# Patient Record
Sex: Female | Born: 1935
Health system: Southern US, Community
[De-identification: ages and names within clinical notes are randomized; demographics above are authoritative.]

## PROBLEM LIST (undated history)

## (undated) DIAGNOSIS — Z950 Presence of cardiac pacemaker: Secondary | ICD-10-CM

## (undated) DIAGNOSIS — M1120 Other chondrocalcinosis, unspecified site: Secondary | ICD-10-CM

## (undated) DIAGNOSIS — R9439 Abnormal result of other cardiovascular function study: Secondary | ICD-10-CM

## (undated) DIAGNOSIS — M8580 Other specified disorders of bone density and structure, unspecified site: Secondary | ICD-10-CM

## (undated) DIAGNOSIS — I10 Essential (primary) hypertension: Secondary | ICD-10-CM

## (undated) DIAGNOSIS — E7800 Pure hypercholesterolemia, unspecified: Secondary | ICD-10-CM

## (undated) DIAGNOSIS — R200 Anesthesia of skin: Secondary | ICD-10-CM

## (undated) DIAGNOSIS — I341 Nonrheumatic mitral (valve) prolapse: Secondary | ICD-10-CM

## (undated) DIAGNOSIS — IMO0002 Reserved for concepts with insufficient information to code with codable children: Secondary | ICD-10-CM

## (undated) DIAGNOSIS — I779 Disorder of arteries and arterioles, unspecified: Secondary | ICD-10-CM

## (undated) DIAGNOSIS — I739 Peripheral vascular disease, unspecified: Secondary | ICD-10-CM

## (undated) DIAGNOSIS — Q282 Arteriovenous malformation of cerebral vessels: Secondary | ICD-10-CM

## (undated) HISTORY — DX: Disorder of arteries and arterioles, unspecified: I77.9

## (undated) HISTORY — DX: Anesthesia of skin: R20.0

## (undated) HISTORY — DX: Abnormal result of other cardiovascular function study: R94.39

## (undated) HISTORY — DX: Reserved for concepts with insufficient information to code with codable children: IMO0002

## (undated) HISTORY — DX: Other chondrocalcinosis, unspecified site: M11.20

## (undated) HISTORY — DX: Other specified disorders of bone density and structure, unspecified site: M85.80

## (undated) HISTORY — DX: Peripheral vascular disease, unspecified: I73.9

## (undated) HISTORY — PX: OTHER SURGICAL HISTORY: SHX169

## (undated) HISTORY — PX: CARPAL TUNNEL RELEASE: SHX101

## (undated) HISTORY — DX: Essential (primary) hypertension: I10

## (undated) HISTORY — DX: Pure hypercholesterolemia, unspecified: E78.00

## (undated) HISTORY — DX: Arteriovenous malformation of cerebral vessels: Q28.2

## (undated) HISTORY — DX: Nonrheumatic mitral (valve) prolapse: I34.1

## (undated) HISTORY — PX: BREAST MASS EXCISION: SHX1267

## (undated) HISTORY — PX: APPENDECTOMY: SHX54

## (undated) HISTORY — PX: TONSILLECTOMY: SUR1361

---

## 1998-04-01 ENCOUNTER — Other Ambulatory Visit: Admission: RE | Admit: 1998-04-01 | Discharge: 1998-04-01 | Payer: Self-pay

## 1999-04-10 ENCOUNTER — Other Ambulatory Visit: Admission: RE | Admit: 1999-04-10 | Discharge: 1999-04-10 | Payer: Self-pay | Admitting: Internal Medicine

## 1999-10-24 ENCOUNTER — Encounter: Admission: RE | Admit: 1999-10-24 | Discharge: 1999-10-24 | Payer: Self-pay | Admitting: Internal Medicine

## 1999-10-24 ENCOUNTER — Encounter: Payer: Self-pay | Admitting: Internal Medicine

## 2000-05-21 ENCOUNTER — Other Ambulatory Visit: Admission: RE | Admit: 2000-05-21 | Discharge: 2000-05-21 | Payer: Self-pay | Admitting: Internal Medicine

## 2000-11-11 ENCOUNTER — Encounter: Admission: RE | Admit: 2000-11-11 | Discharge: 2000-11-11 | Payer: Self-pay | Admitting: Internal Medicine

## 2000-11-11 ENCOUNTER — Encounter: Payer: Self-pay | Admitting: Internal Medicine

## 2001-07-11 ENCOUNTER — Ambulatory Visit (HOSPITAL_BASED_OUTPATIENT_CLINIC_OR_DEPARTMENT_OTHER): Admission: RE | Admit: 2001-07-11 | Discharge: 2001-07-11 | Payer: Self-pay | Admitting: Orthopedic Surgery

## 2001-11-13 ENCOUNTER — Encounter: Admission: RE | Admit: 2001-11-13 | Discharge: 2001-11-13 | Payer: Self-pay | Admitting: Internal Medicine

## 2001-11-13 ENCOUNTER — Encounter: Payer: Self-pay | Admitting: Internal Medicine

## 2002-12-22 ENCOUNTER — Encounter: Admission: RE | Admit: 2002-12-22 | Discharge: 2002-12-22 | Payer: Self-pay | Admitting: Internal Medicine

## 2002-12-22 ENCOUNTER — Encounter: Payer: Self-pay | Admitting: Internal Medicine

## 2004-03-02 ENCOUNTER — Encounter: Admission: RE | Admit: 2004-03-02 | Discharge: 2004-03-02 | Payer: Self-pay | Admitting: Internal Medicine

## 2004-07-17 ENCOUNTER — Ambulatory Visit: Payer: Self-pay | Admitting: Internal Medicine

## 2004-07-27 ENCOUNTER — Ambulatory Visit: Payer: Self-pay | Admitting: Internal Medicine

## 2004-08-18 ENCOUNTER — Encounter (HOSPITAL_COMMUNITY): Admission: RE | Admit: 2004-08-18 | Discharge: 2004-11-16 | Payer: Self-pay | Admitting: Internal Medicine

## 2004-10-03 ENCOUNTER — Ambulatory Visit (HOSPITAL_COMMUNITY): Admission: RE | Admit: 2004-10-03 | Discharge: 2004-10-03 | Payer: Self-pay | Admitting: Surgery

## 2004-12-19 ENCOUNTER — Ambulatory Visit: Admission: RE | Admit: 2004-12-19 | Discharge: 2004-12-19 | Payer: Self-pay | Admitting: Surgery

## 2005-03-06 ENCOUNTER — Encounter (INDEPENDENT_AMBULATORY_CARE_PROVIDER_SITE_OTHER): Payer: Self-pay | Admitting: *Deleted

## 2005-03-06 ENCOUNTER — Ambulatory Visit (HOSPITAL_COMMUNITY): Admission: RE | Admit: 2005-03-06 | Discharge: 2005-03-07 | Payer: Self-pay | Admitting: Surgery

## 2005-04-27 ENCOUNTER — Encounter: Admission: RE | Admit: 2005-04-27 | Discharge: 2005-04-27 | Payer: Self-pay | Admitting: Internal Medicine

## 2006-04-29 ENCOUNTER — Encounter: Admission: RE | Admit: 2006-04-29 | Discharge: 2006-04-29 | Payer: Self-pay | Admitting: Internal Medicine

## 2006-05-06 ENCOUNTER — Encounter: Admission: RE | Admit: 2006-05-06 | Discharge: 2006-05-06 | Payer: Self-pay | Admitting: Internal Medicine

## 2006-05-14 ENCOUNTER — Encounter: Admission: RE | Admit: 2006-05-14 | Discharge: 2006-05-14 | Payer: Self-pay | Admitting: Internal Medicine

## 2006-05-14 ENCOUNTER — Encounter (INDEPENDENT_AMBULATORY_CARE_PROVIDER_SITE_OTHER): Payer: Self-pay | Admitting: Specialist

## 2006-08-13 ENCOUNTER — Ambulatory Visit (HOSPITAL_BASED_OUTPATIENT_CLINIC_OR_DEPARTMENT_OTHER): Admission: RE | Admit: 2006-08-13 | Discharge: 2006-08-13 | Payer: Self-pay | Admitting: General Surgery

## 2006-08-13 ENCOUNTER — Encounter (INDEPENDENT_AMBULATORY_CARE_PROVIDER_SITE_OTHER): Payer: Self-pay | Admitting: *Deleted

## 2007-02-12 ENCOUNTER — Encounter: Admission: RE | Admit: 2007-02-12 | Discharge: 2007-02-12 | Payer: Self-pay | Admitting: Internal Medicine

## 2007-02-13 ENCOUNTER — Ambulatory Visit: Payer: Self-pay | Admitting: *Deleted

## 2007-05-12 ENCOUNTER — Encounter: Admission: RE | Admit: 2007-05-12 | Discharge: 2007-05-12 | Payer: Self-pay | Admitting: Internal Medicine

## 2007-10-25 ENCOUNTER — Encounter: Admission: RE | Admit: 2007-10-25 | Discharge: 2007-10-25 | Payer: Self-pay | Admitting: Neurosurgery

## 2008-02-04 ENCOUNTER — Ambulatory Visit: Payer: Self-pay | Admitting: Vascular Surgery

## 2008-05-12 ENCOUNTER — Encounter: Admission: RE | Admit: 2008-05-12 | Discharge: 2008-05-12 | Payer: Self-pay | Admitting: Internal Medicine

## 2009-05-13 ENCOUNTER — Encounter: Admission: RE | Admit: 2009-05-13 | Discharge: 2009-05-13 | Payer: Self-pay | Admitting: Internal Medicine

## 2009-07-19 ENCOUNTER — Encounter (INDEPENDENT_AMBULATORY_CARE_PROVIDER_SITE_OTHER): Payer: Self-pay | Admitting: *Deleted

## 2010-02-01 ENCOUNTER — Encounter (INDEPENDENT_AMBULATORY_CARE_PROVIDER_SITE_OTHER): Payer: Self-pay | Admitting: *Deleted

## 2010-03-10 ENCOUNTER — Encounter (INDEPENDENT_AMBULATORY_CARE_PROVIDER_SITE_OTHER): Payer: Self-pay | Admitting: *Deleted

## 2010-03-14 ENCOUNTER — Ambulatory Visit: Payer: Self-pay | Admitting: Internal Medicine

## 2010-03-23 ENCOUNTER — Ambulatory Visit: Payer: Self-pay | Admitting: Internal Medicine

## 2010-05-15 ENCOUNTER — Encounter: Admission: RE | Admit: 2010-05-15 | Discharge: 2010-05-15 | Payer: Self-pay | Admitting: Internal Medicine

## 2010-07-02 ENCOUNTER — Encounter: Payer: Self-pay | Admitting: Internal Medicine

## 2010-07-11 NOTE — Procedures (Signed)
Summary: Colonoscopy  Patient: [name] Note: All result statuses are Final unless otherwise noted.  Tests: (1) Colonoscopy (COL)   COL Colonoscopy           DONE     [facility]     [address]     [place], [place]  [address]           COLONOSCOPY PROCEDURE REPORT           PATIENT:  [name], [name]  MR#:  [id number]     BIRTHDATE:  [birth year], 73 yrs. old  GENDER:  female     ENDOSCOPIST:  Wilhemina Bonito. Eda Keys, MD     REF. BY:  Surveillance Program Recall,     PROCEDURE DATE:  03/23/2010     PROCEDURE:  Surveillance Colonoscopy     ASA CLASS:  Class II     INDICATIONS:  history of pre-cancerous (adenomatous) colon polyps,     surveillance and high-risk screening ; index exam 07-2004 w/ small     adenoma     MEDICATIONS:   Fentanyl 75 mcg IV, Versed 7 mg IV           DESCRIPTION OF PROCEDURE:   After the risks benefits and     alternatives of the procedure were thoroughly explained, informed     consent was obtained.  Digital rectal exam was performed and     revealed moderate external hemorrhoids.   The LB 180AL [id number]     endoscope was introduced through the anus and advanced to the     cecum, which was identified by both the appendix and ileocecal     valve, without limitations.time to cecum  = 4:53 min.  The quality     of the prep was excellent, using MoviPrep.  The instrument was     then slowly withdrawn (time = 8:59 min) as the colon was fully     examined.     <<PROCEDUREIMAGES>>           FINDINGS:  Mild diverticulosis was found in the sigmoid colon.     This was otherwise a normal examination of the colon.  No polyps or     cancers were seen.   Retroflexed views in the rectum revealed     internal hemorrhoids.    The scope was then withdrawn from the     patient and the procedure completed.           COMPLICATIONS:  None     ENDOSCOPIC IMPRESSION:     1) Mild diverticulosis in the sigmoid colon     2) Otherwise normal examination     3) No  polyps or cancers     4) Internal hemorrhoids           RECOMMENDATIONS:     1) Follow up colonoscopy in 5 years           ______________________________     Wilhemina Bonito. Eda Keys, MD           CC:  Geoffry Paradise, MD; The Patient           n.     eSIGNED:   Wilhemina Bonito. Eda Keys at 03/23/2010 09:49 AM           Bertram Denver, [id number]  Note: An exclamation mark (!) indicates a result that was not dispersed into the flowsheet. Document Creation Date: 03/23/2010 9:50 AM _______________________________________________________________________  (1) Order result status: Final Collection  or observation date-time: 03/23/2010 09:44 Requested date-time:  Receipt date-time:  Reported date-time:  Referring Physician:   Ordering Physician: Fransico Setters [phone]) Specimen Source:  Source: Launa Grill Order Number: [id number] Lab site:   Appended Document: Colonoscopy    Clinical Lists Changes  Observations: Added new observation of COLONNXTDUE: 10.2016  (03/23/2010 10:21)

## 2010-07-11 NOTE — Letter (Signed)
Summary: Colonoscopy Letter  [facility]  [address] [place], [place] [address]   Phone: [phone]  Fax: [phone]      July 19, 2009 MRN: [id number]   [name] [address] [place], [place]  [address]   Dear Ms. Kapler,   According to your medical record, it is time for you to schedule a Colonoscopy. The American Cancer Society recommends this procedure as a method to detect early colon cancer. Patients with a family history of colon cancer, or a personal history of colon polyps or inflammatory bowel disease are at increased risk.  This letter has beeen generated based on the recommendations made at the time of your procedure. If you feel that in your particular situation this may no longer apply, please contact our office.  Please call our office at [phone] to schedule this appointment or to update your records at your earliest convenience.  Thank you for cooperating with [place] to provide you with the very best care possible.   Sincerely,  Wilhemina Bonito. Marina Goodell, M.D.  [facility] [phone]

## 2010-07-11 NOTE — Letter (Signed)
Summary: Previsit letter  [facility]  [address] [place], [place] [address]   Phone: [phone]  Fax: [phone]       02/01/2010 MRN: [id number]  [name] [address] [place], [place]  [address]  Dear [name],  Welcome to the Gastroenterology Division at [facility].    You are scheduled to see a nurse for your pre-procedure visit on 03/14/2010 at 10:00am on the 3rd floor at [facility], [address]  We ask that you try to arrive at our office 15 minutes prior to your appointment time to allow for check-in.  Your nurse visit will consist of discussing your medical and surgical history, your immediate family medical history, and your medications.    Please bring a complete list of all your medications or, if you prefer, bring the medication bottles and we will list them.  We will need to be aware of both prescribed and over the counter drugs.  We will need to know exact dosage information as well.  If you are on blood thinners (Coumadin, Plavix, Aggrenox, Ticlid, etc.) please call our office today/prior to your appointment, as we need to consult with your physician about holding your medication.   Please be prepared to read and sign documents such as consent forms, a financial agreement, and acknowledgement forms.  If necessary, and with your consent, a friend or relative is welcome to sit-in on the nurse visit with you.  Please bring your insurance card so that we may make a copy of it.  If your insurance requires a referral to see a specialist, please bring your referral form from your primary care physician.  No co-pay is required for this nurse visit.     If you cannot keep your appointment, please call [phone] to cancel or reschedule prior to your appointment date.  This allows [place] the opportunity to schedule an appointment for another patient in need of care.    Thank you for choosing [facility] for your medical  needs.  We appreciate the opportunity to care for you.  Please visit [place] at our website  to learn more about our practice.                     Sincerely.                                                                                                                   The Gastroenterology Division

## 2010-07-11 NOTE — Letter (Signed)
Summary: [facility] Instructions  [facility]  [address] [place], [place] [address]   Phone: [phone]  Fax: [phone]       KECHIA YAHNKE    1935/08/29    MRN: [id number]        Procedure Day /Date: Thursday 03/23/2010     Arrival Time: 8:00 am      Procedure Time: 9:00 am     Location of Procedure:                    _x _  [facility] (4th Floor)                        PREPARATION FOR COLONOSCOPY WITH MOVIPREP   Starting 5 days prior to your procedure Saturday 10/8 do not eat nuts, seeds, popcorn, corn, beans, peas,  salads, or any raw vegetables.  Do not take any fiber supplements (e.g. Metamucil, Citrucel, and Benefiber).  THE DAY BEFORE YOUR PROCEDURE         DATE: Wednesday 10/12  1.  Drink clear liquids the entire day-NO SOLID FOOD  2.  Do not drink anything colored red or purple.  Avoid juices with pulp.  No orange juice.  3.  Drink at least 64 oz. (8 glasses) of fluid/clear liquids during the day to prevent dehydration and help the prep work efficiently.  CLEAR LIQUIDS INCLUDE: Water Jello Ice Popsicles Tea (sugar ok, no milk/cream) Powdered fruit flavored drinks Coffee (sugar ok, no milk/cream) Gatorade Juice: apple, white grape, white cranberry  Lemonade Clear bullion, consomm, broth Carbonated beverages (any kind) Strained chicken noodle soup Hard Candy                             4.  In the morning, mix first dose of MoviPrep solution:    Empty 1 Pouch A and 1 Pouch B into the disposable container    Add lukewarm drinking water to the top line of the container. Mix to dissolve    Refrigerate (mixed solution should be used within 24 hrs)  5.  Begin drinking the prep at 5:00 p.m. The MoviPrep container is divided by 4 marks.   Every 15 minutes drink the solution down to the next mark (approximately 8 oz) until the full liter is complete.   6.  Follow completed prep with 16 oz of clear liquid of your choice  (Nothing red or purple).  Continue to drink clear liquids until bedtime.  7.  Before going to bed, mix second dose of MoviPrep solution:    Empty 1 Pouch A and 1 Pouch B into the disposable container    Add lukewarm drinking water to the top line of the container. Mix to dissolve    Refrigerate  THE DAY OF YOUR PROCEDURE      DATE: Thursday 10/13  Beginning at 4:00 a.m. (5 hours before procedure):         1. Every 15 minutes, drink the solution down to the next mark (approx 8 oz) until the full liter is complete.  2. Follow completed prep with 16 oz. of clear liquid of your choice.    3. You may drink clear liquids until 7:00 am (2 HOURS BEFORE PROCEDURE).   MEDICATION INSTRUCTIONS  Unless otherwise instructed, you should take regular prescription medications with a small sip of water   as early as possible the morning of your  procedure.           OTHER INSTRUCTIONS  You will need a responsible adult at least 73 years of age to accompany you and drive you home.   This person must remain in the waiting room during your procedure.  Wear loose fitting clothing that is easily removed.  Leave jewelry and other valuables at home.  However, you may wish to bring a book to read or  an iPod/MP3 player to listen to music as you wait for your procedure to start.  Remove all body piercing jewelry and leave at home.  Total time from sign-in until discharge is approximately 2-3 hours.  You should go home directly after your procedure and rest.  You can resume normal activities the  day after your procedure.  The day of your procedure you should not:   Drive   Make legal decisions   Operate machinery   Drink alcohol   Return to work  You will receive specific instructions about eating, activities and medications before you leave.    The above instructions have been reviewed and explained to me by   Ezra Sites RN  March 14, 2010 10:45 AM     I fully understand  and can verbalize these instructions _____________________________ Date _________

## 2010-07-11 NOTE — Miscellaneous (Signed)
Summary: [facility]  Clinical Lists Changes  Medications: Added new medication of MOVIPREP 100 GM  SOLR (PEG-KCL-NACL-NASULF-NA ASC-C) As per prep instructions. - Signed Rx of MOVIPREP 100 GM  SOLR (PEG-KCL-NACL-NASULF-NA ASC-C) As per prep instructions.;  #1 x 0;  Signed;  Entered by: Ezra Sites RN;  Authorized by: Hilarie Fredrickson MD;  Method used: [facility] [address] [phone]*, [address], [place], [place], [place]  [address], Ph: [phone] or [phone], Fax: [phone] Allergies: Added new allergy or adverse reaction of PCN Added new allergy or adverse reaction of * PRAVASTATIN Added new allergy or adverse reaction of * ALEVE Observations: Added new observation of NKA: F (03/14/2010 9:50)    Prescriptions: MOVIPREP 100 GM  SOLR (PEG-KCL-NACL-NASULF-NA ASC-C) As per prep instructions.  #1 x 0   Entered by:   Ezra Sites RN   Authorized by:   Hilarie Fredrickson MD   Signed by:   Ezra Sites RN on 03/14/2010   Method used:   [facility]ron[facility]        [facility] [address] [phone]* (retail)       [address]       [place], [place]  [address]       Ph: [phone] or [phone]       Fax: [phone]   RxID:   [id number]

## 2010-08-30 ENCOUNTER — Other Ambulatory Visit: Payer: Self-pay | Admitting: *Deleted

## 2010-08-30 DIAGNOSIS — I10 Essential (primary) hypertension: Secondary | ICD-10-CM

## 2010-08-30 MED ORDER — NEBIVOLOL HCL 10 MG PO TABS: 10.0000 mg | ORAL_TABLET | Freq: Every day | ORAL | Status: DC

## 2010-10-23 ENCOUNTER — Encounter: Payer: Self-pay | Admitting: Internal Medicine

## 2010-10-24 NOTE — Procedures (Signed)
CAROTID DUPLEX EXAM   INDICATION:  Check bruits, the patient is scheduled for knee surgery.   HISTORY:  Diabetes:  No.  Cardiac:  No.  Hypertension:  No.  Smoking:  Previous.  Previous Surgery:  No.  CV History:  Amaurosis Fugax No, Paresthesias No, Hemiparesis No                                       RIGHT             LEFT  Brachial systolic pressure:         160               156  Brachial Doppler waveforms:         Within normal limits                Within normal limits  Vertebral direction of flow:        Antegrade         Antegrade  DUPLEX VELOCITIES (cm/sec)  CCA peak systolic                   100               92  ECA peak systolic                   405               340  ICA peak systolic                   168               133  ICA end diastolic                   53                50  PLAQUE MORPHOLOGY:                  Calcific          Calcific  PLAQUE AMOUNT:                      Moderate          Moderate  PLAQUE LOCATION:                    ICA/ECA           ICA/ECA   IMPRESSION:  1. 40-59% stenosis of the bilateral internal carotid arteries.  2. Bilateral external carotid artery stenoses noted.  3. Preliminary report was faxed to Dr. Lanell Matar office at 9:45 on      02/04/2008.   ___________________________________________  Larina Earthly, M.D.   CH/MEDQ  D:  02/04/2008  T:  02/04/2008  Job:  [id number]

## 2010-10-27 NOTE — Op Note (Signed)
NAMEZIERRA, LAROQUE              ACCOUNT NO.:  [id number]   MEDICAL RECORD NO.:  [id number]          PATIENT TYPE:  AMB   LOCATION:  DSC                          FACILITY:  [facility]   PHYSICIAN:  Rose Phi. Maple Hudson, M.D.   DATE OF BIRTH:  1935/11/03   DATE OF PROCEDURE:  08/13/2006  DATE OF DISCHARGE:                               OPERATIVE REPORT   PREOPERATIVE DIAGNOSIS:  Right breast mass.   POSTOPERATIVE DIAGNOSIS:  Right breast mass.   OPERATION:  Excision of right breast mass.   SURGEON:  Rose Phi. Maple Hudson, M.D.   ANESTHESIA:  MAC.   OPERATIVE PROCEDURE:  This patient has had previous core biopsy showing  fibrocystic change with some calcifications in what has now turned into  a palpable mass at the 8 o'clock position of the right breast.   The patient placed on the operating table with the arms extended on the  arm board.  The right breast prepped and draped in the usual fashion.  A  curved incision overlying the palpable nodule at about the 8 o'clock  position of the right breast was then outlined with a marking pencil and  the area thoroughly infiltrated with local anesthetic.   Incision was made and the mass and surrounding tissue were excised.  Hemostasis obtained with cautery.  The deeper breast tissue was  approximated with 3-0 Vicryl and the skin with a subcuticular 4-0  Monocryl and then an application of Dermabond.  After it had dried a  light dressing was applied and she was transferred to the recovery room  in satisfactory condition having tolerated procedure well.      Rose Phi. Maple Hudson, M.D.  Electronically Signed     PRY/MEDQ  D:  08/13/2006  T:  08/13/2006  Job:  [id number]

## 2010-10-27 NOTE — Op Note (Signed)
[name], [name] NO.:  [id number]   MEDICAL RECORD NO.:  [id number]          PATIENT TYPE:  INP   LOCATION:  5705                         FACILITY:  [facility]   PHYSICIAN:  Velora Heckler, MD      DATE OF BIRTH:  [birth year]   DATE OF PROCEDURE:  03/06/2005  DATE OF DISCHARGE:                                 OPERATIVE REPORT   PREOPERATIVE DIAGNOSIS:  Primary hyperparathyroidism.   POSTOPERATIVE DIAGNOSES:  1.  Primary hyperparathyroidism.  2.  Right thyroid nodule.   PROCEDURE:  1.  Resection, ectopic left parathyroid adenoma.  2.  Excisional biopsy, right thyroid nodule.   SURGEON:  Velora Heckler, MD.   ASSISTANT:  Claud Kelp, MD   ANESTHESIA:  General.   ESTIMATED BLOOD LOSS:  Minimal.   PREPARATION:  Betadine.   COMPLICATIONS:  None.   INDICATIONS:  The patient is a 75 year old white female from [place],  [place] who presents at the request of Dr. Geoffry Little with  probable primary hyperparathyroidism.  The patient has elevated serum  calcium and parathyroid hormone levels.  She has had problems with  osteopenia and bone and joint pain.  The patient underwent sestamibi scan  which showed increased activity in the upper mediastinum beneath the  manubrium.  MRI scan was performed which documented a 17-mm density in the  anterior mediastinum consistent with parathyroid adenoma.  Also noted was an  inferior right thyroid nodule.  The patient now comes to surgery for neck  exploration.   BODY OF REPORT.:  Procedure is done in OR #1 at the [facility].  The patient is brought to the operating room and placed in a  supine position on the operating room table.  Following the administration  of general anesthesia, the patient is positioned and then prepped and draped  in the usual strict aseptic fashion.  After ascertaining that an adequate  level of anesthesia had been obtained, a transverse anterior neck  incision  was made with a #15 blade.  Dissection was carried down through subcutaneous  tissues and platysma.  Hemostasis was obtained with the electrocautery.  Skin flaps are developed cephalad and caudad.  The strap muscles are then  incised in the midline and dissection carried down to the trachea.  Dissection is carried beneath the manubrium.  Venous tributaries are divided  between medium Ligaclips.  Adipose tissue surrounding the thymus is  dissected out.  It is opened.  Just to the left side of midline, there is a  brownish-black-colored density within the adipose tissue.  This is gently  mobilized.  Vascular tributaries are divided between small and medium  Ligaclips.  Mass is completely excised from the left paratracheal space.  It  has the appearance of a parathyroid adenoma with large dark venous lakes.  It is submitted in its entirety to Pathology.  Dr. Esther Hardy performed  frozen section analysis and confirms parathyroid tissue.  Good hemostasis is  obtained and a small piece of Surgicel is placed in the area of the  resection.   Continuing in  the right neck, strap muscles are reflected laterally.  Right  inferior pole of the thyroid is exposed.  There is approximately a 1.5-cm  nodular density in the inferior right thyroid lobe.  This is gently  mobilized.  Vascular tributaries are divided between medium Ligaclips.  The  thyroid parenchyma is transected between 2-0 silk ligatures.  The nodule is  completely excised off the trachea and submitted to Pathology.  Dr. Esther Hardy also reviewed this on frozen section and felt this was most likely  compatible with hyperplastic thyroid nodule.  Good hemostasis was obtained  and Surgicel was placed in the resection bed.  Strap muscles were  reapproximated in the midline with interrupted 3-0 Vicryl sutures.  Platysma  was closed with interrupted 3-0 Vicryl sutures.  Skin was closed with  running 4-0 Vicryl subcuticular suture.   Wound was washed and dried and  Benzoin and Steri-Strips were applied.  Sterile dressings were applied.  The  patient was awakened from anesthesia and brought to the recovery room in  stable condition.  The patient tolerated the procedure well.      Velora Heckler, MD  Electronically Signed     TMG/MEDQ  D:  03/06/2005  T:  03/07/2005  Job:  [id number]   cc:   [name], M.D.  Fax: [phone]

## 2010-10-27 NOTE — Op Note (Signed)
[place]  Patient:    [name], [name] Visit Number: [id number] MRN: [id number]          Service Type: DSU Location: [facility] Attending Physician:  Susa Day Dictated by:   Katy Fitch Naaman Plummer., M.D. Proc. Date: 07/11/01 Admit Date:  07/11/2001                             Operative Report  PREOPERATIVE DIAGNOSIS:  Entrapment neuropathy, median nerve left carpal tunnel.  POSTOPERATIVE DIAGNOSIS:  Entrapment neuropathy, media nerve left carpal tunnel.  PROCEDURE:  Release of left transverse carpal ligament.  SURGEON:  Katy Fitch. Sypher, Montez Hageman., M.D.  ASSISTANT:  Jonni Sanger, P.A.  ANESTHESIA:  General by LMA supervised by the anesthesiologist, Cliffton Asters. Ivin Booty, M.D.  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  Electrodiagnostic studies have confirmed median neuropathy at the carpal tunnel.  After informed consent and after a failure to respond to nonoperative measures, she is brought to the operating room at this time for release of the left transverse carpal ligament.  DESCRIPTION OF PROCEDURE:  [name] is brought to the operating room and placed in the supine position on the operating table.  Following induction of general anesthesia by LMA, the left arm was prepped with Betadine soap and solution and sterilely draped.  Following exsanguination of the limb with an Esmarch bandage, an arterial tourniquet was placed to 220 mmHg.  The procedure commenced with a short incision in the line of the ring finger in the palm. Subcutaneous tissues were carefully divided to reveal the palmar fascia.  This was split longitudinally to reveal the common sensory branch of the median nerve.  The ligament was then isolated and released on its ulnar border, extending into the distal forearm.  This widely opened  the carpal canal.  No masses or other predicaments were noted.  Bleeding points along the margins of the released ligament were electrocauterized with bipolar current, followed by repair of the skin with intradermal 3-0 Prolene suture.  A compressive dressing was applied with a volar plaster splint maintaining the wrist in 5 degrees of dorsiflexion. Dictated by:   Katy Fitch Naaman Plummer., M.D. Attending Physician:  Susa Day DD:  07/11/01 TD:  07/12/01 Job: [id number]

## 2010-10-30 ENCOUNTER — Encounter: Payer: Self-pay | Admitting: Internal Medicine

## 2010-11-03 ENCOUNTER — Ambulatory Visit: Payer: Self-pay | Admitting: Cardiology

## 2010-11-07 ENCOUNTER — Encounter: Payer: Self-pay | Admitting: Cardiology

## 2010-11-08 ENCOUNTER — Encounter: Payer: Self-pay | Admitting: Cardiology

## 2010-11-08 ENCOUNTER — Ambulatory Visit (INDEPENDENT_AMBULATORY_CARE_PROVIDER_SITE_OTHER): Payer: Medicare Other | Admitting: Cardiology

## 2010-11-08 DIAGNOSIS — R9439 Abnormal result of other cardiovascular function study: Secondary | ICD-10-CM | POA: Insufficient documentation

## 2010-11-08 DIAGNOSIS — E7800 Pure hypercholesterolemia, unspecified: Secondary | ICD-10-CM | POA: Insufficient documentation

## 2010-11-08 DIAGNOSIS — I779 Disorder of arteries and arterioles, unspecified: Secondary | ICD-10-CM

## 2010-11-08 DIAGNOSIS — I059 Rheumatic mitral valve disease, unspecified: Secondary | ICD-10-CM

## 2010-11-08 DIAGNOSIS — I10 Essential (primary) hypertension: Secondary | ICD-10-CM | POA: Insufficient documentation

## 2010-11-08 DIAGNOSIS — I341 Nonrheumatic mitral (valve) prolapse: Secondary | ICD-10-CM | POA: Insufficient documentation

## 2010-11-08 NOTE — Patient Instructions (Signed)
Continue your current therapy.  Stay active.   I will see you back in one year.

## 2010-11-08 NOTE — Assessment & Plan Note (Signed)
Recent carotid Dopplers showed no significant progression of disease.

## 2010-11-08 NOTE — Progress Notes (Signed)
Candie Echevaria Date of Birth: 1935/12/01   History of Present Illness: Mrs. Demary is seen for yearly followup. She states she has done very well this past year. Her blood pressures at home have been well controlled. She did have an upper respiratory infection about 6 weeks ago but this has resolved. She denies any chest pain or shortness of breath. She also reports she had followup carotid Doppler studies which demonstrated no significant change. She denies any chest pain or shortness of breath.  Current Outpatient Prescriptions on File Prior to Visit  Medication Sig Dispense Refill  . Calcium Carbonate-Vitamin D (CALTRATE 600+D PO) Take by mouth 2 (two) times daily.        . fish oil-omega-3 fatty acids 1000 MG capsule Take by mouth 2 (two) times daily. ( 1200 mg )      . glucosamine-chondroitin 500-400 MG tablet Take 1 tablet by mouth 2 (two) times daily.       . Ibuprofen (ADVIL PO) Take by mouth as needed.        . mometasone (ELOCON) 0.1 % cream Apply 1 application topically as needed.        . Multiple Vitamin (MULTIVITAMIN) tablet Take 1 tablet by mouth daily.        . nebivolol (BYSTOLIC) 10 MG tablet Take 1 tablet (10 mg total) by mouth daily.  90 tablet  3  . PHENobarbital (LUMINAL) 32.4 MG tablet Take 32.4 mg by mouth 2 (two) times daily.        . phenytoin (DILANTIN) 100 MG ER capsule Take 100 mg by mouth 2 (two) times daily.        . simvastatin (ZOCOR) 20 MG tablet Take 20 mg by mouth at bedtime.        Marland Kitchen VITAMIN D, ERGOCALCIFEROL, PO Take by mouth once a week.          Allergies  Allergen Reactions  . Naproxen Sodium     REACTION: rash  . Penicillins     REACTION: hives  . Pravastatin   . Sulfa Drugs Cross Reactors     Past Medical History  Diagnosis Date  . Numbness     LEFT FOOT AND ANKLE  . Hypertension   . Hypercholesterolemia   . Carotid arterial disease     MODERATE RIGHT  . Cerebral AV malformation   . DDD (degenerative disc disease)     SEVERE  WITH SPINAL STENOSIS  . Osteopenia   . Pseudo-gout   . MVP (mitral valve prolapse)     MILD    Past Surgical History  Procedure Date  . Carpal tunnel release   . Appendectomy   . Tonsillectomy   . Removal of parathyroid gland   . Breast mass excision   . Stress nuclear study     ABNORMAL    History  Smoking status  . Former Smoker -- 1.0 packs/day for 14 years  . Types: Cigarettes  . Quit date: 11/07/1968  Smokeless tobacco  . Never Used    History  Alcohol Use No    Family History  Problem Relation Age of Onset  . Stroke Mother   . Lung cancer Father     Review of Systems: The review of systems is positive for the fracture toe. She had an upper respiratory infection with persistent cough that is now resolved..  All other systems were reviewed and are negative.  Physical Exam: BP 148/60  Pulse 74  Ht 5\' 6"  (1.676 m)  Wt 148 lb (67.132 kg)  BMI 23.89 kg/m2 She is a pleasant elderly female in no acute distress. HEENT exam is unremarkable. She has no JVD. She has bilateral carotid bruits. Lungs are clear. Cardiac exam reveals a grade 1-2/6 systolic murmur at the left sternal border. Abdomen is soft and nontender. She has no edema. She has excellent pedal pulses. LABORATORY DATA: ECG dated Oct 30, 2010 shows normal sinus rhythm with an incomplete right bundle branch block. There are T wave inversions in leads V1 through V3. Chemistry panel and CBC were remarkable for hemoglobin of 11. Total cholesterol 229, triglycerides 56, HDL 95, and LDL 123. TSH was normal. Apolipoprotein B. Was 65.  Assessment / Plan:

## 2010-11-08 NOTE — Assessment & Plan Note (Signed)
Blood pressure is mildly elevated today but the readings at home have been acceptable. We will continue with her current therapy.

## 2010-11-08 NOTE — Assessment & Plan Note (Signed)
Stress Cardiolite study in April 2010 showed a small reversible anterior wall defect. She had normal ejection fraction of 66%. She has remained asymptomatic so we have not pursued further testing. We will continue with risk factor modification. She will remain on her beta blocker and statin therapy.

## 2011-01-19 ENCOUNTER — Telehealth: Payer: Self-pay | Admitting: Cardiology

## 2011-01-19 NOTE — Telephone Encounter (Signed)
Waiting for release of information to be faxed today per representative

## 2011-01-19 NOTE — Telephone Encounter (Signed)
Representative is asking for last 5 years of records for [name] calling from [employer], Chart pulled and placed on [employer] to be picked up on next Tuesday

## 2011-01-19 NOTE — Telephone Encounter (Signed)
Received release, placed on [employer] Desk to be picked up on Tuesday

## 2011-04-16 ENCOUNTER — Other Ambulatory Visit: Payer: Self-pay | Admitting: Internal Medicine

## 2011-04-16 DIAGNOSIS — Z1231 Encounter for screening mammogram for malignant neoplasm of breast: Secondary | ICD-10-CM

## 2011-05-17 ENCOUNTER — Ambulatory Visit
Admission: RE | Admit: 2011-05-17 | Discharge: 2011-05-17 | Disposition: A | Discharge status: Home / Self Care | Payer: Medicare Other | Source: Ambulatory Visit | Attending: Internal Medicine | Admitting: Internal Medicine

## 2011-05-17 DIAGNOSIS — Z1231 Encounter for screening mammogram for malignant neoplasm of breast: Secondary | ICD-10-CM

## 2011-08-30 DIAGNOSIS — I10 Essential (primary) hypertension: Secondary | ICD-10-CM | POA: Diagnosis not present

## 2011-08-30 DIAGNOSIS — L0201 Cutaneous abscess of face: Secondary | ICD-10-CM | POA: Diagnosis not present

## 2011-08-30 DIAGNOSIS — L03211 Cellulitis of face: Secondary | ICD-10-CM | POA: Diagnosis not present

## 2011-09-03 ENCOUNTER — Telehealth: Payer: Self-pay | Admitting: Cardiology

## 2011-09-03 DIAGNOSIS — I10 Essential (primary) hypertension: Secondary | ICD-10-CM

## 2011-09-03 NOTE — Telephone Encounter (Signed)
Pt needs  rx called [facility] bystolic

## 2011-09-04 MED ORDER — NEBIVOLOL HCL 10 MG PO TABS: 10.0000 mg | ORAL_TABLET | Freq: Every day | ORAL | Status: DC

## 2011-09-27 ENCOUNTER — Other Ambulatory Visit: Payer: Self-pay | Admitting: Dermatology

## 2011-09-27 DIAGNOSIS — D210 Benign neoplasm of connective and other soft tissue of head, face and neck: Secondary | ICD-10-CM | POA: Diagnosis not present

## 2011-09-27 DIAGNOSIS — L819 Disorder of pigmentation, unspecified: Secondary | ICD-10-CM | POA: Diagnosis not present

## 2011-09-27 DIAGNOSIS — D485 Neoplasm of uncertain behavior of skin: Secondary | ICD-10-CM | POA: Diagnosis not present

## 2011-09-27 DIAGNOSIS — Z85820 Personal history of malignant melanoma of skin: Secondary | ICD-10-CM | POA: Diagnosis not present

## 2011-09-27 DIAGNOSIS — L570 Actinic keratosis: Secondary | ICD-10-CM | POA: Diagnosis not present

## 2011-09-27 DIAGNOSIS — L821 Other seborrheic keratosis: Secondary | ICD-10-CM | POA: Diagnosis not present

## 2011-10-30 ENCOUNTER — Encounter: Payer: Self-pay | Admitting: Cardiology

## 2011-10-30 DIAGNOSIS — M899 Disorder of bone, unspecified: Secondary | ICD-10-CM | POA: Diagnosis not present

## 2011-10-30 DIAGNOSIS — I10 Essential (primary) hypertension: Secondary | ICD-10-CM | POA: Diagnosis not present

## 2011-10-30 DIAGNOSIS — E042 Nontoxic multinodular goiter: Secondary | ICD-10-CM | POA: Diagnosis not present

## 2011-10-30 DIAGNOSIS — M949 Disorder of cartilage, unspecified: Secondary | ICD-10-CM | POA: Diagnosis not present

## 2011-10-30 DIAGNOSIS — E785 Hyperlipidemia, unspecified: Secondary | ICD-10-CM | POA: Diagnosis not present

## 2011-11-06 DIAGNOSIS — E042 Nontoxic multinodular goiter: Secondary | ICD-10-CM | POA: Diagnosis not present

## 2011-11-06 DIAGNOSIS — I10 Essential (primary) hypertension: Secondary | ICD-10-CM | POA: Diagnosis not present

## 2011-11-06 DIAGNOSIS — E785 Hyperlipidemia, unspecified: Secondary | ICD-10-CM | POA: Diagnosis not present

## 2011-11-06 DIAGNOSIS — Z0000 Encounter for general adult medical examination without abnormal findings: Secondary | ICD-10-CM | POA: Diagnosis not present

## 2011-11-07 DIAGNOSIS — Z1212 Encounter for screening for malignant neoplasm of rectum: Secondary | ICD-10-CM | POA: Diagnosis not present

## 2011-11-12 ENCOUNTER — Ambulatory Visit (INDEPENDENT_AMBULATORY_CARE_PROVIDER_SITE_OTHER): Payer: Medicare Other | Admitting: Cardiology

## 2011-11-12 ENCOUNTER — Encounter: Payer: Self-pay | Admitting: Cardiology

## 2011-11-12 VITALS — BP 161/74 | HR 78 | Ht 66.0 in | Wt 147.4 lb

## 2011-11-12 DIAGNOSIS — I10 Essential (primary) hypertension: Secondary | ICD-10-CM | POA: Diagnosis not present

## 2011-11-12 DIAGNOSIS — R9439 Abnormal result of other cardiovascular function study: Secondary | ICD-10-CM

## 2011-11-12 DIAGNOSIS — E785 Hyperlipidemia, unspecified: Secondary | ICD-10-CM

## 2011-11-12 DIAGNOSIS — I779 Disorder of arteries and arterioles, unspecified: Secondary | ICD-10-CM

## 2011-11-12 NOTE — Assessment & Plan Note (Signed)
She still remains asymptomatic. While her prior stress test was mildly abnormal it was low risk. We will continue with medical management unless she develops symptoms.

## 2011-11-12 NOTE — Progress Notes (Signed)
[name] Date of Birth: 1936-02-13   History of Present Illness: [name] is seen for yearly followup. She reports that she has done well this past year. She did have a loss in the family that has been very stressful for her this year. Her biggest problem is of chronic back pain. She's had no TIA or CVA symptoms. She denies any chest pain or shortness of breath. She has a history of mildly abnormal cardiovascular stress test in April 2010. This showed a small reversible anterior wall defect. Ejection fraction was normal. She also has a history of some carotid arterial disease but this has been stable.  Current Outpatient Prescriptions on File Prior to Visit  Medication Sig Dispense Refill  . Calcium Carbonate-Vitamin D (CALTRATE 600+D PO) Take by mouth 2 (two) times daily.        . fish oil-omega-3 fatty acids 1000 MG capsule Take by mouth 2 (two) times daily. ( 1200 mg )      . glucosamine-chondroitin 500-400 MG tablet Take 1 tablet by mouth daily.       . Ibuprofen (ADVIL PO) Take by mouth as needed.        . mometasone (ELOCON) 0.1 % cream Apply 1 application topically as needed.        . Multiple Vitamin (MULTIVITAMIN) tablet Take 1 tablet by mouth daily.        . nebivolol (BYSTOLIC) 10 MG tablet Take 1 tablet (10 mg total) by mouth daily.  90 tablet  3  . PHENobarbital (LUMINAL) 32.4 MG tablet Take 32.4 mg by mouth 2 (two) times daily.        . phenytoin (DILANTIN) 100 MG ER capsule Take 100 mg by mouth 2 (two) times daily.        . simvastatin (ZOCOR) 20 MG tablet Take 20 mg by mouth at bedtime.          Allergies  Allergen Reactions  . Naproxen Sodium     REACTION: rash  . Penicillins     REACTION: hives  . Pravastatin   . Sulfa Drugs Cross Reactors     Past Medical History  Diagnosis Date  . Numbness     LEFT FOOT AND ANKLE  . Hypertension   . Hypercholesterolemia   . Carotid arterial disease     MODERATE RIGHT  . Cerebral AV malformation   . DDD  (degenerative disc disease)     SEVERE WITH SPINAL STENOSIS  . Osteopenia   . Pseudo-gout   . MVP (mitral valve prolapse)     MILD  . Abnormal cardiovascular stress test     Past Surgical History  Procedure Date  . Carpal tunnel release   . Appendectomy   . Tonsillectomy   . Removal of parathyroid gland   . Breast mass excision   . Stress nuclear study     ABNORMAL    History  Smoking status  . Former Smoker -- 1.0 packs/day for 14 years  . Types: Cigarettes  . Quit date: 11/07/1968  Smokeless tobacco  . Never Used    History  Alcohol Use No    Family History  Problem Relation Age of Onset  . Stroke Mother   . Lung cancer Father     Review of Systems: The review of systems is positive for chronic back pain. S  All other systems were reviewed and are negative.  Physical Exam: BP 161/74  Pulse 78  Ht 5\' 6"  (1.676 m)  Wt 147 lb 6.4 oz (66.86 kg)  BMI 23.79 kg/m2 She is a pleasant elderly female in no acute distress. HEENT exam is unremarkable. She has no JVD. She has bilateral carotid bruits. Lungs are clear. Cardiac exam reveals a grade 1/6 systolic murmur at the left sternal border. Abdomen is soft and nontender. She has no edema. She has excellent pedal pulses. LABORATORY DATA: Dated 10/30/2011 complete chemistry panel was normal. Hemoglobin was 11.1. Total cholesterol 231, triglycerides 78, HDL 88, LDL 127. Non-HDL cholesterols 143. Apolipoprotein B was 77. Thyroid studies were normal. ECG today is normal.  Assessment / Plan:

## 2011-11-12 NOTE — Patient Instructions (Signed)
Continue your current therapy.  Watch your blood pressure at home.  I will see you again in 1 year.

## 2011-11-12 NOTE — Assessment & Plan Note (Signed)
Blood pressure today is significant elevated but she reports good blood pressure readings elsewhere. She is going to monitor her blood pressure more closely.

## 2012-02-12 DIAGNOSIS — M25519 Pain in unspecified shoulder: Secondary | ICD-10-CM | POA: Diagnosis not present

## 2012-02-19 DIAGNOSIS — S42309A Unspecified fracture of shaft of humerus, unspecified arm, initial encounter for closed fracture: Secondary | ICD-10-CM | POA: Diagnosis not present

## 2012-02-26 DIAGNOSIS — S42309A Unspecified fracture of shaft of humerus, unspecified arm, initial encounter for closed fracture: Secondary | ICD-10-CM | POA: Diagnosis not present

## 2012-03-10 DIAGNOSIS — S42309A Unspecified fracture of shaft of humerus, unspecified arm, initial encounter for closed fracture: Secondary | ICD-10-CM | POA: Diagnosis not present

## 2012-03-12 DIAGNOSIS — S42309A Unspecified fracture of shaft of humerus, unspecified arm, initial encounter for closed fracture: Secondary | ICD-10-CM | POA: Diagnosis not present

## 2012-03-14 DIAGNOSIS — S42309A Unspecified fracture of shaft of humerus, unspecified arm, initial encounter for closed fracture: Secondary | ICD-10-CM | POA: Diagnosis not present

## 2012-03-21 DIAGNOSIS — S42309A Unspecified fracture of shaft of humerus, unspecified arm, initial encounter for closed fracture: Secondary | ICD-10-CM | POA: Diagnosis not present

## 2012-03-28 DIAGNOSIS — S42309A Unspecified fracture of shaft of humerus, unspecified arm, initial encounter for closed fracture: Secondary | ICD-10-CM | POA: Diagnosis not present

## 2012-04-01 DIAGNOSIS — Z23 Encounter for immunization: Secondary | ICD-10-CM | POA: Diagnosis not present

## 2012-04-04 DIAGNOSIS — S42309A Unspecified fracture of shaft of humerus, unspecified arm, initial encounter for closed fracture: Secondary | ICD-10-CM | POA: Diagnosis not present

## 2012-04-18 DIAGNOSIS — M25519 Pain in unspecified shoulder: Secondary | ICD-10-CM | POA: Diagnosis not present

## 2012-04-24 DIAGNOSIS — S42309A Unspecified fracture of shaft of humerus, unspecified arm, initial encounter for closed fracture: Secondary | ICD-10-CM | POA: Diagnosis not present

## 2012-05-01 DIAGNOSIS — S42309A Unspecified fracture of shaft of humerus, unspecified arm, initial encounter for closed fracture: Secondary | ICD-10-CM | POA: Diagnosis not present

## 2012-05-06 DIAGNOSIS — S42309A Unspecified fracture of shaft of humerus, unspecified arm, initial encounter for closed fracture: Secondary | ICD-10-CM | POA: Diagnosis not present

## 2012-05-06 DIAGNOSIS — S5420XA Injury of radial nerve at forearm level, unspecified arm, initial encounter: Secondary | ICD-10-CM | POA: Diagnosis not present

## 2012-05-12 DIAGNOSIS — R569 Unspecified convulsions: Secondary | ICD-10-CM | POA: Diagnosis not present

## 2012-05-12 DIAGNOSIS — M1990 Unspecified osteoarthritis, unspecified site: Secondary | ICD-10-CM | POA: Diagnosis not present

## 2012-05-12 DIAGNOSIS — I10 Essential (primary) hypertension: Secondary | ICD-10-CM | POA: Diagnosis not present

## 2012-05-12 DIAGNOSIS — E042 Nontoxic multinodular goiter: Secondary | ICD-10-CM | POA: Diagnosis not present

## 2012-05-14 DIAGNOSIS — S42309A Unspecified fracture of shaft of humerus, unspecified arm, initial encounter for closed fracture: Secondary | ICD-10-CM | POA: Diagnosis not present

## 2012-05-24 DIAGNOSIS — S42309A Unspecified fracture of shaft of humerus, unspecified arm, initial encounter for closed fracture: Secondary | ICD-10-CM | POA: Diagnosis not present

## 2012-05-29 DIAGNOSIS — S42309A Unspecified fracture of shaft of humerus, unspecified arm, initial encounter for closed fracture: Secondary | ICD-10-CM | POA: Diagnosis not present

## 2012-05-30 DIAGNOSIS — S42309A Unspecified fracture of shaft of humerus, unspecified arm, initial encounter for closed fracture: Secondary | ICD-10-CM | POA: Diagnosis not present

## 2012-06-25 DIAGNOSIS — S42309A Unspecified fracture of shaft of humerus, unspecified arm, initial encounter for closed fracture: Secondary | ICD-10-CM | POA: Diagnosis not present

## 2012-07-04 DIAGNOSIS — S42309A Unspecified fracture of shaft of humerus, unspecified arm, initial encounter for closed fracture: Secondary | ICD-10-CM | POA: Diagnosis not present

## 2012-07-16 DIAGNOSIS — S42309A Unspecified fracture of shaft of humerus, unspecified arm, initial encounter for closed fracture: Secondary | ICD-10-CM | POA: Diagnosis not present

## 2012-09-10 DIAGNOSIS — S42309A Unspecified fracture of shaft of humerus, unspecified arm, initial encounter for closed fracture: Secondary | ICD-10-CM | POA: Diagnosis not present

## 2012-09-30 ENCOUNTER — Other Ambulatory Visit: Payer: Self-pay | Admitting: *Deleted

## 2012-09-30 DIAGNOSIS — S42309A Unspecified fracture of shaft of humerus, unspecified arm, initial encounter for closed fracture: Secondary | ICD-10-CM | POA: Diagnosis not present

## 2012-09-30 DIAGNOSIS — IMO0001 Reserved for inherently not codable concepts without codable children: Secondary | ICD-10-CM | POA: Diagnosis not present

## 2012-09-30 DIAGNOSIS — I10 Essential (primary) hypertension: Secondary | ICD-10-CM

## 2012-09-30 MED ORDER — NEBIVOLOL HCL 10 MG PO TABS: 10.0000 mg | ORAL_TABLET | Freq: Every day | ORAL | Status: DC

## 2012-09-30 NOTE — Telephone Encounter (Signed)
Fax Received. Refill Completed. [name] (R.M.A)   

## 2012-10-02 ENCOUNTER — Encounter: Payer: Self-pay | Admitting: Cardiology

## 2012-10-02 DIAGNOSIS — Z85828 Personal history of other malignant neoplasm of skin: Secondary | ICD-10-CM | POA: Diagnosis not present

## 2012-10-02 DIAGNOSIS — L819 Disorder of pigmentation, unspecified: Secondary | ICD-10-CM | POA: Diagnosis not present

## 2012-10-02 DIAGNOSIS — L821 Other seborrheic keratosis: Secondary | ICD-10-CM | POA: Diagnosis not present

## 2012-10-02 DIAGNOSIS — H61009 Unspecified perichondritis of external ear, unspecified ear: Secondary | ICD-10-CM | POA: Diagnosis not present

## 2012-10-02 DIAGNOSIS — D2370 Other benign neoplasm of skin of unspecified lower limb, including hip: Secondary | ICD-10-CM | POA: Diagnosis not present

## 2012-10-02 DIAGNOSIS — D239 Other benign neoplasm of skin, unspecified: Secondary | ICD-10-CM | POA: Diagnosis not present

## 2012-10-02 DIAGNOSIS — D1801 Hemangioma of skin and subcutaneous tissue: Secondary | ICD-10-CM | POA: Diagnosis not present

## 2012-10-02 DIAGNOSIS — L608 Other nail disorders: Secondary | ICD-10-CM | POA: Diagnosis not present

## 2012-10-31 ENCOUNTER — Encounter: Payer: Self-pay | Admitting: Cardiology

## 2012-10-31 DIAGNOSIS — R82998 Other abnormal findings in urine: Secondary | ICD-10-CM | POA: Diagnosis not present

## 2012-10-31 DIAGNOSIS — E042 Nontoxic multinodular goiter: Secondary | ICD-10-CM | POA: Diagnosis not present

## 2012-10-31 DIAGNOSIS — I10 Essential (primary) hypertension: Secondary | ICD-10-CM | POA: Diagnosis not present

## 2012-10-31 DIAGNOSIS — E785 Hyperlipidemia, unspecified: Secondary | ICD-10-CM | POA: Diagnosis not present

## 2012-10-31 DIAGNOSIS — M949 Disorder of cartilage, unspecified: Secondary | ICD-10-CM | POA: Diagnosis not present

## 2012-11-10 DIAGNOSIS — Z0000 Encounter for general adult medical examination without abnormal findings: Secondary | ICD-10-CM | POA: Diagnosis not present

## 2012-11-10 DIAGNOSIS — I10 Essential (primary) hypertension: Secondary | ICD-10-CM | POA: Diagnosis not present

## 2012-11-10 DIAGNOSIS — R569 Unspecified convulsions: Secondary | ICD-10-CM | POA: Diagnosis not present

## 2012-11-10 DIAGNOSIS — Z23 Encounter for immunization: Secondary | ICD-10-CM | POA: Diagnosis not present

## 2012-11-10 DIAGNOSIS — E042 Nontoxic multinodular goiter: Secondary | ICD-10-CM | POA: Diagnosis not present

## 2012-11-10 DIAGNOSIS — Z1331 Encounter for screening for depression: Secondary | ICD-10-CM | POA: Diagnosis not present

## 2012-11-10 DIAGNOSIS — G609 Hereditary and idiopathic neuropathy, unspecified: Secondary | ICD-10-CM | POA: Diagnosis not present

## 2012-11-10 DIAGNOSIS — E785 Hyperlipidemia, unspecified: Secondary | ICD-10-CM | POA: Diagnosis not present

## 2012-11-10 DIAGNOSIS — M899 Disorder of bone, unspecified: Secondary | ICD-10-CM | POA: Diagnosis not present

## 2012-11-10 DIAGNOSIS — M949 Disorder of cartilage, unspecified: Secondary | ICD-10-CM | POA: Diagnosis not present

## 2012-11-10 DIAGNOSIS — M1990 Unspecified osteoarthritis, unspecified site: Secondary | ICD-10-CM | POA: Diagnosis not present

## 2012-11-13 DIAGNOSIS — Z1212 Encounter for screening for malignant neoplasm of rectum: Secondary | ICD-10-CM | POA: Diagnosis not present

## 2012-11-14 ENCOUNTER — Other Ambulatory Visit: Payer: Self-pay | Admitting: Internal Medicine

## 2012-11-14 DIAGNOSIS — Z1231 Encounter for screening mammogram for malignant neoplasm of breast: Secondary | ICD-10-CM

## 2012-12-01 DIAGNOSIS — IMO0001 Reserved for inherently not codable concepts without codable children: Secondary | ICD-10-CM | POA: Diagnosis not present

## 2012-12-01 DIAGNOSIS — S42309A Unspecified fracture of shaft of humerus, unspecified arm, initial encounter for closed fracture: Secondary | ICD-10-CM | POA: Diagnosis not present

## 2012-12-23 DIAGNOSIS — H52209 Unspecified astigmatism, unspecified eye: Secondary | ICD-10-CM | POA: Diagnosis not present

## 2012-12-23 DIAGNOSIS — Z961 Presence of intraocular lens: Secondary | ICD-10-CM | POA: Diagnosis not present

## 2012-12-30 ENCOUNTER — Ambulatory Visit (INDEPENDENT_AMBULATORY_CARE_PROVIDER_SITE_OTHER): Payer: Medicare Other | Admitting: Cardiology

## 2012-12-30 ENCOUNTER — Encounter: Payer: Self-pay | Admitting: Cardiology

## 2012-12-30 VITALS — BP 158/60 | HR 72 | Ht 66.0 in | Wt 146.8 lb

## 2012-12-30 DIAGNOSIS — I779 Disorder of arteries and arterioles, unspecified: Secondary | ICD-10-CM | POA: Diagnosis not present

## 2012-12-30 DIAGNOSIS — E7800 Pure hypercholesterolemia, unspecified: Secondary | ICD-10-CM | POA: Diagnosis not present

## 2012-12-30 DIAGNOSIS — I10 Essential (primary) hypertension: Secondary | ICD-10-CM | POA: Diagnosis not present

## 2012-12-30 DIAGNOSIS — R9439 Abnormal result of other cardiovascular function study: Secondary | ICD-10-CM

## 2012-12-30 NOTE — Patient Instructions (Signed)
Continue your current therapy  Keep an eye on your blood pressure.  I will see you in one year.

## 2012-12-30 NOTE — Progress Notes (Addendum)
Candie Echevaria Date of Birth: March 15, 1936   History of Present Illness: Mrs. Kosinski is seen for yearly followup. She has had a difficult year. She tripped and fell in September and fractured her right humerus. This resulted in severe nerve damage. She has undergone extensive physical therapy and is now doing much better. She's had no TIA or CVA symptoms. She denies any chest pain or shortness of breath. She has a history of mildly abnormal cardiovascular stress test in April 2010. This showed a small reversible anterior wall defect. Ejection fraction was normal. She also has a history of some carotid arterial disease but this has been stable.  Current Outpatient Prescriptions on File Prior to Visit  Medication Sig Dispense Refill  . Calcium Carbonate-Vitamin D (CALTRATE 600+D PO) Take by mouth 2 (two) times daily.        . Ibuprofen (ADVIL PO) Take by mouth as needed.        . mometasone (ELOCON) 0.1 % cream Apply 1 application topically as needed.        . Multiple Vitamin (MULTIVITAMIN) tablet Take 1 tablet by mouth daily.        . nebivolol (BYSTOLIC) 10 MG tablet Take 1 tablet (10 mg total) by mouth daily.  90 tablet  0  . PHENobarbital (LUMINAL) 32.4 MG tablet Take 32.4 mg by mouth 2 (two) times daily.        . phenytoin (DILANTIN) 100 MG ER capsule Take 100 mg by mouth 2 (two) times daily.        . simvastatin (ZOCOR) 20 MG tablet Take 20 mg by mouth at bedtime.         No current facility-administered medications on file prior to visit.    Allergies  Allergen Reactions  . Naproxen Sodium     REACTION: rash  . Penicillins     REACTION: hives  . Pravastatin   . Sulfa Drugs Cross Reactors     Past Medical History  Diagnosis Date  . Numbness     LEFT FOOT AND ANKLE  . Hypertension   . Hypercholesterolemia   . Carotid arterial disease     MODERATE RIGHT  . Cerebral AV malformation   . DDD (degenerative disc disease)     SEVERE WITH SPINAL STENOSIS  . Osteopenia   .  Pseudo-gout   . MVP (mitral valve prolapse)     MILD  . Abnormal cardiovascular stress test     Past Surgical History  Procedure Laterality Date  . Carpal tunnel release    . Appendectomy    . Tonsillectomy    . Removal of parathyroid gland    . Breast mass excision    . Stress nuclear study      ABNORMAL    History  Smoking status  . Former Smoker -- 1.00 packs/day for 14 years  . Types: Cigarettes  . Quit date: 11/07/1968  Smokeless tobacco  . Never Used    History  Alcohol Use No    Family History  Problem Relation Age of Onset  . Stroke Mother   . Lung cancer Father     Review of Systems: As the history of present illness.  All other systems were reviewed and are negative.  Physical Exam: BP 158/60  Pulse 72  Ht 5\' 6"  (1.676 m)  Wt 146 lb 12.8 oz (66.588 kg)  BMI 23.71 kg/m2  SpO2 99% She is a pleasant elderly female in no acute distress. HEENT exam is unremarkable.  She has no JVD. She has bilateral carotid bruits. Lungs are clear. Cardiac exam reveals a grade 1/6 systolic murmur at the left sternal border. Abdomen is soft and nontender. She has no edema. She has excellent pedal pulses. LABORATORY DATA: Dated 10/31/2012 shows a normal complete chemistry panel and CBC. Total cholesterol 221, triglycerides 41, HDL 83, LDL 1:30. Thyroid function studies were normal. Urinalysis was normal. ECG today demonstrates normal sinus rhythm with left axis deviation. It is otherwise normal. Assessment / Plan: 1. Hypertension-blood pressure is elevated today. She has not been monitoring her blood pressure at home. She is going to start monitoring it and call if it is still significantly elevated. According to the patient her recent blood pressure at her primary care was acceptable.  2. Right humeral fracture with nerve damage. Recovering.  3. Hyperlipidemia.  4. Mildly abnormal stress test in 2010. Unclear if this is artifact. Patient has remained asymptomatic. We'll  continue risk factor modification.

## 2013-01-12 ENCOUNTER — Ambulatory Visit: Payer: Medicare Other

## 2013-01-14 ENCOUNTER — Other Ambulatory Visit: Payer: Self-pay | Admitting: *Deleted

## 2013-01-14 DIAGNOSIS — I10 Essential (primary) hypertension: Secondary | ICD-10-CM

## 2013-01-14 MED ORDER — NEBIVOLOL HCL 10 MG PO TABS: 10.0000 mg | ORAL_TABLET | Freq: Every day | ORAL | Status: DC

## 2013-01-27 DIAGNOSIS — M899 Disorder of bone, unspecified: Secondary | ICD-10-CM | POA: Diagnosis not present

## 2013-02-20 ENCOUNTER — Other Ambulatory Visit: Payer: Self-pay

## 2013-02-20 DIAGNOSIS — I10 Essential (primary) hypertension: Secondary | ICD-10-CM

## 2013-02-20 MED ORDER — NEBIVOLOL HCL 10 MG PO TABS: 10.0000 mg | ORAL_TABLET | Freq: Every day | ORAL | Status: DC

## 2013-03-05 DIAGNOSIS — Z23 Encounter for immunization: Secondary | ICD-10-CM | POA: Diagnosis not present

## 2013-04-29 ENCOUNTER — Other Ambulatory Visit: Payer: Self-pay

## 2013-04-29 DIAGNOSIS — Z1231 Encounter for screening mammogram for malignant neoplasm of breast: Secondary | ICD-10-CM

## 2013-05-14 DIAGNOSIS — G609 Hereditary and idiopathic neuropathy, unspecified: Secondary | ICD-10-CM | POA: Diagnosis not present

## 2013-05-14 DIAGNOSIS — R569 Unspecified convulsions: Secondary | ICD-10-CM | POA: Diagnosis not present

## 2013-05-14 DIAGNOSIS — I10 Essential (primary) hypertension: Secondary | ICD-10-CM | POA: Diagnosis not present

## 2013-05-14 DIAGNOSIS — Q279 Congenital malformation of peripheral vascular system, unspecified: Secondary | ICD-10-CM | POA: Diagnosis not present

## 2013-05-14 DIAGNOSIS — IMO0002 Reserved for concepts with insufficient information to code with codable children: Secondary | ICD-10-CM | POA: Diagnosis not present

## 2013-05-14 DIAGNOSIS — E785 Hyperlipidemia, unspecified: Secondary | ICD-10-CM | POA: Diagnosis not present

## 2013-05-14 DIAGNOSIS — E042 Nontoxic multinodular goiter: Secondary | ICD-10-CM | POA: Diagnosis not present

## 2013-06-02 ENCOUNTER — Ambulatory Visit: Payer: Medicare Other

## 2013-06-08 DIAGNOSIS — J111 Influenza due to unidentified influenza virus with other respiratory manifestations: Secondary | ICD-10-CM | POA: Diagnosis not present

## 2013-06-08 DIAGNOSIS — R05 Cough: Secondary | ICD-10-CM | POA: Diagnosis not present

## 2013-06-08 DIAGNOSIS — I10 Essential (primary) hypertension: Secondary | ICD-10-CM | POA: Diagnosis not present

## 2013-06-08 DIAGNOSIS — R197 Diarrhea, unspecified: Secondary | ICD-10-CM | POA: Diagnosis not present

## 2013-06-08 DIAGNOSIS — IMO0002 Reserved for concepts with insufficient information to code with codable children: Secondary | ICD-10-CM | POA: Diagnosis not present

## 2013-06-23 ENCOUNTER — Ambulatory Visit: Payer: Medicare Other

## 2013-07-14 ENCOUNTER — Ambulatory Visit
Admission: RE | Admit: 2013-07-14 | Discharge: 2013-07-14 | Disposition: A | Discharge status: Home / Self Care | Payer: Medicare Other | Source: Ambulatory Visit

## 2013-07-14 DIAGNOSIS — Z1231 Encounter for screening mammogram for malignant neoplasm of breast: Secondary | ICD-10-CM | POA: Diagnosis not present

## 2013-09-09 DIAGNOSIS — Z01419 Encounter for gynecological examination (general) (routine) without abnormal findings: Secondary | ICD-10-CM | POA: Diagnosis not present

## 2013-09-09 DIAGNOSIS — Z124 Encounter for screening for malignant neoplasm of cervix: Secondary | ICD-10-CM | POA: Diagnosis not present

## 2013-09-23 DIAGNOSIS — D239 Other benign neoplasm of skin, unspecified: Secondary | ICD-10-CM | POA: Diagnosis not present

## 2013-09-23 DIAGNOSIS — H61009 Unspecified perichondritis of external ear, unspecified ear: Secondary | ICD-10-CM | POA: Diagnosis not present

## 2013-09-23 DIAGNOSIS — D1801 Hemangioma of skin and subcutaneous tissue: Secondary | ICD-10-CM | POA: Diagnosis not present

## 2013-09-23 DIAGNOSIS — L821 Other seborrheic keratosis: Secondary | ICD-10-CM | POA: Diagnosis not present

## 2013-09-23 DIAGNOSIS — Z85828 Personal history of other malignant neoplasm of skin: Secondary | ICD-10-CM | POA: Diagnosis not present

## 2013-11-11 DIAGNOSIS — M949 Disorder of cartilage, unspecified: Secondary | ICD-10-CM | POA: Diagnosis not present

## 2013-11-11 DIAGNOSIS — E042 Nontoxic multinodular goiter: Secondary | ICD-10-CM | POA: Diagnosis not present

## 2013-11-11 DIAGNOSIS — R82998 Other abnormal findings in urine: Secondary | ICD-10-CM | POA: Diagnosis not present

## 2013-11-11 DIAGNOSIS — M899 Disorder of bone, unspecified: Secondary | ICD-10-CM | POA: Diagnosis not present

## 2013-11-11 DIAGNOSIS — R809 Proteinuria, unspecified: Secondary | ICD-10-CM | POA: Diagnosis not present

## 2013-11-11 DIAGNOSIS — I10 Essential (primary) hypertension: Secondary | ICD-10-CM | POA: Diagnosis not present

## 2013-11-11 DIAGNOSIS — E785 Hyperlipidemia, unspecified: Secondary | ICD-10-CM | POA: Diagnosis not present

## 2013-11-20 DIAGNOSIS — R569 Unspecified convulsions: Secondary | ICD-10-CM | POA: Diagnosis not present

## 2013-11-20 DIAGNOSIS — M949 Disorder of cartilage, unspecified: Secondary | ICD-10-CM | POA: Diagnosis not present

## 2013-11-20 DIAGNOSIS — Z79899 Other long term (current) drug therapy: Secondary | ICD-10-CM | POA: Diagnosis not present

## 2013-11-20 DIAGNOSIS — E785 Hyperlipidemia, unspecified: Secondary | ICD-10-CM | POA: Diagnosis not present

## 2013-11-20 DIAGNOSIS — G609 Hereditary and idiopathic neuropathy, unspecified: Secondary | ICD-10-CM | POA: Diagnosis not present

## 2013-11-20 DIAGNOSIS — M899 Disorder of bone, unspecified: Secondary | ICD-10-CM | POA: Diagnosis not present

## 2013-11-20 DIAGNOSIS — I10 Essential (primary) hypertension: Secondary | ICD-10-CM | POA: Diagnosis not present

## 2013-11-20 DIAGNOSIS — D649 Anemia, unspecified: Secondary | ICD-10-CM | POA: Diagnosis not present

## 2013-11-20 DIAGNOSIS — E042 Nontoxic multinodular goiter: Secondary | ICD-10-CM | POA: Diagnosis not present

## 2013-11-20 DIAGNOSIS — Z0000 Encounter for general adult medical examination without abnormal findings: Secondary | ICD-10-CM | POA: Diagnosis not present

## 2013-11-23 ENCOUNTER — Ambulatory Visit: Payer: Self-pay | Admitting: Neurology

## 2013-11-24 ENCOUNTER — Ambulatory Visit (INDEPENDENT_AMBULATORY_CARE_PROVIDER_SITE_OTHER): Payer: Medicare Other | Admitting: Neurology

## 2013-11-24 ENCOUNTER — Encounter (INDEPENDENT_AMBULATORY_CARE_PROVIDER_SITE_OTHER): Payer: Self-pay

## 2013-11-24 ENCOUNTER — Encounter: Payer: Self-pay | Admitting: Neurology

## 2013-11-24 VITALS — BP 156/80 | HR 62 | Ht 65.0 in | Wt 143.0 lb

## 2013-11-24 DIAGNOSIS — R269 Unspecified abnormalities of gait and mobility: Secondary | ICD-10-CM | POA: Diagnosis not present

## 2013-11-24 DIAGNOSIS — M545 Low back pain, unspecified: Secondary | ICD-10-CM

## 2013-11-24 NOTE — Progress Notes (Signed)
PATIENT: [name] DOB: 1935-07-03  HISTORICAL  [name] is a 78 years old right-handed female, referred by her primary care physician Dr. Hulan Fray for evaluation of peripheral neuropathy.  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  Last evaluation was by [facility] Dr. Jalene Mullet, most repeat MRI brain was more than 20 years ago.  She also PMHx of HTN, HLD, she also has a history of moderate stenosis at right internal carotid artery by ultrasound Doppler study, repeat evaluation showed no significant change over the years, last evaluation was in 2013.  She complains of bilateral feet paresthesia since 2009, she noticed sudden onset of low back pain, shooting pain to her right leg, that she could not bear weight, was found to have lumbar degenerative disc disease, lumbar stenosis, was given epidural injection, last was 2010.   In 2010, she has EMG/NCS at [facility] for low back pain and bilateral feet paresthesia,  was told that she had left foot tarsal tunnel syndrome, then she noticed right plantar feet numbness, tingling then.  Her bilateral feet paresthesia has slow progressed over the years, she noticed more numbness, still stay at plantar, difficulty abducting her toes.   She also noticed mild gait difficulty, no significant low back pain, no shooting pain to her legs, no bowel and bladder incontinence.    She suffered right radial neuropathy when she suffered right humeral fracture in 2013, after tripped and fell, no surgery required, mild right dorsum hand numbness, no significant right hand weakness now.  She denies neck pain,   We have reviewed MRI lumbar in 2009, Multifactorial significant spinal stenosis noted from the L2-3 through L5 S level. It is difficult to state which level  may be  responsible for the patient's symptoms. The disc protrusion is slightly more prominent on the right at the L2-3 and L5 S1 level and on the left at the L3-4 and L4-5 level.   REVIEW OF SYSTEMS: Full 14 system review of systems performed and notable only for joints pain, allergies, runny nose, numbness, weakness, restless.  ALLERGIES: Allergies  Allergen Reactions  . Naproxen Sodium     REACTION: rash  . Penicillins     REACTION: hives  . Pravastatin   . Sulfa Drugs Cross Reactors     HOME MEDICATIONS: Current Outpatient Prescriptions on File Prior to Visit  Medication Sig Dispense Refill  . Calcium Carbonate-Vitamin D (CALTRATE 600+D PO) Take by mouth daily.       . Ibuprofen (ADVIL PO) Take by mouth as needed.        . mometasone (ELOCON) 0.1 % cream Apply 1 application topically as needed.        . Multiple Vitamin (MULTIVITAMIN) tablet Take 1 tablet by mouth daily.        . nebivolol (BYSTOLIC) 10 MG tablet Take 1 tablet (10 mg total) by mouth daily.  90 tablet  3  . PHENobarbital (LUMINAL) 32.4 MG tablet Take 32.4 mg by mouth daily.       . phenytoin (DILANTIN) 100 MG ER capsule Take 100 mg by mouth daily.       Vladimir Faster Glycol-Propyl Glycol (SYSTANE OP) Apply to eye.      . pyridOXINE (VITAMIN B-6) 50 MG tablet Take 50 mg by mouth daily.      Marland Kitchen  simvastatin (ZOCOR) 20 MG tablet Take 20 mg by mouth at bedtime.           PAST MEDICAL HISTORY: Past Medical History  Diagnosis Date  . Numbness     LEFT FOOT AND ANKLE  . Hypertension   . Hypercholesterolemia   . Carotid arterial disease     MODERATE RIGHT  . Cerebral AV malformation   . DDD (degenerative disc disease)     SEVERE WITH SPINAL STENOSIS  . Osteopenia   . Pseudo-gout   . MVP (mitral valve prolapse)     MILD  . Abnormal cardiovascular stress test     PAST SURGICAL HISTORY: Past Surgical History  Procedure Laterality Date  . Carpal tunnel release    . Appendectomy    . Tonsillectomy    .  Removal of parathyroid gland    . Breast mass excision    . Stress nuclear study      ABNORMAL    FAMILY HISTORY: Family History  Problem Relation Age of Onset  . Stroke Mother   . Lung cancer Father    SOCIAL HISTORY:  History   Social History  . Marital Status: Married    Spouse Name: N/A    Number of Children: 3  . Years of Education: N/A   Occupational History  . Not on file.   Social History Main Topics  . Smoking status: Former Smoker -- 1.00 packs/day for 14 years    Types: Cigarettes    Quit date: 11/07/1968  . Smokeless tobacco: Never Used  . Alcohol Use: No  . Drug Use: No  . Sexual Activity: Not on file   Other Topics Concern  . Retired Education officer, museum   Social History Narrative  . No narrative on file     PHYSICAL EXAM   Filed Vitals:   11/24/13 1315  BP: 156/80  Pulse: 62  Height: 5\' 5"  (1.651 m)  Weight: 143 lb (64.864 kg)    Not recorded    Body mass index is 23.8 kg/(m^2).   Generalized: In no acute distress  Neck: Supple, no carotid bruits   Cardiac: Regular rate rhythm  Pulmonary: Clear to auscultation bilaterally  Musculoskeletal: She has scoliosis, deformed left hand joints,  Neurological examination  Mentation: Alert oriented to time, place, history taking, and causual conversation  Cranial nerve II-XII: Pupils were equal round reactive to light. Extraocular movements were full.  Visual field were full on confrontational test. Bilateral fundi were sharp, static right ptosis.  Facial sensation and strength were normal. Hearing was intact to finger rubbing bilaterally. Uvula tongue midline.  Head turning and shoulder shrug and were normal and symmetric.Tongue protrusion into cheek strength was normal.  Motor: Ltd. range of motion of right shoulder, mild right shoulder abduction, external rotation, elbow flexion, moderate right shoulder extension weakness, also has mild right supination, wrist flexion, finger flexion weakness,  mild bilateral toe extension weakness, right worse than left  Sensory: Iength dependent decreased fine touch, pinprick ankle level, absent toe vibratory sensation  Coordination: Normal finger to nose, heel-to-shin bilaterally there was no truncal ataxia  Gait: Mildly unsteady, cautious gait, scoliosis, upper body lean  towards the left side, could not stand up on tiptoe, or heels,  Romberg signs: Negative  Deep tendon reflexes: Brachioradialis 2/2, biceps 2/2, triceps 2/2, patellar 2/2, Achilles absent, plantar responses were flexor bilaterally.   DIAGNOSTIC DATA (LABS, IMAGING, TESTING) - I reviewed patient records, labs, notes, testing and imaging myself where available.  ASSESSMENT  AND PLAN  [name] is a 78 y.o. female with known history of severe multilevel lumbar stenosis, now presenting with worsening bilateral feet paresthesia, six-month history of worsening gait difficulty, also suffered right humeral fracture, with evidence of  right radial neuropathy, on examination, she has scoliosis, mild distal leg weakness, length dependent sensory changes,  Consistent with her history of lumbar stenosis, likely superimposed peripheral neuropathy, MRI lumbar EMG nerve conduction study Laboratory evaluation from her primary care Return to clinic in one months    Marcial Pacas, M.D. Ph.D.  [facility] [address] [place], [place] [address] [phone]

## 2013-11-26 DIAGNOSIS — Z1212 Encounter for screening for malignant neoplasm of rectum: Secondary | ICD-10-CM | POA: Diagnosis not present

## 2013-12-25 ENCOUNTER — Ambulatory Visit: Payer: Medicare Other | Admitting: Neurology

## 2013-12-28 DIAGNOSIS — H02409 Unspecified ptosis of unspecified eyelid: Secondary | ICD-10-CM | POA: Diagnosis not present

## 2013-12-28 DIAGNOSIS — H52209 Unspecified astigmatism, unspecified eye: Secondary | ICD-10-CM | POA: Diagnosis not present

## 2013-12-28 DIAGNOSIS — Z961 Presence of intraocular lens: Secondary | ICD-10-CM | POA: Diagnosis not present

## 2013-12-29 ENCOUNTER — Ambulatory Visit
Admission: RE | Admit: 2013-12-29 | Discharge: 2013-12-29 | Disposition: A | Discharge status: Home / Self Care | Payer: Medicare Other | Source: Ambulatory Visit | Attending: Neurology | Admitting: Neurology

## 2013-12-29 DIAGNOSIS — M545 Low back pain, unspecified: Secondary | ICD-10-CM | POA: Diagnosis not present

## 2013-12-29 DIAGNOSIS — R269 Unspecified abnormalities of gait and mobility: Secondary | ICD-10-CM | POA: Diagnosis not present

## 2014-01-04 ENCOUNTER — Telehealth: Payer: Self-pay | Admitting: Neurology

## 2014-01-04 NOTE — Telephone Encounter (Signed)
I will review her MRI lumbar findings in her July 30 appt.

## 2014-01-05 ENCOUNTER — Ambulatory Visit: Payer: Medicare Other | Admitting: Cardiology

## 2014-01-07 ENCOUNTER — Ambulatory Visit (INDEPENDENT_AMBULATORY_CARE_PROVIDER_SITE_OTHER): Payer: Medicare Other | Admitting: Neurology

## 2014-01-07 ENCOUNTER — Encounter (INDEPENDENT_AMBULATORY_CARE_PROVIDER_SITE_OTHER): Payer: Self-pay

## 2014-01-07 DIAGNOSIS — Z0289 Encounter for other administrative examinations: Secondary | ICD-10-CM

## 2014-01-07 DIAGNOSIS — M545 Low back pain, unspecified: Secondary | ICD-10-CM

## 2014-01-07 DIAGNOSIS — R269 Unspecified abnormalities of gait and mobility: Secondary | ICD-10-CM

## 2014-01-07 DIAGNOSIS — M48061 Spinal stenosis, lumbar region without neurogenic claudication: Secondary | ICD-10-CM

## 2014-01-07 DIAGNOSIS — R209 Unspecified disturbances of skin sensation: Secondary | ICD-10-CM | POA: Diagnosis not present

## 2014-01-07 NOTE — Progress Notes (Signed)
PATIENT: MADDISON KILNER DOB: [birth year]  HISTORICAL  REVE CROCKET is a 78 years old right-handed female, referred by her primary care physician Dr. Hulan Fray for evaluation of peripheral neuropathy.  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  Last evaluation was by [facility] Dr. Jalene Mullet, most repeat MRI brain was more than 20 years ago.  She also PMHx of HTN, HLD, she also has a history of moderate stenosis at right internal carotid artery by ultrasound Doppler study, repeat evaluation showed no significant change over the years, last evaluation was in 2013.  She complains of bilateral feet paresthesia since 2009, she noticed sudden onset of low back pain, shooting pain to her right leg, that she could not bear weight, was found to have lumbar degenerative disc disease, lumbar stenosis, was given epidural injection, last was 2010.   In 2010, she has EMG/NCS at [facility] for low back pain and bilateral feet paresthesia,  was told that she had left foot tarsal tunnel syndrome, then she noticed right plantar feet numbness, tingling then.  Her bilateral feet paresthesia has slow progressed over the years, she noticed more numbness, still stay at plantar, difficulty abducting her toes.   She also noticed mild gait difficulty, no significant low back pain, no shooting pain to her legs, no bowel and bladder incontinence.    She suffered right radial neuropathy when she suffered right humeral fracture in 2013, after tripped and fell, no surgery required, mild right dorsum hand numbness, no significant right hand weakness now.  She denies neck pain,   We have reviewed MRI lumbar in 2009, Multifactorial significant spinal stenosis noted from the L2-3 through L5 S level. It is difficult to state which level  may be  responsible for the patient's symptoms. The disc protrusion is slightly more prominent on the right at the L2-3 and L5 S1 level and on the left at the L3-4 and L4-5 level.  UPDATE July 30th 2015:  We have reviewed MRI lumbarAbnormal MRI lumbar spine (without) demonstrating:  1. Severe spinal stenosis from L2-3 to L5-S1.  2. Moderate spinal stenosis at L1-2.  3. Multi-level foraminal stenosis from L1-2 to L5-S1.  4. Right renal cyst 1.9cm.  5. Compared to prior MRI on 10/25/07, there has been mild progression of degenerative spine disease. Also, right renal cyst has enlarged from 1.0 to 1.9 cm in diameter.  Electrodiagnostic study January 07 2014: has demonstrated list dependent mild axonal peripheral neuropathy, there was also evidence of chronic neuropathic changes humanity right L5-S1 myotomes, consistent with his right chronic lumbar radiculopathy   She has mild unsteady gait, the most bothersome symptoms is bilateral feet numbness, she denies bowel bladder incontinence,  After discussion, I suggested continued conservative treatment, defer neurosurgical evaluation for her multilevel lumbar spinal stenosis  Laboratory evaluation revealed a normal CMP, anemia hemoglobin 10. 2, this is under supervision of her primary care physician Dr.Aroson,  LDL 113 normal TSH, vitamin D 40.7   REVIEW OF SYSTEMS: Full 14 system review of systems performed and notable only for joints pain, allergies, runny nose, numbness, weakness, restless.  ALLERGIES: Allergies  Allergen Reactions  . Naproxen Sodium     REACTION: rash  . Penicillins     REACTION: hives  . Pravastatin   . Sulfa Drugs Cross Reactors  HOME MEDICATIONS: Current Outpatient Prescriptions on File Prior to Visit  Medication Sig Dispense Refill  . Calcium Carbonate-Vitamin D (CALTRATE 600+D PO) Take by mouth daily.       . Ibuprofen (ADVIL PO) Take by mouth as needed.        . mometasone (ELOCON) 0.1 % cream Apply 1  application topically as needed.        . Multiple Vitamin (MULTIVITAMIN) tablet Take 1 tablet by mouth daily.        . nebivolol (BYSTOLIC) 10 MG tablet Take 1 tablet (10 mg total) by mouth daily.  90 tablet  3  . PHENobarbital (LUMINAL) 32.4 MG tablet Take 32.4 mg by mouth daily.       . phenytoin (DILANTIN) 100 MG ER capsule Take 100 mg by mouth daily.       Vladimir Faster Glycol-Propyl Glycol (SYSTANE OP) Apply to eye.      . pyridOXINE (VITAMIN B-6) 50 MG tablet Take 50 mg by mouth daily.      . simvastatin (ZOCOR) 20 MG tablet Take 20 mg by mouth at bedtime.           PAST MEDICAL HISTORY: Past Medical History  Diagnosis Date  . Numbness     LEFT FOOT AND ANKLE  . Hypertension   . Hypercholesterolemia   . Carotid arterial disease     MODERATE RIGHT  . Cerebral AV malformation   . DDD (degenerative disc disease)     SEVERE WITH SPINAL STENOSIS  . Osteopenia   . Pseudo-gout   . MVP (mitral valve prolapse)     MILD  . Abnormal cardiovascular stress test     PAST SURGICAL HISTORY: Past Surgical History  Procedure Laterality Date  . Carpal tunnel release    . Appendectomy    . Tonsillectomy    . Removal of parathyroid gland    . Breast mass excision    . Stress nuclear study      ABNORMAL    FAMILY HISTORY: Family History  Problem Relation Age of Onset  . Stroke Mother   . Lung cancer Father    SOCIAL HISTORY:  History   Social History  . Marital Status: Married    Spouse Name: N/A    Number of Children: 3  . Years of Education: N/A   Occupational History  . Not on file.   Social History Main Topics  . Smoking status: Former Smoker -- 1.00 packs/day for 14 years    Types: Cigarettes    Quit date: 11/07/1968  . Smokeless tobacco: Never Used  . Alcohol Use: No  . Drug Use: No  . Sexual Activity: Not on file   Other Topics Concern  . Retired Education officer, museum   Social History Narrative  . No narrative on file     PHYSICAL EXAM   There were no  vitals filed for this visit.  Not recorded    There is no weight on file to calculate BMI.   Generalized: In no acute distress  Neck: Supple, no carotid bruits   Cardiac: Regular rate rhythm  Pulmonary: Clear to auscultation bilaterally  Musculoskeletal: She has scoliosis, deformed left hand joints,  Neurological examination  Mentation: Alert oriented to time, place, history taking, and causual conversation  Cranial nerve II-XII: Pupils were equal round reactive to light. Extraocular movements were full.  Visual field were full on confrontational test. Bilateral fundi were sharp, static right ptosis.  Facial sensation and strength were normal. Hearing  was intact to finger rubbing bilaterally. Uvula tongue midline.  Head turning and shoulder shrug and were normal and symmetric.Tongue protrusion into cheek strength was normal.  Motor: Ltd. range of motion of right shoulder, mild right shoulder abduction, external rotation, elbow flexion, moderate right shoulder extension weakness, also has mild right supination, wrist flexion, finger flexion weakness, mild bilateral toe extension weakness, right worse than left  Sensory: Iength dependent decreased fine touch, pinprick ankle level, absent toe vibratory sensation  Coordination: Normal finger to nose, heel-to-shin bilaterally there was no truncal ataxia  Gait: Mildly unsteady, cautious gait, scoliosis, upper body lean  towards the left side, could not stand up on tiptoe, or heels,  Romberg signs: Negative  Deep tendon reflexes: Brachioradialis 2/2, biceps 2/2, triceps 2/2, patellar 2/2, Achilles absent, plantar responses were flexor bilaterally.   DIAGNOSTIC DATA (LABS, IMAGING, TESTING) - I reviewed patient records, labs, notes, testing and imaging myself where available.  ASSESSMENT AND PLAN  DARRELLE BARRELL is a 78 y.o. female with known history of severe multilevel lumbar stenosis, now presenting with worsening bilateral feet  paresthesia, six-month history of worsening gait difficulty, also suffered right humeral fracture, with evidence of  right radial neuropathy, on examination, she has scoliosis, mild distal leg weakness, length dependent sensory changes,  Consistent with  lumbar stenosis with superimposed peripheral neuropathy, with EMG nerve conduction study, and MRI findings detailed above  We decided to continue observe her symptoms, defer neurosurgical referral at this point,  Return to clinic in 3 months.    Marcial Pacas, M.D. Ph.D.  [facility] [address], [place], [place] [address] [phone]

## 2014-01-07 NOTE — Procedures (Signed)
   NCS (NERVE CONDUCTION STUDY) WITH EMG (ELECTROMYOGRAPHY) REPORT   STUDY DATE: January 07 2014 PATIENT NAME: [name] DOB: [birth year] MRN: [id number]    TECHNOLOGIST: Vear Clock ELECTROMYOGRAPHER: Marcial Pacas M.D.  CLINICAL INFORMATION: 78 year old female, with chronic low back pain, progressive worsening bilateral feet paresthesia, mild unsteady gait  On examination, she has mild bilateral toe extension weakness, there was no significant ankle plantarflexion, ankle dorsiflexion weakness. Hyporeflexia, absent ankle reflexes  FINDINGS: NERVE CONDUCTION STUDY: Bilateral peroneal sensory responses were absent. Bilateral peroneal, tibial responses showed moderately decreased C. map amplitude, preserved distal latency, conduction velocity. Bilateral tibial H. reflexes with absent.  Right median, ulnar, median sensory responses were normal  Right median, and ulnar motor responses were normal  NEEDLE ELECTROMYOGRAPHY: Selected needle examination was performed at right lower extremity muscles, right lumbosacral paraspinal muscles  Right tibialis posterior, tibialis anterior: Normally insertion activity, no spontaneous activity, enlarged mildly complex motor unit potential, with mildly decreased recruitment patterns.  Right vastus lateralis, biceps femoris short head, biceps femoris long head: Normally insertion activity, no spontaneous activity, enlarged complex motor unit potential, with mildly decreased recruitment patterns.  Right gluteus medius, Normally insertion activity, no spontaneous activity, enlarged mildly complex motor unit potential, with mildly decreased recruitment patterns.  There was no spontaneous activity at right lumbosacral paraspinal muscles, right L4-5 S1    IMPRESSION:   This is an abnormal study. There is electrodiagnostic evidence of mild length dependent axonal peripheral neuropathy, there was also evidence of chronic right lumbar radiculopathy,  involving right L4, L5, S1 consistent with chronic right lumbosacral radiculopathy.   INTERPRETING PHYSICIAN:   Marcial Pacas M.D. Ph.D. [facility] [address], [place], [place] [address] [phone]

## 2014-01-11 DIAGNOSIS — R82998 Other abnormal findings in urine: Secondary | ICD-10-CM | POA: Diagnosis not present

## 2014-01-11 DIAGNOSIS — R300 Dysuria: Secondary | ICD-10-CM | POA: Diagnosis not present

## 2014-02-03 ENCOUNTER — Ambulatory Visit: Payer: Medicare Other | Admitting: Neurology

## 2014-02-25 DIAGNOSIS — Z23 Encounter for immunization: Secondary | ICD-10-CM | POA: Diagnosis not present

## 2014-03-09 ENCOUNTER — Ambulatory Visit (INDEPENDENT_AMBULATORY_CARE_PROVIDER_SITE_OTHER): Payer: Medicare Other | Admitting: Cardiology

## 2014-03-09 ENCOUNTER — Encounter: Payer: Self-pay | Admitting: Cardiology

## 2014-03-09 VITALS — BP 156/62 | HR 80 | Ht 66.0 in | Wt 143.1 lb

## 2014-03-09 DIAGNOSIS — I10 Essential (primary) hypertension: Secondary | ICD-10-CM

## 2014-03-09 DIAGNOSIS — R9439 Abnormal result of other cardiovascular function study: Secondary | ICD-10-CM

## 2014-03-09 DIAGNOSIS — I779 Disorder of arteries and arterioles, unspecified: Secondary | ICD-10-CM | POA: Diagnosis not present

## 2014-03-09 DIAGNOSIS — I739 Peripheral vascular disease, unspecified: Secondary | ICD-10-CM

## 2014-03-09 MED ORDER — CARVEDILOL 12.5 MG PO TABS: 12.5000 mg | ORAL_TABLET | Freq: Two times a day (BID) | ORAL | Status: DC

## 2014-03-09 NOTE — Patient Instructions (Signed)
We will switch Bystolic to Carvedilol 00.8 mg twice a day

## 2014-03-10 NOTE — Progress Notes (Signed)
[name] Date of Birth: 1936/05/26   History of Present Illness: [name] is seen for yearly followup. She has a history of mildly abnormal myoview study in 2010. She has been treated conservatively and has done well.  She denies any chest pain or shortness of breath.  She also has a history of some carotid arterial disease but this has been stable.She states her Bystolic cost has increased to over $100/month.   Current Outpatient Prescriptions on File Prior to Visit  Medication Sig Dispense Refill  . Calcium Carbonate-Vitamin D (CALTRATE 600+D PO) Take by mouth daily.       . cholecalciferol (VITAMIN D) 1000 UNITS tablet Take 1,000 Units by mouth once a week.      . Ibuprofen (ADVIL PO) Take by mouth as needed.        . mometasone (ELOCON) 0.1 % cream Apply 1 application topically as needed.        . Multiple Vitamin (MULTIVITAMIN) tablet Take 1 tablet by mouth daily.        Marland Kitchen PHENobarbital (LUMINAL) 32.4 MG tablet Take 32.4 mg by mouth daily.       . phenytoin (DILANTIN) 100 MG ER capsule Take 100 mg by mouth daily.       Marland Kitchen pyridOXINE (VITAMIN B-6) 50 MG tablet Take 50 mg by mouth daily.      . simvastatin (ZOCOR) 20 MG tablet Take 20 mg by mouth at bedtime.         No current facility-administered medications on file prior to visit.    Allergies  Allergen Reactions  . Naproxen Sodium     REACTION: rash  . Penicillins     REACTION: hives  . Pravastatin   . Sulfa Drugs Cross Reactors     Past Medical History  Diagnosis Date  . Numbness     LEFT FOOT AND ANKLE  . Hypertension   . Hypercholesterolemia   . Carotid arterial disease     MODERATE RIGHT  . Cerebral AV malformation   . DDD (degenerative disc disease)     SEVERE WITH SPINAL STENOSIS  . Osteopenia   . Pseudo-gout   . MVP (mitral valve prolapse)     MILD  . Abnormal cardiovascular stress test     Past Surgical History  Procedure Laterality Date  . Carpal tunnel release    . Appendectomy    .  Tonsillectomy    . Removal of parathyroid gland    . Breast mass excision    . Stress nuclear study      ABNORMAL    History  Smoking status  . Former Smoker -- 1.00 packs/day for 14 years  . Types: Cigarettes  . Quit date: 11/07/1968  Smokeless tobacco  . Never Used    History  Alcohol Use No    Family History  Problem Relation Age of Onset  . Stroke Mother   . Lung cancer Father     Review of Systems: As the history of present illness.  All other systems were reviewed and are negative.  Physical Exam: BP 156/62  Pulse 80  Ht 5\' 6"  (1.676 m)  Wt 143 lb 1.6 oz (64.91 kg)  BMI 23.11 kg/m2 She is a pleasant elderly female in no acute distress. HEENT exam is unremarkable. She has no JVD. She has bilateral carotid bruits. Lungs are clear. Cardiac exam reveals a grade 1/6 systolic murmur at the left sternal border. Abdomen is soft and nontender. She has no  edema. She has excellent pedal pulses. LABORATORY DATA: Dated 11/11/13 shows a normal complete chemistry panel. Hgb 10.2.  Total cholesterol 203, triglycerides 50, HDL 80, LDL 113. Thyroid function studies were normal.  ECG today demonstrates normal sinus rhythm with LAE.  It is otherwise normal.  Assessment / Plan: 1. Hypertension-blood pressure is elevated today. She can no longer afford Bystolic. We will switch her to Carvedilol and she is going to monitor her BP more closely at home.   2.  Hyperlipidemia.  3. Mildly abnormal stress test in 2010. Unclear if this is artifact. Patient has remained asymptomatic. We'll continue risk factor modification.

## 2014-06-07 ENCOUNTER — Ambulatory Visit: Payer: Medicare Other | Admitting: Neurology

## 2014-06-09 ENCOUNTER — Ambulatory Visit: Payer: Medicare Other | Admitting: Neurology

## 2014-06-21 DIAGNOSIS — J209 Acute bronchitis, unspecified: Secondary | ICD-10-CM | POA: Diagnosis not present

## 2014-06-21 DIAGNOSIS — Z6822 Body mass index (BMI) 22.0-22.9, adult: Secondary | ICD-10-CM | POA: Diagnosis not present

## 2014-06-21 DIAGNOSIS — R05 Cough: Secondary | ICD-10-CM | POA: Diagnosis not present

## 2014-06-24 ENCOUNTER — Ambulatory Visit: Payer: Medicare Other | Admitting: Neurology

## 2014-07-14 ENCOUNTER — Other Ambulatory Visit: Payer: Self-pay

## 2014-07-14 DIAGNOSIS — Z1231 Encounter for screening mammogram for malignant neoplasm of breast: Secondary | ICD-10-CM

## 2014-07-28 ENCOUNTER — Ambulatory Visit (INDEPENDENT_AMBULATORY_CARE_PROVIDER_SITE_OTHER): Payer: Medicare Other | Admitting: Neurology

## 2014-07-28 ENCOUNTER — Encounter: Payer: Self-pay | Admitting: Neurology

## 2014-07-28 VITALS — BP 183/76 | HR 92 | Ht 66.0 in | Wt 143.0 lb

## 2014-07-28 DIAGNOSIS — G40209 Localization-related (focal) (partial) symptomatic epilepsy and epileptic syndromes with complex partial seizures, not intractable, without status epilepticus: Secondary | ICD-10-CM | POA: Diagnosis not present

## 2014-07-28 DIAGNOSIS — M4806 Spinal stenosis, lumbar region: Secondary | ICD-10-CM | POA: Diagnosis not present

## 2014-07-28 DIAGNOSIS — M48061 Spinal stenosis, lumbar region without neurogenic claudication: Secondary | ICD-10-CM

## 2014-07-28 DIAGNOSIS — R269 Unspecified abnormalities of gait and mobility: Secondary | ICD-10-CM | POA: Diagnosis not present

## 2014-07-28 NOTE — Progress Notes (Signed)
PATIENT: [name] DOB: [birth year]  HISTORICAL  [name] is a 79 years old right-handed female, referred by her primary care physician Dr. Hulan Fray for evaluation of peripheral neuropathy.  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  Last evaluation was by [facility] Dr. Jalene Mullet, most repeat MRI brain was more than 20 years ago.  She also PMHx of HTN, HLD, she also has a history of moderate stenosis at right internal carotid artery by ultrasound Doppler study, repeat evaluation showed no significant change over the years, last evaluation was in 2013.  She complains of bilateral feet paresthesia since 2009, she noticed sudden onset of low back pain, shooting pain to her right leg, that she could not bear weight, was found to have lumbar degenerative disc disease, lumbar stenosis, was given epidural injection, last was 2010.   In 2010, she has EMG/NCS at [facility] for low back pain and bilateral feet paresthesia,  was told that she had left foot tarsal tunnel syndrome, then she noticed right plantar feet numbness, tingling then.  Her bilateral feet paresthesia has slow progressed over the years, she noticed more numbness, still stay at plantar, difficulty abducting her toes.   She also noticed mild gait difficulty, no significant low back pain, no shooting pain to her legs, no bowel and bladder incontinence.    She suffered right radial neuropathy when she suffered right humeral fracture in 2013, after tripped and fell, no surgery required, mild right dorsum hand numbness, no significant right hand weakness now.  She denies neck pain,   We have reviewed MRI lumbar in 2009, Multifactorial significant spinal stenosis noted from the L2-3 through L5 S level. It is difficult to state which level  may be  responsible for the patient's symptoms. The disc protrusion is slightly more prominent on the right at the L2-3 and L5 S1 level and on the left at the L3-4 and L4-5 level.  UPDATE July 30th 2015:  We have reviewed MRI lumbarAbnormal MRI lumbar spine (without) demonstrating:  1. Severe spinal stenosis from L2-3 to L5-S1.  2. Moderate spinal stenosis at L1-2.  3. Multi-level foraminal stenosis from L1-2 to L5-S1.  4. Right renal cyst 1.9cm.  5. Compared to prior MRI on 10/25/07, there has been mild progression of degenerative spine disease. Also, right renal cyst has enlarged from 1.0 to 1.9 cm in diameter.  Electrodiagnostic study January 07 2014: has demonstrated list dependent mild axonal peripheral neuropathy, there was also evidence of chronic neuropathic changes humanity right L5-S1 myotomes, consistent with his right chronic lumbar radiculopathy   She has mild unsteady gait, the most bothersome symptoms is bilateral feet numbness, she denies bowel bladder incontinence,  After discussion, I suggested continued conservative treatment, defer neurosurgical evaluation for her multilevel lumbar spinal stenosis  Laboratory evaluation revealed a normal CMP, anemia hemoglobin 10. 2, this is under supervision of her primary care physician Dr.Aroson,  LDL 113 normal TSH, vitamin D 40.7  UPDATE Feb 17th 2016: She is with her husband at today's clinical visit, she had a history of complex partial seizure, due to left frontal area vascular malformation, this was confirmed by angiogram at [name] when she was at age 32, her seizures started at right hand, traveling upwards, sometimes with loss of consciousness, last a seizure  was more than 46 years ago,  She has been taking  Phenobarbital 32.4mg  2 tabs qhs and Dilantin 100mg  2 tabs qhs for more than 40 years, is afraid of change,  She has gradually worsening gait difficulty, fell, with right shoulder fracture in 2013, she denies significant  low back pain, right shoulder pain, I have reviewed MRI lumbar with patient and her husband,  REVIEW OF SYSTEMS: Full 14 system review of systems performed and notable only for joints pain, allergies, runny nose, numbness, weakness, restless.  ALLERGIES: Allergies  Allergen Reactions  . Naproxen Sodium     REACTION: rash  . Penicillins     REACTION: hives  . Pravastatin   . Sulfa Drugs Cross Reactors     HOME MEDICATIONS: Current Outpatient Prescriptions on File Prior to Visit  Medication Sig Dispense Refill  . Calcium Carbonate-Vitamin D (CALTRATE 600+D PO) Take by mouth daily.       . Ibuprofen (ADVIL PO) Take by mouth as needed.        . mometasone (ELOCON) 0.1 % cream Apply 1 application topically as needed.        . Multiple Vitamin (MULTIVITAMIN) tablet Take 1 tablet by mouth daily.        . nebivolol (BYSTOLIC) 10 MG tablet Take 1 tablet (10 mg total) by mouth daily.  90 tablet  3  . PHENobarbital (LUMINAL) 32.4 MG tablet Take 32.4 mg by mouth daily.       . phenytoin (DILANTIN) 100 MG ER capsule Take 100 mg by mouth daily.       Vladimir Faster Glycol-Propyl Glycol (SYSTANE OP) Apply to eye.      . pyridOXINE (VITAMIN B-6) 50 MG tablet Take 50 mg by mouth daily.      . simvastatin (ZOCOR) 20 MG tablet Take 20 mg by mouth at bedtime.           PAST MEDICAL HISTORY: Past Medical History  Diagnosis Date  . Numbness     LEFT FOOT AND ANKLE  . Hypertension   . Hypercholesterolemia   . Carotid arterial disease     MODERATE RIGHT  . Cerebral AV malformation   . DDD (degenerative disc disease)     SEVERE WITH SPINAL STENOSIS  . Osteopenia   . Pseudo-gout   . MVP (mitral valve prolapse)     MILD  . Abnormal cardiovascular stress test     PAST SURGICAL HISTORY: Past Surgical History  Procedure Laterality Date  . Carpal tunnel release    . Appendectomy    . Tonsillectomy    . Removal of parathyroid gland    . Breast mass excision    . Stress nuclear study       ABNORMAL    FAMILY HISTORY: Family History  Problem Relation Age of Onset  . Stroke Mother   . Lung cancer Father    SOCIAL HISTORY:  History   Social History  . Marital Status: Married    Spouse Name: N/A    Number of Children: 3  . Years of Education: N/A   Occupational History  . Not on file.   Social History Main Topics  . Smoking status: Former Smoker -- 1.00 packs/day for 14 years    Types: Cigarettes    Quit date: 11/07/1968  . Smokeless tobacco: Never Used  . Alcohol Use: No  . Drug Use: No  . Sexual Activity: Not on file   Other Topics Concern  . Retired Education officer, museum  Social History Narrative  . No narrative on file     PHYSICAL EXAM   Filed Vitals:   07/28/14 1131  BP: 183/76  Pulse: 92  Height: 5\' 6"  (1.676 m)  Weight: 143 lb (64.864 kg)    Not recorded      Body mass index is 23.09 kg/(m^2).   Generalized: In no acute distress  Neck: Supple, no carotid bruits   Cardiac: Regular rate rhythm  Pulmonary: Clear to auscultation bilaterally  Musculoskeletal: She has scoliosis, deformed left hand joints, limited range of motion of right shoulder  Neurological examination  Mentation: Alert oriented to time, place, history taking, and causual conversation  Cranial nerve II-XII: Pupils were equal round reactive to light. Extraocular movements were full.  Visual field were full on confrontational test. Bilateral fundi were sharp, static right ptosis.  Facial sensation and strength were normal. Hearing was intact to finger rubbing bilaterally. Uvula tongue midline.  Head turning and shoulder shrug and were normal and symmetric.Tongue protrusion into cheek strength was normal.  Motor: Ltd. range of motion of right shoulder, mild right shoulder abduction, external rotation, elbow flexion, moderate right shoulder extension weakness, also has mild right supination, wrist flexion, finger flexion weakness, mild to moderate bilateral ankle  dorsiflexion weakness.  Sensory: Iength dependent decreased fine touch, pinprick ankle level, absent toe vibratory sensation  Coordination: Normal finger to nose, heel-to-shin bilaterally there was no truncal ataxia  Gait: Mildly unsteady, cautious gait, scoliosis, upper body lean  towards the left side, could not stand up on tiptoe, or heels,  Romberg signs: Negative  Deep tendon reflexes: Brachioradialis 2/2, biceps 2/2, triceps 2/2, patellar 2/2, Achilles absent, plantar responses were flexor bilaterally.   DIAGNOSTIC DATA (LABS, IMAGING, TESTING) - I reviewed patient records, labs, notes, testing and imaging myself where available.  ASSESSMENT AND PLAN  LEVETTE PAULICK is a 79 y.o. female with known history of severe multilevel lumbar stenosis, now presenting with worsening bilateral feet paresthesia, six-month history of worsening gait difficulty, also suffered right humeral fracture, with evidence of  right radial neuropathy, on examination, she has scoliosis, mild distal leg weakness, length dependent sensory changes,  Consistent with  lumbar stenosis with superimposed peripheral neuropathy, with EMG nerve conduction study, and MRI findings detailed above Complex partial seizure due to left frontal area vascular malformation,  she has been on long-term,  phenobarbital, Dilantin treatment, I have suggested your age and treatment for her well controlled partial seizure, she is hesitate to change her antiepileptical medications, prefer to have it done at [facility], return to clinic for new issues   Marcial Pacas, M.D. Ph.D.  [facility] [address], [place],  [address] [phone]

## 2014-07-29 ENCOUNTER — Ambulatory Visit
Admission: RE | Admit: 2014-07-29 | Discharge: 2014-07-29 | Disposition: A | Discharge status: Home / Self Care | Payer: Medicare Other | Source: Ambulatory Visit

## 2014-07-29 DIAGNOSIS — Z1231 Encounter for screening mammogram for malignant neoplasm of breast: Secondary | ICD-10-CM | POA: Diagnosis not present

## 2014-08-09 DIAGNOSIS — Z1389 Encounter for screening for other disorder: Secondary | ICD-10-CM | POA: Diagnosis not present

## 2014-08-09 DIAGNOSIS — Z6823 Body mass index (BMI) 23.0-23.9, adult: Secondary | ICD-10-CM | POA: Diagnosis not present

## 2014-08-09 DIAGNOSIS — M1990 Unspecified osteoarthritis, unspecified site: Secondary | ICD-10-CM | POA: Diagnosis not present

## 2014-08-09 DIAGNOSIS — M4800 Spinal stenosis, site unspecified: Secondary | ICD-10-CM | POA: Diagnosis not present

## 2014-08-09 DIAGNOSIS — I10 Essential (primary) hypertension: Secondary | ICD-10-CM | POA: Diagnosis not present

## 2014-08-09 DIAGNOSIS — M8580 Other specified disorders of bone density and structure, unspecified site: Secondary | ICD-10-CM | POA: Diagnosis not present

## 2014-08-09 DIAGNOSIS — Q2730 Arteriovenous malformation, site unspecified: Secondary | ICD-10-CM | POA: Diagnosis not present

## 2014-08-09 DIAGNOSIS — G629 Polyneuropathy, unspecified: Secondary | ICD-10-CM | POA: Diagnosis not present

## 2014-08-09 DIAGNOSIS — D649 Anemia, unspecified: Secondary | ICD-10-CM | POA: Diagnosis not present

## 2014-08-09 DIAGNOSIS — E785 Hyperlipidemia, unspecified: Secondary | ICD-10-CM | POA: Diagnosis not present

## 2014-08-09 DIAGNOSIS — R569 Unspecified convulsions: Secondary | ICD-10-CM | POA: Diagnosis not present

## 2014-08-09 DIAGNOSIS — E042 Nontoxic multinodular goiter: Secondary | ICD-10-CM | POA: Diagnosis not present

## 2014-09-14 DIAGNOSIS — H9209 Otalgia, unspecified ear: Secondary | ICD-10-CM | POA: Diagnosis not present

## 2014-09-14 DIAGNOSIS — Z1389 Encounter for screening for other disorder: Secondary | ICD-10-CM | POA: Diagnosis not present

## 2014-09-14 DIAGNOSIS — Z6822 Body mass index (BMI) 22.0-22.9, adult: Secondary | ICD-10-CM | POA: Diagnosis not present

## 2014-09-14 DIAGNOSIS — Z01419 Encounter for gynecological examination (general) (routine) without abnormal findings: Secondary | ICD-10-CM | POA: Diagnosis not present

## 2014-09-14 DIAGNOSIS — H6120 Impacted cerumen, unspecified ear: Secondary | ICD-10-CM | POA: Diagnosis not present

## 2014-10-19 DIAGNOSIS — D2261 Melanocytic nevi of right upper limb, including shoulder: Secondary | ICD-10-CM | POA: Diagnosis not present

## 2014-10-19 DIAGNOSIS — I788 Other diseases of capillaries: Secondary | ICD-10-CM | POA: Diagnosis not present

## 2014-10-19 DIAGNOSIS — Z85828 Personal history of other malignant neoplasm of skin: Secondary | ICD-10-CM | POA: Diagnosis not present

## 2014-10-19 DIAGNOSIS — D225 Melanocytic nevi of trunk: Secondary | ICD-10-CM | POA: Diagnosis not present

## 2014-10-19 DIAGNOSIS — L821 Other seborrheic keratosis: Secondary | ICD-10-CM | POA: Diagnosis not present

## 2014-10-19 DIAGNOSIS — D1801 Hemangioma of skin and subcutaneous tissue: Secondary | ICD-10-CM | POA: Diagnosis not present

## 2014-10-19 DIAGNOSIS — D220 Melanocytic nevi of lip: Secondary | ICD-10-CM | POA: Diagnosis not present

## 2014-10-19 DIAGNOSIS — D2271 Melanocytic nevi of right lower limb, including hip: Secondary | ICD-10-CM | POA: Diagnosis not present

## 2014-10-25 DIAGNOSIS — N390 Urinary tract infection, site not specified: Secondary | ICD-10-CM | POA: Diagnosis not present

## 2014-10-25 DIAGNOSIS — R8290 Unspecified abnormal findings in urine: Secondary | ICD-10-CM | POA: Diagnosis not present

## 2014-11-19 DIAGNOSIS — R569 Unspecified convulsions: Secondary | ICD-10-CM | POA: Diagnosis not present

## 2014-11-19 DIAGNOSIS — I10 Essential (primary) hypertension: Secondary | ICD-10-CM | POA: Diagnosis not present

## 2014-11-19 DIAGNOSIS — E785 Hyperlipidemia, unspecified: Secondary | ICD-10-CM | POA: Diagnosis not present

## 2014-11-19 DIAGNOSIS — E042 Nontoxic multinodular goiter: Secondary | ICD-10-CM | POA: Diagnosis not present

## 2014-11-19 DIAGNOSIS — M859 Disorder of bone density and structure, unspecified: Secondary | ICD-10-CM | POA: Diagnosis not present

## 2014-11-24 DIAGNOSIS — Z1212 Encounter for screening for malignant neoplasm of rectum: Secondary | ICD-10-CM | POA: Diagnosis not present

## 2014-11-26 DIAGNOSIS — M1990 Unspecified osteoarthritis, unspecified site: Secondary | ICD-10-CM | POA: Diagnosis not present

## 2014-11-26 DIAGNOSIS — Z6822 Body mass index (BMI) 22.0-22.9, adult: Secondary | ICD-10-CM | POA: Diagnosis not present

## 2014-11-26 DIAGNOSIS — D649 Anemia, unspecified: Secondary | ICD-10-CM | POA: Diagnosis not present

## 2014-11-26 DIAGNOSIS — H9190 Unspecified hearing loss, unspecified ear: Secondary | ICD-10-CM | POA: Diagnosis not present

## 2014-11-26 DIAGNOSIS — I10 Essential (primary) hypertension: Secondary | ICD-10-CM | POA: Diagnosis not present

## 2014-11-26 DIAGNOSIS — R569 Unspecified convulsions: Secondary | ICD-10-CM | POA: Diagnosis not present

## 2014-11-26 DIAGNOSIS — G629 Polyneuropathy, unspecified: Secondary | ICD-10-CM | POA: Diagnosis not present

## 2014-11-26 DIAGNOSIS — E785 Hyperlipidemia, unspecified: Secondary | ICD-10-CM | POA: Diagnosis not present

## 2014-11-26 DIAGNOSIS — Z0000 Encounter for general adult medical examination without abnormal findings: Secondary | ICD-10-CM | POA: Diagnosis not present

## 2014-11-26 DIAGNOSIS — M859 Disorder of bone density and structure, unspecified: Secondary | ICD-10-CM | POA: Diagnosis not present

## 2014-11-26 DIAGNOSIS — Z23 Encounter for immunization: Secondary | ICD-10-CM | POA: Diagnosis not present

## 2015-01-04 DIAGNOSIS — H6123 Impacted cerumen, bilateral: Secondary | ICD-10-CM | POA: Diagnosis not present

## 2015-01-04 DIAGNOSIS — H903 Sensorineural hearing loss, bilateral: Secondary | ICD-10-CM | POA: Diagnosis not present

## 2015-01-04 DIAGNOSIS — H61303 Acquired stenosis of external ear canal, unspecified, bilateral: Secondary | ICD-10-CM | POA: Diagnosis not present

## 2015-03-05 DIAGNOSIS — Z23 Encounter for immunization: Secondary | ICD-10-CM | POA: Diagnosis not present

## 2015-03-16 ENCOUNTER — Other Ambulatory Visit: Payer: Self-pay | Admitting: Cardiology

## 2015-03-16 NOTE — Telephone Encounter (Signed)
REFILL 

## 2015-04-05 DIAGNOSIS — H52203 Unspecified astigmatism, bilateral: Secondary | ICD-10-CM | POA: Diagnosis not present

## 2015-04-05 DIAGNOSIS — Z961 Presence of intraocular lens: Secondary | ICD-10-CM | POA: Diagnosis not present

## 2015-04-05 DIAGNOSIS — H26492 Other secondary cataract, left eye: Secondary | ICD-10-CM | POA: Diagnosis not present

## 2015-04-05 DIAGNOSIS — H02403 Unspecified ptosis of bilateral eyelids: Secondary | ICD-10-CM | POA: Diagnosis not present

## 2015-04-22 ENCOUNTER — Ambulatory Visit: Payer: Self-pay | Admitting: Cardiology

## 2015-04-28 ENCOUNTER — Ambulatory Visit (INDEPENDENT_AMBULATORY_CARE_PROVIDER_SITE_OTHER): Payer: Medicare Other | Admitting: Cardiology

## 2015-04-28 ENCOUNTER — Encounter: Payer: Self-pay | Admitting: Cardiology

## 2015-04-28 VITALS — BP 174/60 | HR 70 | Ht 66.0 in | Wt 140.9 lb

## 2015-04-28 DIAGNOSIS — I779 Disorder of arteries and arterioles, unspecified: Secondary | ICD-10-CM | POA: Diagnosis not present

## 2015-04-28 DIAGNOSIS — I10 Essential (primary) hypertension: Secondary | ICD-10-CM | POA: Diagnosis not present

## 2015-04-28 DIAGNOSIS — I739 Peripheral vascular disease, unspecified: Secondary | ICD-10-CM

## 2015-04-28 DIAGNOSIS — I341 Nonrheumatic mitral (valve) prolapse: Secondary | ICD-10-CM | POA: Diagnosis not present

## 2015-04-28 MED ORDER — CARVEDILOL 25 MG PO TABS: 25.0000 mg | ORAL_TABLET | Freq: Two times a day (BID) | ORAL | Status: DC

## 2015-04-28 NOTE — Patient Instructions (Signed)
Increase Coreg to 25 mg twice a day  I will see you in one year.

## 2015-04-28 NOTE — Progress Notes (Signed)
[name] Date of Birth: 1935-07-13   History of Present Illness: [name] is seen for yearly followup. She has a history of mildly abnormal myoview study in 2010. She has HTN. She has been treated conservatively and has done well.  She denies any chest pain or shortness of breath.  She was switched from Bystolic to Coreg due to cost. She reports some recent stressors with husband diagnosed with dementia. She is considering a move to [place] to be closer to her daughter.  Current Outpatient Prescriptions on File Prior to Visit  Medication Sig Dispense Refill  . Calcium Carbonate-Vitamin D (CALTRATE 600+D PO) Take by mouth daily.     . Ibuprofen (ADVIL PO) Take by mouth as needed.      . mometasone (ELOCON) 0.1 % cream Apply 1 application topically as needed.      . Multiple Vitamin (MULTIVITAMIN) tablet Take 1 tablet by mouth daily.      Marland Kitchen PHENobarbital (LUMINAL) 32.4 MG tablet Take 64.8 mg by mouth daily.     . phenytoin (DILANTIN) 100 MG ER capsule Take 200 mg by mouth daily.     Marland Kitchen pyridOXINE (VITAMIN B-6) 50 MG tablet Take 50 mg by mouth daily.    . simvastatin (ZOCOR) 20 MG tablet Take 20 mg by mouth at bedtime.       No current facility-administered medications on file prior to visit.    Allergies  Allergen Reactions  . Naproxen Sodium     REACTION: rash  . Penicillins     REACTION: hives  . Pravastatin   . Sulfa Drugs Cross Reactors     Past Medical History  Diagnosis Date  . Numbness     LEFT FOOT AND ANKLE  . Hypertension   . Hypercholesterolemia   . Carotid arterial disease (HCC)     MODERATE RIGHT  . Cerebral AV malformation   . DDD (degenerative disc disease)     SEVERE WITH SPINAL STENOSIS  . Osteopenia   . Pseudo-gout   . MVP (mitral valve prolapse)     MILD  . Abnormal cardiovascular stress test     Past Surgical History  Procedure Laterality Date  . Carpal tunnel release    . Appendectomy    . Tonsillectomy    . Removal of  parathyroid gland    . Breast mass excision    . Stress nuclear study      ABNORMAL    History  Smoking status  . Former Smoker -- 1.00 packs/day for 14 years  . Types: Cigarettes  . Quit date: 11/07/1968  Smokeless tobacco  . Never Used    History  Alcohol Use No    Family History  Problem Relation Age of Onset  . Stroke Mother   . Lung cancer Father     Review of Systems: As the history of present illness.  All other systems were reviewed and are negative.  Physical Exam: BP 174/60 mmHg  Pulse 70  Ht 5\' 6"  (1.676 m)  Wt 63.912 kg (140 lb 14.4 oz)  BMI 22.75 kg/m2 She is a pleasant elderly female in no acute distress. HEENT exam is unremarkable. She has no JVD. She has bilateral carotid bruits. Lungs are clear. Cardiac exam reveals a grade 1/6 systolic murmur at the left sternal border. Abdomen is soft and nontender. She has no edema. She has excellent pedal pulses.  LABORATORY DATA: Ecg today shows NSR with normal Ecg. I have personally reviewed and interpreted this  study.   Assessment / Plan: 1. Hypertension-blood pressure is elevated today. We will go ahead and increase Coreg to 25 mg bid. Monitor at home.  2.  Hyperlipidemia.  3. Mildly abnormal stress test in 2010. Unclear if this is artifact. Patient has remained asymptomatic. We'll continue risk factor modification.

## 2015-05-24 ENCOUNTER — Encounter: Payer: Self-pay | Admitting: Internal Medicine

## 2015-06-24 DIAGNOSIS — M859 Disorder of bone density and structure, unspecified: Secondary | ICD-10-CM | POA: Diagnosis not present

## 2015-06-24 DIAGNOSIS — R569 Unspecified convulsions: Secondary | ICD-10-CM | POA: Diagnosis not present

## 2015-06-24 DIAGNOSIS — D649 Anemia, unspecified: Secondary | ICD-10-CM | POA: Diagnosis not present

## 2015-06-24 DIAGNOSIS — Z1389 Encounter for screening for other disorder: Secondary | ICD-10-CM | POA: Diagnosis not present

## 2015-06-24 DIAGNOSIS — G629 Polyneuropathy, unspecified: Secondary | ICD-10-CM | POA: Diagnosis not present

## 2015-06-24 DIAGNOSIS — I10 Essential (primary) hypertension: Secondary | ICD-10-CM | POA: Diagnosis not present

## 2015-06-24 DIAGNOSIS — Z6822 Body mass index (BMI) 22.0-22.9, adult: Secondary | ICD-10-CM | POA: Diagnosis not present

## 2015-06-24 DIAGNOSIS — H9190 Unspecified hearing loss, unspecified ear: Secondary | ICD-10-CM | POA: Diagnosis not present

## 2015-06-24 DIAGNOSIS — E042 Nontoxic multinodular goiter: Secondary | ICD-10-CM | POA: Diagnosis not present

## 2015-06-24 DIAGNOSIS — Q2730 Arteriovenous malformation, site unspecified: Secondary | ICD-10-CM | POA: Diagnosis not present

## 2015-06-24 DIAGNOSIS — M4800 Spinal stenosis, site unspecified: Secondary | ICD-10-CM | POA: Diagnosis not present

## 2015-06-24 DIAGNOSIS — E784 Other hyperlipidemia: Secondary | ICD-10-CM | POA: Diagnosis not present

## 2015-06-29 ENCOUNTER — Other Ambulatory Visit: Payer: Self-pay

## 2015-06-29 DIAGNOSIS — Z1231 Encounter for screening mammogram for malignant neoplasm of breast: Secondary | ICD-10-CM

## 2015-07-19 ENCOUNTER — Ambulatory Visit: Payer: Medicare Other | Admitting: Neurology

## 2015-08-03 ENCOUNTER — Ambulatory Visit
Admission: RE | Admit: 2015-08-03 | Discharge: 2015-08-03 | Disposition: A | Discharge status: Home / Self Care | Payer: Medicare Other | Source: Ambulatory Visit

## 2015-08-03 DIAGNOSIS — Z1231 Encounter for screening mammogram for malignant neoplasm of breast: Secondary | ICD-10-CM | POA: Diagnosis not present

## 2015-08-18 ENCOUNTER — Ambulatory Visit: Payer: Medicare Other | Admitting: Neurology

## 2015-09-27 DIAGNOSIS — Z124 Encounter for screening for malignant neoplasm of cervix: Secondary | ICD-10-CM | POA: Diagnosis not present

## 2015-09-27 DIAGNOSIS — Z6823 Body mass index (BMI) 23.0-23.9, adult: Secondary | ICD-10-CM | POA: Diagnosis not present

## 2015-09-27 DIAGNOSIS — Z01419 Encounter for gynecological examination (general) (routine) without abnormal findings: Secondary | ICD-10-CM | POA: Diagnosis not present

## 2015-12-06 DIAGNOSIS — D2239 Melanocytic nevi of other parts of face: Secondary | ICD-10-CM | POA: Diagnosis not present

## 2015-12-06 DIAGNOSIS — L821 Other seborrheic keratosis: Secondary | ICD-10-CM | POA: Diagnosis not present

## 2015-12-06 DIAGNOSIS — D225 Melanocytic nevi of trunk: Secondary | ICD-10-CM | POA: Diagnosis not present

## 2015-12-06 DIAGNOSIS — D2271 Melanocytic nevi of right lower limb, including hip: Secondary | ICD-10-CM | POA: Diagnosis not present

## 2015-12-06 DIAGNOSIS — D2272 Melanocytic nevi of left lower limb, including hip: Secondary | ICD-10-CM | POA: Diagnosis not present

## 2015-12-06 DIAGNOSIS — D485 Neoplasm of uncertain behavior of skin: Secondary | ICD-10-CM | POA: Diagnosis not present

## 2015-12-06 DIAGNOSIS — L84 Corns and callosities: Secondary | ICD-10-CM | POA: Diagnosis not present

## 2015-12-06 DIAGNOSIS — Z85828 Personal history of other malignant neoplasm of skin: Secondary | ICD-10-CM | POA: Diagnosis not present

## 2015-12-23 DIAGNOSIS — E784 Other hyperlipidemia: Secondary | ICD-10-CM | POA: Diagnosis not present

## 2015-12-23 DIAGNOSIS — N390 Urinary tract infection, site not specified: Secondary | ICD-10-CM | POA: Diagnosis not present

## 2015-12-23 DIAGNOSIS — E042 Nontoxic multinodular goiter: Secondary | ICD-10-CM | POA: Diagnosis not present

## 2015-12-23 DIAGNOSIS — R8290 Unspecified abnormal findings in urine: Secondary | ICD-10-CM | POA: Diagnosis not present

## 2015-12-23 DIAGNOSIS — I10 Essential (primary) hypertension: Secondary | ICD-10-CM | POA: Diagnosis not present

## 2015-12-23 DIAGNOSIS — R569 Unspecified convulsions: Secondary | ICD-10-CM | POA: Diagnosis not present

## 2016-01-04 DIAGNOSIS — H6123 Impacted cerumen, bilateral: Secondary | ICD-10-CM | POA: Diagnosis not present

## 2016-01-04 DIAGNOSIS — H61303 Acquired stenosis of external ear canal, unspecified, bilateral: Secondary | ICD-10-CM | POA: Diagnosis not present

## 2016-01-04 DIAGNOSIS — H903 Sensorineural hearing loss, bilateral: Secondary | ICD-10-CM | POA: Diagnosis not present

## 2016-01-06 DIAGNOSIS — E042 Nontoxic multinodular goiter: Secondary | ICD-10-CM | POA: Diagnosis not present

## 2016-01-06 DIAGNOSIS — I10 Essential (primary) hypertension: Secondary | ICD-10-CM | POA: Diagnosis not present

## 2016-01-06 DIAGNOSIS — M1990 Unspecified osteoarthritis, unspecified site: Secondary | ICD-10-CM | POA: Diagnosis not present

## 2016-01-06 DIAGNOSIS — M859 Disorder of bone density and structure, unspecified: Secondary | ICD-10-CM | POA: Diagnosis not present

## 2016-01-06 DIAGNOSIS — E784 Other hyperlipidemia: Secondary | ICD-10-CM | POA: Diagnosis not present

## 2016-01-06 DIAGNOSIS — Q2730 Arteriovenous malformation, site unspecified: Secondary | ICD-10-CM | POA: Diagnosis not present

## 2016-01-06 DIAGNOSIS — M4800 Spinal stenosis, site unspecified: Secondary | ICD-10-CM | POA: Diagnosis not present

## 2016-01-06 DIAGNOSIS — D6489 Other specified anemias: Secondary | ICD-10-CM | POA: Diagnosis not present

## 2016-01-06 DIAGNOSIS — Z0000 Encounter for general adult medical examination without abnormal findings: Secondary | ICD-10-CM | POA: Diagnosis not present

## 2016-01-06 DIAGNOSIS — Z1212 Encounter for screening for malignant neoplasm of rectum: Secondary | ICD-10-CM | POA: Diagnosis not present

## 2016-01-06 DIAGNOSIS — Z6823 Body mass index (BMI) 23.0-23.9, adult: Secondary | ICD-10-CM | POA: Diagnosis not present

## 2016-01-06 DIAGNOSIS — G629 Polyneuropathy, unspecified: Secondary | ICD-10-CM | POA: Diagnosis not present

## 2016-02-25 DIAGNOSIS — Z23 Encounter for immunization: Secondary | ICD-10-CM | POA: Diagnosis not present

## 2016-05-14 DIAGNOSIS — H40011 Open angle with borderline findings, low risk, right eye: Secondary | ICD-10-CM | POA: Diagnosis not present

## 2016-05-14 DIAGNOSIS — H40012 Open angle with borderline findings, low risk, left eye: Secondary | ICD-10-CM | POA: Diagnosis not present

## 2016-05-14 DIAGNOSIS — H524 Presbyopia: Secondary | ICD-10-CM | POA: Diagnosis not present

## 2016-05-14 DIAGNOSIS — H26492 Other secondary cataract, left eye: Secondary | ICD-10-CM | POA: Diagnosis not present

## 2016-05-16 NOTE — Progress Notes (Deleted)
[name] Date of Birth: 08/07/1935   History of Present Illness: [name] is seen for yearly followup. She has a history of mildly abnormal myoview study in 2010. She has HTN. She has been treated conservatively and has done well.  She denies any chest pain or shortness of breath.  She was switched from Bystolic to Coreg due to cost. She reports some recent stressors with husband diagnosed with dementia. She is considering a move to [place] to be closer to her daughter.  Current Outpatient Prescriptions on File Prior to Visit  Medication Sig Dispense Refill  . Calcium Carbonate-Vitamin D (CALTRATE 600+D PO) Take by mouth daily.     . carvedilol (COREG) 25 MG tablet Take 1 tablet (25 mg total) by mouth 2 (two) times daily. 180 tablet 3  . Ibuprofen (ADVIL PO) Take by mouth as needed.      . mometasone (ELOCON) 0.1 % cream Apply 1 application topically as needed.      . Multiple Vitamin (MULTIVITAMIN) tablet Take 1 tablet by mouth daily.      Marland Kitchen PHENobarbital (LUMINAL) 32.4 MG tablet Take 64.8 mg by mouth daily.     . phenytoin (DILANTIN) 100 MG ER capsule Take 200 mg by mouth daily.     Marland Kitchen pyridOXINE (VITAMIN B-6) 50 MG tablet Take 50 mg by mouth daily.    . simvastatin (ZOCOR) 20 MG tablet Take 20 mg by mouth at bedtime.       No current facility-administered medications on file prior to visit.     Allergies  Allergen Reactions  . Naproxen Sodium     REACTION: rash  . Penicillins     REACTION: hives  . Pravastatin   . Sulfa Drugs Cross Reactors     Past Medical History:  Diagnosis Date  . Abnormal cardiovascular stress test   . Carotid arterial disease (HCC)    MODERATE RIGHT  . Cerebral AV malformation   . DDD (degenerative disc disease)    SEVERE WITH SPINAL STENOSIS  . Hypercholesterolemia   . Hypertension   . MVP (mitral valve prolapse)    MILD  . Numbness    LEFT FOOT AND ANKLE  . Osteopenia   . Pseudo-gout     Past Surgical History:  Procedure  Laterality Date  . APPENDECTOMY    . BREAST MASS EXCISION    . CARPAL TUNNEL RELEASE    . REMOVAL OF PARATHYROID GLAND    . STRESS NUCLEAR STUDY     ABNORMAL  . TONSILLECTOMY      History  Smoking Status  . Former Smoker  . Packs/day: 1.00  . Years: 14.00  . Types: Cigarettes  . Quit date: 11/07/1968  Smokeless Tobacco  . Never Used    History  Alcohol Use No    Family History  Problem Relation Age of Onset  . Stroke Mother   . Lung cancer Father     Review of Systems: As the history of present illness.  All other systems were reviewed and are negative.  Physical Exam: There were no vitals taken for this visit. She is a pleasant elderly female in no acute distress. HEENT exam is unremarkable. She has no JVD. She has bilateral carotid bruits. Lungs are clear. Cardiac exam reveals a grade 1/6 systolic murmur at the left sternal border. Abdomen is soft and nontender. She has no edema. She has excellent pedal pulses.  LABORATORY DATA: Ecg today shows NSR with normal Ecg. I have personally  reviewed and interpreted this study.  No results found for: WBC, HGB, HCT, PLT, GLUCOSE, CHOL, TRIG, HDL, LDLDIRECT, LDLCALC, ALT, AST, NA, K, CL, CREATININE, BUN, CO2, TSH, PSA, INR, GLUF, HGBA1C, MICROALBUR  Labs from primary care 12/23/15: cholesterol 204, triglycerides 43, HDL 85, LDL 110. Hgb 10.7. CMET and TSH normal.   Assessment / Plan: 1. Hypertension-blood pressure is elevated today. We will go ahead and increase Coreg to 25 mg bid. Monitor at home.  2.  Hyperlipidemia.  3. Mildly abnormal stress test in 2010. Unclear if this is artifact. Patient has remained asymptomatic. We'll continue risk factor modification.

## 2016-05-18 ENCOUNTER — Ambulatory Visit: Payer: Medicare Other | Admitting: Cardiology

## 2016-06-12 ENCOUNTER — Ambulatory Visit: Payer: Medicare Other | Admitting: Cardiology

## 2016-06-19 ENCOUNTER — Other Ambulatory Visit: Payer: Self-pay | Admitting: Cardiology

## 2016-06-19 DIAGNOSIS — I10 Essential (primary) hypertension: Secondary | ICD-10-CM

## 2016-07-12 NOTE — Progress Notes (Signed)
[name] Date of Birth: Nov 10, 1935   History of Present Illness: [name] is seen for yearly followup. She has a history of mildly abnormal myoview study in 2010. She has HTN. She has been treated conservatively and has done well.  She denies any chest pain or shortness of breath.  She was switched from Bystolic to Coreg due to cost. She notes her husband's dementia is trying. Some days he is very lucid and at other times quite "tangled". She does have chronic carotid bruits. Cannot remember when last dopplers were done.  Current Outpatient Prescriptions on File Prior to Visit  Medication Sig Dispense Refill  . Calcium Carbonate-Vitamin D (CALTRATE 600+D PO) Take by mouth daily.     . Ibuprofen (ADVIL PO) Take by mouth as needed.      . mometasone (ELOCON) 0.1 % cream Apply 1 application topically as needed.      . Multiple Vitamin (MULTIVITAMIN) tablet Take 1 tablet by mouth daily.      Marland Kitchen PHENobarbital (LUMINAL) 32.4 MG tablet Take 64.8 mg by mouth daily.     . phenytoin (DILANTIN) 100 MG ER capsule Take 200 mg by mouth daily.     Marland Kitchen pyridOXINE (VITAMIN B-6) 50 MG tablet Take 50 mg by mouth daily.    . simvastatin (ZOCOR) 20 MG tablet Take 20 mg by mouth at bedtime.       No current facility-administered medications on file prior to visit.     Allergies  Allergen Reactions  . Naproxen Sodium     REACTION: rash  . Penicillins     REACTION: hives  . Pravastatin   . Sulfa Drugs Cross Reactors     Past Medical History:  Diagnosis Date  . Abnormal cardiovascular stress test   . Carotid arterial disease (HCC)    MODERATE RIGHT  . Cerebral AV malformation   . DDD (degenerative disc disease)    SEVERE WITH SPINAL STENOSIS  . Hypercholesterolemia   . Hypertension   . MVP (mitral valve prolapse)    MILD  . Numbness    LEFT FOOT AND ANKLE  . Osteopenia   . Pseudo-gout     Past Surgical History:  Procedure Laterality Date  . APPENDECTOMY    . BREAST MASS  EXCISION    . CARPAL TUNNEL RELEASE    . REMOVAL OF PARATHYROID GLAND    . STRESS NUCLEAR STUDY     ABNORMAL  . TONSILLECTOMY      History  Smoking Status  . Former Smoker  . Packs/day: 1.00  . Years: 14.00  . Types: Cigarettes  . Quit date: 11/07/1968  Smokeless Tobacco  . Never Used    History  Alcohol Use No    Family History  Problem Relation Age of Onset  . Stroke Mother   . Lung cancer Father     Review of Systems: As the history of present illness.  All other systems were reviewed and are negative.  Physical Exam: BP (!) 142/82 (BP Location: Left Arm, Patient Position: Sitting, Cuff Size: Normal)   Pulse 82   Ht 5\' 6"  (1.676 m)   Wt 156 lb (70.8 kg)   BMI 25.18 kg/m  She is a pleasant elderly female in no acute distress. HEENT exam is unremarkable. She has no JVD. She has bilateral carotid bruits. Lungs are clear. Cardiac exam reveals a grade 1/6 systolic murmur at the left sternal border. Abdomen is soft and nontender. She has no edema. She has  excellent pedal pulses.  LABORATORY DATA: Labs dated 12/23/15: Cholesterol 204, triglycerides 43, HDL 85, LDL 110. CMET and TSH normal. Hgb 10.7.   Ecg today shows NSR with normal Ecg. LAD. I have personally reviewed and interpreted this study.   Assessment / Plan: 1. Hypertension-blood pressure is improved. Continue Coreg  25 mg bid. Monitor at home.  2.  Hyperlipidemia.  3. Mildly abnormal stress test in 2010. Unclear if this is artifact. Patient has remained asymptomatic. We'll continue risk factor modification.  4. Carotid bruits. Patient is going to check with Dr. Reynaldo Minium to see when last study done. If she is due we can schedule here.  Follow up in one year.

## 2016-07-13 ENCOUNTER — Ambulatory Visit (INDEPENDENT_AMBULATORY_CARE_PROVIDER_SITE_OTHER): Payer: Medicare Other | Admitting: Cardiology

## 2016-07-13 ENCOUNTER — Encounter: Payer: Self-pay | Admitting: Cardiology

## 2016-07-13 ENCOUNTER — Other Ambulatory Visit: Payer: Self-pay

## 2016-07-13 VITALS — BP 142/82 | HR 82 | Ht 66.0 in | Wt 156.0 lb

## 2016-07-13 DIAGNOSIS — I739 Peripheral vascular disease, unspecified: Secondary | ICD-10-CM

## 2016-07-13 DIAGNOSIS — I10 Essential (primary) hypertension: Secondary | ICD-10-CM | POA: Diagnosis not present

## 2016-07-13 DIAGNOSIS — I779 Disorder of arteries and arterioles, unspecified: Secondary | ICD-10-CM | POA: Diagnosis not present

## 2016-07-13 DIAGNOSIS — E7800 Pure hypercholesterolemia, unspecified: Secondary | ICD-10-CM | POA: Diagnosis not present

## 2016-07-13 MED ORDER — CARVEDILOL 25 MG PO TABS: 25.0000 mg | ORAL_TABLET | Freq: Two times a day (BID) | ORAL | 3 refills | Status: DC

## 2016-07-13 NOTE — Patient Instructions (Addendum)
Continue your current therapy  I will see you in 12 months.   

## 2016-07-16 DIAGNOSIS — E042 Nontoxic multinodular goiter: Secondary | ICD-10-CM | POA: Diagnosis not present

## 2016-07-16 DIAGNOSIS — E784 Other hyperlipidemia: Secondary | ICD-10-CM | POA: Diagnosis not present

## 2016-07-16 DIAGNOSIS — I10 Essential (primary) hypertension: Secondary | ICD-10-CM | POA: Diagnosis not present

## 2016-07-16 DIAGNOSIS — M1990 Unspecified osteoarthritis, unspecified site: Secondary | ICD-10-CM | POA: Diagnosis not present

## 2016-07-16 DIAGNOSIS — Z1389 Encounter for screening for other disorder: Secondary | ICD-10-CM | POA: Diagnosis not present

## 2016-07-16 DIAGNOSIS — M4800 Spinal stenosis, site unspecified: Secondary | ICD-10-CM | POA: Diagnosis not present

## 2016-07-16 DIAGNOSIS — Z6824 Body mass index (BMI) 24.0-24.9, adult: Secondary | ICD-10-CM | POA: Diagnosis not present

## 2016-07-16 DIAGNOSIS — M859 Disorder of bone density and structure, unspecified: Secondary | ICD-10-CM | POA: Diagnosis not present

## 2016-07-16 DIAGNOSIS — D6489 Other specified anemias: Secondary | ICD-10-CM | POA: Diagnosis not present

## 2016-07-16 DIAGNOSIS — G629 Polyneuropathy, unspecified: Secondary | ICD-10-CM | POA: Diagnosis not present

## 2016-07-16 DIAGNOSIS — Q2730 Arteriovenous malformation, site unspecified: Secondary | ICD-10-CM | POA: Diagnosis not present

## 2016-07-16 DIAGNOSIS — R569 Unspecified convulsions: Secondary | ICD-10-CM | POA: Diagnosis not present

## 2016-07-17 ENCOUNTER — Other Ambulatory Visit: Payer: Self-pay

## 2016-07-17 DIAGNOSIS — I739 Peripheral vascular disease, unspecified: Principal | ICD-10-CM

## 2016-07-17 DIAGNOSIS — I779 Disorder of arteries and arterioles, unspecified: Secondary | ICD-10-CM

## 2016-08-03 ENCOUNTER — Other Ambulatory Visit: Payer: Self-pay | Admitting: Internal Medicine

## 2016-08-03 DIAGNOSIS — Z1231 Encounter for screening mammogram for malignant neoplasm of breast: Secondary | ICD-10-CM

## 2016-08-08 ENCOUNTER — Ambulatory Visit (HOSPITAL_COMMUNITY)
Admission: RE | Admit: 2016-08-08 | Discharge: 2016-08-08 | Disposition: A | Discharge status: Home / Self Care | Payer: Medicare Other | Source: Ambulatory Visit | Attending: Cardiovascular Disease | Admitting: Cardiovascular Disease

## 2016-08-08 DIAGNOSIS — I739 Peripheral vascular disease, unspecified: Secondary | ICD-10-CM

## 2016-08-08 DIAGNOSIS — I779 Disorder of arteries and arterioles, unspecified: Secondary | ICD-10-CM | POA: Diagnosis not present

## 2016-08-08 DIAGNOSIS — I6523 Occlusion and stenosis of bilateral carotid arteries: Secondary | ICD-10-CM | POA: Insufficient documentation

## 2016-08-24 ENCOUNTER — Ambulatory Visit
Admission: RE | Admit: 2016-08-24 | Discharge: 2016-08-24 | Disposition: A | Discharge status: Home / Self Care | Payer: Medicare Other | Source: Ambulatory Visit | Attending: Internal Medicine | Admitting: Internal Medicine

## 2016-08-24 DIAGNOSIS — Z1231 Encounter for screening mammogram for malignant neoplasm of breast: Secondary | ICD-10-CM

## 2016-11-02 DIAGNOSIS — I10 Essential (primary) hypertension: Secondary | ICD-10-CM | POA: Diagnosis not present

## 2016-11-02 DIAGNOSIS — K5900 Constipation, unspecified: Secondary | ICD-10-CM | POA: Diagnosis not present

## 2016-11-02 DIAGNOSIS — E871 Hypo-osmolality and hyponatremia: Secondary | ICD-10-CM | POA: Diagnosis not present

## 2016-11-02 DIAGNOSIS — M859 Disorder of bone density and structure, unspecified: Secondary | ICD-10-CM | POA: Diagnosis not present

## 2016-11-02 DIAGNOSIS — R5383 Other fatigue: Secondary | ICD-10-CM | POA: Diagnosis not present

## 2016-11-02 DIAGNOSIS — Z6824 Body mass index (BMI) 24.0-24.9, adult: Secondary | ICD-10-CM | POA: Diagnosis not present

## 2016-11-06 DIAGNOSIS — R5383 Other fatigue: Secondary | ICD-10-CM | POA: Diagnosis not present

## 2016-11-06 DIAGNOSIS — E871 Hypo-osmolality and hyponatremia: Secondary | ICD-10-CM | POA: Diagnosis not present

## 2016-11-06 DIAGNOSIS — R569 Unspecified convulsions: Secondary | ICD-10-CM | POA: Diagnosis not present

## 2016-11-06 DIAGNOSIS — I10 Essential (primary) hypertension: Secondary | ICD-10-CM | POA: Diagnosis not present

## 2016-11-06 DIAGNOSIS — Z6824 Body mass index (BMI) 24.0-24.9, adult: Secondary | ICD-10-CM | POA: Diagnosis not present

## 2016-11-12 DIAGNOSIS — E871 Hypo-osmolality and hyponatremia: Secondary | ICD-10-CM | POA: Diagnosis not present

## 2016-11-12 DIAGNOSIS — N390 Urinary tract infection, site not specified: Secondary | ICD-10-CM | POA: Diagnosis not present

## 2016-11-12 DIAGNOSIS — R8290 Unspecified abnormal findings in urine: Secondary | ICD-10-CM | POA: Diagnosis not present

## 2017-01-14 DIAGNOSIS — I10 Essential (primary) hypertension: Secondary | ICD-10-CM | POA: Diagnosis not present

## 2017-01-14 DIAGNOSIS — E042 Nontoxic multinodular goiter: Secondary | ICD-10-CM | POA: Diagnosis not present

## 2017-01-14 DIAGNOSIS — M859 Disorder of bone density and structure, unspecified: Secondary | ICD-10-CM | POA: Diagnosis not present

## 2017-01-14 DIAGNOSIS — R569 Unspecified convulsions: Secondary | ICD-10-CM | POA: Diagnosis not present

## 2017-01-17 IMAGING — MG MM SCREENING BREAST TOMO BILATERAL
9 series · 9 of 25 positions shown · non-contrast
Comparison: Previous exam(s).

CLINICAL DATA: Screening.

EXAM:
DIGITAL SCREENING BILATERAL MAMMOGRAM WITH 3D TOMO WITH CAD

[R MLO (1 of 2)]
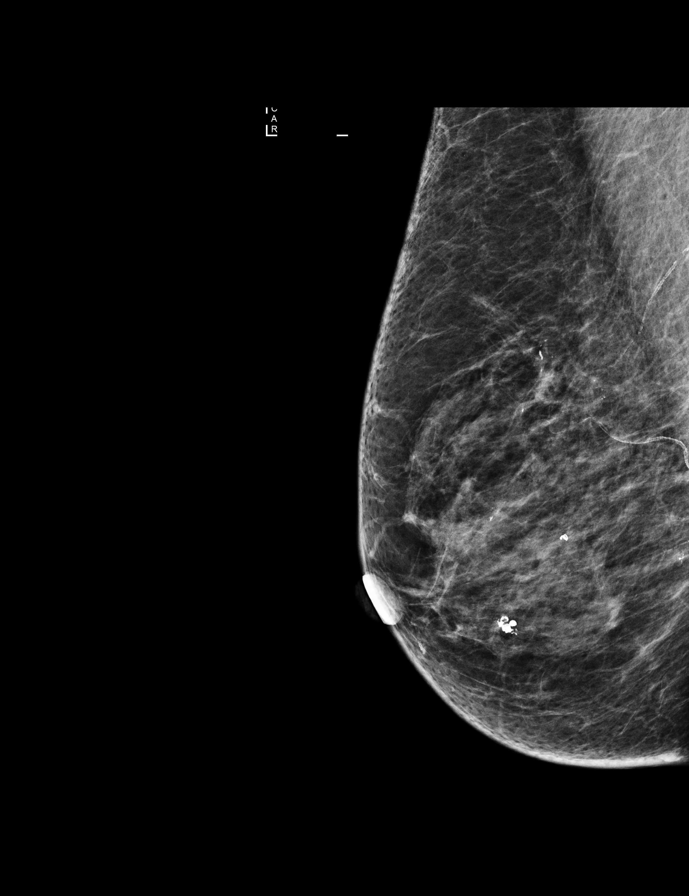

[L MLO]
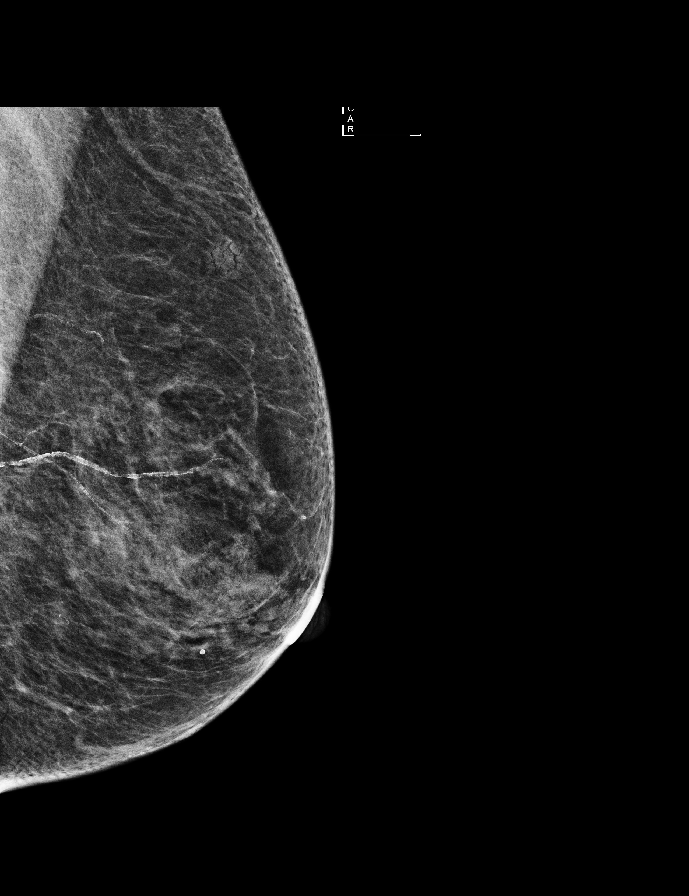

[R CC]
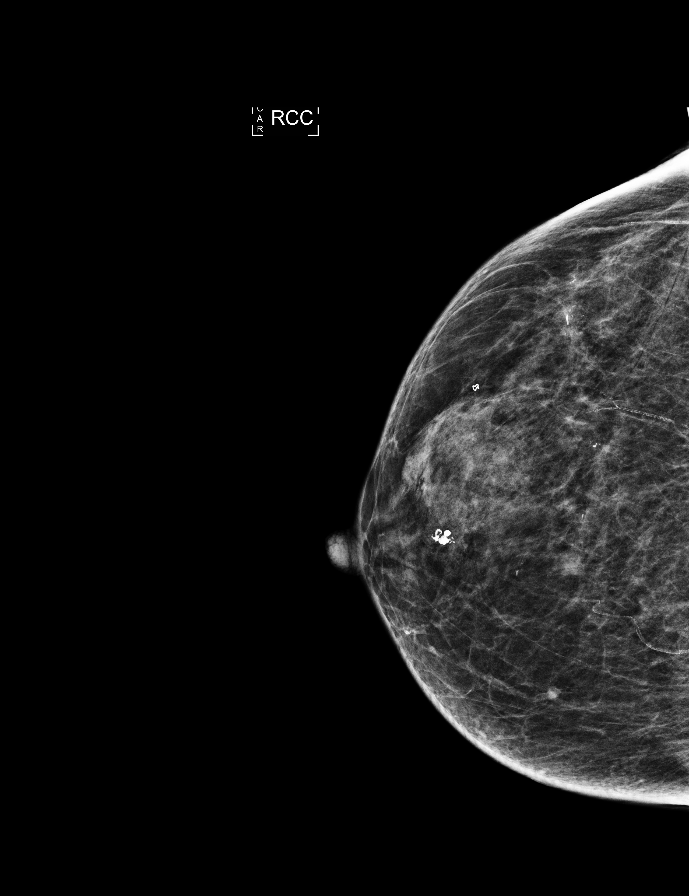

[L CC]
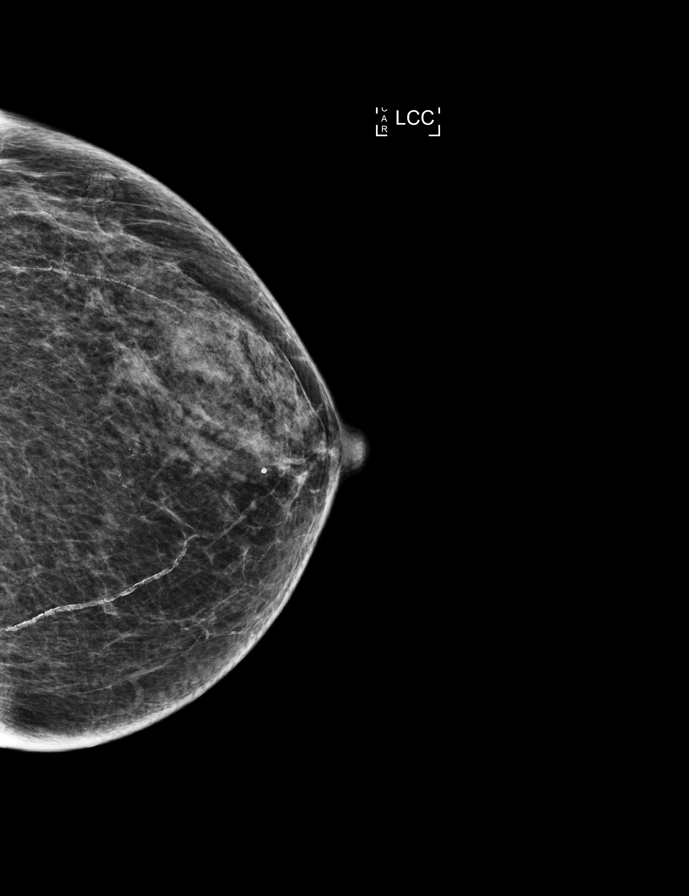

[R MLO (2 of 2)]
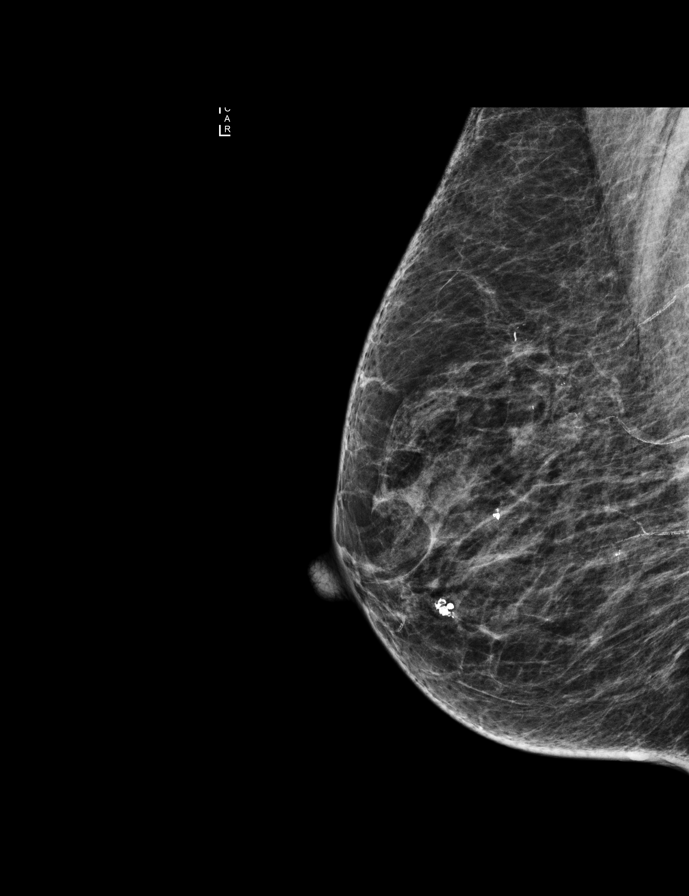

[L CC tomo · tomo slice 23/46.0]
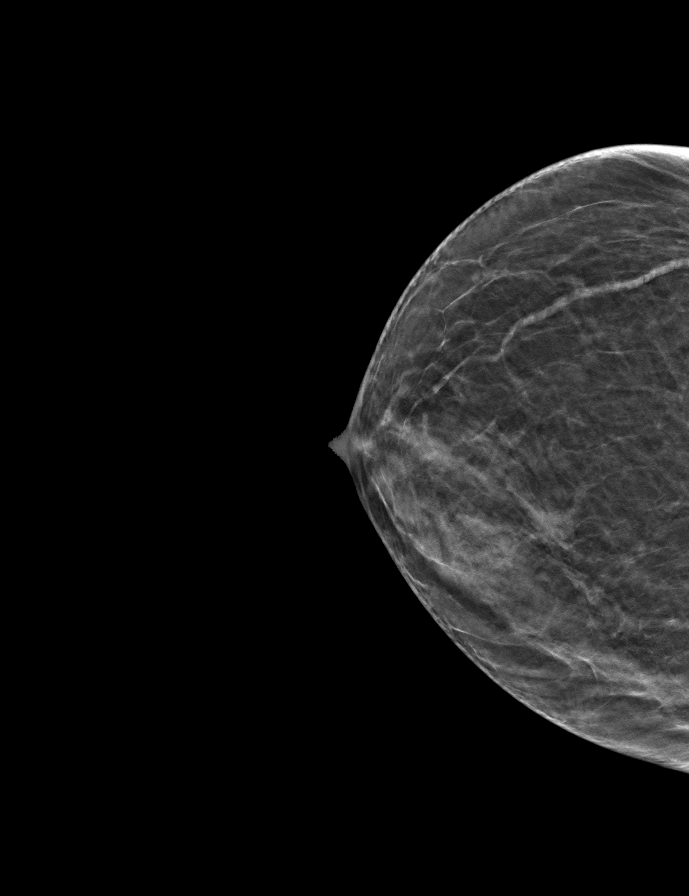

[R MLO tomo · tomo slice 21/42.0]
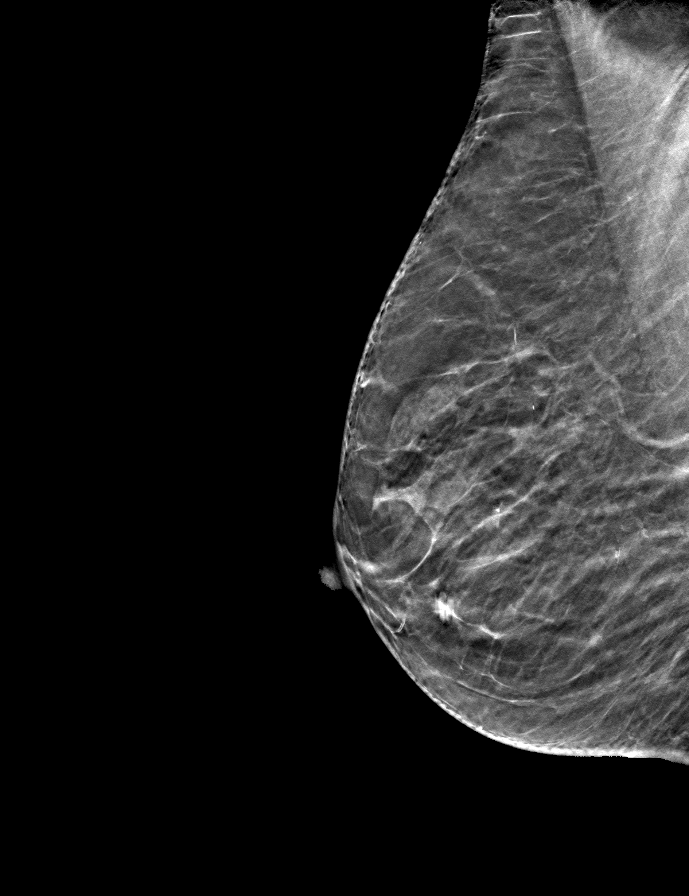

[R CC tomo · tomo slice 23/44.0]
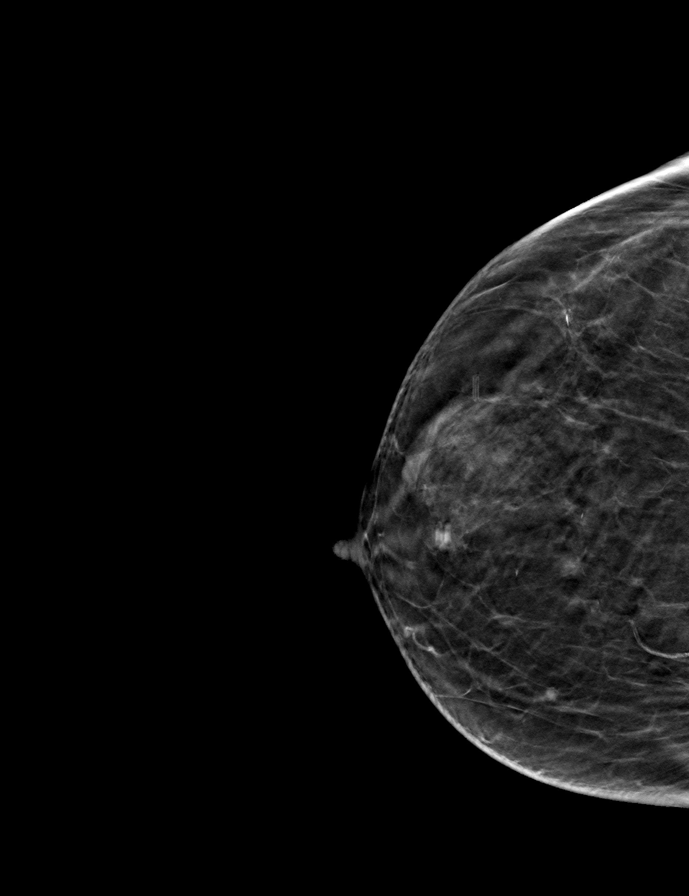

[L MLO tomo · tomo slice 23/44.0]
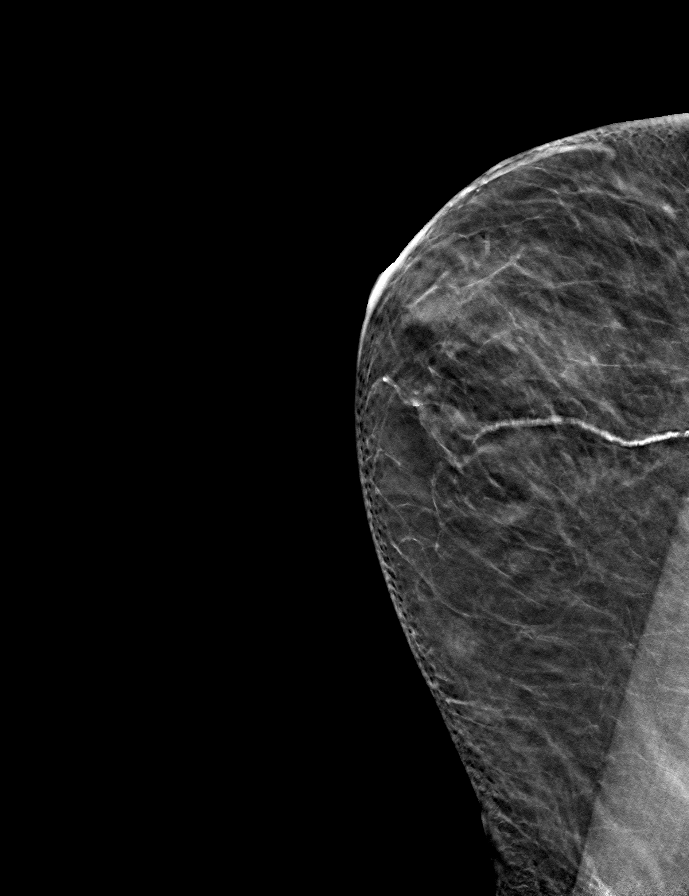

[9 of 25 positions shown; findings below may reference images not displayed]

ACR Breast Density Category c: The breast tissue is heterogeneously
dense, which may obscure small masses.
FINDINGS: There are no findings suspicious for malignancy. Images were
processed with CAD.
IMPRESSION: No mammographic evidence of malignancy. A result letter of this
screening mammogram will be mailed directly to the patient.

RECOMMENDATION:
Screening mammogram in one year. (Code:OA-G-1SS)

BI-RADS CATEGORY  1: Negative.

## 2017-01-21 DIAGNOSIS — M859 Disorder of bone density and structure, unspecified: Secondary | ICD-10-CM | POA: Diagnosis not present

## 2017-01-21 DIAGNOSIS — I10 Essential (primary) hypertension: Secondary | ICD-10-CM | POA: Diagnosis not present

## 2017-01-21 DIAGNOSIS — Z6824 Body mass index (BMI) 24.0-24.9, adult: Secondary | ICD-10-CM | POA: Diagnosis not present

## 2017-01-21 DIAGNOSIS — E784 Other hyperlipidemia: Secondary | ICD-10-CM | POA: Diagnosis not present

## 2017-01-21 DIAGNOSIS — K5900 Constipation, unspecified: Secondary | ICD-10-CM | POA: Diagnosis not present

## 2017-01-21 DIAGNOSIS — Z1389 Encounter for screening for other disorder: Secondary | ICD-10-CM | POA: Diagnosis not present

## 2017-01-21 DIAGNOSIS — Z0000 Encounter for general adult medical examination without abnormal findings: Secondary | ICD-10-CM | POA: Diagnosis not present

## 2017-01-21 DIAGNOSIS — M1990 Unspecified osteoarthritis, unspecified site: Secondary | ICD-10-CM | POA: Diagnosis not present

## 2017-01-21 DIAGNOSIS — D6489 Other specified anemias: Secondary | ICD-10-CM | POA: Diagnosis not present

## 2017-01-21 DIAGNOSIS — E042 Nontoxic multinodular goiter: Secondary | ICD-10-CM | POA: Diagnosis not present

## 2017-01-21 DIAGNOSIS — R569 Unspecified convulsions: Secondary | ICD-10-CM | POA: Diagnosis not present

## 2017-01-21 DIAGNOSIS — G629 Polyneuropathy, unspecified: Secondary | ICD-10-CM | POA: Diagnosis not present

## 2017-01-22 DIAGNOSIS — Z01419 Encounter for gynecological examination (general) (routine) without abnormal findings: Secondary | ICD-10-CM | POA: Diagnosis not present

## 2017-01-25 DIAGNOSIS — Z1212 Encounter for screening for malignant neoplasm of rectum: Secondary | ICD-10-CM | POA: Diagnosis not present

## 2017-03-08 DIAGNOSIS — D2271 Melanocytic nevi of right lower limb, including hip: Secondary | ICD-10-CM | POA: Diagnosis not present

## 2017-03-08 DIAGNOSIS — Z85828 Personal history of other malignant neoplasm of skin: Secondary | ICD-10-CM | POA: Diagnosis not present

## 2017-03-08 DIAGNOSIS — D225 Melanocytic nevi of trunk: Secondary | ICD-10-CM | POA: Diagnosis not present

## 2017-03-08 DIAGNOSIS — L821 Other seborrheic keratosis: Secondary | ICD-10-CM | POA: Diagnosis not present

## 2017-03-18 DIAGNOSIS — H61303 Acquired stenosis of external ear canal, unspecified, bilateral: Secondary | ICD-10-CM | POA: Diagnosis not present

## 2017-03-18 DIAGNOSIS — H6123 Impacted cerumen, bilateral: Secondary | ICD-10-CM | POA: Diagnosis not present

## 2017-03-18 DIAGNOSIS — H903 Sensorineural hearing loss, bilateral: Secondary | ICD-10-CM | POA: Diagnosis not present

## 2017-04-05 ENCOUNTER — Other Ambulatory Visit: Payer: Self-pay | Admitting: *Deleted

## 2017-04-05 DIAGNOSIS — I6523 Occlusion and stenosis of bilateral carotid arteries: Secondary | ICD-10-CM

## 2017-04-06 DIAGNOSIS — Z23 Encounter for immunization: Secondary | ICD-10-CM | POA: Diagnosis not present

## 2017-04-22 DIAGNOSIS — R58 Hemorrhage, not elsewhere classified: Secondary | ICD-10-CM | POA: Diagnosis not present

## 2017-04-22 DIAGNOSIS — W19XXXA Unspecified fall, initial encounter: Secondary | ICD-10-CM | POA: Diagnosis not present

## 2017-04-22 DIAGNOSIS — I10 Essential (primary) hypertension: Secondary | ICD-10-CM | POA: Diagnosis not present

## 2017-04-22 DIAGNOSIS — Z6824 Body mass index (BMI) 24.0-24.9, adult: Secondary | ICD-10-CM | POA: Diagnosis not present

## 2017-07-01 DIAGNOSIS — H52203 Unspecified astigmatism, bilateral: Secondary | ICD-10-CM | POA: Diagnosis not present

## 2017-07-01 DIAGNOSIS — H26493 Other secondary cataract, bilateral: Secondary | ICD-10-CM | POA: Diagnosis not present

## 2017-07-08 NOTE — Progress Notes (Deleted)
[name] Date of Birth: 16-May-1936   History of Present Illness: [name] is seen for yearly followup. She has a history of mildly abnormal myoview study in 2010. She has HTN. She has been treated conservatively and has done well.  She denies any chest pain or shortness of breath.  She was switched from Bystolic to Coreg due to cost. She notes her husband's dementia is trying. Some days he is very lucid and at other times quite "tangled". She does have chronic carotid bruits. Cannot remember when last dopplers were done.  Current Outpatient Medications on File Prior to Visit  Medication Sig Dispense Refill  . Calcium Carbonate-Vitamin D (CALTRATE 600+D PO) Take by mouth daily.     . carvedilol (COREG) 25 MG tablet Take 1 tablet (25 mg total) by mouth 2 (two) times daily. 180 tablet 3  . Ibuprofen (ADVIL PO) Take by mouth as needed.      . mometasone (ELOCON) 0.1 % cream Apply 1 application topically as needed.      . Multiple Vitamin (MULTIVITAMIN) tablet Take 1 tablet by mouth daily.      Marland Kitchen PHENobarbital (LUMINAL) 32.4 MG tablet Take 64.8 mg by mouth daily.     . phenytoin (DILANTIN) 100 MG ER capsule Take 200 mg by mouth daily.     Marland Kitchen pyridOXINE (VITAMIN B-6) 50 MG tablet Take 50 mg by mouth daily.    . simvastatin (ZOCOR) 20 MG tablet Take 20 mg by mouth at bedtime.      . vitamin B-12 (CYANOCOBALAMIN) 500 MCG tablet Take 500 mcg by mouth daily.     No current facility-administered medications on file prior to visit.     Allergies  Allergen Reactions  . Naproxen Sodium     REACTION: rash  . Penicillins     REACTION: hives  . Pravastatin   . Sulfa Drugs Cross Reactors     Past Medical History:  Diagnosis Date  . Abnormal cardiovascular stress test   . Carotid arterial disease (HCC)    MODERATE RIGHT  . Cerebral AV malformation   . DDD (degenerative disc disease)    SEVERE WITH SPINAL STENOSIS  . Hypercholesterolemia   . Hypertension   . MVP (mitral valve  prolapse)    MILD  . Numbness    LEFT FOOT AND ANKLE  . Osteopenia   . Pseudo-gout     Past Surgical History:  Procedure Laterality Date  . APPENDECTOMY    . BREAST MASS EXCISION    . CARPAL TUNNEL RELEASE    . REMOVAL OF PARATHYROID GLAND    . STRESS NUCLEAR STUDY     ABNORMAL  . TONSILLECTOMY      Social History   Tobacco Use  Smoking Status Former Smoker  . Packs/day: 1.00  . Years: 14.00  . Pack years: 14.00  . Types: Cigarettes  . Last attempt to quit: 11/07/1968  . Years since quitting: 48.6  Smokeless Tobacco Never Used    Social History   Substance and Sexual Activity  Alcohol Use No    Family History  Problem Relation Age of Onset  . Stroke Mother   . Lung cancer Father   . Breast cancer Neg Hx     Review of Systems: As the history of present illness.  All other systems were reviewed and are negative.  Physical Exam: There were no vitals taken for this visit. She is a pleasant elderly female in no acute distress. HEENT exam is  unremarkable. She has no JVD. She has bilateral carotid bruits. Lungs are clear. Cardiac exam reveals a grade 1/6 systolic murmur at the left sternal border. Abdomen is soft and nontender. She has no edema. She has excellent pedal pulses.  LABORATORY DATA: Labs dated 12/23/15: Cholesterol 204, triglycerides 43, HDL 85, LDL 110. CMET and TSH normal. Hgb 10.7. Dated 01/24/17: cholesterol 198, triglycerides 42, HDL 86, LDL 104. Hgb 10.1. Chemistries and TSH normal.   Carotid dopplers 08/08/16: stable 40-59% bilateral carotid disease.  Ecg today shows NSR with normal Ecg. LAD. I have personally reviewed and interpreted this study.   Assessment / Plan: 1. Hypertension-blood pressure is improved. Continue Coreg  25 mg bid. Monitor at home.  2.  Hyperlipidemia.  3. Mildly abnormal stress test in 2010. Unclear if this is artifact. Patient has remained asymptomatic. We'll continue risk factor modification.  4. Carotid bruits.  Patient is going to check with Dr. Reynaldo Minium to see when last study done. If she is due we can schedule here.  Follow up in one year.

## 2017-07-16 ENCOUNTER — Other Ambulatory Visit: Payer: Self-pay | Admitting: Cardiology

## 2017-07-16 ENCOUNTER — Ambulatory Visit: Payer: Medicare Other | Admitting: Cardiology

## 2017-07-16 DIAGNOSIS — I10 Essential (primary) hypertension: Secondary | ICD-10-CM

## 2017-07-24 DIAGNOSIS — Z6824 Body mass index (BMI) 24.0-24.9, adult: Secondary | ICD-10-CM | POA: Diagnosis not present

## 2017-07-24 DIAGNOSIS — R569 Unspecified convulsions: Secondary | ICD-10-CM | POA: Diagnosis not present

## 2017-07-24 DIAGNOSIS — Z8639 Personal history of other endocrine, nutritional and metabolic disease: Secondary | ICD-10-CM | POA: Diagnosis not present

## 2017-07-24 DIAGNOSIS — M859 Disorder of bone density and structure, unspecified: Secondary | ICD-10-CM | POA: Diagnosis not present

## 2017-07-24 DIAGNOSIS — Z1389 Encounter for screening for other disorder: Secondary | ICD-10-CM | POA: Diagnosis not present

## 2017-07-24 DIAGNOSIS — E042 Nontoxic multinodular goiter: Secondary | ICD-10-CM | POA: Diagnosis not present

## 2017-07-24 DIAGNOSIS — G629 Polyneuropathy, unspecified: Secondary | ICD-10-CM | POA: Diagnosis not present

## 2017-07-24 DIAGNOSIS — I10 Essential (primary) hypertension: Secondary | ICD-10-CM | POA: Diagnosis not present

## 2017-07-24 DIAGNOSIS — M1990 Unspecified osteoarthritis, unspecified site: Secondary | ICD-10-CM | POA: Diagnosis not present

## 2017-07-24 DIAGNOSIS — E7849 Other hyperlipidemia: Secondary | ICD-10-CM | POA: Diagnosis not present

## 2017-07-24 DIAGNOSIS — Q2730 Arteriovenous malformation, site unspecified: Secondary | ICD-10-CM | POA: Diagnosis not present

## 2017-07-24 DIAGNOSIS — K5900 Constipation, unspecified: Secondary | ICD-10-CM | POA: Diagnosis not present

## 2017-08-08 DIAGNOSIS — H26492 Other secondary cataract, left eye: Secondary | ICD-10-CM | POA: Diagnosis not present

## 2017-08-22 DIAGNOSIS — H26491 Other secondary cataract, right eye: Secondary | ICD-10-CM | POA: Diagnosis not present

## 2017-09-26 NOTE — Progress Notes (Signed)
[name] Date of Birth: [birth year]   History of Present Illness: [name] is seen for yearly followup. She has a history of mildly abnormal myoview study in 2010. She has HTN. She has been treated conservatively and has done well.    She is now living at [facility]. She denies any chest pain or shortness of breath.  She was switched from Bystolic to Coreg due to cost. She is wrapped up taking care of her husband who has dementia. Notes weakness in her legs. Really hasn't found the time to exercise.   Current Outpatient Medications on File Prior to Visit  Medication Sig Dispense Refill  . Calcium Carbonate-Vitamin D (CALTRATE 600+D PO) Take by mouth daily.     . carvedilol (COREG) 25 MG tablet TAKE 1 TABLET BY MOUTH TWICE DAILY. 180 tablet 0  . Ibuprofen (ADVIL PO) Take by mouth as needed.      . Multiple Vitamin (MULTIVITAMIN) tablet Take 1 tablet by mouth daily.      Marland Kitchen PHENobarbital (LUMINAL) 32.4 MG tablet Take 64.8 mg by mouth daily.     . phenytoin (DILANTIN) 100 MG ER capsule Take 200 mg by mouth daily.     . simvastatin (ZOCOR) 20 MG tablet Take 20 mg by mouth at bedtime.       No current facility-administered medications on file prior to visit.     Allergies  Allergen Reactions  . Naproxen Sodium     REACTION: rash  . Penicillins     REACTION: hives  . Pravastatin   . Sulfa Drugs Cross Reactors     Past Medical History:  Diagnosis Date  . Abnormal cardiovascular stress test   . Carotid arterial disease (HCC)    MODERATE RIGHT  . Cerebral AV malformation   . DDD (degenerative disc disease)    SEVERE WITH SPINAL STENOSIS  . Hypercholesterolemia   . Hypertension   . MVP (mitral valve prolapse)    MILD  . Numbness    LEFT FOOT AND ANKLE  . Osteopenia   . Pseudo-gout     Past Surgical History:  Procedure Laterality Date  . APPENDECTOMY    . BREAST MASS EXCISION    . CARPAL TUNNEL RELEASE    . REMOVAL OF PARATHYROID GLAND    . STRESS NUCLEAR  STUDY     ABNORMAL  . TONSILLECTOMY      Social History   Tobacco Use  Smoking Status Former Smoker  . Packs/day: 1.00  . Years: 14.00  . Pack years: 14.00  . Types: Cigarettes  . Last attempt to quit: 11/07/1968  . Years since quitting: 48.9  Smokeless Tobacco Never Used    Social History   Substance and Sexual Activity  Alcohol Use No    Family History  Problem Relation Age of Onset  . Stroke Mother   . Lung cancer Father   . Breast cancer Neg Hx     Review of Systems: As the history of present illness.  All other systems were reviewed and are negative.  Physical Exam: BP (!) 138/58   Pulse 72   Ht 5\' 6"  (1.676 m)   Wt 150 lb 12.8 oz (68.4 kg)   BMI 24.34 kg/m  GENERAL:  Well appearing HEENT:  PERRL, EOMI, sclera are clear. Oropharynx is clear. NECK:  No jugular venous distention, carotid upstroke brisk and symmetric, no bruits, no thyromegaly or adenopathy LUNGS:  Clear to auscultation bilaterally CHEST:  Unremarkable HEART:  RRR,  PMI  not displaced or sustained,S1 and S2 within normal limits, no S3, no S4: no clicks, no rubs, no murmurs ABD:  Soft, nontender. BS +, no masses or bruits. No hepatomegaly, no splenomegaly EXT:  2 + pulses throughout, no edema, no cyanosis no clubbing SKIN:  Warm and dry.  No rashes NEURO:  Alert and oriented x 3. Cranial nerves II through XII intact. PSYCH:  Cognitively intact    LABORATORY DATA: Labs dated 12/23/15: Cholesterol 204, triglycerides 43, HDL 85, LDL 110. CMET and TSH normal. Hgb 10.7. Dated 01/14/17: cholesterol 198, triglycerides 42, HDL 86, LDL 104. Hgb 10.1. Chemistries and TSH normal.  Ecg today shows NSR with rate 72. Incomplete RBBB.  I have personally reviewed and interpreted this study.  Carotid dopplers Feb. 2018: 40-59% bilateral stenosis.    Assessment / Plan: 1. Hypertension-blood pressure is controlled. Continue Coreg  25 mg bid. Monitor at home.  2.  Hyperlipidemia. Under fair control.  3.  Mildly abnormal stress test in 2010. Unclear if this is artifact. Patient has remained asymptomatic. We'll continue risk factor modification.  4. Carotid bruits. Moderate carotid disease- will repeat.  Follow up in one year.

## 2017-09-27 ENCOUNTER — Ambulatory Visit (INDEPENDENT_AMBULATORY_CARE_PROVIDER_SITE_OTHER): Payer: Medicare Other | Admitting: Cardiology

## 2017-09-27 ENCOUNTER — Encounter: Payer: Self-pay | Admitting: Cardiology

## 2017-09-27 VITALS — BP 138/58 | HR 72 | Ht 66.0 in | Wt 150.8 lb

## 2017-09-27 DIAGNOSIS — I10 Essential (primary) hypertension: Secondary | ICD-10-CM | POA: Diagnosis not present

## 2017-09-27 DIAGNOSIS — E7800 Pure hypercholesterolemia, unspecified: Secondary | ICD-10-CM | POA: Diagnosis not present

## 2017-09-27 DIAGNOSIS — I6523 Occlusion and stenosis of bilateral carotid arteries: Secondary | ICD-10-CM | POA: Diagnosis not present

## 2017-09-27 NOTE — Patient Instructions (Addendum)
Continue your current therapy  I encourage you to exercise more  Schedule Carotid dopplers  I will see you in one year

## 2017-09-27 NOTE — Addendum Note (Signed)
Addended by: Kathyrn Lass on: 09/27/2017 11:48 AM   Modules accepted: Orders

## 2017-10-08 ENCOUNTER — Encounter (HOSPITAL_COMMUNITY): Payer: Medicare Other

## 2017-10-14 ENCOUNTER — Encounter (HOSPITAL_COMMUNITY): Payer: Medicare Other

## 2017-10-16 ENCOUNTER — Encounter (HOSPITAL_COMMUNITY): Payer: Self-pay | Admitting: Cardiology

## 2017-10-19 ENCOUNTER — Other Ambulatory Visit: Payer: Self-pay | Admitting: Cardiology

## 2017-10-19 DIAGNOSIS — I10 Essential (primary) hypertension: Secondary | ICD-10-CM

## 2017-10-21 NOTE — Telephone Encounter (Signed)
Rx has been sent to the pharmacy electronically. ° °

## 2018-01-10 DIAGNOSIS — M25571 Pain in right ankle and joints of right foot: Secondary | ICD-10-CM | POA: Diagnosis not present

## 2018-01-10 DIAGNOSIS — Z9181 History of falling: Secondary | ICD-10-CM | POA: Diagnosis not present

## 2018-01-15 DIAGNOSIS — M859 Disorder of bone density and structure, unspecified: Secondary | ICD-10-CM | POA: Diagnosis not present

## 2018-01-15 DIAGNOSIS — R569 Unspecified convulsions: Secondary | ICD-10-CM | POA: Diagnosis not present

## 2018-01-15 DIAGNOSIS — E7849 Other hyperlipidemia: Secondary | ICD-10-CM | POA: Diagnosis not present

## 2018-01-15 DIAGNOSIS — E042 Nontoxic multinodular goiter: Secondary | ICD-10-CM | POA: Diagnosis not present

## 2018-01-15 DIAGNOSIS — I10 Essential (primary) hypertension: Secondary | ICD-10-CM | POA: Diagnosis not present

## 2018-01-15 DIAGNOSIS — R82998 Other abnormal findings in urine: Secondary | ICD-10-CM | POA: Diagnosis not present

## 2018-01-22 DIAGNOSIS — Z0000 Encounter for general adult medical examination without abnormal findings: Secondary | ICD-10-CM | POA: Diagnosis not present

## 2018-01-22 DIAGNOSIS — Q2730 Arteriovenous malformation, site unspecified: Secondary | ICD-10-CM | POA: Diagnosis not present

## 2018-01-22 DIAGNOSIS — H6123 Impacted cerumen, bilateral: Secondary | ICD-10-CM | POA: Diagnosis not present

## 2018-01-22 DIAGNOSIS — E7849 Other hyperlipidemia: Secondary | ICD-10-CM | POA: Diagnosis not present

## 2018-01-22 DIAGNOSIS — I10 Essential (primary) hypertension: Secondary | ICD-10-CM | POA: Diagnosis not present

## 2018-01-22 DIAGNOSIS — M859 Disorder of bone density and structure, unspecified: Secondary | ICD-10-CM | POA: Diagnosis not present

## 2018-01-22 DIAGNOSIS — M1990 Unspecified osteoarthritis, unspecified site: Secondary | ICD-10-CM | POA: Diagnosis not present

## 2018-01-22 DIAGNOSIS — E042 Nontoxic multinodular goiter: Secondary | ICD-10-CM | POA: Diagnosis not present

## 2018-01-22 DIAGNOSIS — R569 Unspecified convulsions: Secondary | ICD-10-CM | POA: Diagnosis not present

## 2018-01-22 DIAGNOSIS — Z6823 Body mass index (BMI) 23.0-23.9, adult: Secondary | ICD-10-CM | POA: Diagnosis not present

## 2018-01-22 DIAGNOSIS — G629 Polyneuropathy, unspecified: Secondary | ICD-10-CM | POA: Diagnosis not present

## 2018-01-24 ENCOUNTER — Other Ambulatory Visit: Payer: Self-pay | Admitting: Cardiology

## 2018-01-24 DIAGNOSIS — I6523 Occlusion and stenosis of bilateral carotid arteries: Secondary | ICD-10-CM

## 2018-01-24 DIAGNOSIS — Z1212 Encounter for screening for malignant neoplasm of rectum: Secondary | ICD-10-CM | POA: Diagnosis not present

## 2018-01-30 DIAGNOSIS — Z124 Encounter for screening for malignant neoplasm of cervix: Secondary | ICD-10-CM | POA: Diagnosis not present

## 2018-01-30 DIAGNOSIS — Z01419 Encounter for gynecological examination (general) (routine) without abnormal findings: Secondary | ICD-10-CM | POA: Diagnosis not present

## 2018-01-30 DIAGNOSIS — Z1231 Encounter for screening mammogram for malignant neoplasm of breast: Secondary | ICD-10-CM | POA: Diagnosis not present

## 2018-01-30 DIAGNOSIS — Z6823 Body mass index (BMI) 23.0-23.9, adult: Secondary | ICD-10-CM | POA: Diagnosis not present

## 2018-02-04 ENCOUNTER — Ambulatory Visit (HOSPITAL_COMMUNITY)
Admission: RE | Admit: 2018-02-04 | Discharge: 2018-02-04 | Disposition: A | Discharge status: Home / Self Care | Payer: Medicare Other | Source: Ambulatory Visit | Attending: Cardiology | Admitting: Cardiology

## 2018-02-04 DIAGNOSIS — I6523 Occlusion and stenosis of bilateral carotid arteries: Secondary | ICD-10-CM | POA: Insufficient documentation

## 2018-03-08 DIAGNOSIS — Z23 Encounter for immunization: Secondary | ICD-10-CM | POA: Diagnosis not present

## 2018-03-11 DIAGNOSIS — H01005 Unspecified blepharitis left lower eyelid: Secondary | ICD-10-CM | POA: Diagnosis not present

## 2018-03-11 DIAGNOSIS — H02102 Unspecified ectropion of right lower eyelid: Secondary | ICD-10-CM | POA: Diagnosis not present

## 2018-03-11 DIAGNOSIS — H01002 Unspecified blepharitis right lower eyelid: Secondary | ICD-10-CM | POA: Diagnosis not present

## 2018-03-11 DIAGNOSIS — H04222 Epiphora due to insufficient drainage, left lacrimal gland: Secondary | ICD-10-CM | POA: Diagnosis not present

## 2018-03-18 DIAGNOSIS — R2689 Other abnormalities of gait and mobility: Secondary | ICD-10-CM | POA: Diagnosis not present

## 2018-03-18 DIAGNOSIS — R278 Other lack of coordination: Secondary | ICD-10-CM | POA: Diagnosis not present

## 2018-03-18 DIAGNOSIS — M48061 Spinal stenosis, lumbar region without neurogenic claudication: Secondary | ICD-10-CM | POA: Diagnosis not present

## 2018-03-18 DIAGNOSIS — Z9181 History of falling: Secondary | ICD-10-CM | POA: Diagnosis not present

## 2018-03-18 DIAGNOSIS — G63 Polyneuropathy in diseases classified elsewhere: Secondary | ICD-10-CM | POA: Diagnosis not present

## 2018-03-19 DIAGNOSIS — G63 Polyneuropathy in diseases classified elsewhere: Secondary | ICD-10-CM | POA: Diagnosis not present

## 2018-03-19 DIAGNOSIS — M48061 Spinal stenosis, lumbar region without neurogenic claudication: Secondary | ICD-10-CM | POA: Diagnosis not present

## 2018-03-19 DIAGNOSIS — Z9181 History of falling: Secondary | ICD-10-CM | POA: Diagnosis not present

## 2018-03-19 DIAGNOSIS — R2689 Other abnormalities of gait and mobility: Secondary | ICD-10-CM | POA: Diagnosis not present

## 2018-03-19 DIAGNOSIS — R278 Other lack of coordination: Secondary | ICD-10-CM | POA: Diagnosis not present

## 2018-03-24 DIAGNOSIS — M48061 Spinal stenosis, lumbar region without neurogenic claudication: Secondary | ICD-10-CM | POA: Diagnosis not present

## 2018-03-24 DIAGNOSIS — G63 Polyneuropathy in diseases classified elsewhere: Secondary | ICD-10-CM | POA: Diagnosis not present

## 2018-03-24 DIAGNOSIS — Z9181 History of falling: Secondary | ICD-10-CM | POA: Diagnosis not present

## 2018-03-24 DIAGNOSIS — R2689 Other abnormalities of gait and mobility: Secondary | ICD-10-CM | POA: Diagnosis not present

## 2018-03-24 DIAGNOSIS — R278 Other lack of coordination: Secondary | ICD-10-CM | POA: Diagnosis not present

## 2018-03-28 DIAGNOSIS — R2689 Other abnormalities of gait and mobility: Secondary | ICD-10-CM | POA: Diagnosis not present

## 2018-03-28 DIAGNOSIS — M48061 Spinal stenosis, lumbar region without neurogenic claudication: Secondary | ICD-10-CM | POA: Diagnosis not present

## 2018-03-28 DIAGNOSIS — G63 Polyneuropathy in diseases classified elsewhere: Secondary | ICD-10-CM | POA: Diagnosis not present

## 2018-03-28 DIAGNOSIS — Z9181 History of falling: Secondary | ICD-10-CM | POA: Diagnosis not present

## 2018-03-28 DIAGNOSIS — R278 Other lack of coordination: Secondary | ICD-10-CM | POA: Diagnosis not present

## 2018-04-01 DIAGNOSIS — R278 Other lack of coordination: Secondary | ICD-10-CM | POA: Diagnosis not present

## 2018-04-01 DIAGNOSIS — G63 Polyneuropathy in diseases classified elsewhere: Secondary | ICD-10-CM | POA: Diagnosis not present

## 2018-04-01 DIAGNOSIS — M48061 Spinal stenosis, lumbar region without neurogenic claudication: Secondary | ICD-10-CM | POA: Diagnosis not present

## 2018-04-01 DIAGNOSIS — R2689 Other abnormalities of gait and mobility: Secondary | ICD-10-CM | POA: Diagnosis not present

## 2018-04-01 DIAGNOSIS — Z9181 History of falling: Secondary | ICD-10-CM | POA: Diagnosis not present

## 2018-04-04 DIAGNOSIS — R278 Other lack of coordination: Secondary | ICD-10-CM | POA: Diagnosis not present

## 2018-04-04 DIAGNOSIS — R2689 Other abnormalities of gait and mobility: Secondary | ICD-10-CM | POA: Diagnosis not present

## 2018-04-04 DIAGNOSIS — Z9181 History of falling: Secondary | ICD-10-CM | POA: Diagnosis not present

## 2018-04-04 DIAGNOSIS — M48061 Spinal stenosis, lumbar region without neurogenic claudication: Secondary | ICD-10-CM | POA: Diagnosis not present

## 2018-04-04 DIAGNOSIS — G63 Polyneuropathy in diseases classified elsewhere: Secondary | ICD-10-CM | POA: Diagnosis not present

## 2018-04-08 DIAGNOSIS — D225 Melanocytic nevi of trunk: Secondary | ICD-10-CM | POA: Diagnosis not present

## 2018-04-08 DIAGNOSIS — L659 Nonscarring hair loss, unspecified: Secondary | ICD-10-CM | POA: Diagnosis not present

## 2018-04-08 DIAGNOSIS — L821 Other seborrheic keratosis: Secondary | ICD-10-CM | POA: Diagnosis not present

## 2018-04-08 DIAGNOSIS — D1801 Hemangioma of skin and subcutaneous tissue: Secondary | ICD-10-CM | POA: Diagnosis not present

## 2018-04-08 DIAGNOSIS — H61002 Unspecified perichondritis of left external ear: Secondary | ICD-10-CM | POA: Diagnosis not present

## 2018-04-08 DIAGNOSIS — D2271 Melanocytic nevi of right lower limb, including hip: Secondary | ICD-10-CM | POA: Diagnosis not present

## 2018-04-08 DIAGNOSIS — Z85828 Personal history of other malignant neoplasm of skin: Secondary | ICD-10-CM | POA: Diagnosis not present

## 2018-04-09 DIAGNOSIS — R2689 Other abnormalities of gait and mobility: Secondary | ICD-10-CM | POA: Diagnosis not present

## 2018-04-09 DIAGNOSIS — M48061 Spinal stenosis, lumbar region without neurogenic claudication: Secondary | ICD-10-CM | POA: Diagnosis not present

## 2018-04-09 DIAGNOSIS — G63 Polyneuropathy in diseases classified elsewhere: Secondary | ICD-10-CM | POA: Diagnosis not present

## 2018-04-09 DIAGNOSIS — Z9181 History of falling: Secondary | ICD-10-CM | POA: Diagnosis not present

## 2018-04-09 DIAGNOSIS — R278 Other lack of coordination: Secondary | ICD-10-CM | POA: Diagnosis not present

## 2018-04-11 DIAGNOSIS — Z9181 History of falling: Secondary | ICD-10-CM | POA: Diagnosis not present

## 2018-04-11 DIAGNOSIS — R2689 Other abnormalities of gait and mobility: Secondary | ICD-10-CM | POA: Diagnosis not present

## 2018-04-11 DIAGNOSIS — R278 Other lack of coordination: Secondary | ICD-10-CM | POA: Diagnosis not present

## 2018-04-11 DIAGNOSIS — M48061 Spinal stenosis, lumbar region without neurogenic claudication: Secondary | ICD-10-CM | POA: Diagnosis not present

## 2018-04-11 DIAGNOSIS — G63 Polyneuropathy in diseases classified elsewhere: Secondary | ICD-10-CM | POA: Diagnosis not present

## 2018-04-14 DIAGNOSIS — G63 Polyneuropathy in diseases classified elsewhere: Secondary | ICD-10-CM | POA: Diagnosis not present

## 2018-04-14 DIAGNOSIS — R2689 Other abnormalities of gait and mobility: Secondary | ICD-10-CM | POA: Diagnosis not present

## 2018-04-14 DIAGNOSIS — R278 Other lack of coordination: Secondary | ICD-10-CM | POA: Diagnosis not present

## 2018-04-14 DIAGNOSIS — M48061 Spinal stenosis, lumbar region without neurogenic claudication: Secondary | ICD-10-CM | POA: Diagnosis not present

## 2018-04-14 DIAGNOSIS — Z9181 History of falling: Secondary | ICD-10-CM | POA: Diagnosis not present

## 2018-04-15 DIAGNOSIS — H6123 Impacted cerumen, bilateral: Secondary | ICD-10-CM | POA: Diagnosis not present

## 2018-04-15 DIAGNOSIS — H61303 Acquired stenosis of external ear canal, unspecified, bilateral: Secondary | ICD-10-CM | POA: Diagnosis not present

## 2018-04-15 DIAGNOSIS — H608X3 Other otitis externa, bilateral: Secondary | ICD-10-CM | POA: Diagnosis not present

## 2018-04-15 DIAGNOSIS — H903 Sensorineural hearing loss, bilateral: Secondary | ICD-10-CM | POA: Diagnosis not present

## 2018-04-18 DIAGNOSIS — R2689 Other abnormalities of gait and mobility: Secondary | ICD-10-CM | POA: Diagnosis not present

## 2018-04-18 DIAGNOSIS — R278 Other lack of coordination: Secondary | ICD-10-CM | POA: Diagnosis not present

## 2018-04-18 DIAGNOSIS — M48061 Spinal stenosis, lumbar region without neurogenic claudication: Secondary | ICD-10-CM | POA: Diagnosis not present

## 2018-04-18 DIAGNOSIS — G63 Polyneuropathy in diseases classified elsewhere: Secondary | ICD-10-CM | POA: Diagnosis not present

## 2018-04-18 DIAGNOSIS — Z9181 History of falling: Secondary | ICD-10-CM | POA: Diagnosis not present

## 2018-04-23 DIAGNOSIS — R2689 Other abnormalities of gait and mobility: Secondary | ICD-10-CM | POA: Diagnosis not present

## 2018-04-23 DIAGNOSIS — G63 Polyneuropathy in diseases classified elsewhere: Secondary | ICD-10-CM | POA: Diagnosis not present

## 2018-04-23 DIAGNOSIS — Z9181 History of falling: Secondary | ICD-10-CM | POA: Diagnosis not present

## 2018-04-23 DIAGNOSIS — R278 Other lack of coordination: Secondary | ICD-10-CM | POA: Diagnosis not present

## 2018-04-23 DIAGNOSIS — M48061 Spinal stenosis, lumbar region without neurogenic claudication: Secondary | ICD-10-CM | POA: Diagnosis not present

## 2018-04-24 DIAGNOSIS — R2689 Other abnormalities of gait and mobility: Secondary | ICD-10-CM | POA: Diagnosis not present

## 2018-04-24 DIAGNOSIS — M48061 Spinal stenosis, lumbar region without neurogenic claudication: Secondary | ICD-10-CM | POA: Diagnosis not present

## 2018-04-24 DIAGNOSIS — G63 Polyneuropathy in diseases classified elsewhere: Secondary | ICD-10-CM | POA: Diagnosis not present

## 2018-04-24 DIAGNOSIS — Z9181 History of falling: Secondary | ICD-10-CM | POA: Diagnosis not present

## 2018-04-24 DIAGNOSIS — R278 Other lack of coordination: Secondary | ICD-10-CM | POA: Diagnosis not present

## 2018-08-04 DIAGNOSIS — M4800 Spinal stenosis, site unspecified: Secondary | ICD-10-CM | POA: Diagnosis not present

## 2018-08-04 DIAGNOSIS — M859 Disorder of bone density and structure, unspecified: Secondary | ICD-10-CM | POA: Diagnosis not present

## 2018-08-04 DIAGNOSIS — Z9181 History of falling: Secondary | ICD-10-CM | POA: Diagnosis not present

## 2018-08-04 DIAGNOSIS — M25571 Pain in right ankle and joints of right foot: Secondary | ICD-10-CM | POA: Diagnosis not present

## 2018-08-04 DIAGNOSIS — D6489 Other specified anemias: Secondary | ICD-10-CM | POA: Diagnosis not present

## 2018-08-04 DIAGNOSIS — K649 Unspecified hemorrhoids: Secondary | ICD-10-CM | POA: Diagnosis not present

## 2018-08-04 DIAGNOSIS — E7849 Other hyperlipidemia: Secondary | ICD-10-CM | POA: Diagnosis not present

## 2018-08-04 DIAGNOSIS — M1990 Unspecified osteoarthritis, unspecified site: Secondary | ICD-10-CM | POA: Diagnosis not present

## 2018-08-04 DIAGNOSIS — K5900 Constipation, unspecified: Secondary | ICD-10-CM | POA: Diagnosis not present

## 2018-08-04 DIAGNOSIS — E042 Nontoxic multinodular goiter: Secondary | ICD-10-CM | POA: Diagnosis not present

## 2018-08-04 DIAGNOSIS — R569 Unspecified convulsions: Secondary | ICD-10-CM | POA: Diagnosis not present

## 2018-08-04 DIAGNOSIS — I10 Essential (primary) hypertension: Secondary | ICD-10-CM | POA: Diagnosis not present

## 2018-08-08 ENCOUNTER — Other Ambulatory Visit: Payer: Self-pay | Admitting: Cardiology

## 2018-08-08 DIAGNOSIS — I10 Essential (primary) hypertension: Secondary | ICD-10-CM

## 2018-08-08 NOTE — Telephone Encounter (Signed)
Rx(s) sent to pharmacy electronically.  

## 2018-10-01 ENCOUNTER — Telehealth: Payer: Self-pay

## 2018-10-01 NOTE — Telephone Encounter (Signed)
Spoke with patient to reschedule office visit to virtual visit. Patient states she is doing well and will wait for clinic to open back up after this virus goes away.

## 2018-10-13 ENCOUNTER — Other Ambulatory Visit: Payer: Self-pay | Admitting: Cardiology

## 2018-10-13 DIAGNOSIS — I6523 Occlusion and stenosis of bilateral carotid arteries: Secondary | ICD-10-CM

## 2018-10-14 ENCOUNTER — Ambulatory Visit: Payer: Medicare Other | Admitting: Cardiology

## 2018-11-12 ENCOUNTER — Other Ambulatory Visit: Payer: Self-pay | Admitting: Cardiology

## 2018-11-12 DIAGNOSIS — I10 Essential (primary) hypertension: Secondary | ICD-10-CM

## 2018-11-25 DIAGNOSIS — H04552 Acquired stenosis of left nasolacrimal duct: Secondary | ICD-10-CM | POA: Diagnosis not present

## 2018-11-25 DIAGNOSIS — H52203 Unspecified astigmatism, bilateral: Secondary | ICD-10-CM | POA: Diagnosis not present

## 2018-11-25 DIAGNOSIS — Z961 Presence of intraocular lens: Secondary | ICD-10-CM | POA: Diagnosis not present

## 2018-12-11 ENCOUNTER — Other Ambulatory Visit: Payer: Self-pay | Admitting: Cardiology

## 2018-12-11 DIAGNOSIS — I10 Essential (primary) hypertension: Secondary | ICD-10-CM

## 2018-12-26 NOTE — Progress Notes (Signed)
[name] Date of Birth: March 24, 1936   History of Present Illness: [name] is seen for yearly followup. She has a history of mildly abnormal myoview study in 2010. She has HTN. She has been treated conservatively and has done well.    She is now living at [facility]. Her husband passed away last year. She denies any chest pain or shortness of breath.  She does have neuropathy in her legs. Walks with a cane or rollator. No TIA or CVA symptoms  Current Outpatient Medications on File Prior to Visit  Medication Sig Dispense Refill  . Calcium Carbonate-Vitamin D (CALTRATE 600+D PO) Take by mouth daily.     . carvedilol (COREG) 25 MG tablet TAKE 1 TABLET BY MOUTH TWICE DAILY. 90 tablet 1  . Cyanocobalamin (VITAMIN B 12) 500 MCG TABS Take by mouth.    . Ibuprofen (ADVIL PO) Take by mouth as needed.      . Multiple Vitamin (MULTIVITAMIN) tablet Take 1 tablet by mouth daily.      Marland Kitchen PHENobarbital (LUMINAL) 32.4 MG tablet Take 64.8 mg by mouth daily.     . phenytoin (DILANTIN) 100 MG ER capsule Take 200 mg by mouth daily.     . simvastatin (ZOCOR) 20 MG tablet Take 20 mg by mouth at bedtime.       No current facility-administered medications on file prior to visit.     Allergies  Allergen Reactions  . Naproxen Sodium     REACTION: rash  . Penicillins     REACTION: hives  . Pravastatin   . Sulfa Drugs Cross Reactors     Past Medical History:  Diagnosis Date  . Abnormal cardiovascular stress test   . Carotid arterial disease (HCC)    MODERATE RIGHT  . Cerebral AV malformation   . DDD (degenerative disc disease)    SEVERE WITH SPINAL STENOSIS  . Hypercholesterolemia   . Hypertension   . MVP (mitral valve prolapse)    MILD  . Numbness    LEFT FOOT AND ANKLE  . Osteopenia   . Pseudo-gout     Past Surgical History:  Procedure Laterality Date  . APPENDECTOMY    . BREAST MASS EXCISION    . CARPAL TUNNEL RELEASE    . REMOVAL OF PARATHYROID GLAND    . STRESS NUCLEAR  STUDY     ABNORMAL  . TONSILLECTOMY      Social History   Tobacco Use  Smoking Status Former Smoker  . Packs/day: 1.00  . Years: 14.00  . Pack years: 14.00  . Types: Cigarettes  . Quit date: 11/07/1968  . Years since quitting: 50.1  Smokeless Tobacco Never Used    Social History   Substance and Sexual Activity  Alcohol Use No    Family History  Problem Relation Age of Onset  . Stroke Mother   . Lung cancer Father   . Breast cancer Neg Hx     Review of Systems: As the history of present illness.  All other systems were reviewed and are negative.  Physical Exam: BP (!) 125/56   Pulse 72   Temp (!) 96.8 F (36 C)   Ht 5\' 6"  (1.676 m)   Wt 159 lb 6.4 oz (72.3 kg)   SpO2 95%   BMI 25.73 kg/m  GENERAL:  Well appearing elderly WF in NAD HEENT:  PERRL, EOMI, sclera are clear. Oropharynx is clear. NECK:  No jugular venous distention, carotid upstroke brisk and symmetric, no bruits, no  thyromegaly or adenopathy LUNGS:  Clear to auscultation bilaterally CHEST:  Unremarkable HEART:  RRR,  PMI not displaced or sustained,S1 and S2 within normal limits, no S3, no S4: no clicks, no rubs, no murmurs ABD:  Soft, nontender. BS +, no masses or bruits. No hepatomegaly, no splenomegaly EXT:  2 + pulses throughout, no edema, no cyanosis no clubbing SKIN:  Warm and dry.  No rashes NEURO:  Alert and oriented x 3. Cranial nerves II through XII intact. PSYCH:  Cognitively intact    LABORATORY DATA: Labs dated 12/23/15: Cholesterol 204, triglycerides 43, HDL 85, LDL 110. CMET and TSH normal. Hgb 10.7. Dated 01/14/17: cholesterol 198, triglycerides 42, HDL 86, LDL 104. Hgb 10.1. Chemistries and TSH normal. Dated 01/15/18: cholesterol 223, triglycerides 52, HDL 97, LDL 116. Hgb 10.1. CMET and TSH normal   Ecg today shows NSR with rate 71. LVH. Nonspecific TWA.  I have personally reviewed and interpreted this study.  Carotid dopplers Feb. 2019: 40-59% bilateral stenosis. Incidental  findings of thyroid nodules.    Assessment / Plan: 1. Hypertension-blood pressure is controlled. Continue current therapy  2.  Hyperlipidemia.   3. Mildly abnormal stress test in 2010. Unclear if this is artifact. Patient has remained asymptomatic. We'll continue risk factor modification.  4. Carotid bruits. Moderate carotid disease. She wants to wait to repeat scan due to the pandemic which I think is fine. She is asymptomatic.   5. Thyroid nodules. Felt to be benign  Follow up in one year.

## 2018-12-31 ENCOUNTER — Telehealth: Payer: Self-pay | Admitting: Cardiology

## 2018-12-31 NOTE — Telephone Encounter (Signed)
I called pt to remind her of appt, no answer and no voicemail.

## 2019-01-01 ENCOUNTER — Ambulatory Visit (INDEPENDENT_AMBULATORY_CARE_PROVIDER_SITE_OTHER): Payer: Medicare Other | Admitting: Cardiology

## 2019-01-01 ENCOUNTER — Encounter: Payer: Self-pay | Admitting: Cardiology

## 2019-01-01 ENCOUNTER — Other Ambulatory Visit: Payer: Self-pay

## 2019-01-01 VITALS — BP 125/56 | HR 72 | Temp 96.8°F | Ht 66.0 in | Wt 159.4 lb

## 2019-01-01 DIAGNOSIS — E7800 Pure hypercholesterolemia, unspecified: Secondary | ICD-10-CM | POA: Diagnosis not present

## 2019-01-01 DIAGNOSIS — I6523 Occlusion and stenosis of bilateral carotid arteries: Secondary | ICD-10-CM | POA: Diagnosis not present

## 2019-01-01 DIAGNOSIS — I10 Essential (primary) hypertension: Secondary | ICD-10-CM | POA: Diagnosis not present

## 2019-01-01 NOTE — Patient Instructions (Signed)
Medication Instructions:  Continue same medications If you need a refill on your cardiac medications before your next appointment, please call your pharmacy.   Lab work: None ordered   Testing/Procedures: Schedule Carotid dopplers in 1 year  Follow-Up: At [facility], you and your health needs are our priority.  As part of our continuing mission to provide you with exceptional heart care, we have created designated Provider Care Teams.  These Care Teams include your primary Cardiologist (physician) and Advanced Practice Providers (APPs -  Physician Assistants and Nurse Practitioners) who all work together to provide you with the care you need, when you need it. . Scheduled follow up appointment in 1 year     Call 3 months before to schedule

## 2019-01-01 NOTE — Addendum Note (Signed)
Addended by: Kathyrn Lass on: 01/01/2019 10:19 AM   Modules accepted: Orders

## 2019-02-13 DIAGNOSIS — M859 Disorder of bone density and structure, unspecified: Secondary | ICD-10-CM | POA: Diagnosis not present

## 2019-02-13 DIAGNOSIS — E042 Nontoxic multinodular goiter: Secondary | ICD-10-CM | POA: Diagnosis not present

## 2019-02-13 DIAGNOSIS — R569 Unspecified convulsions: Secondary | ICD-10-CM | POA: Diagnosis not present

## 2019-02-13 DIAGNOSIS — Z23 Encounter for immunization: Secondary | ICD-10-CM | POA: Diagnosis not present

## 2019-02-13 DIAGNOSIS — E7849 Other hyperlipidemia: Secondary | ICD-10-CM | POA: Diagnosis not present

## 2019-02-20 DIAGNOSIS — K5900 Constipation, unspecified: Secondary | ICD-10-CM | POA: Diagnosis not present

## 2019-02-20 DIAGNOSIS — R82998 Other abnormal findings in urine: Secondary | ICD-10-CM | POA: Diagnosis not present

## 2019-02-20 DIAGNOSIS — M1990 Unspecified osteoarthritis, unspecified site: Secondary | ICD-10-CM | POA: Diagnosis not present

## 2019-02-20 DIAGNOSIS — Q2730 Arteriovenous malformation, site unspecified: Secondary | ICD-10-CM | POA: Diagnosis not present

## 2019-02-20 DIAGNOSIS — Z0000 Encounter for general adult medical examination without abnormal findings: Secondary | ICD-10-CM | POA: Diagnosis not present

## 2019-02-20 DIAGNOSIS — I10 Essential (primary) hypertension: Secondary | ICD-10-CM | POA: Diagnosis not present

## 2019-02-20 DIAGNOSIS — E785 Hyperlipidemia, unspecified: Secondary | ICD-10-CM | POA: Diagnosis not present

## 2019-02-20 DIAGNOSIS — M25571 Pain in right ankle and joints of right foot: Secondary | ICD-10-CM | POA: Diagnosis not present

## 2019-02-20 DIAGNOSIS — G629 Polyneuropathy, unspecified: Secondary | ICD-10-CM | POA: Diagnosis not present

## 2019-02-20 DIAGNOSIS — M4800 Spinal stenosis, site unspecified: Secondary | ICD-10-CM | POA: Diagnosis not present

## 2019-02-20 DIAGNOSIS — E042 Nontoxic multinodular goiter: Secondary | ICD-10-CM | POA: Diagnosis not present

## 2019-02-20 DIAGNOSIS — M8580 Other specified disorders of bone density and structure, unspecified site: Secondary | ICD-10-CM | POA: Diagnosis not present

## 2019-02-20 DIAGNOSIS — D649 Anemia, unspecified: Secondary | ICD-10-CM | POA: Diagnosis not present

## 2019-02-26 DIAGNOSIS — Z1212 Encounter for screening for malignant neoplasm of rectum: Secondary | ICD-10-CM | POA: Diagnosis not present

## 2019-03-17 ENCOUNTER — Other Ambulatory Visit: Payer: Self-pay | Admitting: Cardiology

## 2019-03-17 DIAGNOSIS — I10 Essential (primary) hypertension: Secondary | ICD-10-CM

## 2019-04-24 DIAGNOSIS — D225 Melanocytic nevi of trunk: Secondary | ICD-10-CM | POA: Diagnosis not present

## 2019-04-24 DIAGNOSIS — L570 Actinic keratosis: Secondary | ICD-10-CM | POA: Diagnosis not present

## 2019-04-24 DIAGNOSIS — D1801 Hemangioma of skin and subcutaneous tissue: Secondary | ICD-10-CM | POA: Diagnosis not present

## 2019-04-24 DIAGNOSIS — D2271 Melanocytic nevi of right lower limb, including hip: Secondary | ICD-10-CM | POA: Diagnosis not present

## 2019-04-24 DIAGNOSIS — L821 Other seborrheic keratosis: Secondary | ICD-10-CM | POA: Diagnosis not present

## 2019-04-24 DIAGNOSIS — Z85828 Personal history of other malignant neoplasm of skin: Secondary | ICD-10-CM | POA: Diagnosis not present

## 2019-06-16 ENCOUNTER — Other Ambulatory Visit: Payer: Self-pay | Admitting: Cardiology

## 2019-06-16 DIAGNOSIS — I10 Essential (primary) hypertension: Secondary | ICD-10-CM

## 2019-06-25 DIAGNOSIS — Z23 Encounter for immunization: Secondary | ICD-10-CM | POA: Diagnosis not present

## 2019-07-22 DIAGNOSIS — Z23 Encounter for immunization: Secondary | ICD-10-CM | POA: Diagnosis not present

## 2019-09-09 DIAGNOSIS — M1990 Unspecified osteoarthritis, unspecified site: Secondary | ICD-10-CM | POA: Diagnosis not present

## 2019-09-09 DIAGNOSIS — I739 Peripheral vascular disease, unspecified: Secondary | ICD-10-CM | POA: Diagnosis not present

## 2019-09-09 DIAGNOSIS — I10 Essential (primary) hypertension: Secondary | ICD-10-CM | POA: Diagnosis not present

## 2019-09-09 DIAGNOSIS — Z0000 Encounter for general adult medical examination without abnormal findings: Secondary | ICD-10-CM | POA: Diagnosis not present

## 2019-09-09 DIAGNOSIS — R569 Unspecified convulsions: Secondary | ICD-10-CM | POA: Diagnosis not present

## 2019-09-09 DIAGNOSIS — M4800 Spinal stenosis, site unspecified: Secondary | ICD-10-CM | POA: Diagnosis not present

## 2019-09-09 DIAGNOSIS — G629 Polyneuropathy, unspecified: Secondary | ICD-10-CM | POA: Diagnosis not present

## 2019-09-09 DIAGNOSIS — Z79899 Other long term (current) drug therapy: Secondary | ICD-10-CM | POA: Diagnosis not present

## 2019-09-22 ENCOUNTER — Other Ambulatory Visit: Payer: Self-pay | Admitting: Cardiology

## 2019-09-22 DIAGNOSIS — I10 Essential (primary) hypertension: Secondary | ICD-10-CM

## 2019-09-30 ENCOUNTER — Ambulatory Visit (HOSPITAL_COMMUNITY)
Admission: RE | Admit: 2019-09-30 | Discharge: 2019-09-30 | Disposition: A | Discharge status: Home / Self Care | Payer: Medicare Other | Source: Ambulatory Visit | Attending: Vascular Surgery | Admitting: Vascular Surgery

## 2019-09-30 ENCOUNTER — Other Ambulatory Visit (HOSPITAL_COMMUNITY): Payer: Self-pay | Admitting: Internal Medicine

## 2019-09-30 ENCOUNTER — Encounter (HOSPITAL_COMMUNITY): Payer: Medicare Other

## 2019-09-30 ENCOUNTER — Other Ambulatory Visit: Payer: Self-pay

## 2019-09-30 DIAGNOSIS — I739 Peripheral vascular disease, unspecified: Secondary | ICD-10-CM

## 2019-11-17 DIAGNOSIS — L570 Actinic keratosis: Secondary | ICD-10-CM | POA: Diagnosis not present

## 2019-11-17 DIAGNOSIS — I10 Essential (primary) hypertension: Secondary | ICD-10-CM | POA: Diagnosis not present

## 2019-11-17 DIAGNOSIS — H938X2 Other specified disorders of left ear: Secondary | ICD-10-CM | POA: Diagnosis not present

## 2019-12-24 ENCOUNTER — Other Ambulatory Visit: Payer: Self-pay | Admitting: Cardiology

## 2019-12-24 DIAGNOSIS — I10 Essential (primary) hypertension: Secondary | ICD-10-CM

## 2020-01-02 NOTE — Progress Notes (Signed)
[name] Date of Birth: [birth year]   History of Present Illness: [name] is seen for yearly followup. She has a history of mildly abnormal myoview study in 2010. She has HTN. She has been treated conservatively and has done well.    She is now living at [facility]. She is widowed. She denies any chest pain or shortness of breath.  She does have neuropathy in her legs. Walks with a cane or rollator. No TIA or CVA symptoms. Had LE arterial dopplers in April which were normal. Notes she is not on ASA due to history of cerebral AV malformation she has had since age 84.   Current Outpatient Medications on File Prior to Visit  Medication Sig Dispense Refill  . Calcium Carbonate-Vitamin D (CALTRATE 600+D PO) Take by mouth daily.     . carvedilol (COREG) 25 MG tablet TAKE 1 TABLET BY MOUTH TWICE DAILY. 180 tablet 1  . Cyanocobalamin (VITAMIN B 12) 500 MCG TABS Take by mouth.    . Ibuprofen (ADVIL PO) Take by mouth as needed.      . Multiple Vitamin (MULTIVITAMIN) tablet Take 1 tablet by mouth daily.      Marland Kitchen PHENobarbital (LUMINAL) 32.4 MG tablet Take 64.8 mg by mouth daily.     . phenytoin (DILANTIN) 100 MG ER capsule Take 200 mg by mouth daily.     . simvastatin (ZOCOR) 20 MG tablet Take 20 mg by mouth at bedtime.       No current facility-administered medications on file prior to visit.    Allergies  Allergen Reactions  . Naproxen Sodium     REACTION: rash  . Penicillins     REACTION: hives  . Pravastatin   . Sulfa Drugs Cross Reactors     Past Medical History:  Diagnosis Date  . Abnormal cardiovascular stress test   . Carotid arterial disease (HCC)    MODERATE RIGHT  . Cerebral AV malformation   . DDD (degenerative disc disease)    SEVERE WITH SPINAL STENOSIS  . Hypercholesterolemia   . Hypertension   . MVP (mitral valve prolapse)    MILD  . Numbness    LEFT FOOT AND ANKLE  . Osteopenia   . Pseudo-gout     Past Surgical History:  Procedure Laterality Date   . APPENDECTOMY    . BREAST MASS EXCISION    . CARPAL TUNNEL RELEASE    . REMOVAL OF PARATHYROID GLAND    . STRESS NUCLEAR STUDY     ABNORMAL  . TONSILLECTOMY      Social History   Tobacco Use  Smoking Status Former Smoker  . Packs/day: 1.00  . Years: 14.00  . Pack years: 14.00  . Types: Cigarettes  . Quit date: 11/07/1968  . Years since quitting: 51.1  Smokeless Tobacco Never Used    Social History   Substance and Sexual Activity  Alcohol Use No    Family History  Problem Relation Age of Onset  . Stroke Mother   . Lung cancer Father   . Breast cancer Neg Hx     Review of Systems: As the history of present illness.  All other systems were reviewed and are negative.  Physical Exam: BP (!) 172/62   Pulse 71   Ht 5\' 6"  (1.676 m)   Wt 154 lb (69.9 kg)   SpO2 97%   BMI 24.86 kg/m  GENERAL:  Well appearing elderly WF in NAD HEENT:  PERRL, EOMI, sclera are clear. Oropharynx is  clear. NECK:  No jugular venous distention, carotids with bilateral bruits, no thyromegaly or adenopathy LUNGS:  Clear to auscultation bilaterally CHEST:  Unremarkable HEART:  RRR,  PMI not displaced or sustained,S1 and S2 within normal limits, no S3, no S4: no clicks, no rubs, no murmurs ABD:  Soft, nontender. BS +, no masses or bruits. No hepatomegaly, no splenomegaly EXT:  2 + pulses throughout, no edema, no cyanosis no clubbing SKIN:  Warm and dry.  No rashes NEURO:  Alert and oriented x 3. Cranial nerves II through XII intact. PSYCH:  Cognitively intact    LABORATORY DATA: Labs dated 12/23/15: Cholesterol 204, triglycerides 43, HDL 85, LDL 110. CMET and TSH normal. Hgb 10.7. Dated 01/14/17: cholesterol 198, triglycerides 42, HDL 86, LDL 104. Hgb 10.1. Chemistries and TSH normal. Dated 01/15/18: cholesterol 223, triglycerides 52, HDL 97, LDL 116. Hgb 10.1. CMET and TSH normal Dated 02/13/19: cholesterol 214, triglycerides 55, HDL 87, LDL 116.  Dated 09/09/19: Hgb 10.1. CMET and TSH  normal.  Ecg today shows NSR with rate 71. Incomplete RBBB. T wave inversion V1-3.  I have personally reviewed and interpreted this study.  Carotid dopplers today : 40-59% RICA stenosis. 47-42% LICA stenosis. Bilateral > 50% ECA stenosis.    Assessment / Plan: 1. Hypertension-blood pressure is high today but patient reports consistently good BP readings at home. Continue current Coreg  2.  Hyperlipidemia. On simvastatin. Due for follow up lab work at Dr [employer]. With carotid arterial disease goal LDL , 70.   3. Mildly abnormal stress test in 2010. Unclear if this is artifact. Patient has remained asymptomatic. We'll continue risk factor modification.  4. Carotid bruits. Moderate carotid disease.  She is asymptomatic. Avoid antiplatelet therapy due to cerebral AVM. Repeat dopplers in one year.   5. Thyroid nodules. Felt to be benign  Follow up in one year.

## 2020-01-05 ENCOUNTER — Encounter: Payer: Self-pay | Admitting: Cardiology

## 2020-01-05 ENCOUNTER — Ambulatory Visit (HOSPITAL_COMMUNITY)
Admission: RE | Admit: 2020-01-05 | Discharge: 2020-01-05 | Disposition: A | Discharge status: Home / Self Care | Payer: Medicare Other | Source: Ambulatory Visit | Attending: Cardiology | Admitting: Cardiology

## 2020-01-05 ENCOUNTER — Other Ambulatory Visit: Payer: Self-pay

## 2020-01-05 ENCOUNTER — Ambulatory Visit (INDEPENDENT_AMBULATORY_CARE_PROVIDER_SITE_OTHER): Payer: Medicare Other | Admitting: Cardiology

## 2020-01-05 ENCOUNTER — Other Ambulatory Visit (HOSPITAL_COMMUNITY): Payer: Self-pay | Admitting: Cardiology

## 2020-01-05 VITALS — BP 172/62 | HR 71 | Ht 66.0 in | Wt 154.0 lb

## 2020-01-05 DIAGNOSIS — I6523 Occlusion and stenosis of bilateral carotid arteries: Secondary | ICD-10-CM | POA: Diagnosis not present

## 2020-01-05 DIAGNOSIS — I10 Essential (primary) hypertension: Secondary | ICD-10-CM

## 2020-01-05 DIAGNOSIS — E7800 Pure hypercholesterolemia, unspecified: Secondary | ICD-10-CM

## 2020-01-05 NOTE — Patient Instructions (Signed)
Medication Instructions:  Continue same medications *If you need a refill on your cardiac medications before your next appointment, please call your pharmacy*   Lab Work: None ordered   Testing/Procedures: Schedule Carotid dopplers in 1 year  12/2020   Follow-Up: At [facility], you and your health needs are our priority.  As part of our continuing mission to provide you with exceptional heart care, we have created designated Provider Care Teams.  These Care Teams include your primary Cardiologist (physician) and Advanced Practice Providers (APPs -  Physician Assistants and Nurse Practitioners) who all work together to provide you with the care you need, when you need it.  We recommend signing up for the patient portal called "MyChart".  Sign up information is provided on this After Visit Summary.  MyChart is used to connect with patients for Virtual Visits (Telemedicine).  Patients are able to view lab/test results, encounter notes, upcoming appointments, etc.  Non-urgent messages can be sent to your provider as well.   To learn more about what you can do with MyChart, go to [email or web address].    Your next appointment:  1 year    Call in April to scheduled July appointment    The format for your next appointment: Office    Provider:  Dr.[name]

## 2020-02-11 DIAGNOSIS — H10413 Chronic giant papillary conjunctivitis, bilateral: Secondary | ICD-10-CM | POA: Diagnosis not present

## 2020-02-17 DIAGNOSIS — R569 Unspecified convulsions: Secondary | ICD-10-CM | POA: Diagnosis not present

## 2020-02-17 DIAGNOSIS — E042 Nontoxic multinodular goiter: Secondary | ICD-10-CM | POA: Diagnosis not present

## 2020-02-17 DIAGNOSIS — M859 Disorder of bone density and structure, unspecified: Secondary | ICD-10-CM | POA: Diagnosis not present

## 2020-02-17 DIAGNOSIS — E785 Hyperlipidemia, unspecified: Secondary | ICD-10-CM | POA: Diagnosis not present

## 2020-02-17 DIAGNOSIS — I10 Essential (primary) hypertension: Secondary | ICD-10-CM | POA: Diagnosis not present

## 2020-02-18 DIAGNOSIS — H10413 Chronic giant papillary conjunctivitis, bilateral: Secondary | ICD-10-CM | POA: Diagnosis not present

## 2020-02-24 DIAGNOSIS — Z23 Encounter for immunization: Secondary | ICD-10-CM | POA: Diagnosis not present

## 2020-02-24 DIAGNOSIS — R82998 Other abnormal findings in urine: Secondary | ICD-10-CM | POA: Diagnosis not present

## 2020-02-24 DIAGNOSIS — M4800 Spinal stenosis, site unspecified: Secondary | ICD-10-CM | POA: Diagnosis not present

## 2020-02-24 DIAGNOSIS — I10 Essential (primary) hypertension: Secondary | ICD-10-CM | POA: Diagnosis not present

## 2020-02-24 DIAGNOSIS — R2681 Unsteadiness on feet: Secondary | ICD-10-CM | POA: Diagnosis not present

## 2020-02-24 DIAGNOSIS — E785 Hyperlipidemia, unspecified: Secondary | ICD-10-CM | POA: Diagnosis not present

## 2020-02-24 DIAGNOSIS — D649 Anemia, unspecified: Secondary | ICD-10-CM | POA: Diagnosis not present

## 2020-02-24 DIAGNOSIS — E042 Nontoxic multinodular goiter: Secondary | ICD-10-CM | POA: Diagnosis not present

## 2020-02-24 DIAGNOSIS — Z0000 Encounter for general adult medical examination without abnormal findings: Secondary | ICD-10-CM | POA: Diagnosis not present

## 2020-02-24 DIAGNOSIS — I739 Peripheral vascular disease, unspecified: Secondary | ICD-10-CM | POA: Diagnosis not present

## 2020-03-04 DIAGNOSIS — G63 Polyneuropathy in diseases classified elsewhere: Secondary | ICD-10-CM | POA: Diagnosis not present

## 2020-03-04 DIAGNOSIS — M48061 Spinal stenosis, lumbar region without neurogenic claudication: Secondary | ICD-10-CM | POA: Diagnosis not present

## 2020-03-04 DIAGNOSIS — M62562 Muscle wasting and atrophy, not elsewhere classified, left lower leg: Secondary | ICD-10-CM | POA: Diagnosis not present

## 2020-03-04 DIAGNOSIS — R2681 Unsteadiness on feet: Secondary | ICD-10-CM | POA: Diagnosis not present

## 2020-03-04 DIAGNOSIS — R2689 Other abnormalities of gait and mobility: Secondary | ICD-10-CM | POA: Diagnosis not present

## 2020-03-04 DIAGNOSIS — M62561 Muscle wasting and atrophy, not elsewhere classified, right lower leg: Secondary | ICD-10-CM | POA: Diagnosis not present

## 2020-03-04 DIAGNOSIS — Z9181 History of falling: Secondary | ICD-10-CM | POA: Diagnosis not present

## 2020-03-08 DIAGNOSIS — Z9181 History of falling: Secondary | ICD-10-CM | POA: Diagnosis not present

## 2020-03-08 DIAGNOSIS — R2681 Unsteadiness on feet: Secondary | ICD-10-CM | POA: Diagnosis not present

## 2020-03-08 DIAGNOSIS — M62562 Muscle wasting and atrophy, not elsewhere classified, left lower leg: Secondary | ICD-10-CM | POA: Diagnosis not present

## 2020-03-08 DIAGNOSIS — M48061 Spinal stenosis, lumbar region without neurogenic claudication: Secondary | ICD-10-CM | POA: Diagnosis not present

## 2020-03-08 DIAGNOSIS — R2689 Other abnormalities of gait and mobility: Secondary | ICD-10-CM | POA: Diagnosis not present

## 2020-03-08 DIAGNOSIS — M62561 Muscle wasting and atrophy, not elsewhere classified, right lower leg: Secondary | ICD-10-CM | POA: Diagnosis not present

## 2020-03-11 DIAGNOSIS — R2681 Unsteadiness on feet: Secondary | ICD-10-CM | POA: Diagnosis not present

## 2020-03-11 DIAGNOSIS — G63 Polyneuropathy in diseases classified elsewhere: Secondary | ICD-10-CM | POA: Diagnosis not present

## 2020-03-11 DIAGNOSIS — R2689 Other abnormalities of gait and mobility: Secondary | ICD-10-CM | POA: Diagnosis not present

## 2020-03-11 DIAGNOSIS — M48061 Spinal stenosis, lumbar region without neurogenic claudication: Secondary | ICD-10-CM | POA: Diagnosis not present

## 2020-03-11 DIAGNOSIS — Z9181 History of falling: Secondary | ICD-10-CM | POA: Diagnosis not present

## 2020-03-11 DIAGNOSIS — M62561 Muscle wasting and atrophy, not elsewhere classified, right lower leg: Secondary | ICD-10-CM | POA: Diagnosis not present

## 2020-03-11 DIAGNOSIS — M62562 Muscle wasting and atrophy, not elsewhere classified, left lower leg: Secondary | ICD-10-CM | POA: Diagnosis not present

## 2020-03-14 DIAGNOSIS — R2681 Unsteadiness on feet: Secondary | ICD-10-CM | POA: Diagnosis not present

## 2020-03-14 DIAGNOSIS — R2689 Other abnormalities of gait and mobility: Secondary | ICD-10-CM | POA: Diagnosis not present

## 2020-03-14 DIAGNOSIS — M48061 Spinal stenosis, lumbar region without neurogenic claudication: Secondary | ICD-10-CM | POA: Diagnosis not present

## 2020-03-14 DIAGNOSIS — Z9181 History of falling: Secondary | ICD-10-CM | POA: Diagnosis not present

## 2020-03-14 DIAGNOSIS — M62562 Muscle wasting and atrophy, not elsewhere classified, left lower leg: Secondary | ICD-10-CM | POA: Diagnosis not present

## 2020-03-14 DIAGNOSIS — M62561 Muscle wasting and atrophy, not elsewhere classified, right lower leg: Secondary | ICD-10-CM | POA: Diagnosis not present

## 2020-03-25 DIAGNOSIS — M62561 Muscle wasting and atrophy, not elsewhere classified, right lower leg: Secondary | ICD-10-CM | POA: Diagnosis not present

## 2020-03-25 DIAGNOSIS — R2689 Other abnormalities of gait and mobility: Secondary | ICD-10-CM | POA: Diagnosis not present

## 2020-03-25 DIAGNOSIS — Z9181 History of falling: Secondary | ICD-10-CM | POA: Diagnosis not present

## 2020-03-25 DIAGNOSIS — M62562 Muscle wasting and atrophy, not elsewhere classified, left lower leg: Secondary | ICD-10-CM | POA: Diagnosis not present

## 2020-03-25 DIAGNOSIS — R2681 Unsteadiness on feet: Secondary | ICD-10-CM | POA: Diagnosis not present

## 2020-03-25 DIAGNOSIS — M48061 Spinal stenosis, lumbar region without neurogenic claudication: Secondary | ICD-10-CM | POA: Diagnosis not present

## 2020-03-28 DIAGNOSIS — R2689 Other abnormalities of gait and mobility: Secondary | ICD-10-CM | POA: Diagnosis not present

## 2020-03-28 DIAGNOSIS — M48061 Spinal stenosis, lumbar region without neurogenic claudication: Secondary | ICD-10-CM | POA: Diagnosis not present

## 2020-03-28 DIAGNOSIS — M62562 Muscle wasting and atrophy, not elsewhere classified, left lower leg: Secondary | ICD-10-CM | POA: Diagnosis not present

## 2020-03-28 DIAGNOSIS — R2681 Unsteadiness on feet: Secondary | ICD-10-CM | POA: Diagnosis not present

## 2020-03-28 DIAGNOSIS — Z9181 History of falling: Secondary | ICD-10-CM | POA: Diagnosis not present

## 2020-03-28 DIAGNOSIS — M62561 Muscle wasting and atrophy, not elsewhere classified, right lower leg: Secondary | ICD-10-CM | POA: Diagnosis not present

## 2020-04-01 DIAGNOSIS — Z9181 History of falling: Secondary | ICD-10-CM | POA: Diagnosis not present

## 2020-04-01 DIAGNOSIS — M62561 Muscle wasting and atrophy, not elsewhere classified, right lower leg: Secondary | ICD-10-CM | POA: Diagnosis not present

## 2020-04-01 DIAGNOSIS — R2681 Unsteadiness on feet: Secondary | ICD-10-CM | POA: Diagnosis not present

## 2020-04-01 DIAGNOSIS — R2689 Other abnormalities of gait and mobility: Secondary | ICD-10-CM | POA: Diagnosis not present

## 2020-04-01 DIAGNOSIS — M48061 Spinal stenosis, lumbar region without neurogenic claudication: Secondary | ICD-10-CM | POA: Diagnosis not present

## 2020-04-01 DIAGNOSIS — M62562 Muscle wasting and atrophy, not elsewhere classified, left lower leg: Secondary | ICD-10-CM | POA: Diagnosis not present

## 2020-04-15 DIAGNOSIS — Z9181 History of falling: Secondary | ICD-10-CM | POA: Diagnosis not present

## 2020-04-15 DIAGNOSIS — R2689 Other abnormalities of gait and mobility: Secondary | ICD-10-CM | POA: Diagnosis not present

## 2020-04-15 DIAGNOSIS — M62561 Muscle wasting and atrophy, not elsewhere classified, right lower leg: Secondary | ICD-10-CM | POA: Diagnosis not present

## 2020-04-15 DIAGNOSIS — M62562 Muscle wasting and atrophy, not elsewhere classified, left lower leg: Secondary | ICD-10-CM | POA: Diagnosis not present

## 2020-04-15 DIAGNOSIS — M48061 Spinal stenosis, lumbar region without neurogenic claudication: Secondary | ICD-10-CM | POA: Diagnosis not present

## 2020-04-15 DIAGNOSIS — R2681 Unsteadiness on feet: Secondary | ICD-10-CM | POA: Diagnosis not present

## 2020-04-15 DIAGNOSIS — G63 Polyneuropathy in diseases classified elsewhere: Secondary | ICD-10-CM | POA: Diagnosis not present

## 2020-04-22 DIAGNOSIS — Z85828 Personal history of other malignant neoplasm of skin: Secondary | ICD-10-CM | POA: Diagnosis not present

## 2020-04-22 DIAGNOSIS — D225 Melanocytic nevi of trunk: Secondary | ICD-10-CM | POA: Diagnosis not present

## 2020-04-22 DIAGNOSIS — L814 Other melanin hyperpigmentation: Secondary | ICD-10-CM | POA: Diagnosis not present

## 2020-04-22 DIAGNOSIS — L821 Other seborrheic keratosis: Secondary | ICD-10-CM | POA: Diagnosis not present

## 2020-04-22 DIAGNOSIS — D2271 Melanocytic nevi of right lower limb, including hip: Secondary | ICD-10-CM | POA: Diagnosis not present

## 2020-04-29 DIAGNOSIS — Z1212 Encounter for screening for malignant neoplasm of rectum: Secondary | ICD-10-CM | POA: Diagnosis not present

## 2020-06-24 ENCOUNTER — Other Ambulatory Visit: Payer: Self-pay | Admitting: Cardiology

## 2020-06-24 DIAGNOSIS — I10 Essential (primary) hypertension: Secondary | ICD-10-CM

## 2020-07-06 DIAGNOSIS — J069 Acute upper respiratory infection, unspecified: Secondary | ICD-10-CM | POA: Diagnosis not present

## 2020-07-06 DIAGNOSIS — Z1152 Encounter for screening for COVID-19: Secondary | ICD-10-CM | POA: Diagnosis not present

## 2020-07-06 DIAGNOSIS — R197 Diarrhea, unspecified: Secondary | ICD-10-CM | POA: Diagnosis not present

## 2020-07-19 ENCOUNTER — Encounter: Payer: Self-pay | Admitting: Internal Medicine

## 2020-07-19 ENCOUNTER — Non-Acute Institutional Stay (SKILLED_NURSING_FACILITY): Payer: Medicare Other | Admitting: Internal Medicine

## 2020-07-19 DIAGNOSIS — M48061 Spinal stenosis, lumbar region without neurogenic claudication: Secondary | ICD-10-CM | POA: Diagnosis not present

## 2020-07-19 DIAGNOSIS — R197 Diarrhea, unspecified: Secondary | ICD-10-CM | POA: Diagnosis not present

## 2020-07-19 DIAGNOSIS — G9341 Metabolic encephalopathy: Secondary | ICD-10-CM | POA: Diagnosis not present

## 2020-07-19 DIAGNOSIS — E782 Mixed hyperlipidemia: Secondary | ICD-10-CM

## 2020-07-19 DIAGNOSIS — G40209 Localization-related (focal) (partial) symptomatic epilepsy and epileptic syndromes with complex partial seizures, not intractable, without status epilepticus: Secondary | ICD-10-CM | POA: Diagnosis not present

## 2020-07-19 DIAGNOSIS — E871 Hypo-osmolality and hyponatremia: Secondary | ICD-10-CM | POA: Diagnosis not present

## 2020-07-19 DIAGNOSIS — I10 Essential (primary) hypertension: Secondary | ICD-10-CM

## 2020-07-19 DIAGNOSIS — R269 Unspecified abnormalities of gait and mobility: Secondary | ICD-10-CM | POA: Diagnosis not present

## 2020-07-19 DIAGNOSIS — I739 Peripheral vascular disease, unspecified: Secondary | ICD-10-CM

## 2020-07-19 LAB — HEPATIC FUNCTION PANEL
ALT: 14 (ref 7–35)
AST: 30 (ref 13–35)

## 2020-07-19 LAB — CBC AND DIFFERENTIAL
HCT: 27 — AB (ref 36–46)
Hemoglobin: 9.6 — AB (ref 12.0–16.0)
Platelets: 181 (ref 150–399)
WBC: 6.4

## 2020-07-19 LAB — CBC: RBC: 2.85 — AB (ref 3.87–5.11)

## 2020-07-19 LAB — BASIC METABOLIC PANEL
BUN: 15 (ref 4–21)
CO2: 27 — AB (ref 13–22)
Chloride: 83 — AB (ref 99–108)
Glucose: 1
Potassium: 3.6 (ref 3.4–5.3)
Sodium: 121 — AB (ref 137–147)

## 2020-07-19 LAB — COMPREHENSIVE METABOLIC PANEL
Albumin: 3.6 (ref 3.5–5.0)
Calcium: 8.1 — AB (ref 8.7–10.7)

## 2020-07-19 NOTE — Progress Notes (Signed)
Provider:  Rexene Edison. Mariea Clonts, D.O., C.M.D. Location:  [facility] Room Number: 159 Place of Service:  SNF (31)  PCP: Burnard Bunting, MD Patient Care Team: Burnard Bunting, MD as PCP - General (Internal Medicine)  Extended Emergency Contact Information Primary Emergency Contact: Canby Mobile Phone: [phone] Relation: Daughter Secondary Emergency Contact: GARRETT, BOWRING Mobile Phone: [phone] Relation: Son  Code Status: DNR Goals of Care: Advanced Directive information Advanced Directives 07/27/2020  Does Patient Have a Medical Advance Directive? Yes  Type of Paramedic of [place];Living will  Does patient want to make changes to medical advance directive? -  Copy of [place] in Chart? -   Chief Complaint  Patient presents with  . New Admit To SNF    New admission to rehab for mental status change     HPI: Patient is a 85 y.o. female with PMH significant for bilateral carotid stenosis, htn, hyperlipidemia, cerebral AV malformation, osteopenia, pseudogout, lumbar DDD with spinal stenosis, mild MVP, left foot and ankle numbness and partial symptomatic epilepsy with complex partial seizures seen today for admission to [facility] rehab due to mental status change/cognitive decline.    Pt had called security herself late 07/18/20 at night c/o confusion.  She was saying things off and on that did not make sense.  She would get frustrated and had challenges finding the right words.    When seen today, it turns out she has been having over 2 wks of diarrhea and an upper respiratory infection.  She's tried following the BRAT diet per Dr. Jacquiline Doe advice after a liquid diet for a period of time, but each time she adds new things back in, the diarrhea returns.  It's been watery, not particularly foul odor, no blood or melena.  She's been afebrile, but feels very weak when normally she ambulates with  a walker.  She has spinal stenosis and had some therapy here as an outpatient late last year due to worsening pain and decreased mobility.    Her daughter was visiting and we discussed next steps and trying to keep her here for management unless she became unstable.  Labs have not been done yet.    Past Medical History:  Diagnosis Date  . Abnormal cardiovascular stress test   . Carotid arterial disease (HCC)    MODERATE RIGHT  . Cerebral AV malformation   . DDD (degenerative disc disease)    SEVERE WITH SPINAL STENOSIS  . Hypercholesterolemia   . Hypertension   . MVP (mitral valve prolapse)    MILD  . Numbness    LEFT FOOT AND ANKLE  . Osteopenia   . Pseudo-gout    Past Surgical History:  Procedure Laterality Date  . APPENDECTOMY    . BREAST MASS EXCISION    . CARPAL TUNNEL RELEASE    . REMOVAL OF PARATHYROID GLAND    . STRESS NUCLEAR STUDY     ABNORMAL  . TONSILLECTOMY      Social History   Socioeconomic History  . Marital status: Married    Spouse name: Not on file  . Number of children: 3  . Years of education: Not on file  . Highest education level: Not on file  Occupational History  . Not on file  Tobacco Use  . Smoking status: Former Smoker    Packs/day: 1.00    Years: 14.00    Pack years: 14.00    Types: Cigarettes    Quit date: 11/07/1968  Years since quitting: 51.7  . Smokeless tobacco: Never Used  Substance and Sexual Activity  . Alcohol use: No  . Drug use: No  . Sexual activity: Not on file  Other Topics Concern  . Not on file  Social History Narrative   [place] garden home at [place] Strain: Not on file  Food Insecurity: Not on file  Transportation Needs: Not on file  Physical Activity: Not on file  Stress: Not on file  Social Connections: Not on file    reports that she quit smoking about 51 years ago. Her smoking use included cigarettes. She has a 14.00 pack-year smoking history.  She has never used smokeless tobacco. She reports that she does not drink alcohol and does not use drugs.  Functional Status Survey:    Family History  Problem Relation Age of Onset  . Stroke Mother   . Lung cancer Father   . Breast cancer Neg Hx     Health Maintenance  Topic Date Due  . TETANUS/TDAP  Never done  . DEXA SCAN  Never done  . PNA vac Low Risk Adult (1 of 2 - PCV13) Never done  . INFLUENZA VACCINE  Completed  . COVID-19 Vaccine  Completed    Allergies  Allergen Reactions  . Azithromycin Other (See Comments)    unknown  . Naproxen Sodium     REACTION: rash  . Penicillins     REACTION: hives  . Pravastatin   . Sulfa Drugs Cross Reactors     Outpatient Encounter Medications as of 07/19/2020  Medication Sig  . carvedilol (COREG) 25 MG tablet TAKE 1 TABLET BY MOUTH TWICE DAILY. (Patient taking differently: Take 25 mg by mouth in the morning and at bedtime.)  . PHENobarbital (LUMINAL) 32.4 MG tablet Take 64.8 mg by mouth daily.  . phenytoin (DILANTIN) 100 MG ER capsule Take 200 mg by mouth daily.  . simvastatin (ZOCOR) 20 MG tablet Take 20 mg by mouth at bedtime.  . [DISCONTINUED] Calcium Carbonate-Vitamin D (CALTRATE 600+D PO) Take by mouth daily.   . [DISCONTINUED] Cyanocobalamin (VITAMIN B 12) 500 MCG TABS Take by mouth.  . [DISCONTINUED] Ibuprofen (ADVIL PO) Take by mouth as needed.    . [DISCONTINUED] Multiple Vitamin (MULTIVITAMIN) tablet Take 1 tablet by mouth daily.     No facility-administered encounter medications on file as of 07/19/2020.    Review of Systems  Constitutional: Positive for malaise/fatigue and weight loss. Negative for chills and fever.  HENT: Positive for hearing loss. Negative for congestion and sore throat.        Recovered from a prior URI  Eyes:       Glasses  Respiratory: Negative for cough, sputum production and shortness of breath.   Cardiovascular: Positive for leg swelling. Negative for chest pain and palpitations.        Chronic lower extremity edema a bit worse with liquid diet and decreased nutritional status  Gastrointestinal: Positive for diarrhea. Negative for abdominal pain, blood in stool, constipation, heartburn, melena, nausea and vomiting.  Genitourinary: Negative for dysuria.  Musculoskeletal: Positive for back pain. Negative for falls.  Skin: Negative for itching and rash.       Sore bottom from diarrhea and decreased mobility  Neurological: Positive for weakness. Negative for dizziness, loss of consciousness and headaches.       Has seizure disorder  Psychiatric/Behavioral: Negative for depression. The patient is not nervous/anxious and does not have  insomnia.        Normally quite lucid, but struggling more with word finding and giving wrong words at times, slower, some mild difficulty with history (recent amid being ill)    Vitals:   07/19/20 1625  BP: (!) 134/53  Pulse: 76  Temp: 99.9 F (37.7 C)  SpO2: 96%  Weight: 143 lb 9.6 oz (65.1 kg)  Height: 5\' 6"  (1.676 m)   Body mass index is 23.18 kg/m. Physical Exam Vitals and nursing note reviewed.  Constitutional:      General: She is not in acute distress.    Appearance: Normal appearance. She is not toxic-appearing.  HENT:     Head: Normocephalic and atraumatic.     Right Ear: External ear normal.     Left Ear: External ear normal.     Ears:     Comments: hoh    Nose: Nose normal.     Mouth/Throat:     Pharynx: Oropharynx is clear. No oropharyngeal exudate.  Eyes:     Conjunctiva/sclera: Conjunctivae normal.     Pupils: Pupils are equal, round, and reactive to light.     Comments: glasses  Cardiovascular:     Rate and Rhythm: Normal rate and regular rhythm.     Comments: Midsystolic click Pulmonary:     Effort: Pulmonary effort is normal.     Breath sounds: Normal breath sounds. No wheezing, rhonchi or rales.  Abdominal:     General: Bowel sounds are normal. There is no distension.     Palpations: Abdomen is soft. There  is no mass.     Tenderness: There is no abdominal tenderness. There is no guarding or rebound.  Skin:    General: Skin is warm and dry.     Coloration: Skin is pale.     Comments: Excoriation and erythema of buttocks  Neurological:     General: No focal deficit present.     Mental Status: She is alert.     Cranial Nerves: No cranial nerve deficit.     Motor: Weakness present.     Coordination: Coordination normal.     Gait: Gait abnormal.     Deep Tendon Reflexes: Reflexes normal.     Comments: Word-finding challenges intermittently, but then able to speak several sentences fluently, her daughter notes she is much slower at providing history than normal  Psychiatric:        Mood and Affect: Mood normal.        Behavior: Behavior normal.     Comments: See neuro above    Labs reviewed: Basic Metabolic Panel: Recent Labs    07/22/20 0804 07/22/20 1208 07/23/20 0442 07/24/20 0414 07/25/20 0203  NA 121*   < > 127* 127* 128*  K 3.7   < > 3.1* 3.8 3.5  CL 90*   < > 95* 98 99  CO2 23   < > 23 21* 20*  GLUCOSE 124*   < > 116* 115* 114*  BUN 7*   < > 8 14 23   CREATININE 0.71   < > 0.85 0.95 1.09*  CALCIUM 7.6*   < > 7.6* 7.7* 7.8*  MG 1.6*  --  1.4*  --  1.7  PHOS 2.3*  --  2.6  --  3.4   < > = values in this interval not displayed.   Liver Function Tests: Recent Labs    07/19/20 0000 07/21/20 1745  AST 30 31  ALT 14 16  ALKPHOS  --  7  BILITOT  --  1.0  PROT  --  6.6  ALBUMIN 3.6 3.5   No results for input(s): LIPASE, AMYLASE in the last 8760 hours. No results for input(s): AMMONIA in the last 8760 hours. CBC: Recent Labs    07/23/20 0442 07/24/20 0414 07/25/20 0203  WBC 10.3 8.4 6.8  NEUTROABS 8.2* 6.5 5.0  HGB 9.2* 8.3* 7.7*  HCT 26.0* 24.2* 23.3*  MCV 96.7 98.8 103.1*  PLT 165 154 169   Cardiac Enzymes: No results for input(s): CKTOTAL, CKMB, CKMBINDEX, TROPONINI in the last 8760 hours. BNP: Invalid input(s): POCBNP No results found for: HGBA1C Lab  Results  Component Value Date   TSH 1.685 07/21/2020   Lab Results  Component Value Date   VITAMINB12 1,311 (H) 07/24/2020   Lab Results  Component Value Date   FOLATE 13.5 07/24/2020   Lab Results  Component Value Date   IRON 9 (L) 07/24/2020   TIBC 122 (L) 07/24/2020   FERRITIN 236 07/24/2020    Imaging and Procedures obtained prior to SNF admission: VAS US CAROTID  Result Date: 01/07/2020 Carotid Arterial Duplex Study Indications:       Carotid artery disease and Patient denies any cerebrovascular                    symptoms. Risk Factors:      Hypertension, hyperlipidemia, past history of smoking. Comparison Study:  In 01/2018, a carotid duplex showed a RICA velocity of 180/49                    cm/s and a LICA velocity of 620/35 cm/s. Performing Technologist: Wilkie Aye RVT  Examination Guidelines: A complete evaluation includes B-mode imaging, spectral Doppler, color Doppler, and power Doppler as needed of all accessible portions of each vessel. Bilateral testing is considered an integral part of a complete examination. Limited examinations for reoccurring indications may be performed as noted.  Right Carotid Findings: +----------+--------+--------+--------+-------------------------+--------------+           PSV cm/sEDV cm/sStenosisPlaque Description       Comments       +----------+--------+--------+--------+-------------------------+--------------+ CCA Prox  89      25                                                      +----------+--------+--------+--------+-------------------------+--------------+ CCA Mid   84      25                                                      +----------+--------+--------+--------+-------------------------+--------------+ CCA Distal72      22                                                      +----------+--------+--------+--------+-------------------------+--------------+ ICA Prox  167     59      40-59%  heterogenous and          Shadowing  irregular                               +----------+--------+--------+--------+-------------------------+--------------+ ICA Mid   197     60                                       Turbulent flow +----------+--------+--------+--------+-------------------------+--------------+ ICA Distal151     49                                                      +----------+--------+--------+--------+-------------------------+--------------+ ECA       235     19      >50%    heterogenous and                                                          irregular                               +----------+--------+--------+--------+-------------------------+--------------+ +----------+--------+-------+----------------+-------------------+           PSV cm/sEDV cmsDescribe        Arm Pressure (mmHG) +----------+--------+-------+----------------+-------------------+ [id number]            Multiphasic, [id number]                 +----------+--------+-------+----------------+-------------------+ +---------+--------+---+--------+--+---------+ VertebralPSV cm/s115EDV cm/s32Antegrade +---------+--------+---+--------+--+---------+  Left Carotid Findings: +----------+--------+--------+--------+--------------------------+---------+           PSV cm/sEDV cm/sStenosisPlaque Description        Comments  +----------+--------+--------+--------+--------------------------+---------+ CCA Prox  97      30                                                  +----------+--------+--------+--------+--------------------------+---------+ CCA Mid   93      30      <50%    heterogenous                        +----------+--------+--------+--------+--------------------------+---------+ CCA Distal111     30                                                  +----------+--------+--------+--------+--------------------------+---------+  ICA Prox  154     60              heterogenous and irregularShadowing +----------+--------+--------+--------+--------------------------+---------+ ICA Mid   207     67      60-79%                                      +----------+--------+--------+--------+--------------------------+---------+ ICA Distal127     59                                                  +----------+--------+--------+--------+--------------------------+---------+  ECA       298     24      >50%    heterogenous and irregular          +----------+--------+--------+--------+--------------------------+---------+ +----------+--------+--------+----------------+-------------------+           PSV cm/sEDV cm/sDescribe        Arm Pressure (mmHG) +----------+--------+--------+----------------+-------------------+ [id number]             Multiphasic, [id number]                 +----------+--------+--------+----------------+-------------------+ +---------+--------+--+--------+--+-----------------------+ VertebralPSV cm/s85EDV cm/s25Antegrade and turbulent +---------+--------+--+--------+--+-----------------------+   Summary: Right Carotid: Velocities in the right ICA are consistent with a 40-59%                stenosis. The ECA appears >50% stenosed. Left Carotid: Velocities in the left ICA are consistent with a 60-79% stenosis.               Non-hemodynamically significant plaque <50% noted in the CCA. The               ECA appears >50% stenosed. Vertebrals:  Bilateral vertebral arteries demonstrate antegrade flow. Turbulent              flow in the left vertebral. Subclavians: Normal flow hemodynamics were seen in bilateral subclavian              arteries. *See table(s) above for measurements and observations. Suggest follow up study in 12 months. Electronically signed by Kathlyn Sacramento MD on 01/07/2020 at 1:14:44 PM.    Final     Assessment/Plan 1. Acute diarrhea -really more like subacute as going on  now for over 2 wks -diarrhea workup:  Stool culture, c diff PCR, fecal wbc, O+P, but may be more of a microscopic colitis considering it keeps coming back with added food items -treat dehydration as below -repeat basic labs after fluids  2. Acute metabolic encephalopathy -due to dehydration from diarrhea that has led to hyponatremia, but I'm concerned about her seizure disorder at this point  3. Partial symptomatic epilepsy with complex partial seizures, not intractable, without status epilepticus ([place]) -? Levels of dilantin and phenobarb are off due to her diarrhea/not keeping anything in -check levels with labs  4. Hyponatremia -symptomatic -appears she has a 4 liter fluid deficit despite her attempts at hydration with water and pedialyte at home -start NS at 80cc/hr x 2 liters and monitor for increased edema, dyspnea -back to full liquid diet and then plan to advance as she will tolerate  5. Mixed hyperlipidemia -continues on zocor qhs  6. Spinal stenosis of lumbar region, unspecified whether neurogenic claudication present -resume therapy here as above  7. Abnormality of gait -here for therapy but also workup of diarrhea and hyponatremia  8. Essential hypertension -monitor carefully in view of dehydration  Family/ staff Communication: d/w daughter who was present  Labs/tests ordered:  Cbc, bmp, liver panel, amylase, lipase, phenobarb, dilantin levels, stool studies above  Jann Ra L. Saurabh Hettich, D.O. [facility] [address], [place] [address] Cell Phone (Mon-Fri 8am-5pm):  [phone] On Call:  [phone] & follow prompts after 5pm & weekends Office Phone:  [phone] Office Fax:  [phone]

## 2020-07-20 ENCOUNTER — Encounter: Payer: Self-pay | Admitting: Internal Medicine

## 2020-07-20 DIAGNOSIS — R197 Diarrhea, unspecified: Secondary | ICD-10-CM | POA: Diagnosis not present

## 2020-07-20 DIAGNOSIS — D509 Iron deficiency anemia, unspecified: Secondary | ICD-10-CM | POA: Diagnosis not present

## 2020-07-20 DIAGNOSIS — G4751 Confusional arousals: Secondary | ICD-10-CM | POA: Diagnosis not present

## 2020-07-21 ENCOUNTER — Encounter: Payer: Self-pay | Admitting: Internal Medicine

## 2020-07-21 ENCOUNTER — Inpatient Hospital Stay (HOSPITAL_COMMUNITY)
Admission: EM | Admit: 2020-07-21 | Discharge: 2020-07-25 | DRG: 640 | Disposition: A | Discharge status: Skilled Nursing Facility | Payer: Medicare Other | Source: Skilled Nursing Facility | Attending: Internal Medicine | Admitting: Internal Medicine

## 2020-07-21 ENCOUNTER — Encounter (HOSPITAL_COMMUNITY): Payer: Self-pay

## 2020-07-21 ENCOUNTER — Other Ambulatory Visit: Payer: Self-pay

## 2020-07-21 DIAGNOSIS — R110 Nausea: Secondary | ICD-10-CM | POA: Diagnosis not present

## 2020-07-21 DIAGNOSIS — Z7401 Bed confinement status: Secondary | ICD-10-CM | POA: Diagnosis not present

## 2020-07-21 DIAGNOSIS — R4182 Altered mental status, unspecified: Secondary | ICD-10-CM | POA: Diagnosis not present

## 2020-07-21 DIAGNOSIS — I959 Hypotension, unspecified: Secondary | ICD-10-CM | POA: Diagnosis not present

## 2020-07-21 DIAGNOSIS — E861 Hypovolemia: Secondary | ICD-10-CM | POA: Diagnosis present

## 2020-07-21 DIAGNOSIS — E782 Mixed hyperlipidemia: Secondary | ICD-10-CM | POA: Diagnosis present

## 2020-07-21 DIAGNOSIS — Z66 Do not resuscitate: Secondary | ICD-10-CM | POA: Diagnosis present

## 2020-07-21 DIAGNOSIS — R197 Diarrhea, unspecified: Secondary | ICD-10-CM | POA: Diagnosis not present

## 2020-07-21 DIAGNOSIS — I10 Essential (primary) hypertension: Secondary | ICD-10-CM | POA: Diagnosis present

## 2020-07-21 DIAGNOSIS — Z87891 Personal history of nicotine dependence: Secondary | ICD-10-CM | POA: Diagnosis not present

## 2020-07-21 DIAGNOSIS — G40209 Localization-related (focal) (partial) symptomatic epilepsy and epileptic syndromes with complex partial seizures, not intractable, without status epilepticus: Secondary | ICD-10-CM | POA: Diagnosis present

## 2020-07-21 DIAGNOSIS — L89152 Pressure ulcer of sacral region, stage 2: Secondary | ICD-10-CM | POA: Diagnosis present

## 2020-07-21 DIAGNOSIS — E876 Hypokalemia: Secondary | ICD-10-CM | POA: Diagnosis present

## 2020-07-21 DIAGNOSIS — A084 Viral intestinal infection, unspecified: Secondary | ICD-10-CM | POA: Diagnosis present

## 2020-07-21 DIAGNOSIS — Z823 Family history of stroke: Secondary | ICD-10-CM | POA: Diagnosis not present

## 2020-07-21 DIAGNOSIS — Z20822 Contact with and (suspected) exposure to covid-19: Secondary | ICD-10-CM | POA: Diagnosis present

## 2020-07-21 DIAGNOSIS — E871 Hypo-osmolality and hyponatremia: Principal | ICD-10-CM | POA: Diagnosis present

## 2020-07-21 DIAGNOSIS — G9341 Metabolic encephalopathy: Secondary | ICD-10-CM | POA: Diagnosis present

## 2020-07-21 DIAGNOSIS — Z8669 Personal history of other diseases of the nervous system and sense organs: Secondary | ICD-10-CM | POA: Diagnosis not present

## 2020-07-21 DIAGNOSIS — L8990 Pressure ulcer of unspecified site, unspecified stage: Secondary | ICD-10-CM | POA: Insufficient documentation

## 2020-07-21 DIAGNOSIS — Z801 Family history of malignant neoplasm of trachea, bronchus and lung: Secondary | ICD-10-CM | POA: Diagnosis not present

## 2020-07-21 DIAGNOSIS — M2550 Pain in unspecified joint: Secondary | ICD-10-CM | POA: Diagnosis not present

## 2020-07-21 DIAGNOSIS — R531 Weakness: Secondary | ICD-10-CM | POA: Diagnosis not present

## 2020-07-21 DIAGNOSIS — D509 Iron deficiency anemia, unspecified: Secondary | ICD-10-CM | POA: Diagnosis not present

## 2020-07-21 LAB — COMPREHENSIVE METABOLIC PANEL
ALT: 16 U/L (ref 0–44)
AST: 31 U/L (ref 15–41)
Albumin: 3.5 g/dL (ref 3.5–5.0)
Alkaline Phosphatase: 56 U/L (ref 38–126)
Anion gap: 11 (ref 5–15)
BUN: 9 mg/dL (ref 8–23)
CO2: 24 mmol/L (ref 22–32)
Calcium: 7.6 mg/dL — ABNORMAL LOW (ref 8.9–10.3)
Chloride: 83 mmol/L — ABNORMAL LOW (ref 98–111)
Creatinine, Ser: 0.83 mg/dL (ref 0.44–1.00)
GFR, Estimated: >60 mL/min (ref >60)
Glucose, Bld: 150 mg/dL — ABNORMAL HIGH (ref 70–99)
Potassium: 3 mmol/L — ABNORMAL LOW (ref 3.5–5.1)
Sodium: 118 mmol/L — CL (ref 135–145)
Total Bilirubin: 1 mg/dL (ref 0.3–1.2)
Total Protein: 6.6 g/dL (ref 6.5–8.1)

## 2020-07-21 LAB — TSH: TSH: 1.685 u[IU]/mL (ref 0.350–4.500)

## 2020-07-21 LAB — BASIC METABOLIC PANEL
Anion gap: 11 (ref 5–15)
BUN: 8 mg/dL (ref 8–23)
CO2: 23 mmol/L (ref 22–32)
Calcium: 7.3 mg/dL — ABNORMAL LOW (ref 8.9–10.3)
Chloride: 85 mmol/L — ABNORMAL LOW (ref 98–111)
Creatinine, Ser: 0.72 mg/dL (ref 0.44–1.00)
GFR, Estimated: >60 mL/min (ref >60)
Glucose, Bld: 117 mg/dL — ABNORMAL HIGH (ref 70–99)
Potassium: 3.1 mmol/L — ABNORMAL LOW (ref 3.5–5.1)
Sodium: 119 mmol/L — CL (ref 135–145)

## 2020-07-21 LAB — CBC WITH DIFFERENTIAL/PLATELET
Abs Immature Granulocytes: 0.02 10*3/uL (ref 0.00–0.07)
Basophils Absolute: 0 10*3/uL (ref 0.0–0.1)
Basophils Relative: 0 %
Eosinophils Absolute: 0 10*3/uL (ref 0.0–0.5)
Eosinophils Relative: 0 %
HCT: 30 % — ABNORMAL LOW (ref 36.0–46.0)
Hemoglobin: 10.9 g/dL — ABNORMAL LOW (ref 12.0–15.0)
Immature Granulocytes: 0 %
Lymphocytes Relative: 4 %
Lymphs Abs: 0.4 10*3/uL — ABNORMAL LOW (ref 0.7–4.0)
MCH: 34.1 pg — ABNORMAL HIGH (ref 26.0–34.0)
MCHC: 36.3 g/dL — ABNORMAL HIGH (ref 30.0–36.0)
MCV: 93.8 fL (ref 80.0–100.0)
Monocytes Absolute: 1.4 10*3/uL — ABNORMAL HIGH (ref 0.1–1.0)
Monocytes Relative: 16 %
Neutro Abs: 7.1 10*3/uL (ref 1.7–7.7)
Neutrophils Relative %: 80 %
Platelets: 197 10*3/uL (ref 150–400)
RBC: 3.2 MIL/uL — ABNORMAL LOW (ref 3.87–5.11)
RDW: 11.6 % (ref 11.5–15.5)
WBC: 8.9 10*3/uL (ref 4.0–10.5)
nRBC: 0 % (ref 0.0–0.2)

## 2020-07-21 LAB — MAGNESIUM: Magnesium: 0.8 mg/dL — CL (ref 1.7–2.4)

## 2020-07-21 LAB — PHENYTOIN LEVEL, TOTAL: Phenytoin Lvl: 5.2 ug/mL — ABNORMAL LOW (ref 10.0–20.0)

## 2020-07-21 MED ORDER — ACETAMINOPHEN 325 MG PO TABS
650.0000 mg | ORAL_TABLET | Freq: Four times a day (QID) | ORAL | Status: DC | PRN
  Administered 2020-07-24: 650 mg via ORAL
  Filled 2020-07-21: qty 2

## 2020-07-21 MED ORDER — SODIUM CHLORIDE 0.9 % IV SOLN: INTRAVENOUS | Status: DC

## 2020-07-21 MED ORDER — ONDANSETRON HCL 4 MG PO TABS: 4.0000 mg | ORAL_TABLET | Freq: Four times a day (QID) | ORAL | Status: DC | PRN

## 2020-07-21 MED ORDER — PHENYTOIN SODIUM EXTENDED 100 MG PO CAPS
200.0000 mg | ORAL_CAPSULE | Freq: Every day | ORAL | Status: DC
  Administered 2020-07-22 – 2020-07-25 (×4): 200 mg via ORAL
  Filled 2020-07-21 (×4): qty 2

## 2020-07-21 MED ORDER — SIMVASTATIN 20 MG PO TABS
20.0000 mg | ORAL_TABLET | Freq: Every day | ORAL | Status: DC
  Administered 2020-07-21 – 2020-07-24 (×4): 20 mg via ORAL
  Filled 2020-07-21 (×4): qty 1

## 2020-07-21 MED ORDER — POTASSIUM CHLORIDE CRYS ER 20 MEQ PO TBCR
40.0000 meq | EXTENDED_RELEASE_TABLET | Freq: Once | ORAL | Status: AC
  Administered 2020-07-21: 40 meq via ORAL
  Filled 2020-07-21: qty 2

## 2020-07-21 MED ORDER — BOOST / RESOURCE BREEZE PO LIQD CUSTOM
1.0000 | Freq: Three times a day (TID) | ORAL | Status: DC
  Administered 2020-07-22 – 2020-07-25 (×10): 1 via ORAL

## 2020-07-21 MED ORDER — MAGNESIUM SULFATE 2 GM/50ML IV SOLN
2.0000 g | Freq: Once | INTRAVENOUS | Status: AC
  Administered 2020-07-21: 2 g via INTRAVENOUS
  Filled 2020-07-21: qty 50

## 2020-07-21 MED ORDER — CHLORHEXIDINE GLUCONATE CLOTH 2 % EX PADS
6.0000 | MEDICATED_PAD | Freq: Every day | CUTANEOUS | Status: DC
  Administered 2020-07-22 – 2020-07-23 (×2): 6 via TOPICAL

## 2020-07-21 MED ORDER — ACETAMINOPHEN 650 MG RE SUPP: 650.0000 mg | Freq: Four times a day (QID) | RECTAL | Status: DC | PRN

## 2020-07-21 MED ORDER — SODIUM CHLORIDE 0.9 % IV BOLUS
500.0000 mL | Freq: Once | INTRAVENOUS | Status: AC
  Administered 2020-07-21: 500 mL via INTRAVENOUS

## 2020-07-21 MED ORDER — PHENOBARBITAL 32.4 MG PO TABS
64.8000 mg | ORAL_TABLET | Freq: Every day | ORAL | Status: DC
Start: 2020-07-22 — End: 2020-07-25
  Administered 2020-07-22 – 2020-07-25 (×4): 64.8 mg via ORAL
  Filled 2020-07-21 (×4): qty 2

## 2020-07-21 MED ORDER — ENOXAPARIN SODIUM 40 MG/0.4ML ~~LOC~~ SOLN
40.0000 mg | SUBCUTANEOUS | Status: DC
  Administered 2020-07-21 – 2020-07-24 (×4): 40 mg via SUBCUTANEOUS
  Filled 2020-07-21 (×4): qty 0.4

## 2020-07-21 MED ORDER — CARVEDILOL 25 MG PO TABS
25.0000 mg | ORAL_TABLET | Freq: Two times a day (BID) | ORAL | Status: DC
  Administered 2020-07-22 – 2020-07-25 (×7): 25 mg via ORAL
  Filled 2020-07-21 (×2): qty 1
  Filled 2020-07-21 (×2): qty 2
  Filled 2020-07-21 (×4): qty 1

## 2020-07-21 MED ORDER — ONDANSETRON HCL 4 MG/2ML IJ SOLN: 4.0000 mg | Freq: Four times a day (QID) | INTRAMUSCULAR | Status: DC | PRN

## 2020-07-21 NOTE — ED Provider Notes (Signed)
[facility] DEPT Provider Note   CSN: [id number] Arrival date & time: 07/21/20  1640     History Chief Complaint  Patient presents with  . Abnormal Lab  . Weakness    [name] is a 85 y.o. female.  The history is provided by the patient.  Abnormal Lab Weakness  [name] is a 85 y.o. female who presents to the Emergency Department complaining of abnormal lab and generalized weakness. She presents the emergency department from [facility] for evaluation of generalized weakness. She states that 10 days ago she developed profuse watery diarrhea. She has ongoing diarrhea but only two episodes today. She had outpatient labs performed today, which demonstrated a sodium of 119. She was referred to the emergency department for further treatment. She has been treated with IV fluids at [facility].     labs obtained July 21, 2020. Calcium 7.3, creatinine .6, BUN 7.5, sodium 119, potassium 3.3, chloride 83, bicarb 23  According to mar she received normal saline at 80 mL per hour times 2 L starting February 9  She has been fully vaccinated and boosted for COVID-19  She denies any fevers, nausea, vomiting, abdominal pain. She does state that she feels generally week. She also reports having confusion increased compared to baseline over the last several days.  Past Medical History:  Diagnosis Date  . Abnormal cardiovascular stress test   . Carotid arterial disease (HCC)    MODERATE RIGHT  . Cerebral AV malformation   . DDD (degenerative disc disease)    SEVERE WITH SPINAL STENOSIS  . Hypercholesterolemia   . Hypertension   . MVP (mitral valve prolapse)    MILD  . Numbness    LEFT FOOT AND ANKLE  . Osteopenia   . Pseudo-gout     Patient Active Problem List   Diagnosis Date Noted  . Partial symptomatic epilepsy with complex partial seizures, not intractable, without status epilepticus ([place]) 07/28/2014  . Spinal  stenosis of lumbar region 07/28/2014  . Abnormality of gait 11/24/2013  . Low back pain 11/24/2013  . Hypertension   . Hypercholesterolemia   . Carotid arterial disease ([place])   . MVP (mitral valve prolapse)   . Abnormal cardiovascular stress test     Past Surgical History:  Procedure Laterality Date  . APPENDECTOMY    . BREAST MASS EXCISION    . CARPAL TUNNEL RELEASE    . REMOVAL OF PARATHYROID GLAND    . STRESS NUCLEAR STUDY     ABNORMAL  . TONSILLECTOMY       OB History   No obstetric history on file.     Family History  Problem Relation Age of Onset  . Stroke Mother   . Lung cancer Father   . Breast cancer Neg Hx     Social History   Tobacco Use  . Smoking status: Former Smoker    Packs/day: 1.00    Years: 14.00    Pack years: 14.00    Types: Cigarettes    Quit date: 11/07/1968    Years since quitting: 51.7  . Smokeless tobacco: Never Used  Substance Use Topics  . Alcohol use: No  . Drug use: No    Home Medications Prior to Admission medications   Medication Sig Start Date End Date Taking? Authorizing Provider  carvedilol (COREG) 25 MG tablet TAKE 1 TABLET BY MOUTH TWICE DAILY. Patient taking differently: Take 25 mg by mouth in the morning and at bedtime. 06/24/20  Yes Martinique, [name], MD  PHENobarbital (LUMINAL) 32.4 MG tablet Take 64.8 mg by mouth daily.   Yes [provider]  phenytoin (DILANTIN) 100 MG ER capsule Take 200 mg by mouth daily.   Yes [provider]  simvastatin (ZOCOR) 20 MG tablet Take 20 mg by mouth at bedtime.   Yes [provider]  sodium chloride 0.9 % infusion Inject 2,000 mLs into the vein continuous. 07/20/20  Yes [provider]    Allergies    Azithromycin, Naproxen sodium, Penicillins, Pravastatin, and Sulfa drugs cross reactors  Review of Systems   Review of Systems  Neurological: Positive for weakness.  All other systems reviewed and are negative.   Physical Exam Updated Vital  Signs BP (!) 128/47   Pulse 79   Temp 98.4 F (36.9 C) (Oral)   Resp 19   SpO2 98%   Physical Exam Vitals and nursing note reviewed.  Constitutional:      Appearance: She is well-developed and well-nourished.  HENT:     Head: Normocephalic and atraumatic.  Cardiovascular:     Rate and Rhythm: Normal rate and regular rhythm.     Heart sounds: No murmur heard.   Pulmonary:     Effort: Pulmonary effort is normal. No respiratory distress.     Breath sounds: Normal breath sounds.  Abdominal:     Palpations: Abdomen is soft.     Tenderness: There is no abdominal tenderness. There is no guarding or rebound.  Musculoskeletal:        General: No tenderness or edema.     Comments: Non pitting edema to bilateral lower extremities  Skin:    General: Skin is warm and dry.  Neurological:     Mental Status: She is alert and oriented to person, place, and time.  Psychiatric:        Mood and Affect: Mood and affect normal.        Behavior: Behavior normal.     Comments: Five out of five strength in all four extremities     ED Results / Procedures / Treatments   Labs (all labs ordered are listed, but only abnormal results are displayed) Labs Reviewed  COMPREHENSIVE METABOLIC PANEL - Abnormal; Notable for the following components:      Result Value   Sodium 118 (*)    Potassium 3.0 (*)    Chloride 83 (*)    Glucose, Bld 150 (*)    Calcium 7.6 (*)    All other components within normal limits  CBC WITH DIFFERENTIAL/PLATELET - Abnormal; Notable for the following components:   RBC 3.20 (*)    Hemoglobin 10.9 (*)    HCT 30.0 (*)    MCH 34.1 (*)    MCHC 36.3 (*)    Lymphs Abs 0.4 (*)    Monocytes Absolute 1.4 (*)    All other components within normal limits  PHENYTOIN LEVEL, TOTAL - Abnormal; Notable for the following components:   Phenytoin Lvl 5.2 (*)    All other components within normal limits  C DIFFICILE QUICK SCREEN W PCR REFLEX  SARS CORONAVIRUS 2 (TAT 6-24 HRS)   MAGNESIUM    EKG None  Radiology No results found.  Procedures Procedures   Medications Ordered in ED Medications  sodium chloride 0.9 % bolus 500 mL (500 mLs Intravenous New Bag/Given (Non-Interop) 07/21/20 1728)  potassium chloride SA (KLOR-CON) CR tablet 40 mEq (40 mEq Oral Given 07/21/20 1901)    ED Course  I have reviewed  the triage vital signs and the nursing notes.  Pertinent labs & imaging results that were available during my care of the patient were reviewed by me and considered in my medical decision making (see chart for details).    MDM Rules/Calculators/A&P                         patient presents the emergency department for evaluation of hyponatremia and generalized weakness in the setting of recent diarrhea illness. She has been receiving IV fluid hydration at her facility with worsening labs. On examination she is non-toxic appearing, has generalized weakness. Labs today significant for sodium of 118, decreased from 119 earlier today and 121 two days ago. She was treated with additional IV fluid hydration. Plan to admit for ongoing workup and treatment for hyponatremia.  Final Clinical Impression(s) / ED Diagnoses Final diagnoses:  Hyponatremia  Hypokalemia  Diarrhea, unspecified type    Rx / DC Orders ED Discharge Orders    None       Quintella Reichert, MD 07/21/20 2000

## 2020-07-21 NOTE — ED Notes (Signed)
Ralene Bathe, MD called and notified of critical result, sodium of 118

## 2020-07-21 NOTE — H&P (Signed)
History and Physical    [name] WUJ:[id number] DOB: 1936/03/24 DOA: 07/21/2020  PCP: Burnard Bunting, MD  Patient coming from: [facility]   Chief Complaint:  Chief Complaint  Patient presents with  . Abnormal Lab  . Weakness     HPI:    85 year old female with past medical history of hypertension, hyperlipidemia, pseudogout, lumbar spinal stenosis, complex partial epilepsy who presents to [facility] emergency department due to progressively worsening confusion and hyponatremia.  Patient is a poor historian and a good portion of the history is been obtained from the daughter who is at the bedside.  Approximately 2 weeks ago, the patient began to exhibit diarrhea.  Diarrhea was watery and nonbloody.  Patient is unable to quantify how many times diarrhea was occurring occasionally but explains that it was very frequent initially and has substantially improved in the past several days.  Patient denies any associated abdominal pain or fever.  Patient denies recent antibiotic use, recent travel, sick contacts or recent ingestion of undercooked food.  As the patient continued to experience diarrhea he began to exhibit bouts of lethargy and confusion.  Patient was evaluated by her primary care provider at her assisted living facility who identified that she was quite hyponatremic with a sodium level of 121 on 2/8.  At that time patient had an IV placed and was hydrated for approximately 24 hours with 80 cc of normal saline per hour.  Despite this intervention the patient continued to exhibit bouts of confusion and worsening hyponatremia.  Patient was then sent to [facility] emergency department for evaluation.  Upon evaluation in the emergency department patient was confirmed to have a sodium of 118.  Patient has been noted to be visibly confused in the emergency department.  Patient administered intravenous magnesium for hypomagnesemia and given  a 500 cc normal saline bolus by the emergency department.  The hospitalist group was then called to assess patient for admission the hospital.  Review of Systems:   Review of Systems  Unable to perform ROS: Mental status change    Past Medical History:  Diagnosis Date  . Abnormal cardiovascular stress test   . Carotid arterial disease (HCC)    MODERATE RIGHT  . Cerebral AV malformation   . DDD (degenerative disc disease)    SEVERE WITH SPINAL STENOSIS  . Hypercholesterolemia   . Hypertension   . MVP (mitral valve prolapse)    MILD  . Numbness    LEFT FOOT AND ANKLE  . Osteopenia   . Pseudo-gout     Past Surgical History:  Procedure Laterality Date  . APPENDECTOMY    . BREAST MASS EXCISION    . CARPAL TUNNEL RELEASE    . REMOVAL OF PARATHYROID GLAND    . STRESS NUCLEAR STUDY     ABNORMAL  . TONSILLECTOMY       reports that she quit smoking about 51 years ago. Her smoking use included cigarettes. She has a 14.00 pack-year smoking history. She has never used smokeless tobacco. She reports that she does not drink alcohol and does not use drugs.  Allergies  Allergen Reactions  . Azithromycin Other (See Comments)    unknown  . Naproxen Sodium     REACTION: rash  . Penicillins     REACTION: hives  . Pravastatin   . Sulfa Drugs Cross Reactors     Family History  Problem Relation Age of Onset  . Stroke Mother   . Lung cancer  Father   . Breast cancer Neg Hx      Prior to Admission medications   Medication Sig Start Date End Date Taking? Authorizing Provider  carvedilol (COREG) 25 MG tablet TAKE 1 TABLET BY MOUTH TWICE DAILY. Patient taking differently: Take 25 mg by mouth in the morning and at bedtime. 06/24/20  Yes [place], [name], MD  PHENobarbital (LUMINAL) 32.4 MG tablet Take 64.8 mg by mouth daily.   Yes [provider]  phenytoin (DILANTIN) 100 MG ER capsule Take 200 mg by mouth daily.   Yes [provider]  simvastatin (ZOCOR) 20 MG  tablet Take 20 mg by mouth at bedtime.   Yes [provider]  sodium chloride 0.9 % infusion Inject 2,000 mLs into the vein continuous. 07/20/20  Yes [provider]    Physical Exam: Vitals:   07/21/20 2100 07/21/20 2200 07/21/20 2230 07/21/20 2238  BP: (!) 136/48 (!) 132/37 (!) 135/40 107/79  Pulse: 74 71 72 70  Resp: 19 16 (!) 25 20  Temp:    98 F (36.7 C)  TempSrc:    Oral  SpO2: 98% 97% 98% 98%    Constitutional: Lethargic arousable, oriented x2., no associated distress.   Skin: no rashes, no lesions, notable poor skin turgor.   Eyes: Pupils are equally reactive to light.  No evidence of scleral icterus or conjunctival pallor.  ENMT: Dry mucous membranes noted.  Posterior pharynx clear of any exudate or lesions.   Neck: normal, supple, no masses, no thyromegaly.  No evidence of jugular venous distension.   Respiratory: clear to auscultation bilaterally, no wheezing, no crackles. Normal respiratory effort. No accessory muscle use.  Cardiovascular: Regular rate and rhythm, no murmurs / rubs / gallops. No extremity edema. 2+ pedal pulses. No carotid bruits.  Chest:   Nontender without crepitus or deformity.   Back:   Nontender without crepitus or deformity. Abdomen: Diffuse tenderness noted.  Abdomen is soft however.  .  No evidence of intra-abdominal masses.  Positive bowel sounds noted in all quadrants.   Musculoskeletal: No joint deformity upper and lower extremities. Good ROM, no contractures. Normal muscle tone.  Neurologic: Patient is lethargic but arousable.  Patient is oriented x2.  Patient is intermittently confused throughout the interview.   sensation is grossly intact.  Patient moving all 4 extremities spontaneously.  Patient is following all commands.  Patient is responsive to verbal stimuli.   Psychiatric: Patient exhibits normal mood with appropriate affect.  Patient does not seem to possess insight as to her current situation.  Labs on Admission: I  have personally reviewed following labs and imaging studies -   CBC: Recent Labs  Lab 07/19/20 0000 07/21/20 1745  WBC 6.4 8.9  NEUTROABS  --  7.1  HGB 9.6* 10.9*  HCT 27* 30.0*  MCV  --  93.8  PLT 181 454   Basic Metabolic Panel: Recent Labs  Lab 07/19/20 0000 07/21/20 1700 07/21/20 1745  NA 121*  --  118*  K 3.6  --  3.0*  CL 83*  --  83*  CO2 27*  --  24  GLUCOSE  --   --  150*  BUN 15  --  9  CREATININE  --   --  0.83  CALCIUM 8.1*  --  7.6*  MG  --  0.8*  --    GFR: Estimated Creatinine Clearance: 48.9 mL/min (by C-G formula based on SCr of 0.83 mg/dL). Liver Function Tests: Recent Labs  Lab 07/19/20  0000 07/21/20 1745  AST 30 31  ALT 14 16  ALKPHOS  --  56  BILITOT  --  1.0  PROT  --  6.6  ALBUMIN 3.6 3.5   No results for input(s): LIPASE, AMYLASE in the last 168 hours. No results for input(s): AMMONIA in the last 168 hours. Coagulation Profile: No results for input(s): INR, PROTIME in the last 168 hours. Cardiac Enzymes: No results for input(s): CKTOTAL, CKMB, CKMBINDEX, TROPONINI in the last 168 hours. BNP (last 3 results) No results for input(s): PROBNP in the last 8760 hours. HbA1C: No results for input(s): HGBA1C in the last 72 hours. CBG: No results for input(s): GLUCAP in the last 168 hours. Lipid Profile: No results for input(s): CHOL, HDL, LDLCALC, TRIG, CHOLHDL, LDLDIRECT in the last 72 hours. Thyroid Function Tests: No results for input(s): TSH, T4TOTAL, FREET4, T3FREE, THYROIDAB in the last 72 hours. Anemia Panel: No results for input(s): VITAMINB12, FOLATE, FERRITIN, TIBC, IRON, RETICCTPCT in the last 72 hours. Urine analysis: No results found for: COLORURINE, APPEARANCEUR, LABSPEC, PHURINE, GLUCOSEU, HGBUR, BILIRUBINUR, KETONESUR, PROTEINUR, UROBILINOGEN, NITRITE, LEUKOCYTESUR  Radiological Exams on Admission - Personally Reviewed: No results found.  EKG: Personally reviewed.  Rhythm is normal sinus rhythm with heart rate of 83  bpm.  No dynamic ST segment changes appreciated.  Assessment/Plan Principal Problem:   Hyponatremia  Patient exhibiting substantial hyponatremia on arrival of 118 point approximate 10-day history of watery diarrhea  Presentation is particularly concerning considering neurologic symptoms including confusion.  Patient also has known history of epilepsy bringing up concern for possible decrease in seizure threshold.  Small history suggests that this may be hypovolemic hyponatremia, obtaining serum osmolality, urine osmolality and urine sodium to help determine etiology  ER provider has already provided 500 cc bolus of normal saline.  Will obtain stat repeat chemistry.  If sodium is still well below 120 and patient is still exhibiting neurologic symptoms, will place in stepdown unit and initiate slow rate hypertonic saline.  If sodium levels have improved to 120 or higher will then placed in progressive unit on normal saline.  Monitoring sodium levels with with serial chemistries  Active Problems:   Acute metabolic encephalopathy   Patient exhibiting lethargy and bouts of confusion which has slowly worsened in the past several days  Felt to be secondary to volume depletion and hyponatremia  Working to correct hyponatremia, will monitor for symptomatic improvement  If encephalopathy fails to improve will expand work-up.    Acute diarrhea   Approximate 10-day history of diarrhea  No recent history of antibiotic use, recent travel, sick contacts with similar symptoms or recent ingestion of undercooked food.  Possibly a slowly resolving gastroenteritis  Severity diarrhea seems to be improving spontaneously without intervention.  Symptoms are likely self-limiting and will continue to monitor  We will correct any associated electrolyte abnormalities  Clear liquid diet for now until patient clinically improves    Mixed hyperlipidemia   Continue home regimen of statin therapy     History of complex partial epilepsy   Longstanding known history of complex partial epilepsy since patient's teenage years  Patient states that she has not had a seizure in many years  Substantial hyponatremia as above concern for decreasing seizure threshold  Continuing antiepileptic regimen    Hypomagnesemia   Replacing with intravenous magnesium sulfate  Monitoring magnesium levels with serial chemistries.    Hypokalemia due to excessive gastrointestinal loss of potassium   Replacing with oral potassium chloride  Correcting concurrent hypomagnesemia  as well  Monitoring potassium levels with serial chemistries.    Essential hypertension  Continue home regimen of Coreg    Code Status:  Full code Family Communication: Daughter is at bedside who has been updated on plan of care.  Status is: Inpatient  Remains inpatient appropriate because:Ongoing diagnostic testing needed not appropriate for outpatient work up, IV treatments appropriate due to intensity of illness or inability to take PO and Inpatient level of care appropriate due to severity of illness   Dispo: The patient is from: ALF              Anticipated d/c is to: ALF              Anticipated d/c date is: 3 days              Patient currently is not medically stable to d/c.   Difficult to place patient No        Vernelle Emerald MD [facility] Pager [phone]  If 7PM-7AM, please contact night-coverage [email or web address] Use universal [place] password for that web site. If you do not have the password, please call the hospital operator.  07/21/2020, 10:44 PM

## 2020-07-21 NOTE — ED Notes (Signed)
Report called to [facility]

## 2020-07-21 NOTE — ED Triage Notes (Signed)
Pt BIB EMS from [place]. Pt has had diarrhea and weakness for a week and blood work was done today. Sodium was 119. A&O x4.

## 2020-07-21 NOTE — ED Notes (Signed)
Date and time results received: 07/21/20 2006  Test: Magnesium Critical Value: 0.8  Name of Provider Notified: Ralene Bathe, MD  Orders Received? Or Actions Taken?: awaiting orders

## 2020-07-22 DIAGNOSIS — L8990 Pressure ulcer of unspecified site, unspecified stage: Secondary | ICD-10-CM | POA: Insufficient documentation

## 2020-07-22 DIAGNOSIS — E871 Hypo-osmolality and hyponatremia: Principal | ICD-10-CM

## 2020-07-22 LAB — CBC WITH DIFFERENTIAL/PLATELET
Abs Immature Granulocytes: 0.05 10*3/uL (ref 0.00–0.07)
Basophils Absolute: 0 10*3/uL (ref 0.0–0.1)
Basophils Relative: 0 %
Eosinophils Absolute: 0 10*3/uL (ref 0.0–0.5)
Eosinophils Relative: 0 %
HCT: 27 % — ABNORMAL LOW (ref 36.0–46.0)
Hemoglobin: 9.5 g/dL — ABNORMAL LOW (ref 12.0–15.0)
Immature Granulocytes: 1 %
Lymphocytes Relative: 11 %
Lymphs Abs: 0.8 10*3/uL (ref 0.7–4.0)
MCH: 34.2 pg — ABNORMAL HIGH (ref 26.0–34.0)
MCHC: 35.2 g/dL (ref 30.0–36.0)
MCV: 97.1 fL (ref 80.0–100.0)
Monocytes Absolute: 1.4 10*3/uL — ABNORMAL HIGH (ref 0.1–1.0)
Monocytes Relative: 18 %
Neutro Abs: 5.3 10*3/uL (ref 1.7–7.7)
Neutrophils Relative %: 70 %
Platelets: 160 10*3/uL (ref 150–400)
RBC: 2.78 MIL/uL — ABNORMAL LOW (ref 3.87–5.11)
RDW: 11.7 % (ref 11.5–15.5)
WBC: 7.5 10*3/uL (ref 4.0–10.5)
nRBC: 0 % (ref 0.0–0.2)

## 2020-07-22 LAB — MAGNESIUM: Magnesium: 1.6 mg/dL — ABNORMAL LOW (ref 1.7–2.4)

## 2020-07-22 LAB — PHOSPHORUS: Phosphorus: 2.3 mg/dL — ABNORMAL LOW (ref 2.5–4.6)

## 2020-07-22 LAB — BASIC METABOLIC PANEL
Anion gap: 10 (ref 5–15)
Anion gap: 8 (ref 5–15)
Anion gap: 10 (ref 5–15)
BUN: 7 mg/dL — ABNORMAL LOW (ref 8–23)
BUN: 7 mg/dL — ABNORMAL LOW (ref 8–23)
BUN: 7 mg/dL — ABNORMAL LOW (ref 8–23)
CO2: 24 mmol/L (ref 22–32)
CO2: 23 mmol/L (ref 22–32)
CO2: 22 mmol/L (ref 22–32)
Calcium: 7.4 mg/dL — ABNORMAL LOW (ref 8.9–10.3)
Calcium: 7.6 mg/dL — ABNORMAL LOW (ref 8.9–10.3)
Calcium: 7.5 mg/dL — ABNORMAL LOW (ref 8.9–10.3)
Chloride: 87 mmol/L — ABNORMAL LOW (ref 98–111)
Chloride: 90 mmol/L — ABNORMAL LOW (ref 98–111)
Chloride: 92 mmol/L — ABNORMAL LOW (ref 98–111)
Creatinine, Ser: 0.69 mg/dL (ref 0.44–1.00)
Creatinine, Ser: 0.71 mg/dL (ref 0.44–1.00)
Creatinine, Ser: 0.81 mg/dL (ref 0.44–1.00)
GFR, Estimated: >60 mL/min (ref >60)
GFR, Estimated: >60 mL/min (ref >60)
GFR, Estimated: >60 mL/min (ref >60)
Glucose, Bld: 108 mg/dL — ABNORMAL HIGH (ref 70–99)
Glucose, Bld: 124 mg/dL — ABNORMAL HIGH (ref 70–99)
Glucose, Bld: 173 mg/dL — ABNORMAL HIGH (ref 70–99)
Potassium: 3.6 mmol/L (ref 3.5–5.1)
Potassium: 3.7 mmol/L (ref 3.5–5.1)
Potassium: 3.5 mmol/L (ref 3.5–5.1)
Sodium: 121 mmol/L — ABNORMAL LOW (ref 135–145)
Sodium: 121 mmol/L — ABNORMAL LOW (ref 135–145)
Sodium: 124 mmol/L — ABNORMAL LOW (ref 135–145)

## 2020-07-22 LAB — C DIFFICILE QUICK SCREEN W PCR REFLEX
C Diff antigen: NEGATIVE
C Diff interpretation: NOT DETECTED
C Diff toxin: NEGATIVE

## 2020-07-22 LAB — OSMOLALITY: Osmolality: 252 mOsm/kg — ABNORMAL LOW (ref 275–295)

## 2020-07-22 LAB — SARS CORONAVIRUS 2 (TAT 6-24 HRS): SARS Coronavirus 2: NEGATIVE

## 2020-07-22 LAB — SODIUM, URINE, RANDOM: Sodium, Ur: 35 mmol/L

## 2020-07-22 LAB — OSMOLALITY, URINE: Osmolality, Ur: 213 mOsm/kg — ABNORMAL LOW (ref 300–900)

## 2020-07-22 MED ORDER — LOPERAMIDE HCL 2 MG PO CAPS: 2.0000 mg | ORAL_CAPSULE | Freq: Four times a day (QID) | ORAL | Status: DC | PRN

## 2020-07-22 MED ORDER — K PHOS MONO-SOD PHOS DI & MONO 155-852-130 MG PO TABS
500.0000 mg | ORAL_TABLET | Freq: Once | ORAL | Status: AC
  Administered 2020-07-22: 500 mg via ORAL
  Filled 2020-07-22: qty 2

## 2020-07-22 NOTE — Progress Notes (Signed)
Initial Nutrition Assessment  DOCUMENTATION CODES:   Not applicable  INTERVENTION:   Boost Breeze po TID, each supplement provides 250 kcal and 9 grams of protein  Encourage PO intake   NUTRITION DIAGNOSIS:   Inadequate oral intake related to decreased appetite as evidenced by meal completion < 50%.  GOAL:   Patient will meet greater than or equal to 90% of their needs  MONITOR:   PO intake,Supplement acceptance  REASON FOR ASSESSMENT:   Malnutrition Screening Tool    ASSESSMENT:   Pt from ALF with PMH of HTN, HLD and complex epilepsy admitted with hyponatremia, diarrhea, and worsening confusion.   Pt admitted overnight. Diet advanced to Regular this am.  Pt with prolonged diarrhea leading to acute hypovolemic hyponatremia and electrolyte imbalances.  Diet advanced however pt requesting clears.   Medications reviewed  Labs reviewed: Na 124, PO4: 2.3, Magnesium 1.6    Diet Order:   Diet Order            Diet regular Room service appropriate? Yes; Fluid consistency: Thin  Diet effective now                 EDUCATION NEEDS:   No education needs have been identified at this time  Skin:  Skin Assessment: Skin Integrity Issues: Skin Integrity Issues:: Stage II Stage II: coccyx  Last BM:  2/11 type 7  Height:   Ht Readings from Last 1 Encounters:  07/22/20 5\' 6"  (1.676 m)    Weight:   Wt Readings from Last 1 Encounters:  07/22/20 67.9 kg    Ideal Body Weight:  59 kg  BMI:  Body mass index is 24.16 kg/m.  Estimated Nutritional Needs:   Kcal:  1600-1800  Protein:  80-95 grams  Fluid:  >1.6 L/day  Lockie Pares., RD, LDN, CNSC See AMiON for contact information

## 2020-07-22 NOTE — Progress Notes (Signed)
PROGRESS NOTE  SAUMYA HUKILL  DOB: 1936/01/23  PCP: Burnard Bunting, MD GXQ:[id number]  DOA: 07/21/2020  LOS: 1 day   Chief Complaint  Patient presents with  . Abnormal Lab  . Weakness   Brief narrative: [name] is a 85 y.o. female with PMH significant for hypertension, hyperlipidemia, pseudogout, lumbar spinal stenosis, complex partial epilepsy who lives in an assisted living facility. Patient was brought to the ED on 2/10 with complaint of worsening confusion and hyponatremia. Weeks prior, patient had watery, nonbloody diarrhea without fever, no recent antibiotic use or travel.  Diarrhea gradually improved.  But lethargy and confusion lasted.   2/8, blood work done at ALF showed sodium level low at 121.  Patient was started on IV hydration at the facility despite which sodium level continues to trend down and patient continued to remain lethargic and hence patient was sent to ED.  In the ED, patient was confused, afebrile, heart rate in 80s, blood pressure 159/44, breathing on roomair Labs confirmed low sodium level at 118, low potassium at 3, low magnesium level at 0.8, serum osmolality low at 252, hemoglobin slightly low at 10.9 Patient was given IV fluid and IV replacements. Admitted to hospitalist service. Serial BMPs monitored  Subjective: Patient was seen and examined morning.  Pleasant elderly Caucasian female.  Cheerful. Sitting up in bed.  Not in distress. Chart reviewed Remains hemodynamically stable on room air Last set of blood work from this morning shows sodium level slightly improved to 121, potassium improved to 3.7  Assessment/Plan: Acute hypovolemic hyponatremia -Likely secondary to diarrhea loss, poor oral intake -Serum osmolality is low.  But urine osmolality also inappropriately low raising suspicion of DI. -Sodium level is gradually improving on IV fluid.  Continue normal saline at 100 mill per hour -Continue to monitor. Recent Labs  Lab  07/19/20 0000 07/21/20 1745 07/21/20 2102 07/22/20 0049 07/22/20 0804 07/22/20 1208  NA 121* 118* 119* 121* 121* 124*   Hypokalemia Hypomagnesemia Hypophosphatemia -Secondary to diarrhea loss and poor oral intake.  Replacements given.  Continue to monitor.  Obtain phosphorus level as well. Recent Labs  Lab 07/21/20 1700 07/21/20 1745 07/21/20 2102 07/22/20 0049 07/22/20 0804 07/22/20 1208  K  --  3.0* 3.1* 3.6 3.7 3.5  MG 0.8*  --   --   --  1.6*  --   PHOS  --   --   --   --  2.3*  --    Acute metabolic encephalopathy Progressive lethargy -Likely due to low electrolyte levels, low oral intake -Also has underlying risk factors for dementia  Prolonged diarrhea -10-day history of diarrhea without a clear etiology -Probably viral in nature -Continue to monitor.  Imodium as needed -Currently on clear liquid diet.  If no nausea vomiting, no restriction to regular diet.  Patient tells me that she wants to stay on clear liquid diet today.  Essential hypertension -Continue Coreg 25 mg twice daily -Continue to monitor blood pressure  Mixed hyperlipidemia -Continue simvastatin.  h/o complex partial epilepsy -Continue antiseizure medications: Phenobarbital and phenytoin daily  Mobility: Encourage ambulation.  PT eval ordered. Code Status:   Code Status: DNR  Nutritional status: Body mass index is 24.16 kg/m.     Diet Order            Diet regular Room service appropriate? Yes; Fluid consistency: Thin  Diet effective now                 DVT prophylaxis: enoxaparin (  LOVENOX) injection 40 mg Start: 07/21/20 2245   Antimicrobials:  None Fluid: Normal saline at 100 mL/h Consultants: None Family Communication:  None at bedside  Status is: Inpatient  Remains inpatient appropriate because: Continues to need IV fluid.  Dispo: The patient is from: Home              Anticipated d/c is to: Home versus SNF, pending PT eval              Anticipated d/c date is: 2  days              Patient currently is not medically stable to d/c.   Difficult to place patient No       Infusions:  . sodium chloride 100 mL/hr at 07/22/20 0944    Scheduled Meds: . carvedilol  25 mg Oral BID  . Chlorhexidine Gluconate Cloth  6 each Topical Daily  . enoxaparin (LOVENOX) injection  40 mg Subcutaneous Q24H  . feeding supplement  1 Container Oral TID BM  . PHENobarbital  64.8 mg Oral Daily  . phenytoin  200 mg Oral Daily  . simvastatin  20 mg Oral QHS    Antimicrobials: Anti-infectives (From admission, onward)   None      PRN meds: acetaminophen **OR** acetaminophen, ondansetron **OR** ondansetron (ZOFRAN) IV   Objective: Vitals:   07/22/20 1200 07/22/20 1237  BP: (!) 110/56   Pulse: 75   Resp: (!) 22   Temp:  99.4 F (37.4 C)  SpO2: 97%     Intake/Output Summary (Last 24 hours) at 07/22/2020 1429 Last data filed at 07/22/2020 0600 Gross per 24 hour  Intake 973.55 ml  Output 100 ml  Net 873.55 ml   Filed Weights   07/22/20 0000  Weight: 67.9 kg   Weight change:  Body mass index is 24.16 kg/m.   Physical Exam: General exam: Pleasant, not in physical distress Skin: No rashes, lesions or ulcers. HEENT: Atraumatic, normocephalic, no obvious bleeding Lungs: Clear to auscultation bilaterally CVS: Regular rate and rhythm, no murmur GI/Abd soft, nontender, nondistended, abdomen CNS: Alert, awake, oriented to place and person Psychiatry: Mood appropriate Extremities: No pedal edema, no calf tenderness  Data Review: I have personally reviewed the laboratory data and studies available.  Recent Labs  Lab 07/19/20 0000 07/21/20 1745 07/22/20 0049  WBC 6.4 8.9 7.5  NEUTROABS  --  7.1 5.3  HGB 9.6* 10.9* 9.5*  HCT 27* 30.0* 27.0*  MCV  --  93.8 97.1  PLT 181 197 160   Recent Labs  Lab 07/21/20 1700 07/21/20 1745 07/21/20 2102 07/22/20 0049 07/22/20 0804 07/22/20 1208  NA  --  118* 119* 121* 121* 124*  K  --  3.0* 3.1* 3.6 3.7  3.5  CL  --  83* 85* 87* 90* 92*  CO2  --  24 23 24 23 22   GLUCOSE  --  150* 117* 108* 124* 173*  BUN  --  9 8 7* 7* 7*  CREATININE  --  0.83 0.72 0.69 0.71 0.81  CALCIUM  --  7.6* 7.3* 7.4* 7.6* 7.5*  MG 0.8*  --   --   --  1.6*  --   PHOS  --   --   --   --  2.3*  --     F/u labs ordered  Signed, Terrilee Croak, MD [facility] 07/22/2020

## 2020-07-23 LAB — CBC WITH DIFFERENTIAL/PLATELET
Abs Immature Granulocytes: 0.06 10*3/uL (ref 0.00–0.07)
Basophils Absolute: 0 10*3/uL (ref 0.0–0.1)
Basophils Relative: 0 %
Eosinophils Absolute: 0 10*3/uL (ref 0.0–0.5)
Eosinophils Relative: 0 %
HCT: 26 % — ABNORMAL LOW (ref 36.0–46.0)
Hemoglobin: 9.2 g/dL — ABNORMAL LOW (ref 12.0–15.0)
Immature Granulocytes: 1 %
Lymphocytes Relative: 5 %
Lymphs Abs: 0.5 10*3/uL — ABNORMAL LOW (ref 0.7–4.0)
MCH: 34.2 pg — ABNORMAL HIGH (ref 26.0–34.0)
MCHC: 35.4 g/dL (ref 30.0–36.0)
MCV: 96.7 fL (ref 80.0–100.0)
Monocytes Absolute: 1.5 10*3/uL — ABNORMAL HIGH (ref 0.1–1.0)
Monocytes Relative: 15 %
Neutro Abs: 8.2 10*3/uL — ABNORMAL HIGH (ref 1.7–7.7)
Neutrophils Relative %: 79 %
Platelets: 165 10*3/uL (ref 150–400)
RBC: 2.69 MIL/uL — ABNORMAL LOW (ref 3.87–5.11)
RDW: 12.3 % (ref 11.5–15.5)
WBC: 10.3 10*3/uL (ref 4.0–10.5)
nRBC: 0 % (ref 0.0–0.2)

## 2020-07-23 LAB — BASIC METABOLIC PANEL
Anion gap: 9 (ref 5–15)
BUN: 8 mg/dL (ref 8–23)
CO2: 23 mmol/L (ref 22–32)
Calcium: 7.6 mg/dL — ABNORMAL LOW (ref 8.9–10.3)
Chloride: 95 mmol/L — ABNORMAL LOW (ref 98–111)
Creatinine, Ser: 0.85 mg/dL (ref 0.44–1.00)
GFR, Estimated: >60 mL/min (ref >60)
Glucose, Bld: 116 mg/dL — ABNORMAL HIGH (ref 70–99)
Potassium: 3.1 mmol/L — ABNORMAL LOW (ref 3.5–5.1)
Sodium: 127 mmol/L — ABNORMAL LOW (ref 135–145)

## 2020-07-23 LAB — MAGNESIUM: Magnesium: 1.4 mg/dL — ABNORMAL LOW (ref 1.7–2.4)

## 2020-07-23 LAB — PHOSPHORUS: Phosphorus: 2.6 mg/dL (ref 2.5–4.6)

## 2020-07-23 MED ORDER — MAGNESIUM SULFATE 4 GM/100ML IV SOLN
4.0000 g | Freq: Once | INTRAVENOUS | Status: AC
  Administered 2020-07-23: 4 g via INTRAVENOUS
  Filled 2020-07-23: qty 100

## 2020-07-23 MED ORDER — POTASSIUM CHLORIDE CRYS ER 20 MEQ PO TBCR
40.0000 meq | EXTENDED_RELEASE_TABLET | ORAL | Status: AC
  Administered 2020-07-23 (×2): 40 meq via ORAL
  Filled 2020-07-23 (×2): qty 2

## 2020-07-23 MED ORDER — SODIUM CHLORIDE 1 G PO TABS
1.0000 g | ORAL_TABLET | Freq: Three times a day (TID) | ORAL | Status: DC
  Administered 2020-07-23 – 2020-07-25 (×6): 1 g via ORAL
  Filled 2020-07-23 (×6): qty 1

## 2020-07-23 NOTE — Progress Notes (Signed)
PROGRESS NOTE  [name]  DOB: 07/19/1935  PCP: Burnard Bunting, MD AYT:[id number]  DOA: 07/21/2020  LOS: 2 days   Chief Complaint  Patient presents with  . Abnormal Lab  . Weakness   Brief narrative: [name] is a 85 y.o. female with PMH significant for hypertension, hyperlipidemia, pseudogout, lumbar spinal stenosis, complex partial epilepsy who lives in an assisted living facility. Patient was brought to the ED on 2/10 with complaint of worsening confusion and hyponatremia. 2 weeks prior, patient had watery, nonbloody diarrhea without fever, no recent antibiotic use or travel.  Diarrhea gradually improved.  But lethargy and confusion lasted.   2/8, blood work done at ALF showed sodium level low at 121.  Patient was started on IV hydration at the facility despite which sodium level continues to trend down and patient continued to remain lethargic and hence patient was sent to ED.  In the ED, patient was confused, afebrile, heart rate in 80s, blood pressure 159/44, breathing on roomair Labs confirmed low sodium level at 118, low potassium at 3, low magnesium level at 0.8, serum osmolality low at 252, hemoglobin slightly low at 10.9 Patient was given IV fluid and IV replacements. Admitted to hospitalist service. Serial BMPs monitored See below for details.  Subjective: Patient was seen and examined morning.  Pleasant elderly Caucasian female.  Cheerful. Sitting up in bed.  Not in distress. Son Mr. Milta Deiters at bedside. Patient feels better.  She is worried about impaired mobility.  Because of that, she is concerned about unable to control her bowel and bladder and hence not eating enough. Labs this morning show improving sodium level but low potassium magnesium level.  Assessment/Plan: Acute hypovolemic hyponatremia -Likely secondary to diarrhea loss, poor oral intake -Serum osmolality is low.  But urine osmolality also inappropriately low raising suspicion of  DI. -Sodium level is gradually improving on normal saline at 100 mill per hour.  She is still having some watery stools so I would continue IV fluid but at a reduced rate of 75 mill per hour to minimize the risk of fluid overload.  I will also add sodium chloride tablet for next few days. -Continue to monitor. Recent Labs  Lab 07/19/20 0000 07/21/20 1745 07/21/20 2102 07/22/20 0049 07/22/20 0804 07/22/20 1208 07/23/20 0442  NA 121* 118* 119* 121* 121* 124* 127*   Hypokalemia Hypomagnesemia Hypophosphatemia -Secondary to diarrhea loss and poor oral intake.  -Labs this morning with potassium low at 3.1 and magnesium low at 1.4.  Replacements ordered.  Phosphorus in normal range at 2.6  Recent Labs  Lab 07/21/20 1700 07/21/20 1745 07/21/20 2102 07/22/20 0049 07/22/20 0804 07/22/20 1208 07/23/20 0442  K  --    < > 3.1* 3.6 3.7 3.5 3.1*  MG 0.8*  --   --   --  1.6*  --  1.4*  PHOS  --   --   --   --  2.3*  --  2.6   < > = values in this interval not displayed.   Acute metabolic encephalopathy Progressive lethargy -Likely due to low electrolyte levels, low oral intake -Also has underlying risk factors for dementia -Mental status intact at this time.  Prolonged diarrhea -10-day history of diarrhea without a clear etiology -Probably viral in nature.  Frequency reduced but consistently still watery. -Continue to monitor.  Imodium as needed -Regular diet encouraged.  Chronic anemia -EGD/colonoscopy several years ago.  Reportedly normal. -Hemoglobin chronically between 9-10.  Obtain anemia panel Recent  Labs    07/19/20 0000 07/21/20 1745 07/21/20 1745 07/22/20 0049 07/23/20 0442  HGB 9.6* 10.9*  --  9.5* 9.2*  MCV  --  93.8   < > 97.1 96.7   < > = values in this interval not displayed.   Essential hypertension -Continue Coreg 25 mg twice daily -Continue to monitor blood pressure  Mixed hyperlipidemia -Continue simvastatin.  h/o complex partial  epilepsy -Continue antiseizure medications: Phenobarbital and phenytoin daily  Mobility: Encourage ambulation.  PT eval pending Code Status:   Code Status: DNR  Nutritional status: Body mass index is 24.16 kg/m. Nutrition Problem: Inadequate oral intake Etiology: decreased appetite Signs/Symptoms: meal completion < 50% Diet Order            Diet regular Room service appropriate? Yes; Fluid consistency: Thin  Diet effective now                 DVT prophylaxis: enoxaparin (LOVENOX) injection 40 mg Start: 07/21/20 2245   Antimicrobials:  None Fluid: Normal saline at 75 mill per hour Consultants: None Family Communication:  Son at bedside  Status is: Inpatient  Remains inpatient appropriate because: Continues to need IV fluid.  Dispo: The patient is from: Home              Anticipated d/c is to: Likely back to ALF at [facility] in 1 to 2 days              Anticipated d/c date is: 2 days              Patient currently is not medically stable to d/c.   Difficult to place patient No       Infusions:  . sodium chloride 100 mL/hr at 07/23/20 0532  . magnesium sulfate bolus IVPB      Scheduled Meds: . carvedilol  25 mg Oral BID  . Chlorhexidine Gluconate Cloth  6 each Topical Daily  . enoxaparin (LOVENOX) injection  40 mg Subcutaneous Q24H  . feeding supplement  1 Container Oral TID BM  . PHENobarbital  64.8 mg Oral Daily  . phenytoin  200 mg Oral Daily  . potassium chloride  40 mEq Oral Q2H  . simvastatin  20 mg Oral QHS  . sodium chloride  1 g Oral TID WC    Antimicrobials: Anti-infectives (From admission, onward)   None      PRN meds: acetaminophen **OR** acetaminophen, loperamide, ondansetron **OR** ondansetron (ZOFRAN) IV   Objective: Vitals:   07/23/20 0035 07/23/20 0508  BP: (!) 127/58 120/64  Pulse: 87 90  Resp: 16 16  Temp: 98.4 F (36.9 C) 98.8 F (37.1 C)  SpO2: 98% 96%    Intake/Output Summary (Last 24 hours) at 07/23/2020 1036 Last  data filed at 07/22/2020 1420 Gross per 24 hour  Intake -  Output 150 ml  Net -150 ml   Filed Weights   07/22/20 0000  Weight: 67.9 kg   Weight change:  Body mass index is 24.16 kg/m.   Physical Exam: General exam: Pleasant, elderly.  Not in physical distress Skin: No rashes, lesions or ulcers. HEENT: Atraumatic, normocephalic, no obvious bleeding Lungs: Clear to auscultation bilaterally CVS: Regular rate and rhythm, no murmur GI/Abd soft, nontender, nondistended, abdomen CNS: Alert, awake, oriented to place and person Psychiatry: Mood appropriate Extremities: No pedal edema, no calf tenderness  Data Review: I have personally reviewed the laboratory data and studies available.  Recent Labs  Lab 07/19/20 0000 07/21/20 1745 07/22/20 0049 07/23/20 8413  WBC 6.4 8.9 7.5 10.3  NEUTROABS  --  7.1 5.3 8.2*  HGB 9.6* 10.9* 9.5* 9.2*  HCT 27* 30.0* 27.0* 26.0*  MCV  --  93.8 97.1 96.7  PLT 181 197 160 165   Recent Labs  Lab 07/21/20 1700 07/21/20 1745 07/21/20 2102 07/22/20 0049 07/22/20 0804 07/22/20 1208 07/23/20 0442  NA  --    < > 119* 121* 121* 124* 127*  K  --    < > 3.1* 3.6 3.7 3.5 3.1*  CL  --    < > 85* 87* 90* 92* 95*  CO2  --    < > 23 24 23 22 23   GLUCOSE  --    < > 117* 108* 124* 173* 116*  BUN  --    < > 8 7* 7* 7* 8  CREATININE  --    < > 0.72 0.69 0.71 0.81 0.85  CALCIUM  --    < > 7.3* 7.4* 7.6* 7.5* 7.6*  MG 0.8*  --   --   --  1.6*  --  1.4*  PHOS  --   --   --   --  2.3*  --  2.6   < > = values in this interval not displayed.    F/u labs ordered  Signed, Terrilee Croak, MD [facility] 07/23/2020

## 2020-07-23 NOTE — Evaluation (Signed)
Physical Therapy Evaluation Patient Details Name: [name] MRN: [id number] DOB: [birth year] Today's Date: 07/23/2020   History of Present Illness  Pt is a 85 y.o. female with PMH significant for hypertension, hyperlipidemia, pseudogout, lumbar spinal stenosis, complex partial epilepsy who lives in an assisted living facility.  Patient was brought to the ED on 2/10 with complaint of worsening confusion and hyponatremia. Pt admitted with hyponatremia, hypokalemia, acute metabolic encephalopathy.  Clinical Impression  Pt admitted with above diagnosis. Pt is normally independent with Rollator and able to drive and ambulate in community.  She does reside at [facility].  Today, pt requiring mod A for transfers and ambulated 5' with RW and mod A.  Pt with tendency to posterior lean requiring assist for balance.  She is a fall risk.  Pt additionally with mild cognitive deficits at this time compared to baseline.  She has good rehab potential. Pt currently with functional limitations due to the deficits listed below (see PT Problem List). Pt will benefit from skilled PT to increase their independence and safety with mobility to allow discharge to the venue listed below.       Follow Up Recommendations SNF    Equipment Recommendations  3in1 (PT)    Recommendations for Other Services       Precautions / Restrictions Precautions Precautions: Fall      Mobility  Bed Mobility Overal bed mobility: Needs Assistance Bed Mobility: Supine to Sit     Supine to sit: Mod assist;HOB elevated     General bed mobility comments: Increased time, mod A to lift trunk and scoot to EOB with use of bed pad    Transfers Overall transfer level: Needs assistance Equipment used: Rolling walker (2 wheeled) Transfers: Sit to/from Stand Sit to Stand: Mod assist         General transfer comment: Mod A to rise and steady initially with sit to stand.  Performed x 3.  Max cues for hand placement to  rise and for safe sitting technique (backing all the way to seat)  Ambulation/Gait Ambulation/Gait assistance: Mod assist Gait Distance (Feet): 3 Feet (3' then 5') Assistive device: Rolling walker (2 wheeled) Gait Pattern/deviations: Step-to pattern;Decreased stride length;Shuffle Gait velocity: decreased   General Gait Details: Steps to [facility] then from [facility] to recliner with assist for RW and min-mod A for balance; cues for posture, RW, adn safety  Stairs            Wheelchair Mobility    Modified Rankin (Stroke Patients Only)       Balance Overall balance assessment: Needs assistance Sitting-balance support: No upper extremity supported Sitting balance-Leahy Scale: Fair Sitting balance - Comments: Did not challenge but could maintain static sitting   Standing balance support: Bilateral upper extremity supported;During functional activity Standing balance-Leahy Scale: Poor Standing balance comment: Requiring min - mod A for balance with tendency to posterior lean                             Pertinent Vitals/Pain Pain Assessment: Faces Faces Pain Scale: Hurts a little bit Pain Location: L ankle with movement Pain Descriptors / Indicators: Tightness Pain Intervention(s): Limited activity within patient's tolerance;Monitored during session    Home Living Family/patient expects to be discharged to:: Skilled nursing facility               Home Equipment: Gilford Rile - 4 wheels;Walker - 2 wheels;Grab bars - tub/shower Additional Comments: Pt  lives at [facility]    Prior Function           Comments: Pt was getting PT for neuropathy/balance at facility.  Prior to this admission pt was completely independent with rollator.  She could drive and ambulate community distances.     Hand Dominance        Extremity/Trunk Assessment   Upper Extremity Assessment Upper Extremity Assessment: LUE deficits/detail;RUE deficits/detail RUE Deficits / Details:  ROM - shoulder very limited/baseline only ~20 degrees elevation; otherwise WFL with generalized weakness LUE Deficits / Details: ROM shoulder limited to ~90degrees; otherwise WFL with generalized weakness    Lower Extremity Assessment Lower Extremity Assessment: LLE deficits/detail;RLE deficits/detail RLE Deficits / Details: +edema, ROM WFL, MMT 4/5, reports some mild pain in ankle with movement (chronic neuropathy) RLE Sensation: history of peripheral neuropathy LLE Deficits / Details: +edema (L>R), ROM WFL, MMT 4/5, reports some mild pain in ankle with movement (chronic neuropathy) LLE Sensation: history of peripheral neuropathy    Cervical / Trunk Assessment Cervical / Trunk Assessment: Kyphotic  Communication   Communication: No difficulties  Cognition Arousal/Alertness: Awake/alert Behavior During Therapy: WFL for tasks assessed/performed Overall Cognitive Status: Impaired/Different from baseline Area of Impairment: Orientation;Memory;Problem solving                 Orientation Level: Time (pt states she has been in hospital for a week (only 2 days))   Memory: Decreased short-term memory (repeated self at times)       Problem Solving: Slow processing;Requires verbal cues;Requires tactile cues General Comments: Some minimal cues from son in regards to PLOF      General Comments General comments (skin integrity, edema, etc.): REquired max assist with toileting ADLs    Exercises General Exercises - Lower Extremity Ankle Circles/Pumps: AROM;15 reps;Both   Assessment/Plan    PT Assessment Patient needs continued PT services  PT Problem List Decreased strength;Decreased mobility;Decreased safety awareness;Decreased range of motion;Decreased activity tolerance;Decreased cognition;Decreased balance;Decreased knowledge of use of DME;Pain;Impaired sensation       PT Treatment Interventions DME instruction;Therapeutic activities;Gait training;Therapeutic  exercise;Patient/family education;Modalities;Stair training;Balance training;Functional mobility training;Neuromuscular re-education    PT Goals (Current goals can be found in the Care Plan section)  Acute Rehab PT Goals Patient Stated Goal: Short term rehab at [facility] then back to ALF PT Goal Formulation: With patient/family Time For Goal Achievement: 08/06/20 Potential to Achieve Goals: Good    Frequency Min 2X/week   Barriers to discharge        Co-evaluation               AM-PAC PT "6 Clicks" Mobility  Outcome Measure Help needed turning from your back to your side while in a flat bed without using bedrails?: A Little Help needed moving from lying on your back to sitting on the side of a flat bed without using bedrails?: A Lot Help needed moving to and from a bed to a chair (including a wheelchair)?: A Lot Help needed standing up from a chair using your arms (e.g., wheelchair or bedside chair)?: A Lot Help needed to walk in hospital room?: A Lot Help needed climbing 3-5 steps with a railing? : A Lot 6 Click Score: 13    End of Session Equipment Utilized During Treatment: Gait belt Activity Tolerance: Patient tolerated treatment well Patient left: in chair;with family/visitor present;with call bell/phone within reach (son reports he is not leaving) Nurse Communication: Mobility status (had BM and urinated in bsc) PT Visit Diagnosis:  Unsteadiness on feet (R26.81);Muscle weakness (generalized) (M62.81)    Time: 0092-3300 PT Time Calculation (min) (ACUTE ONLY): 40 min   Charges:   PT Evaluation $PT Eval Low Complexity: 1 Low PT Treatments $Therapeutic Activity: 23-37 mins        Abran Richard, PT Acute Rehab Services Pager [phone] [facility] [phone]    Karlton Lemon 07/23/2020, 11:00 AM

## 2020-07-24 LAB — CBC WITH DIFFERENTIAL/PLATELET
Abs Immature Granulocytes: 0.03 10*3/uL (ref 0.00–0.07)
Basophils Absolute: 0 10*3/uL (ref 0.0–0.1)
Basophils Relative: 0 %
Eosinophils Absolute: 0 10*3/uL (ref 0.0–0.5)
Eosinophils Relative: 0 %
HCT: 24.2 % — ABNORMAL LOW (ref 36.0–46.0)
Hemoglobin: 8.3 g/dL — ABNORMAL LOW (ref 12.0–15.0)
Immature Granulocytes: 0 %
Lymphocytes Relative: 7 %
Lymphs Abs: 0.6 10*3/uL — ABNORMAL LOW (ref 0.7–4.0)
MCH: 33.9 pg (ref 26.0–34.0)
MCHC: 34.3 g/dL (ref 30.0–36.0)
MCV: 98.8 fL (ref 80.0–100.0)
Monocytes Absolute: 1.2 10*3/uL — ABNORMAL HIGH (ref 0.1–1.0)
Monocytes Relative: 14 %
Neutro Abs: 6.5 10*3/uL (ref 1.7–7.7)
Neutrophils Relative %: 79 %
Platelets: 154 10*3/uL (ref 150–400)
RBC: 2.45 MIL/uL — ABNORMAL LOW (ref 3.87–5.11)
RDW: 12.7 % (ref 11.5–15.5)
WBC: 8.4 10*3/uL (ref 4.0–10.5)
nRBC: 0 % (ref 0.0–0.2)

## 2020-07-24 LAB — FOLATE: Folate: 13.5 ng/mL (ref >5.9)

## 2020-07-24 LAB — RETICULOCYTES
Immature Retic Fract: 5 % (ref 2.3–15.9)
RBC.: 2.46 MIL/uL — ABNORMAL LOW (ref 3.87–5.11)
Retic Count, Absolute: 27.3 10*3/uL (ref 19.0–186.0)
Retic Ct Pct: 1.1 % (ref 0.4–3.1)

## 2020-07-24 LAB — IRON AND TIBC
Iron: 9 ug/dL — ABNORMAL LOW (ref 28–170)
Saturation Ratios: 7 % — ABNORMAL LOW (ref 10.4–31.8)
TIBC: 122 ug/dL — ABNORMAL LOW (ref 250–450)
UIBC: 113 ug/dL

## 2020-07-24 LAB — FERRITIN: Ferritin: 236 ng/mL (ref 11–307)

## 2020-07-24 LAB — BASIC METABOLIC PANEL
Anion gap: 8 (ref 5–15)
BUN: 14 mg/dL (ref 8–23)
CO2: 21 mmol/L — ABNORMAL LOW (ref 22–32)
Calcium: 7.7 mg/dL — ABNORMAL LOW (ref 8.9–10.3)
Chloride: 98 mmol/L (ref 98–111)
Creatinine, Ser: 0.95 mg/dL (ref 0.44–1.00)
GFR, Estimated: 60 mL/min — ABNORMAL LOW (ref >60)
Glucose, Bld: 115 mg/dL — ABNORMAL HIGH (ref 70–99)
Potassium: 3.8 mmol/L (ref 3.5–5.1)
Sodium: 127 mmol/L — ABNORMAL LOW (ref 135–145)

## 2020-07-24 LAB — VITAMIN B12: Vitamin B-12: 1311 pg/mL — ABNORMAL HIGH (ref 180–914)

## 2020-07-24 MED ORDER — FERROUS SULFATE 325 (65 FE) MG PO TABS
325.0000 mg | ORAL_TABLET | Freq: Two times a day (BID) | ORAL | Status: DC
  Administered 2020-07-24 – 2020-07-25 (×3): 325 mg via ORAL
  Filled 2020-07-24 (×3): qty 1

## 2020-07-24 NOTE — Evaluation (Signed)
Occupational Therapy Evaluation Patient Details Name: [name] MRN: [id number] DOB: July 25, 1935 Today's Date: 07/24/2020    History of Present Illness Pt is a 85 y.o. female with PMH significant for hypertension, hyperlipidemia, pseudogout, lumbar spinal stenosis, complex partial epilepsy who lives in an assisted living facility.  Patient was brought to the ED on 2/10 with complaint of worsening confusion and hyponatremia. Pt admitted with hyponatremia, hypokalemia, acute metabolic encephalopathy.   Clinical Impression   [name] is a pleasant 85 year old woman typically independent with ADLs, drives and ambulates in the community with a RW. On evaluation patient presents with generalized weakness, decreased activity tolerance and impaired balance. Patient reports frequent diarrhea and overall malaise. Patient required max assist for transfer to side of bed, legs appear edematous, mod assist for standing and min assist with RW to take steps to [facility] and then to recliner. Patient requiring significant assistance for toileting and LB ADLs and set up and seated position for UB ADLs. Patient will benefit from skilled OT services while in hospital to improve deficits and learn compensatory strategies as needed in order to return PLOF.  Patient instructed and educated to get out of bed daily, use BSC when able and perform upper and lower extremity movement to maintain strength and ROM and reduce stiffness. Recommend short term rehab at discharge.    Follow Up Recommendations  SNF    Equipment Recommendations  None recommended by OT    Recommendations for Other Services       Precautions / Restrictions Precautions Precautions: Fall Restrictions Weight Bearing Restrictions: No      Mobility Bed Mobility Overal bed mobility: Needs Assistance Bed Mobility: Supine to Sit     Supine to sit: Max assist;HOB elevated     General bed mobility comments: Max assist for assistance  with LEs, trunk and bed pad to pivot. Difficulty assisting with scooting forward due to softness of bed.    Transfers Overall transfer level: Needs assistance Equipment used: Rolling walker (2 wheeled) Transfers: Sit to/from [facility] Sit to Stand: From elevated surface;Mod assist Stand pivot transfers: Min assist       General transfer comment: Mod assist to rise. Verbal cues for hand placement and use of arms on walker. Min assist to side step to [facility]. After toileting - min assist to take steps to recliner.    Balance Overall balance assessment: Needs assistance Sitting-balance support: No upper extremity supported;Feet supported Sitting balance-Leahy Scale: Fair Sitting balance - Comments: Did not challenge but could maintain static sitting   Standing balance support: Bilateral upper extremity supported;During functional activity Standing balance-Leahy Scale: Poor Standing balance comment: reliance on walker and min assist from therapist                           ADL either performed or assessed with clinical judgement   ADL Overall ADL's : Needs assistance/impaired Eating/Feeding: Set up;Sitting   Grooming: Set up;Sitting   Upper Body Bathing: Set up;Sitting   Lower Body Bathing: Maximal assistance;Set up;Sitting/lateral leans;Sit to/from stand   Upper Body Dressing : Set up;Sitting   Lower Body Dressing: Total assistance;Sit to/from stand   Toilet Transfer: Moderate assistance;BSC;Stand-pivot;RW Toilet Transfer Details (indicate cue type and reason): Mod assist to rise from [place] and Hygiene: Sit to/from stand;Maximal assistance Toileting - Clothing Manipulation Details (indicate cue type and reason): Can wipe in seated position but needs assistance for clothing and buttocks.  Functional mobility during ADLs: Minimal assistance;Moderate assistance General ADL Comments: Mod assist to rise, min assist to  take steps     Vision Patient Visual Report: No change from baseline       Perception     Praxis      Pertinent Vitals/Pain Pain Assessment: No/denies pain     Hand Dominance Right   Extremity/Trunk Assessment Upper Extremity Assessment Upper Extremity Assessment: Generalized weakness RUE Deficits / Details: ROM - shoulder very limited/baseline only ~20 degrees elevation; otherwise WFL with generalized weakness LUE Deficits / Details: ROM shoulder limited to ~90degrees; otherwise WFL with generalized weakness   Lower Extremity Assessment Lower Extremity Assessment: Defer to PT evaluation   Cervical / Trunk Assessment Cervical / Trunk Assessment: Kyphotic   Communication Communication Communication: No difficulties   Cognition Arousal/Alertness: Awake/alert Behavior During Therapy: WFL for tasks assessed/performed Overall Cognitive Status: Within Functional Limits for tasks assessed                                     General Comments       Exercises     Shoulder Instructions      Home Living Family/patient expects to be discharged to:: Skilled nursing facility                             Home Equipment: Walker - 4 wheels;Walker - 2 wheels;Grab bars - tub/shower   Additional Comments: Pt lives at [facility]      Prior Functioning/Environment          Comments: Pt was getting PT for neuropathy/balance at facility.  Prior to this admission pt was completely independent with rollator.  She could drive and ambulate community distances.        OT Problem List: Decreased strength;Decreased activity tolerance;Impaired balance (sitting and/or standing);Decreased knowledge of use of DME or AE;Obesity;Increased edema      OT Treatment/Interventions: Self-care/ADL training;Therapeutic exercise;DME and/or AE instruction;Therapeutic activities;Balance training;Patient/family education    OT Goals(Current goals can be found in the care  plan section) Acute Rehab OT Goals Patient Stated Goal: walk to the bathroom OT Goal Formulation: With patient Time For Goal Achievement: 08/07/20 Potential to Achieve Goals: Good  OT Frequency: Min 2X/week   Barriers to D/C:            Co-evaluation              AM-PAC OT "6 Clicks" Daily Activity     Outcome Measure Help from another person eating meals?: A Little Help from another person taking care of personal grooming?: A Little Help from another person toileting, which includes using toliet, bedpan, or urinal?: A Lot Help from another person bathing (including washing, rinsing, drying)?: A Lot Help from another person to put on and taking off regular upper body clothing?: A Little Help from another person to put on and taking off regular lower body clothing?: Total 6 Click Score: 14   End of Session Equipment Utilized During Treatment: Rolling walker Nurse Communication: Mobility status  Activity Tolerance: Patient limited by fatigue Patient left: in chair;with call bell/phone within reach;with family/visitor present  OT Visit Diagnosis: Unsteadiness on feet (R26.81);Muscle weakness (generalized) (M62.81)                Time: 1443-1540 OT Time Calculation (min): 30 min Charges:  OT General Charges $OT Visit: 1  Visit OT Evaluation $OT Eval Moderate Complexity: 1 Mod OT Treatments $Self Care/Home Management : 8-22 mins  Masiya Claassen, OTR/L [place]  Office [phone] Pager: [phone]   Lenward Chancellor 07/24/2020, 1:14 PM

## 2020-07-24 NOTE — Progress Notes (Signed)
PROGRESS NOTE  [name]  DOB: 05/27/1936  PCP: Burnard Bunting, MD MPN:[id number]  DOA: 07/21/2020  LOS: 3 days   Chief Complaint  Patient presents with  . Abnormal Lab  . Weakness   Brief narrative: [name] is a 85 y.o. female with PMH significant for hypertension, hyperlipidemia, pseudogout, lumbar spinal stenosis, complex partial epilepsy who lives in an assisted living facility. Patient was brought to the ED on 2/10 with complaint of worsening confusion and hyponatremia. 2 weeks prior, patient had watery, nonbloody diarrhea without fever, no recent antibiotic use or travel.  Diarrhea gradually improved.  But lethargy and confusion lasted.   2/8, blood work done at ALF showed sodium level low at 121.  Patient was started on IV hydration at the facility despite which sodium level continues to trend down and patient continued to remain lethargic and hence patient was sent to ED.  In the ED, patient was confused, afebrile, heart rate in 80s, blood pressure 159/44, breathing on roomair Labs confirmed low sodium level at 118, low potassium at 3, low magnesium level at 0.8, serum osmolality low at 252, hemoglobin slightly low at 10.9 Patient was given IV fluid and IV replacements. Admitted to hospitalist service. Serial BMPs monitored See below for details.  Subjective: Patient was seen and examined morning.  Pleasant elderly Caucasian female.  Cheerful. Propped up in bed.  Not in physical distress. Son Mr. [name] at bedside. Patient feels better and feels ready to go. Ate regular diet yesterday.  No longer having diarrhea. Labs this morning with sodium same as yesterday at 127. Iron level low at 9.  Denies any blood loss in the urine or stool.  Last colonoscopy several years ago was normal.  Assessment/Plan: Acute hypovolemic hyponatremia -Likely secondary to diarrhea loss, poor oral intake -Serum osmolality is low.  But urine osmolality also inappropriately low  raising suspicion of DI. -With IV hydration, sodium level gradually improved.  Also on salt tablet 3 times daily since yesterday.   -Sodium level today is static at 127.  We will stop IV fluid and continue salt tablets.  -Repeat sodium level tomorrow. Recent Labs  Lab 07/19/20 0000 07/21/20 1745 07/21/20 2102 07/22/20 0049 07/22/20 0804 07/22/20 1208 07/23/20 0442 07/24/20 0414  NA 121* 118* 119* 121* 121* 124* 127* 127*   Hypokalemia Hypomagnesemia Hypophosphatemia -Secondary to diarrhea loss and poor oral intake.  -Aggressively replaced.  Potassium level is improved.  Will repeat magnesium when phosphorus level tomorrow Recent Labs  Lab 07/21/20 1700 07/21/20 1745 07/22/20 0049 07/22/20 0804 07/22/20 1208 07/23/20 0442 07/24/20 0414  K  --    < > 3.6 3.7 3.5 3.1* 3.8  MG 0.8*  --   --  1.6*  --  1.4*  --   PHOS  --   --   --  2.3*  --  2.6  --    < > = values in this interval not displayed.   Acute metabolic encephalopathy Progressive lethargy -Likely due to low electrolyte levels, low oral intake -Also has underlying risk factors for dementia -Mental status intact at this time.  Prolonged diarrhea -10-day history of diarrhea without a clear etiology -Probably viral in nature.  Gradually improving. -Continue to monitor.  Imodium as needed -Patient is able to tolerate regular diet.  Chronic iron deficiency anemia -EGD/colonoscopy several years ago.  Reportedly normal. -Hemoglobin chronically between 9-10.  Down to 8.3 today.  No active bleeding.  Probably related to dilution. -Iron profile with serum  iron level low at 9 and saturation level low at 7%. -I started her on oral iron twice daily. Recent Labs    07/19/20 0000 07/21/20 1745 07/21/20 1745 07/22/20 0049 07/23/20 0442 07/24/20 0414  HGB 9.6* 10.9*  --  9.5* 9.2* 8.3*  MCV  --  93.8   < > 97.1 96.7 98.8  VITAMINB12  --   --   --   --   --  1,311*  FOLATE  --   --   --   --   --  13.5  FERRITIN   --   --   --   --   --  236  TIBC  --   --   --   --   --  122*  IRON  --   --   --   --   --  9*  RETICCTPCT  --   --   --   --   --  1.1   < > = values in this interval not displayed.   Essential hypertension -Currently controlled on Coreg 25 mg twice daily -Continue to monitor blood pressure  Mixed hyperlipidemia -Continue simvastatin.  h/o complex partial epilepsy -Continue antiseizure medications: Phenobarbital and phenytoin daily  Mobility: Encourage ambulation.  PT eval appreciated.  SNF recommended Code Status:   Code Status: DNR  Nutritional status: Body mass index is 24.16 kg/m. Nutrition Problem: Inadequate oral intake Etiology: decreased appetite Signs/Symptoms: meal completion < 50% Diet Order            Diet regular Room service appropriate? Yes; Fluid consistency: Thin  Diet effective now                 DVT prophylaxis: enoxaparin (LOVENOX) injection 40 mg Start: 07/21/20 2245   Antimicrobials:  None Fluid: Stop IV fluids today Consultants: None Family Communication:  Son at bedside  Status is: Inpatient  Remains inpatient appropriate because: Sodium level remains low.  Repeat tomorrow  Dispo: The patient is from: Home              Anticipated d/c is to: Likely back to [facility] at SNF level              Anticipated d/c date is: Hopefully tomorrow              Patient currently is not medically stable to d/c.   Difficult to place patient No       Infusions:    Scheduled Meds: . carvedilol  25 mg Oral BID  . Chlorhexidine Gluconate Cloth  6 each Topical Daily  . enoxaparin (LOVENOX) injection  40 mg Subcutaneous Q24H  . feeding supplement  1 Container Oral TID BM  . ferrous sulfate  325 mg Oral BID WC  . PHENobarbital  64.8 mg Oral Daily  . phenytoin  200 mg Oral Daily  . simvastatin  20 mg Oral QHS  . sodium chloride  1 g Oral TID WC    Antimicrobials: Anti-infectives (From admission, onward)   None      PRN  meds: acetaminophen **OR** acetaminophen, loperamide, ondansetron **OR** ondansetron (ZOFRAN) IV   Objective: Vitals:   07/23/20 2133 07/24/20 0458  BP: (!) 114/46 (!) 129/47  Pulse: 77 77  Resp:  20  Temp: 99.6 F (37.6 C) 98 F (36.7 C)  SpO2: 97% 96%    Intake/Output Summary (Last 24 hours) at 07/24/2020 1101 Last data filed at 07/24/2020 0200 Gross per 24 hour  Intake 4554.55  ml  Output --  Net 4554.55 ml   Filed Weights   07/22/20 0000  Weight: 67.9 kg   Weight change:  Body mass index is 24.16 kg/m.   Physical Exam: General exam: Pleasant, elderly.  Not in physical pain. Skin: No rashes, lesions or ulcers. HEENT: Atraumatic, normocephalic, no obvious bleeding Lungs: Clear to auscultation bilaterally CVS: Regular rate and rhythm, no murmur GI/Abd soft, nontender, nondistended, abdomen CNS: Alert, awake, oriented x3 Psychiatry: Mood appropriate Extremities: No pedal edema, no calf tenderness  Data Review: I have personally reviewed the laboratory data and studies available.  Recent Labs  Lab 07/19/20 0000 07/21/20 1745 07/22/20 0049 07/23/20 0442 07/24/20 0414  WBC 6.4 8.9 7.5 10.3 8.4  NEUTROABS  --  7.1 5.3 8.2* 6.5  HGB 9.6* 10.9* 9.5* 9.2* 8.3*  HCT 27* 30.0* 27.0* 26.0* 24.2*  MCV  --  93.8 97.1 96.7 98.8  PLT 181 197 160 165 154   Recent Labs  Lab 07/21/20 1700 07/21/20 1745 07/22/20 0049 07/22/20 0804 07/22/20 1208 07/23/20 0442 07/24/20 0414  NA  --    < > 121* 121* 124* 127* 127*  K  --    < > 3.6 3.7 3.5 3.1* 3.8  CL  --    < > 87* 90* 92* 95* 98  CO2  --    < > 24 23 22 23  21*  GLUCOSE  --    < > 108* 124* 173* 116* 115*  BUN  --    < > 7* 7* 7* 8 14  CREATININE  --    < > 0.69 0.71 0.81 0.85 0.95  CALCIUM  --    < > 7.4* 7.6* 7.5* 7.6* 7.7*  MG 0.8*  --   --  1.6*  --  1.4*  --   PHOS  --   --   --  2.3*  --  2.6  --    < > = values in this interval not displayed.    F/u labs ordered Unresulted Labs (From admission, onward)           Start     Ordered   07/25/20 0500  Magnesium  Tomorrow morning,   STAT       Question:  Specimen collection method  Answer:  Lab=Lab collect   07/24/20 1101   07/25/20 0500  Phosphorus  Tomorrow morning,   R       Question:  Specimen collection method  Answer:  Lab=Lab collect   07/24/20 1101   07/24/20 0500  CBC with Differential/Platelet  Daily,   R     Question:  Specimen collection method  Answer:  Lab=Lab collect   07/23/20 1037   07/23/20 5465  Basic metabolic panel  Daily,   R     Question:  Specimen collection method  Answer:  Lab=Lab collect   07/22/20 0856         Signed, Terrilee Croak, MD [facility] 07/24/2020

## 2020-07-25 LAB — CBC WITH DIFFERENTIAL/PLATELET
Abs Immature Granulocytes: 0.03 10*3/uL (ref 0.00–0.07)
Basophils Absolute: 0 10*3/uL (ref 0.0–0.1)
Basophils Relative: 0 %
Eosinophils Absolute: 0 10*3/uL (ref 0.0–0.5)
Eosinophils Relative: 0 %
HCT: 23.3 % — ABNORMAL LOW (ref 36.0–46.0)
Hemoglobin: 7.7 g/dL — ABNORMAL LOW (ref 12.0–15.0)
Immature Granulocytes: 0 %
Lymphocytes Relative: 11 %
Lymphs Abs: 0.7 10*3/uL (ref 0.7–4.0)
MCH: 34.1 pg — ABNORMAL HIGH (ref 26.0–34.0)
MCHC: 33 g/dL (ref 30.0–36.0)
MCV: 103.1 fL — ABNORMAL HIGH (ref 80.0–100.0)
Monocytes Absolute: 1 10*3/uL (ref 0.1–1.0)
Monocytes Relative: 15 %
Neutro Abs: 5 10*3/uL (ref 1.7–7.7)
Neutrophils Relative %: 74 %
Platelets: 169 10*3/uL (ref 150–400)
RBC: 2.26 MIL/uL — ABNORMAL LOW (ref 3.87–5.11)
RDW: 12.8 % (ref 11.5–15.5)
WBC: 6.8 10*3/uL (ref 4.0–10.5)
nRBC: 0 % (ref 0.0–0.2)

## 2020-07-25 LAB — BASIC METABOLIC PANEL
Anion gap: 9 (ref 5–15)
BUN: 23 mg/dL (ref 8–23)
CO2: 20 mmol/L — ABNORMAL LOW (ref 22–32)
Calcium: 7.8 mg/dL — ABNORMAL LOW (ref 8.9–10.3)
Chloride: 99 mmol/L (ref 98–111)
Creatinine, Ser: 1.09 mg/dL — ABNORMAL HIGH (ref 0.44–1.00)
GFR, Estimated: 51 mL/min — ABNORMAL LOW (ref >60)
Glucose, Bld: 114 mg/dL — ABNORMAL HIGH (ref 70–99)
Potassium: 3.5 mmol/L (ref 3.5–5.1)
Sodium: 128 mmol/L — ABNORMAL LOW (ref 135–145)

## 2020-07-25 LAB — SARS CORONAVIRUS 2 (TAT 6-24 HRS): SARS Coronavirus 2: NEGATIVE

## 2020-07-25 LAB — MAGNESIUM: Magnesium: 1.7 mg/dL (ref 1.7–2.4)

## 2020-07-25 LAB — PHOSPHORUS: Phosphorus: 3.4 mg/dL (ref 2.5–4.6)

## 2020-07-25 MED ORDER — LOPERAMIDE HCL 2 MG PO CAPS: 2.0000 mg | ORAL_CAPSULE | Freq: Four times a day (QID) | ORAL | 0 refills | Status: DC | PRN

## 2020-07-25 MED ORDER — SODIUM CHLORIDE 1 G PO TABS: 1.0000 g | ORAL_TABLET | Freq: Two times a day (BID) | ORAL | Status: AC

## 2020-07-25 MED ORDER — FERROUS SULFATE 325 (65 FE) MG PO TABS: 325.0000 mg | ORAL_TABLET | Freq: Two times a day (BID) | ORAL | 3 refills | Status: DC

## 2020-07-25 NOTE — Plan of Care (Signed)
  Problem: Clinical Measurements: Goal: Respiratory complications will improve Outcome: Adequate for Discharge   Problem: Clinical Measurements: Goal: Cardiovascular complication will be avoided Outcome: Adequate for Discharge

## 2020-07-25 NOTE — Progress Notes (Signed)
Called report to the RN supervisor at [facility].

## 2020-07-25 NOTE — TOC Transition Note (Signed)
Transition of Care [facility]) - CM/SW Discharge Note   Patient Details  Name: [name] MRN: [id number] Date of Birth: 1935/12/24  Transition of Care [facility]) CM/SW Contact:  Ross Ludwig, LCSW Phone Number: 07/25/2020, 11:48 AM   Clinical Narrative:     Patient to be d/c'ed today to [facility] room [place].  Patient and family agreeable to plans will transport via ems RN to call report to [facility] [phone].  CSW spoke to patient's daughter [name] via phone and she is aware that patient is discharging today.     Final next level of care: Skilled Nursing Facility Barriers to Discharge: Barriers Resolved   Patient Goals and CMS Choice Patient states their goals for this hospitalization and ongoing recovery are:: To go to [facility] for short term rehab. CMS Medicare.gov Compare Post Acute Care list provided to:: Patient Choice offered to / list presented to : Patient  Discharge Placement PASRR number recieved: 07/25/20            Patient chooses bed at: [facility] Patient to be transferred to facility by: [facility] Name of family member notified: [name],[name] Daughter   [phone]  [name], [name]   [phone] Patient and family notified of of transfer: 07/25/20  Discharge Plan and Services                                     Social Determinants of Health (SDOH) Interventions     Readmission Risk Interventions No flowsheet data found.

## 2020-07-25 NOTE — Discharge Summary (Signed)
Physician Discharge Summary  [name] PPJ:[id number] DOB: [birth year] DOA: 07/21/2020  PCP: Burnard Bunting, MD  Admit date: 07/21/2020 Discharge date: 07/25/2020  [name]charge disposition: [facility]   Code Status: DNR  Diet Recommendation: Regular diet  Discharge Diagnosis:   Principal Problem:   Hyponatremia Active Problems:   Essential hypertension   Mixed hyperlipidemia   Acute metabolic encephalopathy   History of complex partial epilepsy   Hypomagnesemia   Hypokalemia due to excessive gastrointestinal loss of potassium   Acute diarrhea   Pressure injury of skin   Chief Complaint  Patient presents with  . Abnormal Lab  . Weakness   Brief narrative: [name] is a 85 y.o. female with PMH significant for hypertension, hyperlipidemia, pseudogout, lumbar spinal stenosis, complex partial epilepsy who lives in an assisted living facility. Patient was brought to the ED on 2/10 with complaint of worsening confusion and hyponatremia. 2 weeks prior, patient had watery, nonbloody diarrhea without fever, no recent antibiotic use or travel.  Diarrhea gradually improved.  But lethargy and confusion lasted.   2/8, blood work done at ALF showed sodium level low at 121.  Patient was started on IV hydration at the facility despite which sodium level continues to trend down and patient continued to remain lethargic and hence patient was sent to ED.  In the ED, patient was confused, afebrile, heart rate in 80s, blood pressure 159/44, breathing on roomair Labs confirmed low sodium level at 118, low potassium at 3, low magnesium level at 0.8, serum osmolality low at 252, hemoglobin slightly low at 10.9 Patient was given IV fluid and IV replacements. Admitted to hospitalist service. Serial BMPs monitored See below for details.  Subjective: Patient was seen and examined morning.  Pleasant elderly Caucasian female.  Cheerful. Sitting up  in chair.  Not in physical distress. Son Mr. Milta Deiters at bedside. Patient feels better and feels ready to go. Ate regular diet yesterday.  No longer having diarrhea. Labs this morning with sodium same as yesterday at 127. Iron level low at 9.  Denies any blood loss in the urine or stool.  Last colonoscopy several years ago was normal.  Hospital course: Acute hypovolemic hyponatremia -Serum osmolality is low. Urine osmolality also inappropriately low which raised suspicion of DI.  However patient's daily urine output has not been excessive despite IV hydration.  So I think her hyponatremia was hypovolemic in nature due to poor oral intake and diarrhea. -Currently on IV fluid and salt tablet 3 times a day. -Sodium level gradually improved to 128.  Trend as below. -Diarrhea has improved.  P.o. intake improved.  Discharged on salt tablet twice daily for 1 week.  To repeat BMP in a week.  Also given referral to nephrologist if no correction sodium level in a week.   Recent Labs  Lab 07/19/20 0000 07/21/20 1745 07/21/20 2102 07/22/20 0049 07/22/20 0804 07/22/20 1208 07/23/20 0442 07/24/20 0414 07/25/20 0203  NA 121* 118* 119* 121* 121* 124* 127* 127* 128*   Hypokalemia Hypomagnesemia Hypophosphatemia -Secondary to diarrhea loss and poor oral intake.  -Aggressively replaced.  Levels normal in blood work today. Recent Labs  Lab 07/21/20 1700 07/21/20 1745 07/22/20 0804 07/22/20 1208 07/23/20 0442 07/24/20 0414 07/25/20 0203  K  --    < > 3.7 3.5 3.1* 3.8 3.5  MG 0.8*  --  1.6*  --  1.4*  --  1.7  PHOS  --   --  2.3*  --  2.6  --  3.4   < > = values in this interval not displayed.   Acute metabolic encephalopathy Progressive lethargy -Likely due to low electrolyte levels, low oral intake -Also has underlying risk factors for dementia -Mental status intact at this time.  Prolonged diarrhea -10-day history of diarrhea without a clear etiology -Probably viral in nature.   Improved. -Continue Imodium as needed -Patient is able to tolerate regular diet.  Chronic iron deficiency anemia -EGD/colonoscopy several years ago.  Reportedly normal. -Hemoglobin chronically between 9-10.  Down to 7.7 today.  No active bleeding.  Probably dilutional. -Iron profile with serum iron level low at 9 and saturation level low at 7%. -I started her on oral iron twice daily. Recent Labs    07/21/20 1745 07/22/20 0049 07/23/20 0442 07/24/20 0414 07/25/20 0203  HGB 10.9* 9.5* 9.2* 8.3* 7.7*  MCV 93.8 97.1 96.7 98.8 103.1*  VITAMINB12  --   --   --  1,311*  --   FOLATE  --   --   --  13.5  --   FERRITIN  --   --   --  236  --   TIBC  --   --   --  122*  --   IRON  --   --   --  9*  --   RETICCTPCT  --   --   --  1.1  --    Essential hypertension -Currently controlled on Coreg 25 mg twice daily -Continue to monitor blood pressure  Mixed hyperlipidemia -Continue simvastatin.  h/o complex partial epilepsy -Continue antiseizure medications: Phenobarbital and phenytoin daily   Wound care: Wound / Incision (Open or Dehisced) 07/22/20 (MASD) Moisture Associated Skin Damage Buttocks Mid Reddness/chaffing in between buttocks (Active)  Date First Assessed/Time First Assessed: 07/22/20 0026   Wound Type: (MASD) Moisture Associated Skin Damage  Location: Buttocks  Location Orientation: Mid  Wound Description (Comments): Reddness/chaffing in between buttocks  Present on Admission: Yes    Assessments 07/22/2020 12:00 AM 07/24/2020  8:00 AM  Dressing Type None None  Site / Wound Assessment Pink --  Peri-wound Assessment Intact;Erythema (blanchable) Excoriated;Erythema (blanchable)  Treatment -- Cleansed;Other (Comment)     No Linked orders to display     Pressure Injury 07/22/20 Coccyx Posterior;Mid Stage 2 -  Partial thickness loss of dermis presenting as a shallow open injury with a red, pink wound bed without slough. (Active)  Date First Assessed/Time First Assessed:  07/22/20 0400   Location: Coccyx  Location Orientation: Posterior;Mid  Staging: Stage 2 -  Partial thickness loss of dermis presenting as a shallow open injury with a red, pink wound bed without slough.  Presen...    Assessments 07/22/2020 12:00 AM 07/24/2020  7:30 PM  Dressing Type Foam - Lift dressing to assess site every shift --  Dressing Clean;Dry;Intact Clean;Dry;Intact  Dressing Change Frequency PRN --  Site / Wound Assessment Yellow --  Peri-wound Assessment Erythema (blanchable);Pink --  Treatment Cleansed;Off loading --     No Linked orders to display    Discharge Exam:   Vitals:   07/24/20 1248 07/24/20 2132 07/24/20 2300 07/25/20 0539  BP: (!) 125/38 (!) 126/47  (!) 121/46  Pulse: 67 75  76  Resp: 18 20  20   Temp: 98.1 F (36.7 C) (!) 100.9 F (38.3 C) 99.8 F (37.7 C) 98.9 F (37.2 C)  TempSrc: Oral Oral  Oral  SpO2: 98% 97%  98%  Weight:      Height:  Body mass index is 24.16 kg/m.  General exam: Pleasant, elderly Caucasian female.  Not in distress. Skin: No rashes, lesions or ulcers. HEENT: Atraumatic, normocephalic, no obvious bleeding Lungs: Clear to auscultation bilaterally CVS: Regular rate and rhythm, no murmur GI/Abd soft, nontender, nondistended, bowel sound present CNS: Alert, awake, oriented x3 Psychiatry: Mood appropriate Extremities: No pedal edema, no calf tenderness  Follow ups:   Discharge Instructions    Diet general   Complete by: As directed    Discharge wound care:   Complete by: As directed    Dry dressing   Increase activity slowly   Complete by: As directed       Follow-up Information    Burnard Bunting, MD Follow up.   Specialty: Internal Medicine Contact information: [place] [address] [phone]        Rosita Fire, MD Follow up.   Specialties: Nephrology, Internal Medicine Contact information: [address] [place] [address] [phone]                Recommendations for Outpatient Follow-Up:   1. Follow-up with PCP as an outpatient 2. Referral given for nephrology follow-up if sodium level does not improve  Discharge Instructions:  Follow with Primary MD Burnard Bunting, MD in 7 days   Get CBC/BMP checked in next visit within 1 week by PCP or SNF MD ( we routinely change or add medications that can affect your baseline labs and fluid status, therefore we recommend that you get the mentioned basic workup next visit with your PCP, your PCP may decide not to get them or add new tests based on their clinical decision)  On your next visit with your PCP, please Get Medicines reviewed and adjusted.  Please request your PCP  to go over all Hospital Tests and Procedure/Radiological results at the follow up, please get all Hospital records sent to your Prim MD by signing hospital release before you go home.  Activity: As tolerated with Full fall precautions use walker/cane & assistance as needed  For Heart failure patients - Check your Weight same time everyday, if you gain over 2 pounds, or you develop in leg swelling, experience more shortness of breath or chest pain, call your Primary MD immediately. Follow Cardiac Low Salt Diet and 1.5 lit/day fluid restriction.  If you have smoked or chewed Tobacco in the last 2 yrs please stop smoking, stop any regular Alcohol  and or any Recreational drug use.  If you experience worsening of your admission symptoms, develop shortness of breath, life threatening emergency, suicidal or homicidal thoughts you must seek medical attention immediately by calling 911 or calling your MD immediately  if symptoms less severe.  You Must read complete instructions/literature along with all the possible adverse reactions/side effects for all the Medicines you take and that have been prescribed to you. Take any new Medicines after you have completely understood and accpet all the possible adverse reactions/side  effects.   Do not drive, operate heavy machinery, perform activities at heights, swimming or participation in water activities or provide baby sitting services if your were admitted for syncope or siezures until you have seen by Primary MD or a Neurologist and advised to do so again.  Do not drive when taking Pain medications.  Do not take more than prescribed Pain, Sleep and Anxiety Medications  Wear Seat belts while driving.   Please note You were cared for by a hospitalist during your hospital stay. If you have any  questions about your discharge medications or the care you received while you were in the hospital after you are discharged, you can call the unit and asked to speak with the hospitalist on call if the hospitalist that took care of you is not available. Once you are discharged, your primary care physician will handle any further medical issues. Please note that NO REFILLS for any discharge medications will be authorized once you are discharged, as it is imperative that you return to your primary care physician (or establish a relationship with a primary care physician if you do not have one) for your aftercare needs so that they can reassess your need for medications and monitor your lab values.    Allergies as of 07/25/2020      Reactions   Azithromycin Other (See Comments)   unknown   Naproxen Sodium    REACTION: rash   Penicillins    REACTION: hives   Pravastatin    Sulfa Drugs Cross Reactors       Medication List    STOP taking these medications   sodium chloride 0.9 % infusion Replaced by: sodium chloride 1 g tablet     TAKE these medications   carvedilol 25 MG tablet Commonly known as: COREG TAKE 1 TABLET BY MOUTH TWICE DAILY. What changed: when to take this   ferrous sulfate 325 (65 FE) MG tablet Take 1 tablet (325 mg total) by mouth 2 (two) times daily with a meal.   loperamide 2 MG capsule Commonly known as: IMODIUM Take 1 capsule (2 mg total) by  mouth every 6 (six) hours as needed for diarrhea or loose stools.   PHENobarbital 32.4 MG tablet Commonly known as: LUMINAL Take 64.8 mg by mouth daily.   phenytoin 100 MG ER capsule Commonly known as: DILANTIN Take 200 mg by mouth daily.   simvastatin 20 MG tablet Commonly known as: ZOCOR Take 20 mg by mouth at bedtime.   sodium chloride 1 g tablet Take 1 tablet (1 g total) by mouth 2 (two) times daily with a meal for 7 days. Replaces: sodium chloride 0.9 % infusion            Discharge Care Instructions  (From admission, onward)         Start     Ordered   07/25/20 0000  Discharge wound care:       Comments: Dry dressing   07/25/20 1035          Time coordinating discharge: 35 minutes  The results of significant diagnostics from this hospitalization (including imaging, microbiology, ancillary and laboratory) are listed below for reference.    Procedures and Diagnostic Studies:   No results found.   Labs:   Basic Metabolic Panel: Recent Labs  Lab 07/21/20 1700 07/21/20 1745 07/22/20 0804 07/22/20 1208 07/23/20 0442 07/24/20 0414 07/25/20 0203  NA  --    < > 121* 124* 127* 127* 128*  K  --    < > 3.7 3.5 3.1* 3.8 3.5  CL  --    < > 90* 92* 95* 98 99  CO2  --    < > 23 22 23  21* 20*  GLUCOSE  --    < > 124* 173* 116* 115* 114*  BUN  --    < > 7* 7* 8 14 23   CREATININE  --    < > 0.71 0.81 0.85 0.95 1.09*  CALCIUM  --    < > 7.6* 7.5* 7.6* 7.7* 7.8*  MG 0.8*  --  1.6*  --  1.4*  --  1.7  PHOS  --   --  2.3*  --  2.6  --  3.4   < > = values in this interval not displayed.   GFR Estimated Creatinine Clearance: 37.3 mL/min (A) (by C-G formula based on SCr of 1.09 mg/dL (H)). Liver Function Tests: Recent Labs  Lab 07/19/20 0000 07/21/20 1745  AST 30 31  ALT 14 16  ALKPHOS  --  56  BILITOT  --  1.0  PROT  --  6.6  ALBUMIN 3.6 3.5   No results for input(s): LIPASE, AMYLASE in the last 168 hours. No results for input(s): AMMONIA in the last  168 hours. Coagulation profile No results for input(s): INR, PROTIME in the last 168 hours.  CBC: Recent Labs  Lab 07/21/20 1745 07/22/20 0049 07/23/20 0442 07/24/20 0414 07/25/20 0203  WBC 8.9 7.5 10.3 8.4 6.8  NEUTROABS 7.1 5.3 8.2* 6.5 5.0  HGB 10.9* 9.5* 9.2* 8.3* 7.7*  HCT 30.0* 27.0* 26.0* 24.2* 23.3*  MCV 93.8 97.1 96.7 98.8 103.1*  PLT 197 160 165 154 169   Cardiac Enzymes: No results for input(s): CKTOTAL, CKMB, CKMBINDEX, TROPONINI in the last 168 hours. BNP: Invalid input(s): POCBNP CBG: No results for input(s): GLUCAP in the last 168 hours. D-Dimer No results for input(s): DDIMER in the last 72 hours. Hgb A1c No results for input(s): HGBA1C in the last 72 hours. Lipid Profile No results for input(s): CHOL, HDL, LDLCALC, TRIG, CHOLHDL, LDLDIRECT in the last 72 hours. Thyroid function studies No results for input(s): TSH, T4TOTAL, T3FREE, THYROIDAB in the last 72 hours.  Invalid input(s): FREET3 Anemia work up Recent Labs    07/24/20 0414  VITAMINB12 1,311*  FOLATE 13.5  FERRITIN 236  TIBC 122*  IRON 9*  RETICCTPCT 1.1   Microbiology Recent Results (from the past 240 hour(s))  SARS CORONAVIRUS 2 (TAT 6-24 HRS) Nasopharyngeal Nasopharyngeal Swab     Status: None   Collection Time: 07/21/20  5:30 PM   Specimen: Nasopharyngeal Swab  Result Value Ref Range Status   SARS Coronavirus 2 NEGATIVE NEGATIVE Final    Comment: (NOTE) SARS-CoV-2 target nucleic acids are NOT DETECTED.  The SARS-CoV-2 RNA is generally detectable in upper and lower respiratory specimens during the acute phase of infection. Negative results do not preclude SARS-CoV-2 infection, do not rule out co-infections with other pathogens, and should not be used as the sole basis for treatment or other patient management decisions. Negative results must be combined with clinical observations, patient history, and epidemiological information. The expected result is Negative.  Fact Sheet  for Patients: SugarRoll.be  Fact Sheet for Healthcare Providers: https://www.woods-mathews.com/  This test is not yet approved or cleared by the [place] FDA and  has been authorized for detection and/or diagnosis of SARS-CoV-2 by FDA under an Emergency Use Authorization (EUA). This EUA will remain  in effect (meaning this test can be used) for the duration of the COVID-19 declaration under Se ction 564(b)(1) of the Act, 21 U.S.C. section 360bbb-3(b)(1), unless the authorization is terminated or revoked sooner.  Performed at [facility], [place] [address], [place], [place] [address]   C Difficile Quick Screen w PCR reflex     Status: None   Collection Time: 07/22/20  4:19 AM   Specimen: STOOL  Result Value Ref Range Status   C Diff antigen NEGATIVE NEGATIVE Final   C Diff toxin NEGATIVE NEGATIVE Final  C Diff interpretation No C. difficile detected.  Final    Comment: Performed at [facility], [place] [address] [place], [place] [address]     Signed: Marlowe Aschoff Gracin [place]  [facility] 07/25/2020, 10:36 AM

## 2020-07-25 NOTE — NC FL2 (Signed)
[place] LEVEL OF CARE SCREENING TOOL     IDENTIFICATION  Patient Name: [name] Birthdate: [birth year] Sex: female Admission Date (Current Location): 07/21/2020  [facility] and [place] Number:  Herbalist and Address:  [facility],  [address] [place], [place]      Provider Number: [id number]  Attending Physician Name and Address:  Terrilee Croak, MD  Relative Name and Phone Number:  [name],[name] Daughter   [phone] or SHEBRA, MULDROW   [phone]    Current Level of Care: Hospital Recommended Level of Care: [place] Prior Approval Number:    Date Approved/Denied:   PASRR Number: [id number]  Discharge Plan: SNF    Current Diagnoses: Patient Active Problem List   Diagnosis Date Noted  . Pressure injury of skin 07/22/2020  . Hyponatremia 07/21/2020  . Mixed hyperlipidemia 07/21/2020  . Acute metabolic encephalopathy 02/72/5366  . History of complex partial epilepsy 07/21/2020  . Hypomagnesemia 07/21/2020  . Hypokalemia due to excessive gastrointestinal loss of potassium 07/21/2020  . Acute diarrhea 07/21/2020  . Partial symptomatic epilepsy with complex partial seizures, not intractable, without status epilepticus ([place]) 07/28/2014  . Spinal stenosis of lumbar region 07/28/2014  . Abnormality of gait 11/24/2013  . Low back pain 11/24/2013  . Essential hypertension   . Hypercholesterolemia   . Carotid arterial disease ([place])   . MVP (mitral valve prolapse)   . Abnormal cardiovascular stress test     Orientation RESPIRATION BLADDER Height & Weight     Self,Time,Situation,Place  Normal Incontinent Weight: 149 lb 11.1 oz (67.9 kg) Height:  5\' 6"  (167.6 cm)  BEHAVIORAL SYMPTOMS/MOOD NEUROLOGICAL BOWEL NUTRITION STATUS      Continent Diet (Cardiac diet)  AMBULATORY STATUS COMMUNICATION OF NEEDS Skin   Limited Assist Verbally PU Stage and Appropriate Care   PU Stage 2 Dressing:  (PRN dressing  change)                   Personal Care Assistance Level of Assistance  Bathing,Feeding,Dressing Bathing Assistance: Limited assistance Feeding assistance: Independent Dressing Assistance: Limited assistance     Functional Limitations Info  Sight,Hearing,Speech Sight Info: Adequate Hearing Info: Adequate Speech Info: Adequate    SPECIAL CARE FACTORS FREQUENCY  OT (By licensed OT),PT (By licensed PT)     PT Frequency: Minimum 5x a week OT Frequency: Minimum 5x a week            Contractures Contractures Info: Not present    Additional Factors Info  Code Status,Allergies Code Status Info: DNR Allergies Info: Azithromycin   Naproxen Sodium   Penicillins   Pravastatin   Sulfa Drugs Cross Reactors           Current Medications (07/25/2020):  This is the current hospital active medication list Current Facility-Administered Medications  Medication Dose Route Frequency Provider Last Rate Last Admin  . acetaminophen (TYLENOL) tablet 650 mg  650 mg Oral Q6H PRN Vernelle Emerald, MD   650 mg at 07/24/20 2140   Or  . acetaminophen (TYLENOL) suppository 650 mg  650 mg Rectal Q6H PRN Vernelle Emerald, MD      . carvedilol (COREG) tablet 25 mg  25 mg Oral BID Vernelle Emerald, MD   25 mg at 07/25/20 0834  . Chlorhexidine Gluconate Cloth 2 % PADS 6 each  6 each Topical Daily Shalhoub, Sherryll Burger, MD   6 each at 07/23/20 [phone]  . enoxaparin (LOVENOX) injection 40 mg  40 mg Subcutaneous Q24H Vernelle Emerald, MD   40 mg at 07/24/20 2131  . feeding supplement (BOOST / RESOURCE BREEZE) liquid 1 Container  1 Container Oral TID BM Shalhoub, Sherryll Burger, MD   1 Container at 07/25/20 [phone]  . ferrous sulfate tablet 325 mg  325 mg Oral BID WC Dahal, Marlowe Aschoff, MD   325 mg at 07/25/20 0834  . loperamide (IMODIUM) capsule 2 mg  2 mg Oral Q6H PRN Dahal, Binaya, MD      . ondansetron (ZOFRAN) tablet 4 mg  4 mg Oral Q6H PRN Shalhoub, Sherryll Burger, MD       Or  . ondansetron (ZOFRAN) injection 4 mg   4 mg Intravenous Q6H PRN Shalhoub, Sherryll Burger, MD      . PHENobarbital (LUMINAL) tablet 64.8 mg  64.8 mg Oral Daily Shalhoub, Sherryll Burger, MD   64.8 mg at 07/25/20 3646  . phenytoin (DILANTIN) ER capsule 200 mg  200 mg Oral Daily Shalhoub, Sherryll Burger, MD   200 mg at 07/25/20 0834  . simvastatin (ZOCOR) tablet 20 mg  20 mg Oral QHS Shalhoub, Sherryll Burger, MD   20 mg at 07/24/20 2131  . sodium chloride tablet 1 g  1 g Oral TID WC Dahal, Marlowe Aschoff, MD   1 g at 07/25/20 8032     Discharge Medications: Please see discharge summary for a list of discharge medications.  Relevant Imaging Results:  Relevant Lab Results:   Additional Information SSN [id number]  Ross Ludwig, LCSW

## 2020-07-25 NOTE — Progress Notes (Signed)
Pt was pick up by [facility]. Attempted to call [facility] few times for report at [phone] and no one is answering the phone.

## 2020-07-26 DIAGNOSIS — R278 Other lack of coordination: Secondary | ICD-10-CM | POA: Diagnosis not present

## 2020-07-26 DIAGNOSIS — E876 Hypokalemia: Secondary | ICD-10-CM | POA: Diagnosis not present

## 2020-07-26 DIAGNOSIS — M62561 Muscle wasting and atrophy, not elsewhere classified, right lower leg: Secondary | ICD-10-CM | POA: Diagnosis not present

## 2020-07-26 DIAGNOSIS — E871 Hypo-osmolality and hyponatremia: Secondary | ICD-10-CM | POA: Diagnosis not present

## 2020-07-26 DIAGNOSIS — I10 Essential (primary) hypertension: Secondary | ICD-10-CM | POA: Diagnosis not present

## 2020-07-26 DIAGNOSIS — M62562 Muscle wasting and atrophy, not elsewhere classified, left lower leg: Secondary | ICD-10-CM | POA: Diagnosis not present

## 2020-07-26 DIAGNOSIS — G9341 Metabolic encephalopathy: Secondary | ICD-10-CM | POA: Diagnosis not present

## 2020-07-26 DIAGNOSIS — R2689 Other abnormalities of gait and mobility: Secondary | ICD-10-CM | POA: Diagnosis not present

## 2020-07-27 ENCOUNTER — Encounter: Payer: Self-pay | Admitting: Internal Medicine

## 2020-07-27 ENCOUNTER — Non-Acute Institutional Stay (SKILLED_NURSING_FACILITY): Payer: Medicare Other | Admitting: Internal Medicine

## 2020-07-27 DIAGNOSIS — E876 Hypokalemia: Secondary | ICD-10-CM

## 2020-07-27 DIAGNOSIS — Z9189 Other specified personal risk factors, not elsewhere classified: Secondary | ICD-10-CM | POA: Diagnosis not present

## 2020-07-27 DIAGNOSIS — E871 Hypo-osmolality and hyponatremia: Secondary | ICD-10-CM

## 2020-07-27 DIAGNOSIS — L89309 Pressure ulcer of unspecified buttock, unspecified stage: Secondary | ICD-10-CM | POA: Diagnosis not present

## 2020-07-27 DIAGNOSIS — G9341 Metabolic encephalopathy: Secondary | ICD-10-CM | POA: Diagnosis not present

## 2020-07-27 DIAGNOSIS — Z20828 Contact with and (suspected) exposure to other viral communicable diseases: Secondary | ICD-10-CM | POA: Diagnosis not present

## 2020-07-27 DIAGNOSIS — R0981 Nasal congestion: Secondary | ICD-10-CM

## 2020-07-27 DIAGNOSIS — D509 Iron deficiency anemia, unspecified: Secondary | ICD-10-CM | POA: Diagnosis not present

## 2020-07-27 DIAGNOSIS — I10 Essential (primary) hypertension: Secondary | ICD-10-CM

## 2020-07-27 DIAGNOSIS — G40209 Localization-related (focal) (partial) symptomatic epilepsy and epileptic syndromes with complex partial seizures, not intractable, without status epilepticus: Secondary | ICD-10-CM

## 2020-07-27 DIAGNOSIS — R197 Diarrhea, unspecified: Secondary | ICD-10-CM

## 2020-07-27 LAB — CBC AND DIFFERENTIAL
HCT: 26 — AB (ref 36–46)
Hemoglobin: 8.9 — AB (ref 12.0–16.0)
Platelets: 242 (ref 150–399)
WBC: 4.3

## 2020-07-27 LAB — CBC: RBC: 2.68 — AB (ref 3.87–5.11)

## 2020-07-27 NOTE — Progress Notes (Signed)
Provider:  Rexene Edison. Mariea Clonts, D.O., C.M.D. Location:  [facility] Room Number: 159 Place of Service:  SNF (31)  PCP: Burnard Bunting, MD Patient Care Team: Burnard Bunting, MD as PCP - General (Internal Medicine)  Extended Emergency Contact Information Primary Emergency Contact: [place] Mobile Phone: [phone] Relation: Daughter Secondary Emergency Contact: DEMONI, PARMAR Mobile Phone: [phone] Relation: Son  Code Status: DNR Goals of Care: Advanced Directive information Advanced Directives 07/21/2020  Does Patient Have a Medical Advance Directive? Yes  Type of Paramedic of [place];Living will  Does patient want to make changes to medical advance directive? No - Patient declined  Copy of [place] in Chart? No - copy requested   Chief Complaint  Patient presents with  . Readmit To SNF    Readmit To [facility]     HPI: Patient is a 85 y.o. female seen today for readmission to [facility] 2/14 after being sent out to the hospital on 07/22/19 due to confusion, persistent hyponatremia that actually worsened with NS fluids, and her underlying seizure history.  She'd been having diarrhea off and on for the prior 2 weeks.  She has a PMH significant for htn, hyperlipidemia, pseudogout, lumbar spinal stenosis and complex partial epilepsy.  Her daughter accompanied her to the ED.  After her diarrhea, her sodium had dropped to 121 2/8 when admitted to rehab and she was lethargic, confused.  Stool studies had been ordered, as well as levels of her antiepileptics.  She was started on NS at 80cc/hr which she'd only had for about 24 hrs and sodium actually decreased.  Na was then 118 on 2/10.  She was hypomagnesemic and this was repleted.  Hospitalists admitted her.  Hyponatremia was still felt to be hypovolemic.  She was continued on IVFs and also given salt tablets tid.  Her diarrhea improved.   Sodium was reduced to bid for a week.  Repeat bmp recommended in a week.  If not better, nephrology referral recommended.  She also had her phos and potassium repleted.  She was found to have iron deficiency anemia and started on iron bid.    She had some contact dermatitis and erythema of her buttocks from urine and stool noted at discharge.    When seen today, she was still feeling really weak.  Unfortunately, she now has had a nasal congestion and cough upon returning from the hospital so she's in covid isolation.  The rapid test was not done earlier when isolation initiated until I saw her.  She's been having normal BMs.  Intake is still poor vs baseline.  She seems a good bit clearer and words are coming to her better.  Her bottom is still sore.    Past Medical History:  Diagnosis Date  . Abnormal cardiovascular stress test   . Carotid arterial disease (HCC)    MODERATE RIGHT  . Cerebral AV malformation   . DDD (degenerative disc disease)    SEVERE WITH SPINAL STENOSIS  . Hypercholesterolemia   . Hypertension   . MVP (mitral valve prolapse)    MILD  . Numbness    LEFT FOOT AND ANKLE  . Osteopenia   . Pseudo-gout    Past Surgical History:  Procedure Laterality Date  . APPENDECTOMY    . BREAST MASS EXCISION    . CARPAL TUNNEL RELEASE    . REMOVAL OF PARATHYROID GLAND    . STRESS NUCLEAR STUDY     ABNORMAL  .  TONSILLECTOMY      Social History   Socioeconomic History  . Marital status: Married    Spouse name: Not on file  . Number of children: 3  . Years of education: Not on file  . Highest education level: Not on file  Occupational History  . Not on file  Tobacco Use  . Smoking status: Former Smoker    Packs/day: 1.00    Years: 14.00    Pack years: 14.00    Types: Cigarettes    Quit date: 11/07/1968    Years since quitting: 51.7  . Smokeless tobacco: Never Used  Substance and Sexual Activity  . Alcohol use: No  . Drug use: No  . Sexual activity: Not on file   Other Topics Concern  . Not on file  Social History Narrative   [place] garden home at [place] Strain: Not on file  Food Insecurity: Not on file  Transportation Needs: Not on file  Physical Activity: Not on file  Stress: Not on file  Social Connections: Not on file    reports that she quit smoking about 51 years ago. Her smoking use included cigarettes. She has a 14.00 pack-year smoking history. She has never used smokeless tobacco. She reports that she does not drink alcohol and does not use drugs.  Functional Status Survey:    Family History  Problem Relation Age of Onset  . Stroke Mother   . Lung cancer Father   . Breast cancer Neg Hx     Health Maintenance  Topic Date Due  . TETANUS/TDAP  Never done  . DEXA SCAN  Never done  . PNA vac Low Risk Adult (1 of 2 - PCV13) Never done  . INFLUENZA VACCINE  Completed  . COVID-19 Vaccine  Completed    Allergies  Allergen Reactions  . Azithromycin Other (See Comments)    unknown  . Naproxen Sodium     REACTION: rash  . Penicillins     REACTION: hives  . Pravastatin   . Sulfa Drugs Cross Reactors     Outpatient Encounter Medications as of 07/27/2020  Medication Sig  . carvedilol (COREG) 25 MG tablet TAKE 1 TABLET BY MOUTH TWICE DAILY. (Patient taking differently: Take 25 mg by mouth in the morning and at bedtime.)  . ferrous sulfate 325 (65 FE) MG tablet Take 1 tablet (325 mg total) by mouth 2 (two) times daily with a meal.  . loperamide (IMODIUM) 2 MG capsule Take 1 capsule (2 mg total) by mouth every 6 (six) hours as needed for diarrhea or loose stools.  Marland Kitchen PHENobarbital (LUMINAL) 32.4 MG tablet Take 64.8 mg by mouth daily.  . phenytoin (DILANTIN) 100 MG ER capsule Take 200 mg by mouth daily.  . simvastatin (ZOCOR) 20 MG tablet Take 20 mg by mouth at bedtime.  . sodium chloride 1 g tablet Take 1 tablet (1 g total) by mouth 2 (two) times daily with a meal for 7 days.    No facility-administered encounter medications on file as of 07/27/2020.    Review of Systems  Constitutional: Positive for malaise/fatigue and weight loss. Negative for chills and fever.  HENT: Positive for congestion and hearing loss. Negative for sore throat.   Eyes: Negative for blurred vision.       Glasses  Respiratory: Positive for cough. Negative for shortness of breath.   Cardiovascular: Positive for leg swelling. Negative for chest pain and palpitations.  Gastrointestinal:  Negative for abdominal pain, blood in stool, constipation, diarrhea, melena, nausea and vomiting.  Genitourinary: Negative for dysuria.  Musculoskeletal: Negative for falls and joint pain.  Skin: Negative for itching and rash.  Neurological: Positive for weakness. Negative for dizziness and loss of consciousness.  Endo/Heme/Allergies: Bruises/bleeds easily.  Psychiatric/Behavioral: Negative for depression. The patient is not nervous/anxious and does not have insomnia.        Mild word-finding challenges--speaking a bit more fluently today    There were no vitals filed for this visit. There is no height or weight on file to calculate BMI. Physical Exam Vitals and nursing note reviewed.  Constitutional:      General: She is not in acute distress.    Appearance: Normal appearance. She is not toxic-appearing.  HENT:     Head: Normocephalic and atraumatic.     Right Ear: External ear normal.     Left Ear: External ear normal.     Nose: Congestion present.     Mouth/Throat:     Pharynx: No oropharyngeal exudate.  Eyes:     Extraocular Movements: Extraocular movements intact.     Conjunctiva/sclera: Conjunctivae normal.     Pupils: Pupils are equal, round, and reactive to light.     Comments: glasses  Cardiovascular:     Rate and Rhythm: Normal rate and regular rhythm.  Pulmonary:     Effort: Pulmonary effort is normal.     Breath sounds: Normal breath sounds. No wheezing, rhonchi or rales.   Abdominal:     General: Bowel sounds are normal. There is no distension.     Palpations: Abdomen is soft.     Tenderness: There is no abdominal tenderness. There is no guarding or rebound.  Musculoskeletal:        General: Normal range of motion.     Cervical back: Neck supple.     Right lower leg: Edema present.     Left lower leg: Edema present.     Comments: Nonpitting edema bilaterally  Lymphadenopathy:     Cervical: No cervical adenopathy.  Skin:    Comments: Buttock wound could not be visualized during this visit  Neurological:     General: No focal deficit present.     Mental Status: She is alert and oriented to person, place, and time.     Motor: Weakness present.     Comments: Mild word-finding difficulty, more fluent than pre-hospitalization  Psychiatric:        Mood and Affect: Mood normal.     Labs reviewed: Basic Metabolic Panel: Recent Labs    07/22/20 0804 07/22/20 1208 07/23/20 0442 07/24/20 0414 07/25/20 0203  NA 121*   < > 127* 127* 128*  K 3.7   < > 3.1* 3.8 3.5  CL 90*   < > 95* 98 99  CO2 23   < > 23 21* 20*  GLUCOSE 124*   < > 116* 115* 114*  BUN 7*   < > 8 14 23   CREATININE 0.71   < > 0.85 0.95 1.09*  CALCIUM 7.6*   < > 7.6* 7.7* 7.8*  MG 1.6*  --  1.4*  --  1.7  PHOS 2.3*  --  2.6  --  3.4   < > = values in this interval not displayed.   Liver Function Tests: Recent Labs    07/19/20 0000 07/21/20 1745  AST 30 31  ALT 14 16  ALKPHOS  --  56  BILITOT  --  1.0  PROT  --  6.6  ALBUMIN 3.6 3.5   No results for input(s): LIPASE, AMYLASE in the last 8760 hours. No results for input(s): AMMONIA in the last 8760 hours. CBC: Recent Labs    07/23/20 0442 07/24/20 0414 07/25/20 0203  WBC 10.3 8.4 6.8  NEUTROABS 8.2* 6.5 5.0  HGB 9.2* 8.3* 7.7*  HCT 26.0* 24.2* 23.3*  MCV 96.7 98.8 103.1*  PLT 165 154 169   Cardiac Enzymes: No results for input(s): CKTOTAL, CKMB, CKMBINDEX, TROPONINI in the last 8760 hours. BNP: Invalid input(s):  POCBNP No results found for: HGBA1C Lab Results  Component Value Date   TSH 1.685 07/21/2020   Lab Results  Component Value Date   VITAMINB12 1,311 (H) 07/24/2020   Lab Results  Component Value Date   FOLATE 13.5 07/24/2020   Lab Results  Component Value Date   IRON 9 (L) 07/24/2020   TIBC 122 (L) 07/24/2020   FERRITIN 236 07/24/2020      Assessment/Plan 1. Acute diarrhea -appears resolved, but we will see as she eats more (returned last time) -has prn imodium -if remains an issue, will need GI eval for colitis  2. Hyponatremia -improved, still on sodium tablets a few more days, then need to recheck when off of these to see if levels remain therapeutic -otherwise needs nephrology f/u for possible diabetes insipidus  3. Partial symptomatic epilepsy with complex partial seizures, not intractable, without status epilepticus (HCC) -cont phenobarbital and dilantin   4. Acute metabolic encephalopathy -improved with correction of hyponatremia and dehydration from diarrhea  5. Hypomagnesemia -was repleted at the hospital  6. Hypokalemia due to excessive gastrointestinal loss of potassium -was repleted at the hospital  7. Nasal congestion -in isolation pending covid rapid and PCR -cont conservative txs  8.  Buttock pressure injury and excoriation/contact derm -being addressed by wound care nurse  9.  Essential htn:  Cont regimen and monitor--may need adjustment but current readings could be due to recent IVFs and sodium tabs  10.  Generalized weakness post hospitalization:  Needs PT, OT eval and tx  Family/ staff Communication: d/w rehab nurse  Labs/tests ordered:  F/u cbc, bmp  Shaketta Rill L. Nyzaiah Kai, D.O. [facility] [address], [place] [address] Cell Phone (Mon-Fri 8am-5pm):  [phone] On Call:  [phone] & follow prompts after 5pm & weekends Office Phone:  [phone] Office Fax:  [phone]

## 2020-07-28 DIAGNOSIS — F445 Conversion disorder with seizures or convulsions: Secondary | ICD-10-CM | POA: Diagnosis not present

## 2020-07-30 DIAGNOSIS — M62561 Muscle wasting and atrophy, not elsewhere classified, right lower leg: Secondary | ICD-10-CM | POA: Diagnosis not present

## 2020-07-30 DIAGNOSIS — E876 Hypokalemia: Secondary | ICD-10-CM | POA: Diagnosis not present

## 2020-07-30 DIAGNOSIS — I10 Essential (primary) hypertension: Secondary | ICD-10-CM | POA: Diagnosis not present

## 2020-07-30 DIAGNOSIS — M62562 Muscle wasting and atrophy, not elsewhere classified, left lower leg: Secondary | ICD-10-CM | POA: Diagnosis not present

## 2020-07-30 DIAGNOSIS — G9341 Metabolic encephalopathy: Secondary | ICD-10-CM | POA: Diagnosis not present

## 2020-07-30 DIAGNOSIS — R278 Other lack of coordination: Secondary | ICD-10-CM | POA: Diagnosis not present

## 2020-07-30 DIAGNOSIS — E871 Hypo-osmolality and hyponatremia: Secondary | ICD-10-CM | POA: Diagnosis not present

## 2020-07-30 DIAGNOSIS — R2689 Other abnormalities of gait and mobility: Secondary | ICD-10-CM | POA: Diagnosis not present

## 2020-07-31 DIAGNOSIS — I10 Essential (primary) hypertension: Secondary | ICD-10-CM | POA: Diagnosis not present

## 2020-07-31 DIAGNOSIS — G9341 Metabolic encephalopathy: Secondary | ICD-10-CM | POA: Diagnosis not present

## 2020-07-31 DIAGNOSIS — R278 Other lack of coordination: Secondary | ICD-10-CM | POA: Diagnosis not present

## 2020-07-31 DIAGNOSIS — M62562 Muscle wasting and atrophy, not elsewhere classified, left lower leg: Secondary | ICD-10-CM | POA: Diagnosis not present

## 2020-07-31 DIAGNOSIS — E876 Hypokalemia: Secondary | ICD-10-CM | POA: Diagnosis not present

## 2020-07-31 DIAGNOSIS — E871 Hypo-osmolality and hyponatremia: Secondary | ICD-10-CM | POA: Diagnosis not present

## 2020-07-31 DIAGNOSIS — R2689 Other abnormalities of gait and mobility: Secondary | ICD-10-CM | POA: Diagnosis not present

## 2020-07-31 DIAGNOSIS — M62561 Muscle wasting and atrophy, not elsewhere classified, right lower leg: Secondary | ICD-10-CM | POA: Diagnosis not present

## 2020-08-01 DIAGNOSIS — R2689 Other abnormalities of gait and mobility: Secondary | ICD-10-CM | POA: Diagnosis not present

## 2020-08-01 DIAGNOSIS — R278 Other lack of coordination: Secondary | ICD-10-CM | POA: Diagnosis not present

## 2020-08-01 DIAGNOSIS — M62561 Muscle wasting and atrophy, not elsewhere classified, right lower leg: Secondary | ICD-10-CM | POA: Diagnosis not present

## 2020-08-01 DIAGNOSIS — G9341 Metabolic encephalopathy: Secondary | ICD-10-CM | POA: Diagnosis not present

## 2020-08-01 DIAGNOSIS — M62562 Muscle wasting and atrophy, not elsewhere classified, left lower leg: Secondary | ICD-10-CM | POA: Diagnosis not present

## 2020-08-01 DIAGNOSIS — I10 Essential (primary) hypertension: Secondary | ICD-10-CM | POA: Diagnosis not present

## 2020-08-01 DIAGNOSIS — E871 Hypo-osmolality and hyponatremia: Secondary | ICD-10-CM | POA: Diagnosis not present

## 2020-08-01 DIAGNOSIS — E876 Hypokalemia: Secondary | ICD-10-CM | POA: Diagnosis not present

## 2020-08-02 DIAGNOSIS — M62562 Muscle wasting and atrophy, not elsewhere classified, left lower leg: Secondary | ICD-10-CM | POA: Diagnosis not present

## 2020-08-02 DIAGNOSIS — M62561 Muscle wasting and atrophy, not elsewhere classified, right lower leg: Secondary | ICD-10-CM | POA: Diagnosis not present

## 2020-08-02 DIAGNOSIS — M6389 Disorders of muscle in diseases classified elsewhere, multiple sites: Secondary | ICD-10-CM | POA: Diagnosis not present

## 2020-08-02 DIAGNOSIS — E871 Hypo-osmolality and hyponatremia: Secondary | ICD-10-CM | POA: Diagnosis not present

## 2020-08-02 DIAGNOSIS — R278 Other lack of coordination: Secondary | ICD-10-CM | POA: Diagnosis not present

## 2020-08-02 DIAGNOSIS — E876 Hypokalemia: Secondary | ICD-10-CM | POA: Diagnosis not present

## 2020-08-02 DIAGNOSIS — I10 Essential (primary) hypertension: Secondary | ICD-10-CM | POA: Diagnosis not present

## 2020-08-02 DIAGNOSIS — R2689 Other abnormalities of gait and mobility: Secondary | ICD-10-CM | POA: Diagnosis not present

## 2020-08-02 DIAGNOSIS — G9341 Metabolic encephalopathy: Secondary | ICD-10-CM | POA: Diagnosis not present

## 2020-08-02 MED ORDER — CARVEDILOL 25 MG PO TABS: 25.0000 mg | ORAL_TABLET | Freq: Two times a day (BID) | ORAL | 0 refills | Status: DC

## 2020-08-03 DIAGNOSIS — R278 Other lack of coordination: Secondary | ICD-10-CM | POA: Diagnosis not present

## 2020-08-03 DIAGNOSIS — I10 Essential (primary) hypertension: Secondary | ICD-10-CM | POA: Diagnosis not present

## 2020-08-03 DIAGNOSIS — R2689 Other abnormalities of gait and mobility: Secondary | ICD-10-CM | POA: Diagnosis not present

## 2020-08-03 DIAGNOSIS — G9341 Metabolic encephalopathy: Secondary | ICD-10-CM | POA: Diagnosis not present

## 2020-08-03 DIAGNOSIS — M62561 Muscle wasting and atrophy, not elsewhere classified, right lower leg: Secondary | ICD-10-CM | POA: Diagnosis not present

## 2020-08-03 DIAGNOSIS — M6389 Disorders of muscle in diseases classified elsewhere, multiple sites: Secondary | ICD-10-CM | POA: Diagnosis not present

## 2020-08-03 DIAGNOSIS — M62562 Muscle wasting and atrophy, not elsewhere classified, left lower leg: Secondary | ICD-10-CM | POA: Diagnosis not present

## 2020-08-03 DIAGNOSIS — D508 Other iron deficiency anemias: Secondary | ICD-10-CM | POA: Diagnosis not present

## 2020-08-03 DIAGNOSIS — E876 Hypokalemia: Secondary | ICD-10-CM | POA: Diagnosis not present

## 2020-08-03 DIAGNOSIS — E871 Hypo-osmolality and hyponatremia: Secondary | ICD-10-CM | POA: Diagnosis not present

## 2020-08-03 LAB — BASIC METABOLIC PANEL
BUN: 13 (ref 4–21)
CO2: 27 — AB (ref 13–22)
Chloride: 100 (ref 99–108)
Creatinine: 0.7 (ref 0.5–1.1)
Glucose: 92
Potassium: 4.1 (ref 3.4–5.3)
Sodium: 139 (ref 137–147)

## 2020-08-03 LAB — COMPREHENSIVE METABOLIC PANEL: Calcium: 7.8 — AB (ref 8.7–10.7)

## 2020-08-04 DIAGNOSIS — E876 Hypokalemia: Secondary | ICD-10-CM | POA: Diagnosis not present

## 2020-08-04 DIAGNOSIS — E871 Hypo-osmolality and hyponatremia: Secondary | ICD-10-CM | POA: Diagnosis not present

## 2020-08-04 DIAGNOSIS — M62561 Muscle wasting and atrophy, not elsewhere classified, right lower leg: Secondary | ICD-10-CM | POA: Diagnosis not present

## 2020-08-04 DIAGNOSIS — G9341 Metabolic encephalopathy: Secondary | ICD-10-CM | POA: Diagnosis not present

## 2020-08-04 DIAGNOSIS — R2689 Other abnormalities of gait and mobility: Secondary | ICD-10-CM | POA: Diagnosis not present

## 2020-08-04 DIAGNOSIS — M62562 Muscle wasting and atrophy, not elsewhere classified, left lower leg: Secondary | ICD-10-CM | POA: Diagnosis not present

## 2020-08-04 DIAGNOSIS — M6389 Disorders of muscle in diseases classified elsewhere, multiple sites: Secondary | ICD-10-CM | POA: Diagnosis not present

## 2020-08-04 DIAGNOSIS — I10 Essential (primary) hypertension: Secondary | ICD-10-CM | POA: Diagnosis not present

## 2020-08-04 DIAGNOSIS — R278 Other lack of coordination: Secondary | ICD-10-CM | POA: Diagnosis not present

## 2020-08-05 DIAGNOSIS — E871 Hypo-osmolality and hyponatremia: Secondary | ICD-10-CM | POA: Diagnosis not present

## 2020-08-05 DIAGNOSIS — R278 Other lack of coordination: Secondary | ICD-10-CM | POA: Diagnosis not present

## 2020-08-05 DIAGNOSIS — G9341 Metabolic encephalopathy: Secondary | ICD-10-CM | POA: Diagnosis not present

## 2020-08-05 DIAGNOSIS — M6389 Disorders of muscle in diseases classified elsewhere, multiple sites: Secondary | ICD-10-CM | POA: Diagnosis not present

## 2020-08-06 DIAGNOSIS — R2689 Other abnormalities of gait and mobility: Secondary | ICD-10-CM | POA: Diagnosis not present

## 2020-08-06 DIAGNOSIS — E871 Hypo-osmolality and hyponatremia: Secondary | ICD-10-CM | POA: Diagnosis not present

## 2020-08-06 DIAGNOSIS — G9341 Metabolic encephalopathy: Secondary | ICD-10-CM | POA: Diagnosis not present

## 2020-08-06 DIAGNOSIS — I10 Essential (primary) hypertension: Secondary | ICD-10-CM | POA: Diagnosis not present

## 2020-08-06 DIAGNOSIS — M62562 Muscle wasting and atrophy, not elsewhere classified, left lower leg: Secondary | ICD-10-CM | POA: Diagnosis not present

## 2020-08-06 DIAGNOSIS — E876 Hypokalemia: Secondary | ICD-10-CM | POA: Diagnosis not present

## 2020-08-06 DIAGNOSIS — R278 Other lack of coordination: Secondary | ICD-10-CM | POA: Diagnosis not present

## 2020-08-06 DIAGNOSIS — M62561 Muscle wasting and atrophy, not elsewhere classified, right lower leg: Secondary | ICD-10-CM | POA: Diagnosis not present

## 2020-08-07 DIAGNOSIS — R2689 Other abnormalities of gait and mobility: Secondary | ICD-10-CM | POA: Diagnosis not present

## 2020-08-07 DIAGNOSIS — M62561 Muscle wasting and atrophy, not elsewhere classified, right lower leg: Secondary | ICD-10-CM | POA: Diagnosis not present

## 2020-08-07 DIAGNOSIS — E871 Hypo-osmolality and hyponatremia: Secondary | ICD-10-CM | POA: Diagnosis not present

## 2020-08-07 DIAGNOSIS — E876 Hypokalemia: Secondary | ICD-10-CM | POA: Diagnosis not present

## 2020-08-07 DIAGNOSIS — G9341 Metabolic encephalopathy: Secondary | ICD-10-CM | POA: Diagnosis not present

## 2020-08-07 DIAGNOSIS — I10 Essential (primary) hypertension: Secondary | ICD-10-CM | POA: Diagnosis not present

## 2020-08-07 DIAGNOSIS — R278 Other lack of coordination: Secondary | ICD-10-CM | POA: Diagnosis not present

## 2020-08-07 DIAGNOSIS — M62562 Muscle wasting and atrophy, not elsewhere classified, left lower leg: Secondary | ICD-10-CM | POA: Diagnosis not present

## 2020-08-08 DIAGNOSIS — M62561 Muscle wasting and atrophy, not elsewhere classified, right lower leg: Secondary | ICD-10-CM | POA: Diagnosis not present

## 2020-08-08 DIAGNOSIS — R2689 Other abnormalities of gait and mobility: Secondary | ICD-10-CM | POA: Diagnosis not present

## 2020-08-08 DIAGNOSIS — G9341 Metabolic encephalopathy: Secondary | ICD-10-CM | POA: Diagnosis not present

## 2020-08-08 DIAGNOSIS — E871 Hypo-osmolality and hyponatremia: Secondary | ICD-10-CM | POA: Diagnosis not present

## 2020-08-08 DIAGNOSIS — M62562 Muscle wasting and atrophy, not elsewhere classified, left lower leg: Secondary | ICD-10-CM | POA: Diagnosis not present

## 2020-08-08 DIAGNOSIS — R278 Other lack of coordination: Secondary | ICD-10-CM | POA: Diagnosis not present

## 2020-08-08 DIAGNOSIS — I10 Essential (primary) hypertension: Secondary | ICD-10-CM | POA: Diagnosis not present

## 2020-08-08 DIAGNOSIS — M6389 Disorders of muscle in diseases classified elsewhere, multiple sites: Secondary | ICD-10-CM | POA: Diagnosis not present

## 2020-08-08 DIAGNOSIS — E876 Hypokalemia: Secondary | ICD-10-CM | POA: Diagnosis not present

## 2020-08-09 DIAGNOSIS — M62561 Muscle wasting and atrophy, not elsewhere classified, right lower leg: Secondary | ICD-10-CM | POA: Diagnosis not present

## 2020-08-09 DIAGNOSIS — M6389 Disorders of muscle in diseases classified elsewhere, multiple sites: Secondary | ICD-10-CM | POA: Diagnosis not present

## 2020-08-09 DIAGNOSIS — E876 Hypokalemia: Secondary | ICD-10-CM | POA: Diagnosis not present

## 2020-08-09 DIAGNOSIS — G9341 Metabolic encephalopathy: Secondary | ICD-10-CM | POA: Diagnosis not present

## 2020-08-09 DIAGNOSIS — M62562 Muscle wasting and atrophy, not elsewhere classified, left lower leg: Secondary | ICD-10-CM | POA: Diagnosis not present

## 2020-08-09 DIAGNOSIS — E871 Hypo-osmolality and hyponatremia: Secondary | ICD-10-CM | POA: Diagnosis not present

## 2020-08-09 DIAGNOSIS — I10 Essential (primary) hypertension: Secondary | ICD-10-CM | POA: Diagnosis not present

## 2020-08-09 DIAGNOSIS — R2689 Other abnormalities of gait and mobility: Secondary | ICD-10-CM | POA: Diagnosis not present

## 2020-08-09 DIAGNOSIS — R278 Other lack of coordination: Secondary | ICD-10-CM | POA: Diagnosis not present

## 2020-08-10 ENCOUNTER — Encounter: Payer: Self-pay | Admitting: *Deleted

## 2020-08-10 DIAGNOSIS — M6389 Disorders of muscle in diseases classified elsewhere, multiple sites: Secondary | ICD-10-CM | POA: Diagnosis not present

## 2020-08-10 DIAGNOSIS — R278 Other lack of coordination: Secondary | ICD-10-CM | POA: Diagnosis not present

## 2020-08-10 DIAGNOSIS — E871 Hypo-osmolality and hyponatremia: Secondary | ICD-10-CM | POA: Diagnosis not present

## 2020-08-10 DIAGNOSIS — G9341 Metabolic encephalopathy: Secondary | ICD-10-CM | POA: Diagnosis not present

## 2020-08-11 DIAGNOSIS — M62562 Muscle wasting and atrophy, not elsewhere classified, left lower leg: Secondary | ICD-10-CM | POA: Diagnosis not present

## 2020-08-11 DIAGNOSIS — E871 Hypo-osmolality and hyponatremia: Secondary | ICD-10-CM | POA: Diagnosis not present

## 2020-08-11 DIAGNOSIS — R2689 Other abnormalities of gait and mobility: Secondary | ICD-10-CM | POA: Diagnosis not present

## 2020-08-11 DIAGNOSIS — E876 Hypokalemia: Secondary | ICD-10-CM | POA: Diagnosis not present

## 2020-08-11 DIAGNOSIS — M6389 Disorders of muscle in diseases classified elsewhere, multiple sites: Secondary | ICD-10-CM | POA: Diagnosis not present

## 2020-08-11 DIAGNOSIS — M62561 Muscle wasting and atrophy, not elsewhere classified, right lower leg: Secondary | ICD-10-CM | POA: Diagnosis not present

## 2020-08-11 DIAGNOSIS — G9341 Metabolic encephalopathy: Secondary | ICD-10-CM | POA: Diagnosis not present

## 2020-08-11 DIAGNOSIS — R278 Other lack of coordination: Secondary | ICD-10-CM | POA: Diagnosis not present

## 2020-08-11 DIAGNOSIS — I10 Essential (primary) hypertension: Secondary | ICD-10-CM | POA: Diagnosis not present

## 2020-08-12 DIAGNOSIS — M6389 Disorders of muscle in diseases classified elsewhere, multiple sites: Secondary | ICD-10-CM | POA: Diagnosis not present

## 2020-08-12 DIAGNOSIS — R278 Other lack of coordination: Secondary | ICD-10-CM | POA: Diagnosis not present

## 2020-08-12 DIAGNOSIS — E871 Hypo-osmolality and hyponatremia: Secondary | ICD-10-CM | POA: Diagnosis not present

## 2020-08-12 DIAGNOSIS — G9341 Metabolic encephalopathy: Secondary | ICD-10-CM | POA: Diagnosis not present

## 2020-08-14 DIAGNOSIS — I10 Essential (primary) hypertension: Secondary | ICD-10-CM | POA: Diagnosis not present

## 2020-08-14 DIAGNOSIS — G9341 Metabolic encephalopathy: Secondary | ICD-10-CM | POA: Diagnosis not present

## 2020-08-14 DIAGNOSIS — E876 Hypokalemia: Secondary | ICD-10-CM | POA: Diagnosis not present

## 2020-08-14 DIAGNOSIS — R2689 Other abnormalities of gait and mobility: Secondary | ICD-10-CM | POA: Diagnosis not present

## 2020-08-14 DIAGNOSIS — M62562 Muscle wasting and atrophy, not elsewhere classified, left lower leg: Secondary | ICD-10-CM | POA: Diagnosis not present

## 2020-08-14 DIAGNOSIS — R278 Other lack of coordination: Secondary | ICD-10-CM | POA: Diagnosis not present

## 2020-08-14 DIAGNOSIS — M62561 Muscle wasting and atrophy, not elsewhere classified, right lower leg: Secondary | ICD-10-CM | POA: Diagnosis not present

## 2020-08-14 DIAGNOSIS — E871 Hypo-osmolality and hyponatremia: Secondary | ICD-10-CM | POA: Diagnosis not present

## 2020-08-15 DIAGNOSIS — M62561 Muscle wasting and atrophy, not elsewhere classified, right lower leg: Secondary | ICD-10-CM | POA: Diagnosis not present

## 2020-08-15 DIAGNOSIS — M6389 Disorders of muscle in diseases classified elsewhere, multiple sites: Secondary | ICD-10-CM | POA: Diagnosis not present

## 2020-08-15 DIAGNOSIS — M62562 Muscle wasting and atrophy, not elsewhere classified, left lower leg: Secondary | ICD-10-CM | POA: Diagnosis not present

## 2020-08-15 DIAGNOSIS — E871 Hypo-osmolality and hyponatremia: Secondary | ICD-10-CM | POA: Diagnosis not present

## 2020-08-15 DIAGNOSIS — R2689 Other abnormalities of gait and mobility: Secondary | ICD-10-CM | POA: Diagnosis not present

## 2020-08-15 DIAGNOSIS — G9341 Metabolic encephalopathy: Secondary | ICD-10-CM | POA: Diagnosis not present

## 2020-08-15 DIAGNOSIS — R278 Other lack of coordination: Secondary | ICD-10-CM | POA: Diagnosis not present

## 2020-08-15 DIAGNOSIS — I10 Essential (primary) hypertension: Secondary | ICD-10-CM | POA: Diagnosis not present

## 2020-08-15 DIAGNOSIS — E876 Hypokalemia: Secondary | ICD-10-CM | POA: Diagnosis not present

## 2020-08-16 DIAGNOSIS — G9341 Metabolic encephalopathy: Secondary | ICD-10-CM | POA: Diagnosis not present

## 2020-08-16 DIAGNOSIS — M6389 Disorders of muscle in diseases classified elsewhere, multiple sites: Secondary | ICD-10-CM | POA: Diagnosis not present

## 2020-08-16 DIAGNOSIS — E876 Hypokalemia: Secondary | ICD-10-CM | POA: Diagnosis not present

## 2020-08-16 DIAGNOSIS — E871 Hypo-osmolality and hyponatremia: Secondary | ICD-10-CM | POA: Diagnosis not present

## 2020-08-16 DIAGNOSIS — R2689 Other abnormalities of gait and mobility: Secondary | ICD-10-CM | POA: Diagnosis not present

## 2020-08-16 DIAGNOSIS — M62561 Muscle wasting and atrophy, not elsewhere classified, right lower leg: Secondary | ICD-10-CM | POA: Diagnosis not present

## 2020-08-16 DIAGNOSIS — R278 Other lack of coordination: Secondary | ICD-10-CM | POA: Diagnosis not present

## 2020-08-16 DIAGNOSIS — M62562 Muscle wasting and atrophy, not elsewhere classified, left lower leg: Secondary | ICD-10-CM | POA: Diagnosis not present

## 2020-08-16 DIAGNOSIS — I10 Essential (primary) hypertension: Secondary | ICD-10-CM | POA: Diagnosis not present

## 2020-08-17 DIAGNOSIS — M62561 Muscle wasting and atrophy, not elsewhere classified, right lower leg: Secondary | ICD-10-CM | POA: Diagnosis not present

## 2020-08-17 DIAGNOSIS — I10 Essential (primary) hypertension: Secondary | ICD-10-CM | POA: Diagnosis not present

## 2020-08-17 DIAGNOSIS — E871 Hypo-osmolality and hyponatremia: Secondary | ICD-10-CM | POA: Diagnosis not present

## 2020-08-17 DIAGNOSIS — R2689 Other abnormalities of gait and mobility: Secondary | ICD-10-CM | POA: Diagnosis not present

## 2020-08-17 DIAGNOSIS — R278 Other lack of coordination: Secondary | ICD-10-CM | POA: Diagnosis not present

## 2020-08-17 DIAGNOSIS — M6389 Disorders of muscle in diseases classified elsewhere, multiple sites: Secondary | ICD-10-CM | POA: Diagnosis not present

## 2020-08-17 DIAGNOSIS — G9341 Metabolic encephalopathy: Secondary | ICD-10-CM | POA: Diagnosis not present

## 2020-08-17 DIAGNOSIS — M62562 Muscle wasting and atrophy, not elsewhere classified, left lower leg: Secondary | ICD-10-CM | POA: Diagnosis not present

## 2020-08-17 DIAGNOSIS — E876 Hypokalemia: Secondary | ICD-10-CM | POA: Diagnosis not present

## 2020-08-18 ENCOUNTER — Encounter: Payer: Self-pay | Admitting: Adult Health

## 2020-08-18 ENCOUNTER — Non-Acute Institutional Stay (SKILLED_NURSING_FACILITY): Payer: Medicare Other | Admitting: Adult Health

## 2020-08-18 DIAGNOSIS — I10 Essential (primary) hypertension: Secondary | ICD-10-CM | POA: Diagnosis not present

## 2020-08-18 DIAGNOSIS — M62562 Muscle wasting and atrophy, not elsewhere classified, left lower leg: Secondary | ICD-10-CM | POA: Diagnosis not present

## 2020-08-18 DIAGNOSIS — L89302 Pressure ulcer of unspecified buttock, stage 2: Secondary | ICD-10-CM

## 2020-08-18 DIAGNOSIS — G9341 Metabolic encephalopathy: Secondary | ICD-10-CM | POA: Diagnosis not present

## 2020-08-18 DIAGNOSIS — M62561 Muscle wasting and atrophy, not elsewhere classified, right lower leg: Secondary | ICD-10-CM | POA: Diagnosis not present

## 2020-08-18 DIAGNOSIS — G40209 Localization-related (focal) (partial) symptomatic epilepsy and epileptic syndromes with complex partial seizures, not intractable, without status epilepticus: Secondary | ICD-10-CM | POA: Diagnosis not present

## 2020-08-18 DIAGNOSIS — M6389 Disorders of muscle in diseases classified elsewhere, multiple sites: Secondary | ICD-10-CM | POA: Diagnosis not present

## 2020-08-18 DIAGNOSIS — E876 Hypokalemia: Secondary | ICD-10-CM | POA: Diagnosis not present

## 2020-08-18 DIAGNOSIS — E871 Hypo-osmolality and hyponatremia: Secondary | ICD-10-CM | POA: Diagnosis not present

## 2020-08-18 DIAGNOSIS — R531 Weakness: Secondary | ICD-10-CM

## 2020-08-18 DIAGNOSIS — D508 Other iron deficiency anemias: Secondary | ICD-10-CM | POA: Diagnosis not present

## 2020-08-18 DIAGNOSIS — R2689 Other abnormalities of gait and mobility: Secondary | ICD-10-CM | POA: Diagnosis not present

## 2020-08-18 DIAGNOSIS — R278 Other lack of coordination: Secondary | ICD-10-CM | POA: Diagnosis not present

## 2020-08-18 NOTE — Progress Notes (Signed)
Location:  [facility] of Service:  SNF (31)  Provider:  Cindi Carbon, ANP [facility] [phone]   PCP: Burnard Bunting, MD Patient Care Team: Burnard Bunting, MD as PCP - General (Internal Medicine)  Extended Emergency Contact Information Primary Emergency Contact: [place] Mobile Phone: [phone] Relation: Daughter Secondary Emergency Contact: ALEJAH, ARISTIZABAL Mobile Phone: [phone] Relation: Son  Code Status: DNR Goals of care:  Advanced Directive information Advanced Directives 07/27/2020  Does Patient Have a Medical Advance Directive? Yes  Type of Paramedic of [place];Living will  Does patient want to make changes to medical advance directive? -  Copy of [place] in Chart? -     Allergies  Allergen Reactions  . Azithromycin Other (See Comments)    unknown  . Naproxen Sodium     REACTION: rash  . Penicillins     REACTION: hives  . Pravastatin   . Sulfa Drugs Cross Reactors     Chief Complaint  Patient presents with  . Discharge Note    HPI:  85 y.o. female seen for discharge from [facility] [place] s/p hospitalization for hyponatremia, diarrhea, confusion, and weakness (07/21/20-07/25/20).  She had a two week hx of non bloody diarrhea without fever. Sodium on admission was 118, K 3.0, serum osmo 252, Mg 0.8.  She was given IVF and supplementation. This was felt to be due to volume loss. She was discharged on salt tablets for 1 more week. NA has been maintained, last check 139 08/03/20.  K 4.1.    The diarrhea resolved with no further issues.   She has been working with therapy due to weakness and is now ambulating with a walker independently. She is now alert and oriented. Independent in dressing and bathing.   BP slightly elevated at 158/64.  She is wearing compression hose for edema which helps.   She has a hx of seizures since she was 85 years old.  She has been on the same meds for years with no seizures or issues. 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 Her dilantin and phenobarb levels have ran low but she has not had any issues with seizures.   During her hospitalization her hgb dropped and she was found to be iron deficient and started on ferrous sulfate.  Was 8.3 2/13 Lab Results  Component Value Date   HGB 8.9 (A) 07/27/2020   Lab Results  Component Value Date   IRON 9 (L) 07/24/2020   TIBC 122 (L) 07/24/2020   FERRITIN 236 07/24/2020         Past Medical History:  Diagnosis Date  . Abnormal cardiovascular stress test   . Carotid arterial disease (HCC)    MODERATE RIGHT  . Cerebral AV malformation   . DDD (degenerative disc disease)    SEVERE WITH SPINAL STENOSIS  . Hypercholesterolemia   . Hypertension   . MVP (mitral valve prolapse)    MILD  . Numbness    LEFT FOOT AND ANKLE  . Osteopenia   . Pseudo-gout     Past Surgical History:  Procedure Laterality Date  . APPENDECTOMY    . BREAST MASS EXCISION    . CARPAL TUNNEL RELEASE    . REMOVAL OF PARATHYROID GLAND    . STRESS NUCLEAR STUDY     ABNORMAL  . TONSILLECTOMY        reports that she quit smoking about  51 years ago. Her smoking use included cigarettes. She has a 14.00 pack-year smoking history. She has never used smokeless tobacco. She reports that she does not drink alcohol and does not use drugs. Social History   Socioeconomic History  . Marital status: Married    Spouse name: Not on file  . Number of children: 3  . Years of education: Not on file  . Highest education level: Not on file  Occupational History  . Not on file  Tobacco Use  . Smoking status: Former Smoker    Packs/day: 1.00    Years: 14.00    Pack years: 14.00    Types: Cigarettes    Quit date: 11/07/1968    Years since quitting: 51.8  . Smokeless tobacco: Never Used  Substance and Sexual Activity  . Alcohol use: No   . Drug use: No  . Sexual activity: Not on file  Other Topics Concern  . Not on file  Social History Narrative   [place] garden home at [place] Strain: Not on file  Food Insecurity: Not on file  Transportation Needs: Not on file  Physical Activity: Not on file  Stress: Not on file  Social Connections: Not on file  Intimate Partner Violence: Not on file   Functional Status Survey:    Allergies  Allergen Reactions  . Azithromycin Other (See Comments)    unknown  . Naproxen Sodium     REACTION: rash  . Penicillins     REACTION: hives  . Pravastatin   . Sulfa Drugs Cross Reactors     Pertinent  Health Maintenance Due  Topic Date Due  . DEXA SCAN  Never done  . PNA vac Low Risk Adult (1 of 2 - PCV13) Never done  . INFLUENZA VACCINE  Completed    Medications: Outpatient Encounter Medications as of 08/18/2020  Medication Sig  . carvedilol (COREG) 25 MG tablet Take 1 tablet (25 mg total) by mouth 2 (two) times daily.  . ferrous sulfate 325 (65 FE) MG tablet Take 1 tablet (325 mg total) by mouth 2 (two) times daily with a meal.  . loperamide (IMODIUM) 2 MG capsule Take 1 capsule (2 mg total) by mouth every 6 (six) hours as needed for diarrhea or loose stools.  Marland Kitchen PHENobarbital (LUMINAL) 32.4 MG tablet Take 64.8 mg by mouth daily.  . phenytoin (DILANTIN) 100 MG ER capsule Take 200 mg by mouth daily.  . simvastatin (ZOCOR) 20 MG tablet Take 20 mg by mouth at bedtime.   No facility-administered encounter medications on file as of 08/18/2020.    Review of Systems  Constitutional: Negative for activity change, appetite change, chills, diaphoresis, fatigue, fever and unexpected weight change.  HENT: Negative for congestion.   Respiratory: Negative for cough, shortness of breath and wheezing.   Cardiovascular: Positive for leg swelling. Negative for chest pain and palpitations.  Gastrointestinal: Negative for abdominal  distention, abdominal pain, constipation and diarrhea.  Genitourinary: Negative for difficulty urinating and dysuria.  Musculoskeletal: Positive for gait problem. Negative for arthralgias, back pain, joint swelling and myalgias.  Skin: Positive for wound.  Neurological: Negative for dizziness, tremors, seizures, syncope, facial asymmetry, speech difficulty, weakness, light-headedness, numbness and headaches.  Psychiatric/Behavioral: Negative for agitation, behavioral problems and confusion.    Vitals:   08/18/20 1607  BP: (!) 158/64  Pulse: 84  Resp: 16  Temp: 98.6 F (37 C)  SpO2: 98%   There is no  height or weight on file to calculate BMI. Physical Exam Vitals and nursing note reviewed.  Constitutional:      General: She is not in acute distress.    Appearance: She is not diaphoretic.  HENT:     Head: Normocephalic and atraumatic.  Neck:     Vascular: No JVD.  Cardiovascular:     Rate and Rhythm: Normal rate and regular rhythm.     Heart sounds: No murmur heard.   Pulmonary:     Effort: Pulmonary effort is normal. No respiratory distress.     Breath sounds: Normal breath sounds. No wheezing.  Abdominal:     General: Bowel sounds are normal. There is no distension.     Palpations: Abdomen is soft.     Tenderness: There is no abdominal tenderness.  Skin:    General: Skin is warm and dry.     Comments: Trace edema to BLE non pitting  Neurological:     Mental Status: She is alert and oriented to person, place, and time.  Psychiatric:        Mood and Affect: Mood normal.     Labs reviewed: Basic Metabolic Panel: Recent Labs    07/22/20 0804 07/22/20 1208 07/23/20 0442 07/24/20 0414 07/25/20 0203 08/03/20 0000  NA 121*   < > 127* 127* 128* 139  K 3.7   < > 3.1* 3.8 3.5 4.1  CL 90*   < > 95* 98 99 100  CO2 23   < > 23 21* 20* 27*  GLUCOSE 124*   < > 116* 115* 114*  --   BUN 7*   < > 8 14 23 13   CREATININE 0.71   < > 0.85 0.95 1.09* 0.7  CALCIUM 7.6*   < >  7.6* 7.7* 7.8* 7.8*  MG 1.6*  --  1.4*  --  1.7  --   PHOS 2.3*  --  2.6  --  3.4  --    < > = values in this interval not displayed.   Liver Function Tests: Recent Labs    07/19/20 0000 07/21/20 1745  AST 30 31  ALT 14 16  ALKPHOS  --  56  BILITOT  --  1.0  PROT  --  6.6  ALBUMIN 3.6 3.5   No results for input(s): LIPASE, AMYLASE in the last 8760 hours. No results for input(s): AMMONIA in the last 8760 hours. CBC: Recent Labs    07/23/20 0442 07/24/20 0414 07/25/20 0203 07/27/20 0000  WBC 10.3 8.4 6.8 4.3  NEUTROABS 8.2* 6.5 5.0  --   HGB 9.2* 8.3* 7.7* 8.9*  HCT 26.0* 24.2* 23.3* 26*  MCV 96.7 98.8 103.1*  --   PLT 165 154 169 242   Cardiac Enzymes: No results for input(s): CKTOTAL, CKMB, CKMBINDEX, TROPONINI in the last 8760 hours. BNP: Invalid input(s): POCBNP CBG: No results for input(s): GLUCAP in the last 8760 hours.  Procedures and Imaging Studies During Stay: No results found.  Assessment/Plan:   1. Hyponatremia Resolved, was due to excess losses due to diarrhea   2. Weakness Improved with therapy Ready for discharge home Will have home care three times a week for assistance but is mostly independent  3. Other iron deficiency anemia Improved  No acute blood loss noted.  If not improving may need more work up Continue ferrous sulfate 325 mg bid and f/u with Dr. Reynaldo Minium  4. Essential hypertension Slightly elevated but due to her age and fall risk will hold  off on changing meds for now Needs to f/u with Dr A in 2-4 weeks   5. Pressure injury of buttock, stage 2, unspecified laterality ([place]) Improved. Continue dressing changes per [facility] twice weekly and as needed   6. Partial symptomatic epilepsy with complex partial seizures, not intractable, without status epilepticus ([place]) No current issues.  Dilantin and phenobarb levels were slightly low May need recheck.     Patient is being discharged with the following home health services:   Wound care nurse  Patient is being discharged with the following durable medical equipment:  Has walker   Patient has been advised to f/u with their PCP in 1-2 weeks to for a transitions of care visit.  Social services at their facility was responsible for arranging this appointment.  Pt was provided with adequate prescriptions of noncontrolled medications to reach the scheduled appointment .  For controlled substances, a limited supply was provided as appropriate for the individual patient.  If the pt normally receives these medications from a pain clinic or has a contract with another physician, these medications should be received from that clinic or physician only).    Future labs/tests needed:  CBC BMP Dilantin and phenobarb levels

## 2020-08-19 DIAGNOSIS — M6389 Disorders of muscle in diseases classified elsewhere, multiple sites: Secondary | ICD-10-CM | POA: Diagnosis not present

## 2020-08-19 DIAGNOSIS — R278 Other lack of coordination: Secondary | ICD-10-CM | POA: Diagnosis not present

## 2020-08-19 DIAGNOSIS — G9341 Metabolic encephalopathy: Secondary | ICD-10-CM | POA: Diagnosis not present

## 2020-08-19 DIAGNOSIS — E871 Hypo-osmolality and hyponatremia: Secondary | ICD-10-CM | POA: Diagnosis not present

## 2020-08-22 DIAGNOSIS — I10 Essential (primary) hypertension: Secondary | ICD-10-CM | POA: Diagnosis not present

## 2020-08-22 DIAGNOSIS — G9341 Metabolic encephalopathy: Secondary | ICD-10-CM | POA: Diagnosis not present

## 2020-08-22 DIAGNOSIS — E871 Hypo-osmolality and hyponatremia: Secondary | ICD-10-CM | POA: Diagnosis not present

## 2020-08-22 DIAGNOSIS — M62562 Muscle wasting and atrophy, not elsewhere classified, left lower leg: Secondary | ICD-10-CM | POA: Diagnosis not present

## 2020-08-22 DIAGNOSIS — R2689 Other abnormalities of gait and mobility: Secondary | ICD-10-CM | POA: Diagnosis not present

## 2020-08-22 DIAGNOSIS — M62561 Muscle wasting and atrophy, not elsewhere classified, right lower leg: Secondary | ICD-10-CM | POA: Diagnosis not present

## 2020-08-22 DIAGNOSIS — E876 Hypokalemia: Secondary | ICD-10-CM | POA: Diagnosis not present

## 2020-08-22 DIAGNOSIS — M6389 Disorders of muscle in diseases classified elsewhere, multiple sites: Secondary | ICD-10-CM | POA: Diagnosis not present

## 2020-08-22 DIAGNOSIS — R278 Other lack of coordination: Secondary | ICD-10-CM | POA: Diagnosis not present

## 2020-08-24 DIAGNOSIS — R278 Other lack of coordination: Secondary | ICD-10-CM | POA: Diagnosis not present

## 2020-08-24 DIAGNOSIS — M6389 Disorders of muscle in diseases classified elsewhere, multiple sites: Secondary | ICD-10-CM | POA: Diagnosis not present

## 2020-08-24 DIAGNOSIS — G40909 Epilepsy, unspecified, not intractable, without status epilepticus: Secondary | ICD-10-CM | POA: Diagnosis not present

## 2020-08-24 DIAGNOSIS — I10 Essential (primary) hypertension: Secondary | ICD-10-CM | POA: Diagnosis not present

## 2020-08-24 DIAGNOSIS — D509 Iron deficiency anemia, unspecified: Secondary | ICD-10-CM | POA: Diagnosis not present

## 2020-08-24 DIAGNOSIS — R197 Diarrhea, unspecified: Secondary | ICD-10-CM | POA: Diagnosis not present

## 2020-08-24 DIAGNOSIS — E871 Hypo-osmolality and hyponatremia: Secondary | ICD-10-CM | POA: Diagnosis not present

## 2020-08-24 DIAGNOSIS — L89300 Pressure ulcer of unspecified buttock, unstageable: Secondary | ICD-10-CM | POA: Diagnosis not present

## 2020-08-24 DIAGNOSIS — D649 Anemia, unspecified: Secondary | ICD-10-CM | POA: Diagnosis not present

## 2020-08-24 DIAGNOSIS — E785 Hyperlipidemia, unspecified: Secondary | ICD-10-CM | POA: Diagnosis not present

## 2020-08-24 DIAGNOSIS — G9341 Metabolic encephalopathy: Secondary | ICD-10-CM | POA: Diagnosis not present

## 2020-08-25 DIAGNOSIS — M62561 Muscle wasting and atrophy, not elsewhere classified, right lower leg: Secondary | ICD-10-CM | POA: Diagnosis not present

## 2020-08-25 DIAGNOSIS — M62562 Muscle wasting and atrophy, not elsewhere classified, left lower leg: Secondary | ICD-10-CM | POA: Diagnosis not present

## 2020-08-25 DIAGNOSIS — R278 Other lack of coordination: Secondary | ICD-10-CM | POA: Diagnosis not present

## 2020-08-25 DIAGNOSIS — R2689 Other abnormalities of gait and mobility: Secondary | ICD-10-CM | POA: Diagnosis not present

## 2020-08-25 DIAGNOSIS — I10 Essential (primary) hypertension: Secondary | ICD-10-CM | POA: Diagnosis not present

## 2020-08-25 DIAGNOSIS — E871 Hypo-osmolality and hyponatremia: Secondary | ICD-10-CM | POA: Diagnosis not present

## 2020-08-25 DIAGNOSIS — G9341 Metabolic encephalopathy: Secondary | ICD-10-CM | POA: Diagnosis not present

## 2020-08-25 DIAGNOSIS — M6389 Disorders of muscle in diseases classified elsewhere, multiple sites: Secondary | ICD-10-CM | POA: Diagnosis not present

## 2020-08-25 DIAGNOSIS — E876 Hypokalemia: Secondary | ICD-10-CM | POA: Diagnosis not present

## 2020-08-31 DIAGNOSIS — E871 Hypo-osmolality and hyponatremia: Secondary | ICD-10-CM | POA: Diagnosis not present

## 2020-08-31 DIAGNOSIS — M6389 Disorders of muscle in diseases classified elsewhere, multiple sites: Secondary | ICD-10-CM | POA: Diagnosis not present

## 2020-08-31 DIAGNOSIS — G9341 Metabolic encephalopathy: Secondary | ICD-10-CM | POA: Diagnosis not present

## 2020-08-31 DIAGNOSIS — R278 Other lack of coordination: Secondary | ICD-10-CM | POA: Diagnosis not present

## 2020-09-02 DIAGNOSIS — G9341 Metabolic encephalopathy: Secondary | ICD-10-CM | POA: Diagnosis not present

## 2020-09-02 DIAGNOSIS — M6389 Disorders of muscle in diseases classified elsewhere, multiple sites: Secondary | ICD-10-CM | POA: Diagnosis not present

## 2020-09-02 DIAGNOSIS — E871 Hypo-osmolality and hyponatremia: Secondary | ICD-10-CM | POA: Diagnosis not present

## 2020-09-02 DIAGNOSIS — R278 Other lack of coordination: Secondary | ICD-10-CM | POA: Diagnosis not present

## 2020-09-09 DIAGNOSIS — E871 Hypo-osmolality and hyponatremia: Secondary | ICD-10-CM | POA: Diagnosis not present

## 2020-09-09 DIAGNOSIS — G9341 Metabolic encephalopathy: Secondary | ICD-10-CM | POA: Diagnosis not present

## 2020-09-09 DIAGNOSIS — M6389 Disorders of muscle in diseases classified elsewhere, multiple sites: Secondary | ICD-10-CM | POA: Diagnosis not present

## 2020-09-09 DIAGNOSIS — R278 Other lack of coordination: Secondary | ICD-10-CM | POA: Diagnosis not present

## 2020-09-13 DIAGNOSIS — G9341 Metabolic encephalopathy: Secondary | ICD-10-CM | POA: Diagnosis not present

## 2020-09-13 DIAGNOSIS — M6389 Disorders of muscle in diseases classified elsewhere, multiple sites: Secondary | ICD-10-CM | POA: Diagnosis not present

## 2020-09-13 DIAGNOSIS — E871 Hypo-osmolality and hyponatremia: Secondary | ICD-10-CM | POA: Diagnosis not present

## 2020-09-13 DIAGNOSIS — R278 Other lack of coordination: Secondary | ICD-10-CM | POA: Diagnosis not present

## 2020-09-15 DIAGNOSIS — R278 Other lack of coordination: Secondary | ICD-10-CM | POA: Diagnosis not present

## 2020-09-15 DIAGNOSIS — M6389 Disorders of muscle in diseases classified elsewhere, multiple sites: Secondary | ICD-10-CM | POA: Diagnosis not present

## 2020-09-15 DIAGNOSIS — E871 Hypo-osmolality and hyponatremia: Secondary | ICD-10-CM | POA: Diagnosis not present

## 2020-09-15 DIAGNOSIS — G9341 Metabolic encephalopathy: Secondary | ICD-10-CM | POA: Diagnosis not present

## 2020-10-05 DIAGNOSIS — E785 Hyperlipidemia, unspecified: Secondary | ICD-10-CM | POA: Diagnosis not present

## 2020-11-22 ENCOUNTER — Other Ambulatory Visit: Payer: Self-pay | Admitting: Cardiology

## 2020-11-22 DIAGNOSIS — I10 Essential (primary) hypertension: Secondary | ICD-10-CM

## 2020-11-30 DIAGNOSIS — E785 Hyperlipidemia, unspecified: Secondary | ICD-10-CM | POA: Diagnosis not present

## 2020-11-30 DIAGNOSIS — I10 Essential (primary) hypertension: Secondary | ICD-10-CM | POA: Diagnosis not present

## 2020-11-30 DIAGNOSIS — J302 Other seasonal allergic rhinitis: Secondary | ICD-10-CM | POA: Diagnosis not present

## 2020-11-30 DIAGNOSIS — R569 Unspecified convulsions: Secondary | ICD-10-CM | POA: Diagnosis not present

## 2020-11-30 DIAGNOSIS — K429 Umbilical hernia without obstruction or gangrene: Secondary | ICD-10-CM | POA: Diagnosis not present

## 2020-11-30 DIAGNOSIS — I739 Peripheral vascular disease, unspecified: Secondary | ICD-10-CM | POA: Diagnosis not present

## 2020-11-30 DIAGNOSIS — E042 Nontoxic multinodular goiter: Secondary | ICD-10-CM | POA: Diagnosis not present

## 2020-11-30 DIAGNOSIS — G629 Polyneuropathy, unspecified: Secondary | ICD-10-CM | POA: Diagnosis not present

## 2020-11-30 DIAGNOSIS — R32 Unspecified urinary incontinence: Secondary | ICD-10-CM | POA: Diagnosis not present

## 2020-11-30 DIAGNOSIS — Z79899 Other long term (current) drug therapy: Secondary | ICD-10-CM | POA: Diagnosis not present

## 2020-12-26 DIAGNOSIS — Z961 Presence of intraocular lens: Secondary | ICD-10-CM | POA: Diagnosis not present

## 2020-12-26 DIAGNOSIS — H52203 Unspecified astigmatism, bilateral: Secondary | ICD-10-CM | POA: Diagnosis not present

## 2021-01-05 ENCOUNTER — Ambulatory Visit (HOSPITAL_COMMUNITY)
Admission: RE | Admit: 2021-01-05 | Discharge: 2021-01-05 | Disposition: A | Discharge status: Home / Self Care | Payer: Medicare Other | Source: Ambulatory Visit | Attending: Cardiology | Admitting: Cardiology

## 2021-01-05 ENCOUNTER — Other Ambulatory Visit: Payer: Self-pay

## 2021-01-05 DIAGNOSIS — E7800 Pure hypercholesterolemia, unspecified: Secondary | ICD-10-CM

## 2021-01-05 DIAGNOSIS — I10 Essential (primary) hypertension: Secondary | ICD-10-CM

## 2021-01-05 DIAGNOSIS — I6523 Occlusion and stenosis of bilateral carotid arteries: Secondary | ICD-10-CM

## 2021-01-20 DIAGNOSIS — E785 Hyperlipidemia, unspecified: Secondary | ICD-10-CM | POA: Diagnosis not present

## 2021-02-03 NOTE — Progress Notes (Signed)
[name] Date of Birth: 06/26/1935   History of Present Illness: [name] is seen for yearly followup. She has a history of mildly abnormal myoview study in 2010. She has HTN. She has been treated conservatively and has done well.    She is now living at [facility] She is widowed. She does have neuropathy in her legs. Walks with a cane or rollator. Had LE arterial dopplers in April 2021which were normal. Notes she is not on ASA due to history of cerebral AV malformation she has had since age 41.   She was admitted in Feb 2022 with syncope, confusion and hyponatremia following a diarrheal illness. Responded to hydration. Also had iron deficient anemia  Recent carotid dopplers in July unchanged from prior 40-59% bilateral ICA stenosis.   Current Outpatient Medications on File Prior to Visit  Medication Sig Dispense Refill   carvedilol (COREG) 25 MG tablet TAKE 1 TABLET BY MOUTH TWICE DAILY. 180 tablet 2   ferrous sulfate 325 (65 FE) MG tablet Take 1 tablet (325 mg total) by mouth 2 (two) times daily with a meal.  3   loperamide (IMODIUM) 2 MG capsule Take 1 capsule (2 mg total) by mouth every 6 (six) hours as needed for diarrhea or loose stools. 30 capsule 0   PHENobarbital (LUMINAL) 32.4 MG tablet Take 64.8 mg by mouth daily.     phenytoin (DILANTIN) 100 MG ER capsule Take 200 mg by mouth daily.     simvastatin (ZOCOR) 20 MG tablet Take 20 mg by mouth at bedtime.     No current facility-administered medications on file prior to visit.    Allergies  Allergen Reactions   Azithromycin Other (See Comments)    unknown   Naproxen Sodium     REACTION: rash   Penicillins     REACTION: hives   Pravastatin    Sulfa Drugs Cross Reactors     Past Medical History:  Diagnosis Date   Abnormal cardiovascular stress test    Carotid arterial disease (HCC)    MODERATE RIGHT   Cerebral AV malformation    DDD (degenerative disc disease)    SEVERE WITH SPINAL STENOSIS    Hypercholesterolemia    Hypertension    MVP (mitral valve prolapse)    MILD   Numbness    LEFT FOOT AND ANKLE   Osteopenia    Pseudo-gout     Past Surgical History:  Procedure Laterality Date   APPENDECTOMY     BREAST MASS EXCISION     CARPAL TUNNEL RELEASE     REMOVAL OF PARATHYROID GLAND     STRESS NUCLEAR STUDY     ABNORMAL   TONSILLECTOMY      Social History   Tobacco Use  Smoking Status Former   Packs/day: 1.00   Years: 14.00   Pack years: 14.00   Types: Cigarettes   Quit date: 11/07/1968   Years since quitting: 52.2  Smokeless Tobacco Never    Social History   Substance and Sexual Activity  Alcohol Use No    Family History  Problem Relation Age of Onset   Stroke Mother    Lung cancer Father    Breast cancer Neg Hx     Review of Systems: As the history of present illness.  All other systems were reviewed and are negative.  Physical Exam: There were no vitals taken for this visit. GENERAL:  Well appearing elderly WF in NAD HEENT:  PERRL, EOMI, sclera are clear. Oropharynx is  clear. NECK:  No jugular venous distention, carotids with bilateral bruits, no thyromegaly or adenopathy LUNGS:  Clear to auscultation bilaterally CHEST:  Unremarkable HEART:  RRR,  PMI not displaced or sustained,S1 and S2 within normal limits, no S3, no S4: no clicks, no rubs, no murmurs ABD:  Soft, nontender. BS +, no masses or bruits. No hepatomegaly, no splenomegaly EXT:  2 + pulses throughout, no edema, no cyanosis no clubbing SKIN:  Warm and dry.  No rashes NEURO:  Alert and oriented x 3. Cranial nerves II through XII intact. PSYCH:  Cognitively intact    LABORATORY DATA: Lab Results  Component Value Date   WBC 4.3 07/27/2020   HGB 8.9 (A) 07/27/2020   HCT 26 (A) 07/27/2020   PLT 242 07/27/2020   GLUCOSE 114 (H) 07/25/2020   ALT 16 07/21/2020   AST 31 07/21/2020   NA 139 08/03/2020   K 4.1 08/03/2020   CL 100 08/03/2020   CREATININE 0.7 08/03/2020   BUN 13  08/03/2020   CO2 27 (A) 08/03/2020   TSH 1.685 07/21/2020    Labs dated 12/23/15: Cholesterol 204, triglycerides 43, HDL 85, LDL 110. CMET and TSH normal. Hgb 10.7. Dated 01/14/17: cholesterol 198, triglycerides 42, HDL 86, LDL 104. Hgb 10.1. Chemistries and TSH normal. Dated 01/15/18: cholesterol 223, triglycerides 52, HDL 97, LDL 116. Hgb 10.1. CMET and TSH normal Dated 02/13/19: cholesterol 214, triglycerides 55, HDL 87, LDL 116.  Dated 09/09/19: Hgb 10.1. CMET and TSH normal. Dated 02/17/20: cholesterol 227, triglycerides 62, HDL 93, LDL 122.  Dated 07/27/20: Hgb 8.9, iron deficient. Dated 11/30/20: Normal CMET and TSH.  Carotid dopplers 01/05/21 : 40-59% RICA stenosis. [id number] LICA stenosis. Bilateral > 50% ECA stenosis.    Assessment / Plan: 1. Hypertension-blood pressure is well controlled.  Continue current Coreg  2.  Hyperlipidemia. On simvastatin. Labs followed by Dr Reynaldo Minium.   3. Mildly abnormal stress test in 2010. Unclear if this is artifact. Patient has remained asymptomatic. We'll continue risk factor modification.  4. Carotid bruits. Modest carotid disease.  She is asymptomatic. Avoid antiplatelet therapy due to cerebral AVM. Repeat dopplers in one year.   5. Thyroid nodules. Felt to be benign  6. Hyponatremia resolved.   7. Anemia. Was prescribed  iron supplement but she doesn't think she is on it. Will discuss with PCP  Follow up in one year.

## 2021-02-07 ENCOUNTER — Ambulatory Visit (INDEPENDENT_AMBULATORY_CARE_PROVIDER_SITE_OTHER): Payer: Medicare Other | Admitting: Cardiology

## 2021-02-07 ENCOUNTER — Other Ambulatory Visit: Payer: Self-pay

## 2021-02-07 ENCOUNTER — Encounter: Payer: Self-pay | Admitting: Cardiology

## 2021-02-07 VITALS — BP 128/56 | HR 76 | Resp 20 | Ht 65.0 in | Wt 157.4 lb

## 2021-02-07 DIAGNOSIS — I6523 Occlusion and stenosis of bilateral carotid arteries: Secondary | ICD-10-CM

## 2021-02-07 DIAGNOSIS — I10 Essential (primary) hypertension: Secondary | ICD-10-CM | POA: Diagnosis not present

## 2021-02-07 DIAGNOSIS — E7800 Pure hypercholesterolemia, unspecified: Secondary | ICD-10-CM | POA: Diagnosis not present

## 2021-02-20 DIAGNOSIS — E042 Nontoxic multinodular goiter: Secondary | ICD-10-CM | POA: Diagnosis not present

## 2021-02-20 DIAGNOSIS — E785 Hyperlipidemia, unspecified: Secondary | ICD-10-CM | POA: Diagnosis not present

## 2021-02-20 DIAGNOSIS — M859 Disorder of bone density and structure, unspecified: Secondary | ICD-10-CM | POA: Diagnosis not present

## 2021-03-15 DIAGNOSIS — R82998 Other abnormal findings in urine: Secondary | ICD-10-CM | POA: Diagnosis not present

## 2021-03-15 DIAGNOSIS — Z1212 Encounter for screening for malignant neoplasm of rectum: Secondary | ICD-10-CM | POA: Diagnosis not present

## 2021-03-15 DIAGNOSIS — M1990 Unspecified osteoarthritis, unspecified site: Secondary | ICD-10-CM | POA: Diagnosis not present

## 2021-03-15 DIAGNOSIS — E042 Nontoxic multinodular goiter: Secondary | ICD-10-CM | POA: Diagnosis not present

## 2021-03-15 DIAGNOSIS — D649 Anemia, unspecified: Secondary | ICD-10-CM | POA: Diagnosis not present

## 2021-03-15 DIAGNOSIS — E785 Hyperlipidemia, unspecified: Secondary | ICD-10-CM | POA: Diagnosis not present

## 2021-03-15 DIAGNOSIS — Z1339 Encounter for screening examination for other mental health and behavioral disorders: Secondary | ICD-10-CM | POA: Diagnosis not present

## 2021-03-15 DIAGNOSIS — K429 Umbilical hernia without obstruction or gangrene: Secondary | ICD-10-CM | POA: Diagnosis not present

## 2021-03-15 DIAGNOSIS — G629 Polyneuropathy, unspecified: Secondary | ICD-10-CM | POA: Diagnosis not present

## 2021-03-15 DIAGNOSIS — Z0000 Encounter for general adult medical examination without abnormal findings: Secondary | ICD-10-CM | POA: Diagnosis not present

## 2021-03-15 DIAGNOSIS — I10 Essential (primary) hypertension: Secondary | ICD-10-CM | POA: Diagnosis not present

## 2021-03-15 DIAGNOSIS — D509 Iron deficiency anemia, unspecified: Secondary | ICD-10-CM | POA: Diagnosis not present

## 2021-03-15 DIAGNOSIS — M4800 Spinal stenosis, site unspecified: Secondary | ICD-10-CM | POA: Diagnosis not present

## 2021-03-15 DIAGNOSIS — Z1331 Encounter for screening for depression: Secondary | ICD-10-CM | POA: Diagnosis not present

## 2021-03-15 DIAGNOSIS — G40909 Epilepsy, unspecified, not intractable, without status epilepticus: Secondary | ICD-10-CM | POA: Diagnosis not present

## 2021-03-23 DIAGNOSIS — M545 Low back pain, unspecified: Secondary | ICD-10-CM | POA: Diagnosis not present

## 2021-03-29 DIAGNOSIS — M5416 Radiculopathy, lumbar region: Secondary | ICD-10-CM | POA: Diagnosis not present

## 2021-03-29 DIAGNOSIS — M545 Low back pain, unspecified: Secondary | ICD-10-CM | POA: Diagnosis not present

## 2021-03-30 ENCOUNTER — Other Ambulatory Visit: Payer: Self-pay | Admitting: Adult Health

## 2021-03-30 DIAGNOSIS — M5416 Radiculopathy, lumbar region: Secondary | ICD-10-CM

## 2021-03-30 DIAGNOSIS — M545 Low back pain, unspecified: Secondary | ICD-10-CM

## 2021-04-10 ENCOUNTER — Encounter (HOSPITAL_BASED_OUTPATIENT_CLINIC_OR_DEPARTMENT_OTHER): Payer: Self-pay

## 2021-04-10 ENCOUNTER — Other Ambulatory Visit: Payer: Self-pay

## 2021-04-10 ENCOUNTER — Inpatient Hospital Stay (HOSPITAL_BASED_OUTPATIENT_CLINIC_OR_DEPARTMENT_OTHER)
Admission: EM | Admit: 2021-04-10 | Discharge: 2021-04-14 | DRG: 641 | Disposition: A | Discharge status: Skilled Nursing Facility | Payer: Medicare Other | Source: Skilled Nursing Facility | Attending: Internal Medicine | Admitting: Internal Medicine

## 2021-04-10 DIAGNOSIS — Z79899 Other long term (current) drug therapy: Secondary | ICD-10-CM

## 2021-04-10 DIAGNOSIS — E871 Hypo-osmolality and hyponatremia: Principal | ICD-10-CM | POA: Diagnosis present

## 2021-04-10 DIAGNOSIS — Z87891 Personal history of nicotine dependence: Secondary | ICD-10-CM | POA: Diagnosis not present

## 2021-04-10 DIAGNOSIS — Z801 Family history of malignant neoplasm of trachea, bronchus and lung: Secondary | ICD-10-CM

## 2021-04-10 DIAGNOSIS — G40209 Localization-related (focal) (partial) symptomatic epilepsy and epileptic syndromes with complex partial seizures, not intractable, without status epilepticus: Secondary | ICD-10-CM | POA: Diagnosis not present

## 2021-04-10 DIAGNOSIS — I10 Essential (primary) hypertension: Secondary | ICD-10-CM | POA: Diagnosis present

## 2021-04-10 DIAGNOSIS — G8929 Other chronic pain: Secondary | ICD-10-CM | POA: Diagnosis not present

## 2021-04-10 DIAGNOSIS — E861 Hypovolemia: Secondary | ICD-10-CM | POA: Diagnosis present

## 2021-04-10 DIAGNOSIS — E782 Mixed hyperlipidemia: Secondary | ICD-10-CM | POA: Diagnosis not present

## 2021-04-10 DIAGNOSIS — Z8669 Personal history of other diseases of the nervous system and sense organs: Secondary | ICD-10-CM

## 2021-04-10 DIAGNOSIS — M48061 Spinal stenosis, lumbar region without neurogenic claudication: Secondary | ICD-10-CM | POA: Diagnosis not present

## 2021-04-10 DIAGNOSIS — E876 Hypokalemia: Secondary | ICD-10-CM | POA: Diagnosis not present

## 2021-04-10 DIAGNOSIS — Z66 Do not resuscitate: Secondary | ICD-10-CM | POA: Diagnosis not present

## 2021-04-10 DIAGNOSIS — Z20822 Contact with and (suspected) exposure to covid-19: Secondary | ICD-10-CM | POA: Diagnosis present

## 2021-04-10 DIAGNOSIS — Z823 Family history of stroke: Secondary | ICD-10-CM | POA: Diagnosis not present

## 2021-04-10 LAB — URINALYSIS, ROUTINE W REFLEX MICROSCOPIC
Bilirubin Urine: NEGATIVE
Glucose, UA: NEGATIVE mg/dL
Hgb urine dipstick: NEGATIVE
Ketones, ur: NEGATIVE mg/dL
Leukocytes,Ua: NEGATIVE
Nitrite: NEGATIVE
Protein, ur: NEGATIVE mg/dL
Specific Gravity, Urine: 1.005 (ref 1.005–1.030)
pH: 7 (ref 5.0–8.0)

## 2021-04-10 LAB — COMPREHENSIVE METABOLIC PANEL
ALT: 11 U/L (ref 0–44)
AST: 20 U/L (ref 15–41)
Albumin: 3.8 g/dL (ref 3.5–5.0)
Alkaline Phosphatase: 68 U/L (ref 38–126)
Anion gap: 10 (ref 5–15)
BUN: 15 mg/dL (ref 8–23)
CO2: 26 mmol/L (ref 22–32)
Calcium: 8.7 mg/dL — ABNORMAL LOW (ref 8.9–10.3)
Chloride: 86 mmol/L — ABNORMAL LOW (ref 98–111)
Creatinine, Ser: 0.84 mg/dL (ref 0.44–1.00)
GFR, Estimated: >60 mL/min (ref >60)
Glucose, Bld: 103 mg/dL — ABNORMAL HIGH (ref 70–99)
Potassium: 3.7 mmol/L (ref 3.5–5.1)
Sodium: 122 mmol/L — ABNORMAL LOW (ref 135–145)
Total Bilirubin: 0.4 mg/dL (ref 0.3–1.2)
Total Protein: 6.9 g/dL (ref 6.5–8.1)

## 2021-04-10 LAB — CBC
HCT: 28.7 % — ABNORMAL LOW (ref 36.0–46.0)
Hemoglobin: 10.2 g/dL — ABNORMAL LOW (ref 12.0–15.0)
MCH: 32.9 pg (ref 26.0–34.0)
MCHC: 35.5 g/dL (ref 30.0–36.0)
MCV: 92.6 fL (ref 80.0–100.0)
Platelets: 216 10*3/uL (ref 150–400)
RBC: 3.1 MIL/uL — ABNORMAL LOW (ref 3.87–5.11)
RDW: 11.6 % (ref 11.5–15.5)
WBC: 5.5 10*3/uL (ref 4.0–10.5)
nRBC: 0 % (ref 0.0–0.2)

## 2021-04-10 LAB — BASIC METABOLIC PANEL
Anion gap: 10 (ref 5–15)
Anion gap: 8 (ref 5–15)
BUN: 13 mg/dL (ref 8–23)
BUN: 14 mg/dL (ref 8–23)
CO2: 27 mmol/L (ref 22–32)
CO2: 27 mmol/L (ref 22–32)
Calcium: 8.8 mg/dL — ABNORMAL LOW (ref 8.9–10.3)
Calcium: 8.7 mg/dL — ABNORMAL LOW (ref 8.9–10.3)
Chloride: 88 mmol/L — ABNORMAL LOW (ref 98–111)
Chloride: 89 mmol/L — ABNORMAL LOW (ref 98–111)
Creatinine, Ser: 0.82 mg/dL (ref 0.44–1.00)
Creatinine, Ser: 0.74 mg/dL (ref 0.44–1.00)
GFR, Estimated: >60 mL/min (ref >60)
GFR, Estimated: >60 mL/min (ref >60)
Glucose, Bld: 107 mg/dL — ABNORMAL HIGH (ref 70–99)
Glucose, Bld: 118 mg/dL — ABNORMAL HIGH (ref 70–99)
Potassium: 3.3 mmol/L — ABNORMAL LOW (ref 3.5–5.1)
Potassium: 3.3 mmol/L — ABNORMAL LOW (ref 3.5–5.1)
Sodium: 125 mmol/L — ABNORMAL LOW (ref 135–145)
Sodium: 124 mmol/L — ABNORMAL LOW (ref 135–145)

## 2021-04-10 LAB — LIPASE, BLOOD: Lipase: 61 U/L — ABNORMAL HIGH (ref 11–51)

## 2021-04-10 LAB — SODIUM, URINE, RANDOM: Sodium, Ur: 57 mmol/L

## 2021-04-10 LAB — RESP PANEL BY RT-PCR (FLU A&B, COVID) ARPGX2
Influenza A by PCR: NEGATIVE
Influenza B by PCR: NEGATIVE
SARS Coronavirus 2 by RT PCR: NEGATIVE

## 2021-04-10 LAB — OSMOLALITY, URINE: Osmolality, Ur: 204 mOsm/kg — ABNORMAL LOW (ref 300–900)

## 2021-04-10 LAB — MAGNESIUM: Magnesium: 1.3 mg/dL — ABNORMAL LOW (ref 1.7–2.4)

## 2021-04-10 MED ORDER — ENSURE ENLIVE PO LIQD
237.0000 mL | Freq: Two times a day (BID) | ORAL | Status: DC
  Administered 2021-04-11 – 2021-04-14 (×6): 237 mL via ORAL

## 2021-04-10 MED ORDER — ACETAMINOPHEN 325 MG PO TABS
650.0000 mg | ORAL_TABLET | Freq: Four times a day (QID) | ORAL | Status: DC | PRN
  Administered 2021-04-10 – 2021-04-14 (×4): 650 mg via ORAL
  Filled 2021-04-10 (×4): qty 2

## 2021-04-10 MED ORDER — ACETAMINOPHEN 650 MG RE SUPP: 650.0000 mg | Freq: Four times a day (QID) | RECTAL | Status: DC | PRN

## 2021-04-10 MED ORDER — SENNOSIDES-DOCUSATE SODIUM 8.6-50 MG PO TABS: 1.0000 | ORAL_TABLET | Freq: Every evening | ORAL | Status: DC | PRN

## 2021-04-10 MED ORDER — SIMVASTATIN 20 MG PO TABS
20.0000 mg | ORAL_TABLET | Freq: Every day | ORAL | Status: DC
  Administered 2021-04-10 – 2021-04-13 (×4): 20 mg via ORAL
  Filled 2021-04-10 (×4): qty 1

## 2021-04-10 MED ORDER — PHENYTOIN SODIUM EXTENDED 100 MG PO CAPS
200.0000 mg | ORAL_CAPSULE | Freq: Every day | ORAL | Status: DC
  Administered 2021-04-10 – 2021-04-14 (×5): 200 mg via ORAL
  Filled 2021-04-10 (×5): qty 2

## 2021-04-10 MED ORDER — HYDRALAZINE HCL 20 MG/ML IJ SOLN
10.0000 mg | Freq: Four times a day (QID) | INTRAMUSCULAR | Status: DC | PRN
  Filled 2021-04-10: qty 1

## 2021-04-10 MED ORDER — CARVEDILOL 25 MG PO TABS
25.0000 mg | ORAL_TABLET | Freq: Two times a day (BID) | ORAL | Status: DC
  Administered 2021-04-10 – 2021-04-12 (×4): 25 mg via ORAL
  Filled 2021-04-10 (×4): qty 1

## 2021-04-10 MED ORDER — ONDANSETRON HCL 4 MG PO TABS: 4.0000 mg | ORAL_TABLET | Freq: Four times a day (QID) | ORAL | Status: DC | PRN

## 2021-04-10 MED ORDER — POTASSIUM CHLORIDE IN NACL 40-0.9 MEQ/L-% IV SOLN
INTRAVENOUS | Status: DC
  Filled 2021-04-10: qty 1000

## 2021-04-10 MED ORDER — ONDANSETRON HCL 4 MG/2ML IJ SOLN: 4.0000 mg | Freq: Four times a day (QID) | INTRAMUSCULAR | Status: DC | PRN

## 2021-04-10 MED ORDER — SODIUM CHLORIDE 0.9% FLUSH
3.0000 mL | Freq: Two times a day (BID) | INTRAVENOUS | Status: DC
  Administered 2021-04-11: 3 mL via INTRAVENOUS

## 2021-04-10 MED ORDER — PHENOBARBITAL 32.4 MG PO TABS
64.8000 mg | ORAL_TABLET | Freq: Every day | ORAL | Status: DC
  Administered 2021-04-10 – 2021-04-14 (×5): 64.8 mg via ORAL
  Filled 2021-04-10 (×5): qty 2

## 2021-04-10 MED ORDER — SODIUM CHLORIDE 0.9 % IV SOLN: Freq: Once | INTRAVENOUS | Status: AC

## 2021-04-10 MED ORDER — ENOXAPARIN SODIUM 40 MG/0.4ML IJ SOSY
40.0000 mg | PREFILLED_SYRINGE | INTRAMUSCULAR | Status: DC
  Administered 2021-04-10 – 2021-04-13 (×4): 40 mg via SUBCUTANEOUS
  Filled 2021-04-10 (×4): qty 0.4

## 2021-04-10 NOTE — ED Provider Notes (Signed)
[facility] EMERGENCY DEPT Provider Note   CSN: [id number] Arrival date & time: 04/10/21  2122    History Chief Complaint  Patient presents with   Diarrhea    [name] is a 85 y.o. female.  HPI 85 year old female presents with diarrhea and weakness.  This has been ongoing for about 2 or 3 days.  Last night was the worst diarrhea wise.  No blood in the stools though she has not looked.  No fevers or abdominal pain.  She is worried she has significant hyponatremia like she did in her most recent admission in February which was a similar presentation. No recent travel or antibiotics.  Past Medical History:  Diagnosis Date   Abnormal cardiovascular stress test    Carotid arterial disease (HCC)    MODERATE RIGHT   Cerebral AV malformation    DDD (degenerative disc disease)    SEVERE WITH SPINAL STENOSIS   Hypercholesterolemia    Hypertension    MVP (mitral valve prolapse)    MILD   Numbness    LEFT FOOT AND ANKLE   Osteopenia    Pseudo-gout     Patient Active Problem List   Diagnosis Date Noted   Pressure injury of skin 07/22/2020   Hyponatremia 07/21/2020   Mixed hyperlipidemia 48/25/0037   Acute metabolic encephalopathy 04/88/8916   History of complex partial epilepsy 07/21/2020   Hypomagnesemia 07/21/2020   Hypokalemia due to excessive gastrointestinal loss of potassium 07/21/2020   Acute diarrhea 07/21/2020   Partial symptomatic epilepsy with complex partial seizures, not intractable, without status epilepticus ([place]) 07/28/2014   Spinal stenosis of lumbar region 07/28/2014   Abnormality of gait 11/24/2013   Low back pain 11/24/2013   Essential hypertension    Hypercholesterolemia    Carotid arterial disease (HCC)    MVP (mitral valve prolapse)    Abnormal cardiovascular stress test     Past Surgical History:  Procedure Laterality Date   APPENDECTOMY     BREAST MASS EXCISION     CARPAL TUNNEL RELEASE     REMOVAL OF PARATHYROID GLAND      STRESS NUCLEAR STUDY     ABNORMAL   TONSILLECTOMY       OB History   No obstetric history on file.     Family History  Problem Relation Age of Onset   Stroke Mother    Lung cancer Father    Breast cancer Neg Hx     Social History   Tobacco Use   Smoking status: Former    Packs/day: 1.00    Years: 14.00    Pack years: 14.00    Types: Cigarettes    Quit date: 11/07/1968    Years since quitting: 52.4   Smokeless tobacco: Never  Vaping Use   Vaping Use: Never used  Substance Use Topics   Alcohol use: No   Drug use: No    Home Medications Prior to Admission medications   Medication Sig Start Date End Date Taking? Authorizing Provider  carvedilol (COREG) 25 MG tablet TAKE 1 TABLET BY MOUTH TWICE DAILY. 11/22/20  Yes Martinique, [name], MD  ferrous sulfate 325 (65 FE) MG tablet Take 1 tablet (325 mg total) by mouth 2 (two) times daily with a meal. 07/25/20  Yes Dahal, Marlowe Aschoff, MD  PHENobarbital (LUMINAL) 32.4 MG tablet Take 64.8 mg by mouth daily.   Yes [provider]  phenytoin (DILANTIN) 100 MG ER capsule Take 200 mg by mouth daily.   Yes [provider]  simvastatin (ZOCOR) 20 MG tablet Take 20 mg by mouth at bedtime.   Yes [provider]    Allergies    Azithromycin, Naproxen sodium, Penicillins, Pravastatin, and Sulfa drugs cross reactors  Review of Systems   Review of Systems  Constitutional:  Negative for fever.  Gastrointestinal:  Positive for diarrhea. Negative for abdominal pain, blood in stool and vomiting.  All other systems reviewed and are negative.  Physical Exam Updated Vital Signs BP (!) 180/54 (BP Location: Left Arm)   Pulse 71   Temp 98.9 F (37.2 C) (Oral)   Resp 15   Ht 5\' 5"  (4.665 m)   Wt 68 kg   SpO2 98%   BMI 24.96 kg/m   Physical Exam Vitals and nursing note reviewed.  Constitutional:      General: She is not in acute distress.    Appearance: She is well-developed. She is not ill-appearing or  diaphoretic.  HENT:     Head: Normocephalic and atraumatic.     Right Ear: External ear normal.     Left Ear: External ear normal.     Nose: Nose normal.  Eyes:     General:        Right eye: No discharge.        Left eye: No discharge.  Cardiovascular:     Rate and Rhythm: Normal rate and regular rhythm.     Heart sounds: Murmur heard.  Pulmonary:     Effort: Pulmonary effort is normal.     Breath sounds: Normal breath sounds.  Abdominal:     Palpations: Abdomen is soft.     Tenderness: There is no abdominal tenderness.  Skin:    General: Skin is warm and dry.  Neurological:     Mental Status: She is alert.  Psychiatric:        Mood and Affect: Mood is not anxious.    ED Results / Procedures / Treatments   Labs (all labs ordered are listed, but only abnormal results are displayed) Labs Reviewed  LIPASE, BLOOD - Abnormal; Notable for the following components:      Result Value   Lipase 61 (*)    All other components within normal limits  COMPREHENSIVE METABOLIC PANEL - Abnormal; Notable for the following components:   Sodium 122 (*)    Chloride 86 (*)    Glucose, Bld 103 (*)    Calcium 8.7 (*)    All other components within normal limits  CBC - Abnormal; Notable for the following components:   RBC 3.10 (*)    Hemoglobin 10.2 (*)    HCT 28.7 (*)    All other components within normal limits  URINALYSIS, ROUTINE W REFLEX MICROSCOPIC - Abnormal; Notable for the following components:   Color, Urine COLORLESS (*)    All other components within normal limits  RESP PANEL BY RT-PCR (FLU A&B, COVID) ARPGX2  SODIUM, URINE, RANDOM  OSMOLALITY, URINE  BASIC METABOLIC PANEL  BASIC METABOLIC PANEL  BASIC METABOLIC PANEL    EKG EKG Interpretation  Date/Time:  Monday April 10 2021 11:07:24 EDT Ventricular Rate:  79 PR Interval:    QRS Duration: 148 QT Interval:  458 QTC Calculation: 526 R Axis:   68 Text Interpretation: Normal sinus rhythm Right bundle branch block  Artifact in lead(s) I II III aVR aVL aVF V1 V2 Confirmed by Sherwood Gambler [phone]) on 04/10/2021 11:57:02 AM  Radiology No results found.  Procedures Procedures   Medications Ordered in ED Medications  0.9 %  sodium chloride infusion ( Intravenous New Bag/Given 04/10/21 1306)    ED Course  I have reviewed the triage vital signs and the nursing notes.  Pertinent labs & imaging results that were available during my care of the patient were reviewed by me and considered in my medical decision making (see chart for details).    MDM Rules/Calculators/A&P                           Presentation is similar to February when she had a little more significant hyponatremia.  However her most recent sodium was 139, indicating this is not a chronic problem for her.  Today it is 122.  No significant AKI though with the diarrhea there is probably a hypovolemic component as her was last time.  Discussed with Dr. Marylyn Ishihara, will send urine osmolality and urine sodium and give 75 cc an hour of sodium chloride.  Will order every 4 BMPs as she is likely to hold in the emergency department for a while. Final Clinical Impression(s) / ED Diagnoses Final diagnoses:  Hyponatremia    Rx / DC Orders ED Discharge Orders     None        Sherwood Gambler, MD 04/10/21 1441

## 2021-04-10 NOTE — ED Triage Notes (Signed)
Pt arrives from [place] with 2-3 day history of loose stools/diarrhea.  Denies any abdominal pain.

## 2021-04-10 NOTE — H&P (Signed)
History and Physical    [name] DOB: [birth year] DOA: 04/10/2021  PCP: Burnard Bunting, MD  Patient coming from: [facility] ED from [place]  I have personally briefly reviewed patient's old medical records in [place]  Chief Complaint: Loose stools  HPI: [name] is a 85 y.o. female with medical history significant for hypertension, hyperlipidemia, lumbar spinal stenosis, history of complex partial epilepsy who presented to the ED from her nursing facility for evaluation of loose stools.  Patient states over the last 3-4 days she has been having loose stools which became watery last night.  She has been feeling generally weak.  She has had no appetite.  She reports experiencing dry heaves but no emesis.  She has not had any abdominal pain.  She is not having any dysuria.  She denies any chest pain, dyspnea, fevers, chills, diaphoresis.  She denies any sick contacts.  She denies any recent changes in her medications or antibiotic use.  Patient states she is feeling somewhat confused which is similar to when she was admitted for hyponatremia in February 2022.  She is otherwise awake, alert, oriented to self, place, year, situation.  [place] ED Course:  Initial vitals showed BP 201/66, pulse 72, RR 16, temp 98.9 F, SPO2 100% on room air.  Labs showed sodium 122, potassium 3.7, chloride 86, bicarb 26, BUN 15, creatinine 0.84, serum glucose 103, LFTs within normal limits, lipase 61, WBC 5.5, hemoglobin 10.2, platelets 216,000.  Urinalysis negative.  Urine sodium 57, urine osmolality 204.  SARS-CoV-2 and influenza PCR's are negative.  Patient was started on normal saline@75  mils/hour.  Repeat BMP 6 hours later showed sodium 125 and potassium 3.3.  The hospitalist service was consulted to admit for further evaluation and management.  Review of Systems: All systems reviewed and are negative except as documented in history  of present illness above.   Past Medical History:  Diagnosis Date   Abnormal cardiovascular stress test    Carotid arterial disease (HCC)    MODERATE RIGHT   Cerebral AV malformation    DDD (degenerative disc disease)    SEVERE WITH SPINAL STENOSIS   Hypercholesterolemia    Hypertension    MVP (mitral valve prolapse)    MILD   Numbness    LEFT FOOT AND ANKLE   Osteopenia    Pseudo-gout     Past Surgical History:  Procedure Laterality Date   APPENDECTOMY     BREAST MASS EXCISION     CARPAL TUNNEL RELEASE     REMOVAL OF PARATHYROID GLAND     STRESS NUCLEAR STUDY     ABNORMAL   TONSILLECTOMY      Social History:  reports that she quit smoking about 52 years ago. Her smoking use included cigarettes. She has a 14.00 pack-year smoking history. She has never used smokeless tobacco. She reports that she does not drink alcohol and does not use drugs.  Allergies  Allergen Reactions   Azithromycin Other (See Comments)    unknown   Naproxen Sodium     REACTION: rash   Penicillins     REACTION: hives   Pravastatin    Sulfa Drugs Cross Reactors     Family History  Problem Relation Age of Onset   Stroke Mother    Lung cancer Father    Breast cancer Neg Hx      Prior to Admission medications   Medication Sig Start Date End Date Taking? Authorizing  Provider  carvedilol (COREG) 25 MG tablet TAKE 1 TABLET BY MOUTH TWICE DAILY. 11/22/20  Yes Martinique, [name], MD  ferrous sulfate 325 (65 FE) MG tablet Take 1 tablet (325 mg total) by mouth 2 (two) times daily with a meal. 07/25/20  Yes Dahal, Marlowe Aschoff, MD  PHENobarbital (LUMINAL) 32.4 MG tablet Take 64.8 mg by mouth daily.   Yes [provider]  phenytoin (DILANTIN) 100 MG ER capsule Take 200 mg by mouth daily.   Yes [provider]  simvastatin (ZOCOR) 20 MG tablet Take 20 mg by mouth at bedtime.   Yes [provider]    Physical Exam: Vitals:   04/10/21 1008 04/10/21 1134 04/10/21 1630 04/10/21 1748   BP:  (!) 180/54 (!) 210/71 (!) 185/57  Pulse:  71 79 71  Resp:  15 19 16   Temp:   98.9 F (37.2 C) 98.3 F (36.8 C)  TempSrc:   Oral Oral  SpO2:  98% 97% 97%  Weight: 68 kg     Height: 5\' 5"  (1.651 m)      Constitutional: Resting in bed, NAD, calm, comfortable Eyes: PERRL, lids and conjunctivae normal ENMT: Mucous membranes are dry. Posterior pharynx clear of any exudate or lesions.Normal dentition.  Neck: normal, supple, no masses. Respiratory: clear to auscultation bilaterally, no wheezing, no crackles. Normal respiratory effort. No accessory muscle use.  Cardiovascular: Regular rate and rhythm, no murmurs / rubs / gallops. No extremity edema. 2+ pedal pulses. Abdomen: Reducible umbilical hernia present, no tenderness, no masses palpated. No hepatosplenomegaly. Musculoskeletal: no clubbing / cyanosis. No joint deformity upper and lower extremities. Good ROM, no contractures. Normal muscle tone.  Skin: no rashes, lesions, ulcers. No induration Neurologic: CN 2-12 grossly intact. Sensation intact. Strength 5/5 in all 4.  Psychiatric: Normal judgment and insight. Alert and oriented x 3. Normal mood.   Labs on Admission: I have personally reviewed following labs and imaging studies  CBC: Recent Labs  Lab 04/10/21 1027  WBC 5.5  HGB 10.2*  HCT 28.7*  MCV 92.6  PLT 364   Basic Metabolic Panel: Recent Labs  Lab 04/10/21 1027 04/10/21 1641  NA 122* 125*  K 3.7 3.3*  CL 86* 88*  CO2 26 27  GLUCOSE 103* 107*  BUN 15 13  CREATININE 0.84 0.82  CALCIUM 8.7* 8.8*   GFR: Estimated Creatinine Clearance: 47.6 mL/min (by C-G formula based on SCr of 0.82 mg/dL). Liver Function Tests: Recent Labs  Lab 04/10/21 1027  AST 20  ALT 11  ALKPHOS 68  BILITOT 0.4  PROT 6.9  ALBUMIN 3.8   Recent Labs  Lab 04/10/21 1027  LIPASE 61*   No results for input(s): AMMONIA in the last 168 hours. Coagulation Profile: No results for input(s): INR, PROTIME in the last 168  hours. Cardiac Enzymes: No results for input(s): CKTOTAL, CKMB, CKMBINDEX, TROPONINI in the last 168 hours. BNP (last 3 results) No results for input(s): PROBNP in the last 8760 hours. HbA1C: No results for input(s): HGBA1C in the last 72 hours. CBG: No results for input(s): GLUCAP in the last 168 hours. Lipid Profile: No results for input(s): CHOL, HDL, LDLCALC, TRIG, CHOLHDL, LDLDIRECT in the last 72 hours. Thyroid Function Tests: No results for input(s): TSH, T4TOTAL, FREET4, T3FREE, THYROIDAB in the last 72 hours. Anemia Panel: No results for input(s): VITAMINB12, FOLATE, FERRITIN, TIBC, IRON, RETICCTPCT in the last 72 hours. Urine analysis:    Component Value Date/Time   COLORURINE COLORLESS (A) 04/10/2021 1241   APPEARANCEUR  CLEAR 04/10/2021 1241   LABSPEC 1.005 04/10/2021 1241   PHURINE 7.0 04/10/2021 [place] 04/10/2021 1241   [place] 04/10/2021 1241   [place] 04/10/2021 [place] 04/10/2021 [place] 04/10/2021 1241   NITRITE NEGATIVE 04/10/2021 [place] 04/10/2021 1241    Radiological Exams on Admission: No results found.  EKG: Personally reviewed. Normal sinus rhythm, RBBB, motion artifact present.  When compared to previous EKG, frequent PACs no longer present.  Assessment/Plan Principal Problem:   Hyponatremia Active Problems:   Essential hypertension   Mixed hyperlipidemia   History of complex partial epilepsy   [name] is a 85 y.o. female with medical history significant for hypertension, hyperlipidemia, lumbar spinal stenosis, history of complex partial epilepsy who is admitted with hyponatremia.  Hyponatremia: Suspect hypovolemic hyponatremia given reported GI losses.  Urine osmolality mildly low, urine sodium 57.  Serum osmolality not obtained, will add on.  Had similar admission in February 2022. -Continue IV NS@75  mLs/hour -Repeat sodium in  a.m.  Hypokalemia: Mild.  Add potassium to IV fluids.  Check magnesium and replete if needed.  Hypertension: BP elevated on admission.  Resume home Coreg.  Use IV hydralazine as needed.  Hyperlipidemia: Continue simvastatin.  History of complex partial epilepsy: Continue home phenytoin and phenobarbital.  DVT prophylaxis: Lovenox Code Status: DNR, confirmed on admission Family Communication: Discussed with patient's daughter at bedside Disposition Plan: From [facility] and likely return to same pending clinical progress Consults called: None Level of care: Telemetry Admission status:  Status is: Observation  The patient remains OBS appropriate and will d/c before 2 midnights.  Zada Finders MD [facility]  If 7PM-7AM, please contact night-coverage [email or web address]  04/10/2021, 6:51 PM

## 2021-04-10 NOTE — ED Notes (Signed)
[facility] at bedside 

## 2021-04-10 NOTE — Progress Notes (Signed)
Notified by EDP of need for admission d/t hyponatremia. [facility] accepts patient to tele at [facility]. EDP is to remain responsible for orders/medical decisions while patient is holding at [facility]. Upon arrival to [facility], [facility] will assume care. Nursing staff will call patient placement to notify them of patient's arrival so that the proper [facility] member may receive the patient. Thank you.

## 2021-04-11 ENCOUNTER — Other Ambulatory Visit: Payer: Medicare Other

## 2021-04-11 DIAGNOSIS — E876 Hypokalemia: Secondary | ICD-10-CM | POA: Diagnosis present

## 2021-04-11 DIAGNOSIS — Z66 Do not resuscitate: Secondary | ICD-10-CM | POA: Diagnosis present

## 2021-04-11 DIAGNOSIS — Z79899 Other long term (current) drug therapy: Secondary | ICD-10-CM | POA: Diagnosis not present

## 2021-04-11 DIAGNOSIS — G40209 Localization-related (focal) (partial) symptomatic epilepsy and epileptic syndromes with complex partial seizures, not intractable, without status epilepticus: Secondary | ICD-10-CM | POA: Diagnosis present

## 2021-04-11 DIAGNOSIS — Z801 Family history of malignant neoplasm of trachea, bronchus and lung: Secondary | ICD-10-CM | POA: Diagnosis not present

## 2021-04-11 DIAGNOSIS — E782 Mixed hyperlipidemia: Secondary | ICD-10-CM | POA: Diagnosis present

## 2021-04-11 DIAGNOSIS — G8929 Other chronic pain: Secondary | ICD-10-CM | POA: Diagnosis present

## 2021-04-11 DIAGNOSIS — I959 Hypotension, unspecified: Secondary | ICD-10-CM | POA: Diagnosis not present

## 2021-04-11 DIAGNOSIS — Z823 Family history of stroke: Secondary | ICD-10-CM | POA: Diagnosis not present

## 2021-04-11 DIAGNOSIS — M48061 Spinal stenosis, lumbar region without neurogenic claudication: Secondary | ICD-10-CM | POA: Diagnosis present

## 2021-04-11 DIAGNOSIS — Z20822 Contact with and (suspected) exposure to covid-19: Secondary | ICD-10-CM | POA: Diagnosis present

## 2021-04-11 DIAGNOSIS — E871 Hypo-osmolality and hyponatremia: Secondary | ICD-10-CM | POA: Diagnosis present

## 2021-04-11 DIAGNOSIS — Z7401 Bed confinement status: Secondary | ICD-10-CM | POA: Diagnosis not present

## 2021-04-11 DIAGNOSIS — Z87891 Personal history of nicotine dependence: Secondary | ICD-10-CM | POA: Diagnosis not present

## 2021-04-11 DIAGNOSIS — E861 Hypovolemia: Secondary | ICD-10-CM | POA: Diagnosis present

## 2021-04-11 DIAGNOSIS — I10 Essential (primary) hypertension: Secondary | ICD-10-CM | POA: Diagnosis present

## 2021-04-11 LAB — BASIC METABOLIC PANEL
Anion gap: 8 (ref 5–15)
Anion gap: 8 (ref 5–15)
BUN: 15 mg/dL (ref 8–23)
BUN: 20 mg/dL (ref 8–23)
CO2: 26 mmol/L (ref 22–32)
CO2: 26 mmol/L (ref 22–32)
Calcium: 8.4 mg/dL — ABNORMAL LOW (ref 8.9–10.3)
Calcium: 8.7 mg/dL — ABNORMAL LOW (ref 8.9–10.3)
Chloride: 90 mmol/L — ABNORMAL LOW (ref 98–111)
Chloride: 93 mmol/L — ABNORMAL LOW (ref 98–111)
Creatinine, Ser: 0.7 mg/dL (ref 0.44–1.00)
Creatinine, Ser: 0.83 mg/dL (ref 0.44–1.00)
GFR, Estimated: >60 mL/min (ref >60)
GFR, Estimated: >60 mL/min (ref >60)
Glucose, Bld: 109 mg/dL — ABNORMAL HIGH (ref 70–99)
Glucose, Bld: 131 mg/dL — ABNORMAL HIGH (ref 70–99)
Potassium: 3.5 mmol/L (ref 3.5–5.1)
Potassium: 3.9 mmol/L (ref 3.5–5.1)
Sodium: 124 mmol/L — ABNORMAL LOW (ref 135–145)
Sodium: 127 mmol/L — ABNORMAL LOW (ref 135–145)

## 2021-04-11 LAB — CBC
HCT: 26.7 % — ABNORMAL LOW (ref 36.0–46.0)
Hemoglobin: 9.3 g/dL — ABNORMAL LOW (ref 12.0–15.0)
MCH: 33.6 pg (ref 26.0–34.0)
MCHC: 34.8 g/dL (ref 30.0–36.0)
MCV: 96.4 fL (ref 80.0–100.0)
Platelets: 187 10*3/uL (ref 150–400)
RBC: 2.77 MIL/uL — ABNORMAL LOW (ref 3.87–5.11)
RDW: 11.8 % (ref 11.5–15.5)
WBC: 5.8 10*3/uL (ref 4.0–10.5)
nRBC: 0 % (ref 0.0–0.2)

## 2021-04-11 LAB — MAGNESIUM: Magnesium: 2 mg/dL (ref 1.7–2.4)

## 2021-04-11 LAB — OSMOLALITY: Osmolality: 263 mOsm/kg — ABNORMAL LOW (ref 275–295)

## 2021-04-11 MED ORDER — SODIUM CHLORIDE 0.9 % IV SOLN: INTRAVENOUS | Status: DC

## 2021-04-11 MED ORDER — MAGNESIUM SULFATE 2 GM/50ML IV SOLN
2.0000 g | Freq: Once | INTRAVENOUS | Status: AC
  Administered 2021-04-11: 2 g via INTRAVENOUS
  Filled 2021-04-11: qty 50

## 2021-04-11 MED ORDER — SODIUM CHLORIDE 0.9 % IV SOLN: Freq: Once | INTRAVENOUS | Status: AC

## 2021-04-11 MED ORDER — LABETALOL HCL 5 MG/ML IV SOLN: 10.0000 mg | INTRAVENOUS | Status: DC | PRN

## 2021-04-11 MED ORDER — HYDRALAZINE HCL 20 MG/ML IJ SOLN: 10.0000 mg | INTRAMUSCULAR | Status: DC | PRN

## 2021-04-11 NOTE — TOC Initial Note (Signed)
Transition of Care [facility]) - Initial/Assessment Note    Patient Details  Name: [name] MRN: [id number] Date of Birth: Jan 15, 1936  Transition of Care [facility]) CM/SW Contact:    Leeroy Cha, RN Phone Number: 04/11/2021, 9:02 AM  Clinical Narrative:                 85 y.o. female with medical history significant for hypertension, hyperlipidemia, lumbar spinal stenosis, history of complex partial epilepsy who presented to the ED from her nursing facility for evaluation of loose stools.  Patient states over the last 3-4 days she has been having loose stools which became watery last night.  She has been feeling generally weak.  She has had no appetite.  She reports experiencing dry heaves but no emesis.  She has not had any abdominal pain.  She is not having any dysuria.  She denies any chest pain, dyspnea, fevers, chills, diaphoresis.  She denies any sick contacts.  She denies any recent changes in her medications or antibiotic use.  Patient states she is feeling somewhat confused which is similar to when she was admitted for hyponatremia in February 2022.  She is otherwise awake, alert, oriented to self, place, year, situation.   [place] ED Course:  Initial vitals showed BP 201/66, pulse 72, RR 16, temp 98.9 F, SPO2 100% on room air.   Labs showed sodium 122, potassium 3.7, chloride 86, bicarb 26, BUN 15, creatinine 0.84, serum glucose 103, LFTs within normal limits, lipase 61, WBC 5.5, hemoglobin 10.2, platelets 216,000.   Urinalysis negative.  Urine sodium 57, urine osmolality 204.   SARS-CoV-2 and influenza PCR's are negative.   Patient was started on normal saline@75  mils/hour.  Repeat BMP 6 hours later showed sodium 125 and potassium 3.3. TOC PLAN OF CARE: To return to [facility] with self care. Following for toc needs and progression.  Expected Discharge Plan: Home/Self Care Barriers to Discharge: Continued Medical Work up   Patient Goals and CMS  Choice Patient states their goals for this hospitalization and ongoing recovery are:: to return to my home at [facility] Medicare.gov Compare Post Acute Care list provided to:: Patient    Expected Discharge Plan and Services Expected Discharge Plan: Home/Self Care   Discharge Planning Services: CM Consult   Living arrangements for the past 2 months: [place] ([facility])                                      Prior Living Arrangements/Services Living arrangements for the past 2 months: [place] ([facility] Lives with:: Self Patient language and need for interpreter reviewed:: Yes Do you feel safe going back to the place where you live?: Yes      Need for Family Participation in Patient Care: Yes (Comment) Care giver support system in place?: Yes (comment)   Criminal Activity/Legal Involvement Pertinent to Current Situation/Hospitalization: No - Comment as needed  Activities of Daily Living Home Assistive Devices/Equipment: Eyeglasses, Environmental consultant (specify type), Shower chair with back ADL Screening (condition at time of admission) Patient's cognitive ability adequate to safely complete daily activities?: Yes Is the patient deaf or have difficulty hearing?: No Does the patient have difficulty seeing, even when wearing glasses/contacts?: No Does the patient have difficulty concentrating, remembering, or making decisions?: Yes ("once in awhile") Patient able to express need for assistance with ADLs?: Yes Does the patient  have difficulty dressing or bathing?: No Independently performs ADLs?: Yes (appropriate for developmental age) (walks with a walker) Does the patient have difficulty walking or climbing stairs?: Yes Weakness of Legs: Both Weakness of Arms/Hands: Both  Permission Sought/Granted                  Emotional Assessment Appearance:: Appears stated age Attitude/Demeanor/Rapport: Engaged Affect (typically  observed): Calm Orientation: : Oriented to Place, Oriented to Self, Oriented to  Time, Oriented to Situation Alcohol / Substance Use: Not Applicable Psych Involvement: No (comment)  Admission diagnosis:  Hyponatremia [E87.1] Patient Active Problem List   Diagnosis Date Noted   Pressure injury of skin 07/22/2020   Hyponatremia 07/21/2020   Mixed hyperlipidemia 09/38/1829   Acute metabolic encephalopathy 93/71/6967   History of complex partial epilepsy 07/21/2020   Hypomagnesemia 07/21/2020   Hypokalemia due to excessive gastrointestinal loss of potassium 07/21/2020   Acute diarrhea 07/21/2020   Partial symptomatic epilepsy with complex partial seizures, not intractable, without status epilepticus ([place]) 07/28/2014   Spinal stenosis of lumbar region 07/28/2014   Abnormality of gait 11/24/2013   Low back pain 11/24/2013   Essential hypertension    Hypercholesterolemia    Carotid arterial disease (HCC)    MVP (mitral valve prolapse)    Abnormal cardiovascular stress test    PCP:  Burnard Bunting, MD Pharmacy:   [facility], [place] [address] Phone: [phone] Fax: [phone]     Social Determinants of Health (SDOH) Interventions    Readmission Risk Interventions No flowsheet data found.

## 2021-04-11 NOTE — Assessment & Plan Note (Addendum)
-   Presumed hypovolemic hyponatremia due to poor oral intake.  She was not really describing much GI loss to me today compared to history on admission; other considered differential includes SIADH but no medications to explain and so far sodium level has been responding to fluids - check Urine creat to calculate FeNa but appears hypovolemic - continue IVF, repeat BMP in am

## 2021-04-11 NOTE — Assessment & Plan Note (Signed)
-  Continue Coreg 

## 2021-04-11 NOTE — Assessment & Plan Note (Signed)
-  Replete and recheck as needed 

## 2021-04-11 NOTE — Assessment & Plan Note (Signed)
-   Continue phenobarbital and Dilantin

## 2021-04-11 NOTE — Hospital Course (Signed)
[name] is an 85 yo female with PMH severe spinal stenosis, hypertension, hyperlipidemia, partial epilepsy who presented from her nursing facility due to weakness and loose stools.  She had a similar history in February 2022 when she had a GI bug and was found to be hyponatremic. The morning following admission she was denying GI symptoms but more so just ongoing lack of appetite and decreased energy/weakness.  Sodium level on admission was 122 and she was placed on fluids and admitted for further work-up and monitoring.

## 2021-04-11 NOTE — Progress Notes (Signed)
  Progress Note    [name]   LSL:[id number]  DOB: 09-04-1935  DOA: 04/10/2021     0 Date of Service: 04/11/2021   Clinical Course Ms. [name] is an 85 yo female with PMH severe spinal stenosis, hypertension, hyperlipidemia, partial epilepsy who presented from her nursing facility due to weakness and loose stools.  She had a similar history in February 2022 when she had a GI bug and was found to be hyponatremic. The morning following admission she was denying GI symptoms but more so just ongoing lack of appetite and decreased energy/weakness.  Sodium level on admission was 122 and she was placed on fluids and admitted for further work-up and monitoring.   Assessment and Plan * Hyponatremia - Presumed hypovolemic hyponatremia due to poor oral intake.  She was not really describing much GI loss to me today compared to history on admission; other considered differential includes SIADH but no medications to explain and so far sodium level has been responding to fluids - check Urine creat to calculate FeNa but appears hypovolemic - continue IVF, repeat BMP in am  Hypokalemia - Replete and recheck as needed  History of complex partial epilepsy - Continue phenobarbital and Dilantin  Mixed hyperlipidemia - Continue Zocor  Spinal stenosis of lumbar region - Severe spinal stenosis from L2-3 to L5-S1 per last MRI 2015 - continue outpatient PT  Essential hypertension - Continue Coreg   Subjective:  Sitting up in bed this morning eating breakfast.  Daughter and son-in-law are at bedside.  Patient's birthday is also today, wished her happy birthday. What of her symptoms is ongoing chronic back pain for which she has known severe spinal stenosis and is managed outpatient. She has had a poor appetite the last few days with associated weakness.  We discussed ongoing trial of fluids and sodium monitoring to see how her symptoms respond.  Objective Vitals:   04/11/21 0223 04/11/21 0612  04/11/21 0930 04/11/21 1100  BP: (!) 156/52 (!) 154/48 (!) 183/47 (!) 156/48  Pulse: 62 60  61  Resp: 18 18  17   Temp: 98.1 F (36.7 C) (!) 97.5 F (36.4 C)  98.2 F (36.8 C)  TempSrc: Oral Oral  Oral  SpO2: 98% 97%  98%  Weight:      Height:       68 kg  Vital signs were reviewed and unremarkable.   Exam Physical Exam   Labs / Other Information My review of labs, imaging, notes and other tests shows no new significant findings.    Disposition Plan: Status is: Observation  The patient will require care spanning > 2 midnights and should be moved to inpatient because: ongoing IVF and Na trending     Time spent: Greater than 50% of the 35 minute visit was spent in counseling/coordination of care for the patient as laid out in the A&P.  Dwyane Dee, MD [facility] 04/11/2021, 11:45 AM

## 2021-04-11 NOTE — Assessment & Plan Note (Addendum)
-   Severe spinal stenosis from L2-3 to L5-S1 per last MRI 2015 - continue outpatient PT

## 2021-04-11 NOTE — Assessment & Plan Note (Signed)
Continue Zocor 

## 2021-04-12 DIAGNOSIS — E871 Hypo-osmolality and hyponatremia: Secondary | ICD-10-CM | POA: Diagnosis not present

## 2021-04-12 LAB — CBC WITH DIFFERENTIAL/PLATELET
Abs Immature Granulocytes: 0.02 10*3/uL (ref 0.00–0.07)
Basophils Absolute: 0 10*3/uL (ref 0.0–0.1)
Basophils Relative: 0 %
Eosinophils Absolute: 0.2 10*3/uL (ref 0.0–0.5)
Eosinophils Relative: 3 %
HCT: 25.7 % — ABNORMAL LOW (ref 36.0–46.0)
Hemoglobin: 8.8 g/dL — ABNORMAL LOW (ref 12.0–15.0)
Immature Granulocytes: 0 %
Lymphocytes Relative: 12 %
Lymphs Abs: 0.8 10*3/uL (ref 0.7–4.0)
MCH: 33.7 pg (ref 26.0–34.0)
MCHC: 34.2 g/dL (ref 30.0–36.0)
MCV: 98.5 fL (ref 80.0–100.0)
Monocytes Absolute: 1.1 10*3/uL — ABNORMAL HIGH (ref 0.1–1.0)
Monocytes Relative: 16 %
Neutro Abs: 4.7 10*3/uL (ref 1.7–7.7)
Neutrophils Relative %: 69 %
Platelets: 176 10*3/uL (ref 150–400)
RBC: 2.61 MIL/uL — ABNORMAL LOW (ref 3.87–5.11)
RDW: 12.1 % (ref 11.5–15.5)
WBC: 6.8 10*3/uL (ref 4.0–10.5)
nRBC: 0 % (ref 0.0–0.2)

## 2021-04-12 LAB — BASIC METABOLIC PANEL
Anion gap: 6 (ref 5–15)
BUN: 21 mg/dL (ref 8–23)
CO2: 26 mmol/L (ref 22–32)
Calcium: 8.2 mg/dL — ABNORMAL LOW (ref 8.9–10.3)
Chloride: 95 mmol/L — ABNORMAL LOW (ref 98–111)
Creatinine, Ser: 0.74 mg/dL (ref 0.44–1.00)
GFR, Estimated: >60 mL/min (ref >60)
Glucose, Bld: 103 mg/dL — ABNORMAL HIGH (ref 70–99)
Potassium: 3.8 mmol/L (ref 3.5–5.1)
Sodium: 127 mmol/L — ABNORMAL LOW (ref 135–145)

## 2021-04-12 LAB — MAGNESIUM: Magnesium: 1.6 mg/dL — ABNORMAL LOW (ref 1.7–2.4)

## 2021-04-12 MED ORDER — CARVEDILOL 6.25 MG PO TABS
6.2500 mg | ORAL_TABLET | Freq: Two times a day (BID) | ORAL | Status: DC
  Administered 2021-04-12 – 2021-04-14 (×4): 6.25 mg via ORAL
  Filled 2021-04-12 (×4): qty 1

## 2021-04-12 MED ORDER — MAGNESIUM SULFATE 2 GM/50ML IV SOLN
2.0000 g | Freq: Once | INTRAVENOUS | Status: AC
  Administered 2021-04-12: 2 g via INTRAVENOUS
  Filled 2021-04-12: qty 50

## 2021-04-12 NOTE — Progress Notes (Addendum)
PROGRESS NOTE    [name]  QJJ:[id number] DOB: 09/20/1935 DOA: 04/10/2021 PCP: Burnard Bunting, MD  Brief Narrative: 85 year old female with history of hypertension, hyperlipidemia, severe spinal stenosis, partial epilepsy admitted with weakness and loose stools. She was admitted to the hospital in February 2022 with similar complaints and was found to be hyponatremic. Her sodium was 122 on admission and was put on IV fluids and admitted for further work-up.  Assessment & Plan:   Principal Problem:   Hyponatremia Active Problems:   Essential hypertension   Spinal stenosis of lumbar region   Mixed hyperlipidemia   History of complex partial epilepsy   Hypokalemia   #1 hypovolemic hyponatremia patient admitted with diarrhea and low sodium.  Her sodium on admission was 124 and it is trending up to 127.  Continue NS at 75 cc an hour. Follow-up BMP in AM. Urine sodium urine osmolality and serum osmolality.  #2 hypomagnesemia magnesium 1.6 replete.  #3 history of complex partial epilepsy on Dilantin and phenobarbital  #4 hyperlipidemia on Zocor  #5 hypokalemia potassium 3.8 monitor levels in a.m.  #6 history of essential hypertension on Coreg   Estimated body mass index is 24.96 kg/m as calculated from the following:   Height as of this encounter: 5\' 5"  (1.651 m).   Weight as of this encounter: 68 kg.  DVT prophylaxis:lovenox Code Status: dnr Family Communication: none Disposition Plan:  Status is: Inpatient  Remains inpatient appropriate because: hyponatremia   Consultants:  none  Procedures:none Antimicrobials: none Subjective: Feels very weak  Objective: Vitals:   04/11/21 1318 04/11/21 2102 04/12/21 0516 04/12/21 1341  BP: (!) 119/37 (!) 130/41 (!) 152/48 123/60  Pulse: 62 70 76 71  Resp:  17 20 18   Temp: 98.1 F (36.7 C) 99.3 F (37.4 C) 98.2 F (36.8 C) 98.3 F (36.8 C)  TempSrc: Oral Oral Oral Oral  SpO2: 97% 97% 98% 99%  Weight:       Height:        Intake/Output Summary (Last 24 hours) at 04/12/2021 1544 Last data filed at 04/12/2021 0700 Gross per 24 hour  Intake 1602.04 ml  Output --  Net 1602.04 ml   Filed Weights   04/10/21 1008  Weight: 68 kg    Examination:  General exam: Appears calm and comfortable  Respiratory system: Clear to auscultation. Respiratory effort normal. Cardiovascular system: S1 & S2 heard, RRR. No JVD, murmurs, rubs, gallops or clicks. No pedal edema. Gastrointestinal system: Abdomen is nondistended, soft and nontender. No organomegaly or masses felt. Normal bowel sounds heard. Central nervous system: Alert and oriented. No focal neurological deficits. Extremities: Symmetric 5 x 5 power. Skin: No rashes, lesions or ulcers Psychiatry: Judgement and insight appear normal. Mood & affect appropriate.     Data Reviewed: I have personally reviewed following labs and imaging studies  CBC: Recent Labs  Lab 04/10/21 1027 04/11/21 0346 04/12/21 0340  WBC 5.5 5.8 6.8  NEUTROABS  --   --  4.7  HGB 10.2* 9.3* 8.8*  HCT 28.7* 26.7* 25.7*  MCV 92.6 96.4 98.5  PLT 216 187 481   Basic Metabolic Panel: Recent Labs  Lab 04/10/21 1641 04/10/21 1826 04/10/21 2015 04/11/21 0346 04/11/21 1646 04/12/21 0340  NA 125* 124*  --  124* 127* 127*  K 3.3* 3.3*  --  3.5 3.9 3.8  CL 88* 89*  --  90* 93* 95*  CO2 27 27  --  26 26 26   GLUCOSE 107* 118*  --  109* 131* 103*  BUN 13 14  --  15 20 21   CREATININE 0.82 0.74  --  0.70 0.83 0.74  CALCIUM 8.8* 8.7*  --  8.4* 8.7* 8.2*  MG  --   --  1.3* 2.0  --  1.6*   GFR: Estimated Creatinine Clearance: 47.9 mL/min (by C-G formula based on SCr of 0.74 mg/dL). Liver Function Tests: Recent Labs  Lab 04/10/21 1027  AST 20  ALT 11  ALKPHOS 68  BILITOT 0.4  PROT 6.9  ALBUMIN 3.8   Recent Labs  Lab 04/10/21 1027  LIPASE 61*   No results for input(s): AMMONIA in the last 168 hours. Coagulation Profile: No results for input(s): INR, PROTIME  in the last 168 hours. Cardiac Enzymes: No results for input(s): CKTOTAL, CKMB, CKMBINDEX, TROPONINI in the last 168 hours. BNP (last 3 results) No results for input(s): PROBNP in the last 8760 hours. HbA1C: No results for input(s): HGBA1C in the last 72 hours. CBG: No results for input(s): GLUCAP in the last 168 hours. Lipid Profile: No results for input(s): CHOL, HDL, LDLCALC, TRIG, CHOLHDL, LDLDIRECT in the last 72 hours. Thyroid Function Tests: No results for input(s): TSH, T4TOTAL, FREET4, T3FREE, THYROIDAB in the last 72 hours. Anemia Panel: No results for input(s): VITAMINB12, FOLATE, FERRITIN, TIBC, IRON, RETICCTPCT in the last 72 hours. Sepsis Labs: No results for input(s): PROCALCITON, LATICACIDVEN in the last 168 hours.  Recent Results (from the past 240 hour(s))  Resp Panel by RT-PCR (Flu A&B, Covid) Nasopharyngeal Swab     Status: None   Collection Time: 04/10/21 12:41 PM   Specimen: Nasopharyngeal Swab; Nasopharyngeal(NP) swabs in vial transport medium  Result Value Ref Range Status   SARS Coronavirus 2 by RT PCR NEGATIVE NEGATIVE Final    Comment: (NOTE) SARS-CoV-2 target nucleic acids are NOT DETECTED.  The SARS-CoV-2 RNA is generally detectable in upper respiratory specimens during the acute phase of infection. The lowest concentration of SARS-CoV-2 viral copies this assay can detect is 138 copies/mL. A negative result does not preclude SARS-Cov-2 infection and should not be used as the sole basis for treatment or other patient management decisions. A negative result may occur with  improper specimen collection/handling, submission of specimen other than nasopharyngeal swab, presence of viral mutation(s) within the areas targeted by this assay, and inadequate number of viral copies(<138 copies/mL). A negative result must be combined with clinical observations, patient history, and epidemiological information. The expected result is Negative.  Fact Sheet for  Patients:  EntrepreneurPulse.com.au  Fact Sheet for Healthcare Providers:  IncredibleEmployment.be  This test is no t yet approved or cleared by the [place] FDA and  has been authorized for detection and/or diagnosis of SARS-CoV-2 by FDA under an Emergency Use Authorization (EUA). This EUA will remain  in effect (meaning this test can be used) for the duration of the COVID-19 declaration under Section 564(b)(1) of the Act, 21 U.S.C.section 360bbb-3(b)(1), unless the authorization is terminated  or revoked sooner.       Influenza A by PCR NEGATIVE NEGATIVE Final   Influenza B by PCR NEGATIVE NEGATIVE Final    Comment: (NOTE) The Xpert Xpress SARS-CoV-2/FLU/RSV plus assay is intended as an aid in the diagnosis of influenza from Nasopharyngeal swab specimens and should not be used as a sole basis for treatment. Nasal washings and aspirates are unacceptable for Xpert Xpress SARS-CoV-2/FLU/RSV testing.  Fact Sheet for Patients: EntrepreneurPulse.com.au  Fact Sheet for Healthcare Providers: IncredibleEmployment.be  This test is not yet approved  or cleared by the [place] and has been authorized for detection and/or diagnosis of SARS-CoV-2 by FDA under an Emergency Use Authorization (EUA). This EUA will remain in effect (meaning this test can be used) for the duration of the COVID-19 declaration under Section 564(b)(1) of the Act, 21 U.S.C. section 360bbb-3(b)(1), unless the authorization is terminated or revoked.  Performed at [facility], [address] [place], [place] [address]          Radiology Studies: No results found.      Scheduled Meds:  carvedilol  25 mg Oral BID   enoxaparin (LOVENOX) injection  40 mg Subcutaneous Q24H   feeding supplement  237 mL Oral BID BM   PHENobarbital  64.8 mg Oral Daily   phenytoin  200 mg Oral Daily   simvastatin  20 mg  Oral QHS   sodium chloride flush  3 mL Intravenous Q12H   Continuous Infusions:  sodium chloride 75 mL/hr at 04/12/21 1535     LOS: 1 day     Georgette Shell, MD 04/12/2021, 3:44 PM

## 2021-04-12 NOTE — Evaluation (Signed)
Physical Therapy Evaluation Only Patient Details Name: [name] MRN: [id number] DOB: 1936/02/27 Today's Date: 04/12/2021  History of Present Illness  [name] is an 85 yo female who presents from [place] with weakness and loose stools. Pt admitted with hyponatremia, hypokalemia. PMH severe spinal stenosis, hypertension, hyperlipidemia, partial epilepsy  Clinical Impression  Pt ambulating around room and in hallway with RW, supv for IV pole management. Pt with some difficulty completing pericare due to low seated toilet, but otherwise independent with donning socks and self care tasks. Pt states has HEP from [facility] and educated pt on performing exercises as well as ambulating with nursing/mobility specialists while in hospital and pt verbalized agreement. Pt ind with rollator at baseline, still drives, facility provides meals and cleans apartment. No acute PT needs identified, will sign off.     Recommendations for follow up therapy are one component of a multi-disciplinary discharge planning process, led by the attending physician.  Recommendations may be updated based on patient status, additional functional criteria and insurance authorization.  Follow Up Recommendations No PT follow up    Assistance Recommended at Discharge    Functional Status Assessment    Equipment Recommendations  None recommended by PT    Recommendations for Other Services       Precautions / Restrictions Precautions Precautions: Fall Restrictions Weight Bearing Restrictions: No      Mobility  Bed Mobility  General bed mobility comments: in recliner    Transfers Overall transfer level: Modified independent Equipment used: Rolling walker (2 wheels)  General transfer comment: powers to stand from recliner and toilet with BUE assisting on handrests and rail in restroom    Ambulation/Gait Ambulation/Gait assistance: Supervision Gait Distance (Feet): 350 Feet Assistive device: Rolling  walker (2 wheels) Gait Pattern/deviations: WFL(Within Functional Limits);Step-through pattern;Decreased stride length;Narrow base of support Gait velocity: slightly decreased   General Gait Details: step through pattern using RW, able to navigate room and hallway clearing past obstacles, no unsteadiness or LOB, slightly narrow BOS with good bil foot clearance and equal bil step length  Stairs            Wheelchair Mobility    Modified Rankin (Stroke Patients Only)       Balance Overall balance assessment: No apparent balance deficits (not formally assessed)        Pertinent Vitals/Pain Pain Assessment: No/denies pain    Home Living Family/patient expects to be discharged to:: Assisted living Living Arrangements: Alone Available Help at Discharge: Family Type of Home: Apartment Home Access: Level entry       Home Layout: One level Home Equipment: Rollator (4 wheels) Additional Comments: Pt lives at [facility] garden apartment.    Prior Function Prior Level of Function : Independent/Modified Independent  Mobility Comments: Pt reports ind with rollator walker. ADLs Comments: Pt reports facility cleans apartment 1x/month, provides meals and medications. Pt ind with self care tasks.     Hand Dominance   Dominant Hand: Right    Extremity/Trunk Assessment   Upper Extremity Assessment Upper Extremity Assessment: Overall WFL for tasks assessed    Lower Extremity Assessment Lower Extremity Assessment: Overall WFL for tasks assessed (AROM WNL, strength grossly 4/5, reports chronic neuropathy in bil feet/ankles)    Cervical / Trunk Assessment Cervical / Trunk Assessment: Normal  Communication   Communication: No difficulties  Cognition Arousal/Alertness: Awake/alert Behavior During Therapy: WFL for tasks assessed/performed Overall Cognitive Status: Within Functional Limits for tasks assessed     General Comments  Exercises     Assessment/Plan     PT Assessment Patient does not need any further PT services  PT Problem List Decreased activity tolerance       PT Treatment Interventions      PT Goals (Current goals can be found in the Care Plan section)  Acute Rehab PT Goals Patient Stated Goal: return to [facility] PT Goal Formulation: All assessment and education complete, DC therapy    Frequency     Barriers to discharge        Co-evaluation               AM-PAC PT "6 Clicks" Mobility  Outcome Measure Help needed turning from your back to your side while in a flat bed without using bedrails?: None Help needed moving from lying on your back to sitting on the side of a flat bed without using bedrails?: None Help needed moving to and from a bed to a chair (including a wheelchair)?: None Help needed standing up from a chair using your arms (e.g., wheelchair or bedside chair)?: None Help needed to walk in hospital room?: A Little Help needed climbing 3-5 steps with a railing? : A Little 6 Click Score: 22    End of Session   Activity Tolerance: Patient tolerated treatment well Patient left: in chair;with call bell/phone within reach Nurse Communication: Mobility status PT Visit Diagnosis: Other abnormalities of gait and mobility (R26.89)    Time: 2549-8264 PT Time Calculation (min) (ACUTE ONLY): 31 min   Charges:   PT Evaluation $PT Eval Low Complexity: 1 Low PT Treatments $Self Care/Home Management: 8-22         Tori Dominque Levandowski PT, DPT 04/12/21, 11:06 AM

## 2021-04-13 DIAGNOSIS — E871 Hypo-osmolality and hyponatremia: Secondary | ICD-10-CM | POA: Diagnosis not present

## 2021-04-13 LAB — CBC WITH DIFFERENTIAL/PLATELET
Abs Immature Granulocytes: 0.04 10*3/uL (ref 0.00–0.07)
Basophils Absolute: 0 10*3/uL (ref 0.0–0.1)
Basophils Relative: 0 %
Eosinophils Absolute: 0 10*3/uL (ref 0.0–0.5)
Eosinophils Relative: 0 %
HCT: 25.7 % — ABNORMAL LOW (ref 36.0–46.0)
Hemoglobin: 8.7 g/dL — ABNORMAL LOW (ref 12.0–15.0)
Immature Granulocytes: 1 %
Lymphocytes Relative: 10 %
Lymphs Abs: 0.8 10*3/uL (ref 0.7–4.0)
MCH: 33.5 pg (ref 26.0–34.0)
MCHC: 33.9 g/dL (ref 30.0–36.0)
MCV: 98.8 fL (ref 80.0–100.0)
Monocytes Absolute: 1.4 10*3/uL — ABNORMAL HIGH (ref 0.1–1.0)
Monocytes Relative: 17 %
Neutro Abs: 5.9 10*3/uL (ref 1.7–7.7)
Neutrophils Relative %: 72 %
Platelets: 188 10*3/uL (ref 150–400)
RBC: 2.6 MIL/uL — ABNORMAL LOW (ref 3.87–5.11)
RDW: 12.2 % (ref 11.5–15.5)
WBC: 8.1 10*3/uL (ref 4.0–10.5)
nRBC: 0 % (ref 0.0–0.2)

## 2021-04-13 LAB — RESP PANEL BY RT-PCR (FLU A&B, COVID) ARPGX2
Influenza A by PCR: NEGATIVE
Influenza B by PCR: NEGATIVE
SARS Coronavirus 2 by RT PCR: NEGATIVE

## 2021-04-13 LAB — BASIC METABOLIC PANEL
Anion gap: 6 (ref 5–15)
BUN: 25 mg/dL — ABNORMAL HIGH (ref 8–23)
CO2: 26 mmol/L (ref 22–32)
Calcium: 8.4 mg/dL — ABNORMAL LOW (ref 8.9–10.3)
Chloride: 99 mmol/L (ref 98–111)
Creatinine, Ser: 0.8 mg/dL (ref 0.44–1.00)
GFR, Estimated: >60 mL/min (ref >60)
Glucose, Bld: 107 mg/dL — ABNORMAL HIGH (ref 70–99)
Potassium: 4.1 mmol/L (ref 3.5–5.1)
Sodium: 131 mmol/L — ABNORMAL LOW (ref 135–145)

## 2021-04-13 LAB — MAGNESIUM: Magnesium: 1.8 mg/dL (ref 1.7–2.4)

## 2021-04-13 NOTE — Progress Notes (Signed)
Initial Nutrition Assessment  DOCUMENTATION CODES:   Not applicable  INTERVENTION:  - continue Ensure Enlive BID, each supplement provides 350 kcal and 20 grams of protein. - complete NFPE when feasible.    NUTRITION DIAGNOSIS:   Increased nutrient needs related to acute illness as evidenced by estimated needs.  GOAL:   Patient will meet greater than or equal to 90% of their needs  MONITOR:   PO intake, Supplement acceptance, Labs, Weight trends  REASON FOR ASSESSMENT:   Malnutrition Screening Tool  ASSESSMENT:   85 year old female with medical history of HTN, HLD, severe spinal stenosis, and partial epilepsy admitted with weakness and loose stools. She was admitted in 07/2020 with similar complaints and was found to be hyponatremic. In the ED she was found to have serum Na of 122 mmol/l.  Unable to see patient in person. She has not been seen by a [place] RD at any time in the past.   Documented meal intake percentages this admission were 50% of dinner on 11/1 and 50% of breakfast on 11/3.   Ensure Enlive ordered BID on 10/31 and she has accepted all but 1 bottle since that time.   Weight on 10/31 was documented as 150 lb, which appears to be a stated weight. PTA the most recently documented weight was 157 lb on 01/11/21. This indicates 7 lb weight loss (4.43% body weight) in the past 2 months; not significant for time frame.   Per notes: - hypovolemic hyponatremia--improving - hypomagnesemia on admission--repleted  - MD note today indicates patient reported being very weak today    Labs reviewed; Na: 131 mmol/l, BUN: 25 mg/dl, Ca: 8.4 mg/dl. Labs reviewed.   IVF; NS @ 75 ml/hr.     NUTRITION - FOCUSED PHYSICAL EXAM:  Unable to complete at this time.  Diet Order:   Diet Order             Diet Heart Room service appropriate? Yes; Fluid consistency: Thin  Diet effective now                   EDUCATION NEEDS:   No education needs have been  identified at this time  Skin:  Skin Assessment: Reviewed RN Assessment  Last BM:  11/3 (type 5)  Height:   Ht Readings from Last 1 Encounters:  04/10/21 5\' 5"  (1.651 m)    Weight:   Wt Readings from Last 1 Encounters:  04/10/21 68 kg     Estimated Nutritional Needs:  Kcal:  1550-1750 kcal Protein:  70-80 grams Fluid:  >/= 1.5 L/day      Jarome Matin, MS, RD, LDN, CNSC Inpatient Clinical Dietitian RD pager # available in [facility]  After hours/weekend pager # available in [facility]

## 2021-04-13 NOTE — Progress Notes (Signed)
Mobility Specialist - Progress Note    04/13/21 1404  Mobility  Activity Ambulated in hall;Ambulated to bathroom  Level of Assistance Minimal assist, patient does 75% or more  Assistive Device Front wheel walker  Distance Ambulated (ft) 250 ft  Mobility Ambulated with assistance in hallway  Mobility Response Tolerated well  Mobility performed by Mobility specialist  $Mobility charge 1 Mobility   Upon entry pt was agreeable to ambulate and requested to use bathroom prior to entering hallway. Pt experienced 1 LOB episode when standing from toilet, but regained balance using grab bars. Pt then ambulated 250 ft in hallway and reported feeling weaker than yesterday and c/o of pain in L hand. Pt returned to recliner after session and was left with call bell at side, chair alarm on, and family in room.   [place] Specialist Acute Rehabilitation Services Phone: [phone] 04/13/21, 2:07 PM

## 2021-04-13 NOTE — TOC Progression Note (Signed)
Transition of Care [facility]) - Progression Note    Patient Details  Name: [name] MRN: [id number] Date of Birth: Jun 08, 1936  [facility] CM/SW Contact  Leeroy Cha, RN Phone Number: 04/13/2021, 1:39 PM  Clinical Narrative:    [facility] notified/ tct-daughter Anne-informed of transport to be held tomorrow morning.   Expected Discharge Plan: IP Rehab Facility Barriers to Discharge: Barriers Resolved  Expected Discharge Plan and Services Expected Discharge Plan: [place]   Discharge Planning Services: CM Consult   Living arrangements for the past 2 months: [place] ([place])                                       Social Determinants of Health (SDOH) Interventions    Readmission Risk Interventions No flowsheet data found.

## 2021-04-13 NOTE — Progress Notes (Signed)
PROGRESS NOTE    [name]  HCW:[id number] DOB: [birth year] DOA: 04/10/2021 PCP: Burnard Bunting, MD  Brief Narrative: 85 year old female with history of hypertension, hyperlipidemia, severe spinal stenosis, partial epilepsy admitted with weakness and loose stools. She was admitted to the hospital in February 2022 with similar complaints and was found to be hyponatremic. Her sodium was 122 on admission and was put on IV fluids and admitted for further work-up.  Assessment & Plan:   Principal Problem:   Hyponatremia Active Problems:   Essential hypertension   Spinal stenosis of lumbar region   Mixed hyperlipidemia   History of complex partial epilepsy   Hypokalemia   #1 hypovolemic hyponatremia patient admitted with diarrhea and low sodium.  Her sodium on admission was 124 and it is trending up to 131 from 127.   Follow-up BMP in AM. Plan for dc to snf 11/4  #2 hypomagnesemia magnesium 1.8 repleted  #3 history of complex partial epilepsy on Dilantin and phenobarbital  #4 hyperlipidemia on Zocor  #5 hypokalemia  resolved  #6 history of essential hypertension on Coreg   Estimated body mass index is 24.96 kg/m as calculated from the following:   Height as of this encounter: 5\' 5"  (1.651 m).   Weight as of this encounter: 68 kg.  DVT prophylaxis:lovenox Code Status: dnr Family Communication: dw daughter  Disposition Plan:  Status is: Inpatient  Remains inpatient appropriate because: hyponatremia   Consultants:  none  Procedures:none Antimicrobials: none Subjective: She is resting in bed she feels very weak complaining of pain in the left upper extremity where the IV site is  Objective: Vitals:   04/12/21 2016 04/13/21 0446 04/13/21 0900 04/13/21 1400  BP: (!) 141/60 (!) 155/48 (!) 168/54 (!) 151/51  Pulse: 86 85 84 83  Resp: 18 16 17 16   Temp: 98.5 F (36.9 C) 99 F (37.2 C)  99.9 F (37.7 C)  TempSrc: Oral Oral  Oral  SpO2: 96% 97%  96%  Weight:       Height:        Intake/Output Summary (Last 24 hours) at 04/13/2021 1514 Last data filed at 04/13/2021 0930 Gross per 24 hour  Intake 2526.28 ml  Output 1 ml  Net 2525.28 ml    Filed Weights   04/10/21 1008  Weight: 68 kg    Examination:  General exam: Appears calm and comfortable  Respiratory system: Clear to auscultation. Respiratory effort normal. Cardiovascular system: S1 & S2 heard, RRR. No JVD, murmurs, rubs, gallops or clicks. No pedal edema. Gastrointestinal system: Abdomen is nondistended, soft and nontender. No organomegaly or masses felt. Normal bowel sounds heard. Central nervous system: Alert and oriented. No focal neurological deficits. Extremities: Symmetric 5 x 5 power. Skin: No rashes, lesions or ulcers Psychiatry: Judgement and insight appear normal. Mood & affect appropriate.     Data Reviewed: I have personally reviewed following labs and imaging studies  CBC: Recent Labs  Lab 04/10/21 1027 04/11/21 0346 04/12/21 0340 04/13/21 0341  WBC 5.5 5.8 6.8 8.1  NEUTROABS  --   --  4.7 5.9  HGB 10.2* 9.3* 8.8* 8.7*  HCT 28.7* 26.7* 25.7* 25.7*  MCV 92.6 96.4 98.5 98.8  PLT 216 187 176 176    Basic Metabolic Panel: Recent Labs  Lab 04/10/21 1826 04/10/21 2015 04/11/21 0346 04/11/21 1646 04/12/21 0340 04/13/21 0341  NA 124*  --  124* 127* 127* 131*  K 3.3*  --  3.5 3.9 3.8 4.1  CL 89*  --  90* 93* 95* 99  CO2 27  --  26 26 26 26   GLUCOSE 118*  --  109* 131* 103* 107*  BUN 14  --  15 20 21  25*  CREATININE 0.74  --  0.70 0.83 0.74 0.80  CALCIUM 8.7*  --  8.4* 8.7* 8.2* 8.4*  MG  --  1.3* 2.0  --  1.6* 1.8    GFR: Estimated Creatinine Clearance: 47.9 mL/min (by C-G formula based on SCr of 0.8 mg/dL). Liver Function Tests: Recent Labs  Lab 04/10/21 1027  AST 20  ALT 11  ALKPHOS 68  BILITOT 0.4  PROT 6.9  ALBUMIN 3.8    Recent Labs  Lab 04/10/21 1027  LIPASE 61*    No results for input(s): AMMONIA in the last 168  hours. Coagulation Profile: No results for input(s): INR, PROTIME in the last 168 hours. Cardiac Enzymes: No results for input(s): CKTOTAL, CKMB, CKMBINDEX, TROPONINI in the last 168 hours. BNP (last 3 results) No results for input(s): PROBNP in the last 8760 hours. HbA1C: No results for input(s): HGBA1C in the last 72 hours. CBG: No results for input(s): GLUCAP in the last 168 hours. Lipid Profile: No results for input(s): CHOL, HDL, LDLCALC, TRIG, CHOLHDL, LDLDIRECT in the last 72 hours. Thyroid Function Tests: No results for input(s): TSH, T4TOTAL, FREET4, T3FREE, THYROIDAB in the last 72 hours. Anemia Panel: No results for input(s): VITAMINB12, FOLATE, FERRITIN, TIBC, IRON, RETICCTPCT in the last 72 hours. Sepsis Labs: No results for input(s): PROCALCITON, LATICACIDVEN in the last 168 hours.  Recent Results (from the past 240 hour(s))  Resp Panel by RT-PCR (Flu A&B, Covid) Nasopharyngeal Swab     Status: None   Collection Time: 04/10/21 12:41 PM   Specimen: Nasopharyngeal Swab; Nasopharyngeal(NP) swabs in vial transport medium  Result Value Ref Range Status   SARS Coronavirus 2 by RT PCR NEGATIVE NEGATIVE Final    Comment: (NOTE) SARS-CoV-2 target nucleic acids are NOT DETECTED.  The SARS-CoV-2 RNA is generally detectable in upper respiratory specimens during the acute phase of infection. The lowest concentration of SARS-CoV-2 viral copies this assay can detect is 138 copies/mL. A negative result does not preclude SARS-Cov-2 infection and should not be used as the sole basis for treatment or other patient management decisions. A negative result may occur with  improper specimen collection/handling, submission of specimen other than nasopharyngeal swab, presence of viral mutation(s) within the areas targeted by this assay, and inadequate number of viral copies(<138 copies/mL). A negative result must be combined with clinical observations, patient history, and  epidemiological information. The expected result is Negative.  Fact Sheet for Patients:  EntrepreneurPulse.com.au  Fact Sheet for Healthcare Providers:  IncredibleEmployment.be  This test is no t yet approved or cleared by the [place] FDA and  has been authorized for detection and/or diagnosis of SARS-CoV-2 by FDA under an Emergency Use Authorization (EUA). This EUA will remain  in effect (meaning this test can be used) for the duration of the COVID-19 declaration under Section 564(b)(1) of the Act, 21 U.S.C.section 360bbb-3(b)(1), unless the authorization is terminated  or revoked sooner.       Influenza A by PCR NEGATIVE NEGATIVE Final   Influenza B by PCR NEGATIVE NEGATIVE Final    Comment: (NOTE) The Xpert Xpress SARS-CoV-2/FLU/RSV plus assay is intended as an aid in the diagnosis of influenza from Nasopharyngeal swab specimens and should not be used as a sole basis for treatment. Nasal washings and aspirates are unacceptable for Xpert Xpress  SARS-CoV-2/FLU/RSV testing.  Fact Sheet for Patients: EntrepreneurPulse.com.au  Fact Sheet for Healthcare Providers: IncredibleEmployment.be  This test is not yet approved or cleared by the [place] FDA and has been authorized for detection and/or diagnosis of SARS-CoV-2 by FDA under an Emergency Use Authorization (EUA). This EUA will remain in effect (meaning this test can be used) for the duration of the COVID-19 declaration under Section 564(b)(1) of the Act, 21 U.S.C. section 360bbb-3(b)(1), unless the authorization is terminated or revoked.  Performed at [facility], [address], [place], [place] [address]   Resp Panel by RT-PCR (Flu A&B, Covid) Nasopharyngeal Swab     Status: None   Collection Time: 04/13/21  2:05 PM   Specimen: Nasopharyngeal Swab; Nasopharyngeal(NP) swabs in vial transport medium  Result Value Ref  Range Status   SARS Coronavirus 2 by RT PCR NEGATIVE NEGATIVE Final    Comment: (NOTE) SARS-CoV-2 target nucleic acids are NOT DETECTED.  The SARS-CoV-2 RNA is generally detectable in upper respiratory specimens during the acute phase of infection. The lowest concentration of SARS-CoV-2 viral copies this assay can detect is 138 copies/mL. A negative result does not preclude SARS-Cov-2 infection and should not be used as the sole basis for treatment or other patient management decisions. A negative result may occur with  improper specimen collection/handling, submission of specimen other than nasopharyngeal swab, presence of viral mutation(s) within the areas targeted by this assay, and inadequate number of viral copies(<138 copies/mL). A negative result must be combined with clinical observations, patient history, and epidemiological information. The expected result is Negative.  Fact Sheet for Patients:  EntrepreneurPulse.com.au  Fact Sheet for Healthcare Providers:  IncredibleEmployment.be  This test is no t yet approved or cleared by the [place] FDA and  has been authorized for detection and/or diagnosis of SARS-CoV-2 by FDA under an Emergency Use Authorization (EUA). This EUA will remain  in effect (meaning this test can be used) for the duration of the COVID-19 declaration under Section 564(b)(1) of the Act, 21 U.S.C.section 360bbb-3(b)(1), unless the authorization is terminated  or revoked sooner.       Influenza A by PCR NEGATIVE NEGATIVE Final   Influenza B by PCR NEGATIVE NEGATIVE Final    Comment: (NOTE) The Xpert Xpress SARS-CoV-2/FLU/RSV plus assay is intended as an aid in the diagnosis of influenza from Nasopharyngeal swab specimens and should not be used as a sole basis for treatment. Nasal washings and aspirates are unacceptable for Xpert Xpress SARS-CoV-2/FLU/RSV testing.  Fact Sheet for  Patients: EntrepreneurPulse.com.au  Fact Sheet for Healthcare Providers: IncredibleEmployment.be  This test is not yet approved or cleared by the [place] FDA and has been authorized for detection and/or diagnosis of SARS-CoV-2 by FDA under an Emergency Use Authorization (EUA). This EUA will remain in effect (meaning this test can be used) for the duration of the COVID-19 declaration under Section 564(b)(1) of the Act, 21 U.S.C. section 360bbb-3(b)(1), unless the authorization is terminated or revoked.  Performed at [facility], [place] [address] [place], [place] [address]           Radiology Studies: No results found.      Scheduled Meds:  carvedilol  6.25 mg Oral BID   enoxaparin (LOVENOX) injection  40 mg Subcutaneous Q24H   feeding supplement  237 mL Oral BID BM   PHENobarbital  64.8 mg Oral Daily   phenytoin  200 mg Oral Daily   simvastatin  20 mg Oral QHS   sodium chloride  flush  3 mL Intravenous Q12H   Continuous Infusions:  sodium chloride Stopped (04/13/21 0930)     LOS: 2 days     Georgette Shell, MD 04/13/2021, 3:14 PM

## 2021-04-14 DIAGNOSIS — E871 Hypo-osmolality and hyponatremia: Secondary | ICD-10-CM | POA: Diagnosis not present

## 2021-04-14 LAB — BASIC METABOLIC PANEL
Anion gap: 8 (ref 5–15)
BUN: 28 mg/dL — ABNORMAL HIGH (ref 8–23)
CO2: 25 mmol/L (ref 22–32)
Calcium: 8.4 mg/dL — ABNORMAL LOW (ref 8.9–10.3)
Chloride: 98 mmol/L (ref 98–111)
Creatinine, Ser: 0.87 mg/dL (ref 0.44–1.00)
GFR, Estimated: >60 mL/min (ref >60)
Glucose, Bld: 111 mg/dL — ABNORMAL HIGH (ref 70–99)
Potassium: 3.8 mmol/L (ref 3.5–5.1)
Sodium: 131 mmol/L — ABNORMAL LOW (ref 135–145)

## 2021-04-14 LAB — MAGNESIUM: Magnesium: 1.4 mg/dL — ABNORMAL LOW (ref 1.7–2.4)

## 2021-04-14 LAB — CBC WITH DIFFERENTIAL/PLATELET
Abs Immature Granulocytes: 0.03 10*3/uL (ref 0.00–0.07)
Basophils Absolute: 0 10*3/uL (ref 0.0–0.1)
Basophils Relative: 1 %
Eosinophils Absolute: 0 10*3/uL (ref 0.0–0.5)
Eosinophils Relative: 0 %
HCT: 24.8 % — ABNORMAL LOW (ref 36.0–46.0)
Hemoglobin: 8.6 g/dL — ABNORMAL LOW (ref 12.0–15.0)
Immature Granulocytes: 0 %
Lymphocytes Relative: 13 %
Lymphs Abs: 0.9 10*3/uL (ref 0.7–4.0)
MCH: 33.7 pg (ref 26.0–34.0)
MCHC: 34.7 g/dL (ref 30.0–36.0)
MCV: 97.3 fL (ref 80.0–100.0)
Monocytes Absolute: 1.3 10*3/uL — ABNORMAL HIGH (ref 0.1–1.0)
Monocytes Relative: 18 %
Neutro Abs: 5 10*3/uL (ref 1.7–7.7)
Neutrophils Relative %: 68 %
Platelets: 190 10*3/uL (ref 150–400)
RBC: 2.55 MIL/uL — ABNORMAL LOW (ref 3.87–5.11)
RDW: 12.4 % (ref 11.5–15.5)
WBC: 7.3 10*3/uL (ref 4.0–10.5)
nRBC: 0 % (ref 0.0–0.2)

## 2021-04-14 MED ORDER — SENNOSIDES-DOCUSATE SODIUM 8.6-50 MG PO TABS: 1.0000 | ORAL_TABLET | Freq: Every evening | ORAL | Status: DC | PRN

## 2021-04-14 MED ORDER — ONDANSETRON HCL 4 MG PO TABS: 4.0000 mg | ORAL_TABLET | Freq: Four times a day (QID) | ORAL | 0 refills | Status: DC | PRN

## 2021-04-14 MED ORDER — MAGNESIUM OXIDE -MG SUPPLEMENT 400 (240 MG) MG PO TABS
800.0000 mg | ORAL_TABLET | Freq: Two times a day (BID) | ORAL | Status: DC
  Administered 2021-04-14: 800 mg via ORAL
  Filled 2021-04-14: qty 2

## 2021-04-14 MED ORDER — MAGNESIUM SULFATE 4 GM/100ML IV SOLN
4.0000 g | Freq: Once | INTRAVENOUS | Status: DC
  Filled 2021-04-14: qty 100

## 2021-04-14 MED ORDER — MAGNESIUM OXIDE -MG SUPPLEMENT 400 (240 MG) MG PO TABS
400.0000 mg | ORAL_TABLET | Freq: Once | ORAL | Status: AC
  Administered 2021-04-14: 400 mg via ORAL
  Filled 2021-04-14: qty 1

## 2021-04-14 MED ORDER — MAGNESIUM OXIDE -MG SUPPLEMENT 400 (240 MG) MG PO TABS: 800.0000 mg | ORAL_TABLET | Freq: Two times a day (BID) | ORAL | Status: DC

## 2021-04-14 NOTE — Progress Notes (Signed)
Report called in to [place] at [facility]. Patient daughter updated, waiting on [facility] to pick up patient.

## 2021-04-14 NOTE — Discharge Summary (Signed)
Physician Discharge Summary  [name] BZJ:[id number] DOB: Feb 19, 1936 DOA: 04/10/2021  PCP: Burnard Bunting, MD  Admit date: 04/10/2021 Discharge date: 04/14/2021  Admitted From: [facility] Disposition:  [facility]  Recommendations for Outpatient Follow-up:  Follow up with PCP in 1-2 weeks Please obtain BMP/CBC/MAG in one week  Home Health:none Equipment/Devices:none Discharge Condition:stable  CODE STATUS:dnr Diet recommendation: cardiac Brief/Interim Summary:85 year old female with history of hypertension, hyperlipidemia, severe spinal stenosis, partial epilepsy admitted with weakness and loose stools. She was admitted to the hospital in February 2022 with similar complaints and was found to be hyponatremic. Her sodium was 122 on admission and was put on IV fluids and admitted for further work-up.  Discharge Diagnoses:  Principal Problem:   Hyponatremia Active Problems:   Essential hypertension   Spinal stenosis of lumbar region   Mixed hyperlipidemia   History of complex partial epilepsy   Hypokalemia    #1 hypovolemic hyponatremia patient admitted with diarrhea and low sodium.  Sodium improved with hydration with normal saline.  Sodium on discharge is 131.  Please check a BMP within 5 days.  Encourage p.o. intake.  #2 hypomagnesemia she needs daily repletion of magnesium.  Keep her on magnesium oxide 800 mg twice a day.  #3 history of complex partial epilepsy on Dilantin and phenobarbital   #4 hyperlipidemia on Zocor   #5 hypokalemia  resolved   #6 history of essential hypertension on Coreg    Nutrition Problem: Increased nutrient needs Etiology: acute illness    Signs/Symptoms: estimated needs     Interventions: Ensure Enlive (each supplement provides 350kcal and 20 grams of protein)  Estimated body mass index is 24.96 kg/m as calculated from the following:   Height as of this encounter: 5\' 5"  (1.651 m).   Weight as of this encounter: 68  kg.  Discharge Instructions  Discharge Instructions     Diet - low sodium heart healthy   Complete by: As directed    Increase activity slowly   Complete by: As directed       Allergies as of 04/14/2021       Reactions   Azithromycin Other (See Comments)   Reaction not recalled   Penicillins Hives, Other (See Comments)   Bad reaction as a child after a shot of Penicillin   Pravastatin Other (See Comments)   Reaction not recalled   Sulfa Drugs Cross Reactors Other (See Comments)   Reaction not recalled   Naproxen Sodium Rash        Medication List     STOP taking these medications    ferrous sulfate 325 (65 FE) MG tablet       TAKE these medications    acetaminophen 500 MG tablet Commonly known as: TYLENOL Take 500 mg by mouth every 6 (six) hours as needed for mild pain or headache.   carvedilol 25 MG tablet Commonly known as: COREG TAKE 1 TABLET BY MOUTH TWICE DAILY. What changed: when to take this   fluticasone 50 MCG/ACT nasal spray Commonly known as: FLONASE Place 1-2 sprays into both nostrils in the morning.   magnesium oxide 400 (240 Mg) MG tablet Commonly known as: MAG-OX Take 2 tablets (800 mg total) by mouth 2 (two) times daily.   Multi Complete/Iron Tabs Take 1 tablet by mouth daily with breakfast.   ondansetron 4 MG tablet Commonly known as: ZOFRAN Take 1 tablet (4 mg total) by mouth every 6 (six) hours as needed for nausea.   PHENobarbital 32.4 MG tablet Commonly  known as: LUMINAL Take 64.8 mg by mouth daily with breakfast.   phenytoin 100 MG ER capsule Commonly known as: DILANTIN Take 200 mg by mouth daily with breakfast.   senna-docusate 8.6-50 MG tablet Commonly known as: Senokot-S Take 1 tablet by mouth at bedtime as needed for mild constipation.   simvastatin 20 MG tablet Commonly known as: ZOCOR Take 20 mg by mouth at bedtime.   Systane Ultra PF 0.4-0.3 % Soln Generic drug: Polyethyl Glyc-Propyl Glyc PF Place 1 drop  into both eyes in the morning and at bedtime.   Vitamin-B Complex Tabs Take 1 tablet by mouth daily.        Allergies  Allergen Reactions   Azithromycin Other (See Comments)    Reaction not recalled   Penicillins Hives and Other (See Comments)    Bad reaction as a child after a shot of Penicillin   Pravastatin Other (See Comments)    Reaction not recalled   Sulfa Drugs Cross Reactors Other (See Comments)    Reaction not recalled   Naproxen Sodium Rash    Consultations: None   Procedures/Studies: No results found. (Echo, Carotid, EGD, Colonoscopy, ERCP)    Subjective: Patient is resting in bed appears weak complains of generalized weakness   Discharge Exam: Vitals:   04/14/21 0531 04/14/21 0921  BP: (!) 147/45 (!) 150/46  Pulse:  78  Resp:  18  Temp:  98 F (36.7 C)  SpO2:  96%   Vitals:   04/13/21 2138 04/14/21 0454 04/14/21 0531 04/14/21 0921  BP: (!) 143/39 (!) 168/58 (!) 147/45 (!) 150/46  Pulse: 82 84  78  Resp: 20 20  18   Temp: 99.3 F (37.4 C) 98.4 F (36.9 C)  98 F (36.7 C)  TempSrc: Oral Oral  Oral  SpO2: 97% 98%  96%  Weight:      Height:        General: Pt is alert, awake, not in acute distress Cardiovascular: RRR, S1/S2 +, no rubs, no gallops Respiratory: CTA bilaterally, no wheezing, no rhonchi Abdominal: Soft, NT, ND, bowel sounds + Extremities: no edema, no cyanosis    The results of significant diagnostics from this hospitalization (including imaging, microbiology, ancillary and laboratory) are listed below for reference.     Microbiology: Recent Results (from the past 240 hour(s))  Resp Panel by RT-PCR (Flu A&B, Covid) Nasopharyngeal Swab     Status: None   Collection Time: 04/10/21 12:41 PM   Specimen: Nasopharyngeal Swab; Nasopharyngeal(NP) swabs in vial transport medium  Result Value Ref Range Status   SARS Coronavirus 2 by RT PCR NEGATIVE NEGATIVE Final    Comment: (NOTE) SARS-CoV-2 target nucleic acids are NOT  DETECTED.  The SARS-CoV-2 RNA is generally detectable in upper respiratory specimens during the acute phase of infection. The lowest concentration of SARS-CoV-2 viral copies this assay can detect is 138 copies/mL. A negative result does not preclude SARS-Cov-2 infection and should not be used as the sole basis for treatment or other patient management decisions. A negative result may occur with  improper specimen collection/handling, submission of specimen other than nasopharyngeal swab, presence of viral mutation(s) within the areas targeted by this assay, and inadequate number of viral copies(<138 copies/mL). A negative result must be combined with clinical observations, patient history, and epidemiological information. The expected result is Negative.  Fact Sheet for Patients:  EntrepreneurPulse.com.au  Fact Sheet for Healthcare Providers:  IncredibleEmployment.be  This test is no t yet approved or cleared by the [place] FDA  and  has been authorized for detection and/or diagnosis of SARS-CoV-2 by FDA under an Emergency Use Authorization (EUA). This EUA will remain  in effect (meaning this test can be used) for the duration of the COVID-19 declaration under Section 564(b)(1) of the Act, 21 U.S.C.section 360bbb-3(b)(1), unless the authorization is terminated  or revoked sooner.       Influenza A by PCR NEGATIVE NEGATIVE Final   Influenza B by PCR NEGATIVE NEGATIVE Final    Comment: (NOTE) The Xpert Xpress SARS-CoV-2/FLU/RSV plus assay is intended as an aid in the diagnosis of influenza from Nasopharyngeal swab specimens and should not be used as a sole basis for treatment. Nasal washings and aspirates are unacceptable for Xpert Xpress SARS-CoV-2/FLU/RSV testing.  Fact Sheet for Patients: EntrepreneurPulse.com.au  Fact Sheet for Healthcare Providers: IncredibleEmployment.be  This test is not yet  approved or cleared by the [place] FDA and has been authorized for detection and/or diagnosis of SARS-CoV-2 by FDA under an Emergency Use Authorization (EUA). This EUA will remain in effect (meaning this test can be used) for the duration of the COVID-19 declaration under Section 564(b)(1) of the Act, 21 U.S.C. section 360bbb-3(b)(1), unless the authorization is terminated or revoked.  Performed at [facility], [address] [place], [place] [address]   Resp Panel by RT-PCR (Flu A&B, Covid) Nasopharyngeal Swab     Status: None   Collection Time: 04/13/21  2:05 PM   Specimen: Nasopharyngeal Swab; Nasopharyngeal(NP) swabs in vial transport medium  Result Value Ref Range Status   SARS Coronavirus 2 by RT PCR NEGATIVE NEGATIVE Final    Comment: (NOTE) SARS-CoV-2 target nucleic acids are NOT DETECTED.  The SARS-CoV-2 RNA is generally detectable in upper respiratory specimens during the acute phase of infection. The lowest concentration of SARS-CoV-2 viral copies this assay can detect is 138 copies/mL. A negative result does not preclude SARS-Cov-2 infection and should not be used as the sole basis for treatment or other patient management decisions. A negative result may occur with  improper specimen collection/handling, submission of specimen other than nasopharyngeal swab, presence of viral mutation(s) within the areas targeted by this assay, and inadequate number of viral copies(<138 copies/mL). A negative result must be combined with clinical observations, patient history, and epidemiological information. The expected result is Negative.  Fact Sheet for Patients:  EntrepreneurPulse.com.au  Fact Sheet for Healthcare Providers:  IncredibleEmployment.be  This test is no t yet approved or cleared by the [place] FDA and  has been authorized for detection and/or diagnosis of SARS-CoV-2 by FDA under an Emergency Use  Authorization (EUA). This EUA will remain  in effect (meaning this test can be used) for the duration of the COVID-19 declaration under Section 564(b)(1) of the Act, 21 U.S.C.section 360bbb-3(b)(1), unless the authorization is terminated  or revoked sooner.       Influenza A by PCR NEGATIVE NEGATIVE Final   Influenza B by PCR NEGATIVE NEGATIVE Final    Comment: (NOTE) The Xpert Xpress SARS-CoV-2/FLU/RSV plus assay is intended as an aid in the diagnosis of influenza from Nasopharyngeal swab specimens and should not be used as a sole basis for treatment. Nasal washings and aspirates are unacceptable for Xpert Xpress SARS-CoV-2/FLU/RSV testing.  Fact Sheet for Patients: EntrepreneurPulse.com.au  Fact Sheet for Healthcare Providers: IncredibleEmployment.be  This test is not yet approved or cleared by the [place] FDA and has been authorized for detection and/or diagnosis of SARS-CoV-2 by FDA under an Emergency Use Authorization (EUA). This EUA will  remain in effect (meaning this test can be used) for the duration of the COVID-19 declaration under Section 564(b)(1) of the Act, 21 U.S.C. section 360bbb-3(b)(1), unless the authorization is terminated or revoked.  Performed at [facility], [place] [address], [place], [place] [address]      Labs: BNP (last 3 results) No results for input(s): BNP in the last 8760 hours. Basic Metabolic Panel: Recent Labs  Lab 04/10/21 2015 04/11/21 0346 04/11/21 1646 04/12/21 0340 04/13/21 0341 04/14/21 0338  NA  --  124* 127* 127* 131* 131*  K  --  3.5 3.9 3.8 4.1 3.8  CL  --  90* 93* 95* 99 98  CO2  --  26 26 26 26 25   GLUCOSE  --  109* 131* 103* 107* 111*  BUN  --  15 20 21  25* 28*  CREATININE  --  0.70 0.83 0.74 0.80 0.87  CALCIUM  --  8.4* 8.7* 8.2* 8.4* 8.4*  MG 1.3* 2.0  --  1.6* 1.8 1.4*   Liver Function Tests: Recent Labs  Lab 04/10/21 1027  AST 20  ALT 11  ALKPHOS  68  BILITOT 0.4  PROT 6.9  ALBUMIN 3.8   Recent Labs  Lab 04/10/21 1027  LIPASE 61*   No results for input(s): AMMONIA in the last 168 hours. CBC: Recent Labs  Lab 04/10/21 1027 04/11/21 0346 04/12/21 0340 04/13/21 0341 04/14/21 0338  WBC 5.5 5.8 6.8 8.1 7.3  NEUTROABS  --   --  4.7 5.9 5.0  HGB 10.2* 9.3* 8.8* 8.7* 8.6*  HCT 28.7* 26.7* 25.7* 25.7* 24.8*  MCV 92.6 96.4 98.5 98.8 97.3  PLT 216 187 176 188 190   Cardiac Enzymes: No results for input(s): CKTOTAL, CKMB, CKMBINDEX, TROPONINI in the last 168 hours. BNP: Invalid input(s): POCBNP CBG: No results for input(s): GLUCAP in the last 168 hours. D-Dimer No results for input(s): DDIMER in the last 72 hours. Hgb A1c No results for input(s): HGBA1C in the last 72 hours. Lipid Profile No results for input(s): CHOL, HDL, LDLCALC, TRIG, CHOLHDL, LDLDIRECT in the last 72 hours. Thyroid function studies No results for input(s): TSH, T4TOTAL, T3FREE, THYROIDAB in the last 72 hours.  Invalid input(s): FREET3 Anemia work up No results for input(s): VITAMINB12, FOLATE, FERRITIN, TIBC, IRON, RETICCTPCT in the last 72 hours. Urinalysis    Component Value Date/Time   COLORURINE COLORLESS (A) 04/10/2021 1241   APPEARANCEUR CLEAR 04/10/2021 1241   LABSPEC 1.005 04/10/2021 1241   PHURINE 7.0 04/10/2021 1241   GLUCOSEU NEGATIVE 04/10/2021 1241   HGBUR NEGATIVE 04/10/2021 1241   BILIRUBINUR NEGATIVE 04/10/2021 1241   KETONESUR NEGATIVE 04/10/2021 1241   PROTEINUR NEGATIVE 04/10/2021 1241   NITRITE NEGATIVE 04/10/2021 1241   LEUKOCYTESUR NEGATIVE 04/10/2021 1241   Sepsis Labs Invalid input(s): PROCALCITONIN,  WBC,  LACTICIDVEN Microbiology Recent Results (from the past 240 hour(s))  Resp Panel by RT-PCR (Flu A&B, Covid) Nasopharyngeal Swab     Status: None   Collection Time: 04/10/21 12:41 PM   Specimen: Nasopharyngeal Swab; Nasopharyngeal(NP) swabs in vial transport medium  Result Value Ref Range Status   SARS  Coronavirus 2 by RT PCR NEGATIVE NEGATIVE Final    Comment: (NOTE) SARS-CoV-2 target nucleic acids are NOT DETECTED.  The SARS-CoV-2 RNA is generally detectable in upper respiratory specimens during the acute phase of infection. The lowest concentration of SARS-CoV-2 viral copies this assay can detect is 138 copies/mL. A negative result does not preclude SARS-Cov-2 infection and should not be  used as the sole basis for treatment or other patient management decisions. A negative result may occur with  improper specimen collection/handling, submission of specimen other than nasopharyngeal swab, presence of viral mutation(s) within the areas targeted by this assay, and inadequate number of viral copies(<138 copies/mL). A negative result must be combined with clinical observations, patient history, and epidemiological information. The expected result is Negative.  Fact Sheet for Patients:  EntrepreneurPulse.com.au  Fact Sheet for Healthcare Providers:  IncredibleEmployment.be  This test is no t yet approved or cleared by the [place] FDA and  has been authorized for detection and/or diagnosis of SARS-CoV-2 by FDA under an Emergency Use Authorization (EUA). This EUA will remain  in effect (meaning this test can be used) for the duration of the COVID-19 declaration under Section 564(b)(1) of the Act, 21 U.S.C.section 360bbb-3(b)(1), unless the authorization is terminated  or revoked sooner.       Influenza A by PCR NEGATIVE NEGATIVE Final   Influenza B by PCR NEGATIVE NEGATIVE Final    Comment: (NOTE) The Xpert Xpress SARS-CoV-2/FLU/RSV plus assay is intended as an aid in the diagnosis of influenza from Nasopharyngeal swab specimens and should not be used as a sole basis for treatment. Nasal washings and aspirates are unacceptable for Xpert Xpress SARS-CoV-2/FLU/RSV testing.  Fact Sheet for  Patients: EntrepreneurPulse.com.au  Fact Sheet for Healthcare Providers: IncredibleEmployment.be  This test is not yet approved or cleared by the [place] FDA and has been authorized for detection and/or diagnosis of SARS-CoV-2 by FDA under an Emergency Use Authorization (EUA). This EUA will remain in effect (meaning this test can be used) for the duration of the COVID-19 declaration under Section 564(b)(1) of the Act, 21 U.S.C. section 360bbb-3(b)(1), unless the authorization is terminated or revoked.  Performed at [facility], [address], [place], [place] [address]   Resp Panel by RT-PCR (Flu A&B, Covid) Nasopharyngeal Swab     Status: None   Collection Time: 04/13/21  2:05 PM   Specimen: Nasopharyngeal Swab; Nasopharyngeal(NP) swabs in vial transport medium  Result Value Ref Range Status   SARS Coronavirus 2 by RT PCR NEGATIVE NEGATIVE Final    Comment: (NOTE) SARS-CoV-2 target nucleic acids are NOT DETECTED.  The SARS-CoV-2 RNA is generally detectable in upper respiratory specimens during the acute phase of infection. The lowest concentration of SARS-CoV-2 viral copies this assay can detect is 138 copies/mL. A negative result does not preclude SARS-Cov-2 infection and should not be used as the sole basis for treatment or other patient management decisions. A negative result may occur with  improper specimen collection/handling, submission of specimen other than nasopharyngeal swab, presence of viral mutation(s) within the areas targeted by this assay, and inadequate number of viral copies(<138 copies/mL). A negative result must be combined with clinical observations, patient history, and epidemiological information. The expected result is Negative.  Fact Sheet for Patients:  EntrepreneurPulse.com.au  Fact Sheet for Healthcare Providers:  IncredibleEmployment.be  This  test is no t yet approved or cleared by the [place] FDA and  has been authorized for detection and/or diagnosis of SARS-CoV-2 by FDA under an Emergency Use Authorization (EUA). This EUA will remain  in effect (meaning this test can be used) for the duration of the COVID-19 declaration under Section 564(b)(1) of the Act, 21 U.S.C.section 360bbb-3(b)(1), unless the authorization is terminated  or revoked sooner.       Influenza A by PCR NEGATIVE NEGATIVE Final   Influenza B by PCR NEGATIVE NEGATIVE  Final    Comment: (NOTE) The Xpert Xpress SARS-CoV-2/FLU/RSV plus assay is intended as an aid in the diagnosis of influenza from Nasopharyngeal swab specimens and should not be used as a sole basis for treatment. Nasal washings and aspirates are unacceptable for Xpert Xpress SARS-CoV-2/FLU/RSV testing.  Fact Sheet for Patients: EntrepreneurPulse.com.au  Fact Sheet for Healthcare Providers: IncredibleEmployment.be  This test is not yet approved or cleared by the [place] FDA and has been authorized for detection and/or diagnosis of SARS-CoV-2 by FDA under an Emergency Use Authorization (EUA). This EUA will remain in effect (meaning this test can be used) for the duration of the COVID-19 declaration under Section 564(b)(1) of the Act, 21 U.S.C. section 360bbb-3(b)(1), unless the authorization is terminated or revoked.  Performed at [facility], [place] [address], [place], [place] [address]      Time coordinating discharge: 38 minutes  SIGNED:   Georgette Shell, MD  [facility] 04/14/2021, 12:01 PM

## 2021-04-14 NOTE — Care Management Important Message (Signed)
Important Message  Patient Details IM Letter placed in Patients room. Name: [name] MRN: [id number] Date of Birth: 1936/04/21   Medicare Important Message Given:  Yes     Kerin Salen 04/14/2021, 1:59 PM

## 2021-04-14 NOTE — TOC Transition Note (Signed)
Transition of Care [facility]) - CM/SW Discharge Note   Patient Details  Name: [name] MRN: [id number] Date of Birth: Oct 26, 1935  [facility] CM/SW Contact:  Leeroy Cha, RN Phone Number: 04/14/2021, 11:57 AM   Clinical Narrative:    PATIENT DCD TO [facility] VIA [facility], p[facility] called for transport at 1155.  Packet to unit clerk.   Final next level of care: IP Rehab Facility Barriers to Discharge: Barriers Resolved   Patient Goals and CMS Choice Patient states their goals for this hospitalization and ongoing recovery are:: to return to my home at [facility] CMS Medicare.gov Compare Post Acute Care list provided to:: Patient    Discharge Placement                       Discharge Plan and Services   Discharge Planning Services: CM Consult                                 Social Determinants of Health (SDOH) Interventions     Readmission Risk Interventions No flowsheet data found.

## 2021-04-14 NOTE — Progress Notes (Signed)
Patient discharged to [facility] rehab via [facility], discharge packet prepared and given to [facility] for facility. No wound or pressure injury noted. Patient denies any distress, daughter at the bedside during transportation.

## 2021-04-17 ENCOUNTER — Non-Acute Institutional Stay (SKILLED_NURSING_FACILITY): Payer: Medicare Other | Admitting: Internal Medicine

## 2021-04-17 ENCOUNTER — Encounter: Payer: Self-pay | Admitting: Internal Medicine

## 2021-04-17 DIAGNOSIS — E782 Mixed hyperlipidemia: Secondary | ICD-10-CM | POA: Diagnosis not present

## 2021-04-17 DIAGNOSIS — M48061 Spinal stenosis, lumbar region without neurogenic claudication: Secondary | ICD-10-CM | POA: Diagnosis not present

## 2021-04-17 DIAGNOSIS — I10 Essential (primary) hypertension: Secondary | ICD-10-CM

## 2021-04-17 DIAGNOSIS — G40209 Localization-related (focal) (partial) symptomatic epilepsy and epileptic syndromes with complex partial seizures, not intractable, without status epilepticus: Secondary | ICD-10-CM | POA: Diagnosis not present

## 2021-04-17 DIAGNOSIS — E871 Hypo-osmolality and hyponatremia: Secondary | ICD-10-CM

## 2021-04-17 DIAGNOSIS — D508 Other iron deficiency anemias: Secondary | ICD-10-CM | POA: Diagnosis not present

## 2021-04-17 DIAGNOSIS — R197 Diarrhea, unspecified: Secondary | ICD-10-CM

## 2021-04-17 DIAGNOSIS — R531 Weakness: Secondary | ICD-10-CM | POA: Diagnosis not present

## 2021-04-17 DIAGNOSIS — E870 Hyperosmolality and hypernatremia: Secondary | ICD-10-CM | POA: Diagnosis not present

## 2021-04-17 DIAGNOSIS — M4807 Spinal stenosis, lumbosacral region: Secondary | ICD-10-CM | POA: Diagnosis not present

## 2021-04-17 DIAGNOSIS — R278 Other lack of coordination: Secondary | ICD-10-CM | POA: Diagnosis not present

## 2021-04-17 DIAGNOSIS — R2689 Other abnormalities of gait and mobility: Secondary | ICD-10-CM | POA: Diagnosis not present

## 2021-04-17 LAB — BASIC METABOLIC PANEL
BUN: 24 — AB (ref 4–21)
BUN: 24 — AB (ref 4–21)
CO2: 26 — AB (ref 13–22)
CO2: 26 — AB (ref 13–22)
Chloride: 96 — AB (ref 99–108)
Chloride: 96 — AB (ref 99–108)
Creatinine: 0.9 (ref 0.5–1.1)
Creatinine: 0.9 (ref 0.5–1.1)
Glucose: 128
Glucose: 128
Potassium: 4 (ref 3.4–5.3)
Potassium: 4 (ref 3.4–5.3)
Sodium: 134 — AB (ref 137–147)
Sodium: 134 — AB (ref 137–147)

## 2021-04-17 LAB — CBC AND DIFFERENTIAL
HCT: 27 — AB (ref 36–46)
HCT: 27 — AB (ref 36–46)
Hemoglobin: 9.4 — AB (ref 12.0–16.0)
Hemoglobin: 9.4 — AB (ref 12.0–16.0)
Platelets: 220 (ref 150–399)
Platelets: 220 (ref 150–399)
WBC: 4.9
WBC: 4.9

## 2021-04-17 LAB — COMPREHENSIVE METABOLIC PANEL
Calcium: 8.9 (ref 8.7–10.7)
Calcium: 8.9 (ref 8.7–10.7)
GFR calc Af Amer: 69.68
GFR calc non Af Amer: 60.12

## 2021-04-17 LAB — CBC
RBC: 2.73 — AB (ref 3.87–5.11)
RBC: 2.73 — AB (ref 3.87–5.11)

## 2021-04-17 LAB — URIC ACID: Uric Acid: 6.3

## 2021-04-17 NOTE — Progress Notes (Signed)
Provider:  Veleta Miners MD Location:   [place] Room Number: 295 Place of Service:  SNF ([id number])  PCP: Burnard Bunting, MD Patient Care Team: Burnard Bunting, MD as PCP - General (Internal Medicine)  Extended Emergency Contact Information Primary Emergency Contact: [place] Mobile Phone: [phone] Relation: Daughter Secondary Emergency Contact: HEPHZIBAH, STREHLE Mobile Phone: [phone] Relation: Son  Code Status: DNR Goals of Care: Advanced Directive information Advanced Directives 04/17/2021  Does Patient Have a Medical Advance Directive? Yes  Type of Paramedic of [place];Out of facility DNR (pink MOST or yellow form)  Does patient want to make changes to medical advance directive? No - Patient declined  Copy of [place] in Chart? No - copy requested      Chief Complaint  Patient presents with   New Admit To SNF    Admission to SNF    HPI: Patient is a 85 y.o. female seen today for admission to SNF for therapy  Admitted in the hospital from 10/31-11/04 for Hyonatremia due to Dehydration worsen by Diarrhea  Patient has a history of hypertension, spinal stenosis with back pain, hyperlipidemia, history of complex partial epilepsy, hypokalemia  Patient was admitted in the hospital after she presented with weakness.  Per patient she was not feeling well for 3 to 4 days before going to ED.  She states she has not been having any appetite for the past few weeks.  No nausea but just dry heaves.  No abdominal pain or vomiting. But she was having loose stool for 3 to 4 days.  In ED her sodium was 122 when she was admitted She was hydrated with normal saline on day of discharge her sodium came back to 131.  Patient was admitted in the 2/22 for similar episode of diarrhea and hyponatremia She says she got better after that but it took some time  Patient now in SNF continues to complain of  loose stools at least once a day.  Continues to not have any appetite.  No abdominal pain no nausea or vomiting.  No fever no dysuria continues to feel weak.  Before this patient lives by herself in apartment.  Son in [place] and daughter in a [place] with her walker and was driving Past Medical History:  Diagnosis Date   Abnormal cardiovascular stress test    Carotid arterial disease (HCC)    MODERATE RIGHT   Cerebral AV malformation    DDD (degenerative disc disease)    SEVERE WITH SPINAL STENOSIS   Hypercholesterolemia    Hypertension    MVP (mitral valve prolapse)    MILD   Numbness    LEFT FOOT AND ANKLE   Osteopenia    Pseudo-gout    Past Surgical History:  Procedure Laterality Date   APPENDECTOMY     BREAST MASS EXCISION     CARPAL TUNNEL RELEASE     REMOVAL OF PARATHYROID GLAND     STRESS NUCLEAR STUDY     ABNORMAL   TONSILLECTOMY      reports that she quit smoking about 52 years ago. Her smoking use included cigarettes. She has a 14.00 pack-year smoking history. She has never used smokeless tobacco. She reports that she does not drink alcohol and does not use drugs. Social History   Socioeconomic History   Marital status: Widowed    Spouse name: Not on file   Number of children: 3   Years of education: Not on file  Highest education level: Not on file  Occupational History   Not on file  Tobacco Use   Smoking status: Former    Packs/day: 1.00    Years: 14.00    Pack years: 14.00    Types: Cigarettes    Quit date: 11/07/1968    Years since quitting: 52.4   Smokeless tobacco: Never  Vaping Use   Vaping Use: Never used  Substance and Sexual Activity   Alcohol use: No   Drug use: No   Sexual activity: Not on file  Other Topics Concern   Not on file  Social History Narrative   [place] home [place] Strain: Not on file  Food Insecurity: Not on file  Transportation Needs: Not on  file  Physical Activity: Not on file  Stress: Not on file  Social Connections: Not on file  Intimate Partner Violence: Not on file    Functional Status Survey:    Family History  Problem Relation Age of Onset   Stroke Mother    Lung cancer Father    Breast cancer Neg Hx     Health Maintenance  Topic Date Due   TETANUS/TDAP  Never done   DEXA SCAN  Never done   Pneumonia Vaccine 60+ Years old (2 - PPSV23 if available, else PCV20) 11/26/2015   Zoster Vaccines- Shingrix (2 of 2) 03/06/2019   COVID-19 Vaccine (5 - Booster for Moderna series) 06/21/2020   INFLUENZA VACCINE  01/09/2021   HPV VACCINES  Aged Out    Allergies  Allergen Reactions   Azithromycin Other (See Comments)    Reaction not recalled   Penicillins Hives and Other (See Comments)    Bad reaction as a child after a shot of Penicillin   Pravastatin Other (See Comments)    Reaction not recalled   Sulfa Drugs Cross Reactors Other (See Comments)    Reaction not recalled   Naproxen Sodium Rash    Allergies as of 04/17/2021       Reactions   Azithromycin Other (See Comments)   Reaction not recalled   Penicillins Hives, Other (See Comments)   Bad reaction as a child after a shot of Penicillin   Pravastatin Other (See Comments)   Reaction not recalled   Sulfa Drugs Cross Reactors Other (See Comments)   Reaction not recalled   Naproxen Sodium Rash        Medication List        Accurate as of April 17, 2021  9:49 AM. If you have any questions, ask your nurse or doctor.          acetaminophen 500 MG tablet Commonly known as: TYLENOL Take 500 mg by mouth every 6 (six) hours as needed for mild pain or headache.   carvedilol 25 MG tablet Commonly known as: COREG Take 25 mg by mouth 2 (two) times daily with a meal. What changed: Another medication with the same name was removed. Continue taking this medication, and follow the directions you see here. Changed by: Virgie Dad, MD   fluticasone  50 MCG/ACT nasal spray Commonly known as: FLONASE Place 1-2 sprays into both nostrils in the morning.   magnesium oxide 400 (240 Mg) MG tablet Commonly known as: MAG-OX Take 2 tablets (800 mg total) by mouth 2 (two) times daily.   Multi Complete/Iron Tabs Take 1 tablet by mouth daily with breakfast.   ondansetron 4 MG tablet Commonly known as:  ZOFRAN Take 1 tablet (4 mg total) by mouth every 6 (six) hours as needed for nausea.   PHENobarbital 32.4 MG tablet Commonly known as: LUMINAL Take 64.8 mg by mouth daily with breakfast.   phenytoin 100 MG ER capsule Commonly known as: DILANTIN Take 200 mg by mouth daily with breakfast.   senna-docusate 8.6-50 MG tablet Commonly known as: Senokot-S Take 1 tablet by mouth at bedtime as needed for mild constipation.   simvastatin 20 MG tablet Commonly known as: ZOCOR Take 20 mg by mouth at bedtime.   Systane Ultra PF 0.4-0.3 % Soln Generic drug: Polyethyl Glyc-Propyl Glyc PF Place 1 drop into both eyes in the morning and at bedtime.   Vitamin-B Complex Tabs Take 1 tablet by mouth daily.        Review of Systems  Constitutional:  Positive for activity change and appetite change.  HENT: Negative.    Respiratory: Negative.    Cardiovascular: Negative.   Gastrointestinal:  Positive for diarrhea and nausea.  Musculoskeletal:  Positive for gait problem.  Skin: Negative.   Neurological:  Positive for weakness. Negative for dizziness and light-headedness.  Psychiatric/Behavioral: Negative.     Vitals:   04/17/21 0929  BP: (!) 146/64  Pulse: 84  Resp: 19  Temp: 99.7 F (37.6 C)  SpO2: 93%  Weight: 155 lb 9.6 oz (70.6 kg)  Height: 5\' 4"  (1.626 m)   Body mass index is 26.71 kg/m. Physical Exam Vitals reviewed.  Constitutional:      Appearance: Normal appearance.  HENT:     Head: Normocephalic.     Nose: Nose normal.     Mouth/Throat:     Mouth: Mucous membranes are dry.     Pharynx: Oropharynx is clear.   Cardiovascular:     Rate and Rhythm: Normal rate and regular rhythm.     Pulses: Normal pulses.  Pulmonary:     Effort: Pulmonary effort is normal. No respiratory distress.     Breath sounds: Normal breath sounds. No wheezing.  Abdominal:     General: Abdomen is flat. Bowel sounds are normal.     Palpations: Abdomen is soft.  Musculoskeletal:        General: No swelling.     Cervical back: Neck supple.  Skin:    General: Skin is warm and dry.  Neurological:     General: No focal deficit present.     Mental Status: She is alert and oriented to person, place, and time.  Psychiatric:        Mood and Affect: Mood normal.        Thought Content: Thought content normal.    Labs reviewed: Basic Metabolic Panel: Recent Labs    07/22/20 0804 07/22/20 1208 07/23/20 0442 07/24/20 0414 07/25/20 0203 08/03/20 0000 04/12/21 0340 04/13/21 0341 04/14/21 0338  NA 121*   < > 127*   < > 128*   < > 127* 131* 131*  K 3.7   < > 3.1*   < > 3.5   < > 3.8 4.1 3.8  CL 90*   < > 95*   < > 99   < > 95* 99 98  CO2 23   < > 23   < > 20*   < > 26 26 25   GLUCOSE 124*   < > 116*   < > 114*   < > 103* 107* 111*  BUN 7*   < > 8   < > 23   < > 21  25* 28*  CREATININE 0.71   < > 0.85   < > 1.09*   < > 0.74 0.80 0.87  CALCIUM 7.6*   < > 7.6*   < > 7.8*   < > 8.2* 8.4* 8.4*  MG 1.6*  --  1.4*  --  1.7   < > 1.6* 1.8 1.4*  PHOS 2.3*  --  2.6  --  3.4  --   --   --   --    < > = values in this interval not displayed.   Liver Function Tests: Recent Labs    07/19/20 0000 07/21/20 1745 04/10/21 1027  AST 30 31 20   ALT 14 16 11   ALKPHOS  --  56 68  BILITOT  --  1.0 0.4  PROT  --  6.6 6.9  ALBUMIN 3.6 3.5 3.8   Recent Labs    04/10/21 1027  LIPASE 61*   No results for input(s): AMMONIA in the last 8760 hours. CBC: Recent Labs    04/12/21 0340 04/13/21 0341 04/14/21 0338  WBC 6.8 8.1 7.3  NEUTROABS 4.7 5.9 5.0  HGB 8.8* 8.7* 8.6*  HCT 25.7* 25.7* 24.8*  MCV 98.5 98.8 97.3  PLT 176 188  190   Cardiac Enzymes: No results for input(s): CKTOTAL, CKMB, CKMBINDEX, TROPONINI in the last 8760 hours. BNP: Invalid input(s): POCBNP No results found for: HGBA1C Lab Results  Component Value Date   TSH 1.685 07/21/2020   Lab Results  Component Value Date   VITAMINB12 1,311 (H) 07/24/2020   Lab Results  Component Value Date   FOLATE 13.5 07/24/2020   Lab Results  Component Value Date   IRON 9 (L) 07/24/2020   TIBC 122 (L) 07/24/2020   FERRITIN 236 07/24/2020    Imaging and Procedures obtained prior to SNF admission: No results found.  Assessment/Plan Hyponatremia Due to dehydration due to her Diarrhea Acute diarrhea Check stool for C Diff Has been negative in past Refer to GI 2 episodes with severe Diarrhea and Hyponatremia  Iron deficiency anemia Hgb also low Iron stopped this afdmission Check Stool for Occult Blood  Partial symptomatic epilepsy with complex partial seizures, not intractable, without status epilepticus ([place]) On Phenobarb abd Phenytoin  Essential hypertension Continue on Coreg Weakness Started working with therapy At baseline walks with her walker Mixed hyperlipidemia On Statin Spinal stenosis of lumbar region, unspecified whether neurogenic claudication present Was suppose to get MRI of Back But now says pain is better Will need follow up as Out patient  Addendum BMP today showed sodium if 134 HGB was 9.4 Family/ staff Communication:   Labs/tests ordered:

## 2021-04-18 DIAGNOSIS — E871 Hypo-osmolality and hyponatremia: Secondary | ICD-10-CM | POA: Diagnosis not present

## 2021-04-18 DIAGNOSIS — R197 Diarrhea, unspecified: Secondary | ICD-10-CM | POA: Diagnosis not present

## 2021-04-18 DIAGNOSIS — R278 Other lack of coordination: Secondary | ICD-10-CM | POA: Diagnosis not present

## 2021-04-18 DIAGNOSIS — R2689 Other abnormalities of gait and mobility: Secondary | ICD-10-CM | POA: Diagnosis not present

## 2021-04-18 DIAGNOSIS — M4807 Spinal stenosis, lumbosacral region: Secondary | ICD-10-CM | POA: Diagnosis not present

## 2021-04-19 DIAGNOSIS — M4807 Spinal stenosis, lumbosacral region: Secondary | ICD-10-CM | POA: Diagnosis not present

## 2021-04-19 DIAGNOSIS — M6281 Muscle weakness (generalized): Secondary | ICD-10-CM | POA: Diagnosis not present

## 2021-04-19 DIAGNOSIS — R2689 Other abnormalities of gait and mobility: Secondary | ICD-10-CM | POA: Diagnosis not present

## 2021-04-19 DIAGNOSIS — E871 Hypo-osmolality and hyponatremia: Secondary | ICD-10-CM | POA: Diagnosis not present

## 2021-04-19 DIAGNOSIS — R278 Other lack of coordination: Secondary | ICD-10-CM | POA: Diagnosis not present

## 2021-04-20 ENCOUNTER — Encounter: Payer: Self-pay | Admitting: Adult Health

## 2021-04-20 ENCOUNTER — Non-Acute Institutional Stay (SKILLED_NURSING_FACILITY): Payer: Medicare Other | Admitting: Adult Health

## 2021-04-20 DIAGNOSIS — E871 Hypo-osmolality and hyponatremia: Secondary | ICD-10-CM | POA: Diagnosis not present

## 2021-04-20 DIAGNOSIS — R278 Other lack of coordination: Secondary | ICD-10-CM | POA: Diagnosis not present

## 2021-04-20 DIAGNOSIS — R195 Other fecal abnormalities: Secondary | ICD-10-CM | POA: Diagnosis not present

## 2021-04-20 DIAGNOSIS — I10 Essential (primary) hypertension: Secondary | ICD-10-CM

## 2021-04-20 DIAGNOSIS — R2689 Other abnormalities of gait and mobility: Secondary | ICD-10-CM | POA: Diagnosis not present

## 2021-04-20 DIAGNOSIS — R197 Diarrhea, unspecified: Secondary | ICD-10-CM | POA: Diagnosis not present

## 2021-04-20 DIAGNOSIS — D508 Other iron deficiency anemias: Secondary | ICD-10-CM

## 2021-04-20 DIAGNOSIS — M4807 Spinal stenosis, lumbosacral region: Secondary | ICD-10-CM | POA: Diagnosis not present

## 2021-04-20 DIAGNOSIS — R509 Fever, unspecified: Secondary | ICD-10-CM | POA: Diagnosis not present

## 2021-04-20 DIAGNOSIS — M6281 Muscle weakness (generalized): Secondary | ICD-10-CM | POA: Diagnosis not present

## 2021-04-20 LAB — MAGNESIUM: Magnesium: 1.7

## 2021-04-20 NOTE — Progress Notes (Signed)
Location:  [place] Room Number: [place] of Service:  SNF [phone]) Provider:  Royal Hawthorn NP   Burnard Bunting, MD  Patient Care Team: Burnard Bunting, MD as PCP - General (Internal Medicine)  Extended Emergency Contact Information Primary Emergency Contact: [place] Mobile Phone: [phone] Relation: Daughter Secondary Emergency Contact: JAVONA, BERGEVIN Mobile Phone: [phone] Relation: Son  Code Status: DNR Goals of care: Advanced Directive information Advanced Directives 04/17/2021  Does Patient Have a Medical Advance Directive? Yes  Type of Paramedic of [place];Out of facility DNR (pink MOST or yellow form)  Does patient want to make changes to medical advance directive? No - Patient declined  Copy of [place] in Chart? No - copy requested     Chief Complaint  Patient presents with   Acute Visit   Follow up low grade fever    HPI:  Pt is a 85 y.o. female seen today for an acute visit follow up of low grade fever temp today 99.4  Patient recently seen by Dr. Lyndel Safe 04/17/21 for admission to SNF following hospitalization (10/31-11/04). Patient was hospitalized for treatment of hyponatremia due to dehydration from diarrhea. Treated with NS and sodium improved.   Since arriving at [facility] was consulted for intermittent diarrhea, C Diff ordered x 2. First sample neg 11/08  Hemoccult for stools x 2 positive. No abd pain or obvious bleeding.  Rapid covid negative   Labs from 11/07 include RBC 2.73, Hgb 9.4, chronically anemic with MCV 98.    Staff reports that patient continue to have ongoing loose stools.Today she reports that her appetite has improved and that has not had any BM with loose stool this morning.  She denies abdominal pain, nausea and vomiting  Fever today 99.4, patient denies any dysuria.  Feels fatigued. Had a cough a few days ago which improved.    Pseudogout- has history uric acid level 6.30.Has great toe pain which resolved.  Worked with OT today.    Past Medical History:  Diagnosis Date   Abnormal cardiovascular stress test    Carotid arterial disease (HCC)    MODERATE RIGHT   Cerebral AV malformation    DDD (degenerative disc disease)    SEVERE WITH SPINAL STENOSIS   Hypercholesterolemia    Hypertension    MVP (mitral valve prolapse)    MILD   Numbness    LEFT FOOT AND ANKLE   Osteopenia    Pseudo-gout    Past Surgical History:  Procedure Laterality Date   APPENDECTOMY     BREAST MASS EXCISION     CARPAL TUNNEL RELEASE     REMOVAL OF PARATHYROID GLAND     STRESS NUCLEAR STUDY     ABNORMAL   TONSILLECTOMY      Allergies  Allergen Reactions   Azithromycin Other (See Comments)    Reaction not recalled   Penicillins Hives and Other (See Comments)    Bad reaction as a child after a shot of Penicillin   Pravastatin Other (See Comments)    Reaction not recalled   Sulfa Drugs Cross Reactors Other (See Comments)    Reaction not recalled   Naproxen Sodium Rash    Outpatient Encounter Medications as of 04/20/2021  Medication Sig   acetaminophen (TYLENOL) 500 MG tablet Take 500 mg by mouth every 6 (six) hours as needed for mild pain or headache.   B Complex Vitamins (VITAMIN-B COMPLEX) TABS Take 1 tablet by mouth daily.  carvedilol (COREG) 25 MG tablet Take 25 mg by mouth 2 (two) times daily with a meal.   fluticasone (FLONASE) 50 MCG/ACT nasal spray Place 1-2 sprays into both nostrils in the morning.   magnesium oxide (MAG-OX) 400 (240 Mg) MG tablet Take 2 tablets (800 mg total) by mouth 2 (two) times daily.   Multiple Vitamins-Minerals (MULTI COMPLETE/IRON) TABS Take 1 tablet by mouth daily with breakfast.   ondansetron (ZOFRAN) 4 MG tablet Take 1 tablet (4 mg total) by mouth every 6 (six) hours as needed for nausea.   PHENobarbital (LUMINAL) 32.4 MG tablet Take 64.8 mg by mouth daily with breakfast.    phenytoin (DILANTIN) 100 MG ER capsule Take 200 mg by mouth daily with breakfast.   senna-docusate (SENOKOT-S) 8.6-50 MG tablet Take 1 tablet by mouth at bedtime as needed for mild constipation.   simvastatin (ZOCOR) 20 MG tablet Take 20 mg by mouth at bedtime.   SYSTANE ULTRA PF 0.4-0.3 % SOLN Place 1 drop into both eyes in the morning and at bedtime.   No facility-administered encounter medications on file as of 04/20/2021.    Review of Systems  Constitutional:  Positive for activity change, fatigue and fever. Negative for appetite change and chills.  HENT:  Negative for congestion.   Respiratory:  Negative for cough, shortness of breath and wheezing.   Cardiovascular:  Positive for leg swelling. Negative for chest pain and palpitations.  Gastrointestinal:  Positive for diarrhea. Negative for abdominal pain, nausea and vomiting.  Genitourinary:  Negative for difficulty urinating.  Neurological:  Positive for weakness. Negative for dizziness.   Immunization History  Administered Date(s) Administered   Influenza Split 03/28/2009, 03/17/2010, 03/26/2011, 04/01/2012, 03/05/2013   Influenza, Quadrivalent, Recombinant, Inj, Pf 03/08/2018, 02/13/2019, 02/24/2020   Influenza,inj,Quad PF,6+ Mos 02/25/2014, 03/05/2015   Influenza-Unspecified 02/09/2014, 02/25/2016, 04/06/2017   Moderna Sars-Covid-2 Vaccination 07/17/2019, 08/14/2019, 02/24/2020, 04/26/2020   Pneumococcal Conjugate-13 11/26/2014   Pneumococcal-Unspecified 06/23/2003, 11/10/2012   Td (Adult), 2 Lf Tetanus Toxid, Preservative Free 06/11/2000, 11/06/2011   Zoster Recombinat (Shingrix) 01/09/2019   Zoster, Live 01/15/2006, 01/15/2019   Pertinent  Health Maintenance Due  Topic Date Due   DEXA SCAN  Never done   INFLUENZA VACCINE  01/09/2021   Fall Risk 04/12/2021 04/12/2021 04/13/2021 04/13/2021 04/14/2021  Patient Fall Risk Level High fall risk High fall risk High fall risk High fall risk High fall risk   Functional Status  Survey:    Vitals:   04/20/21 1055  BP: (!) 141/60  Pulse: 90  Temp: 99.4 F (37.4 C)  SpO2: 93%   There is no height or weight on file to calculate BMI. Physical Exam HENT:     Head: Normocephalic and atraumatic.     Mouth/Throat:     Mouth: Mucous membranes are moist.     Pharynx: Oropharynx is clear.  Eyes:     Pupils: Pupils are equal, round, and reactive to light.  Cardiovascular:     Rate and Rhythm: Normal rate and regular rhythm.  Pulmonary:     Effort: Pulmonary effort is normal.     Breath sounds: Normal breath sounds.  Musculoskeletal:        General: No tenderness.     Cervical back: Normal range of motion.     Right lower leg: Edema present.     Left lower leg: Edema present.     Comments: Wears compression socks  Skin:    General: Skin is warm.  Neurological:     Mental Status: She  is alert.     Motor: Weakness present.     Gait: Gait abnormal.  Psychiatric:        Mood and Affect: Mood normal.    Labs reviewed: Recent Labs    07/22/20 0804 07/22/20 1208 07/23/20 0442 07/24/20 0414 07/25/20 0203 08/03/20 0000 04/12/21 0340 04/13/21 0341 04/14/21 0338 04/17/21 0000  NA 121*   < > 127*   < > 128*   < > 127* 131* 131* 134*  K 3.7   < > 3.1*   < > 3.5   < > 3.8 4.1 3.8 4.0  CL 90*   < > 95*   < > 99   < > 95* 99 98 96*  CO2 23   < > 23   < > 20*   < > 26 26 25  26*  GLUCOSE 124*   < > 116*   < > 114*   < > 103* 107* 111*  --   BUN 7*   < > 8   < > 23   < > 21 25* 28* 24*  CREATININE 0.71   < > 0.85   < > 1.09*   < > 0.74 0.80 0.87 0.9  CALCIUM 7.6*   < > 7.6*   < > 7.8*   < > 8.2* 8.4* 8.4* 8.9  MG 1.6*  --  1.4*  --  1.7   < > 1.6* 1.8 1.4* 1.7  PHOS 2.3*  --  2.6  --  3.4  --   --   --   --   --    < > = values in this interval not displayed.   Recent Labs    07/19/20 0000 07/21/20 1745 04/10/21 1027  AST 30 31 20   ALT 14 16 11   ALKPHOS  --  56 68  BILITOT  --  1.0 0.4  PROT  --  6.6 6.9  ALBUMIN 3.6 3.5 3.8   Recent Labs     04/12/21 0340 04/13/21 0341 04/14/21 0338 04/17/21 0000  WBC 6.8 8.1 7.3 4.9  NEUTROABS 4.7 5.9 5.0  --   HGB 8.8* 8.7* 8.6* 9.4*  HCT 25.7* 25.7* 24.8* 27*  MCV 98.5 98.8 97.3  --   PLT 176 188 190 220   Lab Results  Component Value Date   TSH 1.685 07/21/2020   No results found for: HGBA1C No results found for: CHOL, HDL, LDLCALC, LDLDIRECT, TRIG, CHOLHDL  Significant Diagnostic Results in last 30 days:  No results found.  Assessment/Plan 1. Low grade fever Temp today 99.4 which has continued for several days with fatigue Rapid covid neg x 2 Stat CXR ordered  UA with C&S   2. Acute diarrhea and heme pos stool  No BM with loose stools reported today.  Initial C Diff negative, second test pending.  Referral to GI in place due to recurrent diarrhea and heme pos stool.   3. Hyponatremia Due to dehydration from diarrhea. Most recent Na 134 Continue to increase fluid intake. Repeat BMP already ordered   4. Other iron deficiency anemia Hgb low at 9.4, order in place for staff to check stool for occult blood Continue multivitamin with iron, B12  Iron panel, B 12, folate and repeat CBC added to next lab draw  5. Essential hypertension BP today 141/60 Continue coreg 25 mg BID   6. Hyperuricemia No current joint complaints, will recheck in two weeks   Family/ staff Communication: With patient, left message with daughter  Ms. Rod Holler  Labs/tests ordered: U/A C&S ordered, CXR Folate, iron panel, B12, CBC added to next lab draw  Seen and examined and agree with the above plan of care.

## 2021-04-21 ENCOUNTER — Encounter: Payer: Self-pay | Admitting: Gastroenterology

## 2021-04-21 DIAGNOSIS — I10 Essential (primary) hypertension: Secondary | ICD-10-CM | POA: Diagnosis not present

## 2021-04-21 DIAGNOSIS — D509 Iron deficiency anemia, unspecified: Secondary | ICD-10-CM | POA: Diagnosis not present

## 2021-04-21 DIAGNOSIS — R509 Fever, unspecified: Secondary | ICD-10-CM | POA: Diagnosis not present

## 2021-04-21 DIAGNOSIS — R5381 Other malaise: Secondary | ICD-10-CM | POA: Diagnosis not present

## 2021-04-21 DIAGNOSIS — M6281 Muscle weakness (generalized): Secondary | ICD-10-CM | POA: Diagnosis not present

## 2021-04-21 DIAGNOSIS — E871 Hypo-osmolality and hyponatremia: Secondary | ICD-10-CM | POA: Diagnosis not present

## 2021-04-21 DIAGNOSIS — M4807 Spinal stenosis, lumbosacral region: Secondary | ICD-10-CM | POA: Diagnosis not present

## 2021-04-21 DIAGNOSIS — D529 Folate deficiency anemia, unspecified: Secondary | ICD-10-CM | POA: Diagnosis not present

## 2021-04-21 DIAGNOSIS — R197 Diarrhea, unspecified: Secondary | ICD-10-CM | POA: Diagnosis not present

## 2021-04-22 DIAGNOSIS — M25511 Pain in right shoulder: Secondary | ICD-10-CM | POA: Diagnosis not present

## 2021-04-22 DIAGNOSIS — M79601 Pain in right arm: Secondary | ICD-10-CM | POA: Diagnosis not present

## 2021-04-23 DIAGNOSIS — E871 Hypo-osmolality and hyponatremia: Secondary | ICD-10-CM | POA: Diagnosis not present

## 2021-04-24 ENCOUNTER — Encounter: Payer: Self-pay | Admitting: Internal Medicine

## 2021-04-24 ENCOUNTER — Other Ambulatory Visit: Payer: Self-pay | Admitting: Internal Medicine

## 2021-04-24 ENCOUNTER — Non-Acute Institutional Stay (SKILLED_NURSING_FACILITY): Payer: Medicare Other | Admitting: Internal Medicine

## 2021-04-24 DIAGNOSIS — R531 Weakness: Secondary | ICD-10-CM | POA: Diagnosis not present

## 2021-04-24 DIAGNOSIS — E782 Mixed hyperlipidemia: Secondary | ICD-10-CM

## 2021-04-24 DIAGNOSIS — N3000 Acute cystitis without hematuria: Secondary | ICD-10-CM | POA: Diagnosis not present

## 2021-04-24 DIAGNOSIS — G40209 Localization-related (focal) (partial) symptomatic epilepsy and epileptic syndromes with complex partial seizures, not intractable, without status epilepticus: Secondary | ICD-10-CM | POA: Diagnosis not present

## 2021-04-24 DIAGNOSIS — S42351G Displaced comminuted fracture of shaft of humerus, right arm, subsequent encounter for fracture with delayed healing: Secondary | ICD-10-CM | POA: Diagnosis not present

## 2021-04-24 DIAGNOSIS — R195 Other fecal abnormalities: Secondary | ICD-10-CM | POA: Diagnosis not present

## 2021-04-24 DIAGNOSIS — I10 Essential (primary) hypertension: Secondary | ICD-10-CM

## 2021-04-24 DIAGNOSIS — E871 Hypo-osmolality and hyponatremia: Secondary | ICD-10-CM

## 2021-04-24 DIAGNOSIS — R197 Diarrhea, unspecified: Secondary | ICD-10-CM

## 2021-04-24 DIAGNOSIS — R278 Other lack of coordination: Secondary | ICD-10-CM | POA: Diagnosis not present

## 2021-04-24 DIAGNOSIS — R110 Nausea: Secondary | ICD-10-CM

## 2021-04-24 DIAGNOSIS — R2689 Other abnormalities of gait and mobility: Secondary | ICD-10-CM | POA: Diagnosis not present

## 2021-04-24 DIAGNOSIS — D508 Other iron deficiency anemias: Secondary | ICD-10-CM | POA: Diagnosis not present

## 2021-04-24 DIAGNOSIS — M4807 Spinal stenosis, lumbosacral region: Secondary | ICD-10-CM | POA: Diagnosis not present

## 2021-04-24 DIAGNOSIS — R509 Fever, unspecified: Secondary | ICD-10-CM

## 2021-04-24 NOTE — Progress Notes (Signed)
Location:   [place] Room Number: 333 LKTGY of Service:  SNF [phone]) Provider:  Veleta Miners MD  Burnard Bunting, MD  Patient Care Team: Burnard Bunting, MD as PCP - General (Internal Medicine)  Extended Emergency Contact Information Primary Emergency Contact: [place] Mobile Phone: [phone] Relation: Daughter Secondary Emergency Contact: GWENDOLYN, MCLEES Mobile Phone: [phone] Relation: Son  Code Status:  DNR Goals of care: Advanced Directive information Advanced Directives 04/17/2021  Does Patient Have a Medical Advance Directive? Yes  Type of Paramedic of [place];Out of facility DNR (pink MOST or yellow form)  Does patient want to make changes to medical advance directive? No - Patient declined  Copy of [place] in Chart? No - copy requested     Chief Complaint  Patient presents with   Acute Visit    Fever and arm fracture    HPI:  Pt is a 85 y.o. female seen today for an acute visit for Low Grade Temp, Nausea Poor Appetite    Admitted in the hospital from 10/31-11/04 for Hyonatremia due to Dehydration worsen by Diarrhea   Patient has a history of hypertension, spinal stenosis with back pain, hyperlipidemia, history of complex partial epilepsy, hypokalemia   Patient was admitted in the hospital after she presented with weakness.  Per patient she was not feeling well for 3 to 4 days before going to ED she was having loose stool for 3 to 4 days.  In ED her sodium was 122 when she was admitted Patient was admitted in the 2/22 for similar episode of diarrhea and hyponatremia She says she got better after that but it took some time  Initially she was doing well in [place] with therapy  But then noticed to start feeling weak again. Also Had low grade fever of 99 Urine came back positive for UTI with E Coli though UA was negative for Leucocytes Started oN Levaquin for 5  days by on call But patient continue to feel poor, Not eating C/o Abdominal Discomfort No diarrhea  Her stool is negative for C Diff but Positive for Occult blood Has lost 5 lbs  Her Chest Xray was negative but it showed Right Humerus shaft Fracture Age Indeterminate. She was told to go to ED she refused Per her She has had Fractures few years ago saw Ortho and no recent pain or any other issue  Past Medical History:  Diagnosis Date   Abnormal cardiovascular stress test    Carotid arterial disease (HCC)    MODERATE RIGHT   Cerebral AV malformation    DDD (degenerative disc disease)    SEVERE WITH SPINAL STENOSIS   Hypercholesterolemia    Hypertension    MVP (mitral valve prolapse)    MILD   Numbness    LEFT FOOT AND ANKLE   Osteopenia    Pseudo-gout    Past Surgical History:  Procedure Laterality Date   APPENDECTOMY     BREAST MASS EXCISION     CARPAL TUNNEL RELEASE     REMOVAL OF PARATHYROID GLAND     STRESS NUCLEAR STUDY     ABNORMAL   TONSILLECTOMY      Allergies  Allergen Reactions   Azithromycin Other (See Comments)    Reaction not recalled   Penicillins Hives and Other (See Comments)    Bad reaction as a child after a shot of Penicillin   Pravastatin Other (See Comments)    Reaction not recalled  Sulfa Drugs Cross Reactors Other (See Comments)    Reaction not recalled   Naproxen Sodium Rash    Allergies as of 04/24/2021       Reactions   Azithromycin Other (See Comments)   Reaction not recalled   Penicillins Hives, Other (See Comments)   Bad reaction as a child after a shot of Penicillin   Pravastatin Other (See Comments)   Reaction not recalled   Sulfa Drugs Cross Reactors Other (See Comments)   Reaction not recalled   Naproxen Sodium Rash        Medication List        Accurate as of April 24, 2021  9:46 AM. If you have any questions, ask your nurse or doctor.          acetaminophen 500 MG tablet Commonly known as: TYLENOL Take  500 mg by mouth every 6 (six) hours as needed for mild pain or headache.   carvedilol 25 MG tablet Commonly known as: COREG Take 25 mg by mouth 2 (two) times daily with a meal.   fluticasone 50 MCG/ACT nasal spray Commonly known as: FLONASE Place 1-2 sprays into both nostrils in the morning.   levofloxacin 750 MG tablet Commonly known as: LEVAQUIN Take 750 mg by mouth daily. For 5 days   loperamide 2 MG tablet Commonly known as: IMODIUM A-D Take 2 mg by mouth as needed for diarrhea or loose stools.   magnesium oxide 400 (240 Mg) MG tablet Commonly known as: MAG-OX Take 2 tablets (800 mg total) by mouth 2 (two) times daily.   Multi Complete/Iron Tabs Take 1 tablet by mouth daily with breakfast.   ondansetron 4 MG tablet Commonly known as: ZOFRAN Take 1 tablet (4 mg total) by mouth every 6 (six) hours as needed for nausea.   PHENobarbital 32.4 MG tablet Commonly known as: LUMINAL Take 64.8 mg by mouth daily with breakfast.   phenytoin 100 MG ER capsule Commonly known as: DILANTIN Take 200 mg by mouth daily with breakfast.   senna-docusate 8.6-50 MG tablet Commonly known as: Senokot-S Take 1 tablet by mouth at bedtime as needed for mild constipation.   simvastatin 20 MG tablet Commonly known as: ZOCOR Take 20 mg by mouth at bedtime.   sodium chloride 0.65 % nasal spray Commonly known as: OCEAN Place 1 spray into the nose as needed for congestion.   Systane Ultra PF 0.4-0.3 % Soln Generic drug: Polyethyl Glyc-Propyl Glyc PF Place 1 drop into both eyes in the morning and at bedtime.   Vitamin-B Complex Tabs Take 1 tablet by mouth daily.        Review of Systems  Constitutional:  Positive for activity change, appetite change and unexpected weight change.  HENT: Negative.    Respiratory: Negative.    Cardiovascular: Negative.   Gastrointestinal:  Positive for abdominal distention, blood in stool and nausea.  Genitourinary: Negative.   Musculoskeletal:  Negative.   Skin: Negative.   Neurological:  Positive for weakness. Negative for dizziness.  Psychiatric/Behavioral: Negative.     Immunization History  Administered Date(s) Administered   Influenza Split 03/28/2009, 03/17/2010, 03/26/2011, 04/01/2012, 03/05/2013   Influenza, Quadrivalent, Recombinant, Inj, Pf 03/08/2018, 02/13/2019, 02/24/2020   Influenza,inj,Quad PF,6+ Mos 02/25/2014, 03/05/2015   Influenza-Unspecified 02/09/2014, 02/25/2016, 04/06/2017   Moderna Sars-Covid-2 Vaccination 07/17/2019, 08/14/2019, 02/24/2020, 04/26/2020   Pneumococcal Conjugate-13 11/26/2014   Pneumococcal-Unspecified 06/23/2003, 11/10/2012   Td (Adult), 2 Lf Tetanus Toxid, Preservative Free 06/11/2000, 11/06/2011   Zoster Recombinat (Shingrix) 01/09/2019   Zoster,  Live 01/15/2006, 01/15/2019   Pertinent  Health Maintenance Due  Topic Date Due   DEXA SCAN  Never done   INFLUENZA VACCINE  01/09/2021   Fall Risk 04/12/2021 04/12/2021 04/13/2021 04/13/2021 04/14/2021  Patient Fall Risk Level High fall risk High fall risk High fall risk High fall risk High fall risk   Functional Status Survey:    Vitals:   04/24/21 0937  BP: (!) 154/66  Pulse: 77  Resp: 18  Temp: 99.3 F (37.4 C)  SpO2: 98%  Weight: 150 lb 6.4 oz (68.2 kg)  Height: 5\' 4"  (1.626 m)   Body mass index is 25.82 kg/m. Physical Exam Vitals reviewed.  Constitutional:      Appearance: Normal appearance.  HENT:     Head: Normocephalic.     Nose: Nose normal.     Mouth/Throat:     Mouth: Mucous membranes are moist.     Pharynx: Oropharynx is clear.  Cardiovascular:     Rate and Rhythm: Normal rate and regular rhythm.     Pulses: Normal pulses.     Heart sounds: Normal heart sounds.  Pulmonary:     Effort: Pulmonary effort is normal. No respiratory distress.     Breath sounds: Normal breath sounds.  Abdominal:     General: There is distension.     Palpations: Abdomen is soft.     Tenderness: There is no abdominal tenderness.  There is no guarding.     Comments: BS increased  Musculoskeletal:        General: No swelling.     Cervical back: Neck supple.  Skin:    General: Skin is warm and dry.  Neurological:     General: No focal deficit present.     Mental Status: She is alert.  Psychiatric:        Mood and Affect: Mood normal.        Thought Content: Thought content normal.    Labs reviewed: Recent Labs    07/22/20 0804 07/22/20 1208 07/23/20 0442 07/24/20 0414 07/25/20 0203 08/03/20 0000 04/12/21 0340 04/13/21 0341 04/14/21 0338 04/17/21 0000  NA 121*   < > 127*   < > 128*   < > 127* 131* 131* 134*  K 3.7   < > 3.1*   < > 3.5   < > 3.8 4.1 3.8 4.0  CL 90*   < > 95*   < > 99   < > 95* 99 98 96*  CO2 23   < > 23   < > 20*   < > 26 26 25  26*  GLUCOSE 124*   < > 116*   < > 114*   < > 103* 107* 111*  --   BUN 7*   < > 8   < > 23   < > 21 25* 28* 24*  CREATININE 0.71   < > 0.85   < > 1.09*   < > 0.74 0.80 0.87 0.9  CALCIUM 7.6*   < > 7.6*   < > 7.8*   < > 8.2* 8.4* 8.4* 8.9  MG 1.6*  --  1.4*  --  1.7   < > 1.6* 1.8 1.4* 1.7  PHOS 2.3*  --  2.6  --  3.4  --   --   --   --   --    < > = values in this interval not displayed.   Recent Labs    07/19/20 0000 07/21/20 1745 04/10/21 1027  AST 30 31 20   ALT 14 16 11   ALKPHOS  --  56 68  BILITOT  --  1.0 0.4  PROT  --  6.6 6.9  ALBUMIN 3.6 3.5 3.8   Recent Labs    04/12/21 0340 04/13/21 0341 04/14/21 0338 04/17/21 0000  WBC 6.8 8.1 7.3 4.9  NEUTROABS 4.7 5.9 5.0  --   HGB 8.8* 8.7* 8.6* 9.4*  HCT 25.7* 25.7* 24.8* 27*  MCV 98.5 98.8 97.3  --   PLT 176 188 190 220   Lab Results  Component Value Date   TSH 1.685 07/21/2020   No results found for: HGBA1C No results found for: CHOL, HDL, LDLCALC, LDLDIRECT, TRIG, CHOLHDL  Significant Diagnostic Results in last 30 days:  No results found.  Assessment/Plan Low grade fever with Abdominal Discomfort and Diarrhea Check CT scan of abdomen  has appointment with GI in 2 weeks  Acute  cystitis without hematuria Possible UTI though her Urine was negative for Leucocytes and Nitrites Was started on Levaquin  Acute diarrhea Right now it is resolved as she is not eating much C Diff negative. But was occult Positive Hyponatremia Sodium stable so far Other iron deficiency anemia Hgb low Cannot start Iron due to Nausea  Essential hypertension Continue on Coreg Heme positive stool GI Appointment Weakness  Partial symptomatic epilepsy with complex partial seizures, not intractable, without status epilepticus ([place]) On Pheynytoin and Phenobarb Mixed hyperlipidemia On statin Closed displaced comminuted fracture of shaft of right humerus with delayed healing Patient Is not having any pain and says she has seen Ortho before for this Does not want to see ortho right now   Family/ staff Communication:   Labs/tests ordered:   CMP,CBC in Am

## 2021-04-25 DIAGNOSIS — E871 Hypo-osmolality and hyponatremia: Secondary | ICD-10-CM | POA: Diagnosis not present

## 2021-04-25 DIAGNOSIS — R110 Nausea: Secondary | ICD-10-CM | POA: Diagnosis not present

## 2021-04-25 DIAGNOSIS — M6281 Muscle weakness (generalized): Secondary | ICD-10-CM | POA: Diagnosis not present

## 2021-04-25 DIAGNOSIS — R278 Other lack of coordination: Secondary | ICD-10-CM | POA: Diagnosis not present

## 2021-04-25 DIAGNOSIS — R2689 Other abnormalities of gait and mobility: Secondary | ICD-10-CM | POA: Diagnosis not present

## 2021-04-25 DIAGNOSIS — M4807 Spinal stenosis, lumbosacral region: Secondary | ICD-10-CM | POA: Diagnosis not present

## 2021-04-25 LAB — CBC: RBC: 2.7 — AB (ref 3.87–5.11)

## 2021-04-25 LAB — BASIC METABOLIC PANEL
BUN: 26 — AB (ref 4–21)
CO2: 28 — AB (ref 13–22)
Chloride: 98 — AB (ref 99–108)
Creatinine: 1.1 (ref 0.5–1.1)
Potassium: 4.3 (ref 3.4–5.3)
Sodium: 136 — AB (ref 137–147)

## 2021-04-25 LAB — CBC AND DIFFERENTIAL
HCT: 26 — AB (ref 36–46)
Hemoglobin: 9.3 — AB (ref 12.0–16.0)
Platelets: 389 (ref 150–399)
WBC: 7.1

## 2021-04-25 LAB — HEPATIC FUNCTION PANEL
ALT: 61 — AB (ref 7–35)
AST: 74 — AB (ref 13–35)
Alkaline Phosphatase: 66 (ref 25–125)
Bilirubin, Total: 0.2

## 2021-04-25 LAB — COMPREHENSIVE METABOLIC PANEL
Albumin: 3.1 — AB (ref 3.5–5.0)
Calcium: 9.1 (ref 8.7–10.7)
GFR calc Af Amer: 53.34
GFR calc non Af Amer: 46.02
Globulin: 2.7

## 2021-04-26 ENCOUNTER — Other Ambulatory Visit: Payer: Self-pay | Admitting: Internal Medicine

## 2021-04-26 DIAGNOSIS — M4807 Spinal stenosis, lumbosacral region: Secondary | ICD-10-CM | POA: Diagnosis not present

## 2021-04-26 DIAGNOSIS — M6281 Muscle weakness (generalized): Secondary | ICD-10-CM | POA: Diagnosis not present

## 2021-04-26 DIAGNOSIS — R278 Other lack of coordination: Secondary | ICD-10-CM | POA: Diagnosis not present

## 2021-04-26 DIAGNOSIS — R2689 Other abnormalities of gait and mobility: Secondary | ICD-10-CM | POA: Diagnosis not present

## 2021-04-26 DIAGNOSIS — R140 Abdominal distension (gaseous): Secondary | ICD-10-CM

## 2021-04-26 DIAGNOSIS — E871 Hypo-osmolality and hyponatremia: Secondary | ICD-10-CM | POA: Diagnosis not present

## 2021-04-27 ENCOUNTER — Other Ambulatory Visit: Payer: Medicare Other

## 2021-04-27 ENCOUNTER — Ambulatory Visit (HOSPITAL_BASED_OUTPATIENT_CLINIC_OR_DEPARTMENT_OTHER)
Admission: RE | Admit: 2021-04-27 | Discharge: 2021-04-27 | Disposition: A | Discharge status: Home / Self Care | Payer: Medicare Other | Source: Ambulatory Visit | Attending: Internal Medicine | Admitting: Internal Medicine

## 2021-04-27 ENCOUNTER — Other Ambulatory Visit: Payer: Self-pay

## 2021-04-27 ENCOUNTER — Encounter (HOSPITAL_BASED_OUTPATIENT_CLINIC_OR_DEPARTMENT_OTHER): Payer: Self-pay

## 2021-04-27 DIAGNOSIS — M6281 Muscle weakness (generalized): Secondary | ICD-10-CM | POA: Diagnosis not present

## 2021-04-27 DIAGNOSIS — R109 Unspecified abdominal pain: Secondary | ICD-10-CM | POA: Diagnosis not present

## 2021-04-27 DIAGNOSIS — R140 Abdominal distension (gaseous): Secondary | ICD-10-CM | POA: Insufficient documentation

## 2021-04-27 DIAGNOSIS — K8020 Calculus of gallbladder without cholecystitis without obstruction: Secondary | ICD-10-CM | POA: Diagnosis not present

## 2021-04-27 DIAGNOSIS — R2689 Other abnormalities of gait and mobility: Secondary | ICD-10-CM | POA: Diagnosis not present

## 2021-04-27 DIAGNOSIS — M4807 Spinal stenosis, lumbosacral region: Secondary | ICD-10-CM | POA: Diagnosis not present

## 2021-04-27 DIAGNOSIS — K654 Sclerosing mesenteritis: Secondary | ICD-10-CM | POA: Diagnosis not present

## 2021-04-27 DIAGNOSIS — R278 Other lack of coordination: Secondary | ICD-10-CM | POA: Diagnosis not present

## 2021-04-27 DIAGNOSIS — R197 Diarrhea, unspecified: Secondary | ICD-10-CM | POA: Diagnosis not present

## 2021-04-27 DIAGNOSIS — E871 Hypo-osmolality and hyponatremia: Secondary | ICD-10-CM | POA: Diagnosis not present

## 2021-04-27 MED ORDER — IOHEXOL 350 MG/ML SOLN
75.0000 mL | Freq: Once | INTRAVENOUS | Status: AC | PRN
  Administered 2021-04-27: 16:00:00 100 mL via INTRAVENOUS

## 2021-04-28 DIAGNOSIS — E871 Hypo-osmolality and hyponatremia: Secondary | ICD-10-CM | POA: Diagnosis not present

## 2021-04-28 DIAGNOSIS — M4807 Spinal stenosis, lumbosacral region: Secondary | ICD-10-CM | POA: Diagnosis not present

## 2021-04-28 DIAGNOSIS — M6281 Muscle weakness (generalized): Secondary | ICD-10-CM | POA: Diagnosis not present

## 2021-04-28 DIAGNOSIS — R2689 Other abnormalities of gait and mobility: Secondary | ICD-10-CM | POA: Diagnosis not present

## 2021-04-28 DIAGNOSIS — R278 Other lack of coordination: Secondary | ICD-10-CM | POA: Diagnosis not present

## 2021-04-30 DIAGNOSIS — R2689 Other abnormalities of gait and mobility: Secondary | ICD-10-CM | POA: Diagnosis not present

## 2021-04-30 DIAGNOSIS — R278 Other lack of coordination: Secondary | ICD-10-CM | POA: Diagnosis not present

## 2021-04-30 DIAGNOSIS — E871 Hypo-osmolality and hyponatremia: Secondary | ICD-10-CM | POA: Diagnosis not present

## 2021-04-30 DIAGNOSIS — M4807 Spinal stenosis, lumbosacral region: Secondary | ICD-10-CM | POA: Diagnosis not present

## 2021-05-01 ENCOUNTER — Non-Acute Institutional Stay (SKILLED_NURSING_FACILITY): Payer: Medicare Other | Admitting: Internal Medicine

## 2021-05-01 ENCOUNTER — Encounter: Payer: Self-pay | Admitting: Internal Medicine

## 2021-05-01 DIAGNOSIS — E871 Hypo-osmolality and hyponatremia: Secondary | ICD-10-CM | POA: Diagnosis not present

## 2021-05-01 DIAGNOSIS — R278 Other lack of coordination: Secondary | ICD-10-CM | POA: Diagnosis not present

## 2021-05-01 DIAGNOSIS — G40209 Localization-related (focal) (partial) symptomatic epilepsy and epileptic syndromes with complex partial seizures, not intractable, without status epilepticus: Secondary | ICD-10-CM

## 2021-05-01 DIAGNOSIS — R2689 Other abnormalities of gait and mobility: Secondary | ICD-10-CM | POA: Diagnosis not present

## 2021-05-01 DIAGNOSIS — E782 Mixed hyperlipidemia: Secondary | ICD-10-CM

## 2021-05-01 DIAGNOSIS — R195 Other fecal abnormalities: Secondary | ICD-10-CM

## 2021-05-01 DIAGNOSIS — R140 Abdominal distension (gaseous): Secondary | ICD-10-CM

## 2021-05-01 DIAGNOSIS — R197 Diarrhea, unspecified: Secondary | ICD-10-CM | POA: Diagnosis not present

## 2021-05-01 DIAGNOSIS — I10 Essential (primary) hypertension: Secondary | ICD-10-CM

## 2021-05-01 DIAGNOSIS — S42351G Displaced comminuted fracture of shaft of humerus, right arm, subsequent encounter for fracture with delayed healing: Secondary | ICD-10-CM | POA: Diagnosis not present

## 2021-05-01 DIAGNOSIS — R509 Fever, unspecified: Secondary | ICD-10-CM

## 2021-05-01 DIAGNOSIS — M4807 Spinal stenosis, lumbosacral region: Secondary | ICD-10-CM | POA: Diagnosis not present

## 2021-05-01 DIAGNOSIS — D508 Other iron deficiency anemias: Secondary | ICD-10-CM | POA: Diagnosis not present

## 2021-05-01 DIAGNOSIS — M6281 Muscle weakness (generalized): Secondary | ICD-10-CM | POA: Diagnosis not present

## 2021-05-01 NOTE — Progress Notes (Signed)
Location:   [facility] Room Number: 810 FBPZW of Service:  SNF [phone]) Provider:  Veleta Miners MD   Burnard Bunting, MD  Patient Care Team: Burnard Bunting, MD as PCP - General (Internal Medicine)  Extended Emergency Contact Information Primary Emergency Contact: [place] Mobile Phone: [phone] Relation: Daughter Secondary Emergency Contact: ASHANTEE, DEUPREE Mobile Phone: [phone] Relation: Son  Code Status:  DNR Goals of care: Advanced Directive information Advanced Directives 04/17/2021  Does Patient Have a Medical Advance Directive? Yes  Type of Paramedic of [place];Out of facility DNR (pink MOST or yellow form)  Does patient want to make changes to medical advance directive? No - Patient declined  Copy of [place] in Chart? No - copy requested     Chief Complaint  Patient presents with   Acute Visit    HPI:  Pt is a 85 y.o. female seen today for an acute visit for Follow up of her CT scan and Diarrhea  Admitted in the hospital from 10/31-11/04 for Hyonatremia due to Dehydration worsen by Diarrhea   Patient has a history of hypertension, spinal stenosis with back pain, hyperlipidemia, history of complex partial epilepsy, hypokalemia   Patient was admitted in the hospital after she presented with weakness.  Per patient she was not feeling well for 3 to 4 days before going to ED she was having loose stool for 3 to 4 days.  In ED her sodium was 122 when she was admitted Patient has h/o admitted in the 2/22 for similar episode of diarrhea and hyponatremia She says she got better after that but it took some time   Initially she was doing well in [place] with therapy  But then noticed to start feeling weak again. Also Had low grade fever of 99 Urine came back positive for UTI with E Coli though UA was negative for Leucocytes Started oN Levaquin for 5 days   She also had  CT scan done for Abdominal discomfort and it did not show any acute changes except Gall stones with no inflamation  She is now feeling better. Walking with her walker Eating better . Still had few episodes of Diarrhea but says her bowels are much more formed. Denies any abdominal discomfort today   Her Chest Xray was negative but it showed Right Humerus shaft Fracture Age Indeterminate. She was told to go to ED she refused Per her She has had Fractures few years ago saw Ortho and no recent pain or any other issue  Past Medical History:  Diagnosis Date   Abnormal cardiovascular stress test    Carotid arterial disease (HCC)    MODERATE RIGHT   Cerebral AV malformation    DDD (degenerative disc disease)    SEVERE WITH SPINAL STENOSIS   Hypercholesterolemia    Hypertension    MVP (mitral valve prolapse)    MILD   Numbness    LEFT FOOT AND ANKLE   Osteopenia    Pseudo-gout    Past Surgical History:  Procedure Laterality Date   APPENDECTOMY     BREAST MASS EXCISION     CARPAL TUNNEL RELEASE     REMOVAL OF PARATHYROID GLAND     STRESS NUCLEAR STUDY     ABNORMAL   TONSILLECTOMY      Allergies  Allergen Reactions   Azithromycin Other (See Comments)    Reaction not recalled   Penicillins Hives and Other (See Comments)    Bad reaction as  a child after a shot of Penicillin   Pravastatin Other (See Comments)    Reaction not recalled   Sulfa Drugs Cross Reactors Other (See Comments)    Reaction not recalled   Naproxen Sodium Rash    Allergies as of 05/01/2021       Reactions   Azithromycin Other (See Comments)   Reaction not recalled   Penicillins Hives, Other (See Comments)   Bad reaction as a child after a shot of Penicillin   Pravastatin Other (See Comments)   Reaction not recalled   Sulfa Drugs Cross Reactors Other (See Comments)   Reaction not recalled   Naproxen Sodium Rash        Medication List        Accurate as of May 01, 2021  9:53 AM. If you  have any questions, ask your nurse or doctor.          STOP taking these medications    magnesium oxide 400 (240 Mg) MG tablet Commonly known as: MAG-OX Stopped by: Virgie Dad, MD   sodium chloride 0.65 % nasal spray Commonly known as: OCEAN Stopped by: Virgie Dad, MD       TAKE these medications    acetaminophen 500 MG tablet Commonly known as: TYLENOL Take 500 mg by mouth every 6 (six) hours as needed for mild pain or headache.   carvedilol 25 MG tablet Commonly known as: COREG Take 25 mg by mouth 2 (two) times daily with a meal.   fluticasone 50 MCG/ACT nasal spray Commonly known as: FLONASE Place 1-2 sprays into both nostrils in the morning.   loperamide 2 MG tablet Commonly known as: IMODIUM A-D Take 2 mg by mouth as needed for diarrhea or loose stools.   Multi Complete/Iron Tabs Take 1 tablet by mouth daily with breakfast.   ondansetron 4 MG tablet Commonly known as: ZOFRAN Take 1 tablet (4 mg total) by mouth every 6 (six) hours as needed for nausea.   PHENobarbital 32.4 MG tablet Commonly known as: LUMINAL Take 64.8 mg by mouth daily with breakfast.   phenytoin 100 MG ER capsule Commonly known as: DILANTIN Take 200 mg by mouth daily with breakfast.   senna-docusate 8.6-50 MG tablet Commonly known as: Senokot-S Take 1 tablet by mouth at bedtime as needed for mild constipation.   simvastatin 20 MG tablet Commonly known as: ZOCOR Take 20 mg by mouth at bedtime.   Systane Ultra PF 0.4-0.3 % Soln Generic drug: Polyethyl Glyc-Propyl Glyc PF Place 1 drop into both eyes in the morning and at bedtime.   Vitamin-B Complex Tabs Take 1 tablet by mouth daily.        Review of Systems Review of Systems  Constitutional: Negative for activity change, appetite change, chills, diaphoresis, fatigue and fever.  HENT: Negative for mouth sores, postnasal drip, rhinorrhea, sinus pain and sore throat.   Respiratory: Negative for apnea, cough, chest  tightness, shortness of breath and wheezing.   Cardiovascular: Negative for chest pain, palpitations and leg swelling.  Gastrointestinal: Negative for abdominal distention, abdominal pain, constipation, diarrhea, nausea and vomiting.  Genitourinary: Negative for dysuria and frequency.  Musculoskeletal: Negative for arthralgias, joint swelling and myalgias.  Skin: Negative for rash.  Neurological: Negative for dizziness, syncope, weakness, light-headedness and numbness.  Psychiatric/Behavioral: Negative for behavioral problems, confusion and sleep disturbance.    Immunization History  Administered Date(s) Administered   Influenza Split 03/28/2009, 03/17/2010, 03/26/2011, 04/01/2012, 03/05/2013   Influenza, Quadrivalent, Recombinant, Inj, Pf 03/08/2018, 02/13/2019,  02/24/2020   Influenza,inj,Quad PF,6+ Mos 02/25/2014, 03/05/2015   Influenza-Unspecified 02/09/2014, 02/25/2016, 04/06/2017   Moderna Sars-Covid-2 Vaccination 07/17/2019, 08/14/2019, 02/24/2020, 04/26/2020   Pneumococcal Conjugate-13 11/26/2014   Pneumococcal-Unspecified 06/23/2003, 11/10/2012   Td (Adult), 2 Lf Tetanus Toxid, Preservative Free 06/11/2000, 11/06/2011   Zoster Recombinat (Shingrix) 01/09/2019   Zoster, Live 01/15/2006, 01/15/2019   Pertinent  Health Maintenance Due  Topic Date Due   DEXA SCAN  Never done   INFLUENZA VACCINE  01/09/2021   Fall Risk 04/12/2021 04/12/2021 04/13/2021 04/13/2021 04/14/2021  Patient Fall Risk Level High fall risk High fall risk High fall risk High fall risk High fall risk   Functional Status Survey:    Vitals:   05/01/21 0947  BP: (!) 160/57  Pulse: 91  Resp: 14  Temp: 99 F (37.2 C)  SpO2: 95%  Weight: 150 lb 3.2 oz (68.1 kg)  Height: 5\' 4"  (1.626 m)   Body mass index is 25.78 kg/m. Physical Exam Constitutional: Oriented to person, place, and time. Well-developed and well-nourished.  HENT:  Head: Normocephalic.  Mouth/Throat: Oropharynx is clear and moist.  Eyes:  Pupils are equal, round, and reactive to light.  Neck: Neck supple.  Cardiovascular: Normal rate and normal heart sounds.  No murmur heard. Pulmonary/Chest: Effort normal and breath sounds normal. No respiratory distress. No wheezes. She has no rales.  Abdominal: Soft. Bowel sounds are normal. No distension. There is no tenderness. There is no rebound.  Musculoskeletal: No edema.  Lymphadenopathy: none Neurological: Alert and oriented to person, place, and time.  Skin: Skin is warm and dry.  Psychiatric: Normal mood and affect. Behavior is normal. Thought content normal.   Labs reviewed: Recent Labs    07/22/20 0804 07/22/20 1208 07/23/20 0442 07/24/20 0414 07/25/20 0203 08/03/20 0000 04/12/21 0340 04/13/21 0341 04/14/21 0338 04/17/21 0000 04/25/21 0000  NA 121*   < > 127*   < > 128*   < > 127* 131* 131* 134*  134* 136*  K 3.7   < > 3.1*   < > 3.5   < > 3.8 4.1 3.8 4.0  4.0 4.3  CL 90*   < > 95*   < > 99   < > 95* 99 98 96*  96* 98*  CO2 23   < > 23   < > 20*   < > 26 26 25  26*  26* 28*  GLUCOSE 124*   < > 116*   < > 114*   < > 103* 107* 111*  --   --   BUN 7*   < > 8   < > 23   < > 21 25* 28* 24*  24* 26*  CREATININE 0.71   < > 0.85   < > 1.09*   < > 0.74 0.80 0.87 0.9  0.9 1.1  CALCIUM 7.6*   < > 7.6*   < > 7.8*   < > 8.2* 8.4* 8.4* 8.9  8.9 9.1  MG 1.6*  --  1.4*  --  1.7   < > 1.6* 1.8 1.4* 1.7  --   PHOS 2.3*  --  2.6  --  3.4  --   --   --   --   --   --    < > = values in this interval not displayed.   Recent Labs    07/21/20 1745 04/10/21 1027 04/25/21 0000  AST 31 20 74*  ALT 16 11 61*  ALKPHOS 56 68 66  BILITOT  1.0 0.4  --   PROT 6.6 6.9  --   ALBUMIN 3.5 3.8 3.1*   Recent Labs    04/12/21 0340 04/13/21 0341 04/14/21 0338 04/17/21 0000 04/25/21 0000  WBC 6.8 8.1 7.3 4.9  4.9 7.1  NEUTROABS 4.7 5.9 5.0  --   --   HGB 8.8* 8.7* 8.6* 9.4*  9.4* 9.3*  HCT 25.7* 25.7* 24.8* 27*  27* 26*  MCV 98.5 98.8 97.3  --   --   PLT 176 188 190 220   220 389   Lab Results  Component Value Date   TSH 1.685 07/21/2020   No results found for: HGBA1C No results found for: CHOL, HDL, LDLCALC, LDLDIRECT, TRIG, CHOLHDL  Significant Diagnostic Results in last 30 days:  CT Abdomen Pelvis W Contrast  Result Date: 04/28/2021 CLINICAL DATA:  Abdominal distension, pain, diarrhea, and low-grade fever. EXAM: CT ABDOMEN AND PELVIS WITH CONTRAST TECHNIQUE: Multidetector CT imaging of the abdomen and pelvis was performed using the standard protocol following bolus administration of intravenous contrast. CONTRAST:  75 mL Omnipaque 350 COMPARISON:  None. FINDINGS: Lower chest: Mild atelectasis or scarring in the lung bases. No pleural effusion. Hepatobiliary: No focal liver abnormality is seen. There is a 2.1 cm peripherally calcified stone in the gallbladder without pericholecystic inflammation or biliary dilatation. Pancreas: Unremarkable. Spleen: Subcentimeter hypodensity anteriorly in the spleen, too small to fully characterize. Adrenals/Urinary Tract: Unremarkable adrenal glands. Renal cysts measuring 1.7 cm on the right and 2.7 cm on the left. Additional subcentimeter hypodensities in both kidneys, too small to fully characterize. No renal calculi or hydronephrosis. Unremarkable bladder. Stomach/Bowel: The stomach is unremarkable. There is no evidence of bowel obstruction or inflammation. Minimal scattered colonic diverticulosis is noted without evidence of acute diverticulitis. History of appendectomy. Vascular/Lymphatic: Abdominal aortic atherosclerosis without aneurysm. No enlarged lymph nodes. Reproductive: Uterus and bilateral adnexa are unremarkable. Other: No ascites or pneumoperitoneum. Moderate-sized fat containing supraumbilical hernia. Musculoskeletal: No suspicious osseous lesion. Scoliosis and moderate to severe disc and facet degeneration in the lumbar spine. Soft tissue calcifications about the hip joints, greater trochanters, and ischial  tuberosities which may reflect hydroxyapatite deposition. IMPRESSION: 1. No acute abnormality identified in the abdomen or pelvis. 2. Cholelithiasis. 3. Fat containing supraumbilical hernia. 4. Aortic Atherosclerosis (ICD10-I70.0). Electronically Signed   By: Logan Bores M.D.   On: 04/28/2021 11:35    Assessment/Plan  Acute diarrhea Now it is sporadic. But better then before  Has Appointment with GI for intermittent diarrhea and 2 severe Episodes with Hyponatremia CT scan of Abdomen was negative except Gall stones. She is Asymptomatic Repeat Hepatic panel C Diff negative. But was occult Positive  Low grade fever Resolved now Was treated for UTI with Levaquin Hyponatremia Sodium stable now Repeat BMP  Heme positive stool Checked in the facility Hgb stable Not on iron due to Nausea  Does have GI appointment Essential hypertension On Coreg  Mixed hyperlipidemia On statin Closed displaced comminuted fracture of shaft of right humerus with delayed healing Patient Is not having any pain and says she has seen Ortho before for this Does not want to see ortho right now Partial symptomatic epilepsy with complex partial seizures, not intractable, without status epilepticus ([place]) On Pheynytoin and Phenobarb  Family/ staff Communication:   Labs/tests ordered:   Hepatic Panel,CBC,BMP  Total time spent in this patient care encounter was  45_  minutes; greater than 50% of the visit spent counseling patient and staff, reviewing records , Labs and coordinating care for  problems addressed at this encounter.

## 2021-05-02 DIAGNOSIS — M4807 Spinal stenosis, lumbosacral region: Secondary | ICD-10-CM | POA: Diagnosis not present

## 2021-05-02 DIAGNOSIS — R278 Other lack of coordination: Secondary | ICD-10-CM | POA: Diagnosis not present

## 2021-05-02 DIAGNOSIS — R2689 Other abnormalities of gait and mobility: Secondary | ICD-10-CM | POA: Diagnosis not present

## 2021-05-02 DIAGNOSIS — M6281 Muscle weakness (generalized): Secondary | ICD-10-CM | POA: Diagnosis not present

## 2021-05-02 DIAGNOSIS — E871 Hypo-osmolality and hyponatremia: Secondary | ICD-10-CM | POA: Diagnosis not present

## 2021-05-02 LAB — BASIC METABOLIC PANEL
BUN: 29 — AB (ref 4–21)
CO2: 25 — AB (ref 13–22)
Chloride: 98 — AB (ref 99–108)
Creatinine: 1.4 — AB (ref 0.5–1.1)
Glucose: 110
Potassium: 3.9 (ref 3.4–5.3)
Sodium: 137 (ref 137–147)

## 2021-05-02 LAB — COMPREHENSIVE METABOLIC PANEL
Albumin: 3.5 (ref 3.5–5.0)
Calcium: 9.3 (ref 8.7–10.7)

## 2021-05-02 LAB — CBC AND DIFFERENTIAL
HCT: 30 — AB (ref 36–46)
Hemoglobin: 9.8 — AB (ref 12.0–16.0)
Platelets: 341 (ref 150–399)
WBC: 5.4

## 2021-05-02 LAB — HEPATIC FUNCTION PANEL
ALT: 23 (ref 7–35)
AST: 26 (ref 13–35)
Alkaline Phosphatase: 67 (ref 25–125)
Bilirubin, Direct: 0.2 (ref 0.01–0.4)
Bilirubin, Total: 0.2

## 2021-05-02 LAB — CBC: RBC: 3.02 — AB (ref 3.87–5.11)

## 2021-05-03 DIAGNOSIS — M6281 Muscle weakness (generalized): Secondary | ICD-10-CM | POA: Diagnosis not present

## 2021-05-03 DIAGNOSIS — R278 Other lack of coordination: Secondary | ICD-10-CM | POA: Diagnosis not present

## 2021-05-03 DIAGNOSIS — M4807 Spinal stenosis, lumbosacral region: Secondary | ICD-10-CM | POA: Diagnosis not present

## 2021-05-03 DIAGNOSIS — R2689 Other abnormalities of gait and mobility: Secondary | ICD-10-CM | POA: Diagnosis not present

## 2021-05-03 DIAGNOSIS — E871 Hypo-osmolality and hyponatremia: Secondary | ICD-10-CM | POA: Diagnosis not present

## 2021-05-04 DIAGNOSIS — R197 Diarrhea, unspecified: Secondary | ICD-10-CM | POA: Diagnosis not present

## 2021-05-04 LAB — BASIC METABOLIC PANEL
BUN: 33 — AB (ref 4–21)
CO2: 26 — AB (ref 13–22)
Chloride: 100 (ref 99–108)
Creatinine: 1.3 — AB (ref 0.5–1.1)
Glucose: 105
Potassium: 4.2 (ref 3.4–5.3)
Sodium: 138 (ref 137–147)

## 2021-05-04 LAB — COMPREHENSIVE METABOLIC PANEL: Calcium: 9.2 (ref 8.7–10.7)

## 2021-05-05 DIAGNOSIS — M4807 Spinal stenosis, lumbosacral region: Secondary | ICD-10-CM | POA: Diagnosis not present

## 2021-05-05 DIAGNOSIS — E871 Hypo-osmolality and hyponatremia: Secondary | ICD-10-CM | POA: Diagnosis not present

## 2021-05-05 DIAGNOSIS — M6281 Muscle weakness (generalized): Secondary | ICD-10-CM | POA: Diagnosis not present

## 2021-05-08 ENCOUNTER — Other Ambulatory Visit: Payer: Self-pay | Admitting: Adult Health

## 2021-05-08 DIAGNOSIS — E871 Hypo-osmolality and hyponatremia: Secondary | ICD-10-CM | POA: Diagnosis not present

## 2021-05-08 DIAGNOSIS — R2689 Other abnormalities of gait and mobility: Secondary | ICD-10-CM | POA: Diagnosis not present

## 2021-05-08 DIAGNOSIS — R278 Other lack of coordination: Secondary | ICD-10-CM | POA: Diagnosis not present

## 2021-05-08 DIAGNOSIS — M6281 Muscle weakness (generalized): Secondary | ICD-10-CM | POA: Diagnosis not present

## 2021-05-08 DIAGNOSIS — M4807 Spinal stenosis, lumbosacral region: Secondary | ICD-10-CM | POA: Diagnosis not present

## 2021-05-08 LAB — URIC ACID: Uric Acid: 6.2

## 2021-05-08 MED ORDER — PHENOBARBITAL 32.4 MG PO TABS: 64.8000 mg | ORAL_TABLET | Freq: Every day | ORAL | 1 refills | Status: DC

## 2021-05-09 DIAGNOSIS — E871 Hypo-osmolality and hyponatremia: Secondary | ICD-10-CM | POA: Diagnosis not present

## 2021-05-09 DIAGNOSIS — M4807 Spinal stenosis, lumbosacral region: Secondary | ICD-10-CM | POA: Diagnosis not present

## 2021-05-09 DIAGNOSIS — R278 Other lack of coordination: Secondary | ICD-10-CM | POA: Diagnosis not present

## 2021-05-09 DIAGNOSIS — R2689 Other abnormalities of gait and mobility: Secondary | ICD-10-CM | POA: Diagnosis not present

## 2021-05-10 ENCOUNTER — Other Ambulatory Visit (INDEPENDENT_AMBULATORY_CARE_PROVIDER_SITE_OTHER): Payer: Medicare Other

## 2021-05-10 ENCOUNTER — Encounter: Payer: Self-pay | Admitting: Gastroenterology

## 2021-05-10 ENCOUNTER — Ambulatory Visit (INDEPENDENT_AMBULATORY_CARE_PROVIDER_SITE_OTHER): Payer: Medicare Other | Admitting: Gastroenterology

## 2021-05-10 VITALS — BP 136/60 | HR 67 | Ht 66.0 in | Wt 151.0 lb

## 2021-05-10 DIAGNOSIS — M6281 Muscle weakness (generalized): Secondary | ICD-10-CM | POA: Diagnosis not present

## 2021-05-10 DIAGNOSIS — R7989 Other specified abnormal findings of blood chemistry: Secondary | ICD-10-CM | POA: Diagnosis not present

## 2021-05-10 DIAGNOSIS — R197 Diarrhea, unspecified: Secondary | ICD-10-CM

## 2021-05-10 DIAGNOSIS — E871 Hypo-osmolality and hyponatremia: Secondary | ICD-10-CM

## 2021-05-10 DIAGNOSIS — D509 Iron deficiency anemia, unspecified: Secondary | ICD-10-CM

## 2021-05-10 DIAGNOSIS — M4807 Spinal stenosis, lumbosacral region: Secondary | ICD-10-CM | POA: Diagnosis not present

## 2021-05-10 DIAGNOSIS — I6523 Occlusion and stenosis of bilateral carotid arteries: Secondary | ICD-10-CM | POA: Diagnosis not present

## 2021-05-10 LAB — CBC WITH DIFFERENTIAL/PLATELET
Basophils Absolute: 0 10*3/uL (ref 0.0–0.1)
Basophils Relative: 0.9 % (ref 0.0–3.0)
Eosinophils Absolute: 0 10*3/uL (ref 0.0–0.7)
Eosinophils Relative: 0 % (ref 0.0–5.0)
HCT: 27.6 % — ABNORMAL LOW (ref 36.0–46.0)
Hemoglobin: 9.5 g/dL — ABNORMAL LOW (ref 12.0–15.0)
Lymphocytes Relative: 16.7 % (ref 12.0–46.0)
Lymphs Abs: 0.9 10*3/uL (ref 0.7–4.0)
MCHC: 34.3 g/dL (ref 30.0–36.0)
MCV: 97.9 fl (ref 78.0–100.0)
Monocytes Absolute: 0.6 10*3/uL (ref 0.1–1.0)
Monocytes Relative: 11 % (ref 3.0–12.0)
Neutro Abs: 3.8 10*3/uL (ref 1.4–7.7)
Neutrophils Relative %: 71.4 % (ref 43.0–77.0)
Platelets: 249 10*3/uL (ref 150.0–400.0)
RBC: 2.82 Mil/uL — ABNORMAL LOW (ref 3.87–5.11)
RDW: 13.5 % (ref 11.5–15.5)
WBC: 5.3 10*3/uL (ref 4.0–10.5)

## 2021-05-10 LAB — IBC + FERRITIN
Ferritin: 232.7 ng/mL (ref 10.0–291.0)
Iron: 96 ug/dL (ref 42–145)
Saturation Ratios: 44 % (ref 20.0–50.0)
TIBC: 218.4 ug/dL — ABNORMAL LOW (ref 250.0–450.0)
Transferrin: 156 mg/dL — ABNORMAL LOW (ref 212.0–360.0)

## 2021-05-10 LAB — COMPREHENSIVE METABOLIC PANEL WITH GFR
ALT: 12 U/L (ref 0–35)
AST: 19 U/L (ref 0–37)
Albumin: 3.7 g/dL (ref 3.5–5.2)
Alkaline Phosphatase: 47 U/L (ref 39–117)
BUN: 32 mg/dL — ABNORMAL HIGH (ref 6–23)
CO2: 28 meq/L (ref 19–32)
Calcium: 8.6 mg/dL (ref 8.4–10.5)
Chloride: 102 meq/L (ref 96–112)
Creatinine, Ser: 1.31 mg/dL — ABNORMAL HIGH (ref 0.40–1.20)
GFR: 37.79 mL/min — ABNORMAL LOW
Glucose, Bld: 100 mg/dL — ABNORMAL HIGH (ref 70–99)
Potassium: 4.1 meq/L (ref 3.5–5.1)
Sodium: 140 meq/L (ref 135–145)
Total Bilirubin: 0.3 mg/dL (ref 0.2–1.2)
Total Protein: 6.7 g/dL (ref 6.0–8.3)

## 2021-05-10 NOTE — Patient Instructions (Signed)
Your provider has requested that you go to the basement level for lab work before leaving today. Press "B" on the elevator. The lab is located at the first door on the left as you exit the elevator.   Follow instructions on Hemoccult cards, mail back to the lab in self addressed envelope or bring back directly to the lab  If you are age 83 or older, your body mass index should be between 23-30. Your Body mass index is 24.37 kg/m. If this is out of the aforementioned range listed, please consider follow up with your Primary Care Provider.  If you are age 55 or younger, your body mass index should be between 19-25. Your Body mass index is 24.37 kg/m. If this is out of the aformentioned range listed, please consider follow up with your Primary Care Provider.   ________________________________________________________  The [facility] providers would like to encourage you to use [facility] to communicate with providers for non-urgent requests or questions.  Due to long hold times on the telephone, sending your provider a message by [facility] may be a faster and more efficient way to get a response.  Please allow 48 business hours for a response.  Please remember that this is for non-urgent requests.  _______________________________________________________   Due to recent changes in healthcare laws, you may see the results of your imaging and laboratory studies on MyChart before your provider has had a chance to review them.  We understand that in some cases there may be results that are confusing or concerning to you. Not all laboratory results come back in the same time frame and the provider may be waiting for multiple results in order to interpret others.  Please give [place] 48 hours in order for your provider to thoroughly review all the results before contacting the office for clarification of your results.    I appreciate the  opportunity to care for you  Thank You   Harl Bowie , MD

## 2021-05-10 NOTE — Progress Notes (Signed)
05/10/2021 [name] [id number] 12-20-1935   HISTORY OF PRESENT ILLNESS: This is an 85 year old female who is a patient of Dr. Blanch Media.  She has not been seen here since 2011 when she had her last colonoscopy.  She presents here today with complaints of 2 episodes of diarrhea.  She tells me that she had an episode in February and then had another episode in November.  On both occasions her sodium levels dropped and in November she required hospitalization.  At this point her diarrhea has resolved and she is feeling pretty well without any issues.  She says that her bowel movements are soft.  She had a CT scan of the abdomen and pelvis with contrast in November and that showed a fat-containing supraumbilical hernia, cholelithiasis, aortic atherosclerosis, but no other acute findings to account for symptoms.  She denies any abdominal pain or blood in her stools.  During these episodes her appetite had decreased she says that has been a little bit better now.  She is wanting to know what we think caused these episodes.  Upon review of her labs it was noted that she has a normocytic anemia.  Hemoglobin 9.8 g and MCV 97.9.    Colonoscopy October 2011 showed mild diverticulosis in the sigmoid colon and internal hemorrhoids.  Colonoscopy February 2006 she had adenomatous polyps removed   Past Medical History:  Diagnosis Date   Abnormal cardiovascular stress test    Carotid arterial disease (HCC)    MODERATE RIGHT   Cerebral AV malformation    DDD (degenerative disc disease)    SEVERE WITH SPINAL STENOSIS   Hypercholesterolemia    Hypertension    MVP (mitral valve prolapse)    MILD   Numbness    LEFT FOOT AND ANKLE   Osteopenia    Pseudo-gout    Past Surgical History:  Procedure Laterality Date   APPENDECTOMY     BREAST MASS EXCISION     CARPAL TUNNEL RELEASE     REMOVAL OF PARATHYROID GLAND     STRESS NUCLEAR STUDY     ABNORMAL   TONSILLECTOMY      reports that she  quit smoking about 52 years ago. Her smoking use included cigarettes. She has a 14.00 pack-year smoking history. She has never used smokeless tobacco. She reports that she does not drink alcohol and does not use drugs. family history includes Lung cancer in her father; Stroke in her mother. Allergies  Allergen Reactions   Azithromycin Other (See Comments)    Reaction not recalled   Penicillins Hives and Other (See Comments)    Bad reaction as a child after a shot of Penicillin   Pravastatin Other (See Comments)    Reaction not recalled   Sulfa Drugs Cross Reactors Other (See Comments)    Reaction not recalled   Naproxen Sodium Rash      Outpatient Encounter Medications as of 05/10/2021  Medication Sig   acetaminophen (TYLENOL) 500 MG tablet Take 500 mg by mouth every 6 (six) hours as needed for mild pain or headache.   B Complex Vitamins (VITAMIN-B COMPLEX) TABS Take 1 tablet by mouth daily.   carvedilol (COREG) 25 MG tablet Take 25 mg by mouth 2 (two) times daily with a meal.   fluticasone (FLONASE) 50 MCG/ACT nasal spray Place 1-2 sprays into both nostrils in the morning.   loperamide (IMODIUM A-D) 2 MG tablet Take 2 mg by mouth as needed for diarrhea or loose stools.  Multiple Vitamins-Minerals (MULTI COMPLETE/IRON) TABS Take 1 tablet by mouth daily with breakfast.   ondansetron (ZOFRAN) 4 MG tablet Take 1 tablet (4 mg total) by mouth every 6 (six) hours as needed for nausea.   PHENobarbital (LUMINAL) 32.4 MG tablet Take 2 tablets (64.8 mg total) by mouth daily with breakfast.   phenytoin (DILANTIN) 100 MG ER capsule Take 200 mg by mouth daily with breakfast.   simvastatin (ZOCOR) 20 MG tablet Take 20 mg by mouth at bedtime.   SYSTANE ULTRA PF 0.4-0.3 % SOLN Place 1 drop into both eyes in the morning and at bedtime.   [DISCONTINUED] senna-docusate (SENOKOT-S) 8.6-50 MG tablet Take 1 tablet by mouth at bedtime as needed for mild constipation.   No facility-administered encounter  medications on file as of 05/10/2021.    REVIEW OF SYSTEMS  : All other systems reviewed and negative except where noted in the History of Present Illness.   PHYSICAL EXAM: BP 136/60   Pulse 67   Ht 5\' 6"  (1.676 m)   Wt 151 lb (68.5 kg)   BMI 24.37 kg/m  General: Well developed white female in no acute distress Head: Normocephalic and atraumatic Eyes:  Sclerae anicteric, conjunctiva pink. Ears: Normal auditory acuity Lungs: Clear throughout to auscultation; no W/R/R. Heart: Regular rate and rhythm; no M/R/G. Abdomen: Soft, non-distended.  BS present.  Non-tender. Musculoskeletal: Symmetrical with no gross deformities  Skin: No lesions on visible extremities Extremities: No edema  Neurological: Alert oriented x 4, grossly non-focal Psychological:  Alert and cooperative. Normal mood and affect  ASSESSMENT AND PLAN: *85 year old female with 2 episodes of diarrhea this past year, 1 in February and 1 in November.  Diarrhea was so severe in November that she ended up hospitalized for hyponatremia.  She is wanting to know what we think caused these episodes.  Her stools are back to normal now and she felt fine in between episodes.  My best guess was that she had some type of infectious gastroenteritis on both occasions.  Recent CT scan in November was unremarkable. *Normocytic anemia: Hemoglobin stable in the 9 g range.  Suspect anemia of chronic disease.  We will check celiac labs and iron studies.  *Hyponatremia: During her episodes of diarrhea.  We will recheck BMP today.  CC:  Burnard Bunting, MD

## 2021-05-11 DIAGNOSIS — R2689 Other abnormalities of gait and mobility: Secondary | ICD-10-CM | POA: Diagnosis not present

## 2021-05-11 DIAGNOSIS — E871 Hypo-osmolality and hyponatremia: Secondary | ICD-10-CM | POA: Diagnosis not present

## 2021-05-11 DIAGNOSIS — M4807 Spinal stenosis, lumbosacral region: Secondary | ICD-10-CM | POA: Diagnosis not present

## 2021-05-11 DIAGNOSIS — M6281 Muscle weakness (generalized): Secondary | ICD-10-CM | POA: Diagnosis not present

## 2021-05-11 DIAGNOSIS — R278 Other lack of coordination: Secondary | ICD-10-CM | POA: Diagnosis not present

## 2021-05-11 LAB — IGA: Immunoglobulin A: 228 mg/dL (ref 70–320)

## 2021-05-11 LAB — TISSUE TRANSGLUTAMINASE, IGA: (tTG) Ab, IgA: <1 U/mL

## 2021-05-12 DIAGNOSIS — M4807 Spinal stenosis, lumbosacral region: Secondary | ICD-10-CM | POA: Diagnosis not present

## 2021-05-12 DIAGNOSIS — E871 Hypo-osmolality and hyponatremia: Secondary | ICD-10-CM | POA: Diagnosis not present

## 2021-05-12 DIAGNOSIS — M6281 Muscle weakness (generalized): Secondary | ICD-10-CM | POA: Diagnosis not present

## 2021-05-15 ENCOUNTER — Non-Acute Institutional Stay (SKILLED_NURSING_FACILITY): Payer: Medicare Other | Admitting: Adult Health

## 2021-05-15 ENCOUNTER — Encounter: Payer: Self-pay | Admitting: Adult Health

## 2021-05-15 DIAGNOSIS — G40209 Localization-related (focal) (partial) symptomatic epilepsy and epileptic syndromes with complex partial seizures, not intractable, without status epilepticus: Secondary | ICD-10-CM

## 2021-05-15 DIAGNOSIS — R269 Unspecified abnormalities of gait and mobility: Secondary | ICD-10-CM | POA: Diagnosis not present

## 2021-05-15 DIAGNOSIS — E7800 Pure hypercholesterolemia, unspecified: Secondary | ICD-10-CM | POA: Diagnosis not present

## 2021-05-15 DIAGNOSIS — I10 Essential (primary) hypertension: Secondary | ICD-10-CM | POA: Diagnosis not present

## 2021-05-15 DIAGNOSIS — R278 Other lack of coordination: Secondary | ICD-10-CM | POA: Diagnosis not present

## 2021-05-15 DIAGNOSIS — D508 Other iron deficiency anemias: Secondary | ICD-10-CM | POA: Diagnosis not present

## 2021-05-15 DIAGNOSIS — M6281 Muscle weakness (generalized): Secondary | ICD-10-CM | POA: Diagnosis not present

## 2021-05-15 DIAGNOSIS — S42351G Displaced comminuted fracture of shaft of humerus, right arm, subsequent encounter for fracture with delayed healing: Secondary | ICD-10-CM

## 2021-05-15 DIAGNOSIS — R197 Diarrhea, unspecified: Secondary | ICD-10-CM

## 2021-05-15 DIAGNOSIS — E871 Hypo-osmolality and hyponatremia: Secondary | ICD-10-CM | POA: Diagnosis not present

## 2021-05-15 DIAGNOSIS — R2689 Other abnormalities of gait and mobility: Secondary | ICD-10-CM | POA: Diagnosis not present

## 2021-05-15 DIAGNOSIS — E782 Mixed hyperlipidemia: Secondary | ICD-10-CM | POA: Diagnosis not present

## 2021-05-15 DIAGNOSIS — M4807 Spinal stenosis, lumbosacral region: Secondary | ICD-10-CM | POA: Diagnosis not present

## 2021-05-15 NOTE — Progress Notes (Signed)
Location:   [place] Room Number: 578 Place of Service:  [phone])  Provider: Royal Hawthorn, NP  PCP: Burnard Bunting, MD Patient Care Team: Burnard Bunting, MD as PCP - General (Internal Medicine)  Extended Emergency Contact Information Primary Emergency Contact: G[place]on Mobile Phone: [phone] Relation: Daughter Secondary Emergency Contact: LEIGHTYN, CINA Mobile Phone: [phone] Relation: Son  Code Status: DNR Goals of care:  Advanced Directive information Advanced Directives 04/17/2021  Does Patient Have a Medical Advance Directive? Yes  Type of Paramedic of [place];Out of facility DNR (pink MOST or yellow form)  Does patient want to make changes to medical advance directive? No - Patient declined  Copy of [place] in Chart? No - copy requested     Allergies  Allergen Reactions   Azithromycin Other (See Comments)    Reaction not recalled   Penicillins Hives and Other (See Comments)    Bad reaction as a child after a shot of Penicillin   Pravastatin Other (See Comments)    Reaction not recalled   Sulfa Drugs Cross Reactors Other (See Comments)    Reaction not recalled   Naproxen Sodium Rash    Chief Complaint  Patient presents with   Discharge Note    Discharge from SNF    HPI:  85 y.o. female seen for discharge from [facility] assisted living after a hospitalization for hyponatremia and diarrhea 04/10/21-04/14/21. On admission her NA was 122 and she was given IVF. The hyponatremia was thought to be due to hypovolemia. Na on 05/10/21 140.  The diarrhea was felt to be infectious in nature. She had a similar episode in Feb of this year. During her rehab stay she had some low grade temps and fatigue and progressed slowly. A CXR was done which showed  no acute process but she had a right displaced humerus fx incidentally found. She declined ortho referral because she said  this occurred years ago after a fall and is not new. UA did show Ecoli so shew as treated with Levaquin. She did continue to have some diarrhea in the rehab setting. Stool was neg for cdiff but was occult pos. She was referred to GI. They felt her diarrhea was infectious. No new therapy prescribed. Hemoccults ordered x 3 at [facility] which were subsequently negative and her diarrhea resolved. CT of the abd and pelvis 04/27/21 showed no acute process but she did have some cholelithiasis. She does also have chronic anemia and takes a multivitamin with iron. Hgb 9.5 05/10/21 and ferritin and iron levels were normal  She transitioned from rehab to AL and has been independent in ADLs. She is ambulatory with a walker and alert and oriented. Had a bout of gout during her stay as well at the great toe. Uric acid 6.3. No further issues with pain or swelling are noted [place] back [place] and would like to follow with [facility] Past Medical History:  Diagnosis Date   Abnormal cardiovascular stress test    Carotid arterial disease ([place])    MODERATE RIGHT   Cerebral AV malformation    DDD (degenerative disc disease)    SEVERE WITH SPINAL STENOSIS   Hypercholesterolemia    Hypertension    MVP (mitral valve prolapse)    MILD   Numbness    LEFT FOOT AND ANKLE   Osteopenia    Pseudo-gout     Past Surgical History:  Procedure Laterality Date   APPENDECTOMY  BREAST MASS EXCISION     CARPAL TUNNEL RELEASE     REMOVAL OF PARATHYROID GLAND     STRESS NUCLEAR STUDY     ABNORMAL   TONSILLECTOMY        reports that she quit smoking about 52 years ago. Her smoking use included cigarettes. She has a 14.00 pack-year smoking history. She has never used smokeless tobacco. She reports that she does not drink alcohol and does not use drugs. Social History   Socioeconomic History   Marital status: Widowed    Spouse name: Not on file   Number of children: 3   Years of education: Not on file    Highest education level: Not on file  Occupational History   Not on file  Tobacco Use   Smoking status: Former    Packs/day: 1.00    Years: 14.00    Pack years: 14.00    Types: Cigarettes    Quit date: 11/07/1968    Years since quitting: 52.5   Smokeless tobacco: Never  Vaping Use   Vaping Use: Never used  Substance and Sexual Activity   Alcohol use: No   Drug use: No   Sexual activity: Not on file  Other Topics Concern   Not on file  Social History Narrative   [place] home [place] Strain: Not on file  Food Insecurity: Not on file  Transportation Needs: Not on file  Physical Activity: Not on file  Stress: Not on file  Social Connections: Not on file  Intimate Partner Violence: Not on file   Functional Status Survey:    Allergies  Allergen Reactions   Azithromycin Other (See Comments)    Reaction not recalled   Penicillins Hives and Other (See Comments)    Bad reaction as a child after a shot of Penicillin   Pravastatin Other (See Comments)    Reaction not recalled   Sulfa Drugs Cross Reactors Other (See Comments)    Reaction not recalled   Naproxen Sodium Rash    Pertinent  Health Maintenance Due  Topic Date Due   DEXA SCAN  Never done   INFLUENZA VACCINE  01/09/2021    Medications: Allergies as of 05/15/2021       Reactions   Azithromycin Other (See Comments)   Reaction not recalled   Penicillins Hives, Other (See Comments)   Bad reaction as a child after a shot of Penicillin   Pravastatin Other (See Comments)   Reaction not recalled   Sulfa Drugs Cross Reactors Other (See Comments)   Reaction not recalled   Naproxen Sodium Rash        Medication List        Accurate as of May 15, 2021  2:45 PM. If you have any questions, ask your nurse or doctor.          acetaminophen 500 MG tablet Commonly known as: TYLENOL Take 500 mg by mouth every 6 (six) hours as needed for  mild pain or headache.   carvedilol 25 MG tablet Commonly known as: COREG Take 25 mg by mouth 2 (two) times daily with a meal.   fluticasone 50 MCG/ACT nasal spray Commonly known as: FLONASE Place 1-2 sprays into both nostrils in the morning.   loperamide 2 MG tablet Commonly known as: IMODIUM A-D Take 2 mg by mouth as needed for diarrhea or loose stools.   Multi Complete/Iron Tabs Take 1 tablet by mouth  daily with breakfast.   nystatin powder Generic drug: nystatin Apply 1 application topically 2 (two) times daily.   ondansetron 4 MG tablet Commonly known as: ZOFRAN Take 1 tablet (4 mg total) by mouth every 6 (six) hours as needed for nausea.   PHENobarbital 32.4 MG tablet Commonly known as: LUMINAL Take 2 tablets (64.8 mg total) by mouth daily with breakfast.   phenytoin 100 MG ER capsule Commonly known as: DILANTIN Take 200 mg by mouth daily with breakfast.   sennosides-docusate sodium 8.6-50 MG tablet Commonly known as: SENOKOT-S Take 1 tablet by mouth at bedtime.   simvastatin 20 MG tablet Commonly known as: ZOCOR Take 20 mg by mouth at bedtime.   Systane Ultra PF 0.4-0.3 % Soln Generic drug: Polyethyl Glyc-Propyl Glyc PF Place 1 drop into both eyes in the morning and at bedtime.   Vitamin-B Complex Tabs Take 1 tablet by mouth daily.        Review of Systems  Constitutional:  Negative for activity change, appetite change, chills, diaphoresis, fatigue, fever and unexpected weight change.  HENT:  Negative for congestion.   Respiratory:  Negative for cough, shortness of breath and wheezing.   Cardiovascular:  Negative for chest pain, palpitations and leg swelling.  Gastrointestinal:  Negative for abdominal distention, abdominal pain, constipation and diarrhea.  Genitourinary:  Negative for difficulty urinating and dysuria.  Musculoskeletal:  Positive for arthralgias and gait problem. Negative for back pain, joint swelling and myalgias.  Neurological:   Negative for dizziness, tremors, seizures, syncope, facial asymmetry, speech difficulty, weakness, light-headedness, numbness and headaches.  Psychiatric/Behavioral:  Negative for agitation, behavioral problems and confusion.    Vitals:   05/15/21 1428  BP: (!) 165/54  Pulse: 76  Resp: 18  Temp: (!) 97.4 F (36.3 C)  SpO2: 96%  Weight: 148 lb 9.6 oz (67.4 kg)  Height: 5\' 6"  (1.676 m)   Body mass index is 23.98 kg/m. Physical Exam Vitals and nursing note reviewed.  Constitutional:      General: She is not in acute distress.    Appearance: She is not diaphoretic.  HENT:     Head: Normocephalic and atraumatic.  Neck:     Vascular: No JVD.  Cardiovascular:     Rate and Rhythm: Normal rate and regular rhythm.     Heart sounds: No murmur heard. Pulmonary:     Effort: Pulmonary effort is normal. No respiratory distress.     Breath sounds: Normal breath sounds. No wheezing.  Abdominal:     General: Bowel sounds are normal. There is no distension.     Palpations: Abdomen is soft.  Musculoskeletal:     Comments: Decreased ROM and mild deformity at the right shoulder proximal humerus area. No swelling, bruising, tenderness, or erythema. +CMS to RUE  Skin:    General: Skin is warm and dry.  Neurological:     Mental Status: She is alert.  Psychiatric:        Mood and Affect: Mood normal.    Labs reviewed: Basic Metabolic Panel: Recent Labs    07/22/20 0804 07/22/20 1208 07/23/20 0442 07/24/20 0414 07/25/20 0203 08/03/20 0000 04/13/21 0341 04/14/21 0338 04/17/21 0000 04/25/21 0000 05/02/21 0000 05/04/21 0000 05/10/21 1113  NA 121*   < > 127*   < > 128*   < > 131* 131* 134*  134*   < > 137 138 140  K 3.7   < > 3.1*   < > 3.5   < > 4.1 3.8 4.0  4.0   < > 3.9 4.2 4.1  CL 90*   < > 95*   < > 99   < > 99 98 96*  96*   < > 98* 100 102  CO2 23   < > 23   < > 20*   < > 26 25 26*  26*   < > 25* 26* 28  GLUCOSE 124*   < > 116*   < > 114*   < > 107* 111*  --   --   --   --   100*  BUN 7*   < > 8   < > 23   < > 25* 28* 24*  24*   < > 29* 33* 32*  CREATININE 0.71   < > 0.85   < > 1.09*   < > 0.80 0.87 0.9  0.9   < > 1.4* 1.3* 1.31*  CALCIUM 7.6*   < > 7.6*   < > 7.8*   < > 8.4* 8.4* 8.9  8.9   < > 9.3 9.2 8.6  MG 1.6*  --  1.4*  --  1.7   < > 1.8 1.4* 1.7  --   --   --   --   PHOS 2.3*  --  2.6  --  3.4  --   --   --   --   --   --   --   --    < > = values in this interval not displayed.   Liver Function Tests: Recent Labs    07/21/20 1745 04/10/21 1027 04/25/21 0000 05/02/21 0000 05/10/21 1113  AST 31 20 74* 26 19  ALT 16 11 61* 23 12  ALKPHOS 56 68 66 67 47  BILITOT 1.0 0.4  --   --  0.3  PROT 6.6 6.9  --   --  6.7  ALBUMIN 3.5 3.8 3.1* 3.5 3.7   Recent Labs    04/10/21 1027  LIPASE 61*   No results for input(s): AMMONIA in the last 8760 hours. CBC: Recent Labs    04/13/21 0341 04/14/21 0338 04/17/21 0000 04/25/21 0000 05/02/21 0000 05/10/21 1113  WBC 8.1 7.3   < > 7.1 5.4 5.3  NEUTROABS 5.9 5.0  --   --   --  3.8  HGB 8.7* 8.6*   < > 9.3* 9.8* 9.5*  HCT 25.7* 24.8*   < > 26* 30* 27.6*  MCV 98.8 97.3  --   --   --  97.9  PLT 188 190   < > 389 341 249.0   < > = values in this interval not displayed.   Cardiac Enzymes: No results for input(s): CKTOTAL, CKMB, CKMBINDEX, TROPONINI in the last 8760 hours. BNP: Invalid input(s): POCBNP CBG: No results for input(s): GLUCAP in the last 8760 hours.  Procedures and Imaging Studies During Stay: CT Abdomen Pelvis W Contrast  Result Date: 04/28/2021 CLINICAL DATA:  Abdominal distension, pain, diarrhea, and low-grade fever. EXAM: CT ABDOMEN AND PELVIS WITH CONTRAST TECHNIQUE: Multidetector CT imaging of the abdomen and pelvis was performed using the standard protocol following bolus administration of intravenous contrast. CONTRAST:  75 mL Omnipaque 350 COMPARISON:  None. FINDINGS: Lower chest: Mild atelectasis or scarring in the lung bases. No pleural effusion. Hepatobiliary: No focal  liver abnormality is seen. There is a 2.1 cm peripherally calcified stone in the gallbladder without pericholecystic inflammation or biliary dilatation. Pancreas: Unremarkable. Spleen: Subcentimeter hypodensity anteriorly in the spleen,  too small to fully characterize. Adrenals/Urinary Tract: Unremarkable adrenal glands. Renal cysts measuring 1.7 cm on the right and 2.7 cm on the left. Additional subcentimeter hypodensities in both kidneys, too small to fully characterize. No renal calculi or hydronephrosis. Unremarkable bladder. Stomach/Bowel: The stomach is unremarkable. There is no evidence of bowel obstruction or inflammation. Minimal scattered colonic diverticulosis is noted without evidence of acute diverticulitis. History of appendectomy. Vascular/Lymphatic: Abdominal aortic atherosclerosis without aneurysm. No enlarged lymph nodes. Reproductive: Uterus and bilateral adnexa are unremarkable. Other: No ascites or pneumoperitoneum. Moderate-sized fat containing supraumbilical hernia. Musculoskeletal: No suspicious osseous lesion. Scoliosis and moderate to severe disc and facet degeneration in the lumbar spine. Soft tissue calcifications about the hip joints, greater trochanters, and ischial tuberosities which may reflect hydroxyapatite deposition. IMPRESSION: 1. No acute abnormality identified in the abdomen or pelvis. 2. Cholelithiasis. 3. Fat containing supraumbilical hernia. 4. Aortic Atherosclerosis (ICD10-I70.0). Electronically Signed   By: Logan Bores M.D.   On: 04/28/2021 11:35    Assessment/Plan:    1. Acute diarrhea Has resolved Felt to be infectious by GI  2. Hyponatremia Resolved, likely due to fluid loss  3. Partial symptomatic epilepsy with complex partial seizures, not intractable, without status epilepticus ([place]) Chronic for years with no new events  4. Mixed hyperlipidemia Continue Zocor   5. Abnormality of gait Reports a hx of neuropathy in her feet and uses a walker   7.  Other iron deficiency anemia Continue MVI with iron F/u CBC outpt  8. Essential hypertension Slightly elevated today but other numbers in matrix are WNL Continue to monitor. Has apt 12/21  9. Closed displaced comminuted fracture of shaft of right humerus with delayed healing No pain  Chronic in nature per pt   Patient is being discharged with the following home health services:  home care   Patient is being discharged with the following durable medical equipment:  has walker   Patient has been advised to f/u with their PCP in 1-2 weeks to bring them up to date on their rehab stay.  Social services at facility was responsible for arranging this appointment.  Pt was provided with a 30 day supply of prescriptions for medications and refills must be obtained from their PCP.  For controlled substances, a more limited supply may be provided adequate until PCP appointment only.  Future labs/tests needed:  BMP, CBC   Discharge review, assessment, plan, and coordination took >30 min

## 2021-05-16 DIAGNOSIS — M4807 Spinal stenosis, lumbosacral region: Secondary | ICD-10-CM | POA: Diagnosis not present

## 2021-05-16 DIAGNOSIS — M6281 Muscle weakness (generalized): Secondary | ICD-10-CM | POA: Diagnosis not present

## 2021-05-16 DIAGNOSIS — E871 Hypo-osmolality and hyponatremia: Secondary | ICD-10-CM | POA: Diagnosis not present

## 2021-05-17 ENCOUNTER — Encounter: Payer: Self-pay | Admitting: Adult Health

## 2021-05-17 DIAGNOSIS — E871 Hypo-osmolality and hyponatremia: Secondary | ICD-10-CM | POA: Diagnosis not present

## 2021-05-17 DIAGNOSIS — M4807 Spinal stenosis, lumbosacral region: Secondary | ICD-10-CM | POA: Diagnosis not present

## 2021-05-17 DIAGNOSIS — D539 Nutritional anemia, unspecified: Secondary | ICD-10-CM | POA: Insufficient documentation

## 2021-05-17 DIAGNOSIS — D649 Anemia, unspecified: Secondary | ICD-10-CM | POA: Insufficient documentation

## 2021-05-17 DIAGNOSIS — R2689 Other abnormalities of gait and mobility: Secondary | ICD-10-CM | POA: Diagnosis not present

## 2021-05-17 DIAGNOSIS — R278 Other lack of coordination: Secondary | ICD-10-CM | POA: Diagnosis not present

## 2021-05-18 DIAGNOSIS — M4807 Spinal stenosis, lumbosacral region: Secondary | ICD-10-CM | POA: Diagnosis not present

## 2021-05-18 DIAGNOSIS — E871 Hypo-osmolality and hyponatremia: Secondary | ICD-10-CM | POA: Diagnosis not present

## 2021-05-18 DIAGNOSIS — M6281 Muscle weakness (generalized): Secondary | ICD-10-CM | POA: Diagnosis not present

## 2021-05-19 DIAGNOSIS — M6281 Muscle weakness (generalized): Secondary | ICD-10-CM | POA: Diagnosis not present

## 2021-05-19 DIAGNOSIS — E871 Hypo-osmolality and hyponatremia: Secondary | ICD-10-CM | POA: Diagnosis not present

## 2021-05-19 DIAGNOSIS — M4807 Spinal stenosis, lumbosacral region: Secondary | ICD-10-CM | POA: Diagnosis not present

## 2021-05-19 LAB — URIC ACID: Uric Acid: 5.5

## 2021-05-23 DIAGNOSIS — E871 Hypo-osmolality and hyponatremia: Secondary | ICD-10-CM | POA: Diagnosis not present

## 2021-05-23 DIAGNOSIS — M6281 Muscle weakness (generalized): Secondary | ICD-10-CM | POA: Diagnosis not present

## 2021-05-23 DIAGNOSIS — M4807 Spinal stenosis, lumbosacral region: Secondary | ICD-10-CM | POA: Diagnosis not present

## 2021-05-24 ENCOUNTER — Encounter: Payer: Self-pay | Admitting: Gastroenterology

## 2021-05-24 DIAGNOSIS — D509 Iron deficiency anemia, unspecified: Secondary | ICD-10-CM | POA: Insufficient documentation

## 2021-05-24 DIAGNOSIS — E871 Hypo-osmolality and hyponatremia: Secondary | ICD-10-CM | POA: Diagnosis not present

## 2021-05-24 DIAGNOSIS — M4807 Spinal stenosis, lumbosacral region: Secondary | ICD-10-CM | POA: Diagnosis not present

## 2021-05-24 DIAGNOSIS — R2689 Other abnormalities of gait and mobility: Secondary | ICD-10-CM | POA: Diagnosis not present

## 2021-05-24 DIAGNOSIS — R7989 Other specified abnormal findings of blood chemistry: Secondary | ICD-10-CM | POA: Insufficient documentation

## 2021-05-24 DIAGNOSIS — R278 Other lack of coordination: Secondary | ICD-10-CM | POA: Diagnosis not present

## 2021-05-24 NOTE — Progress Notes (Signed)
Assessment and plan noted ?

## 2021-05-29 DIAGNOSIS — E871 Hypo-osmolality and hyponatremia: Secondary | ICD-10-CM | POA: Diagnosis not present

## 2021-05-29 DIAGNOSIS — M4807 Spinal stenosis, lumbosacral region: Secondary | ICD-10-CM | POA: Diagnosis not present

## 2021-05-29 DIAGNOSIS — M6281 Muscle weakness (generalized): Secondary | ICD-10-CM | POA: Diagnosis not present

## 2021-05-31 ENCOUNTER — Other Ambulatory Visit: Payer: Self-pay

## 2021-05-31 ENCOUNTER — Non-Acute Institutional Stay: Payer: Medicare Other | Admitting: Internal Medicine

## 2021-05-31 ENCOUNTER — Encounter: Payer: Self-pay | Admitting: Internal Medicine

## 2021-05-31 VITALS — BP 136/52 | HR 76 | Temp 97.9°F | Ht 66.0 in | Wt 155.2 lb

## 2021-05-31 DIAGNOSIS — E782 Mixed hyperlipidemia: Secondary | ICD-10-CM | POA: Diagnosis not present

## 2021-05-31 DIAGNOSIS — M6281 Muscle weakness (generalized): Secondary | ICD-10-CM | POA: Diagnosis not present

## 2021-05-31 DIAGNOSIS — N1832 Chronic kidney disease, stage 3b: Secondary | ICD-10-CM

## 2021-05-31 DIAGNOSIS — E871 Hypo-osmolality and hyponatremia: Secondary | ICD-10-CM

## 2021-05-31 DIAGNOSIS — M4807 Spinal stenosis, lumbosacral region: Secondary | ICD-10-CM | POA: Diagnosis not present

## 2021-05-31 DIAGNOSIS — M48061 Spinal stenosis, lumbar region without neurogenic claudication: Secondary | ICD-10-CM | POA: Diagnosis not present

## 2021-05-31 DIAGNOSIS — G629 Polyneuropathy, unspecified: Secondary | ICD-10-CM | POA: Diagnosis not present

## 2021-05-31 DIAGNOSIS — D649 Anemia, unspecified: Secondary | ICD-10-CM | POA: Diagnosis not present

## 2021-05-31 DIAGNOSIS — R197 Diarrhea, unspecified: Secondary | ICD-10-CM | POA: Diagnosis not present

## 2021-05-31 DIAGNOSIS — Z8669 Personal history of other diseases of the nervous system and sense organs: Secondary | ICD-10-CM

## 2021-05-31 DIAGNOSIS — I10 Essential (primary) hypertension: Secondary | ICD-10-CM | POA: Diagnosis not present

## 2021-05-31 NOTE — Progress Notes (Signed)
Location:  [place] of Service:  Clinic (12)  Provider:   Code Status:  Goals of Care:  Advanced Directives 05/31/2021  Does Patient Have a Medical Advance Directive? Yes  Type of Paramedic of [place];Out of facility DNR (pink MOST or yellow form);Living will  Does patient want to make changes to medical advance directive? No - Patient declined  Copy of [place] in Chart? No - copy requested     Chief Complaint  Patient presents with   Medical Management of Chronic Issues    Patient returns to the clinic for follow up from rehab. Patient states she would like to discuss releasing her records from [place],  her sodium level and neuropathy in her feet and legs. Patient states she has had her care gaps completed with [facility].     HPI: Patient is a 85 y.o. female seen today for medical management of chronic diseases.    Patient has a history of hypertension, spinal stenosis with back pain, hyperlipidemia,   history of complex partial epilepsy, Since she was in her Teens Had Work up in [place] and has been in dilantin since then  Admitted in the hospital from 10/31-11/04 for Hyonatremia due to Dehydration worsen by Diarrhea Followed by Rehab stay Patient was admitted in the 2/22 for similar episode of diarrhea and hyponatremia She says she got better after that but it took some time  Diarrhea Resolved CT was negative except except Gall stones with no inflammation Saw GI Possible Infectious No More work up right now  H/o Unhealed Right humerus Shaft Fracture Does not want to follow with Ortho Peripheral Neuropathy Bilateral Feet  Never worked up before  No Other issue today Walking with her walker Lives by herself in [place] apartment No Falls or dizziness. No Diarrhea or Abdominal Discomfort Has gained weight   Past Surgical History:  Procedure Laterality Date   APPENDECTOMY      BREAST MASS EXCISION     CARPAL TUNNEL RELEASE     REMOVAL OF PARATHYROID GLAND     STRESS NUCLEAR STUDY     ABNORMAL   TONSILLECTOMY      Allergies  Allergen Reactions   Azithromycin Other (See Comments)    Reaction not recalled   Penicillins Hives and Other (See Comments)    Bad reaction as a child after a shot of Penicillin   Pravastatin Other (See Comments)    Reaction not recalled   Sulfa Drugs Cross Reactors Other (See Comments)    Reaction not recalled   Naproxen Sodium Rash    Outpatient Encounter Medications as of 05/31/2021  Medication Sig   acetaminophen (TYLENOL) 500 MG tablet Take 500 mg by mouth every 6 (six) hours as needed for mild pain or headache.   B Complex Vitamins (VITAMIN-B COMPLEX) TABS Take 1 tablet by mouth daily.   carvedilol (COREG) 25 MG tablet Take 25 mg by mouth 2 (two) times daily with a meal.   fluticasone (FLONASE) 50 MCG/ACT nasal spray Place 1-2 sprays into both nostrils in the morning.   loperamide (IMODIUM A-D) 2 MG tablet Take 2 mg by mouth as needed for diarrhea or loose stools.   Multiple Vitamins-Minerals (MULTI COMPLETE/IRON) TABS Take 1 tablet by mouth daily with breakfast.   PHENobarbital (LUMINAL) 32.4 MG tablet Take 2 tablets (64.8 mg total) by mouth daily with breakfast.   phenytoin (DILANTIN) 100 MG ER capsule Take 200 mg by mouth  daily with breakfast.   sennosides-docusate sodium (SENOKOT-S) 8.6-50 MG tablet Take 1 tablet by mouth at bedtime.   simvastatin (ZOCOR) 20 MG tablet Take 20 mg by mouth at bedtime.   SYSTANE ULTRA PF 0.4-0.3 % SOLN Place 1 drop into both eyes in the morning and at bedtime.   [DISCONTINUED] ondansetron (ZOFRAN) 4 MG tablet Take 1 tablet (4 mg total) by mouth every 6 (six) hours as needed for nausea.   No facility-administered encounter medications on file as of 05/31/2021.    Review of Systems:  Review of Systems  Constitutional:  Negative for activity change and appetite change.  HENT: Negative.     Respiratory:  Negative for cough and shortness of breath.   Cardiovascular:  Negative for leg swelling.  Gastrointestinal:  Negative for constipation and diarrhea.  Genitourinary: Negative.   Musculoskeletal:  Negative for arthralgias, gait problem and myalgias.  Skin: Negative.   Neurological:  Positive for numbness. Negative for dizziness and weakness.  Psychiatric/Behavioral:  Negative for confusion, dysphoric mood and sleep disturbance.    Health Maintenance  Topic Date Due   TETANUS/TDAP  Never done   DEXA SCAN  Never done   Pneumonia Vaccine 45+ Years old (2 - PPSV23 if available, else PCV20) 11/26/2015   COVID-19 Vaccine (6 - Booster for Moderna series) 06/23/2021   INFLUENZA VACCINE  Completed   Zoster Vaccines- Shingrix  Completed   HPV VACCINES  Aged Out    Physical Exam: Vitals:   05/31/21 0945  BP: (!) 136/52  Pulse: 76  Temp: 97.9 F (36.6 C)  SpO2: 99%  Weight: 155 lb 3.2 oz (70.4 kg)  Height: 5\' 6"  (1.676 m)   Body mass index is 25.05 kg/m. Physical Exam Vitals reviewed.  Constitutional:      Appearance: Normal appearance.  HENT:     Head: Normocephalic.     Nose: Nose normal.     Mouth/Throat:     Mouth: Mucous membranes are moist.     Pharynx: Oropharynx is clear.  Eyes:     Pupils: Pupils are equal, round, and reactive to light.  Cardiovascular:     Rate and Rhythm: Normal rate and regular rhythm.     Pulses: Normal pulses.     Heart sounds: Normal heart sounds. No murmur heard. Pulmonary:     Effort: Pulmonary effort is normal.     Breath sounds: Normal breath sounds.  Abdominal:     General: Abdomen is flat. Bowel sounds are normal.     Palpations: Abdomen is soft.  Musculoskeletal:        General: No swelling.     Cervical back: Neck supple.  Skin:    General: Skin is warm.  Neurological:     General: No focal deficit present.     Mental Status: She is alert and oriented to person, place, and time.  Psychiatric:        Mood and  Affect: Mood normal.        Thought Content: Thought content normal.    Labs reviewed: Basic Metabolic Panel: Recent Labs    07/21/20 1745 07/21/20 2102 07/22/20 0804 07/22/20 1208 07/23/20 0442 07/24/20 0414 07/25/20 0203 08/03/20 0000 04/13/21 0341 04/14/21 0338 04/17/21 0000 04/25/21 0000 05/02/21 0000 05/04/21 0000 05/10/21 1113  NA 118*   < > 121*   < > 127*   < > 128*   < > 131* 131* 134*   134*   < > 137 138 140  K 3.0*   < >  3.7   < > 3.1*   < > 3.5   < > 4.1 3.8 4.0   4.0   < > 3.9 4.2 4.1  CL 83*   < > 90*   < > 95*   < > 99   < > 99 98 96*   96*   < > 98* 100 102  CO2 24   < > 23   < > 23   < > 20*   < > 26 25 26*   26*   < > 25* 26* 28  GLUCOSE 150*   < > 124*   < > 116*   < > 114*   < > 107* 111*  --   --   --   --  100*  BUN 9   < > 7*   < > 8   < > 23   < > 25* 28* 24*   24*   < > 29* 33* 32*  CREATININE 0.83   < > 0.71   < > 0.85   < > 1.09*   < > 0.80 0.87 0.9   0.9   < > 1.4* 1.3* 1.31*  CALCIUM 7.6*   < > 7.6*   < > 7.6*   < > 7.8*   < > 8.4* 8.4* 8.9   8.9   < > 9.3 9.2 8.6  MG  --   --  1.6*  --  1.4*  --  1.7   < > 1.8 1.4* 1.7  --   --   --   --   PHOS  --   --  2.3*  --  2.6  --  3.4  --   --   --   --   --   --   --   --   TSH 1.685  --   --   --   --   --   --   --   --   --   --   --   --   --   --    < > = values in this interval not displayed.   Liver Function Tests: Recent Labs    07/21/20 1745 04/10/21 1027 04/25/21 0000 05/02/21 0000 05/10/21 1113  AST 31 20 74* 26 19  ALT 16 11 61* 23 12  ALKPHOS 56 68 66 67 47  BILITOT 1.0 0.4  --   --  0.3  PROT 6.6 6.9  --   --  6.7  ALBUMIN 3.5 3.8 3.1* 3.5 3.7   Recent Labs    04/10/21 1027  LIPASE 61*   No results for input(s): AMMONIA in the last 8760 hours. CBC: Recent Labs    04/13/21 0341 04/14/21 0338 04/17/21 0000 04/25/21 0000 05/02/21 0000 05/10/21 1113  WBC 8.1 7.3   < > 7.1 5.4 5.3  NEUTROABS 5.9 5.0  --   --   --  3.8  HGB 8.7* 8.6*   < > 9.3* 9.8* 9.5*  HCT 25.7*  24.8*   < > 26* 30* 27.6*  MCV 98.8 97.3  --   --   --  97.9  PLT 188 190   < > 389 341 249.0   < > = values in this interval not displayed.   Lipid Panel: No results for input(s): CHOL, HDL, LDLCALC, TRIG, CHOLHDL, LDLDIRECT in the last 8760 hours. No results found for: HGBA1C  Procedures since last visit: No results  found.  Assessment/Plan 1. Hyponatremia Sodium stable She wants it checked in few months Will order labs in 2 months 2. Diarrhea, unspecified type Resolved Gi thinks it is due to Infectious process  3. Neuropathy Check B 12 level Refuses to see Neurologist right now  4. Mixed hyperlipidemia On Statin  5. Essential hypertension On Coreg  6. Anemia, unspecified type ? Due to CKD  [employer] iron [employer]  7. Stage 3b chronic kidney disease (HCC) Creat stable  8. Spinal stenosis of lumbar region, unspecified whether neurogenic claudication present Was suppose to get further work up with MRI Will follow next visit 9Partial symptomatic epilepsy with complex partial seizures, not intractable, without status epilepticus ([place]) On Pheynytoin and Phenobarb Per patient she had work up in [place] and does not need follow up right now with Neurology  Patient is going to talk to Dr Reynaldo Minium before changing her PCP Will call our office for follow up  Labs/tests ordered:  CBC,CMP,LIPid B 12 level Next appt:  Visit date not found

## 2021-05-31 NOTE — Patient Instructions (Signed)
Call us when you are sure to follow with [place] for Appointment Nira Conn will call you for Blood work appointment in Jan. Check Vit B 12 supplements

## 2021-06-01 DIAGNOSIS — M6281 Muscle weakness (generalized): Secondary | ICD-10-CM | POA: Diagnosis not present

## 2021-06-01 DIAGNOSIS — E871 Hypo-osmolality and hyponatremia: Secondary | ICD-10-CM | POA: Diagnosis not present

## 2021-06-01 DIAGNOSIS — M4807 Spinal stenosis, lumbosacral region: Secondary | ICD-10-CM | POA: Diagnosis not present

## 2021-06-20 ENCOUNTER — Telehealth: Payer: Self-pay | Admitting: *Deleted

## 2021-06-20 NOTE — Telephone Encounter (Signed)
LMOM for Autumn to return call.

## 2021-06-20 NOTE — Telephone Encounter (Signed)
Spoke with Autumn and scheduled an appointment for in office with [place] 06/21/2020. [facility] will transport.

## 2021-06-20 NOTE — Telephone Encounter (Signed)
Autumn, [place] [facility] Nurse, Called and Left message on Clinical intake stating that patient needs an appointment with Dr. Lyndel Safe or Cindi Carbon.   Stated that patient is starting to have diarrhea again and has a history of Hyponatremia. Wanting to schedule an appointment ASAP at [facility] to be seen.   Tried calling Autumn and LMOM to return call.

## 2021-06-21 ENCOUNTER — Ambulatory Visit (INDEPENDENT_AMBULATORY_CARE_PROVIDER_SITE_OTHER): Payer: Medicare Other | Admitting: Family

## 2021-06-21 ENCOUNTER — Other Ambulatory Visit: Payer: Self-pay

## 2021-06-21 ENCOUNTER — Encounter: Payer: Self-pay | Admitting: Family

## 2021-06-21 VITALS — BP 160/80 | HR 115 | Temp 97.2°F | Resp 16 | Ht 66.0 in | Wt 143.2 lb

## 2021-06-21 DIAGNOSIS — R197 Diarrhea, unspecified: Secondary | ICD-10-CM | POA: Diagnosis not present

## 2021-06-21 LAB — BASIC METABOLIC PANEL WITH GFR
BUN/Creatinine Ratio: 17 (calc) (ref 6–22)
BUN: 21 mg/dL (ref 7–25)
CO2: 27 mmol/L (ref 20–32)
Calcium: 9.4 mg/dL (ref 8.6–10.4)
Chloride: 98 mmol/L (ref 98–110)
Creat: 1.26 mg/dL — ABNORMAL HIGH (ref 0.60–0.95)
Glucose, Bld: 77 mg/dL (ref 65–99)
Potassium: 4.4 mmol/L (ref 3.5–5.3)
Sodium: 137 mmol/L (ref 135–146)
eGFR: 42 mL/min/{1.73_m2} — ABNORMAL LOW (ref >=60)

## 2021-06-21 LAB — CBC WITH DIFFERENTIAL/PLATELET
Absolute Monocytes: 788 cells/uL (ref 200–950)
Basophils Absolute: 31 cells/uL (ref 0–200)
Basophils Relative: 0.4 %
Eosinophils Absolute: 0 cells/uL — ABNORMAL LOW (ref 15–500)
Eosinophils Relative: 0 %
HCT: 30.9 % — ABNORMAL LOW (ref 35.0–45.0)
Hemoglobin: 10.6 g/dL — ABNORMAL LOW (ref 11.7–15.5)
Lymphs Abs: 1295 cells/uL (ref 850–3900)
MCH: 33.2 pg — ABNORMAL HIGH (ref 27.0–33.0)
MCHC: 34.3 g/dL (ref 32.0–36.0)
MCV: 96.9 fL (ref 80.0–100.0)
MPV: 9.3 fL (ref 7.5–12.5)
Monocytes Relative: 10.1 %
Neutro Abs: 5686 cells/uL (ref 1500–7800)
Neutrophils Relative %: 72.9 %
Platelets: 288 10*3/uL (ref 140–400)
RBC: 3.19 10*6/uL — ABNORMAL LOW (ref 3.80–5.10)
RDW: 12.5 % (ref 11.0–15.0)
Total Lymphocyte: 16.6 %
WBC: 7.8 10*3/uL (ref 3.8–10.8)

## 2021-06-21 MED ORDER — LOPERAMIDE HCL 2 MG PO TABS: 2.0000 mg | ORAL_TABLET | ORAL | 3 refills | Status: DC | PRN

## 2021-06-21 MED ORDER — SENNA-DOCUSATE SODIUM 8.6-50 MG PO TABS: 1.0000 | ORAL_TABLET | Freq: Every day | ORAL | 0 refills | Status: AC | PRN

## 2021-06-21 NOTE — Progress Notes (Signed)
Provider: Terilynn Buresh FNP-C  Virgie Dad, MD  Patient Care Team: Virgie Dad, MD as PCP - General (Internal Medicine)  Extended Emergency Contact Information Primary Emergency Contact: Ney Mobile Phone: [phone] Relation: Daughter Secondary Emergency Contact: PEJA, ALLENDER Mobile Phone: [phone] Relation: Son  Code Status:  Full Code  Goals of care: Advanced Directive information Advanced Directives 06/21/2021  Does Patient Have a Medical Advance Directive? Yes  Type of Paramedic of [place];Living will;Out of facility DNR (pink MOST or yellow form)  Does patient want to make changes to medical advance directive? No - Patient declined  Copy of [place] in Chart? No - copy requested     Chief Complaint  Patient presents with   Acute Visit    Patient complains of Diarrhea for 4 days. Patient has history of Hyponatremia.     HPI:  Pt is a 86 y.o. female seen today for an acute visit for evaluation of diarrhea x 4-5 days.described as watery.Has had 2 times of diarrhea today.No bloody stool. No recent use of antibiotics.Has not eaten any food today.drunk Gatorade.Has not been hungry.  She denies any fever,chills,constipation,Nausea ,vomiting,cramping or abdominal pain.Has Senokot on med list but states not taking senokot request med to be discontinued.I've discuss with her that she might need so recommend changing to PRN to which she agrees.    Blood pressure high on arrival but rechecked improved.Has not taken blood pressure medication.states does not take medication if she does not eat.   Past Medical History:  Diagnosis Date   Abnormal cardiovascular stress test    Carotid arterial disease (HCC)    MODERATE RIGHT   Cerebral AV malformation    DDD (degenerative disc disease)    SEVERE WITH SPINAL STENOSIS   Hypercholesterolemia    Hypertension    MVP (mitral valve prolapse)    MILD   Numbness     LEFT FOOT AND ANKLE   Osteopenia    Pseudo-gout    Past Surgical History:  Procedure Laterality Date   APPENDECTOMY     BREAST MASS EXCISION     CARPAL TUNNEL RELEASE     REMOVAL OF PARATHYROID GLAND     STRESS NUCLEAR STUDY     ABNORMAL   TONSILLECTOMY      Allergies  Allergen Reactions   Azithromycin Other (See Comments)    Reaction not recalled   Penicillins Hives and Other (See Comments)    Bad reaction as a child after a shot of Penicillin   Pravastatin Other (See Comments)    Reaction not recalled   Sulfa Drugs Cross Reactors Other (See Comments)    Reaction not recalled   Naproxen Sodium Rash    Outpatient Encounter Medications as of 06/21/2021  Medication Sig   acetaminophen (TYLENOL) 500 MG tablet Take 500 mg by mouth every 6 (six) hours as needed for mild pain or headache.   B Complex Vitamins (VITAMIN-B COMPLEX) TABS Take 1 tablet by mouth daily.   carvedilol (COREG) 25 MG tablet Take 25 mg by mouth 2 (two) times daily with a meal.   fluticasone (FLONASE) 50 MCG/ACT nasal spray Place 1-2 sprays into both nostrils in the morning.   Multiple Vitamins-Minerals (MULTI COMPLETE/IRON) TABS Take 1 tablet by mouth daily with breakfast.   PHENobarbital (LUMINAL) 32.4 MG tablet Take 2 tablets (64.8 mg total) by mouth daily with breakfast.   phenytoin (DILANTIN) 100 MG ER capsule Take 200 mg by mouth daily with  breakfast.   simvastatin (ZOCOR) 20 MG tablet Take 20 mg by mouth at bedtime.   SYSTANE ULTRA PF 0.4-0.3 % SOLN Place 1 drop into both eyes in the morning and at bedtime.   [DISCONTINUED] loperamide (IMODIUM A-D) 2 MG tablet Take 2 mg by mouth as needed for diarrhea or loose stools.   loperamide (IMODIUM A-D) 2 MG tablet Take 1 tablet (2 mg total) by mouth as needed for diarrhea or loose stools.   sennosides-docusate sodium (SENOKOT-S) 8.6-50 MG tablet Take 1 tablet by mouth daily as needed for constipation.   [DISCONTINUED] sennosides-docusate sodium (SENOKOT-S)  8.6-50 MG tablet Take 1 tablet by mouth at bedtime. (Patient not taking: Reported on 06/21/2021)   No facility-administered encounter medications on file as of 06/21/2021.    Review of Systems  Constitutional:  Positive for appetite change. Negative for chills, fatigue, fever and unexpected weight change.  HENT:  Negative for congestion, dental problem, ear discharge, ear pain, facial swelling, hearing loss, nosebleeds, postnasal drip, rhinorrhea, sinus pressure, sinus pain, sneezing, sore throat, tinnitus and trouble swallowing.   Eyes:  Negative for pain, discharge, redness, itching and visual disturbance.  Respiratory:  Negative for cough, chest tightness, shortness of breath and wheezing.   Cardiovascular:  Negative for chest pain, palpitations and leg swelling.  Gastrointestinal:  Positive for diarrhea. Negative for abdominal distention, abdominal pain, blood in stool, constipation, nausea and vomiting.  Musculoskeletal:  Positive for gait problem. Negative for arthralgias, back pain, joint swelling, myalgias, neck pain and neck stiffness.  Skin:  Negative for color change, pallor and rash.  Neurological:  Negative for dizziness, syncope, speech difficulty, weakness, light-headedness, numbness and headaches.  Psychiatric/Behavioral:  Negative for agitation, behavioral problems, confusion, hallucinations, self-injury, sleep disturbance and suicidal ideas. The patient is not nervous/anxious.    Immunization History  Administered Date(s) Administered   Influenza Split 03/28/2009, 03/17/2010, 03/26/2011, 04/01/2012, 03/05/2013   Influenza, Quadrivalent, Recombinant, Inj, Pf 03/08/2018, 02/13/2019, 02/24/2020   Influenza,inj,Quad PF,6+ Mos 02/25/2014, 03/05/2015   Influenza-Unspecified 02/09/2014, 02/25/2016, 04/06/2017, 04/25/2021   Moderna Sars-Covid-2 Vaccination 07/17/2019, 08/14/2019, 02/24/2020, 04/26/2020   Pneumococcal Conjugate-13 11/26/2014   Pneumococcal-Unspecified 06/23/2003,  11/10/2012   Td (Adult), 2 Lf Tetanus Toxid, Preservative Free 06/11/2000, 11/06/2011   Unspecified SARS-COV-2 Vaccination 04/28/2021   Zoster Recombinat (Shingrix) 01/09/2019, 05/08/2019   Zoster, Live 01/15/2006, 01/15/2019   Pertinent  Health Maintenance Due  Topic Date Due   DEXA SCAN  Never done   INFLUENZA VACCINE  Completed   Fall Risk 04/13/2021 04/13/2021 04/14/2021 05/31/2021 06/21/2021  Falls in the past year? - - - 1 1  Was there an injury with Fall? - - - 0 0  Fall Risk Category Calculator - - - 1 1  Fall Risk Category - - - Low Low  Patient Fall Risk Level High fall risk High fall risk High fall risk Low fall risk Low fall risk  Patient at Risk for Falls Due to - - - Impaired mobility Impaired mobility  Fall risk Follow up - - - Falls evaluation completed Falls evaluation completed;Education provided;Falls prevention discussed   Functional Status Survey:    Vitals:   06/21/21 1343 06/21/21 1426  BP: (!) 200/80 (!) 160/80  Pulse: (!) 115   Resp: 16   Temp: (!) 97.2 F (36.2 C)   SpO2: 97%   Weight: 143 lb 3.2 oz (65 kg)   Height: 5' 6"  (1.676 m)    Body mass index is 23.11 kg/m. Physical Exam Vitals reviewed.  Constitutional:  General: She is not in acute distress.    Appearance: Normal appearance. She is normal weight. She is not ill-appearing or diaphoretic.  HENT:     Head: Normocephalic.  Eyes:     General: No scleral icterus.       Right eye: No discharge.        Left eye: No discharge.     Conjunctiva/sclera: Conjunctivae normal.     Pupils: Pupils are equal, round, and reactive to light.  Cardiovascular:     Rate and Rhythm: Normal rate and regular rhythm.     Pulses: Normal pulses.     Heart sounds: Normal heart sounds. No murmur heard.   No friction rub. No gallop.  Pulmonary:     Effort: Pulmonary effort is normal. No respiratory distress.     Breath sounds: Normal breath sounds. No wheezing, rhonchi or rales.  Chest:     Chest wall: No  tenderness.  Abdominal:     General: Bowel sounds are normal. There is no distension.     Palpations: Abdomen is soft. There is no mass.     Tenderness: There is no abdominal tenderness. There is no right CVA tenderness, left CVA tenderness, guarding or rebound.  Musculoskeletal:        General: No swelling or tenderness. Normal range of motion.     Cervical back: Normal range of motion. No rigidity or tenderness.     Right lower leg: No edema.     Left lower leg: No edema.     Comments: Unsteady gait walks with a walker   Lymphadenopathy:     Cervical: No cervical adenopathy.  Skin:    General: Skin is warm and dry.     Coloration: Skin is not pale.     Findings: No bruising, erythema, lesion or rash.  Neurological:     Mental Status: She is alert and oriented to person, place, and time.     Motor: No weakness.     Gait: Gait abnormal.  Psychiatric:        Mood and Affect: Mood normal.        Speech: Speech normal.        Behavior: Behavior normal.    Labs reviewed: Recent Labs    07/22/20 0804 07/22/20 1208 07/23/20 0442 07/24/20 0414 07/25/20 0203 08/03/20 0000 04/13/21 0341 04/14/21 0338 04/17/21 0000 04/25/21 0000 05/02/21 0000 05/04/21 0000 05/10/21 1113  NA 121*   < > 127*   < > 128*   < > 131* 131* 134*   134*   < > 137 138 140  K 3.7   < > 3.1*   < > 3.5   < > 4.1 3.8 4.0   4.0   < > 3.9 4.2 4.1  CL 90*   < > 95*   < > 99   < > 99 98 96*   96*   < > 98* 100 102  CO2 23   < > 23   < > 20*   < > 26 25 26*   26*   < > 25* 26* 28  GLUCOSE 124*   < > 116*   < > 114*   < > 107* 111*  --   --   --   --  100*  BUN 7*   < > 8   < > 23   < > 25* 28* 24*   24*   < > 29* 33* 32*  CREATININE 0.71   < >  0.85   < > 1.09*   < > 0.80 0.87 0.9   0.9   < > 1.4* 1.3* 1.31*  CALCIUM 7.6*   < > 7.6*   < > 7.8*   < > 8.4* 8.4* 8.9   8.9   < > 9.3 9.2 8.6  MG 1.6*  --  1.4*  --  1.7   < > 1.8 1.4* 1.7  --   --   --   --   PHOS 2.3*  --  2.6  --  3.4  --   --   --   --   --   --    --   --    < > = values in this interval not displayed.   Recent Labs    07/21/20 1745 04/10/21 1027 04/25/21 0000 05/02/21 0000 05/10/21 1113  AST 31 20 74* 26 19  ALT 16 11 61* 23 12  ALKPHOS 56 68 66 67 47  BILITOT 1.0 0.4  --   --  0.3  PROT 6.6 6.9  --   --  6.7  ALBUMIN 3.5 3.8 3.1* 3.5 3.7   Recent Labs    04/13/21 0341 04/14/21 0338 04/17/21 0000 04/25/21 0000 05/02/21 0000 05/10/21 1113  WBC 8.1 7.3   < > 7.1 5.4 5.3  NEUTROABS 5.9 5.0  --   --   --  3.8  HGB 8.7* 8.6*   < > 9.3* 9.8* 9.5*  HCT 25.7* 24.8*   < > 26* 30* 27.6*  MCV 98.8 97.3  --   --   --  97.9  PLT 188 190   < > 389 341 249.0   < > = values in this interval not displayed.   Lab Results  Component Value Date   TSH 1.685 07/21/2020   No results found for: HGBA1C No results found for: CHOL, HDL, LDLCALC, LDLDIRECT, TRIG, CHOLHDL  Significant Diagnostic Results in last 30 days:  No results found.  Assessment/Plan  Diarrhea, unspecified type Afebrile  Reports watery diarrhea x 4-5 days  - advance diet as tolerated  - Imodium as needed  - CBC with Differential/Platelet - BMP with eGFR([facility]) - Stool culture - additional education information on diarrhea provided on AVS  - stool specimen bottles given to collect specimen and give to facility Nurse to send to labs   Family/ staff Communication: Reviewed plan of care with patient verbalized understanding   Labs/tests ordered:  CBC/diff BMP Stool culture   Next Appointment: As needed if symptoms worsen or fail to improve    Sandrea Hughs, NP

## 2021-06-21 NOTE — Patient Instructions (Signed)
-   Please get stool specimen and give to facility Nurse to send to lab  - Advance diet as tolerated  - continue to drink Gatorade  - Notify provider if symptoms worsen or fail to improve  Bland Diet A bland diet consists of foods that are often soft and do not have a lot of fat, fiber, or extra seasonings. Foods without fat, fiber, or seasoning are easier for the body to digest. They are also less likely to irritate your mouth, throat, stomach, and other parts of your digestive system. A bland diet is sometimes called a BRAT diet. What is my plan? Your health care provider or food and nutrition specialist (dietitian) may recommend specific changes to your diet to prevent symptoms or to treat your symptoms. These changes may include: Eating small meals often. Cooking food until it is soft enough to chew easily. Chewing your food well. Drinking fluids slowly. Not eating foods that are very spicy, sour, or fatty. Not eating citrus fruits, such as oranges and grapefruit. What do I need to know about this diet? Eat a variety of foods from the bland diet food list. Do not follow a bland diet longer than needed. Ask your health care provider whether you should take vitamins or supplements. What foods can I eat? Grains Hot cereals, such as cream of wheat. Rice. Bread, crackers, or tortillas made from refined white flour. Vegetables Canned or cooked vegetables. Mashed or boiled potatoes. Fruits Bananas. Applesauce. Other types of cooked or canned fruit with the skin and seeds removed, such as canned peaches or pears. Meats and other proteins Scrambled eggs. Creamy peanut butter or other nut butters. Lean, well-cooked meats, such as chicken or fish. Tofu. Soups or broths. Dairy Low-fat dairy products, such as milk, cottage cheese, or yogurt. Beverages Water. Herbal tea. Apple juice. Fats and oils Mild salad dressings. Canola or olive oil. Sweets and desserts Pudding. Custard. Fruit gelatin.  Ice cream. The items listed above may not be a complete list of recommended foods and beverages. Contact a dietitian for more options. What foods are not recommended? Grains Whole grain breads and cereals. Vegetables Raw vegetables. Fruits Raw fruits, especially citrus, berries, or dried fruits. Dairy Whole fat dairy foods. Beverages Caffeinated drinks. Alcohol. Seasonings and condiments Strongly flavored seasonings or condiments. Hot sauce. Salsa. Other foods Spicy foods. Fried foods. Sour foods, such as pickled or fermented foods. Foods with high sugar content. Foods high in fiber. The items listed above may not be a complete list of foods and beverages to avoid. Contact a dietitian for more information. Summary A bland diet consists of foods that are often soft and do not have a lot of fat, fiber, or extra seasonings. Foods without fat, fiber, or seasoning are easier for the body to digest. Check with your health care provider to see how long you should follow this diet plan. It is not meant to be followed for long periods. This information is not intended to replace advice given to you by your health care provider. Make sure you discuss any questions you have with your health care provider. Document Revised: 06/26/2017 Document Reviewed: 06/26/2017 Elsevier Patient Education  2022 [employer].

## 2021-06-22 ENCOUNTER — Telehealth: Payer: Self-pay

## 2021-06-22 NOTE — Telephone Encounter (Signed)
Incoming call received from patient stating she is confused about the specimen sample she was told to collect yesterday.  Patient states she was given two containers and she was under the impression that she is to collect a urine specimen in one and a stool specimen in the other. Patient has collected a urine specimen in the container with the top.  I spoke with the [facility] lab tech, Edwena Blow and patient was to use the hat  (container #1) to collect a stool specimen and transfer some of the collection to the container #2 with the top.  I informed patient of the above and she is aware that she will need to stop by to get another container as the one she has is contaminated with the urine specimen, in which we do not need.  Supplies were placed at the front desk for pick-up.

## 2021-06-22 NOTE — Telephone Encounter (Signed)
Noted  

## 2021-06-23 ENCOUNTER — Other Ambulatory Visit: Payer: Self-pay | Admitting: Family

## 2021-06-23 DIAGNOSIS — R197 Diarrhea, unspecified: Secondary | ICD-10-CM

## 2021-06-23 NOTE — Addendum Note (Signed)
Addended by: Heriberto Antigua E on: 06/23/2021 11:57 AM   Modules accepted: Orders

## 2021-06-26 LAB — SALMONELLA/SHIGELLA CULT, CAMPY EIA AND SHIGA TOXIN RFL ECOLI
MICRO NUMBER: 12869811
MICRO NUMBER:: 12869812
MICRO NUMBER:: 12869813
Result:: NOT DETECTED
SHIGA RESULT:: NOT DETECTED
SPECIMEN QUALITY: ADEQUATE
SPECIMEN QUALITY:: ADEQUATE
SPECIMEN QUALITY:: ADEQUATE

## 2021-10-08 DIAGNOSIS — I10 Essential (primary) hypertension: Secondary | ICD-10-CM | POA: Diagnosis not present

## 2021-10-08 DIAGNOSIS — M1990 Unspecified osteoarthritis, unspecified site: Secondary | ICD-10-CM | POA: Diagnosis not present

## 2021-10-08 DIAGNOSIS — E785 Hyperlipidemia, unspecified: Secondary | ICD-10-CM | POA: Diagnosis not present

## 2021-10-15 ENCOUNTER — Emergency Department (HOSPITAL_COMMUNITY): Payer: Medicare Other

## 2021-10-15 ENCOUNTER — Encounter (HOSPITAL_COMMUNITY): Payer: Self-pay

## 2021-10-15 ENCOUNTER — Inpatient Hospital Stay (HOSPITAL_COMMUNITY)
Admission: EM | Admit: 2021-10-15 | Discharge: 2021-10-17 | DRG: 312 | Disposition: A | Discharge status: Home / Self Care | Payer: Medicare Other | Attending: Internal Medicine | Admitting: Internal Medicine

## 2021-10-15 DIAGNOSIS — Z801 Family history of malignant neoplasm of trachea, bronchus and lung: Secondary | ICD-10-CM

## 2021-10-15 DIAGNOSIS — Z66 Do not resuscitate: Secondary | ICD-10-CM | POA: Diagnosis present

## 2021-10-15 DIAGNOSIS — M4802 Spinal stenosis, cervical region: Secondary | ICD-10-CM | POA: Diagnosis present

## 2021-10-15 DIAGNOSIS — I5022 Chronic systolic (congestive) heart failure: Secondary | ICD-10-CM | POA: Diagnosis present

## 2021-10-15 DIAGNOSIS — M47812 Spondylosis without myelopathy or radiculopathy, cervical region: Secondary | ICD-10-CM | POA: Diagnosis not present

## 2021-10-15 DIAGNOSIS — M4692 Unspecified inflammatory spondylopathy, cervical region: Secondary | ICD-10-CM | POA: Diagnosis not present

## 2021-10-15 DIAGNOSIS — I2510 Atherosclerotic heart disease of native coronary artery without angina pectoris: Secondary | ICD-10-CM | POA: Diagnosis present

## 2021-10-15 DIAGNOSIS — I499 Cardiac arrhythmia, unspecified: Secondary | ICD-10-CM | POA: Diagnosis not present

## 2021-10-15 DIAGNOSIS — I130 Hypertensive heart and chronic kidney disease with heart failure and stage 1 through stage 4 chronic kidney disease, or unspecified chronic kidney disease: Secondary | ICD-10-CM | POA: Diagnosis present

## 2021-10-15 DIAGNOSIS — N189 Chronic kidney disease, unspecified: Secondary | ICD-10-CM | POA: Diagnosis not present

## 2021-10-15 DIAGNOSIS — I959 Hypotension, unspecified: Secondary | ICD-10-CM | POA: Diagnosis not present

## 2021-10-15 DIAGNOSIS — Z87891 Personal history of nicotine dependence: Secondary | ICD-10-CM

## 2021-10-15 DIAGNOSIS — D509 Iron deficiency anemia, unspecified: Secondary | ICD-10-CM | POA: Diagnosis present

## 2021-10-15 DIAGNOSIS — I429 Cardiomyopathy, unspecified: Secondary | ICD-10-CM | POA: Diagnosis present

## 2021-10-15 DIAGNOSIS — N1832 Chronic kidney disease, stage 3b: Secondary | ICD-10-CM | POA: Diagnosis present

## 2021-10-15 DIAGNOSIS — E7800 Pure hypercholesterolemia, unspecified: Secondary | ICD-10-CM | POA: Diagnosis present

## 2021-10-15 DIAGNOSIS — M40202 Unspecified kyphosis, cervical region: Secondary | ICD-10-CM | POA: Diagnosis not present

## 2021-10-15 DIAGNOSIS — R55 Syncope and collapse: Principal | ICD-10-CM | POA: Diagnosis present

## 2021-10-15 DIAGNOSIS — R569 Unspecified convulsions: Secondary | ICD-10-CM | POA: Diagnosis not present

## 2021-10-15 DIAGNOSIS — I10 Essential (primary) hypertension: Secondary | ICD-10-CM | POA: Diagnosis not present

## 2021-10-15 DIAGNOSIS — R001 Bradycardia, unspecified: Secondary | ICD-10-CM | POA: Diagnosis not present

## 2021-10-15 DIAGNOSIS — I4510 Unspecified right bundle-branch block: Secondary | ICD-10-CM | POA: Diagnosis present

## 2021-10-15 DIAGNOSIS — I493 Ventricular premature depolarization: Secondary | ICD-10-CM | POA: Diagnosis not present

## 2021-10-15 DIAGNOSIS — S199XXA Unspecified injury of neck, initial encounter: Secondary | ICD-10-CM | POA: Diagnosis not present

## 2021-10-15 DIAGNOSIS — Q282 Arteriovenous malformation of cerebral vessels: Secondary | ICD-10-CM | POA: Diagnosis not present

## 2021-10-15 DIAGNOSIS — I491 Atrial premature depolarization: Secondary | ICD-10-CM | POA: Diagnosis not present

## 2021-10-15 DIAGNOSIS — Z823 Family history of stroke: Secondary | ICD-10-CM

## 2021-10-15 DIAGNOSIS — E785 Hyperlipidemia, unspecified: Secondary | ICD-10-CM | POA: Diagnosis not present

## 2021-10-15 DIAGNOSIS — N179 Acute kidney failure, unspecified: Secondary | ICD-10-CM | POA: Diagnosis not present

## 2021-10-15 DIAGNOSIS — Z79899 Other long term (current) drug therapy: Secondary | ICD-10-CM

## 2021-10-15 DIAGNOSIS — I6523 Occlusion and stenosis of bilateral carotid arteries: Secondary | ICD-10-CM | POA: Diagnosis not present

## 2021-10-15 LAB — COMPREHENSIVE METABOLIC PANEL
ALT: 33 U/L (ref 0–44)
AST: 38 U/L (ref 15–41)
Albumin: 3.4 g/dL — ABNORMAL LOW (ref 3.5–5.0)
Alkaline Phosphatase: 61 U/L (ref 38–126)
Anion gap: 9 (ref 5–15)
BUN: 35 mg/dL — ABNORMAL HIGH (ref 8–23)
CO2: 24 mmol/L (ref 22–32)
Calcium: 9.1 mg/dL (ref 8.9–10.3)
Chloride: 108 mmol/L (ref 98–111)
Creatinine, Ser: 1.41 mg/dL — ABNORMAL HIGH (ref 0.44–1.00)
GFR, Estimated: 37 mL/min — ABNORMAL LOW (ref >60)
Glucose, Bld: 122 mg/dL — ABNORMAL HIGH (ref 70–99)
Potassium: 4.6 mmol/L (ref 3.5–5.1)
Sodium: 141 mmol/L (ref 135–145)
Total Bilirubin: 0.6 mg/dL (ref 0.3–1.2)
Total Protein: 6.2 g/dL — ABNORMAL LOW (ref 6.5–8.1)

## 2021-10-15 LAB — CBC WITH DIFFERENTIAL/PLATELET
Abs Immature Granulocytes: 0.01 10*3/uL (ref 0.00–0.07)
Basophils Absolute: 0 10*3/uL (ref 0.0–0.1)
Basophils Relative: 1 %
Eosinophils Absolute: 0 10*3/uL (ref 0.0–0.5)
Eosinophils Relative: 0 %
HCT: 28.1 % — ABNORMAL LOW (ref 36.0–46.0)
Hemoglobin: 9.1 g/dL — ABNORMAL LOW (ref 12.0–15.0)
Immature Granulocytes: 0 %
Lymphocytes Relative: 13 %
Lymphs Abs: 0.7 10*3/uL (ref 0.7–4.0)
MCH: 34 pg (ref 26.0–34.0)
MCHC: 32.4 g/dL (ref 30.0–36.0)
MCV: 104.9 fL — ABNORMAL HIGH (ref 80.0–100.0)
Monocytes Absolute: 0.7 10*3/uL (ref 0.1–1.0)
Monocytes Relative: 12 %
Neutro Abs: 4.2 10*3/uL (ref 1.7–7.7)
Neutrophils Relative %: 74 %
Platelets: 172 10*3/uL (ref 150–400)
RBC: 2.68 MIL/uL — ABNORMAL LOW (ref 3.87–5.11)
RDW: 13.3 % (ref 11.5–15.5)
WBC: 5.6 10*3/uL (ref 4.0–10.5)
nRBC: 0 % (ref 0.0–0.2)

## 2021-10-15 LAB — TROPONIN I (HIGH SENSITIVITY)
Troponin I (High Sensitivity): 20 ng/L — ABNORMAL HIGH (ref <18)
Troponin I (High Sensitivity): 18 ng/L — ABNORMAL HIGH (ref <18)

## 2021-10-15 LAB — PHENYTOIN LEVEL, TOTAL: Phenytoin Lvl: 7.9 ug/mL — ABNORMAL LOW (ref 10.0–20.0)

## 2021-10-15 LAB — LIPASE, BLOOD: Lipase: 54 U/L — ABNORMAL HIGH (ref 11–51)

## 2021-10-15 LAB — PHENOBARBITAL LEVEL: Phenobarbital: 21.1 ug/mL (ref 15.0–40.0)

## 2021-10-15 MED ORDER — B COMPLEX-C PO TABS
1.0000 | ORAL_TABLET | Freq: Every day | ORAL | Status: DC
  Administered 2021-10-16 – 2021-10-17 (×2): 1 via ORAL
  Filled 2021-10-15 (×2): qty 1

## 2021-10-15 MED ORDER — ACETAMINOPHEN 500 MG PO TABS: 500.0000 mg | ORAL_TABLET | Freq: Four times a day (QID) | ORAL | Status: DC | PRN

## 2021-10-15 MED ORDER — LOPERAMIDE HCL 2 MG PO CAPS: 2.0000 mg | ORAL_CAPSULE | ORAL | Status: DC | PRN

## 2021-10-15 MED ORDER — ENOXAPARIN SODIUM 30 MG/0.3ML IJ SOSY
30.0000 mg | PREFILLED_SYRINGE | INTRAMUSCULAR | Status: DC
  Administered 2021-10-15 – 2021-10-16 (×2): 30 mg via SUBCUTANEOUS
  Filled 2021-10-15 (×2): qty 0.3

## 2021-10-15 MED ORDER — LOPERAMIDE HCL 2 MG PO TABS: 2.0000 mg | ORAL_TABLET | ORAL | Status: DC | PRN

## 2021-10-15 MED ORDER — SIMVASTATIN 20 MG PO TABS
20.0000 mg | ORAL_TABLET | Freq: Every day | ORAL | Status: DC
  Administered 2021-10-15 – 2021-10-16 (×2): 20 mg via ORAL
  Filled 2021-10-15 (×2): qty 1

## 2021-10-15 MED ORDER — PHENYTOIN SODIUM EXTENDED 100 MG PO CAPS
200.0000 mg | ORAL_CAPSULE | Freq: Every day | ORAL | Status: DC
  Administered 2021-10-16 – 2021-10-17 (×2): 200 mg via ORAL
  Filled 2021-10-15 (×2): qty 2

## 2021-10-15 MED ORDER — FLUTICASONE PROPIONATE 50 MCG/ACT NA SUSP
1.0000 | Freq: Every day | NASAL | Status: DC
  Administered 2021-10-16 – 2021-10-17 (×2): 1 via NASAL
  Filled 2021-10-15: qty 16

## 2021-10-15 MED ORDER — PHENOBARBITAL 32.4 MG PO TABS
64.8000 mg | ORAL_TABLET | Freq: Every day | ORAL | Status: DC
  Administered 2021-10-16 – 2021-10-17 (×2): 64.8 mg via ORAL
  Filled 2021-10-15 (×2): qty 2

## 2021-10-15 MED ORDER — ONDANSETRON HCL 4 MG PO TABS: 4.0000 mg | ORAL_TABLET | Freq: Four times a day (QID) | ORAL | Status: DC | PRN

## 2021-10-15 MED ORDER — ADULT MULTIVITAMIN W/MINERALS CH
1.0000 | ORAL_TABLET | Freq: Every day | ORAL | Status: DC
  Administered 2021-10-16 – 2021-10-17 (×2): 1 via ORAL
  Filled 2021-10-15 (×2): qty 1

## 2021-10-15 MED ORDER — DEXTROSE-NACL 5-0.9 % IV SOLN: INTRAVENOUS | Status: DC

## 2021-10-15 MED ORDER — ONDANSETRON HCL 4 MG/2ML IJ SOLN: 4.0000 mg | Freq: Four times a day (QID) | INTRAMUSCULAR | Status: DC | PRN

## 2021-10-15 NOTE — Assessment & Plan Note (Addendum)
Continue blood pressure control with metoprolol 25 mg po bid.  ? ?
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
## 2021-10-15 NOTE — ED Notes (Signed)
EKG given to Dr.Curatolo. Pt placed on pads and zoll at bedside on monitor. Pt denies any discomfort. No acute distress. Denies headache, sob, cp or any issues. Will continue to monitor.  ?

## 2021-10-15 NOTE — Progress Notes (Signed)
Per Leonel Ramsay, placement can wait until tomorrow.  ?

## 2021-10-15 NOTE — Assessment & Plan Note (Addendum)
CKD stage 3b. Hypomagnesemia  ? ?Renal function has remained stable with serum cr at 1,33, K is 4,0 and serum bicarbonate at 24. ?Mg 1,4 ? ?Patient will receive 2 g mag sulfate before discharge.  ? ?Plan to continue blood pressure control with metoprolol and follow up as outpatient.  ?

## 2021-10-15 NOTE — Assessment & Plan Note (Addendum)
Continue statin therapy with simvastatin.  ?

## 2021-10-15 NOTE — Progress Notes (Signed)
Per RN Anderson Malta pt is about to be moved to 2c05. EEG can not be done at this time  ?

## 2021-10-15 NOTE — Assessment & Plan Note (Addendum)
Patient was admitted to the cardiac telemetry unit, carvedilol was discontinued and she has placed on metoprolol 25 mg bid with good toleration.  ? ?Telemetry with HR in the 80 with positive PVC bigeminy and trigeminy. ?No bradycardia. ? ?Echocardiogram with mild reduction in LV systolic function 45 to 55^% with global hypokinesis. RV systolic function preserved. Moderate dilatation of left atrium, trivial pericardial effusion.  ?No significant valvular disease.  ?Chronic systolic heart failure.  ? ?Zio monitor has been placed and patient will be discharged home to continue blood pressure control with metoprolol. ?Follow up as outpatient with cardiology and primary care.  ?Advised to avoid driving until further follow up.  ? ?
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
## 2021-10-15 NOTE — Assessment & Plan Note (Addendum)
Continue anti-seizure regimen  ?EEG with no seizure activity.  ? ?Continue with phenobarbital and phenytoin.  ?PT and OR recommended no follow up.  ?
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
## 2021-10-16 ENCOUNTER — Observation Stay (HOSPITAL_COMMUNITY): Payer: Medicare Other

## 2021-10-16 DIAGNOSIS — N189 Chronic kidney disease, unspecified: Secondary | ICD-10-CM | POA: Diagnosis not present

## 2021-10-16 DIAGNOSIS — R569 Unspecified convulsions: Secondary | ICD-10-CM

## 2021-10-16 DIAGNOSIS — Z823 Family history of stroke: Secondary | ICD-10-CM | POA: Diagnosis not present

## 2021-10-16 DIAGNOSIS — Q282 Arteriovenous malformation of cerebral vessels: Secondary | ICD-10-CM | POA: Diagnosis not present

## 2021-10-16 DIAGNOSIS — I130 Hypertensive heart and chronic kidney disease with heart failure and stage 1 through stage 4 chronic kidney disease, or unspecified chronic kidney disease: Secondary | ICD-10-CM | POA: Diagnosis present

## 2021-10-16 DIAGNOSIS — N179 Acute kidney failure, unspecified: Secondary | ICD-10-CM | POA: Diagnosis present

## 2021-10-16 DIAGNOSIS — I5022 Chronic systolic (congestive) heart failure: Secondary | ICD-10-CM | POA: Diagnosis present

## 2021-10-16 DIAGNOSIS — I4510 Unspecified right bundle-branch block: Secondary | ICD-10-CM | POA: Diagnosis present

## 2021-10-16 DIAGNOSIS — N1832 Chronic kidney disease, stage 3b: Secondary | ICD-10-CM | POA: Diagnosis present

## 2021-10-16 DIAGNOSIS — R55 Syncope and collapse: Secondary | ICD-10-CM

## 2021-10-16 DIAGNOSIS — Z66 Do not resuscitate: Secondary | ICD-10-CM | POA: Diagnosis present

## 2021-10-16 DIAGNOSIS — I429 Cardiomyopathy, unspecified: Secondary | ICD-10-CM

## 2021-10-16 DIAGNOSIS — I493 Ventricular premature depolarization: Secondary | ICD-10-CM

## 2021-10-16 DIAGNOSIS — I2510 Atherosclerotic heart disease of native coronary artery without angina pectoris: Secondary | ICD-10-CM | POA: Diagnosis present

## 2021-10-16 DIAGNOSIS — I6523 Occlusion and stenosis of bilateral carotid arteries: Secondary | ICD-10-CM | POA: Diagnosis not present

## 2021-10-16 DIAGNOSIS — I10 Essential (primary) hypertension: Secondary | ICD-10-CM | POA: Diagnosis not present

## 2021-10-16 DIAGNOSIS — Z79899 Other long term (current) drug therapy: Secondary | ICD-10-CM | POA: Diagnosis not present

## 2021-10-16 DIAGNOSIS — D509 Iron deficiency anemia, unspecified: Secondary | ICD-10-CM | POA: Diagnosis present

## 2021-10-16 DIAGNOSIS — E7800 Pure hypercholesterolemia, unspecified: Secondary | ICD-10-CM | POA: Diagnosis present

## 2021-10-16 DIAGNOSIS — Z801 Family history of malignant neoplasm of trachea, bronchus and lung: Secondary | ICD-10-CM | POA: Diagnosis not present

## 2021-10-16 DIAGNOSIS — Z87891 Personal history of nicotine dependence: Secondary | ICD-10-CM | POA: Diagnosis not present

## 2021-10-16 DIAGNOSIS — E785 Hyperlipidemia, unspecified: Secondary | ICD-10-CM | POA: Diagnosis not present

## 2021-10-16 DIAGNOSIS — M4802 Spinal stenosis, cervical region: Secondary | ICD-10-CM | POA: Diagnosis present

## 2021-10-16 LAB — ECHOCARDIOGRAM COMPLETE
AR max vel: 1.79 cm2
AV Area VTI: 1.83 cm2
AV Area mean vel: 1.66 cm2
AV Mean grad: 5.7 mmHg
AV Peak grad: 9.8 mmHg
Ao pk vel: 1.57 m/s
Calc EF: 43.1 %
Height: 65 in
MV M vel: 6.04 m/s
MV Peak grad: 145.9 mmHg
S' Lateral: 3.7 cm
Single Plane A2C EF: 55.6 %
Single Plane A4C EF: 34.1 %
Weight: 2275.15 oz

## 2021-10-16 LAB — CBC
HCT: 26 % — ABNORMAL LOW (ref 36.0–46.0)
Hemoglobin: 8.4 g/dL — ABNORMAL LOW (ref 12.0–15.0)
MCH: 33.6 pg (ref 26.0–34.0)
MCHC: 32.3 g/dL (ref 30.0–36.0)
MCV: 104 fL — ABNORMAL HIGH (ref 80.0–100.0)
Platelets: 148 10*3/uL — ABNORMAL LOW (ref 150–400)
RBC: 2.5 MIL/uL — ABNORMAL LOW (ref 3.87–5.11)
RDW: 13.3 % (ref 11.5–15.5)
WBC: 5.7 10*3/uL (ref 4.0–10.5)
nRBC: 0 % (ref 0.0–0.2)

## 2021-10-16 LAB — MRSA NEXT GEN BY PCR, NASAL: MRSA by PCR Next Gen: NOT DETECTED

## 2021-10-16 LAB — URINALYSIS, ROUTINE W REFLEX MICROSCOPIC
Bilirubin Urine: NEGATIVE
Glucose, UA: NEGATIVE mg/dL
Hgb urine dipstick: NEGATIVE
Ketones, ur: NEGATIVE mg/dL
Leukocytes,Ua: NEGATIVE
Nitrite: NEGATIVE
Protein, ur: NEGATIVE mg/dL
Specific Gravity, Urine: 1.012 (ref 1.005–1.030)
pH: 7 (ref 5.0–8.0)

## 2021-10-16 LAB — COMPREHENSIVE METABOLIC PANEL
ALT: 26 U/L (ref 0–44)
AST: 29 U/L (ref 15–41)
Albumin: 3.1 g/dL — ABNORMAL LOW (ref 3.5–5.0)
Alkaline Phosphatase: 52 U/L (ref 38–126)
Anion gap: 6 (ref 5–15)
BUN: 35 mg/dL — ABNORMAL HIGH (ref 8–23)
CO2: 24 mmol/L (ref 22–32)
Calcium: 8.5 mg/dL — ABNORMAL LOW (ref 8.9–10.3)
Chloride: 110 mmol/L (ref 98–111)
Creatinine, Ser: 1.33 mg/dL — ABNORMAL HIGH (ref 0.44–1.00)
GFR, Estimated: 40 mL/min — ABNORMAL LOW (ref >60)
Glucose, Bld: 119 mg/dL — ABNORMAL HIGH (ref 70–99)
Potassium: 4.2 mmol/L (ref 3.5–5.1)
Sodium: 140 mmol/L (ref 135–145)
Total Bilirubin: 0.5 mg/dL (ref 0.3–1.2)
Total Protein: 5.5 g/dL — ABNORMAL LOW (ref 6.5–8.1)

## 2021-10-16 MED ORDER — METOPROLOL TARTRATE 25 MG PO TABS
25.0000 mg | ORAL_TABLET | Freq: Two times a day (BID) | ORAL | Status: DC
Start: 2021-10-16 — End: 2021-10-17
  Administered 2021-10-16 – 2021-10-17 (×3): 25 mg via ORAL
  Filled 2021-10-16 (×3): qty 1

## 2021-10-16 NOTE — Plan of Care (Signed)
EEG within normal limits. No further inpatient neurologic workup recommended at this time. Neurology to sign off, but feel free to re-engage if additional neurologic concerns arise. ? ?Su Monks, MD ?[facility] ?[phone] ? ?If 7pm- 7am, please page neurology on call as listed in [place]. ? ?

## 2021-10-16 NOTE — Progress Notes (Signed)
EEG complete - results pending 

## 2021-10-16 NOTE — Plan of Care (Signed)
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
## 2021-10-16 NOTE — Progress Notes (Signed)
OT Cancellation Note ? ?Patient Details ?Name: [name] ?MRN: [id number] ?DOB: 01/29/1936 ? ? ?Cancelled Treatment:    Reason Eval/Treat Not Completed: OT screened, no needs identified, will sign off (OT screen. Pt presenting near baseline during PT evaluation. Discussed with patient and daughter and both feel comfortable with her performance of ADLs. Will sign off.) ? ?[name] M Florine Sprenkle ?Faduma Cho MSOT, OTR/L ?Acute Rehab ?Pager: [phone] ?Office: [phone] ?10/16/2021, 2:52 PM ?

## 2021-10-16 NOTE — Procedures (Signed)
Patient Name: [name]  ?MRN: [id number]  ?Epilepsy Attending: Lora Havens  ?Referring Physician/Provider: August Albino, NP ?Date: 10/16/2021  ?Duration: 23.03 mins ? ?Patient history: 86 y.o. female with past medical history of Cerebral AV malformation, HTN, HLD, seizures disorder, CAD who presents today to the [facility] for evaluation of LOC. EEG to evaluate for seizure ? ?Level of alertness: Awake ? ?AEDs during EEG study: PHT, Phenobarb ? ?Technical aspects: This EEG study was done with scalp electrodes positioned according to the 10-20 International system of electrode placement. Electrical activity was acquired at a sampling rate of '500Hz'$  and reviewed with a high frequency filter of '70Hz'$  and a low frequency filter of '1Hz'$ . EEG data were recorded continuously and digitally stored.  ? ?Description: The posterior dominant rhythm consists of 8 Hz activity of moderate voltage (25-35 uV) seen predominantly in posterior head regions, symmetric and reactive to eye opening and eye closing. Physiologic photic driving was not seen during photic stimulation. Hyperventilation was not performed.    ? ?IMPRESSION: ?This study is within normal limits. No seizures or epileptiform discharges were seen throughout the recording. ? ?Lora Havens  ? ?
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
## 2021-10-17 ENCOUNTER — Other Ambulatory Visit (HOSPITAL_COMMUNITY): Payer: Self-pay

## 2021-10-17 ENCOUNTER — Inpatient Hospital Stay (HOSPITAL_BASED_OUTPATIENT_CLINIC_OR_DEPARTMENT_OTHER)
Admit: 2021-10-17 | Discharge: 2021-10-17 | Disposition: A | Discharge status: Home / Self Care | Payer: Medicare Other | Attending: Physician Assistant | Admitting: Physician Assistant

## 2021-10-17 ENCOUNTER — Inpatient Hospital Stay (HOSPITAL_COMMUNITY): Payer: Medicare Other

## 2021-10-17 DIAGNOSIS — I10 Essential (primary) hypertension: Secondary | ICD-10-CM

## 2021-10-17 DIAGNOSIS — I6523 Occlusion and stenosis of bilateral carotid arteries: Secondary | ICD-10-CM

## 2021-10-17 DIAGNOSIS — N179 Acute kidney failure, unspecified: Secondary | ICD-10-CM | POA: Diagnosis not present

## 2021-10-17 DIAGNOSIS — E785 Hyperlipidemia, unspecified: Secondary | ICD-10-CM | POA: Diagnosis not present

## 2021-10-17 DIAGNOSIS — R55 Syncope and collapse: Secondary | ICD-10-CM | POA: Diagnosis not present

## 2021-10-17 DIAGNOSIS — Q282 Arteriovenous malformation of cerebral vessels: Secondary | ICD-10-CM | POA: Diagnosis not present

## 2021-10-17 LAB — MAGNESIUM: Magnesium: 1.4 mg/dL — ABNORMAL LOW (ref 1.7–2.4)

## 2021-10-17 LAB — BASIC METABOLIC PANEL
Anion gap: 7 (ref 5–15)
BUN: 28 mg/dL — ABNORMAL HIGH (ref 8–23)
CO2: 24 mmol/L (ref 22–32)
Calcium: 8.5 mg/dL — ABNORMAL LOW (ref 8.9–10.3)
Chloride: 109 mmol/L (ref 98–111)
Creatinine, Ser: 1.33 mg/dL — ABNORMAL HIGH (ref 0.44–1.00)
GFR, Estimated: 40 mL/min — ABNORMAL LOW (ref >60)
Glucose, Bld: 103 mg/dL — ABNORMAL HIGH (ref 70–99)
Potassium: 4 mmol/L (ref 3.5–5.1)
Sodium: 140 mmol/L (ref 135–145)

## 2021-10-17 MED ORDER — MAGNESIUM SULFATE 2 GM/50ML IV SOLN
2.0000 g | Freq: Once | INTRAVENOUS | Status: AC
  Administered 2021-10-17: 2 g via INTRAVENOUS
  Filled 2021-10-17: qty 50

## 2021-10-17 MED ORDER — METOPROLOL TARTRATE 25 MG PO TABS
25.0000 mg | ORAL_TABLET | Freq: Two times a day (BID) | ORAL | 0 refills | Status: DC
  Filled 2021-10-17: qty 60, 30d supply, fill #0

## 2021-10-17 NOTE — Progress Notes (Signed)
?  Mobility Specialist Criteria Algorithm Info. ? ? ? 10/17/21 0952  ?Therapy Vitals  ?Pulse Rate 89  ?BP (!) 176/68 (post ambulation)  ?Patient Position (if appropriate) Supine  ?Mobility  ?Activity Ambulated with assistance in hallway  ?Range of Motion/Exercises Active;All extremities  ?Level of Assistance Standby assist, set-up cues, supervision of patient - no hands on  ?Assistive Device Front wheel walker  ?Distance Ambulated (ft) 500 ft  ?Activity Response Tolerated well  ? ?Patient received in supine agreeable to participate in mobility. Ambulated in hallway supervision level with slow steady gait. Returned to room without complaint or incident. Was left in bed with all needs met, call bell in reach.  ? ?10/17/2021 ?10:04 AM ? ?[name], CMS, BS EXP ?Acute Rehabilitation Services  ?UXBPQ:[phone] ?Office: [phone] ? ?
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
## 2021-10-17 NOTE — TOC Transition Note (Signed)
Transition of Care (TOC) - CM/SW Discharge Note ? ? ?Patient Details  ?Name: KEYLEN UZELAC ?MRN: [id number] ?Date of Birth: 31-Dec-1935 ? ?Transition of Care (TOC) CM/SW Contact:  ?Zenon Mayo, RN ?Phone Number: ?10/17/2021, 1:07 PM ? ? ?Clinical Narrative:    ?Patient is for dc today, will need zio patch prior to dc.  ? ? ?  ?  ? ? ?Patient Goals and CMS Choice ?  ?  ?  ? ?Discharge Placement ?  ?           ?  ?  ?  ?  ? ?Discharge Plan and Services ?  ?  ?           ?  ?  ?  ?  ?  ?  ?  ?  ?  ?  ? ?Social Determinants of Health (SDOH) Interventions ?  ? ? ?Readmission Risk Interventions ?   ? View : No data to display.  ?  ?  ?  ? ? ? ? ? ?

## 2021-10-17 NOTE — Plan of Care (Signed)

## 2021-10-17 NOTE — Progress Notes (Signed)
Carotid artery duplex completed. ?Refer to "CV Proc" under chart review to view preliminary results. ? ?10/17/2021 10:27 AM ?Kelby Aline., MHA, RVT, RDCS, RDMS   ?
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
## 2021-10-19 ENCOUNTER — Telehealth: Payer: Self-pay

## 2021-10-19 NOTE — Telephone Encounter (Signed)
Transition Care Management Unsuccessful Follow-up Telephone Call ? ?Date of discharge and from where:  10/17/2021, [facility] ? ? ? ?Attempts:  1st Attempt ? ?Reason for unsuccessful TCM follow-up call:  Left voice message, spoke to patient and she is going to follow up with cardiology and another doctor and will reach out to [place] to set up appt. With [place] after all is said and done. MLP ? ? ? ?

## 2021-10-28 DIAGNOSIS — R55 Syncope and collapse: Secondary | ICD-10-CM | POA: Diagnosis not present

## 2021-10-29 ENCOUNTER — Telehealth: Payer: Self-pay | Admitting: Internal Medicine

## 2021-10-29 DIAGNOSIS — I442 Atrioventricular block, complete: Secondary | ICD-10-CM

## 2021-10-29 NOTE — Telephone Encounter (Signed)
I received a message from remote monitoring that the patient went into complete heart block for 10 seconds and at that time felt lightheaded and dizzy. I instructed the monitoring service provider to contact the patient and have her be taken to the hospital for further evaluation and management given that prolonged heart block.  Billey Chang, MD Cardiology fellow

## 2021-10-30 DIAGNOSIS — M1990 Unspecified osteoarthritis, unspecified site: Secondary | ICD-10-CM | POA: Diagnosis not present

## 2021-10-30 DIAGNOSIS — G629 Polyneuropathy, unspecified: Secondary | ICD-10-CM | POA: Diagnosis not present

## 2021-10-30 DIAGNOSIS — D509 Iron deficiency anemia, unspecified: Secondary | ICD-10-CM | POA: Diagnosis not present

## 2021-10-30 DIAGNOSIS — E042 Nontoxic multinodular goiter: Secondary | ICD-10-CM | POA: Diagnosis not present

## 2021-10-30 DIAGNOSIS — R55 Syncope and collapse: Secondary | ICD-10-CM | POA: Diagnosis not present

## 2021-10-30 DIAGNOSIS — D649 Anemia, unspecified: Secondary | ICD-10-CM | POA: Diagnosis not present

## 2021-10-30 DIAGNOSIS — E785 Hyperlipidemia, unspecified: Secondary | ICD-10-CM | POA: Diagnosis not present

## 2021-10-30 DIAGNOSIS — G40909 Epilepsy, unspecified, not intractable, without status epilepticus: Secondary | ICD-10-CM | POA: Diagnosis not present

## 2021-10-30 DIAGNOSIS — Q282 Arteriovenous malformation of cerebral vessels: Secondary | ICD-10-CM | POA: Diagnosis not present

## 2021-10-30 DIAGNOSIS — M4800 Spinal stenosis, site unspecified: Secondary | ICD-10-CM | POA: Diagnosis not present

## 2021-10-30 NOTE — Telephone Encounter (Signed)
Appointment scheduled with Dr.Taylor Fri 5/26 at 2:00 pm.Advised if she has any more dizziness go to ED.

## 2021-10-30 NOTE — Addendum Note (Signed)
Addended by: Kathyrn Lass on: 10/30/2021 10:17 AM   Modules accepted: Orders

## 2021-10-30 NOTE — Telephone Encounter (Signed)
Spoke to patient Dr.[name] reviewed the monitor strips from last night.Heart beat was slow.He advised to stop taking Metoprolol.She stated she feels fine this morning.She got alittle dizzy when she bent over to take off her compression hose. He also advised to see EP as soon possible for a possible pacemaker.I left a message with EP scheduler to schedule you appointment.She will be calling you back with appointment.

## 2021-11-03 ENCOUNTER — Ambulatory Visit (INDEPENDENT_AMBULATORY_CARE_PROVIDER_SITE_OTHER): Payer: Medicare Other | Admitting: Internal Medicine

## 2021-11-03 ENCOUNTER — Encounter: Payer: Self-pay | Admitting: Internal Medicine

## 2021-11-03 VITALS — BP 164/60 | HR 92 | Ht 65.0 in | Wt 140.8 lb

## 2021-11-03 DIAGNOSIS — I459 Conduction disorder, unspecified: Secondary | ICD-10-CM | POA: Diagnosis not present

## 2021-11-03 DIAGNOSIS — R55 Syncope and collapse: Secondary | ICD-10-CM

## 2021-11-03 NOTE — Progress Notes (Signed)
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 She is a pleasant 86 yo woman with a h/o HTN, who has had recurrent syncope. She has worn a cardiac monitor demonstrating CHB with long pauses of over 10 seconds. She has RBBB. She denies chest pain. No sob. She has no warning that she is about to pass out.  Allergies  Allergen Reactions   Azithromycin Other (See Comments)    Reaction not recalled   Penicillins Hives and Other (See Comments)    Bad reaction as a child after a shot of Penicillin   Pravastatin Other (See Comments)    Reaction not recalled   Sulfa Drugs Cross Reactors Other (See Comments)    Reaction not recalled- stated she can take it "in small doses"   Tape Itching and Other (See Comments)    Cannot wear for an extended period of time   Naproxen Sodium Rash     Current Outpatient Medications  Medication Sig Dispense Refill   acetaminophen (TYLENOL) 500 MG tablet Take 500 mg by mouth every 6 (six) hours as needed for mild pain.     B Complex Vitamins (VITAMIN-B COMPLEX) TABS Take 1 tablet by mouth daily.     fluticasone (FLONASE) 50 MCG/ACT nasal spray Place 1-2 sprays into both nostrils daily as needed for allergies or rhinitis.     loperamide (IMODIUM A-D) 2 MG tablet Take 1 tablet (2 mg total) by mouth as needed for diarrhea or loose stools. 30 tablet 3   Multiple Vitamins-Minerals (MULTI COMPLETE/IRON) TABS Take 1 tablet by mouth daily with breakfast.     PHENobarbital (LUMINAL) 32.4 MG tablet Take 2 tablets (64.8 mg total) by mouth daily with breakfast. 60 tablet 1   phenytoin (DILANTIN) 100 MG ER capsule Take 200 mg by mouth daily with breakfast.     simvastatin (ZOCOR) 20 MG tablet Take 20 mg by mouth at bedtime.     SYSTANE ULTRA PF 0.4-0.3 % SOLN Place 1 drop into both eyes in the morning and at bedtime.     No current facility-administered medications for this visit.     Past Medical History:  Diagnosis Date   Abnormal  cardiovascular stress test    Carotid arterial disease (HCC)    MODERATE RIGHT   Cerebral AV malformation    DDD (degenerative disc disease)    SEVERE WITH SPINAL STENOSIS   Hypercholesterolemia    Hypertension    MVP (mitral valve prolapse)    MILD   Numbness    LEFT FOOT AND ANKLE   Osteopenia    Pseudo-gout     ROS:   All systems reviewed and negative except as noted in the HPI.   Past Surgical History:  Procedure Laterality Date   APPENDECTOMY     BREAST MASS EXCISION     CARPAL TUNNEL RELEASE     REMOVAL OF PARATHYROID GLAND     STRESS NUCLEAR STUDY     ABNORMAL   TONSILLECTOMY       Family History  Problem Relation Age of Onset   Stroke Mother    Lung cancer Father    Breast cancer Neg Hx      Social History   Socioeconomic History   Marital status: Widowed    Spouse name: Not on file   Number of children: 3   Years of education: Not on file   Highest education level: Not on file  Occupational History   Not  on file  Tobacco Use   Smoking status: Former    Packs/day: 1.00    Years: 14.00    Pack years: 14.00    Types: Cigarettes    Quit date: 11/07/1968    Years since quitting: 53.0   Smokeless tobacco: Never  Vaping Use   Vaping Use: Never used  Substance and Sexual Activity   Alcohol use: No   Drug use: No   Sexual activity: Not on file  Other Topics Concern   Not on file  Social History Narrative   [place] garden home at [place] Strain: Not on file  Food Insecurity: Not on file  Transportation Needs: Not on file  Physical Activity: Not on file  Stress: Not on file  Social Connections: Not on file  Intimate Partner Violence: Not on file     BP (!) 164/60   Pulse 92   Ht '5\' 5"'$  (1.651 m)   Wt 140 lb 12.8 oz (63.9 kg)   SpO2 98%   BMI 23.43 kg/m   Physical Exam:  Well appearing NAD HEENT: Unremarkable Neck:  No JVD, no thyromegally Lymphatics:  No adenopathy Back:   No CVA tenderness Lungs:  Clear with no wheezes HEART:  Regular rate rhythm, no murmurs, no rubs, no clicks Abd:  soft, positive bowel sounds, no organomegally, no rebound, no guarding Ext:  2 plus pulses, no edema, no cyanosis, no clubbing Skin:  No rashes no nodules Neuro:  CN II through XII intact, motor grossly intact  EKG - reviewed. NSR Zio monitor - NSR with long pauses   Assess/Plan:  Stokes Adams syncope - She has intermittent CHB. I have discussed the treatment options with the patient and recommend insertion of a DDD PM. I have reviewed the indications/risks/benefits/goals/expectations of PPM insertion and she wishes to proceed. HTN - once her PPM is implanted we will start metoprolol.  Carleene Overlie Aadyn Buchheit,MD

## 2021-11-03 NOTE — H&P (View-Only) (Signed)
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 She is a pleasant 86 yo woman with a h/o HTN, who has had recurrent syncope. She has worn a cardiac monitor demonstrating CHB with long pauses of over 10 seconds. She has RBBB. She denies chest pain. No sob. She has no warning that she is about to pass out.  Allergies  Allergen Reactions   Azithromycin Other (See Comments)    Reaction not recalled   Penicillins Hives and Other (See Comments)    Bad reaction as a child after a shot of Penicillin   Pravastatin Other (See Comments)    Reaction not recalled   Sulfa Drugs Cross Reactors Other (See Comments)    Reaction not recalled- stated she can take it "in small doses"   Tape Itching and Other (See Comments)    Cannot wear for an extended period of time   Naproxen Sodium Rash     Current Outpatient Medications  Medication Sig Dispense Refill   acetaminophen (TYLENOL) 500 MG tablet Take 500 mg by mouth every 6 (six) hours as needed for mild pain.     B Complex Vitamins (VITAMIN-B COMPLEX) TABS Take 1 tablet by mouth daily.     fluticasone (FLONASE) 50 MCG/ACT nasal spray Place 1-2 sprays into both nostrils daily as needed for allergies or rhinitis.     loperamide (IMODIUM A-D) 2 MG tablet Take 1 tablet (2 mg total) by mouth as needed for diarrhea or loose stools. 30 tablet 3   Multiple Vitamins-Minerals (MULTI COMPLETE/IRON) TABS Take 1 tablet by mouth daily with breakfast.     PHENobarbital (LUMINAL) 32.4 MG tablet Take 2 tablets (64.8 mg total) by mouth daily with breakfast. 60 tablet 1   phenytoin (DILANTIN) 100 MG ER capsule Take 200 mg by mouth daily with breakfast.     simvastatin (ZOCOR) 20 MG tablet Take 20 mg by mouth at bedtime.     SYSTANE ULTRA PF 0.4-0.3 % SOLN Place 1 drop into both eyes in the morning and at bedtime.     No current facility-administered medications for this visit.     Past Medical History:  Diagnosis Date   Abnormal  cardiovascular stress test    Carotid arterial disease (HCC)    MODERATE RIGHT   Cerebral AV malformation    DDD (degenerative disc disease)    SEVERE WITH SPINAL STENOSIS   Hypercholesterolemia    Hypertension    MVP (mitral valve prolapse)    MILD   Numbness    LEFT FOOT AND ANKLE   Osteopenia    Pseudo-gout     ROS:   All systems reviewed and negative except as noted in the HPI.   Past Surgical History:  Procedure Laterality Date   APPENDECTOMY     BREAST MASS EXCISION     CARPAL TUNNEL RELEASE     REMOVAL OF PARATHYROID GLAND     STRESS NUCLEAR STUDY     ABNORMAL   TONSILLECTOMY       Family History  Problem Relation Age of Onset   Stroke Mother    Lung cancer Father    Breast cancer Neg Hx      Social History   Socioeconomic History   Marital status: Widowed    Spouse name: Not on file   Number of children: 3   Years of education: Not on file   Highest education level: Not on file  Occupational History   Not  on file  Tobacco Use   Smoking status: Former    Packs/day: 1.00    Years: 14.00    Pack years: 14.00    Types: Cigarettes    Quit date: 11/07/1968    Years since quitting: 53.0   Smokeless tobacco: Never  Vaping Use   Vaping Use: Never used  Substance and Sexual Activity   Alcohol use: No   Drug use: No   Sexual activity: Not on file  Other Topics Concern   Not on file  Social History Narrative   [place] garden home at [place] Strain: Not on file  Food Insecurity: Not on file  Transportation Needs: Not on file  Physical Activity: Not on file  Stress: Not on file  Social Connections: Not on file  Intimate Partner Violence: Not on file     BP (!) 164/60   Pulse 92   Ht '5\' 5"'$  (1.651 m)   Wt 140 lb 12.8 oz (63.9 kg)   SpO2 98%   BMI 23.43 kg/m   Physical Exam:  Well appearing NAD HEENT: Unremarkable Neck:  No JVD, no thyromegally Lymphatics:  No adenopathy Back:   No CVA tenderness Lungs:  Clear with no wheezes HEART:  Regular rate rhythm, no murmurs, no rubs, no clicks Abd:  soft, positive bowel sounds, no organomegally, no rebound, no guarding Ext:  2 plus pulses, no edema, no cyanosis, no clubbing Skin:  No rashes no nodules Neuro:  CN II through XII intact, motor grossly intact  EKG - reviewed. NSR Zio monitor - NSR with long pauses   Assess/Plan:  Stokes Adams syncope - She has intermittent CHB. I have discussed the treatment options with the patient and recommend insertion of a DDD PM. I have reviewed the indications/risks/benefits/goals/expectations of PPM insertion and she wishes to proceed. HTN - once her PPM is implanted we will start metoprolol.  Carleene Overlie Gardner Servantes,MD

## 2021-11-03 NOTE — Patient Instructions (Addendum)
Medication Instructions:  Your physician recommends that you continue on your current medications as directed. Please refer to the Current Medication list given to you today.  Labwork: None ordered.  Testing/Procedures: None ordered.  Follow-Up:  SEE INSTRUCTION LETTER  Any Other Special Instructions Will Be Listed Below (If Applicable).  If you need a refill on your cardiac medications before your next appointment, please call your pharmacy.   Pacemaker Implantation, Adult Pacemaker implantation is a procedure to place a pacemaker inside the chest. A pacemaker is a small computer that sends electrical signals to the heart and helps the heart beat normally. A pacemaker also stores information about heart rhythms. You may need pacemaker implantation if you have: A slow heartbeat (bradycardia). Loss of consciousness that happens repeatedly (syncope) or repeated episodes of dizziness or light-headedness because of an irregular heart rate. Shortness of breath (dyspnea) due to heart problems. The pacemaker usually attaches to your heart through a wire called a lead. One or two leads may be needed. There are different types of pacemakers: Transvenous pacemaker. This type is placed under the skin or muscle of your upper chest area. The lead goes through a vein in the chest area to reach the inside of the heart. Epicardial pacemaker. This type is placed under the skin or muscle of your chest or abdomen. The lead goes through your chest to the outside of the heart. Tell a health care provider about: Any allergies you have. All medicines you are taking, including vitamins, herbs, eye drops, creams, and over-the-counter medicines. Any problems you or family members have had with anesthetic medicines. Any blood or bone disorders you have. Any surgeries you have had. Any medical conditions you have. Whether you are pregnant or may be pregnant. What are the risks? Generally, this is a safe  procedure. However, problems may occur, including: Infection. Bleeding. Failure of the pacemaker or the lead. Collapse of a lung or bleeding into a lung. Blood clot inside a blood vessel with a lead. Damage to the heart. Infection inside the heart (endocarditis). Allergic reactions to medicines. What happens before the procedure? Staying hydrated Follow instructions from your health care provider about hydration, which may include: Up to 2 hours before the procedure - you may continue to drink clear liquids, such as water, clear fruit juice, black coffee, and plain tea.  Eating and drinking restrictions Follow instructions from your health care provider about eating and drinking, which may include: 8 hours before the procedure - stop eating heavy meals or foods, such as meat, fried foods, or fatty foods. 6 hours before the procedure - stop eating light meals or foods, such as toast or cereal. 6 hours before the procedure - stop drinking milk or drinks that contain milk. 2 hours before the procedure - stop drinking clear liquids. Medicines Ask your health care provider about: Changing or stopping your regular medicines. This is especially important if you are taking diabetes medicines or blood thinners. Taking medicines such as aspirin and ibuprofen. These medicines can thin your blood. Do not take these medicines unless your health care provider tells you to take them. Taking over-the-counter medicines, vitamins, herbs, and supplements. Tests You may have: A heart evaluation. This may include: An electrocardiogram (ECG). This involves placing patches on your skin to check your heart rhythm. A chest X-ray. An echocardiogram. This is a test that uses sound waves (ultrasound) to produce an image of the heart. A cardiac rhythm monitor. This is used to record your heart rhythm  and any events for a longer period of time. Blood tests. Genetic testing. General instructions Do not use any  products that contain nicotine or tobacco for at least 4 weeks before the procedure. These products include cigarettes, e-cigarettes, and chewing tobacco. If you need help quitting, ask your health care provider. Ask your health care provider: How your surgery site will be marked. What steps will be taken to help prevent infection. These steps may include: Removing hair at the surgery site. Washing skin with a germ-killing soap. Receiving antibiotic medicine. Plan to have someone take you home from the hospital or clinic. If you will be going home right after the procedure, plan to have someone with you for 24 hours. What happens during the procedure? An IV will be inserted into one of your veins. You will be given one or more of the following: A medicine to help you relax (sedative). A medicine to numb the area (local anesthetic). A medicine to make you fall asleep (general anesthetic). The next steps vary depending on the type of pacemaker you will be getting. If you are getting a transvenous pacemaker: An incision will be made in your upper chest. A pocket will be made for the pacemaker. It may be placed under the skin or between layers of muscle. The lead will be inserted into a blood vessel that goes to the heart. While X-rays are taken by an imaging machine (fluoroscopy), the lead will be advanced through the vein to the inside of your heart. The other end of the lead will be tunneled under the skin and attached to the pacemaker. If you are getting an epicardial pacemaker: An incision will be made near your ribs or breastbone (sternum) for the lead. The lead will be attached to the outside of your heart. Another incision will be made in your chest or upper abdomen to create a pocket for the pacemaker. The free end of the lead will be tunneled under the skin and attached to the pacemaker. The transvenous or epicardial pacemaker will be tested. Imaging studies may be done to check the  lead position. The incisions will be closed with stitches (sutures), adhesive strips, or skin glue. Bandages (dressings) will be placed over the incisions. The procedure may vary among health care providers and hospitals. What happens after the procedure? Your blood pressure, heart rate, breathing rate, and blood oxygen level will be monitored until you leave the hospital or clinic. You may be given antibiotics. You will be given pain medicine. An ECG and chest X-rays will be done. You may need to wear a continuous type of ECG (Holter monitor) to check your heart rhythm. Your health care provider will program the pacemaker. If you were given a sedative during the procedure, it can affect you for several hours. Do not drive or operate machinery until your health care provider says that it is safe. You will be given a pacemaker identification card. This card lists the implant date, device model, and manufacturer of your pacemaker. Summary A pacemaker is a small computer that sends electrical signals to the heart and helps the heart beat normally. There are different types of pacemakers. A pacemaker may be placed under the skin or muscle of your chest or abdomen. Follow instructions from your health care provider about eating and drinking and about taking medicines before the procedure. This information is not intended to replace advice given to you by your health care provider. Make sure you discuss any questions you have with  your health care provider. Document Revised: 02/07/2021 Document Reviewed: 04/29/2019 Elsevier Patient Education  [place].

## 2021-11-07 ENCOUNTER — Ambulatory Visit (HOSPITAL_COMMUNITY): Payer: Medicare Other

## 2021-11-07 ENCOUNTER — Encounter (HOSPITAL_COMMUNITY)
Admission: RE | Disposition: A | Discharge status: Home / Self Care | Payer: Self-pay | Source: Home / Self Care | Attending: Internal Medicine

## 2021-11-07 ENCOUNTER — Other Ambulatory Visit: Payer: Self-pay

## 2021-11-07 ENCOUNTER — Ambulatory Visit (HOSPITAL_COMMUNITY)
Admission: RE | Admit: 2021-11-07 | Discharge: 2021-11-07 | Disposition: A | Discharge status: Home / Self Care | Payer: Medicare Other | Attending: Internal Medicine | Admitting: Internal Medicine

## 2021-11-07 DIAGNOSIS — R55 Syncope and collapse: Secondary | ICD-10-CM | POA: Diagnosis not present

## 2021-11-07 DIAGNOSIS — I700 Atherosclerosis of aorta: Secondary | ICD-10-CM | POA: Diagnosis not present

## 2021-11-07 DIAGNOSIS — R001 Bradycardia, unspecified: Secondary | ICD-10-CM | POA: Insufficient documentation

## 2021-11-07 DIAGNOSIS — I4510 Unspecified right bundle-branch block: Secondary | ICD-10-CM | POA: Insufficient documentation

## 2021-11-07 DIAGNOSIS — I10 Essential (primary) hypertension: Secondary | ICD-10-CM | POA: Diagnosis not present

## 2021-11-07 DIAGNOSIS — Z950 Presence of cardiac pacemaker: Secondary | ICD-10-CM | POA: Diagnosis not present

## 2021-11-07 DIAGNOSIS — Z87891 Personal history of nicotine dependence: Secondary | ICD-10-CM | POA: Insufficient documentation

## 2021-11-07 DIAGNOSIS — I442 Atrioventricular block, complete: Secondary | ICD-10-CM | POA: Insufficient documentation

## 2021-11-07 HISTORY — PX: PACEMAKER IMPLANT: EP1218

## 2021-11-07 SURGERY — PACEMAKER IMPLANT

## 2021-11-07 MED ORDER — SODIUM CHLORIDE 0.9 % IV SOLN
80.0000 mg | INTRAVENOUS | Status: AC
  Administered 2021-11-07: 80 mg
  Filled 2021-11-07: qty 2

## 2021-11-07 MED ORDER — POVIDONE-IODINE 10 % EX SWAB: 2.0000 "application " | Freq: Once | CUTANEOUS | Status: DC

## 2021-11-07 MED ORDER — LIDOCAINE HCL (PF) 1 % IJ SOLN
INTRAMUSCULAR | Status: DC | PRN
  Administered 2021-11-07: 60 mL

## 2021-11-07 MED ORDER — VANCOMYCIN HCL IN DEXTROSE 1-5 GM/200ML-% IV SOLN
1000.0000 mg | INTRAVENOUS | Status: AC
  Administered 2021-11-07: 1000 mg via INTRAVENOUS

## 2021-11-07 MED ORDER — SODIUM CHLORIDE 0.9 % IV SOLN: INTRAVENOUS | Status: DC

## 2021-11-07 MED ORDER — LIDOCAINE HCL (PF) 1 % IJ SOLN
INTRAMUSCULAR | Status: AC
Start: 2021-11-07 — End: ?
  Filled 2021-11-07: qty 60

## 2021-11-07 MED ORDER — ACETAMINOPHEN 325 MG PO TABS: 325.0000 mg | ORAL_TABLET | ORAL | Status: DC | PRN

## 2021-11-07 MED ORDER — FENTANYL CITRATE (PF) 100 MCG/2ML IJ SOLN
INTRAMUSCULAR | Status: AC
  Filled 2021-11-07: qty 2

## 2021-11-07 MED ORDER — SODIUM CHLORIDE 0.9 % IV SOLN
INTRAVENOUS | Status: AC
  Filled 2021-11-07: qty 2

## 2021-11-07 MED ORDER — VANCOMYCIN HCL IN DEXTROSE 1-5 GM/200ML-% IV SOLN
INTRAVENOUS | Status: AC
  Filled 2021-11-07: qty 200

## 2021-11-07 MED ORDER — CHLORHEXIDINE GLUCONATE 4 % EX LIQD
4.0000 | Freq: Once | CUTANEOUS | Status: DC
Start: 2021-11-07 — End: 2021-11-07
  Filled 2021-11-07: qty 60

## 2021-11-07 MED ORDER — MIDAZOLAM HCL 5 MG/5ML IJ SOLN
INTRAMUSCULAR | Status: AC
  Filled 2021-11-07: qty 5

## 2021-11-07 MED ORDER — ONDANSETRON HCL 4 MG/2ML IJ SOLN: 4.0000 mg | Freq: Four times a day (QID) | INTRAMUSCULAR | Status: DC | PRN

## 2021-11-07 MED ORDER — MIDAZOLAM HCL 5 MG/5ML IJ SOLN
INTRAMUSCULAR | Status: DC | PRN
Start: 2021-11-07 — End: 2021-11-07
  Administered 2021-11-07 (×3): 1 mg via INTRAVENOUS

## 2021-11-07 MED ORDER — FENTANYL CITRATE (PF) 100 MCG/2ML IJ SOLN
INTRAMUSCULAR | Status: DC | PRN
  Administered 2021-11-07 (×3): 12.5 ug via INTRAVENOUS

## 2021-11-07 SURGICAL SUPPLY — 12 items
CABLE SURGICAL S-101-97-12 (CABLE) ×2 IMPLANT
CATH RIGHTSITE C315HIS02 (CATHETERS) ×1 IMPLANT
IPG PACE AZUR XT DR MRI W1DR01 (Pacemaker) IMPLANT
LEAD CAPSURE NOVUS 5076-52CM (Lead) ×1 IMPLANT
LEAD SELECT SECURE 3830 383069 (Lead) IMPLANT
PACE AZURE XT DR MRI W1DR01 (Pacemaker) ×2 IMPLANT
PAD DEFIB RADIO PHYSIO CONN (PAD) ×2 IMPLANT
SELECT SECURE 3830 383069 (Lead) ×2 IMPLANT
SHEATH 7FR PRELUDE SNAP 13 (SHEATH) ×2 IMPLANT
SLITTER 6232ADJ (MISCELLANEOUS) ×1 IMPLANT
TRAY PACEMAKER INSERTION (PACKS) ×2 IMPLANT
WIRE HI TORQ VERSACORE-J 145CM (WIRE) ×1 IMPLANT

## 2021-11-07 NOTE — Discharge Instructions (Signed)
After Your Pacemaker   You have a [employer] Pacemaker  ACTIVITY Do not lift your arm above shoulder height for 1 week after your procedure. After 7 days, you may progress as below.  You should remove your sling 24 hours after your procedure, unless otherwise instructed by your provider.     Tuesday November 14, 2021  Wednesday November 15, 2021 Thursday November 16, 2021 Friday November 17, 2021   Do not lift, push, pull, or carry anything over 10 pounds with the affected arm until 6 weeks (Tuesday December 19, 2021 ) after your procedure.   You may drive AFTER your wound check, unless you have been told otherwise by your provider.   Ask your healthcare provider when you can go back to work   INCISION/Dressing If you are on a blood thinner such as Coumadin, Xarelto, Eliquis, Plavix, or Pradaxa please confirm with your provider when this should be resumed.   If large square, outer bandage is left in place, this can be removed after 24 hours from your procedure. Do not remove steri-strips or glue as below.   Monitor your Pacemaker site for redness, swelling, and drainage. Call the device clinic at [phone] if you experience these symptoms or fever/chills.  If your incision is sealed with Steri-strips or staples, you may shower 7 days after your procedure or when told by your provider. Do not remove the steri-strips or let the shower hit directly on your site. You may wash around your site with soap and water.    If you were discharged in a sling, please do not wear this during the day more than 48 hours after your surgery unless otherwise instructed. This may increase the risk of stiffness and soreness in your shoulder.   Avoid lotions, ointments, or perfumes over your incision until it is well-healed.  You may use a hot tub or a pool AFTER your wound check appointment if the incision is completely closed.  Pacemaker Alerts:  Some alerts are vibratory and others beep. These are NOT emergencies. Please  call our office to let us know. If this occurs at night or on weekends, it can wait until the next business day. Send a remote transmission.  If your device is capable of reading fluid status (for heart failure), you will be offered monthly monitoring to review this with you.   DEVICE MANAGEMENT Remote monitoring is used to monitor your pacemaker from home. This monitoring is scheduled every 91 days by our office. It allows [place] to keep an eye on the functioning of your device to ensure it is working properly. You will routinely see your Electrophysiologist annually (more often if necessary).   You should receive your ID card for your new device in 4-8 weeks. Keep this card with you at all times once received. Consider wearing a medical alert bracelet or necklace.  Your Pacemaker may be MRI compatible. This will be discussed at your next office visit/wound check.  You should avoid contact with strong electric or magnetic fields.   Do not use amateur (ham) radio equipment or electric (arc) welding torches. MP3 player headphones with magnets should not be used. Some devices are safe to use if held at least 12 inches (30 cm) from your Pacemaker. These include power tools, lawn mowers, and speakers. If you are unsure if something is safe to use, ask your health care provider.  When using your cell phone, hold it to the ear that is on the opposite side from the  Pacemaker. Do not leave your cell phone in a pocket over the Pacemaker.  You may safely use electric blankets, heating pads, computers, and microwave ovens.  Call the office right away if: You have chest pain. You feel more short of breath than you have felt before. You feel more light-headed than you have felt before. Your incision starts to open up.  This information is not intended to replace advice given to you by your health care provider. Make sure you discuss any questions you have with your health care provider.

## 2021-11-07 NOTE — Interval H&P Note (Signed)
History and Physical Interval Note:  11/07/2021 11:34 AM  [name]  has presented today for surgery, with the diagnosis of heart block.  The various methods of treatment have been discussed with the patient and family. After consideration of risks, benefits and other options for treatment, the patient has consented to  Procedure(s): PACEMAKER IMPLANT (N/A) as a surgical intervention.  The patient's history has been reviewed, patient examined, no change in status, stable for surgery.  I have reviewed the patient's chart and labs.  Questions were answered to the patient's satisfaction.     [name]

## 2021-11-08 ENCOUNTER — Telehealth: Payer: Self-pay

## 2021-11-08 ENCOUNTER — Encounter (HOSPITAL_COMMUNITY): Payer: Self-pay | Admitting: Internal Medicine

## 2021-11-08 NOTE — Telephone Encounter (Signed)
Attempted to contact Pt at number on file. No answer.  Call did not go to VM.  Will try to contact Pt again.  No remote transmission has been received yet.  Follow-up after same day discharge: Implant date: 11/07/2021 MD: Cristopher Peru, MD Device: MDT PPM Location: left upper chest   Wound check visit: 11/22/2021 at 10:00 am 90 day MD follow-up: 02/20/2022 at 2:30 pm  Remote Transmission received:  Dressing/sling removed:

## 2021-11-08 NOTE — Telephone Encounter (Signed)
-----   Message from Shirley Friar, PA-C sent at 11/07/2021  4:25 PM EDT ----- Same day PPM 5/30 GT

## 2021-11-09 ENCOUNTER — Encounter: Payer: Self-pay | Admitting: Adult Health

## 2021-11-09 ENCOUNTER — Non-Acute Institutional Stay (SKILLED_NURSING_FACILITY): Payer: Medicare Other | Admitting: Adult Health

## 2021-11-09 DIAGNOSIS — I459 Conduction disorder, unspecified: Secondary | ICD-10-CM

## 2021-11-09 DIAGNOSIS — Z950 Presence of cardiac pacemaker: Secondary | ICD-10-CM | POA: Diagnosis not present

## 2021-11-09 DIAGNOSIS — D638 Anemia in other chronic diseases classified elsewhere: Secondary | ICD-10-CM | POA: Diagnosis not present

## 2021-11-09 DIAGNOSIS — Q282 Arteriovenous malformation of cerebral vessels: Secondary | ICD-10-CM | POA: Diagnosis not present

## 2021-11-09 DIAGNOSIS — E782 Mixed hyperlipidemia: Secondary | ICD-10-CM | POA: Diagnosis not present

## 2021-11-09 DIAGNOSIS — I10 Essential (primary) hypertension: Secondary | ICD-10-CM | POA: Diagnosis not present

## 2021-11-09 DIAGNOSIS — R413 Other amnesia: Secondary | ICD-10-CM | POA: Diagnosis not present

## 2021-11-09 LAB — CBC AND DIFFERENTIAL
HCT: 29 — AB (ref 36–46)
Hemoglobin: 9.9 — AB (ref 12.0–16.0)
Platelets: 158 10*3/uL (ref 150–400)
WBC: 8.3

## 2021-11-09 LAB — CBC: RBC: 2.93 — AB (ref 3.87–5.11)

## 2021-11-09 MED ORDER — MAGNESIUM OXIDE 400 MG PO CAPS: 400.0000 mg | ORAL_CAPSULE | Freq: Every day | ORAL | Status: DC

## 2021-11-09 NOTE — Telephone Encounter (Signed)
Received call from [place].  Pt has set up bedside monitor and is transmitting.  She has removed dressing/sling.  She is advised to call device clinic directly if any s/s of infection.  Call was disconnected.  Await further needs.

## 2021-11-09 NOTE — Progress Notes (Signed)
Location:  [facility] Room Number: 156-A Place of Service:  SNF [phone]) Provider:  Royal Hawthorn, NP   Patient Care Team: Virgie Dad, MD as PCP - General (Internal Medicine) Martin[place], [name], MD as PCP - Cardiology (Cardiology)  Extended Emergency Contact Information Primary Emergency Contact: [place] Mobile Phone: [phone] Relation: Daughter Secondary Emergency Contact: AZYRIAH, NEVINS Mobile Phone: [phone] Relation: Son  Code Status:  DNR Goals of care: Advanced Directive information    11/09/2021    3:12 PM  Advanced Directives  Does Patient Have a Medical Advance Directive? Yes  Type of Paramedic of [place];Living will;Out of facility DNR (pink MOST or yellow form)   Does patient want to make changes to medical advance directive? No - Patient declined  Copy of [place] in Chart? No - copy requested     Significant value     Chief Complaint  Patient presents with   Follow-up    Pacemaker follow-up     HPI:  Pt is a 86 y.o. female seen today for an acute visit for follow up after admission to rehab s/p pacemaker. PMH cerebral AV malformation, HTN, CAD, spinal stenosis, right shoulder, diarrhea, hyponatremia, and anemia. Also has short term memory loss.   She was admitted to the hospital 10/15/21 due to syncope and found to have trigeminy and bigeminy. Echocardiogram with mild reduction in LV systolic function 45 to 85^% with global hypokinesis. RV systolic function preserved. Moderate dilatation of left atrium, trivial pericardial effusion. She was discharged with a zio patch.   She was seen back in the office by Dr. Lovena Le and found to be in [place] on 5/26. She received a DDD pacemaker 11/07/21.   Due to restrictions in mobility on the left side and chronic shoulder issue on the right she is admitted to rehab for therapy and self care deficit. Prior to this issue she had already  planned to move to assisted living.     Past Medical History:  Diagnosis Date   Abnormal cardiovascular stress test    Carotid arterial disease (HCC)    MODERATE RIGHT   Cerebral AV malformation    DDD (degenerative disc disease)    SEVERE WITH SPINAL STENOSIS   Hypercholesterolemia    Hypertension    MVP (mitral valve prolapse)    MILD   Numbness    LEFT FOOT AND ANKLE   Osteopenia    Pseudo-gout    Past Surgical History:  Procedure Laterality Date   APPENDECTOMY     BREAST MASS EXCISION     CARPAL TUNNEL RELEASE     PACEMAKER IMPLANT N/A 11/07/2021   Procedure: PACEMAKER IMPLANT;  Surgeon: Evans Lance, MD;  Location: [facility];  Service: Cardiovascular;  Laterality: N/A;   REMOVAL OF PARATHYROID GLAND     STRESS NUCLEAR STUDY     ABNORMAL   TONSILLECTOMY      Allergies  Allergen Reactions   Azithromycin Other (See Comments)    Reaction not recalled   Penicillins Hives and Other (See Comments)    Bad reaction as a child after a shot of Penicillin   Pravastatin Other (See Comments)    Reaction not recalled   Sulfa Drugs Cross Reactors Other (See Comments)    Reaction not recalled- stated she can take it "in small doses"   Tape Itching and Other (See Comments)    Cannot wear for an extended period of time  Naproxen Sodium Rash    Outpatient Encounter Medications as of 11/09/2021  Medication Sig   acetaminophen (TYLENOL) 500 MG tablet Take 500 mg by mouth every 6 (six) hours as needed for mild pain.   B Complex Vitamins (VITAMIN-B COMPLEX) TABS Take 1 tablet by mouth daily.   fluticasone (FLONASE) 50 MCG/ACT nasal spray Place 1-2 sprays into both nostrils daily as needed for allergies or rhinitis.   loperamide (IMODIUM A-D) 2 MG tablet Take 1 tablet (2 mg total) by mouth as needed for diarrhea or loose stools.   Multiple Vitamins-Minerals (MULTI COMPLETE/IRON) TABS Take 1 tablet by mouth daily with breakfast.   PHENobarbital (LUMINAL) 32.4 MG tablet  Take 2 tablets (64.8 mg total) by mouth daily with breakfast.   phenytoin (DILANTIN) 100 MG ER capsule Take 200 mg by mouth daily with breakfast.   simvastatin (ZOCOR) 20 MG tablet Take 20 mg by mouth at bedtime.   SYSTANE ULTRA PF 0.4-0.3 % SOLN Place 1 drop into both eyes in the morning and at bedtime.   No facility-administered encounter medications on file as of 11/09/2021.    Review of Systems  Constitutional:  Positive for activity change. Negative for appetite change, chills, diaphoresis, fatigue, fever and unexpected weight change.  HENT:  Negative for congestion.   Respiratory:  Negative for cough, shortness of breath and wheezing.   Cardiovascular:  Negative for chest pain, palpitations and leg swelling.  Gastrointestinal:  Negative for abdominal distention, abdominal pain, constipation and diarrhea.  Genitourinary:  Negative for difficulty urinating and dysuria.  Musculoskeletal:  Negative for arthralgias, back pain, gait problem, joint swelling and myalgias.  Skin:  Positive for wound.  Neurological:  Positive for syncope. Negative for dizziness, tremors, seizures, facial asymmetry, speech difficulty, weakness, light-headedness, numbness and headaches.  Psychiatric/Behavioral:  Negative for agitation, behavioral problems and confusion.    Immunization History  Administered Date(s) Administered   Influenza Split 03/28/2009, 03/17/2010, 03/26/2011, 04/01/2012, 03/05/2013   Influenza, Quadrivalent, Recombinant, Inj, Pf 03/08/2018, 02/13/2019, 02/24/2020   Influenza,inj,Quad PF,6+ Mos 02/25/2014, 03/05/2015   Influenza-Unspecified 02/09/2014, 02/25/2016, 04/06/2017, 04/25/2021   Moderna Sars-Covid-2 Vaccination 07/17/2019, 08/14/2019, 02/24/2020, 04/26/2020   Pneumococcal Conjugate-13 11/26/2014   Pneumococcal-Unspecified 06/23/2003, 11/10/2012   Td (Adult), 2 Lf Tetanus Toxid, Preservative Free 06/11/2000, 11/06/2011   Unspecified SARS-COV-2 Vaccination 04/28/2021   Zoster  Recombinat (Shingrix) 01/09/2019, 05/08/2019   Zoster, Live 01/15/2006, 01/15/2019   Pertinent  Health Maintenance Due  Topic Date Due   DEXA SCAN  Never done   INFLUENZA VACCINE  01/09/2022      10/15/2021    7:48 PM 10/16/2021    8:00 AM 10/16/2021    7:30 PM 10/17/2021    7:21 AM 11/07/2021   11:40 AM  Fall Risk  Patient Fall Risk Level Moderate fall risk Moderate fall risk Moderate fall risk Moderate fall risk Low fall risk   Functional Status Survey:    Vitals:   11/09/21 1510  BP: (!) 148/71  Pulse: 95  Resp: 20  Temp: 98.1 F (36.7 C)  SpO2: 96%  Weight: 136 lb 6.4 oz (61.9 kg)  Height: '5\' 5"'$  (1.651 m)   Body mass index is 22.7 kg/m. Physical Exam Vitals and nursing note reviewed.  Constitutional:      General: She is not in acute distress.    Appearance: She is not diaphoretic.  HENT:     Head: Normocephalic and atraumatic.  Neck:     Vascular: No JVD.  Cardiovascular:     Rate and Rhythm:  Normal rate and regular rhythm.     Heart sounds: No murmur heard. Pulmonary:     Effort: Pulmonary effort is normal. No respiratory distress.     Breath sounds: Normal breath sounds. No wheezing.  Abdominal:     General: Abdomen is flat. Bowel sounds are normal.     Palpations: Abdomen is soft.  Musculoskeletal:     Right lower leg: No edema.  Skin:    General: Skin is warm and dry.     Comments: Left chest wound with steri strips, no drainage or redness  Neurological:     Mental Status: She is alert and oriented to person, place, and time.  Psychiatric:        Mood and Affect: Mood normal.    Labs reviewed: Recent Labs    04/14/21 0338 04/17/21 0000 04/25/21 0000 10/15/21 1456 10/16/21 0110 10/17/21 0037  NA 131* 134*  134*   < > 141 140 140  K 3.8 4.0  4.0   < > 4.6 4.2 4.0  CL 98 96*  96*   < > 108 110 109  CO2 25 26*  26*   < > '24 24 24  '$ GLUCOSE 111*  --    < > 122* 119* 103*  BUN 28* 24*  24*   < > 35* 35* 28*  CREATININE 0.87 0.9  0.9   < >  1.41* 1.33* 1.33*  CALCIUM 8.4* 8.9  8.9   < > 9.1 8.5* 8.5*  MG 1.4* 1.7  --   --   --  1.4*   < > = values in this interval not displayed.   Recent Labs    05/10/21 1113 10/15/21 1456 10/16/21 0110  AST 19 38 29  ALT 12 33 26  ALKPHOS 47 61 52  BILITOT 0.3 0.6 0.5  PROT 6.7 6.2* 5.5*  ALBUMIN 3.7 3.4* 3.1*   Recent Labs    05/10/21 1113 06/21/21 1430 10/15/21 1456 10/16/21 0110 11/09/21 0000  WBC 5.3 7.8 5.6 5.7 8.3  NEUTROABS 3.8 5,686 4.2  --   --   HGB 9.5* 10.6* 9.1* 8.4* 9.9*  HCT 27.6* 30.9* 28.1* 26.0* 29*  MCV 97.9 96.9 104.9* 104.0*  --   PLT 249.0 288 172 148* 158   Lab Results  Component Value Date   TSH 1.685 07/21/2020   No results found for: HGBA1C No results found for: CHOL, HDL, LDLCALC, LDLDIRECT, TRIG, CHOLHDL  Significant Diagnostic Results in last 30 days:  DG Chest 2 View  Result Date: 11/07/2021 CLINICAL DATA:  Status post pacemaker insertion EXAM: CHEST - 2 VIEW COMPARISON:  10/15/2021 FINDINGS: Lungs are clear.  No pleural effusion or pneumothorax. The heart is normal in size. Thoracic aortic atherosclerosis. Left subclavian dual lead pacemaker, in satisfactory position. Visualized osseous structures are within normal limits. IMPRESSION: Left subclavian dual lead pacemaker, in satisfactory position. Electronically Signed   By: Julian Hy M.D.   On: 11/07/2021 17:39   CT Head Wo Contrast  Result Date: 10/15/2021 CLINICAL DATA:  Seizure, new onset, history of trauma. EXAM: CT HEAD WITHOUT CONTRAST TECHNIQUE: Contiguous axial images were obtained from the base of the skull through the vertex without intravenous contrast. RADIATION DOSE REDUCTION: This exam was performed according to the departmental dose-optimization program which includes automated exposure control, adjustment of the mA and/or kV according to patient size and/or use of iterative reconstruction technique. COMPARISON:  None FINDINGS: Brain: No abnormality seen affecting the  brainstem or cerebellum. Right cerebral  hemisphere shows minimal small vessel change of the white matter. In the left frontal lobe, there is a region of calcification measuring approximally 5.5 x 4 x 3 cm that probably relates to a chronic arteriovenous malformation. Enlarged draining vessels noted over the convexity. No sign of acute hemorrhage. No hydrocephalus. No extra-axial collection. Vascular: Atherosclerotic calcification affects the major vessels at the base of the brain in addition. Skull: No acute calvarial finding. Sinuses/Orbits: Opacification of the left maxillary, anterior ethmoid and frontal sinuses. Orbits negative. Other: None IMPRESSION: No sign of acute stroke. Lesion of the left frontal lobe most consistent with a chronic arteriovenous malformation with calcification and enlarged draining vessels. Overall size of the nidus is approximately 5.5 x 4 x 3 cm. No sign of acute or recent hemorrhage related to this lesion. Left paranasal sinus disease with ostiomeatal pattern, complete opacification of the left maxillary, anterior ethmoid and frontal sinuses. Electronically Signed   By: Nelson Chimes M.D.   On: 10/15/2021 15:53   CT Cervical Spine Wo Contrast  Result Date: 10/15/2021 CLINICAL DATA:  Neck trauma, intoxicated or tended. EXAM: CT CERVICAL SPINE WITHOUT CONTRAST TECHNIQUE: Multidetector CT imaging of the cervical spine was performed without intravenous contrast. Multiplanar CT image reconstructions were also generated. RADIATION DOSE REDUCTION: This exam was performed according to the departmental dose-optimization program which includes automated exposure control, adjustment of the mA and/or kV according to patient size and/or use of iterative reconstruction technique. COMPARISON:  None FINDINGS: Alignment: No traumatic malalignment. Straightening and slight kyphotic curvature of the cervical spine. Skull base and vertebrae: No evidence of regional fracture. Soft tissues and spinal  canal: No traumatic soft tissue finding. Disc levels: Chronic arthropathy at the C1-2 articulation, probably secondary to chronic CPPD. Sufficient patency of the foramen magnum. Moderate narrowing of the spinal canal, AP diameter 8 mm. Posterior ligamentous calcification contributes to the stenosis. C2-3: Endplate osteophytes and bulging of the disc. Posterior scratch that ligamentum flavum calcification. Spinal stenosis with AP diameter in the midline only 5-6 mm. C3-4: Endplate osteophytes and bulging of the disc. Ligamentum flavum calcification. Spinal stenosis with AP diameter of the canal only 5-6 mm. C4-5: Endplate osteophytes and bulging of the disc. Ligamentum flavum calcification. Spinal stenosis with AP diameter of the canal only 5-6 mm. C5-6: Endplate osteophytes and bulging of the disc. Ligamentum flavum calcification. Spinal stenosis with AP diameter of the canal 6-7 mm. C6-7: Spondylosis with endplate osteophytes and bulging of the disc. Spinal stenosis with AP diameter of the canal only 6-7 mm. C7-T1: Endplate osteophytes and bulging of the disc. Ligamentum flavum calcification. No compressive canal stenosis. Upper chest: Negative Other: None IMPRESSION: No acute or traumatic finding. Chronic arthropathy at the C1-2 articulation, possibly secondary to CPPD, with canal stenosis, AP diameter 8 mm. Chronic spondylosis throughout the cervical region. Ligamentum flavum calcification. Findings probably secondary to CPPD. Severe canal stenosis from C2-3 through C6-7, AP diameter of the canal 5-7 mm throughout that region. Electronically Signed   By: Nelson Chimes M.D.   On: 10/15/2021 15:50   EP PPM/ICD IMPLANT  Result Date: 11/07/2021 CONCLUSIONS:  1. Successful implantation of a Medtronic dual-chamber pacemaker for symptomatic bradycardia due to intermittent CHB  2. No early apparent complications.       Cristopher Peru, MD 11/07/2021 2:52 PM    DG Chest Portable 1 View  Result Date: 10/15/2021 CLINICAL  DATA:  Syncope. EXAM: PORTABLE CHEST 1 VIEW COMPARISON:  February 28, 2005 FINDINGS: Transcutaneous pacer leads are identified, obscuring evaluation  of the mediastinum and a portion of the heart. The cardiac silhouette is enlarged on this film. No pneumothorax. The hila and mediastinum are unremarkable. Vascular crowding at the medial right lung base. No overt edema. Possible small left effusion with underlying atelectasis. No other acute abnormalities in the chest. There is an age-indeterminate fracture of the mid right humeral diaphysis. Recommend clinical correlation. IMPRESSION: 1. Age-indeterminate fracture of the mid right humeral diaphysis. 2. Probable small effusion underlying atelectasis in the left base. 3. No other acute abnormalities are identified. Electronically Signed   By: Dorise Bullion III M.D.   On: 10/15/2021 15:29   EEG adult  Result Date: 10/16/2021 Lora Havens, MD     10/16/2021 10:39 AM Patient Name: ANNASTACIA DUBA MRN: [id number] Epilepsy Attending: Lora Havens Referring Physician/Provider: August Albino, NP Date: 10/16/2021 Duration: 23.03 mins Patient history: 86 y.o. female with past medical history of Cerebral AV malformation, HTN, HLD, seizures disorder, CAD who presents today to the [facility] for evaluation of LOC. EEG to evaluate for seizure Level of alertness: Awake AEDs during EEG study: PHT, Phenobarb Technical aspects: This EEG study was done with scalp electrodes positioned according to the 10-20 International system of electrode placement. Electrical activity was acquired at a sampling rate of '500Hz'$  and reviewed with a high frequency filter of '70Hz'$  and a low frequency filter of '1Hz'$ . EEG data were recorded continuously and digitally stored. Description: The posterior dominant rhythm consists of 8 Hz activity of moderate voltage (25-35 uV) seen predominantly in posterior head regions, symmetric and reactive to eye opening and eye closing. Physiologic photic driving was  not seen during photic stimulation. Hyperventilation was not performed.   IMPRESSION: This study is within normal limits. No seizures or epileptiform discharges were seen throughout the recording. Lora Havens   ECHOCARDIOGRAM COMPLETE  Result Date: 10/16/2021    ECHOCARDIOGRAM REPORT   Patient Name:   LYLE LEISNER Date of Exam: 10/16/2021 Medical Rec #:  [id number]        Height:       65.0 in Accession #:    [id number]       Weight:       142.2 lb Date of Birth:  [birth year]        BSA:          39.711 m Patient Age:    42 years         BP:           140/64 mmHg Patient Gender: F                HR:           77 bpm. Exam Location:  Inpatient Procedure: 2D Echo, Cardiac Doppler and Color Doppler Indications:    Syncope  History:        Patient has no prior history of Echocardiogram examinations.                 Signs/Symptoms:Murmur; Risk Factors:Dyslipidemia.  Sonographer:    Luisa Hart RDCS Referring Phys: [id number]  1. Left ventricular ejection fraction, by estimation, is 45 to 50%. The left ventricle has mildly decreased function. The left ventricle demonstrates global hypokinesis. There is mild left ventricular hypertrophy. Left ventricular diastolic parameters are indeterminate.  2. Right ventricular systolic function is normal. The right ventricular size is normal.  3. Left atrial size was moderately dilated.  4. The pericardial effusion is posterior to the left  ventricle.  5. The mitral valve is normal in structure. Mild mitral valve regurgitation. No evidence of mitral stenosis.  6. The aortic valve is tricuspid. Aortic valve regurgitation is not visualized. No aortic stenosis is present.  7. The inferior vena cava is normal in size with greater than 50% respiratory variability, suggesting right atrial pressure of 3 mmHg. FINDINGS  Left Ventricle: Left ventricular ejection fraction, by estimation, is 45 to 50%. The left ventricle has mildly decreased function. The  left ventricle demonstrates global hypokinesis. The left ventricular internal cavity size was normal in size. There is  mild left ventricular hypertrophy. Left ventricular diastolic parameters are indeterminate. Right Ventricle: The right ventricular size is normal. No increase in right ventricular wall thickness. Right ventricular systolic function is normal. Left Atrium: Left atrial size was moderately dilated. Right Atrium: Right atrial size was normal in size. Pericardium: Trivial pericardial effusion is present. The pericardial effusion is posterior to the left ventricle. Mitral Valve: The mitral valve is normal in structure. Mild mitral valve regurgitation. No evidence of mitral valve stenosis. Tricuspid Valve: The tricuspid valve is normal in structure. Tricuspid valve regurgitation is not demonstrated. No evidence of tricuspid stenosis. Aortic Valve: The aortic valve is tricuspid. Aortic valve regurgitation is not visualized. No aortic stenosis is present. Aortic valve mean gradient measures 5.7 mmHg. Aortic valve peak gradient measures 9.8 mmHg. Aortic valve area, by VTI measures 1.83 cm. Pulmonic Valve: The pulmonic valve was normal in structure. Pulmonic valve regurgitation is trivial. No evidence of pulmonic stenosis. Aorta: The aortic root is normal in size and structure. Venous: The inferior vena cava is normal in size with greater than 50% respiratory variability, suggesting right atrial pressure of 3 mmHg. IAS/Shunts: No atrial level shunt detected by color flow Doppler.  LEFT VENTRICLE PLAX 2D LVIDd:         4.90 cm LVIDs:         3.70 cm LV PW:         1.20 cm LV IVS:        1.00 cm LVOT diam:     1.90 cm LV SV:         71 LV SV Index:   42 LVOT Area:     2.84 cm  LV Volumes (MOD) LV vol d, MOD A2C: 99.4 ml LV vol d, MOD A4C: 82.9 ml LV vol s, MOD A2C: 44.1 ml LV vol s, MOD A4C: 54.6 ml LV SV MOD A2C:     55.3 ml LV SV MOD A4C:     82.9 ml LV SV MOD BP:      39.7 ml RIGHT VENTRICLE RV Basal diam:   1.60 cm RV Mid diam:    2.10 cm LEFT ATRIUM              Index        RIGHT ATRIUM           Index LA diam:        3.70 cm  2.16 cm/m   RA Area:     16.10 cm LA Vol (A2C):   111.0 ml 64.87 ml/m  RA Volume:   36.30 ml  21.21 ml/m LA Vol (A4C):   84.0 ml  49.09 ml/m LA Biplane Vol: 98.4 ml  57.50 ml/m  AORTIC VALVE                     PULMONIC VALVE AV Area (Vmax):    1.79 cm  PV Vmax:          1.18 m/s AV Area (Vmean):   1.66 cm      PV Vmean:         83.450 cm/s AV Area (VTI):     1.83 cm      PV VTI:           0.290 m AV Vmax:           156.67 cm/s   PV Peak grad:     5.6 mmHg AV Vmean:          110.667 cm/s  PV Mean grad:     3.5 mmHg AV VTI:            0.389 m       PR End Diast Vel: 3.91 msec AV Peak Grad:      9.8 mmHg AV Mean Grad:      5.7 mmHg LVOT Vmax:         99.10 cm/s LVOT Vmean:        64.800 cm/s LVOT VTI:          0.251 m LVOT/AV VTI ratio: 0.64  AORTA Ao Root diam: 2.80 cm MR Peak grad: 145.9 mmHg MR Vmax:      604.00 cm/s  SHUNTS MV E velocity: 88.50 cm/s  Systemic VTI:  0.25 m                            Systemic Diam: 1.90 cm Candee Furbish MD Electronically signed by Candee Furbish MD Signature Date/Time: 10/16/2021/12:55:43 PM    Final    VAS US CAROTID  Result Date: 10/17/2021 Carotid Arterial Duplex Study Patient Name:  PACEY WILLADSEN  Date of Exam:   10/17/2021 Medical Rec #: [id number]         Accession #:    [id number] Date of Birth: 1936-04-28         Patient Gender: F Patient Age:   3 years Exam Location:  [facility] Procedure:      VAS US CAROTID Referring Phys: Shawna Orleans CHRISTOPHER --------------------------------------------------------------------------------  Indications:       Bilateral bruits and Carotid artery disease. Risk Factors:      Hypertension, hyperlipidemia. Comparison Study:  01/05/2021 Carotid artery duplex- Right Carotid: Velocities in                    the right ICA are consistent with a 40-59% stenosis. The ECA                    appears >50%  stenosed.                     Left Carotid: Velocities in the left ICA are consistent with                    a 40-59%                    stenosis. Non-hemodynamically significant plaque <50% noted                    in the CCA. The ECA appears >50% stenosed.                     Vertebrals: Bilateral vertebral arteries demonstrate  antegrade flow. Turbulent flow noted in the left vertebral                    artery.                    Subclavians: Normal flow hemodynamics were seen in bilateral                    subclavian arteries. Performing Technologist: Maudry Mayhew MHA, RDMS, RVT, RDCS  Examination Guidelines: A complete evaluation includes B-mode imaging, spectral Doppler, color Doppler, and power Doppler as needed of all accessible portions of each vessel. Bilateral testing is considered an integral part of a complete examination. Limited examinations for reoccurring indications may be performed as noted.  Right Carotid Findings: +----------+--------+--------+--------+--------------------+-------------------+           PSV cm/sEDV cm/sStenosisPlaque Description  Comments            +----------+--------+--------+--------+--------------------+-------------------+ CCA Prox  100     28                                                      +----------+--------+--------+--------+--------------------+-------------------+ CCA Distal156     47                                  turbulence          +----------+--------+--------+--------+--------------------+-------------------+ ICA Prox  250     83      60-79%  calcific            Shadowing           +----------+--------+--------+--------+--------------------+-------------------+ ICA Mid   211     55                                  post stenotic                                                             turbulence          +----------+--------+--------+--------+--------------------+-------------------+  ICA Distal148     34                                  tortuous            +----------+--------+--------+--------+--------------------+-------------------+ ECA       426             >50%    heterogenous,       shadowing                                             irregular and  calcific                                +----------+--------+--------+--------+--------------------+-------------------+ +----------+--------+-------+----------------+-------------------+           PSV cm/sEDV cmsDescribe        Arm Pressure (mmHG) +----------+--------+-------+----------------+-------------------+ [id number]            Multiphasic, WNL                    +----------+--------+-------+----------------+-------------------+ +---------+--------+---+--------+--+-----------------------+ VertebralPSV cm/s130EDV cm/s33Antegrade and turbulent +---------+--------+---+--------+--+-----------------------+  Left Carotid Findings: +----------+--------+--------+--------+-------------------------+---------+           PSV cm/sEDV cm/sStenosisPlaque Description       Comments  +----------+--------+--------+--------+-------------------------+---------+ CCA Prox  131     36                                                 +----------+--------+--------+--------+-------------------------+---------+ CCA Distal121     32              calcific                 Shadowing +----------+--------+--------+--------+-------------------------+---------+ ICA Prox  194     74      60-79%  heterogenous and calcificShadowing +----------+--------+--------+--------+-------------------------+---------+ ICA Mid   187     61                                                 +----------+--------+--------+--------+-------------------------+---------+ ICA Distal139     51                                                  +----------+--------+--------+--------+-------------------------+---------+ ECA       421     30      >50%    heterogenous and calcificshadowing +----------+--------+--------+--------+-------------------------+---------+ +----------+--------+--------+----------------+-------------------+           PSV cm/sEDV cm/sDescribe        Arm Pressure (mmHG) +----------+--------+--------+----------------+-------------------+ [id number]             Multiphasic, WNL                    +----------+--------+--------+----------------+-------------------+ +---------+--------+---+--------+--+-----------------------+ VertebralPSV cm/s142EDV cm/s42Antegrade and turbulent +---------+--------+---+--------+--+-----------------------+   Summary: Right Carotid: Velocities in the right ICA are consistent with a 60-79%                stenosis. The ECA appears >50% stenosed. When compared to prior                study. Left Carotid: Velocities in the left ICA are consistent with a 60-79% stenosis.               The ECA appears >50% stenosed. Vertebrals:  Antegrade and turbulent bilaterally. Subclavians: Normal flow hemodynamics were seen in bilateral subclavian              arteries. *See table(s) above for measurements and observations.  Electronically signed by Antony Contras MD on 10/17/2021 at 11:39:07 AM.    Final     Assessment/Plan  1.  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 S/P placement of cardiac pacemaker Pacemaker prec F/u with cardiology Remove transmission performed today Here for therapy and assistance in self care  3. Essential hypertension Cardiology mentioned starting metoprolol, will await f/u unless bp increases  4. Memory loss Needs MMSE AL appropriate once recovered from this   5. Cerebral AV malformation On preventative meds for seizures  6. Mixed hyperlipidemia On zocor  7. Anemia of chronic disease Lab Results  Component Value Date   HGB 9.9 (A) 11/09/2021  Continue to  monitor Iron studies were normal in the past  8. Hypomagnesemia  - Magnesium Oxide 400 MG CAPS; Take 1 capsule (400 mg total) by mouth daily.  Dispense: 5 capsule; Refill: -0   Discussed with resident that once she is off rehab she would like to continue care with Dr. Reynaldo Minium

## 2021-11-10 ENCOUNTER — Telehealth: Payer: Self-pay

## 2021-11-10 NOTE — Telephone Encounter (Signed)
Incoming call from [facility] staff reporting patient has "fever" of 100.1 and doesn't feel well. Patient s/p PPM implant 11/07/21. Per staff nurse outer dressing was removed yesterday. Steristrips remain intact with no s/s of infection including drainage, redness or swelling at site of implant. Advised that Dr. Lovena Le would be notified however would recommend looking for additional sources of infection if temp remained elevated or increased. Advised if site began to drain or redness develops along with increase in temp patient should present to ED for evaluation if over the weekend. Discussed recommendations given by device RN with [name] who agrees with advisement.

## 2021-11-13 ENCOUNTER — Non-Acute Institutional Stay (SKILLED_NURSING_FACILITY): Payer: Medicare Other | Admitting: Internal Medicine

## 2021-11-13 ENCOUNTER — Encounter: Payer: Self-pay | Admitting: Internal Medicine

## 2021-11-13 DIAGNOSIS — I459 Conduction disorder, unspecified: Secondary | ICD-10-CM | POA: Diagnosis not present

## 2021-11-13 DIAGNOSIS — I10 Essential (primary) hypertension: Secondary | ICD-10-CM | POA: Diagnosis not present

## 2021-11-13 DIAGNOSIS — D638 Anemia in other chronic diseases classified elsewhere: Secondary | ICD-10-CM

## 2021-11-13 DIAGNOSIS — Z950 Presence of cardiac pacemaker: Secondary | ICD-10-CM | POA: Diagnosis not present

## 2021-11-13 DIAGNOSIS — E782 Mixed hyperlipidemia: Secondary | ICD-10-CM | POA: Diagnosis not present

## 2021-11-13 DIAGNOSIS — R278 Other lack of coordination: Secondary | ICD-10-CM | POA: Diagnosis not present

## 2021-11-13 DIAGNOSIS — R2689 Other abnormalities of gait and mobility: Secondary | ICD-10-CM | POA: Diagnosis not present

## 2021-11-13 DIAGNOSIS — M5136 Other intervertebral disc degeneration, lumbar region: Secondary | ICD-10-CM | POA: Diagnosis not present

## 2021-11-13 DIAGNOSIS — M7989 Other specified soft tissue disorders: Secondary | ICD-10-CM

## 2021-11-13 DIAGNOSIS — M4807 Spinal stenosis, lumbosacral region: Secondary | ICD-10-CM | POA: Diagnosis not present

## 2021-11-13 NOTE — Progress Notes (Signed)
Provider:  [place] Location:   [facility], [place] Room Number: 209 Place of Service:  SNF ([phone])  PCP: Virgie Dad, MD Patient Care Team: Virgie Dad, MD as PCP - General (Internal Medicine) Martinique, [name], MD as PCP - Cardiology (Cardiology)  Extended Emergency Contact Information Primary Emergency Contact: [name],[name] Mobile Phone: [phone] Relation: Daughter Secondary Emergency Contact: JENICKA, COXE Mobile Phone: [phone] Relation: Son  Code Status: DNR Goals of Care: Advanced Directive information    11/13/2021    9:16 AM  Advanced Directives  Does Patient Have a Medical Advance Directive? Yes  Type of Advance Directive Out of facility DNR (pink MOST or yellow form);Living will  Does patient want to make changes to medical advance directive? No - Patient declined  Pre-existing out of facility DNR order (yellow form or pink MOST form) Yellow form placed in chart (order not valid for inpatient use)      Chief Complaint  Patient presents with  . New Admit To SNF    Admission to SNF    HPI: Patient is a 86 y.o. female seen today for Readmission to  Past Medical History:  Diagnosis Date  . Abnormal cardiovascular stress test   . Carotid arterial disease (HCC)    MODERATE RIGHT  . Cerebral AV malformation   . DDD (degenerative disc disease)    SEVERE WITH SPINAL STENOSIS  . Hypercholesterolemia   . Hypertension   . MVP (mitral valve prolapse)    MILD  . Numbness    LEFT FOOT AND ANKLE  . Osteopenia   . Pseudo-gout    Past Surgical History:  Procedure Laterality Date  . APPENDECTOMY    . BREAST MASS EXCISION    . CARPAL TUNNEL RELEASE    . PACEMAKER IMPLANT N/A 11/07/2021   Procedure: PACEMAKER IMPLANT;  Surgeon: Evans Lance, MD;  Location: [facility];  Service: Cardiovascular;  Laterality: N/A;  . REMOVAL OF PARATHYROID GLAND    . STRESS NUCLEAR STUDY     ABNORMAL  . TONSILLECTOMY       reports that she quit smoking about 53 years ago. Her smoking use included cigarettes. She has a 14.00 pack-year smoking history. She has never used smokeless tobacco. She reports that she does not drink alcohol and does not use drugs. Social History   Socioeconomic History  . Marital status: Widowed    Spouse name: Not on file  . Number of children: 3  . Years of education: Not on file  . Highest education level: Not on file  Occupational History  . Not on file  Tobacco Use  . Smoking status: Former    Packs/day: 1.00    Years: 14.00    Pack years: 14.00    Types: Cigarettes    Quit date: 11/07/1968    Years since quitting: 53.0  . Smokeless tobacco: Never  Vaping Use  . Vaping Use: Never used  Substance and Sexual Activity  . Alcohol use: No  . Drug use: No  . Sexual activity: Not on file  Other Topics Concern  . Not on file  Social History Narrative   [place] home [place] Strain: Not on file  Food Insecurity: Not on file  Transportation Needs: Not on file  Physical Activity: Not on file  Stress: Not on file  Social Connections: Not on file  Intimate Partner Violence: Not on file  Functional Status Survey:    Family History  Problem Relation Age of Onset  . Stroke Mother   . Lung cancer Father   . Breast cancer Neg Hx     Health Maintenance  Topic Date Due  . TETANUS/TDAP  Never done  . DEXA SCAN  Never done  . Pneumonia Vaccine 103+ Years old (2 - PPSV23 if available, else PCV20) 11/26/2015  . COVID-19 Vaccine (6 - Booster for Moderna series) 06/23/2021  . INFLUENZA VACCINE  01/09/2022  . Zoster Vaccines- Shingrix  Completed  . HPV VACCINES  Aged Out    Allergies  Allergen Reactions  . Azithromycin Other (See Comments)    Reaction not recalled  . Penicillins Hives and Other (See Comments)    Bad reaction as a child after a shot of Penicillin  . Pravastatin Other (See Comments)     Reaction not recalled  . Sulfa Drugs Cross Reactors Other (See Comments)    Reaction not recalled- stated she can take it "in small doses"  . Tape Itching and Other (See Comments)    Cannot wear for an extended period of time  . Naproxen Sodium Rash    Allergies as of 11/13/2021       Reactions   Azithromycin Other (See Comments)   Reaction not recalled   Penicillins Hives, Other (See Comments)   Bad reaction as a child after a shot of Penicillin   Pravastatin Other (See Comments)   Reaction not recalled   Sulfa Drugs Cross Reactors Other (See Comments)   Reaction not recalled- stated she can take it "in small doses"   Tape Itching, Other (See Comments)   Cannot wear for an extended period of time   Naproxen Sodium Rash        Medication List        Accurate as of November 13, 2021  9:56 AM. If you have any questions, ask your nurse or doctor.          acetaminophen 500 MG tablet Commonly known as: TYLENOL Take 500 mg by mouth every 6 (six) hours as needed for mild pain.   fluticasone 50 MCG/ACT nasal spray Commonly known as: FLONASE Place 1-2 sprays into both nostrils daily as needed for allergies or rhinitis.   loperamide 2 MG tablet Commonly known as: IMODIUM A-D Take 1 tablet (2 mg total) by mouth as needed for diarrhea or loose stools.   Magnesium Oxide 400 MG Caps Take 1 capsule (400 mg total) by mouth daily.   Multi Complete/Iron Tabs Take 1 tablet by mouth daily with breakfast.   PHENobarbital 32.4 MG tablet Commonly known as: LUMINAL Take 2 tablets (64.8 mg total) by mouth daily with breakfast.   phenytoin 100 MG ER capsule Commonly known as: DILANTIN Take 200 mg by mouth daily with breakfast.   simvastatin 20 MG tablet Commonly known as: ZOCOR Take 20 mg by mouth at bedtime.   Systane Ultra PF 0.4-0.3 % Soln Generic drug: Polyethyl Glyc-Propyl Glyc PF Place 1 drop into both eyes in the morning and at bedtime.   Vitamin-B Complex Tabs Take 1  tablet by mouth daily.        Review of Systems  Vitals:   11/13/21 0857  BP: (!) 155/64  Pulse: 96  Resp: 18  Temp: 98.9 F (37.2 C)  SpO2: 97%  Weight: 136 lb 6.4 oz (61.9 kg)  Height: '5\' 5"'$  (1.651 m)   Body mass index is 22.7 kg/m. Physical Exam Vitals reviewed.  Constitutional:      Appearance: Normal appearance.  HENT:     Head: Normocephalic.     Nose: Nose normal.     Mouth/Throat:     Mouth: Mucous membranes are moist.     Pharynx: Oropharynx is clear.  Eyes:     Pupils: Pupils are equal, round, and reactive to light.  Cardiovascular:     Rate and Rhythm: Normal rate and regular rhythm.     Pulses: Normal pulses.     Heart sounds: Normal heart sounds. No murmur heard. Pulmonary:     Effort: Pulmonary effort is normal.     Breath sounds: Normal breath sounds.  Abdominal:     General: Abdomen is flat. Bowel sounds are normal.     Palpations: Abdomen is soft.  Musculoskeletal:     Cervical back: Neck supple.     Comments: Left Arm swelling  Skin:    General: Skin is warm.  Neurological:     General: No focal deficit present.     Mental Status: She is alert and oriented to person, place, and time.  Psychiatric:        Mood and Affect: Mood normal.        Thought Content: Thought content normal.    Labs reviewed: Basic Metabolic Panel: Recent Labs    04/14/21 0338 04/17/21 0000 04/25/21 0000 10/15/21 1456 10/16/21 0110 10/17/21 0037  NA 131* 134*  134*   < > 141 140 140  K 3.8 4.0  4.0   < > 4.6 4.2 4.0  CL 98 96*  96*   < > 108 110 109  CO2 25 26*  26*   < > '24 24 24  '$ GLUCOSE 111*  --    < > 122* 119* 103*  BUN 28* 24*  24*   < > 35* 35* 28*  CREATININE 0.87 0.9  0.9   < > 1.41* 1.33* 1.33*  CALCIUM 8.4* 8.9  8.9   < > 9.1 8.5* 8.5*  MG 1.4* 1.7  --   --   --  1.4*   < > = values in this interval not displayed.   Liver Function Tests: Recent Labs    05/10/21 1113 10/15/21 1456 10/16/21 0110  AST 19 38 29  ALT 12 33 26   ALKPHOS 47 61 52  BILITOT 0.3 0.6 0.5  PROT 6.7 6.2* 5.5*  ALBUMIN 3.7 3.4* 3.1*   Recent Labs    04/10/21 1027 10/15/21 1456  LIPASE 61* 54*   No results for input(s): AMMONIA in the last 8760 hours. CBC: Recent Labs    05/10/21 1113 06/21/21 1430 10/15/21 1456 10/16/21 0110 11/09/21 0000  WBC 5.3 7.8 5.6 5.7 8.3  NEUTROABS 3.8 5,686 4.2  --   --   HGB 9.5* 10.6* 9.1* 8.4* 9.9*  HCT 27.6* 30.9* 28.1* 26.0* 29*  MCV 97.9 96.9 104.9* 104.0*  --   PLT 249.0 288 172 148* 158   Cardiac Enzymes: No results for input(s): CKTOTAL, CKMB, CKMBINDEX, TROPONINI in the last 8760 hours. BNP: Invalid input(s): POCBNP No results found for: HGBA1C Lab Results  Component Value Date   TSH 1.685 07/21/2020   Lab Results  Component Value Date   VITAMINB12 1,311 (H) 07/24/2020   Lab Results  Component Value Date   FOLATE 13.5 07/24/2020   Lab Results  Component Value Date   IRON 96 05/10/2021   TIBC 218.4 (L) 05/10/2021   FERRITIN 232.7 05/10/2021    Imaging and  Procedures obtained prior to SNF admission: DG Chest 2 View  Result Date: 11/07/2021 CLINICAL DATA:  Status post pacemaker insertion EXAM: CHEST - 2 VIEW COMPARISON:  10/15/2021 FINDINGS: Lungs are clear.  No pleural effusion or pneumothorax. The heart is normal in size. Thoracic aortic atherosclerosis. Left subclavian dual lead pacemaker, in satisfactory position. Visualized osseous structures are within normal limits. IMPRESSION: Left subclavian dual lead pacemaker, in satisfactory position. Electronically Signed   By: Julian Hy M.D.   On: 11/07/2021 17:39   EP PPM/ICD IMPLANT  Result Date: 11/07/2021 CONCLUSIONS:  1. Successful implantation of a Medtronic dual-chamber pacemaker for symptomatic bradycardia due to intermittent CHB  2. No early apparent complications.       Cristopher Peru, MD 11/07/2021 2:52 PM     Assessment/Plan 1. S/P placement of cardiac pacemaker ***  2. Stokes-Adams syncope ***  3.  Essential hypertension ***  4. Mixed hyperlipidemia ***  5. Anemia of chronic disease Iorn studies done in  6 Left arm swelling Post op    Family/ staff Communication:   Labs/tests ordered:

## 2021-11-14 ENCOUNTER — Telehealth: Payer: Self-pay

## 2021-11-14 DIAGNOSIS — M4807 Spinal stenosis, lumbosacral region: Secondary | ICD-10-CM | POA: Diagnosis not present

## 2021-11-14 DIAGNOSIS — R278 Other lack of coordination: Secondary | ICD-10-CM | POA: Diagnosis not present

## 2021-11-14 DIAGNOSIS — M5136 Other intervertebral disc degeneration, lumbar region: Secondary | ICD-10-CM | POA: Diagnosis not present

## 2021-11-14 DIAGNOSIS — R2689 Other abnormalities of gait and mobility: Secondary | ICD-10-CM | POA: Diagnosis not present

## 2021-11-14 DIAGNOSIS — Z950 Presence of cardiac pacemaker: Secondary | ICD-10-CM | POA: Diagnosis not present

## 2021-11-14 NOTE — Telephone Encounter (Signed)
CareAlert for AF > 0.5hr/24hrs. Presenting rhythm AF with Vs/Vp (controlled rates). New onset, 11/14/21 @ 0615. New PPM implant 11/07/21.  No OAC on file. Please advise Dr. Lovena Le.

## 2021-11-15 DIAGNOSIS — R2689 Other abnormalities of gait and mobility: Secondary | ICD-10-CM | POA: Diagnosis not present

## 2021-11-15 DIAGNOSIS — R278 Other lack of coordination: Secondary | ICD-10-CM | POA: Diagnosis not present

## 2021-11-15 DIAGNOSIS — M5136 Other intervertebral disc degeneration, lumbar region: Secondary | ICD-10-CM | POA: Diagnosis not present

## 2021-11-15 DIAGNOSIS — Z950 Presence of cardiac pacemaker: Secondary | ICD-10-CM | POA: Diagnosis not present

## 2021-11-15 DIAGNOSIS — M4807 Spinal stenosis, lumbosacral region: Secondary | ICD-10-CM | POA: Diagnosis not present

## 2021-11-16 DIAGNOSIS — R278 Other lack of coordination: Secondary | ICD-10-CM | POA: Diagnosis not present

## 2021-11-16 DIAGNOSIS — M5136 Other intervertebral disc degeneration, lumbar region: Secondary | ICD-10-CM | POA: Diagnosis not present

## 2021-11-16 DIAGNOSIS — Z950 Presence of cardiac pacemaker: Secondary | ICD-10-CM | POA: Diagnosis not present

## 2021-11-16 DIAGNOSIS — M6389 Disorders of muscle in diseases classified elsewhere, multiple sites: Secondary | ICD-10-CM | POA: Diagnosis not present

## 2021-11-16 DIAGNOSIS — M4807 Spinal stenosis, lumbosacral region: Secondary | ICD-10-CM | POA: Diagnosis not present

## 2021-11-16 DIAGNOSIS — R2689 Other abnormalities of gait and mobility: Secondary | ICD-10-CM | POA: Diagnosis not present

## 2021-11-16 NOTE — Telephone Encounter (Signed)
Attempted to contact patient to advise Dr. Forde Dandy recommendations of AF clinic to start [facility]. No answer, No VM available.

## 2021-11-17 DIAGNOSIS — M6389 Disorders of muscle in diseases classified elsewhere, multiple sites: Secondary | ICD-10-CM | POA: Diagnosis not present

## 2021-11-17 DIAGNOSIS — R2689 Other abnormalities of gait and mobility: Secondary | ICD-10-CM | POA: Diagnosis not present

## 2021-11-17 DIAGNOSIS — M4807 Spinal stenosis, lumbosacral region: Secondary | ICD-10-CM | POA: Diagnosis not present

## 2021-11-17 DIAGNOSIS — Z950 Presence of cardiac pacemaker: Secondary | ICD-10-CM | POA: Diagnosis not present

## 2021-11-17 DIAGNOSIS — R278 Other lack of coordination: Secondary | ICD-10-CM | POA: Diagnosis not present

## 2021-11-17 DIAGNOSIS — M5136 Other intervertebral disc degeneration, lumbar region: Secondary | ICD-10-CM | POA: Diagnosis not present

## 2021-11-20 DIAGNOSIS — R278 Other lack of coordination: Secondary | ICD-10-CM | POA: Diagnosis not present

## 2021-11-20 DIAGNOSIS — M4807 Spinal stenosis, lumbosacral region: Secondary | ICD-10-CM | POA: Diagnosis not present

## 2021-11-20 DIAGNOSIS — Z950 Presence of cardiac pacemaker: Secondary | ICD-10-CM | POA: Diagnosis not present

## 2021-11-20 DIAGNOSIS — M5136 Other intervertebral disc degeneration, lumbar region: Secondary | ICD-10-CM | POA: Diagnosis not present

## 2021-11-20 DIAGNOSIS — R2689 Other abnormalities of gait and mobility: Secondary | ICD-10-CM | POA: Diagnosis not present

## 2021-11-20 DIAGNOSIS — M6389 Disorders of muscle in diseases classified elsewhere, multiple sites: Secondary | ICD-10-CM | POA: Diagnosis not present

## 2021-11-21 ENCOUNTER — Encounter: Payer: Self-pay | Admitting: Nurse Practitioner

## 2021-11-21 ENCOUNTER — Other Ambulatory Visit: Payer: Self-pay

## 2021-11-21 ENCOUNTER — Ambulatory Visit (INDEPENDENT_AMBULATORY_CARE_PROVIDER_SITE_OTHER): Payer: Medicare Other | Admitting: Nurse Practitioner

## 2021-11-21 VITALS — BP 146/57 | HR 86 | Ht 65.0 in | Wt 141.0 lb

## 2021-11-21 DIAGNOSIS — R55 Syncope and collapse: Secondary | ICD-10-CM

## 2021-11-21 DIAGNOSIS — I4510 Unspecified right bundle-branch block: Secondary | ICD-10-CM

## 2021-11-21 DIAGNOSIS — M6389 Disorders of muscle in diseases classified elsewhere, multiple sites: Secondary | ICD-10-CM | POA: Diagnosis not present

## 2021-11-21 DIAGNOSIS — Z950 Presence of cardiac pacemaker: Secondary | ICD-10-CM | POA: Diagnosis not present

## 2021-11-21 DIAGNOSIS — Z8669 Personal history of other diseases of the nervous system and sense organs: Secondary | ICD-10-CM | POA: Diagnosis not present

## 2021-11-21 DIAGNOSIS — I442 Atrioventricular block, complete: Secondary | ICD-10-CM

## 2021-11-21 DIAGNOSIS — E785 Hyperlipidemia, unspecified: Secondary | ICD-10-CM

## 2021-11-21 DIAGNOSIS — D649 Anemia, unspecified: Secondary | ICD-10-CM

## 2021-11-21 DIAGNOSIS — I459 Conduction disorder, unspecified: Secondary | ICD-10-CM | POA: Diagnosis not present

## 2021-11-21 DIAGNOSIS — I10 Essential (primary) hypertension: Secondary | ICD-10-CM

## 2021-11-21 DIAGNOSIS — R2689 Other abnormalities of gait and mobility: Secondary | ICD-10-CM | POA: Diagnosis not present

## 2021-11-21 DIAGNOSIS — I480 Paroxysmal atrial fibrillation: Secondary | ICD-10-CM | POA: Diagnosis not present

## 2021-11-21 DIAGNOSIS — I6523 Occlusion and stenosis of bilateral carotid arteries: Secondary | ICD-10-CM

## 2021-11-21 DIAGNOSIS — M4807 Spinal stenosis, lumbosacral region: Secondary | ICD-10-CM | POA: Diagnosis not present

## 2021-11-21 DIAGNOSIS — R278 Other lack of coordination: Secondary | ICD-10-CM | POA: Diagnosis not present

## 2021-11-21 DIAGNOSIS — M5136 Other intervertebral disc degeneration, lumbar region: Secondary | ICD-10-CM | POA: Diagnosis not present

## 2021-11-21 MED ORDER — VALSARTAN 80 MG PO TABS: 80.0000 mg | ORAL_TABLET | Freq: Every day | ORAL | 3 refills | Status: DC

## 2021-11-21 NOTE — Patient Instructions (Addendum)
Medication Instructions:  Start Valsartan 80 mg daily  *If you need a refill on your cardiac medications before your next appointment, please call your pharmacy*   Lab Work: Your physician recommends that you return for lab work in 2 weeks.   If you have labs (blood work) drawn today and your tests are completely normal, you will receive your results only by: [place] (if you have MyChart) OR A paper copy in the mail If you have any lab test that is abnormal or we need to change your treatment, we will call you to review the results.   Testing/Procedures: NONE ordered at this time of appointment     Follow-Up: At [facility], you and your health needs are our priority.  As part of our continuing mission to provide you with exceptional heart care, we have created designated Provider Care Teams.  These Care Teams include your primary Cardiologist (physician) and Advanced Practice Providers (APPs -  Physician Assistants and Nurse Practitioners) who all work together to provide you with the care you need, when you need it.  We recommend signing up for the patient portal called "MyChart".  Sign up information is provided on this After Visit Summary.  MyChart is used to connect with patients for Virtual Visits (Telemedicine).  Patients are able to view lab/test results, encounter notes, upcoming appointments, etc.  Non-urgent messages can be sent to your provider as well.   To learn more about what you can do with MyChart, go to [email or web address].    Your next appointment:    Keep Upcoming appointments   The format for your next appointment:   In Person  Provider:   Peter Martinique, MD     Other Instructions Monitor blood pressure. Report blood pressure consistently greater than 130/80  Referred to the [facility]   Important Information About Sugar

## 2021-11-21 NOTE — Progress Notes (Signed)
Office Visit    Patient Name: [name] Date of Encounter: 11/21/2021  Primary Care Provider:  Virgie Dad, MD Primary Cardiologist:  Peter Martinique, MD  Chief Complaint    86 year old female with a history of recurrent syncope (Stokes-Adams attack), complete heart block s/p PPM, RBBB, paroxysmal atrial fibrillation, mitral valve prolapse, hypertension, hyperlipidemia, carotid artery disease, DDD, cerebral AV malformation, complex partial seizures, memory impairment, iron deficiency anemia, and osteopenia who presents for follow-up related to heart block, hypertension, and atrial fibrillation.  Past Medical History    Past Medical History:  Diagnosis Date   Abnormal cardiovascular stress test    Carotid arterial disease (HCC)    MODERATE RIGHT   Cerebral AV malformation    DDD (degenerative disc disease)    SEVERE WITH SPINAL STENOSIS   Hypercholesterolemia    Hypertension    MVP (mitral valve prolapse)    MILD   Numbness    LEFT FOOT AND ANKLE   Osteopenia    Pseudo-gout    Past Surgical History:  Procedure Laterality Date   APPENDECTOMY     BREAST MASS EXCISION     CARPAL TUNNEL RELEASE     PACEMAKER IMPLANT N/A 11/07/2021   Procedure: PACEMAKER IMPLANT;  Surgeon: Evans Lance, MD;  Location: [facility];  Service: Cardiovascular;  Laterality: N/A;   REMOVAL OF PARATHYROID GLAND     STRESS NUCLEAR STUDY     ABNORMAL   TONSILLECTOMY      Allergies  Allergies  Allergen Reactions   Azithromycin Other (See Comments)    Reaction not recalled   Penicillins Hives and Other (See Comments)    Bad reaction as a child after a shot of Penicillin   Pravastatin Other (See Comments)    Reaction not recalled   Sulfa Drugs Cross Reactors Other (See Comments)    Reaction not recalled- stated she can take it "in small doses"   Tape Itching and Other (See Comments)    Cannot wear for an extended period of time   Naproxen Sodium Rash    History of  Present Illness    86 year old female with the above past medical history including recurrent syncope (Stokes-Adams attack), complete heart block s/p PPM, RBBB, paroxysmal atrial fibrillation, mitral valve prolapse, hypertension, hyperlipidemia, carotid artery disease, DDD, cerebral AV malformation, complex partial seizures, memory impairment, iron deficiency anemia, and osteopenia.  She has a history of mildly abnormal Myoview study in 2010.  Not on ASA due to history of cerebral AV malformation.  She lives at [facility] assisted [facility].  She history of recurrent syncope.  She was hospitalized in February 2022 with syncope, confusion, hyponatremia following diarrheal illness.  Carotid Dopplers in July 2022 showed 40 to 59% B ICA stenosis, stable from prior study.   She presented to the ED on 10/15/2021 with syncope.  Echocardiogram showed EF 45 to 50%, LVH, normal RV, moderately dilated left, mild MR, pericardial effusion.  Telemetry monitoring showed frequent PVCs.  Radiology and neurology were consulted.  EEG was negative for seizure activity, UA was negative for UTI.  It was mildly elevated with low suspicion for ACS.  She was hospitalized from 10/15/2021 to 10/17/2021.  She was discharged to assisted living with outpatient 14-day live [facility] patch revealed intermittent CHB, says lasting up to 10 seconds.  She was last in the office on 11/03/2021 by Dr. Lovena Le who recommended PPM for complete heart block.  She underwent PPM implantation on 11/07/2021 with Dr. Lovena Le.  She was discharged to skilled nursing facility on 11/07/2021.  The facility notified us of low-grade fevers over the weekend following her procedure, left arm swelling.  Her staff nurse, PPM was without signs of infection. PPM remote transmission on 11/14/2021 alerted for new onset atrial fibrillation. Per Dr. Lovena Le, she was to be referred to the atrial fibrillation clinic.  She presents today for follow-up. Since her procedure she has been stable from  a cardiac standpoint.  She feels reports feeling well. Denies any recurrent syncope, presyncope.  She denies dyspnea, palpitations, dizziness, lightheadedness, denies symptoms concerning for angina.  She states she is getting ready to transition to assisted living at [facility].  Overall, she reports feeling well and denies any new concerns today.  Home Medications    Current Outpatient Medications  Medication Sig Dispense Refill   acetaminophen (TYLENOL) 500 MG tablet Take 500 mg by mouth every 6 (six) hours as needed for mild pain.     B Complex Vitamins (VITAMIN-B COMPLEX) TABS Take 1 tablet by mouth daily.     fluticasone (FLONASE) 50 MCG/ACT nasal spray Place 1-2 sprays into both nostrils daily as needed for allergies or rhinitis.     loperamide (IMODIUM A-D) 2 MG tablet Take 1 tablet (2 mg total) by mouth as needed for diarrhea or loose stools. 30 tablet 3   Magnesium Oxide 400 MG CAPS Take 1 capsule (400 mg total) by mouth daily. 5 capsule -0   Multiple Vitamins-Minerals (MULTI COMPLETE/IRON) TABS Take 1 tablet by mouth daily with breakfast.     PHENobarbital (LUMINAL) 32.4 MG tablet Take 2 tablets (64.8 mg total) by mouth daily with breakfast. 60 tablet 1   phenytoin (DILANTIN) 100 MG ER capsule Take 200 mg by mouth daily with breakfast.     simvastatin (ZOCOR) 20 MG tablet Take 20 mg by mouth at bedtime.     SYSTANE ULTRA PF 0.4-0.3 % SOLN Place 1 drop into both eyes in the morning and at bedtime.     valsartan (DIOVAN) 80 MG tablet Take 1 tablet (80 mg total) by mouth daily. 90 tablet 3   No current facility-administered medications for this visit.     Review of Systems    She denies chest pain, palpitations, dyspnea, pnd, orthopnea, n, v, dizziness, syncope, edema, weight gain, or early satiety. All other systems reviewed and are otherwise negative except as noted above.   Physical Exam    VS:  BP (!) 146/57   Pulse 86   Ht '5\' 5"'$  (1.651 m)   Wt 141 lb (64 kg)   SpO2 98%    BMI 23.46 kg/m   GEN: Well nourished, well developed, in no acute distress. HEENT: normal. Neck: Supple, no JVD, bilateral carotid bruits, no masses. Cardiac: RRR, no murmurs, rubs, or gallops. No clubbing, cyanosis, edema.  Radials/DP/PT 2+ and equal bilaterally.  Respiratory:  Respirations regular and unlabored, clear to auscultation bilaterally. GI: Soft, nontender, nondistended, BS + x 4. MS: no deformity or atrophy. Skin: warm and dry, no rash. Neuro:  Strength and sensation are intact. Psych: Normal affect.  Accessory Clinical Findings    ECG personally reviewed by me today -atrial sensed, ventricular paced, 82 bpm- no acute changes.  Lab Results  Component Value Date   WBC 8.3 11/09/2021   HGB 9.9 (A) 11/09/2021   HCT 29 (A) 11/09/2021   MCV 104.0 (H) 10/16/2021   PLT 158 11/09/2021   Lab Results  Component Value Date   CREATININE 1.33 (H) 10/17/2021  BUN 28 (H) 10/17/2021   NA 140 10/17/2021   K 4.0 10/17/2021   CL 109 10/17/2021   CO2 24 10/17/2021   Lab Results  Component Value Date   ALT 26 10/16/2021   AST 29 10/16/2021   ALKPHOS 52 10/16/2021   BILITOT 0.5 10/16/2021   No results found for: "CHOL", "HDL", "LDLCALC", "LDLDIRECT", "TRIG", "CHOLHDL"  No results found for: "HGBA1C"  Assessment & Plan    1. Recurrent syncope/CHB: S/p PPM 11/07/2021.  She denies any recurrent syncope.  PPM site clean, dry, intact without bruising, bleeding, or hematoma.  Steri-Strips intact.  She states she is scheduled for a wound check tomorrow.  Follow-up with Dr. Lovena Le as planned.  2. RBBB: EKG today shows atrial sensing, V pacing, 82 bpm. Stable.   3. New onset atrial fibrillation: Recent care alert for AF > 0.5hr/24hrs. Presenting rhythm AF with Vs/Vp (controlled rates). More recent PPM interrogation not available for review in office today.  Asymptomatic. CHA2DS2-VASc Score = 5.  Discussed with Dr. Martinique, primary cardiologist.  Unfortunately, she is not a candidate  for DOAC at this time given history of cerebral AV malformation.  Per Dr. Tanna Furry recommendation, will refer to A-fib clinic for further management.   4. Carotid artery disease: Carotid Dopplers in July 2022 showed 40 to 59% B ICA stenosis, stable from prior study.  Bilateral carotid bruits on exam.  Asymptomatic.  No indication for repeat study at this time.  5. Hypertension: BP elevated above goal in office today.  We will start valsartan 80 mg daily.  Recommend BMET in 2 weeks.  Continue to monitor BP and report BP consistently >130/80.   6. Hyperlipidemia: LDL was 118 in September 2022.  Monitored and managed per PCP.  Continue simvastatin.  7. Anemia: CBC was 9.9 in June 2023.  Monitored and managed per PCP.  8. Partial symptomatic epilepsy with complex partial seizures: Managed on Dilantin, and phenobarbital.  Stable.  9. Disposition: Refer to atrial fibrillation clinic as above.  Follow-up as scheduled with Dr. Lovena Le and Dr. Martinique in September 2023 (3 months).    Lenna Sciara, NP 11/21/2021, 12:46 PM

## 2021-11-22 ENCOUNTER — Telehealth: Payer: Self-pay | Admitting: Nurse Practitioner

## 2021-11-22 ENCOUNTER — Ambulatory Visit (INDEPENDENT_AMBULATORY_CARE_PROVIDER_SITE_OTHER): Payer: Medicare Other

## 2021-11-22 DIAGNOSIS — I442 Atrioventricular block, complete: Secondary | ICD-10-CM

## 2021-11-22 DIAGNOSIS — M5136 Other intervertebral disc degeneration, lumbar region: Secondary | ICD-10-CM | POA: Diagnosis not present

## 2021-11-22 DIAGNOSIS — R278 Other lack of coordination: Secondary | ICD-10-CM | POA: Diagnosis not present

## 2021-11-22 DIAGNOSIS — M6389 Disorders of muscle in diseases classified elsewhere, multiple sites: Secondary | ICD-10-CM | POA: Diagnosis not present

## 2021-11-22 DIAGNOSIS — R2689 Other abnormalities of gait and mobility: Secondary | ICD-10-CM | POA: Diagnosis not present

## 2021-11-22 DIAGNOSIS — Z950 Presence of cardiac pacemaker: Secondary | ICD-10-CM | POA: Diagnosis not present

## 2021-11-22 DIAGNOSIS — M4807 Spinal stenosis, lumbosacral region: Secondary | ICD-10-CM | POA: Diagnosis not present

## 2021-11-22 LAB — CUP PACEART INCLINIC DEVICE CHECK
Battery Remaining Longevity: 137 mo
Battery Voltage: 3.21 V
Brady Statistic AP VP Percent: 4.36 %
Brady Statistic AP VS Percent: 0.04 %
Brady Statistic AS VP Percent: 90.89 %
Brady Statistic AS VS Percent: 4.67 %
Brady Statistic RA Percent Paced: 4.12 %
Brady Statistic RV Percent Paced: 87.27 %
Date Time Interrogation Session: 20230614101200
Implantable Lead Implant Date: 20230530
Implantable Lead Implant Date: 20230530
Implantable Lead Location: 753859
Implantable Lead Location: 753860
Implantable Lead Model: 3830
Implantable Lead Model: 5076
Implantable Pulse Generator Implant Date: 20230530
Lead Channel Impedance Value: 266 Ohm
Lead Channel Impedance Value: 342 Ohm
Lead Channel Impedance Value: 342 Ohm
Lead Channel Impedance Value: 475 Ohm
Lead Channel Pacing Threshold Amplitude: 0.75 V
Lead Channel Pacing Threshold Amplitude: 0.875 V
Lead Channel Pacing Threshold Pulse Width: 0.4 ms
Lead Channel Pacing Threshold Pulse Width: 0.4 ms
Lead Channel Sensing Intrinsic Amplitude: 1.75 mV
Lead Channel Sensing Intrinsic Amplitude: 1.75 mV
Lead Channel Sensing Intrinsic Amplitude: 12.75 mV
Lead Channel Sensing Intrinsic Amplitude: 14.5 mV
Lead Channel Setting Pacing Amplitude: 3.5 V
Lead Channel Setting Pacing Amplitude: 3.5 V
Lead Channel Setting Pacing Pulse Width: 0.4 ms
Lead Channel Setting Sensing Sensitivity: 1.2 mV

## 2021-11-22 NOTE — Telephone Encounter (Signed)
Pt c/o medication issue:  1. Name of Medication:   valsartan (DIOVAN) 80 MG tablet  2. How are you currently taking this medication (dosage and times per day)? Not currently taking  3. Are you having a reaction (difficulty breathing--STAT)? N/A  4. What is your medication issue? [facility], LPN needs clarification about whether or not the patient should d/c this medication.  Please fax written order to [phone].

## 2021-11-22 NOTE — Progress Notes (Signed)
Wound check appointment. Steri-strips removed. Wound without redness or edema. Incision edges approximated, wound well healed. Normal device function. Thresholds, sensing, and impedances consistent with implant measurements. Device programmed at 3.5V/auto capture programmed on for extra safety margin until 3 month visit. Histogram distribution appropriate for patient and level of activity. AT/AF burden 16.5%, AF noted with longest in duration 20 hours. Has AF clinic apt. 12/07/21. No high ventricular rates noted. Patient educated about wound care, arm mobility, lifting restrictions. ROV in 3 months with implanting physician.

## 2021-11-22 NOTE — Patient Instructions (Signed)

## 2021-11-22 NOTE — Telephone Encounter (Signed)
Valsartan 80 mg rx along with recent OV faxed to [facility] [phone]), Attention: Karlene Einstein, LPN

## 2021-11-23 DIAGNOSIS — Z950 Presence of cardiac pacemaker: Secondary | ICD-10-CM | POA: Diagnosis not present

## 2021-11-23 DIAGNOSIS — M6389 Disorders of muscle in diseases classified elsewhere, multiple sites: Secondary | ICD-10-CM | POA: Diagnosis not present

## 2021-11-23 DIAGNOSIS — R278 Other lack of coordination: Secondary | ICD-10-CM | POA: Diagnosis not present

## 2021-11-23 DIAGNOSIS — R2689 Other abnormalities of gait and mobility: Secondary | ICD-10-CM | POA: Diagnosis not present

## 2021-11-23 DIAGNOSIS — M4807 Spinal stenosis, lumbosacral region: Secondary | ICD-10-CM | POA: Diagnosis not present

## 2021-11-23 DIAGNOSIS — M5136 Other intervertebral disc degeneration, lumbar region: Secondary | ICD-10-CM | POA: Diagnosis not present

## 2021-11-24 DIAGNOSIS — M6389 Disorders of muscle in diseases classified elsewhere, multiple sites: Secondary | ICD-10-CM | POA: Diagnosis not present

## 2021-11-24 DIAGNOSIS — M5136 Other intervertebral disc degeneration, lumbar region: Secondary | ICD-10-CM | POA: Diagnosis not present

## 2021-11-24 DIAGNOSIS — R2689 Other abnormalities of gait and mobility: Secondary | ICD-10-CM | POA: Diagnosis not present

## 2021-11-24 DIAGNOSIS — R278 Other lack of coordination: Secondary | ICD-10-CM | POA: Diagnosis not present

## 2021-11-24 DIAGNOSIS — M4807 Spinal stenosis, lumbosacral region: Secondary | ICD-10-CM | POA: Diagnosis not present

## 2021-11-24 DIAGNOSIS — Z950 Presence of cardiac pacemaker: Secondary | ICD-10-CM | POA: Diagnosis not present

## 2021-11-24 NOTE — Telephone Encounter (Signed)
Has AF clinic apt on 12/07/21.

## 2021-11-27 DIAGNOSIS — M4807 Spinal stenosis, lumbosacral region: Secondary | ICD-10-CM | POA: Diagnosis not present

## 2021-11-27 DIAGNOSIS — M6389 Disorders of muscle in diseases classified elsewhere, multiple sites: Secondary | ICD-10-CM | POA: Diagnosis not present

## 2021-11-27 DIAGNOSIS — Z950 Presence of cardiac pacemaker: Secondary | ICD-10-CM | POA: Diagnosis not present

## 2021-11-27 DIAGNOSIS — R278 Other lack of coordination: Secondary | ICD-10-CM | POA: Diagnosis not present

## 2021-11-27 DIAGNOSIS — M5136 Other intervertebral disc degeneration, lumbar region: Secondary | ICD-10-CM | POA: Diagnosis not present

## 2021-11-29 DIAGNOSIS — M5136 Other intervertebral disc degeneration, lumbar region: Secondary | ICD-10-CM | POA: Diagnosis not present

## 2021-11-29 DIAGNOSIS — R278 Other lack of coordination: Secondary | ICD-10-CM | POA: Diagnosis not present

## 2021-11-29 DIAGNOSIS — R2689 Other abnormalities of gait and mobility: Secondary | ICD-10-CM | POA: Diagnosis not present

## 2021-11-29 DIAGNOSIS — Z950 Presence of cardiac pacemaker: Secondary | ICD-10-CM | POA: Diagnosis not present

## 2021-11-29 DIAGNOSIS — M4807 Spinal stenosis, lumbosacral region: Secondary | ICD-10-CM | POA: Diagnosis not present

## 2021-11-30 DIAGNOSIS — M4807 Spinal stenosis, lumbosacral region: Secondary | ICD-10-CM | POA: Diagnosis not present

## 2021-11-30 DIAGNOSIS — M6389 Disorders of muscle in diseases classified elsewhere, multiple sites: Secondary | ICD-10-CM | POA: Diagnosis not present

## 2021-11-30 DIAGNOSIS — M5136 Other intervertebral disc degeneration, lumbar region: Secondary | ICD-10-CM | POA: Diagnosis not present

## 2021-11-30 DIAGNOSIS — R278 Other lack of coordination: Secondary | ICD-10-CM | POA: Diagnosis not present

## 2021-11-30 DIAGNOSIS — Z950 Presence of cardiac pacemaker: Secondary | ICD-10-CM | POA: Diagnosis not present

## 2021-12-01 DIAGNOSIS — Z950 Presence of cardiac pacemaker: Secondary | ICD-10-CM | POA: Diagnosis not present

## 2021-12-01 DIAGNOSIS — M6389 Disorders of muscle in diseases classified elsewhere, multiple sites: Secondary | ICD-10-CM | POA: Diagnosis not present

## 2021-12-01 DIAGNOSIS — R278 Other lack of coordination: Secondary | ICD-10-CM | POA: Diagnosis not present

## 2021-12-01 DIAGNOSIS — M5136 Other intervertebral disc degeneration, lumbar region: Secondary | ICD-10-CM | POA: Diagnosis not present

## 2021-12-01 DIAGNOSIS — M4807 Spinal stenosis, lumbosacral region: Secondary | ICD-10-CM | POA: Diagnosis not present

## 2021-12-04 ENCOUNTER — Encounter: Payer: Self-pay | Admitting: Adult Health

## 2021-12-04 ENCOUNTER — Non-Acute Institutional Stay (SKILLED_NURSING_FACILITY): Payer: Medicare Other | Admitting: Adult Health

## 2021-12-04 DIAGNOSIS — D509 Iron deficiency anemia, unspecified: Secondary | ICD-10-CM

## 2021-12-04 DIAGNOSIS — I10 Essential (primary) hypertension: Secondary | ICD-10-CM | POA: Diagnosis not present

## 2021-12-04 DIAGNOSIS — Q282 Arteriovenous malformation of cerebral vessels: Secondary | ICD-10-CM

## 2021-12-04 DIAGNOSIS — Z950 Presence of cardiac pacemaker: Secondary | ICD-10-CM | POA: Diagnosis not present

## 2021-12-04 DIAGNOSIS — E782 Mixed hyperlipidemia: Secondary | ICD-10-CM | POA: Diagnosis not present

## 2021-12-04 DIAGNOSIS — R278 Other lack of coordination: Secondary | ICD-10-CM | POA: Diagnosis not present

## 2021-12-04 DIAGNOSIS — I480 Paroxysmal atrial fibrillation: Secondary | ICD-10-CM | POA: Diagnosis not present

## 2021-12-04 DIAGNOSIS — I459 Conduction disorder, unspecified: Secondary | ICD-10-CM | POA: Diagnosis not present

## 2021-12-04 DIAGNOSIS — M5136 Other intervertebral disc degeneration, lumbar region: Secondary | ICD-10-CM | POA: Diagnosis not present

## 2021-12-04 DIAGNOSIS — G40209 Localization-related (focal) (partial) symptomatic epilepsy and epileptic syndromes with complex partial seizures, not intractable, without status epilepticus: Secondary | ICD-10-CM | POA: Diagnosis not present

## 2021-12-04 DIAGNOSIS — R2689 Other abnormalities of gait and mobility: Secondary | ICD-10-CM | POA: Diagnosis not present

## 2021-12-04 DIAGNOSIS — M6389 Disorders of muscle in diseases classified elsewhere, multiple sites: Secondary | ICD-10-CM | POA: Diagnosis not present

## 2021-12-04 DIAGNOSIS — M4807 Spinal stenosis, lumbosacral region: Secondary | ICD-10-CM | POA: Diagnosis not present

## 2021-12-05 DIAGNOSIS — I10 Essential (primary) hypertension: Secondary | ICD-10-CM | POA: Diagnosis not present

## 2021-12-05 DIAGNOSIS — Z79899 Other long term (current) drug therapy: Secondary | ICD-10-CM | POA: Diagnosis not present

## 2021-12-06 DIAGNOSIS — M5136 Other intervertebral disc degeneration, lumbar region: Secondary | ICD-10-CM | POA: Diagnosis not present

## 2021-12-06 DIAGNOSIS — Z950 Presence of cardiac pacemaker: Secondary | ICD-10-CM | POA: Diagnosis not present

## 2021-12-06 DIAGNOSIS — R278 Other lack of coordination: Secondary | ICD-10-CM | POA: Diagnosis not present

## 2021-12-06 DIAGNOSIS — M6389 Disorders of muscle in diseases classified elsewhere, multiple sites: Secondary | ICD-10-CM | POA: Diagnosis not present

## 2021-12-06 DIAGNOSIS — M4807 Spinal stenosis, lumbosacral region: Secondary | ICD-10-CM | POA: Diagnosis not present

## 2021-12-07 ENCOUNTER — Inpatient Hospital Stay (HOSPITAL_COMMUNITY): Admission: RE | Admit: 2021-12-07 | Payer: Medicare Other | Source: Ambulatory Visit | Admitting: Nurse Practitioner

## 2021-12-07 DIAGNOSIS — R2689 Other abnormalities of gait and mobility: Secondary | ICD-10-CM | POA: Diagnosis not present

## 2021-12-07 DIAGNOSIS — R278 Other lack of coordination: Secondary | ICD-10-CM | POA: Diagnosis not present

## 2021-12-07 DIAGNOSIS — M5136 Other intervertebral disc degeneration, lumbar region: Secondary | ICD-10-CM | POA: Diagnosis not present

## 2021-12-07 DIAGNOSIS — M4807 Spinal stenosis, lumbosacral region: Secondary | ICD-10-CM | POA: Diagnosis not present

## 2021-12-07 DIAGNOSIS — Z950 Presence of cardiac pacemaker: Secondary | ICD-10-CM | POA: Diagnosis not present

## 2021-12-08 DIAGNOSIS — R278 Other lack of coordination: Secondary | ICD-10-CM | POA: Diagnosis not present

## 2021-12-08 DIAGNOSIS — M4807 Spinal stenosis, lumbosacral region: Secondary | ICD-10-CM | POA: Diagnosis not present

## 2021-12-08 DIAGNOSIS — Z950 Presence of cardiac pacemaker: Secondary | ICD-10-CM | POA: Diagnosis not present

## 2021-12-08 DIAGNOSIS — M5136 Other intervertebral disc degeneration, lumbar region: Secondary | ICD-10-CM | POA: Diagnosis not present

## 2021-12-08 DIAGNOSIS — M6389 Disorders of muscle in diseases classified elsewhere, multiple sites: Secondary | ICD-10-CM | POA: Diagnosis not present

## 2021-12-11 ENCOUNTER — Telehealth: Payer: Self-pay

## 2021-12-11 ENCOUNTER — Other Ambulatory Visit: Payer: Self-pay

## 2021-12-11 DIAGNOSIS — M5136 Other intervertebral disc degeneration, lumbar region: Secondary | ICD-10-CM | POA: Diagnosis not present

## 2021-12-11 DIAGNOSIS — R2689 Other abnormalities of gait and mobility: Secondary | ICD-10-CM | POA: Diagnosis not present

## 2021-12-11 DIAGNOSIS — M4807 Spinal stenosis, lumbosacral region: Secondary | ICD-10-CM | POA: Diagnosis not present

## 2021-12-11 DIAGNOSIS — R278 Other lack of coordination: Secondary | ICD-10-CM | POA: Diagnosis not present

## 2021-12-11 DIAGNOSIS — Z950 Presence of cardiac pacemaker: Secondary | ICD-10-CM | POA: Diagnosis not present

## 2021-12-11 MED ORDER — VALSARTAN 160 MG PO TABS: 160.0000 mg | ORAL_TABLET | Freq: Every day | ORAL | 30 refills | Status: DC

## 2021-12-11 NOTE — Telephone Encounter (Signed)
Noted new RX Valsartan 160 mg daily was sent to pts pharmacy. Order sent to [facility].

## 2021-12-11 NOTE — Telephone Encounter (Signed)
Received fax from [place] [facility] today 12/11/21. Pt was seen on 11/21/21 and asked to report BP consistently greater than 130/80. Pt was started on Valsartan 80 mg on 11/22/21.   BP Readings:  11/28/21 135/55 @ 9:13 AM, 148/61 '@3'$ :46 PM, 145/64 @ 8:30 PM,  11/29/21 150/56 '@10'$ :51 AM, 151/55 @ 4:30 PM,  11/30/21 156/63 @ 10:10 AM, 124/67 @ 4:50 PM,  12/01/21 130/61 '@12'$ :07 AM, 132/92 '@11'$ :13 AM, 150/55 @ 5:52 PM, 150/71 @ 11:51 PM 12/02/21 157/52 @ 2:32 PM, 156/55 @ 3:53 PM, 137/70 '@11'$ :09 PM 12/03/21 147/49 @ 11:12 AM

## 2021-12-13 DIAGNOSIS — M6389 Disorders of muscle in diseases classified elsewhere, multiple sites: Secondary | ICD-10-CM | POA: Diagnosis not present

## 2021-12-13 DIAGNOSIS — R278 Other lack of coordination: Secondary | ICD-10-CM | POA: Diagnosis not present

## 2021-12-13 DIAGNOSIS — M4807 Spinal stenosis, lumbosacral region: Secondary | ICD-10-CM | POA: Diagnosis not present

## 2021-12-13 DIAGNOSIS — M5136 Other intervertebral disc degeneration, lumbar region: Secondary | ICD-10-CM | POA: Diagnosis not present

## 2021-12-13 DIAGNOSIS — Z950 Presence of cardiac pacemaker: Secondary | ICD-10-CM | POA: Diagnosis not present

## 2021-12-14 ENCOUNTER — Telehealth: Payer: Self-pay | Admitting: Nurse Practitioner

## 2021-12-14 ENCOUNTER — Ambulatory Visit (HOSPITAL_COMMUNITY)
Admission: RE | Admit: 2021-12-14 | Discharge: 2021-12-14 | Disposition: A | Discharge status: Home / Self Care | Payer: Medicare Other | Source: Ambulatory Visit | Attending: Nurse Practitioner | Admitting: Nurse Practitioner

## 2021-12-14 ENCOUNTER — Encounter (HOSPITAL_COMMUNITY): Payer: Self-pay | Admitting: Nurse Practitioner

## 2021-12-14 VITALS — BP 166/56 | HR 87 | Ht 65.0 in | Wt 136.8 lb

## 2021-12-14 DIAGNOSIS — M6389 Disorders of muscle in diseases classified elsewhere, multiple sites: Secondary | ICD-10-CM | POA: Diagnosis not present

## 2021-12-14 DIAGNOSIS — I341 Nonrheumatic mitral (valve) prolapse: Secondary | ICD-10-CM | POA: Insufficient documentation

## 2021-12-14 DIAGNOSIS — D509 Iron deficiency anemia, unspecified: Secondary | ICD-10-CM | POA: Diagnosis not present

## 2021-12-14 DIAGNOSIS — I10 Essential (primary) hypertension: Secondary | ICD-10-CM | POA: Insufficient documentation

## 2021-12-14 DIAGNOSIS — G40209 Localization-related (focal) (partial) symptomatic epilepsy and epileptic syndromes with complex partial seizures, not intractable, without status epilepticus: Secondary | ICD-10-CM | POA: Insufficient documentation

## 2021-12-14 DIAGNOSIS — G3184 Mild cognitive impairment, so stated: Secondary | ICD-10-CM | POA: Diagnosis not present

## 2021-12-14 DIAGNOSIS — I480 Paroxysmal atrial fibrillation: Secondary | ICD-10-CM | POA: Insufficient documentation

## 2021-12-14 DIAGNOSIS — Q282 Arteriovenous malformation of cerebral vessels: Secondary | ICD-10-CM | POA: Insufficient documentation

## 2021-12-14 DIAGNOSIS — Z950 Presence of cardiac pacemaker: Secondary | ICD-10-CM | POA: Diagnosis not present

## 2021-12-14 DIAGNOSIS — R42 Dizziness and giddiness: Secondary | ICD-10-CM | POA: Diagnosis not present

## 2021-12-14 DIAGNOSIS — E785 Hyperlipidemia, unspecified: Secondary | ICD-10-CM | POA: Insufficient documentation

## 2021-12-14 DIAGNOSIS — M5136 Other intervertebral disc degeneration, lumbar region: Secondary | ICD-10-CM | POA: Diagnosis not present

## 2021-12-14 DIAGNOSIS — I4510 Unspecified right bundle-branch block: Secondary | ICD-10-CM | POA: Insufficient documentation

## 2021-12-14 DIAGNOSIS — R278 Other lack of coordination: Secondary | ICD-10-CM | POA: Diagnosis not present

## 2021-12-14 DIAGNOSIS — Z8679 Personal history of other diseases of the circulatory system: Secondary | ICD-10-CM | POA: Insufficient documentation

## 2021-12-14 DIAGNOSIS — M4807 Spinal stenosis, lumbosacral region: Secondary | ICD-10-CM | POA: Diagnosis not present

## 2021-12-14 NOTE — Telephone Encounter (Signed)
New Message:    Chantea from [employer] called. She needs clarification on patient's order for her Valsartan please.

## 2021-12-14 NOTE — Telephone Encounter (Signed)
Per Diona Browner  written order,  Lenna Sciara, NP to Derrick Ravel, [facility]     08/15/48  4:46 PM Please increase valsartan to 160 mg daily. Continue to monitor blood pressure and report BP consistently > 130/80. Thank you. -EM

## 2021-12-14 NOTE — Telephone Encounter (Signed)
[place] - no answer -  voicemail is full-

## 2021-12-14 NOTE — Telephone Encounter (Signed)
Spoke to  [place] at [employer] - she states patient received a prescription from [facility] for Valsartan 160 mg daily. [facility] need an order stating the change in medication from 80 mg to 160 mg.    RN informed Cala Bradford will be able to send the message thread changing medication dose.    Chantea states she will show to her supervisor to see if this will suffice if not will need a written order.  Aware Raquel Sarna will not be in the office until  next week ( July 11,2023)

## 2021-12-14 NOTE — Progress Notes (Signed)
Primary Care Physician: Burnard Bunting, MD Referring Physician: NL  Cardiologist: Dr. Martinique    [name] is a 86 y.o. female with a h/o history of recurrent syncope (Stokes-Adams attack), complete heart block s/p PPM, 11/07/21, RBBB, paroxysmal atrial fibrillation, mitral valve prolapse, hypertension, hyperlipidemia, carotid artery disease, DDD, cerebral AV malformation, complex partial seizures, memory impairment, iron deficiency anemia, and osteopenia who presents for follow-up related to heart block, hypertension, and atrial fibrillation.  She  recently showed AT/AF burden 16.5%, AF noted with longest in duration 20 hours on first interrogation of her PPM, 11/22/21, occurring 11/14/21. She was asked to f/u in the afib clinic. She  was seen by Diona Browner, NP,  6/13 and her note indicated that she would not be a candidate for DOAC at this time for history of cerebral AV malformation.   In the office today, she is  av paced. She did not feel the afib at time of occurrence. She is not an anticoagulation candidate due to the history of  cerebral AVM's. She would not be a Watchman candidate due to the fact that she can not take any anticoagulation. No complaints voiced today.   Today, she denies symptoms of palpitations, chest pain, shortness of breath, orthopnea, PND, lower extremity edema, dizziness, presyncope, syncope, or neurologic sequela. The patient is tolerating medications without difficulties and is otherwise without complaint today.   Past Medical History:  Diagnosis Date   Abnormal cardiovascular stress test    Carotid arterial disease (HCC)    MODERATE RIGHT   Cerebral AV malformation    DDD (degenerative disc disease)    SEVERE WITH SPINAL STENOSIS   Hypercholesterolemia    Hypertension    MVP (mitral valve prolapse)    MILD   Numbness    LEFT FOOT AND ANKLE   Osteopenia    Pseudo-gout    Past Surgical History:  Procedure Laterality Date   APPENDECTOMY      BREAST MASS EXCISION     CARPAL TUNNEL RELEASE     PACEMAKER IMPLANT N/A 11/07/2021   Procedure: PACEMAKER IMPLANT;  Surgeon: Evans Lance, MD;  Location: [facility];  Service: Cardiovascular;  Laterality: N/A;   REMOVAL OF PARATHYROID GLAND     STRESS NUCLEAR STUDY     ABNORMAL   TONSILLECTOMY      Current Outpatient Medications  Medication Sig Dispense Refill   acetaminophen (TYLENOL) 500 MG tablet Take 500 mg by mouth every 6 (six) hours as needed for mild pain.     B Complex Vitamins (VITAMIN-B COMPLEX) TABS Take 1 tablet by mouth daily.     docusate sodium (COLACE) 100 MG capsule Take 100 mg by mouth at bedtime.     fluticasone (FLONASE) 50 MCG/ACT nasal spray Place 1-2 sprays into both nostrils daily.     loperamide (IMODIUM A-D) 2 MG tablet Take 1 tablet (2 mg total) by mouth as needed for diarrhea or loose stools. 30 tablet 3   Multiple Vitamins-Minerals (MULTI COMPLETE/IRON) TABS Take 1 tablet by mouth daily with breakfast.     PHENobarbital (LUMINAL) 32.4 MG tablet Take 2 tablets (64.8 mg total) by mouth daily with breakfast. 60 tablet 1   phenytoin (DILANTIN) 100 MG ER capsule Take 200 mg by mouth daily with breakfast.     simvastatin (ZOCOR) 20 MG tablet Take 20 mg by mouth at bedtime.     SYSTANE ULTRA PF 0.4-0.3 % SOLN Place 1 drop into both eyes in the morning and  at bedtime.     valsartan (DIOVAN) 160 MG tablet Take 1 tablet (160 mg total) by mouth daily. 90 tablet 30   No current facility-administered medications for this encounter.    Allergies  Allergen Reactions   Azithromycin Other (See Comments)    Reaction not recalled   Penicillins Hives and Other (See Comments)    Bad reaction as a child after a shot of Penicillin   Pravastatin Other (See Comments)    Reaction not recalled   Sulfa Drugs Cross Reactors Other (See Comments)    Reaction not recalled- stated she can take it "in small doses"   Tape Itching and Other (See Comments)    Cannot wear for  an extended period of time   Naproxen Sodium Rash    Social History   Socioeconomic History   Marital status: Widowed    Spouse name: Not on file   Number of children: 3   Years of education: Not on file   Highest education level: Not on file  Occupational History   Not on file  Tobacco Use   Smoking status: Former    Packs/day: 1.00    Years: 14.00    Total pack years: 14.00    Types: Cigarettes    Quit date: 11/07/1968    Years since quitting: 53.1   Smokeless tobacco: Never  Vaping Use   Vaping Use: Never used  Substance and Sexual Activity   Alcohol use: No   Drug use: No   Sexual activity: Not on file  Other Topics Concern   Not on file  Social History Narrative   [place] home [place] Strain: Not on file  Food Insecurity: Not on file  Transportation Needs: Not on file  Physical Activity: Not on file  Stress: Not on file  Social Connections: Not on file  Intimate Partner Violence: Not on file    Family History  Problem Relation Age of Onset   Stroke Mother    Lung cancer Father    Breast cancer Neg Hx     ROS- All systems are reviewed and negative except as per the HPI above  Physical Exam: There were no vitals filed for this visit. Wt Readings from Last 3 Encounters:  12/04/21 63 kg  11/21/21 64 kg  11/13/21 61.9 kg    Labs: Lab Results  Component Value Date   NA 140 10/17/2021   K 4.0 10/17/2021   CL 109 10/17/2021   CO2 24 10/17/2021   GLUCOSE 103 (H) 10/17/2021   BUN 28 (H) 10/17/2021   CREATININE 1.33 (H) 10/17/2021   CALCIUM 8.5 (L) 10/17/2021   PHOS 3.4 07/25/2020   MG 1.4 (L) 10/17/2021   No results found for: "INR" No results found for: "CHOL", "HDL", "[place]", "TRIG"   GEN- The patient is well appearing, alert and oriented x 3 today.   Head- normocephalic, atraumatic Eyes-  Sclera clear, conjunctiva pink Ears- hearing intact Oropharynx- clear Neck- supple,  no JVP Lymph- no cervical lymphadenopathy Lungs- Clear to ausculation bilaterally, normal work of breathing Heart- Regular rate and rhythm, no murmurs, rubs or gallops, PMI not laterally displaced GI- soft, NT, ND, + BS Extremities- no clubbing, cyanosis, or edema MS- no significant deformity or atrophy Skin- no rash or lesion Psych- euthymic mood, full affect Neuro- strength and sensation are intact  EKG-Vent. rate 87 BPM PR interval 200 ms QRS duration 140 ms QT/QTcB 408/490 ms  P-R-T axes 94 115 78 Atrial-sensed ventricular-paced rhythm Abnormal ECG When compared with ECG of 07-Nov-2021 17:27, PREVIOUS ECG IS PRESENT No significant change since Confirmed by Larae Grooms [phone]) on 12/14/2021 12:47:24 PM    Assessment and Plan:  1. Afib  20 hours of afib seen on 6/6 after implant of PPM on 5/30  She was asymptomatic  Av paced today  I will not add rate control for low burden  and not being symptomatic ( discussed with Dr. Lovena Le)   2. CHA2DS2VASc score of 6 Not an anticoagulation candidate due to h/o of cerebral aneurysms per Dr. Martinique and Dr. Lovena Le also  agrees to avoid   No change in approach today   F/u with Dr. Alessandra Grout as scheduled Afib clinic as needed

## 2021-12-14 NOTE — Telephone Encounter (Signed)
Thank you. I will contact [facility]. Order was faxed to them when RX was sent to them.

## 2021-12-14 NOTE — Telephone Encounter (Signed)
Called [facility]. VM is full. Will call back.

## 2021-12-15 NOTE — Telephone Encounter (Signed)
FYI. Order faxed. New fax with labs received from [facility]. BMET-Glucose 94, Calcium 8.8, Creatinine 1.05, BUN (R) 23.8, BUN/CREAT Ratio 22.7, Sodium 141, K 4.6, Chloride 104, Carbon Dioxide 25, ANGAP 16.1, Osmolality 53, GFR 53, Magnesium 1.2.  Lab sheet will be reviewed Monday by Diona Browner, DNP.

## 2021-12-15 NOTE — Telephone Encounter (Signed)
Called [facility] and spoke with Chantea. [facility] didn't send the previous medication order faxed last Friday. She is sending a new order form that will be faxed back to [phone].

## 2021-12-19 ENCOUNTER — Other Ambulatory Visit: Payer: Self-pay

## 2021-12-19 DIAGNOSIS — R790 Abnormal level of blood mineral: Secondary | ICD-10-CM

## 2021-12-19 NOTE — Telephone Encounter (Signed)
   Patient Name: [name]  DOB: Sep 27, 1935 MRN: [id number]  Primary Cardiologist: Peter Martinique, MD  Reviewed recent labs that were faxed from [facility]. Patient's magnesium was low at 1.2 mg/dL. Please ensure that patient is taking magnesium 200 mg twice daily as prescribed.  Please recheck magnesium level and have results faxed to our office. Thank you.   Lenna Sciara, NP 12/19/2021, 3:29 PM

## 2021-12-19 NOTE — Telephone Encounter (Signed)
Noted. Orders and recommendations of continuing Magnesium 200 mg bid daily and repeat Magnesium level faxed to [facility].

## 2021-12-20 DIAGNOSIS — M6389 Disorders of muscle in diseases classified elsewhere, multiple sites: Secondary | ICD-10-CM | POA: Diagnosis not present

## 2021-12-20 DIAGNOSIS — M5136 Other intervertebral disc degeneration, lumbar region: Secondary | ICD-10-CM | POA: Diagnosis not present

## 2021-12-20 DIAGNOSIS — Z950 Presence of cardiac pacemaker: Secondary | ICD-10-CM | POA: Diagnosis not present

## 2021-12-20 DIAGNOSIS — M4807 Spinal stenosis, lumbosacral region: Secondary | ICD-10-CM | POA: Diagnosis not present

## 2021-12-20 DIAGNOSIS — R278 Other lack of coordination: Secondary | ICD-10-CM | POA: Diagnosis not present

## 2021-12-25 DIAGNOSIS — M5136 Other intervertebral disc degeneration, lumbar region: Secondary | ICD-10-CM | POA: Diagnosis not present

## 2021-12-25 DIAGNOSIS — M6389 Disorders of muscle in diseases classified elsewhere, multiple sites: Secondary | ICD-10-CM | POA: Diagnosis not present

## 2021-12-25 DIAGNOSIS — Z950 Presence of cardiac pacemaker: Secondary | ICD-10-CM | POA: Diagnosis not present

## 2021-12-25 DIAGNOSIS — R278 Other lack of coordination: Secondary | ICD-10-CM | POA: Diagnosis not present

## 2021-12-25 DIAGNOSIS — M4807 Spinal stenosis, lumbosacral region: Secondary | ICD-10-CM | POA: Diagnosis not present

## 2021-12-29 DIAGNOSIS — M5136 Other intervertebral disc degeneration, lumbar region: Secondary | ICD-10-CM | POA: Diagnosis not present

## 2021-12-29 DIAGNOSIS — M6389 Disorders of muscle in diseases classified elsewhere, multiple sites: Secondary | ICD-10-CM | POA: Diagnosis not present

## 2021-12-29 DIAGNOSIS — R278 Other lack of coordination: Secondary | ICD-10-CM | POA: Diagnosis not present

## 2021-12-29 DIAGNOSIS — M4807 Spinal stenosis, lumbosacral region: Secondary | ICD-10-CM | POA: Diagnosis not present

## 2021-12-29 DIAGNOSIS — Z950 Presence of cardiac pacemaker: Secondary | ICD-10-CM | POA: Diagnosis not present

## 2022-01-02 DIAGNOSIS — Z961 Presence of intraocular lens: Secondary | ICD-10-CM | POA: Diagnosis not present

## 2022-01-02 DIAGNOSIS — H52203 Unspecified astigmatism, bilateral: Secondary | ICD-10-CM | POA: Diagnosis not present

## 2022-01-03 DIAGNOSIS — I739 Peripheral vascular disease, unspecified: Secondary | ICD-10-CM | POA: Diagnosis not present

## 2022-01-03 DIAGNOSIS — G629 Polyneuropathy, unspecified: Secondary | ICD-10-CM | POA: Diagnosis not present

## 2022-01-03 DIAGNOSIS — R278 Other lack of coordination: Secondary | ICD-10-CM | POA: Diagnosis not present

## 2022-01-03 DIAGNOSIS — E042 Nontoxic multinodular goiter: Secondary | ICD-10-CM | POA: Diagnosis not present

## 2022-01-03 DIAGNOSIS — E785 Hyperlipidemia, unspecified: Secondary | ICD-10-CM | POA: Diagnosis not present

## 2022-01-03 DIAGNOSIS — M6389 Disorders of muscle in diseases classified elsewhere, multiple sites: Secondary | ICD-10-CM | POA: Diagnosis not present

## 2022-01-03 DIAGNOSIS — M4800 Spinal stenosis, site unspecified: Secondary | ICD-10-CM | POA: Diagnosis not present

## 2022-01-03 DIAGNOSIS — M4807 Spinal stenosis, lumbosacral region: Secondary | ICD-10-CM | POA: Diagnosis not present

## 2022-01-03 DIAGNOSIS — G40909 Epilepsy, unspecified, not intractable, without status epilepticus: Secondary | ICD-10-CM | POA: Diagnosis not present

## 2022-01-03 DIAGNOSIS — M1990 Unspecified osteoarthritis, unspecified site: Secondary | ICD-10-CM | POA: Diagnosis not present

## 2022-01-03 DIAGNOSIS — Z950 Presence of cardiac pacemaker: Secondary | ICD-10-CM | POA: Diagnosis not present

## 2022-01-03 DIAGNOSIS — D649 Anemia, unspecified: Secondary | ICD-10-CM | POA: Diagnosis not present

## 2022-01-03 DIAGNOSIS — M5136 Other intervertebral disc degeneration, lumbar region: Secondary | ICD-10-CM | POA: Diagnosis not present

## 2022-01-03 DIAGNOSIS — I10 Essential (primary) hypertension: Secondary | ICD-10-CM | POA: Diagnosis not present

## 2022-01-10 ENCOUNTER — Other Ambulatory Visit: Payer: Self-pay | Admitting: Orthopedic Surgery

## 2022-01-10 DIAGNOSIS — G40209 Localization-related (focal) (partial) symptomatic epilepsy and epileptic syndromes with complex partial seizures, not intractable, without status epilepticus: Secondary | ICD-10-CM

## 2022-01-10 MED ORDER — PHENOBARBITAL 32.4 MG PO TABS: 64.8000 mg | ORAL_TABLET | Freq: Every day | ORAL | 1 refills | Status: DC

## 2022-01-27 ENCOUNTER — Encounter (HOSPITAL_COMMUNITY): Payer: Self-pay | Admitting: Emergency Medicine

## 2022-01-27 ENCOUNTER — Inpatient Hospital Stay (HOSPITAL_COMMUNITY): Payer: Medicare Other

## 2022-01-27 ENCOUNTER — Emergency Department (HOSPITAL_COMMUNITY): Payer: Medicare Other

## 2022-01-27 ENCOUNTER — Observation Stay (HOSPITAL_COMMUNITY)
Admission: EM | Admit: 2022-01-27 | Discharge: 2022-01-28 | Disposition: A | Discharge status: Home / Self Care | Payer: Medicare Other | Attending: Cardiovascular Disease | Admitting: Cardiovascular Disease

## 2022-01-27 DIAGNOSIS — I10 Essential (primary) hypertension: Secondary | ICD-10-CM | POA: Insufficient documentation

## 2022-01-27 DIAGNOSIS — I700 Atherosclerosis of aorta: Secondary | ICD-10-CM

## 2022-01-27 DIAGNOSIS — Z950 Presence of cardiac pacemaker: Secondary | ICD-10-CM

## 2022-01-27 DIAGNOSIS — I2510 Atherosclerotic heart disease of native coronary artery without angina pectoris: Secondary | ICD-10-CM | POA: Insufficient documentation

## 2022-01-27 DIAGNOSIS — R531 Weakness: Secondary | ICD-10-CM | POA: Diagnosis not present

## 2022-01-27 DIAGNOSIS — R197 Diarrhea, unspecified: Secondary | ICD-10-CM | POA: Diagnosis not present

## 2022-01-27 DIAGNOSIS — R072 Precordial pain: Secondary | ICD-10-CM

## 2022-01-27 DIAGNOSIS — I480 Paroxysmal atrial fibrillation: Secondary | ICD-10-CM

## 2022-01-27 DIAGNOSIS — I442 Atrioventricular block, complete: Secondary | ICD-10-CM | POA: Diagnosis not present

## 2022-01-27 DIAGNOSIS — I309 Acute pericarditis, unspecified: Secondary | ICD-10-CM | POA: Diagnosis not present

## 2022-01-27 DIAGNOSIS — R079 Chest pain, unspecified: Secondary | ICD-10-CM | POA: Diagnosis present

## 2022-01-27 DIAGNOSIS — R0789 Other chest pain: Secondary | ICD-10-CM | POA: Diagnosis not present

## 2022-01-27 DIAGNOSIS — R61 Generalized hyperhidrosis: Secondary | ICD-10-CM | POA: Diagnosis not present

## 2022-01-27 DIAGNOSIS — Z79899 Other long term (current) drug therapy: Secondary | ICD-10-CM | POA: Diagnosis not present

## 2022-01-27 DIAGNOSIS — Z87891 Personal history of nicotine dependence: Secondary | ICD-10-CM | POA: Insufficient documentation

## 2022-01-27 DIAGNOSIS — I959 Hypotension, unspecified: Secondary | ICD-10-CM | POA: Diagnosis not present

## 2022-01-27 DIAGNOSIS — I3139 Other pericardial effusion (noninflammatory): Secondary | ICD-10-CM | POA: Diagnosis present

## 2022-01-27 DIAGNOSIS — R231 Pallor: Secondary | ICD-10-CM | POA: Diagnosis not present

## 2022-01-27 LAB — COMPREHENSIVE METABOLIC PANEL
ALT: 12 U/L (ref 0–44)
AST: 22 U/L (ref 15–41)
Albumin: 3.5 g/dL (ref 3.5–5.0)
Alkaline Phosphatase: 72 U/L (ref 38–126)
Anion gap: 9 (ref 5–15)
BUN: 28 mg/dL — ABNORMAL HIGH (ref 8–23)
CO2: 25 mmol/L (ref 22–32)
Calcium: 9.4 mg/dL (ref 8.9–10.3)
Chloride: 103 mmol/L (ref 98–111)
Creatinine, Ser: 1.29 mg/dL — ABNORMAL HIGH (ref 0.44–1.00)
GFR, Estimated: 41 mL/min — ABNORMAL LOW (ref >60)
Glucose, Bld: 126 mg/dL — ABNORMAL HIGH (ref 70–99)
Potassium: 4 mmol/L (ref 3.5–5.1)
Sodium: 137 mmol/L (ref 135–145)
Total Bilirubin: 0.6 mg/dL (ref 0.3–1.2)
Total Protein: 6.5 g/dL (ref 6.5–8.1)

## 2022-01-27 LAB — CBC
HCT: 28.9 % — ABNORMAL LOW (ref 36.0–46.0)
Hemoglobin: 9.5 g/dL — ABNORMAL LOW (ref 12.0–15.0)
MCH: 33.3 pg (ref 26.0–34.0)
MCHC: 32.9 g/dL (ref 30.0–36.0)
MCV: 101.4 fL — ABNORMAL HIGH (ref 80.0–100.0)
Platelets: 257 10*3/uL (ref 150–400)
RBC: 2.85 MIL/uL — ABNORMAL LOW (ref 3.87–5.11)
RDW: 13 % (ref 11.5–15.5)
WBC: 10.6 10*3/uL — ABNORMAL HIGH (ref 4.0–10.5)
nRBC: 0 % (ref 0.0–0.2)

## 2022-01-27 LAB — LIPASE, BLOOD: Lipase: 53 U/L — ABNORMAL HIGH (ref 11–51)

## 2022-01-27 LAB — MAGNESIUM: Magnesium: 1.7 mg/dL (ref 1.7–2.4)

## 2022-01-27 LAB — TSH: TSH: 1.914 u[IU]/mL (ref 0.350–4.500)

## 2022-01-27 LAB — TROPONIN I (HIGH SENSITIVITY)
Troponin I (High Sensitivity): 35 ng/L — ABNORMAL HIGH (ref <18)
Troponin I (High Sensitivity): 51 ng/L — ABNORMAL HIGH (ref <18)
Troponin I (High Sensitivity): 55 ng/L — ABNORMAL HIGH (ref <18)
Troponin I (High Sensitivity): 60 ng/L — ABNORMAL HIGH (ref <18)

## 2022-01-27 LAB — GLUCOSE, CAPILLARY: Glucose-Capillary: 94 mg/dL (ref 70–99)

## 2022-01-27 MED ORDER — PHENOBARBITAL 32.4 MG PO TABS
64.8000 mg | ORAL_TABLET | Freq: Every day | ORAL | Status: DC
  Administered 2022-01-28: 64.8 mg via ORAL
  Filled 2022-01-27: qty 2

## 2022-01-27 MED ORDER — NITROGLYCERIN 0.4 MG SL SUBL
0.8000 mg | SUBLINGUAL_TABLET | Freq: Once | SUBLINGUAL | Status: DC | PRN
  Filled 2022-01-27: qty 2

## 2022-01-27 MED ORDER — ACETAMINOPHEN 325 MG PO TABS: 650.0000 mg | ORAL_TABLET | ORAL | Status: DC | PRN

## 2022-01-27 MED ORDER — MAGNESIUM OXIDE -MG SUPPLEMENT 400 (240 MG) MG PO TABS
400.0000 mg | ORAL_TABLET | Freq: Every day | ORAL | Status: DC
  Administered 2022-01-27 – 2022-01-28 (×2): 400 mg via ORAL
  Filled 2022-01-27 (×2): qty 1

## 2022-01-27 MED ORDER — IOHEXOL 350 MG/ML SOLN
100.0000 mL | Freq: Once | INTRAVENOUS | Status: AC | PRN
  Administered 2022-01-27: 100 mL via INTRAVENOUS

## 2022-01-27 MED ORDER — HEPARIN SODIUM (PORCINE) 5000 UNIT/ML IJ SOLN
5000.0000 [IU] | Freq: Three times a day (TID) | INTRAMUSCULAR | Status: DC
  Filled 2022-01-27 (×2): qty 1

## 2022-01-27 MED ORDER — ASPIRIN 81 MG PO CHEW
324.0000 mg | CHEWABLE_TABLET | Freq: Once | ORAL | Status: AC
Start: 2022-01-27 — End: 2022-01-27
  Administered 2022-01-27: 324 mg via ORAL
  Filled 2022-01-27: qty 4

## 2022-01-27 MED ORDER — NITROGLYCERIN 0.4 MG SL SUBL: 0.4000 mg | SUBLINGUAL_TABLET | SUBLINGUAL | Status: DC | PRN

## 2022-01-27 MED ORDER — IRBESARTAN 150 MG PO TABS
150.0000 mg | ORAL_TABLET | Freq: Every day | ORAL | Status: DC
  Administered 2022-01-27 – 2022-01-28 (×2): 150 mg via ORAL
  Filled 2022-01-27 (×2): qty 1

## 2022-01-27 MED ORDER — ONDANSETRON HCL 4 MG/2ML IJ SOLN: 4.0000 mg | Freq: Four times a day (QID) | INTRAMUSCULAR | Status: DC | PRN

## 2022-01-27 MED ORDER — PHENYTOIN SODIUM EXTENDED 100 MG PO CAPS
200.0000 mg | ORAL_CAPSULE | Freq: Every day | ORAL | Status: DC
  Administered 2022-01-28: 200 mg via ORAL
  Filled 2022-01-27 (×2): qty 2

## 2022-01-27 NOTE — ED Triage Notes (Signed)
Patient BIB [facility] from [facility] for evaluation of chest pain that started while laying down this morning, pain resolved when sitting up. Patient also complains of generalized weakness and diarrhea. VSS. Patient is alert, oriented, and in no apparent distress at this time. 20g saline lock in left forearm.

## 2022-01-27 NOTE — ED Provider Notes (Signed)
[facility] EMERGENCY DEPARTMENT Provider Note   CSN: [id number] Arrival date & time: 01/27/22  4765     History {Add pertinent medical, surgical, social history, OB history to HPI:1} Chief Complaint  Patient presents with   Weakness    [name] is a 86 y.o. female.   Weakness Associated symptoms: chest pain and diarrhea   Patient presents for episodes of chest pain and generalized weakness.  Medical history includes HTN, HLD, CAD, MVP, seizures, and osteopenia.  She had a pacemaker placed in May.  She has had diarrhea over the past 2 days.  She estimates she has 3 episodes per day.  She has been able to eat and drink.  Other than the diarrhea, she reports that she has been in her normal state of health up until today.  This morning, she was woken up by a pain just inferior to the area of her pacemaker.  Pain resolved but then recurred later in the morning.  She had 1 episode where she felt sweaty.  She has since felt some generalized weakness.  Currently, she endorses continued generalized weakness but denies any current pain.     Home Medications Prior to Admission medications   Medication Sig Start Date End Date Taking? Authorizing Provider  acetaminophen (TYLENOL) 500 MG tablet Take 500 mg by mouth every 6 (six) hours as needed for mild pain.    [provider]  B Complex Vitamins (VITAMIN-B COMPLEX) TABS Take 1 tablet by mouth daily.    [provider]  docusate sodium (COLACE) 100 MG capsule Take 100 mg by mouth at bedtime.    [provider]  fluticasone (FLONASE) 50 MCG/ACT nasal spray Place 1-2 sprays into both nostrils daily.    [provider]  loperamide (IMODIUM A-D) 2 MG tablet Take 1 tablet (2 mg total) by mouth as needed for diarrhea or loose stools. 06/21/21   Ngetich, Dinah C, NP  Magnesium 200 MG TABS Take 200 mg by mouth in the morning and at bedtime.    [provider]  Multiple  Vitamins-Minerals (MULTI COMPLETE/IRON) TABS Take 1 tablet by mouth daily with breakfast. Folic acid included    [provider]  PHENobarbital (LUMINAL) 32.4 MG tablet Take 2 tablets (64.8 mg total) by mouth daily with breakfast. 01/10/22   Fargo, Amy E, NP  phenytoin (DILANTIN) 100 MG ER capsule Take 200 mg by mouth daily with breakfast.    [provider]  simvastatin (ZOCOR) 20 MG tablet Take 20 mg by mouth at bedtime.    [provider]  SYSTANE ULTRA PF 0.4-0.3 % SOLN Place 1 drop into both eyes in the morning and at bedtime.    [provider]  valsartan (DIOVAN) 160 MG tablet Take 1 tablet (160 mg total) by mouth daily. 12/11/21   Lenna Sciara, NP      Allergies    Azithromycin, Penicillins, Pravastatin, Sulfa drugs cross reactors, Tape, and Naproxen sodium    Review of Systems   Review of Systems  Constitutional:  Positive for diaphoresis.  Cardiovascular:  Positive for chest pain.  Gastrointestinal:  Positive for diarrhea.  Neurological:  Positive for weakness (Generalized).  All other systems reviewed and are negative.   Physical Exam Updated Vital Signs BP 103/67 (BP Location: Right Arm)   Pulse 69   Temp 98.3 F (36.8 C) (Oral)   Resp 16   SpO2 100%  Physical Exam Vitals and nursing note reviewed.  Constitutional:  General: She is not in acute distress.    Appearance: Normal appearance. She is well-developed and normal weight. She is not ill-appearing, toxic-appearing or diaphoretic.  HENT:     Head: Normocephalic and atraumatic.     Right Ear: External ear normal.     Left Ear: External ear normal.     Nose: Nose normal.     Mouth/Throat:     Mouth: Mucous membranes are moist.     Pharynx: Oropharynx is clear.  Eyes:     Extraocular Movements: Extraocular movements intact.     Conjunctiva/sclera: Conjunctivae normal.  Neck:     Vascular: JVD present.  Cardiovascular:     Rate and Rhythm: Normal rate and regular rhythm.      Heart sounds: No murmur heard. Pulmonary:     Effort: Pulmonary effort is normal. No respiratory distress.     Breath sounds: Normal breath sounds. No wheezing or rales.  Chest:     Chest wall: No tenderness.  Abdominal:     General: There is no distension.     Palpations: Abdomen is soft.     Tenderness: There is no abdominal tenderness.  Musculoskeletal:        General: No swelling. Normal range of motion.     Cervical back: Normal range of motion and neck supple.  Skin:    General: Skin is warm and dry.     Coloration: Skin is not jaundiced or pale.  Neurological:     General: No focal deficit present.     Mental Status: She is alert and oriented to person, place, and time.     Cranial Nerves: No cranial nerve deficit.     Sensory: No sensory deficit.     Motor: No weakness.     Coordination: Coordination normal.  Psychiatric:        Mood and Affect: Mood normal.        Behavior: Behavior normal.        Thought Content: Thought content normal.        Judgment: Judgment normal.     ED Results / Procedures / Treatments   Labs (all labs ordered are listed, but only abnormal results are displayed) Labs Reviewed  LIPASE, BLOOD - Abnormal; Notable for the following components:      Result Value   Lipase 53 (*)    All other components within normal limits  COMPREHENSIVE METABOLIC PANEL - Abnormal; Notable for the following components:   Glucose, Bld 126 (*)    BUN 28 (*)    Creatinine, Ser 1.29 (*)    GFR, Estimated 41 (*)    All other components within normal limits  CBC - Abnormal; Notable for the following components:   WBC 10.6 (*)    RBC 2.85 (*)    Hemoglobin 9.5 (*)    HCT 28.9 (*)    MCV 101.4 (*)    All other components within normal limits  TROPONIN I (HIGH SENSITIVITY) - Abnormal; Notable for the following components:   Troponin I (High Sensitivity) 35 (*)    All other components within normal limits  URINALYSIS, ROUTINE W REFLEX MICROSCOPIC     EKG None  Radiology DG Chest 2 View  Result Date: 01/27/2022 CLINICAL DATA:  Weakness.  Chest pain. EXAM: CHEST - 2 VIEW COMPARISON:  11/07/2021 and older exams. FINDINGS: Cardiac silhouette is mildly enlarged. Stable left anterior chest wall dual lead pacemaker. No mediastinal or hilar masses. No convincing adenopathy. Lungs are hyperexpanded with  prominent interstitial markings, otherwise clear. No pleural effusion or pneumothorax. No acute skeletal abnormality. IMPRESSION: No acute cardiopulmonary disease. Electronically Signed   By: Lajean Manes M.D.   On: 01/27/2022 09:21    Procedures Procedures  {Document cardiac monitor, telemetry assessment procedure when appropriate:1}  Medications Ordered in ED Medications - No data to display  ED Course/ Medical Decision Making/ A&P                           Medical Decision Making Amount and/or Complexity of Data Reviewed Labs: ordered. Radiology: ordered.   ***  {Document critical care time when appropriate:1} {Document review of labs and clinical decision tools ie heart score, Chads2Vasc2 etc:1}  {Document your independent review of radiology images, and any outside records:1} {Document your discussion with family members, caretakers, and with consultants:1} {Document social determinants of health affecting pt's care:1} {Document your decision making why or why not admission, treatments were needed:1} Final Clinical Impression(s) / ED Diagnoses Final diagnoses:  None    Rx / DC Orders ED Discharge Orders     None

## 2022-01-27 NOTE — Progress Notes (Signed)
Pt admitted to [facility] from [facility] ED.  Pt is A&OX4 and neuro intact.  Pt placed on telemetry and CCMD notified.  CHG bath completed.  Pt is currently comfortable and not in pain.  Pt is oriented to unit and call light within reach.    01/27/22 1800  Vitals  Temp 98.2 F (36.8 C)  Temp Source Oral  BP (!) 160/41  MAP (mmHg) 74  BP Location Left Arm  BP Method Automatic  Patient Position (if appropriate) Lying  Pulse Rate 74  Pulse Rate Source Radial  ECG Heart Rate 73  Resp 19  Level of Consciousness  Level of Consciousness Alert  Oxygen Therapy  SpO2 99 %  O2 Device Room Air  Pain Assessment  Pain Scale 0-10  Pain Score 0  POSS Scale (Pasero Opioid Sedation Scale)  POSS *See Group Information* 1-Acceptable,Awake and alert  Glasgow Coma Scale  Eye Opening 4  Best Verbal Response (NON-intubated) 5  Best Motor Response 6  Glasgow Coma Scale Score 15  MEWS Score  MEWS Temp 0  MEWS Systolic 0  MEWS Pulse 0  MEWS RR 0  MEWS LOC 0  MEWS Score 0  MEWS Score Color Green

## 2022-01-27 NOTE — H&P (Signed)
Cardiology Admission History and Physical:   Patient ID: [name] MRN: [id number]; DOB: [birth year]   Admission date: 01/27/2022  PCP:  Burnard Bunting, MD   [facility] Providers Cardiologist:  Peter Martinique, MD        Chief Complaint: Chest pressure  Patient Profile:   [name] is a 86 y.o. female with paroxysmal atrial fibrillation and complete heart block who is being seen 01/27/2022 for the evaluation of chest pressure.  History of Present Illness:   [name] is an 86 year old resident at [facility] who was woken repeatedly from sleep last night with precordial pressure.  The discomfort is located a couple of inches lower than her pacemaker site.  She insists that the pain is not sharp in nature.  The discomfort was distinctly better when she sat up and returned when she tried to lay back in bed.  It is worse if she tries to take a very deep breath, but it is never severe.  It lasted for several hours and still is present now if she takes a deep breath.  She does not have dyspnea and has not had cough or hemoptysis.  She is never aware of palpitations, including at this time when she is in atrial fibrillation.  About 2 weeks ago she had an episode of weakness and diaphoresis.  There is no corresponding arrhythmia recorded on her pacemaker around that time.  Otherwise she feels well.  She does not have exertional chest pressure and denies orthopnea or PND.  She does not have problems with lower extremity edema, claudication, focal neurological complaints, dizziness and she has not experienced syncope since the pacemaker was implanted in late May.   In 2010 she had a [place] Myoview that was mildly abnormal with a "small to moderate mid anterior wall defect consistent with ischemia".  An echocardiogram performed in early May showed LVEF 45-50% with global hypokinesis and indeterminate diastolic parameters.  The left atrium was described as moderately dilated.  She  bears a diagnosis of mitral valve prolapse, but none was seen on this most recent echo.  There was mild mitral insufficiency.  There was a trivial posterior pericardial effusion.  She has a history of hypertension and hypercholesterolemia both of these effectively treated with pharmacological means.  Despite a diagnosis of atrial fibrillation, anticoagulants have been avoided due to history of cerebral arteriovenous malformation.  She has never had symptoms of TIA or stroke.  Irrigation of her pacemaker (Medtronic Azure XT DR implanted 11/07/2021 by Dr. Crissie Sickles) shows normal device function.  All lead parameters are normal and unchanged from previous tests.  The burden of atrial fibrillation is 17.6%.  She has 3.5% atrial pacing and 95.4% ventricular pacing.  Activity is low but stable at about 0.5 hours/day.  There have been no episodes of high ventricular rates.  Past Medical History:  Diagnosis Date   Abnormal cardiovascular stress test    Carotid arterial disease (HCC)    MODERATE RIGHT   Cerebral AV malformation    DDD (degenerative disc disease)    SEVERE WITH SPINAL STENOSIS   Hypercholesterolemia    Hypertension    MVP (mitral valve prolapse)    MILD   Numbness    LEFT FOOT AND ANKLE   Osteopenia    Pseudo-gout     Past Surgical History:  Procedure Laterality Date   APPENDECTOMY     BREAST MASS EXCISION     CARPAL TUNNEL RELEASE     PACEMAKER IMPLANT N/A  11/07/2021   Procedure: PACEMAKER IMPLANT;  Surgeon: Evans Lance, MD;  Location: [facility];  Service: Cardiovascular;  Laterality: N/A;   REMOVAL OF PARATHYROID GLAND     STRESS NUCLEAR STUDY     ABNORMAL   TONSILLECTOMY       Medications Prior to Admission: Prior to Admission medications   Medication Sig Start Date End Date Taking? Authorizing Provider  acetaminophen (TYLENOL) 500 MG tablet Take 500 mg by mouth every 6 (six) hours as needed for mild pain.   Yes [provider]  B Complex  Vitamins (VITAMIN-B COMPLEX) TABS Take 1 tablet by mouth daily.   Yes [provider]  docusate sodium (COLACE) 100 MG capsule Take 100 mg by mouth at bedtime.   Yes [provider]  fluticasone (FLONASE) 50 MCG/ACT nasal spray Place 1-2 sprays into both nostrils daily.   Yes [provider]  loperamide (IMODIUM A-D) 2 MG tablet Take 1 tablet (2 mg total) by mouth as needed for diarrhea or loose stools. 06/21/21  Yes Ngetich, Dinah C, NP  Magnesium 200 MG TABS Take 400 mg by mouth in the morning and at bedtime.   Yes [provider]  Multiple Vitamins-Minerals (MULTI COMPLETE/IRON) TABS Take 1 tablet by mouth daily with breakfast. Folic acid included   Yes [provider]  PHENobarbital (LUMINAL) 32.4 MG tablet Take 2 tablets (64.8 mg total) by mouth daily with breakfast. 01/10/22  Yes Fargo, Amy E, NP  phenytoin (DILANTIN) 100 MG ER capsule Take 200 mg by mouth daily with breakfast.   Yes [provider]  simvastatin (ZOCOR) 20 MG tablet Take 20 mg by mouth at bedtime.   Yes [provider]  SYSTANE ULTRA PF 0.4-0.3 % SOLN Place 1 drop into both eyes in the morning and at bedtime.   Yes [provider]  valsartan (DIOVAN) 160 MG tablet Take 1 tablet (160 mg total) by mouth daily. 12/11/21  Yes Lenna Sciara, NP     Allergies:    Allergies  Allergen Reactions   Azithromycin Other (See Comments)    Reaction not recalled   Penicillins Hives and Other (See Comments)    Bad reaction as a child after a shot of Penicillin   Pravastatin Other (See Comments)    Reaction not recalled   Sulfa Drugs Cross Reactors Other (See Comments)    Reaction not recalled- stated she can take it "in small doses"   Tape Itching and Other (See Comments)    Cannot wear for an extended period of time   Naproxen Sodium Rash    Social History:   Social History   Socioeconomic History   Marital status: Widowed    Spouse name: Not on file    Number of children: 3   Years of education: Not on file   Highest education level: Not on file  Occupational History   Not on file  Tobacco Use   Smoking status: Former    Packs/day: 1.00    Years: 14.00    Total pack years: 14.00    Types: Cigarettes    Quit date: 11/07/1968    Years since quitting: 53.2   Smokeless tobacco: Never  Vaping Use   Vaping Use: Never used  Substance and Sexual Activity   Alcohol use: No   Drug use: No   Sexual activity: Not on file  Other Topics Concern   Not on file  Social History Narrative   [place] home [place]  [employer]    Social Determinants of Health   Financial Resource Strain: Not on file  Food Insecurity: Not on file  Transportation Needs: Not on file  Physical Activity: Not on file  Stress: Not on file  Social Connections: Not on file  Intimate Partner Violence: Not on file    Family History:   The patient's family history includes Lung cancer in her father; Stroke in her mother. There is no history of Breast cancer.    ROS:  Please see the history of present illness.  All other ROS reviewed and negative.     Physical Exam/Data:   Vitals:   01/27/22 0824 01/27/22 1208 01/27/22 1209  BP: 103/67 (!) 149/52   Pulse: 69 64   Resp: 16 18   Temp: 98.3 F (36.8 C)  98 F (36.7 C)  TempSrc: Oral  Oral  SpO2: 100% 99%    No intake or output data in the 24 hours ending 01/27/22 1446    12/14/2021   10:54 AM 12/04/2021    9:10 AM 11/21/2021    9:59 AM  Last 3 Weights  Weight (lbs) 136 lb 12.8 oz 139 lb 141 lb  Weight (kg) 62.052 kg 63.05 kg 63.957 kg     There is no height or weight on file to calculate BMI.  General:  Well nourished, well developed, in no acute distress HEENT: normal Neck: no JVD Vascular: No carotid bruits; Distal pulses 2+ bilaterally   Cardiac:   RRR; no murmur, no rubs or gallops.  Paradoxically split second heart sound.  The left subclavian pacemaker site is very well-healed without signs of  inflammation or infection. Lungs:  clear to auscultation bilaterally, no wheezing, rhonchi or rales  Abd: soft, nontender, no hepatomegaly  Ext: no edema Musculoskeletal:  No deformities, BUE and BLE strength normal and equal Skin: warm and dry  Neuro:  CNs 2-12 intact, no focal abnormalities noted Psych:  Normal affect    EKG:  The ECG that was done today was personally reviewed and demonstrates background atrial fibrillation with mostly ventricular paced rhythm and occasional ventricular sensed rhythm.  Ventricular sensed beats are most likely PVCs and are similar morphology with PVCs seen on a tracing in sinus rhythm on 10/16/2021.  Relevant CV Studies: Echocardiogram 10/16/2021   1. Left ventricular ejection fraction, by estimation, is 45 to 50%. The  left ventricle has mildly decreased function. The left ventricle  demonstrates global hypokinesis. There is mild left ventricular  hypertrophy. Left ventricular diastolic parameters  are indeterminate.   2. Right ventricular systolic function is normal. The right ventricular  size is normal.   3. Left atrial size was moderately dilated.   4. The pericardial effusion is posterior to the left ventricle.   5. The mitral valve is normal in structure. Mild mitral valve  regurgitation. No evidence of mitral stenosis.   6. The aortic valve is tricuspid. Aortic valve regurgitation is not  visualized. No aortic stenosis is present.   7. The inferior vena cava is normal in size with greater than 50%  respiratory variability, suggesting right atrial pressure of 3 mmHg.   Comprehensive interrogation of dual-chamber pacemaker by me in the emergency room today shows normal device function.  Scanned report  Laboratory Data:  High Sensitivity Troponin:   Recent Labs  Lab 01/27/22 0830 01/27/22 1116 01/27/22 1329  TROPONINIHS 35* 51* 55*      Chemistry Recent Labs  Lab 01/27/22 0830 01/27/22 1116  NA 137  --  K 4.0  --   CL 103  --    CO2 25  --   GLUCOSE 126*  --   BUN 28*  --   CREATININE 1.29*  --   CALCIUM 9.4  --   MG  --  1.7  GFRNONAA 41*  --   ANIONGAP 9  --     Recent Labs  Lab 01/27/22 0830  PROT 6.5  ALBUMIN 3.5  AST 22  ALT 12  ALKPHOS 72  BILITOT 0.6   Lipids No results for input(s): "CHOL", "TRIG", "HDL", "LABVLDL", "LDLCALC", "CHOLHDL" in the last 168 hours. Hematology Recent Labs  Lab 01/27/22 0830  WBC 10.6*  RBC 2.85*  HGB 9.5*  HCT 28.9*  MCV 101.4*  MCH 33.3  MCHC 32.9  RDW 13.0  PLT 257   Thyroid  Recent Labs  Lab 01/27/22 1116  TSH 1.914   BNPNo results for input(s): "BNP", "PROBNP" in the last 168 hours.  DDimer No results for input(s): "DDIMER" in the last 168 hours.   Radiology/Studies:  DG Chest 2 View  Result Date: 01/27/2022 CLINICAL DATA:  Weakness.  Chest pain. EXAM: CHEST - 2 VIEW COMPARISON:  11/07/2021 and older exams. FINDINGS: Cardiac silhouette is mildly enlarged. Stable left anterior chest wall dual lead pacemaker. No mediastinal or hilar masses. No convincing adenopathy. Lungs are hyperexpanded with prominent interstitial markings, otherwise clear. No pleural effusion or pneumothorax. No acute skeletal abnormality. IMPRESSION: No acute cardiopulmonary disease. Electronically Signed   By: Lajean Manes M.D.   On: 01/27/2022 09:21     Assessment and Plan:   Chest pressure: This is positional and related to breathing which would suggest pericardial source, but is also dull in nature, not sharp.  We will check an echocardiogram for evidence of pericardial fluid.  Lead parameters are normal and there is no suggestion of lead perforation based on electronic interrogation.  If there is no evidence of pericardial fluid on echocardiogram, would recommend ischemic work-up.  Since her rhythm is quite regular with ventricular pacing she would be a good candidate for coronary CT angiography. Complete heart block: Nearly 100% ventricular pacing.  Sensed beats are more  likely PVCs nonconducted beats.  ECG is not diagnostic for ischemic heart disease. Pacemaker: Normal device function.  Perforation would be unusual almost 3 months after device implantation. Paroxysmal atrial fibrillation: Burden appears to be stable at around 17%.  There does not appear to be any correlation between the arrhythmia and her symptoms.  She is oblivious to palpitations.  Due to increased risk of intracranial bleeding from arteriovenous malformation, the decision has been made not to treat with chronic anticoagulation. Abnormal high-sensitivity troponin: Raises concern for possible ischemic substrate for her symptoms.  Have decided not to treat with intravenous heparin unless we have more convincing evidence of ischemic etiology for her chest pressure, due to the concerns for intracranial hemorrhage outlined above.   Risk Assessment/Risk Scores:         CHA2DS2-VASc Score = 5   This indicates a 7.2% annual risk of stroke. The patient's score is based upon: CHF History: 0 HTN History: 1 Diabetes History: 0 Stroke History: 0 Vascular Disease History: 1 Age Score: 2 Gender Score: 1      Severity of Illness: The appropriate patient status for this patient is INPATIENT. Inpatient status is judged to be reasonable and necessary in order to provide the required intensity of service to ensure the patient's safety. The patient's presenting symptoms, physical exam findings,  and initial radiographic and laboratory data in the context of their chronic comorbidities is felt to place them at high risk for further clinical deterioration. Furthermore, it is not anticipated that the patient will be medically stable for discharge from the hospital within 2 midnights of admission.   * I certify that at the point of admission it is my clinical judgment that the patient will require inpatient hospital care spanning beyond 2 midnights from the point of admission due to high intensity of service,  high risk for further deterioration and high frequency of surveillance required.*   For questions or updates, please contact [facility] Please consult [email or web address] for contact info under     Signed, Sanda Klein, MD  01/27/2022 2:46 PM

## 2022-01-28 ENCOUNTER — Inpatient Hospital Stay (HOSPITAL_BASED_OUTPATIENT_CLINIC_OR_DEPARTMENT_OTHER): Payer: Medicare Other

## 2022-01-28 ENCOUNTER — Other Ambulatory Visit: Payer: Self-pay | Admitting: Cardiology

## 2022-01-28 DIAGNOSIS — R079 Chest pain, unspecified: Secondary | ICD-10-CM | POA: Diagnosis not present

## 2022-01-28 DIAGNOSIS — I442 Atrioventricular block, complete: Secondary | ICD-10-CM

## 2022-01-28 DIAGNOSIS — I10 Essential (primary) hypertension: Secondary | ICD-10-CM | POA: Diagnosis not present

## 2022-01-28 DIAGNOSIS — I309 Acute pericarditis, unspecified: Secondary | ICD-10-CM | POA: Diagnosis not present

## 2022-01-28 DIAGNOSIS — I700 Atherosclerosis of aorta: Secondary | ICD-10-CM | POA: Diagnosis not present

## 2022-01-28 DIAGNOSIS — I480 Paroxysmal atrial fibrillation: Secondary | ICD-10-CM

## 2022-01-28 DIAGNOSIS — I300 Acute nonspecific idiopathic pericarditis: Secondary | ICD-10-CM

## 2022-01-28 DIAGNOSIS — Z950 Presence of cardiac pacemaker: Secondary | ICD-10-CM | POA: Diagnosis not present

## 2022-01-28 DIAGNOSIS — I2510 Atherosclerotic heart disease of native coronary artery without angina pectoris: Secondary | ICD-10-CM | POA: Diagnosis not present

## 2022-01-28 DIAGNOSIS — Z79899 Other long term (current) drug therapy: Secondary | ICD-10-CM | POA: Diagnosis not present

## 2022-01-28 DIAGNOSIS — I3139 Other pericardial effusion (noninflammatory): Secondary | ICD-10-CM | POA: Diagnosis present

## 2022-01-28 DIAGNOSIS — Z87891 Personal history of nicotine dependence: Secondary | ICD-10-CM | POA: Diagnosis not present

## 2022-01-28 LAB — CBC
HCT: 25.1 % — ABNORMAL LOW (ref 36.0–46.0)
Hemoglobin: 8.4 g/dL — ABNORMAL LOW (ref 12.0–15.0)
MCH: 33.1 pg (ref 26.0–34.0)
MCHC: 33.5 g/dL (ref 30.0–36.0)
MCV: 98.8 fL (ref 80.0–100.0)
Platelets: 227 10*3/uL (ref 150–400)
RBC: 2.54 MIL/uL — ABNORMAL LOW (ref 3.87–5.11)
RDW: 13.1 % (ref 11.5–15.5)
WBC: 6.5 10*3/uL (ref 4.0–10.5)
nRBC: 0 % (ref 0.0–0.2)

## 2022-01-28 LAB — BASIC METABOLIC PANEL
Anion gap: 9 (ref 5–15)
BUN: 27 mg/dL — ABNORMAL HIGH (ref 8–23)
CO2: 24 mmol/L (ref 22–32)
Calcium: 9 mg/dL (ref 8.9–10.3)
Chloride: 102 mmol/L (ref 98–111)
Creatinine, Ser: 1.08 mg/dL — ABNORMAL HIGH (ref 0.44–1.00)
GFR, Estimated: 51 mL/min — ABNORMAL LOW (ref >60)
Glucose, Bld: 95 mg/dL (ref 70–99)
Potassium: 3.9 mmol/L (ref 3.5–5.1)
Sodium: 135 mmol/L (ref 135–145)

## 2022-01-28 LAB — C-REACTIVE PROTEIN: CRP: 7 mg/dL — ABNORMAL HIGH (ref <1.0)

## 2022-01-28 LAB — LIPID PANEL
Cholesterol: 208 mg/dL — ABNORMAL HIGH (ref 0–200)
HDL: 80 mg/dL (ref >40)
LDL Cholesterol: 116 mg/dL — ABNORMAL HIGH (ref 0–99)
Total CHOL/HDL Ratio: 2.6 RATIO
Triglycerides: 59 mg/dL (ref <150)
VLDL: 12 mg/dL (ref 0–40)

## 2022-01-28 LAB — ECHOCARDIOGRAM COMPLETE
Area-P 1/2: 5.4 cm2
Height: 66 in
MV VTI: 1.08 cm2
S' Lateral: 3.45 cm
Weight: 2147.2 oz

## 2022-01-28 LAB — GLUCOSE, CAPILLARY: Glucose-Capillary: 82 mg/dL (ref 70–99)

## 2022-01-28 LAB — SEDIMENTATION RATE: Sed Rate: 52 mm/hr — ABNORMAL HIGH (ref 0–22)

## 2022-01-28 MED ORDER — COLCHICINE 0.6 MG PO TABS: 0.6000 mg | ORAL_TABLET | Freq: Every day | ORAL | 0 refills | Status: DC

## 2022-01-28 MED ORDER — COLCHICINE 0.6 MG PO TABS: 0.6000 mg | ORAL_TABLET | Freq: Every day | ORAL | Status: DC

## 2022-01-28 MED ORDER — COLCHICINE 0.6 MG PO TABS
0.6000 mg | ORAL_TABLET | Freq: Every day | ORAL | Status: DC
  Administered 2022-01-28: 0.6 mg via ORAL
  Filled 2022-01-28: qty 1

## 2022-01-28 NOTE — Discharge Instructions (Signed)
We started new medication colchicine 0.6 mg daily first dose 01/28/22 at [facility].    Heart Healthy diet  Keep follow up appts.

## 2022-01-28 NOTE — Progress Notes (Signed)
  Echocardiogram 2D Echocardiogram has been performed.  [name] 01/28/2022, 2:52 PM

## 2022-01-28 NOTE — Progress Notes (Addendum)
Echo shows a moderate-sized pericardial effusion without signs of tamponade. ESR and CRP are both markedly elevated. She is essentially asymptomatic at this time. We will avoid NSAIDs (although she can take as needed ibuprofen if the discomfort returns), but we will place her on colchicine 0.6 mg once daily to reduce the likelihood of recurrence.  Noted interaction between phenytoin and colchicine, but this will not increase the risk of side effects (may reduce the efficacy of the anti-inflammatory drug). Note that there was already a trivial pericardial effusion on the echocardiogram from 10/16/2021, before pacemaker implantation. No neoplastic etiology of the pericardial effusion appears evident based on CT chest findings on the coronary CTA images.  No other findings to suggest a more comprehensive autoimmune disorder, other than the anemia which is I think more likely related to chronic phenytoin therapy. Can use a follow-up limited echo, ESR and CRP to follow progression of the inflammatory pericarditis.  We will perform a first check in about 10 days. She would benefit from treatment with a statin due to the presence of moderate  nonobstructive coronary artery disease, but administration of statin can be delayed until she finishes treatment with colchicine due to the risk of serious muscular side effects. Already has follow-up visits scheduled with Dr. Martinique and Dr. Lovena Le in the next couple of weeks. Educated about the symptoms of pericardial tamponade and when she should seek urgent medical attention. Consider referral to hematology for work-up of her anemia.

## 2022-01-28 NOTE — TOC Transition Note (Signed)
Transition of Care [facility]) - CM/SW Discharge Note   Patient Details  Name: [name] MRN: [id number] Date of Birth: 07-23-1935  Transition of Care [facility] CM/SW Contact:  Amador Cunas, LCSW Phone Number: 01/28/2022, 5:16 PM   Clinical Narrative:  Pt for dc back to [facility]. Spoke to [place] at [facility] [phone] who confirmed pt is able to return. DC summary faxed to facility [phone], Ermalinda Barrios confirmed it was received. RN provided with number for report. Dtr at bedside and will provide transport. SW signing off at dc.   Wandra Feinstein, MSW, LCSW [phone] (coverage)       Final next level of care: Assisted Living     Patient Goals and CMS Choice        Discharge Placement              Patient chooses bed at: [facility] Patient to be transferred to facility by: Daughter Name of family member notified: Webb Silversmith Patient and family notified of of transfer: 01/28/22  Discharge Plan and Services                                     Social Determinants of Health (SDOH) Interventions     Readmission Risk Interventions     No data to display

## 2022-01-28 NOTE — Progress Notes (Signed)
Report given to Monico Hoar at [facility] facility.

## 2022-01-28 NOTE — Progress Notes (Signed)
Pt discharged back to [facility] via family. Pt taken off telemetry and CCMD notified.  Pt's IV's removed.  Pt left with all of their personal belongings.  AVS documentation reviewed and sent home with Pt and all questions answered.

## 2022-01-28 NOTE — Progress Notes (Signed)
Progress Note  Patient Name: [name] Date of Encounter: 01/28/2022  [facility] Cardiologist: Peter Martinique, MD   Subjective   Less discomfort this morning.  Able to lie back in bed without chest pressure. Coronary CT angiogram shows moderate coronary atherosclerosis without meaningful obstruction and also shows evidence of a small to moderate pericardial effusion around the lateral wall of the left ventricle.  Inpatient Medications    Scheduled Meds:  irbesartan  150 mg Oral Daily   magnesium oxide  400 mg Oral Daily   PHENobarbital  64.8 mg Oral Q breakfast   phenytoin  200 mg Oral Q breakfast   Continuous Infusions:  PRN Meds: acetaminophen, nitroGLYCERIN, nitroGLYCERIN, ondansetron (ZOFRAN) IV   Vital Signs    Vitals:   01/27/22 2341 01/28/22 0341 01/28/22 0431 01/28/22 0834  BP: (!) 144/46 (!) 152/56  (!) 160/47  Pulse: 86 88  88  Resp: '16 14  18  '$ Temp: 98 F (36.7 C) 98 F (36.7 C)  98.2 F (36.8 C)  TempSrc: Oral Oral  Oral  SpO2: 98% 98%  97%  Weight:   60.9 kg   Height:   '5\' 6"'$  (1.676 m)     Intake/Output Summary (Last 24 hours) at 01/28/2022 1044 Last data filed at 01/28/2022 0440 Gross per 24 hour  Intake --  Output 400 ml  Net -400 ml      01/28/2022    4:31 AM 12/14/2021   10:54 AM 12/04/2021    9:10 AM  Last 3 Weights  Weight (lbs) 134 lb 3.2 oz 136 lb 12.8 oz 139 lb  Weight (kg) 60.873 kg 62.052 kg 63.05 kg      Telemetry    Atrial sensed (sinus), ventricular paced rhythm- Personally Reviewed  ECG    No new tracings- Personally Reviewed  Physical Exam  Appears well, comfortable lying with head of bed elevated at only 10 degrees GEN: No acute distress.   Neck: No JVD Cardiac: RRR, normal S1-S2, no gallops.  There is a distinct pericardial rub heard today, possibly also a holosystolic murmur at the left lower sternal border Respiratory: Clear to auscultation bilaterally. GI: Soft, nontender, non-distended  MS: No edema; No  deformity. Neuro:  Nonfocal  Psych: Normal affect   Labs    High Sensitivity Troponin:   Recent Labs  Lab 01/27/22 0830 01/27/22 1116 01/27/22 1329 01/27/22 1535  TROPONINIHS 35* 51* 55* 60*     Chemistry Recent Labs  Lab 01/27/22 0830 01/27/22 1116 01/28/22 0331  NA 137  --  135  K 4.0  --  3.9  CL 103  --  102  CO2 25  --  24  GLUCOSE 126*  --  95  BUN 28*  --  27*  CREATININE 1.29*  --  1.08*  CALCIUM 9.4  --  9.0  MG  --  1.7  --   PROT 6.5  --   --   ALBUMIN 3.5  --   --   AST 22  --   --   ALT 12  --   --   ALKPHOS 72  --   --   BILITOT 0.6  --   --   GFRNONAA 41*  --  51*  ANIONGAP 9  --  9    Lipids No results for input(s): "CHOL", "TRIG", "HDL", "LABVLDL", "LDLCALC", "CHOLHDL" in the last 168 hours.  Hematology Recent Labs  Lab 01/27/22 0830 01/28/22 0331  WBC 10.6* 6.5  RBC 2.85*  2.54*  HGB 9.5* 8.4*  HCT 28.9* 25.1*  MCV 101.4* 98.8  MCH 33.3 33.1  MCHC 32.9 33.5  RDW 13.0 13.1  PLT 257 227   Thyroid  Recent Labs  Lab 01/27/22 1116  TSH 1.914    BNPNo results for input(s): "BNP", "PROBNP" in the last 168 hours.  DDimer No results for input(s): "DDIMER" in the last 168 hours.   Radiology    CT CORONARY MORPH W/CTA COR W/SCORE W/CA W/CM &/OR WO/CM  Addendum Date: 01/27/2022   ADDENDUM REPORT: 01/27/2022 19:45 CLINICAL DATA:  17F with chest pain EXAM: Cardiac/Coronary CTA TECHNIQUE: The patient was scanned on a [employer]. FINDINGS: A 100 kV prospective scan was triggered in the descending thoracic aorta at 111 HU's. Axial non-contrast 3 mm slices were carried out through the heart. The data set was analyzed on a dedicated work station and scored using the [place]. Gantry rotation speed was 250 msecs and collimation was .6 mm. No beta blockade and 0.8 mg of sl NTG was given. The 3D data set was reconstructed in 5% intervals of the 35-75 % of the R-R cycle. Phases were analyzed on a dedicated work station using MPR, MIP and  VRT modes. The patient received 100 cc of contrast. Coronary Arteries:  Normal coronary origin.  Right dominance. RCA is a large dominant artery that gives rise to PDA and PLA. Calcified plaque in ostial RCA causes 0-24% stenosis. Calcified plaque in proximal RCA causes 25-49% stenosis. Left main is a large artery that gives rise to LAD and LCX arteries. Calcified plaque in ostial left main causes 0-24% stenosis LAD is a large vessel. Calcified plaque in proximal LAD causes 0-24% stenosis. LCX is a non-dominant artery that gives rise to one large OM1 branch. There is no plaque. Other findings: Left Ventricle: Normal size Left Atrium: Mild enlargement Pulmonary Veins: Normal configuration Right Ventricle: Normal size Right Atrium: Normal size.  Dual chamber pacemaker Thoracic aorta: Normal size Pulmonary Arteries: Normal size Systemic Veins: Normal drainage Pericardium: Moderate pericardial effusion, primarily located adjacent to the posterior/lateral walls of the LV IMPRESSION: 1. Technically difficult study due to multiple PVCs during acquisition. With ECG editing study is largely interpretable, though distal RCA remains uninterpretable 2. Coronary calcium score of 329. This was 67th percentile for age and sex matched control. 3. Normal coronary origin with right dominance. 4. Nonobstructive CAD 5. Calcified plaque in proximal RCA causes mild (25-49%) stenosis. 6. Calcified plaque causes minimal (0-24%) stenosis in ostial left main, ostial RCA, and proximal LAD 7. Moderate pericardial effusion, primarily located adjacent to the posterior/lateral walls of the LV CAD-RADS 2. Mild non-obstructive CAD (25-49%). Consider non-atherosclerotic causes of chest pain. Consider preventive therapy and risk factor modification. Electronically Signed   By: Oswaldo Milian M.D.   On: 01/27/2022 19:45   Result Date: 01/27/2022 EXAM: OVER-READ INTERPRETATION  CT CHEST The following report is a limited chest CT over-read  performed by radiologist Dr. Rolm Baptise of [facility], [place] on 01/27/2022. This over-read does not include interpretation of cardiac or coronary anatomy or pathology. The coronary CTA interpretation by the cardiologist is attached. COMPARISON:  None Available. FINDINGS: Vascular: Mild cardiomegaly. Pacer wires in the right atrium and right ventricle. Moderate aortic calcifications. No evidence of aortic aneurysm. Small pericardial effusion. Mediastinum/Nodes: No adenopathy Lungs/Pleura: Left basilar atelectasis or scarring.  No effusions. Upper Abdomen: No acute findings. Musculoskeletal: Chest wall soft tissues are unremarkable. No acute bony abnormality. IMPRESSION: Cardiomegaly.  Small pericardial effusion.  Left lower lobe atelectasis or scarring. Aortic atherosclerosis. Electronically Signed: By: Rolm Baptise M.D. On: 01/27/2022 17:54   DG Chest 2 View  Result Date: 01/27/2022 CLINICAL DATA:  Weakness.  Chest pain. EXAM: CHEST - 2 VIEW COMPARISON:  11/07/2021 and older exams. FINDINGS: Cardiac silhouette is mildly enlarged. Stable left anterior chest wall dual lead pacemaker. No mediastinal or hilar masses. No convincing adenopathy. Lungs are hyperexpanded with prominent interstitial markings, otherwise clear. No pleural effusion or pneumothorax. No acute skeletal abnormality. IMPRESSION: No acute cardiopulmonary disease. Electronically Signed   By: Lajean Manes M.D.   On: 01/27/2022 09:21    Cardiac Studies  Echocardiogram pending Coronary CT angiogram 01/27/2022  IMPRESSION: 1. Technically difficult study due to multiple PVCs during acquisition. With ECG editing study is largely interpretable, though distal RCA remains uninterpretable 2. Coronary calcium score of 329. This was 67th percentile for age and sex matched control. 3. Normal coronary origin with right dominance. 4. Nonobstructive CAD 5. Calcified plaque in proximal RCA causes mild (25-49%) stenosis. 6. Calcified plaque causes  minimal (0-24%) stenosis in ostial left main, ostial RCA, and proximal LAD 7. Moderate pericardial effusion, primarily located adjacent to the posterior/lateral walls of the LV CAD-RADS 2. Mild non-obstructive CAD (25-49%). Consider non-atherosclerotic causes of chest pain. Consider preventive therapy and risk factor modification.   Patient Profile     86 y.o. female with history of complete heart block and paroxysmal atrial fibrillation, roughly 3 months s/p implantation of dual-chamber permanent pacemaker, presents with chest discomfort (positional and related to respiration) and mild elevation in high-sensitivity troponin  Assessment & Plan    Pericardial effusion: Coronary CT findings support the impression that she has acute pericarditis.  ECG is nondiagnostic due to ventricular pacing.  She has no clinical evidence of tamponade and the effusion appears to be small-moderate.  We will get an echocardiogram today and also check inflammatory markers that we can use to follow the problem.  Pacemaker lead parameters are all normal, making a delayed microperforation an unlikely cause of acute pericarditis.  It is possible that she simply has viral pericarditis.  No other findings to suggest neoplastic etiology on chest CT.  Would like to avoid nonsteroidal anti-inflammatory drugs if possible, especially since her symptoms are quite mild.  On the other hand colchicine may not be an effective choice since she is taking phenytoin.  Reassess after echocardiogram.  Simply decides to treat with observation. Complete heart block/pacemaker: Normal pacemaker check performed yesterday. Physical atrial fibrillation: Not on anticoagulants due to history of cerebral arteriovenous malformation.  No history of TIA/stroke or other embolic events.  Overall burden is roughly 16-17% based on pacemaker downloads.  Was in atrial fibrillation last night, back in normal rhythm today, she cannot tell the difference. Coronary  atherosclerosis: Coronary calcium score is just slightly higher than average for age and gender.  Also has aortic atherosclerosis.  No lipid profile available for review.  Will order that.  Anemia:  moderate normocytic (borderline microcytic) normochromic anemia which is a chronic finding, dating back at least 18 months.  Iron studies were consistent with iron deficiency in February 2022, but had normalized by November 2022.  Normal folate and B12 levels in past.  Etiology is uncertain but could be related to use of phenytoin or myelodysplasia.  Appropriate for outpatient work-up.  Possible discharge later today based on echo findings.  For questions or updates, please contact [facility] Please consult [email or web address] for contact info under  Signed, Sanda Klein, MD  01/28/2022, 10:44 AM

## 2022-01-28 NOTE — Care Management Obs Status (Signed)
[facility] NOTIFICATION   Patient Details  Name: [name] MRN: [id number] Date of Birth: 1935/08/03   Medicare Observation Status Notification Given:  Yes    Dawayne Patricia, RN 01/28/2022, 5:06 PM

## 2022-01-28 NOTE — Social Work (Addendum)
CSW was alerted that pt was medically ready for DC. CSW called facility and was directed to call another number and no one answered. CSW was unable to confirm pt return to facility today.  RNCM confirmed that pt just moved to the ALF side at [facility].  CSW and colleague have attempted to call the number that was provided [phone] and was unable to speak with anyone.  Finally was able to reach someone at another number [phone].  Pt daughter to transport.

## 2022-01-28 NOTE — Discharge Summary (Signed)
Discharge Summary    Patient ID: [name] MRN: [id number]; DOB: [birth year]  Admit date: 01/27/2022 Discharge date: 01/28/2022  PCP:  Burnard Bunting, MD   [facility] Providers Cardiologist:  Peter Martinique, MD        Discharge Diagnoses    Principal Problem:   Acute pericarditis Active Problems:   Chest pain   Pacemaker   Heart block AV complete [facility])   Paroxysmal atrial fibrillation [facility]   Aortic atherosclerosis (HCC)   Pericardial effusion without cardiac tamponade    Diagnostic Studies/Procedures    Coronary CTA 01/27/22  __Coronary Arteries:  Normal coronary origin.  Right dominance.   RCA is a large dominant artery that gives rise to PDA and PLA. Calcified plaque in ostial RCA causes 0-24% stenosis. Calcified plaque in proximal RCA causes 25-49% stenosis.   Left main is a large artery that gives rise to LAD and LCX arteries. Calcified plaque in ostial left main causes 0-24% stenosis   LAD is a large vessel. Calcified plaque in proximal LAD causes 0-24% stenosis.   LCX is a non-dominant artery that gives rise to one large OM1 branch. There is no plaque.   Other findings:   Left Ventricle: Normal size   Left Atrium: Mild enlargement   Pulmonary Veins: Normal configuration   Right Ventricle: Normal size   Right Atrium: Normal size.  Dual chamber pacemaker   Thoracic aorta: Normal size   Pulmonary Arteries: Normal size   Systemic Veins: Normal drainage   Pericardium: Moderate pericardial effusion, primarily located adjacent to the posterior/lateral walls of the LV  IMPRESSION: 1. Technically difficult study due to multiple PVCs during acquisition. With ECG editing study is largely interpretable, though distal RCA remains uninterpretable   2. Coronary calcium score of 329. This was 67th percentile for age and sex matched control.   3. Normal coronary origin with right dominance.   4. Nonobstructive CAD   5. Calcified plaque in  proximal RCA causes mild (25-49%) stenosis.   6. Calcified plaque causes minimal (0-24%) stenosis in ostial left main, ostial RCA, and proximal LAD   7. Moderate pericardial effusion, primarily located adjacent to the posterior/lateral walls of the LV  OVER Read by Radiologist     IMPRESSION: Cardiomegaly.  Small pericardial effusion.   Left lower lobe atelectasis or scarring.   Aortic atherosclerosis.   Echo 01/28/22 IMPRESSIONS     1. Left ventricular ejection fraction, by estimation, is 50 to 55%. The  left ventricle has low normal function. The left ventricle has no regional  wall motion abnormalities. Indeterminate diastolic filling due to E-A  fusion.   2. Right ventricular systolic function is normal. The right ventricular  size is normal. There is mildly elevated pulmonary artery systolic  pressure.   3. Left atrial size was mildly dilated.   4. Right atrial size was mildly dilated.   5. Moderate pericardial effusion. The pericardial effusion is  circumferential. There is no evidence of cardiac tamponade.   6. The mitral valve is normal in structure. Mild mitral valve  regurgitation.   7. The aortic valve is grossly normal. Aortic valve regurgitation is not  visualized. No aortic stenosis is present.   8. The inferior vena cava is normal in size with greater than 50%  respiratory variability, suggesting right atrial pressure of 3 mmHg.   Comparison(s): Prior images reviewed side by side. The pericardial  effusion is larger.   FINDINGS   Left Ventricle: Left ventricular ejection fraction, by estimation, is  50  to 55%. The left ventricle has low normal function. The left ventricle has  no regional wall motion abnormalities. The left ventricular internal  cavity size was normal in size.  There is no left ventricular hypertrophy. Abnormal (paradoxical) septal  motion, consistent with RV pacemaker. Indeterminate diastolic filling due  to E-A fusion.   Right  Ventricle: The right ventricular size is normal. No increase in  right ventricular wall thickness. Right ventricular systolic function is  normal. There is mildly elevated pulmonary artery systolic pressure. The  tricuspid regurgitant velocity is 3.04   m/s, and with an assumed right atrial pressure of 3 mmHg, the estimated  right ventricular systolic pressure is 66.5 mmHg.   Left Atrium: Left atrial size was mildly dilated.   Right Atrium: Right atrial size was mildly dilated.   Pericardium: A moderately sized pericardial effusion is present. The  pericardial effusion is circumferential. There is no evidence of cardiac  tamponade.   Mitral Valve: The mitral valve is normal in structure. Mild mitral valve  regurgitation. MV peak gradient, 15.2 mmHg. The mean mitral valve gradient  is 4.0 mmHg.   Tricuspid Valve: The tricuspid valve is normal in structure. Tricuspid  valve regurgitation is not demonstrated.   Aortic Valve: The aortic valve is grossly normal. Aortic valve  regurgitation is not visualized. No aortic stenosis is present.   Pulmonic Valve: The pulmonic valve was normal in structure. Pulmonic valve  regurgitation is trivial.   Aorta: The aortic root is normal in size and structure.   Venous: The inferior vena cava is normal in size with greater than 50%  respiratory variability, suggesting right atrial pressure of 3 mmHg.   IAS/Shunts: No atrial level shunt detected by color flow Doppler.   ___________   History of Present Illness     HAZLEY DEZEEUW is a 86 y.o. female with  paroxysmal atrial fibrillation and complete heart block with PPM 11/07/21.  RBBB, MV prolapse, HTN, HLD, carotid artery disease, DDD, cerebral AV malformation, complex partial seizures, iron def anemia.  She is not an anticoagulation candidate due to the history of  cerebral AVM's. She would not be a Watchman candidate due to the fact that she can not take any anticoagulation.    She presented  to ER by EMS with chest pain, while at rest and also complaints of weakness and diarrhea.  She woke several times night before admit with discomfort a couple of inches lower than her PPM site.  Not sharp in nature.  The discomfort improved with sitting up, and worse if takes a deep breath.  No exertional chest pain.  HS troponin 35, 51  Hgb 9.5 WBC 10.6 Cr 1.29 all on admit.   She was admitted to eval for pericardial fluid.  No suggestion of lead perforation on electronic interrogation.   Plans for in pt coronary CTA.  Her a fib appears to be stable around 17%, no correlation between the arrhythmia and her symptoms.   Hospital Course     Consultants: none   Today pt had improved and was able to lie back in bed without chest pressure.  No significant CAD on Cardiac CTA.   Pericardial effusion is noted and echo was ordered.   Pacemaker lead parameters are all normal, making a delayed microperforation an unlikely cause of acute pericarditis.  It is possible that she simply has viral pericarditis.  Lipids with LDL 116, Tchol 208 and HDL 80. Will add statin once she has finished  colchicine due to risk of serious muscular side effects.   LPa in process.  Dose may need to be adjusted as outpt.   C-reactive prot 7.0  sed rate 52.  Mod size pericardial effusion on echo today without signs of tamponade.  Note that there was already a trivial pericardial effusion on the echocardiogram from 10/16/2021, before pacemaker implantation. We will avoid NSAIDs (although she can take as needed ibuprofen if the discomfort returns), but we will place her on colchicine 0.6 mg once daily to reduce the likelihood of recurrence.  Noted interaction between phenytoin and colchicine, but this will not increase the risk of side effects (may reduce the efficacy of the anti-inflammatory drug).  Dr. Sallyanne Kuster has discussed symptoms of pericardial tamponade and when to see urgent medical attention. He has seen and examined and cleared  for discharge.     Did the patient have an acute coronary syndrome (MI, NSTEMI, STEMI, etc) this admission?:  No                               Did the patient have a percutaneous coronary intervention (stent / angioplasty)?:  No.          _____________  Discharge Vitals Blood pressure (!) 151/47, pulse 90, temperature 98.5 F (36.9 C), temperature source Oral, resp. rate 20, height 5' 6"  (1.676 m), weight 60.9 kg, SpO2 98 %.  Filed Weights   01/28/22 0431  Weight: 60.9 kg    Labs & Radiologic Studies    CBC Recent Labs    01/27/22 0830 01/28/22 0331  WBC 10.6* 6.5  HGB 9.5* 8.4*  HCT 28.9* 25.1*  MCV 101.4* 98.8  PLT 257 505   Basic Metabolic Panel Recent Labs    01/27/22 0830 01/27/22 1116 01/28/22 0331  NA 137  --  135  K 4.0  --  3.9  CL 103  --  102  CO2 25  --  24  GLUCOSE 126*  --  95  BUN 28*  --  27*  CREATININE 1.29*  --  1.08*  CALCIUM 9.4  --  9.0  MG  --  1.7  --    Liver Function Tests Recent Labs    01/27/22 0830  AST 22  ALT 12  ALKPHOS 72  BILITOT 0.6  PROT 6.5  ALBUMIN 3.5   Recent Labs    01/27/22 0830  LIPASE 53*   High Sensitivity Troponin:   Recent Labs  Lab 01/27/22 0830 01/27/22 1116 01/27/22 1329 01/27/22 1535  TROPONINIHS 35* 51* 55* 60*    BNP Invalid input(s): "POCBNP" D-Dimer No results for input(s): "DDIMER" in the last 72 hours. Hemoglobin A1C No results for input(s): "HGBA1C" in the last 72 hours. Fasting Lipid Panel Recent Labs    01/28/22 1254  CHOL 208*  HDL 80  LDLCALC 116*  TRIG 59  CHOLHDL 2.6   Thyroid Function Tests Recent Labs    01/27/22 1116  TSH 1.914   _____________  ECHOCARDIOGRAM COMPLETE  Result Date: 01/28/2022    ECHOCARDIOGRAM REPORT   Patient Name:   SHANETA CERVENKA Date of Exam: 01/28/2022 Medical Rec #:  [id number]        Height:       66.0 in Accession #:    [id number]       Weight:       134.2 lb Date of Birth:  30-Jun-1935  BSA:          1.688 m Patient Age:     22 years         BP:           151/47 mmHg Patient Gender: F                HR:           92 bpm. Exam Location:  Inpatient Procedure: 2D Echo, Cardiac Doppler and Color Doppler Indications:    Chest Pain  History:        Patient has prior history of Echocardiogram examinations, most                 recent 10/16/2021. Pacemaker, Mitral Valve Prolapse; Risk                 Factors:Hypertension.  Sonographer:    Eartha Inch Referring Phys: [phone] MIHAI CROITORU  Sonographer Comments: Suboptimal apical window. Image acquisition challenging due to patient body habitus. IMPRESSIONS  1. Left ventricular ejection fraction, by estimation, is 50 to 55%. The left ventricle has low normal function. The left ventricle has no regional wall motion abnormalities. Indeterminate diastolic filling due to E-A fusion.  2. Right ventricular systolic function is normal. The right ventricular size is normal. There is mildly elevated pulmonary artery systolic pressure.  3. Left atrial size was mildly dilated.  4. Right atrial size was mildly dilated.  5. Moderate pericardial effusion. The pericardial effusion is circumferential. There is no evidence of cardiac tamponade.  6. The mitral valve is normal in structure. Mild mitral valve regurgitation.  7. The aortic valve is grossly normal. Aortic valve regurgitation is not visualized. No aortic stenosis is present.  8. The inferior vena cava is normal in size with greater than 50% respiratory variability, suggesting right atrial pressure of 3 mmHg. Comparison(s): Prior images reviewed side by side. The pericardial effusion is larger. FINDINGS  Left Ventricle: Left ventricular ejection fraction, by estimation, is 50 to 55%. The left ventricle has low normal function. The left ventricle has no regional wall motion abnormalities. The left ventricular internal cavity size was normal in size. There is no left ventricular hypertrophy. Abnormal (paradoxical) septal motion, consistent with RV  pacemaker. Indeterminate diastolic filling due to E-A fusion. Right Ventricle: The right ventricular size is normal. No increase in right ventricular wall thickness. Right ventricular systolic function is normal. There is mildly elevated pulmonary artery systolic pressure. The tricuspid regurgitant velocity is 3.04  m/s, and with an assumed right atrial pressure of 3 mmHg, the estimated right ventricular systolic pressure is 95.2 mmHg. Left Atrium: Left atrial size was mildly dilated. Right Atrium: Right atrial size was mildly dilated. Pericardium: A moderately sized pericardial effusion is present. The pericardial effusion is circumferential. There is no evidence of cardiac tamponade. Mitral Valve: The mitral valve is normal in structure. Mild mitral valve regurgitation. MV peak gradient, 15.2 mmHg. The mean mitral valve gradient is 4.0 mmHg. Tricuspid Valve: The tricuspid valve is normal in structure. Tricuspid valve regurgitation is not demonstrated. Aortic Valve: The aortic valve is grossly normal. Aortic valve regurgitation is not visualized. No aortic stenosis is present. Pulmonic Valve: The pulmonic valve was normal in structure. Pulmonic valve regurgitation is trivial. Aorta: The aortic root is normal in size and structure. Venous: The inferior vena cava is normal in size with greater than 50% respiratory variability, suggesting right atrial pressure of 3 mmHg. IAS/Shunts: No atrial level shunt detected by color flow  Doppler.  LEFT VENTRICLE PLAX 2D LVIDd:         4.60 cm   Diastology LVIDs:         3.45 cm   LV e' medial:    9.16 cm/s LV PW:         1.10 cm   LV E/e' medial:  14.3 LV IVS:        1.10 cm   LV e' lateral:   11.80 cm/s LVOT diam:     1.90 cm   LV E/e' lateral: 11.1 LV SV:         65 LV SV Index:   38 LVOT Area:     2.84 cm  RIGHT VENTRICLE             IVC RV S prime:     15.80 cm/s  IVC diam: 1.10 cm TAPSE (M-mode): 1.8 cm LEFT ATRIUM             Index        RIGHT ATRIUM           Index LA  diam:        3.80 cm 2.25 cm/m   RA Area:     13.60 cm LA Vol (A2C):   76.1 ml 45.09 ml/m  RA Volume:   27.90 ml  16.53 ml/m LA Vol (A4C):   69.3 ml 41.06 ml/m LA Biplane Vol: 73.1 ml 43.31 ml/m  AORTIC VALVE LVOT Vmax:   108.00 cm/s LVOT Vmean:  88.100 cm/s LVOT VTI:    0.228 m  AORTA Ao Root diam: 2.70 cm MITRAL VALVE                TRICUSPID VALVE MV Area (PHT): 5.40 cm     TR Peak grad:   37.0 mmHg MV Area VTI:   1.08 cm     TR Vmax:        304.00 cm/s MV Peak grad:  15.2 mmHg MV Mean grad:  4.0 mmHg     SHUNTS MV Vmax:       1.95 m/s     Systemic VTI:  0.23 m MV Vmean:      96.0 cm/s    Systemic Diam: 1.90 cm MV Decel Time: 141 msec MV E velocity: 131.00 cm/s MV A velocity: 56.25 cm/s MV E/A ratio:  2.33 Mihai Croitoru MD Electronically signed by Sanda Klein MD Signature Date/Time: 01/28/2022/2:56:46 PM    Final    CT CORONARY MORPH W/CTA COR W/SCORE Lewanda Rife W/CM &/OR WO/CM  Addendum Date: 01/27/2022   ADDENDUM REPORT: 01/27/2022 19:45 CLINICAL DATA:  74F with chest pain EXAM: Cardiac/Coronary CTA TECHNIQUE: The patient was scanned on a [employer]. FINDINGS: A 100 kV prospective scan was triggered in the descending thoracic aorta at 111 HU's. Axial non-contrast 3 mm slices were carried out through the heart. The data set was analyzed on a dedicated work station and scored using the [employer]. Gantry rotation speed was 250 msecs and collimation was .6 mm. No beta blockade and 0.8 mg of sl NTG was given. The 3D data set was reconstructed in 5% intervals of the 35-75 % of the R-R cycle. Phases were analyzed on a dedicated work station using MPR, MIP and VRT modes. The patient received 100 cc of contrast. Coronary Arteries:  Normal coronary origin.  Right dominance. RCA is a large dominant artery that gives rise to PDA and PLA. Calcified plaque in ostial RCA causes 0-24% stenosis. Calcified plaque in  proximal RCA causes 25-49% stenosis. Left main is a large artery that gives rise to LAD  and LCX arteries. Calcified plaque in ostial left main causes 0-24% stenosis LAD is a large vessel. Calcified plaque in proximal LAD causes 0-24% stenosis. LCX is a non-dominant artery that gives rise to one large OM1 branch. There is no plaque. Other findings: Left Ventricle: Normal size Left Atrium: Mild enlargement Pulmonary Veins: Normal configuration Right Ventricle: Normal size Right Atrium: Normal size.  Dual chamber pacemaker Thoracic aorta: Normal size Pulmonary Arteries: Normal size Systemic Veins: Normal drainage Pericardium: Moderate pericardial effusion, primarily located adjacent to the posterior/lateral walls of the LV IMPRESSION: 1. Technically difficult study due to multiple PVCs during acquisition. With ECG editing study is largely interpretable, though distal RCA remains uninterpretable 2. Coronary calcium score of 329. This was 67th percentile for age and sex matched control. 3. Normal coronary origin with right dominance. 4. Nonobstructive CAD 5. Calcified plaque in proximal RCA causes mild (25-49%) stenosis. 6. Calcified plaque causes minimal (0-24%) stenosis in ostial left main, ostial RCA, and proximal LAD 7. Moderate pericardial effusion, primarily located adjacent to the posterior/lateral walls of the LV CAD-RADS 2. Mild non-obstructive CAD (25-49%). Consider non-atherosclerotic causes of chest pain. Consider preventive therapy and risk factor modification. Electronically Signed   By: Oswaldo Milian M.D.   On: 01/27/2022 19:45   Result Date: 01/27/2022 EXAM: OVER-READ INTERPRETATION  CT CHEST The following report is a limited chest CT over-read performed by radiologist Dr. Rolm Baptise of [facility], [place] on 01/27/2022. This over-read does not include interpretation of cardiac or coronary anatomy or pathology. The coronary CTA interpretation by the cardiologist is attached. COMPARISON:  None Available. FINDINGS: Vascular: Mild cardiomegaly. Pacer wires in the right atrium and  right ventricle. Moderate aortic calcifications. No evidence of aortic aneurysm. Small pericardial effusion. Mediastinum/Nodes: No adenopathy Lungs/Pleura: Left basilar atelectasis or scarring.  No effusions. Upper Abdomen: No acute findings. Musculoskeletal: Chest wall soft tissues are unremarkable. No acute bony abnormality. IMPRESSION: Cardiomegaly.  Small pericardial effusion. Left lower lobe atelectasis or scarring. Aortic atherosclerosis. Electronically Signed: By: Rolm Baptise M.D. On: 01/27/2022 17:54   DG Chest 2 View  Result Date: 01/27/2022 CLINICAL DATA:  Weakness.  Chest pain. EXAM: CHEST - 2 VIEW COMPARISON:  11/07/2021 and older exams. FINDINGS: Cardiac silhouette is mildly enlarged. Stable left anterior chest wall dual lead pacemaker. No mediastinal or hilar masses. No convincing adenopathy. Lungs are hyperexpanded with prominent interstitial markings, otherwise clear. No pleural effusion or pneumothorax. No acute skeletal abnormality. IMPRESSION: No acute cardiopulmonary disease. Electronically Signed   By: Lajean Manes M.D.   On: 01/27/2022 09:21    Disposition   Pt is being discharged home today in good condition.  Follow-up Plans & Appointments  We started new medication colchicine 0.6 mg daily first dose 01/28/22 at [facility].    Heart Healthy diet  Keep follow up appts.   Follow-up Information     Martinique, [name], MD Follow up on 02/15/2022.   Specialty: Cardiology Why: at 10:20 am PLEASE  arrive 15 min early to check in if possible. Contact information: [address] [place] [address] [phone]         Evans Lance, MD Follow up on 02/20/2022.   Specialty: Cardiology Why: at 2:30 PM , please arrive 15 min early to check in if possible. Contact information: [address] [place] [address] [phone]  Discharge Medications   Allergies as of 01/28/2022       Reactions   Azithromycin Other  (See Comments)   Reaction not recalled   Penicillins Hives, Other (See Comments)   Bad reaction as a child after a shot of Penicillin   Pravastatin Other (See Comments)   Reaction not recalled   Sulfa Drugs Cross Reactors Other (See Comments)   Reaction not recalled- stated she can take it "in small doses"   Tape Itching, Other (See Comments)   Cannot wear for an extended period of time   Naproxen Sodium Rash        Medication List     STOP taking these medications    simvastatin 20 MG tablet Commonly known as: ZOCOR       TAKE these medications    acetaminophen 500 MG tablet Commonly known as: TYLENOL Take 500 mg by mouth every 6 (six) hours as needed for mild pain.   colchicine 0.6 MG tablet Take 1 tablet (0.6 mg total) by mouth daily.   docusate sodium 100 MG capsule Commonly known as: COLACE Take 100 mg by mouth at bedtime.   fluticasone 50 MCG/ACT nasal spray Commonly known as: FLONASE Place 1-2 sprays into both nostrils daily.   loperamide 2 MG tablet Commonly known as: IMODIUM A-D Take 1 tablet (2 mg total) by mouth as needed for diarrhea or loose stools.   Magnesium 200 MG Tabs Take 400 mg by mouth in the morning and at bedtime.   Multi Complete/Iron Tabs Take 1 tablet by mouth daily with breakfast. Folic acid included   PHENobarbital 32.4 MG tablet Commonly known as: LUMINAL Take 2 tablets (64.8 mg total) by mouth daily with breakfast.   phenytoin 100 MG ER capsule Commonly known as: DILANTIN Take 200 mg by mouth daily with breakfast.   Systane Ultra PF 0.4-0.3 % Soln Generic drug: Polyethyl Glyc-Propyl Glyc PF Place 1 drop into both eyes in the morning and at bedtime.   valsartan 160 MG tablet Commonly known as: Diovan Take 1 tablet (160 mg total) by mouth daily.   Vitamin-B Complex Tabs Take 1 tablet by mouth daily.           Outstanding Labs/Studies   Please draw ESR, C reactive Protein, at appt.   Echo was ordered to be done  prior to appt.  Duration of Discharge Encounter   Greater than 30 minutes including physician time.  Signed, Cecilie Kicks, NP 01/28/2022, 4:31 PM

## 2022-01-28 NOTE — Care Management CC44 (Signed)
Condition Code 44 Documentation Completed  Patient Details  Name: [name] MRN: [id number] Date of Birth: 1935/08/24   Condition Code 44 given:  Yes Patient signature on Condition Code 44 notice:  Yes Documentation of 2 MD's agreement:  Yes Code 44 added to claim:  Yes    Dawayne Patricia, RN 01/28/2022, 5:06 PM

## 2022-01-29 LAB — LIPOPROTEIN A (LPA): Lipoprotein (a): 330 nmol/L — ABNORMAL HIGH (ref <75.0)

## 2022-01-30 ENCOUNTER — Telehealth: Payer: Self-pay

## 2022-01-30 NOTE — Telephone Encounter (Signed)
error 

## 2022-02-06 ENCOUNTER — Other Ambulatory Visit: Payer: Self-pay | Admitting: Orthopedic Surgery

## 2022-02-06 DIAGNOSIS — G40209 Localization-related (focal) (partial) symptomatic epilepsy and epileptic syndromes with complex partial seizures, not intractable, without status epilepticus: Secondary | ICD-10-CM

## 2022-02-06 MED ORDER — PHENOBARBITAL 64.8 MG PO TABS: 64.8000 mg | ORAL_TABLET | Freq: Every morning | ORAL | 5 refills | Status: DC

## 2022-02-07 ENCOUNTER — Ambulatory Visit (INDEPENDENT_AMBULATORY_CARE_PROVIDER_SITE_OTHER): Payer: Medicare Other

## 2022-02-07 DIAGNOSIS — I442 Atrioventricular block, complete: Secondary | ICD-10-CM | POA: Diagnosis not present

## 2022-02-11 LAB — CUP PACEART REMOTE DEVICE CHECK
Battery Remaining Longevity: 149 mo
Battery Voltage: 3.2 V
Brady Statistic AP VP Percent: 4.29 %
Brady Statistic AP VS Percent: 0 %
Brady Statistic AS VP Percent: 94.85 %
Brady Statistic AS VS Percent: 0.57 %
Brady Statistic RA Percent Paced: 2.09 %
Brady Statistic RV Percent Paced: 98.24 %
Date Time Interrogation Session: 20230829225044
Implantable Lead Implant Date: 20230530
Implantable Lead Implant Date: 20230530
Implantable Lead Location: 753859
Implantable Lead Location: 753860
Implantable Lead Model: 3830
Implantable Lead Model: 5076
Implantable Pulse Generator Implant Date: 20230530
Lead Channel Impedance Value: 247 Ohm
Lead Channel Impedance Value: 323 Ohm
Lead Channel Impedance Value: 399 Ohm
Lead Channel Impedance Value: 456 Ohm
Lead Channel Pacing Threshold Amplitude: 0.875 V
Lead Channel Pacing Threshold Amplitude: 1 V
Lead Channel Pacing Threshold Pulse Width: 0.4 ms
Lead Channel Pacing Threshold Pulse Width: 0.4 ms
Lead Channel Sensing Intrinsic Amplitude: 0.875 mV
Lead Channel Sensing Intrinsic Amplitude: 0.875 mV
Lead Channel Sensing Intrinsic Amplitude: 15.375 mV
Lead Channel Sensing Intrinsic Amplitude: 15.375 mV
Lead Channel Setting Pacing Amplitude: 1.5 V
Lead Channel Setting Pacing Amplitude: 2 V
Lead Channel Setting Pacing Pulse Width: 0.4 ms
Lead Channel Setting Sensing Sensitivity: 1.2 mV

## 2022-02-13 ENCOUNTER — Ambulatory Visit (HOSPITAL_COMMUNITY): Payer: Medicare Other | Attending: Cardiology

## 2022-02-13 DIAGNOSIS — I3139 Other pericardial effusion (noninflammatory): Secondary | ICD-10-CM | POA: Diagnosis not present

## 2022-02-13 DIAGNOSIS — I300 Acute nonspecific idiopathic pericarditis: Secondary | ICD-10-CM | POA: Insufficient documentation

## 2022-02-13 LAB — ECHOCARDIOGRAM LIMITED
Area-P 1/2: 3.49 cm2
S' Lateral: 3.5 cm

## 2022-02-13 NOTE — Progress Notes (Deleted)
Office Visit    Patient Name: [name] Date of Encounter: 02/13/2022  Primary Care Provider:  Burnard Bunting, MD Primary Cardiologist:  Shama Monfils Martinique, MD  Chief Complaint    86 year old female with a history of recurrent syncope (Stokes-Adams attack), complete heart block s/p PPM, RBBB, paroxysmal atrial fibrillation, mitral valve prolapse, hypertension, hyperlipidemia, carotid artery disease, DDD, cerebral AV malformation, complex partial seizures, memory impairment, iron deficiency anemia, and osteopenia who presents for follow-up related to heart block, hypertension, and atrial fibrillation.  Past Medical History    Past Medical History:  Diagnosis Date   Abnormal cardiovascular stress test    Carotid arterial disease (HCC)    MODERATE RIGHT   Cerebral AV malformation    DDD (degenerative disc disease)    SEVERE WITH SPINAL STENOSIS   Hypercholesterolemia    Hypertension    MVP (mitral valve prolapse)    MILD   Numbness    LEFT FOOT AND ANKLE   Osteopenia    Pseudo-gout    Past Surgical History:  Procedure Laterality Date   APPENDECTOMY     BREAST MASS EXCISION     CARPAL TUNNEL RELEASE     PACEMAKER IMPLANT N/A 11/07/2021   Procedure: PACEMAKER IMPLANT;  Surgeon: Evans Lance, MD;  Location: [facility];  Service: Cardiovascular;  Laterality: N/A;   REMOVAL OF PARATHYROID GLAND     STRESS NUCLEAR STUDY     ABNORMAL   TONSILLECTOMY      Allergies  Allergies  Allergen Reactions   Azithromycin Other (See Comments)    Reaction not recalled   Penicillins Hives and Other (See Comments)    Bad reaction as a child after a shot of Penicillin   Pravastatin Other (See Comments)    Reaction not recalled   Sulfa Drugs Cross Reactors Other (See Comments)    Reaction not recalled- stated she can take it "in small doses"   Tape Itching and Other (See Comments)    Cannot wear for an extended period of time   Naproxen Sodium Rash    History of  Present Illness    86 year old female with the above past medical history including recurrent syncope (Stokes-Adams attack), complete heart block s/p PPM, RBBB, paroxysmal atrial fibrillation, mitral valve prolapse, hypertension, hyperlipidemia, carotid artery disease, DDD, cerebral AV malformation, complex partial seizures, memory impairment, iron deficiency anemia, and osteopenia.  She has a history of mildly abnormal Myoview study in 2010.  Not on ASA due to history of cerebral AV malformation.  She lives at [facility].  She history of recurrent syncope.  She was hospitalized in February 2022 with syncope, confusion, hyponatremia following diarrheal illness.  Carotid Dopplers in July 2022 showed 40 to 59% B ICA stenosis, stable from prior study.   She presented to the ED on 10/15/2021 with syncope.  Echocardiogram showed EF 45 to 50%, LVH, normal RV, moderately dilated left, mild MR, pericardial effusion.  Telemetry monitoring showed frequent PVCs.  Radiology and neurology were consulted.  EEG was negative for seizure activity, UA was negative for UTI.  It was mildly elevated with low suspicion for ACS.  She was hospitalized from 10/15/2021 to 10/17/2021.  She was discharged to assisted living with outpatient 14-day live [facility] patch revealed intermittent CHB, says lasting up to 10 seconds.  She was last in the office on 11/03/2021 by Dr. Lovena Le who recommended PPM for complete heart block.  She underwent PPM implantation on 11/07/2021 with Dr. Lovena Le.  She was discharged to skilled nursing facility on 11/07/2021.  The facility notified us of low-grade fevers over the weekend following her procedure, left arm swelling.  Her staff nurse, PPM was without signs of infection. PPM remote transmission on 11/14/2021 alerted for new onset atrial fibrillation. Per Dr. Lovena Le, she was to be referred to the atrial fibrillation clinic.  When seen in Afib clinic in July she was doing well. Not a candidate for  anticoagulation due to cerebral AVMs. Also not a candidate for Watchman device since she cannot take any antiplatelet or anticoagulants. She was recently admitted 8/19 with pleuritic chest pain. Ruled out for MI. Coronary CTA showed nonobstructive CAD. There was a moderate pericardial effusion noted and this was confirmed on Echo. Normal pacemaker parameters suggested no lead migration. She was treated for pericarditis with colchicine.   Home Medications    Current Outpatient Medications  Medication Sig Dispense Refill   acetaminophen (TYLENOL) 500 MG tablet Take 500 mg by mouth every 6 (six) hours as needed for mild pain.     B Complex Vitamins (VITAMIN-B COMPLEX) TABS Take 1 tablet by mouth daily.     colchicine 0.6 MG tablet Take 1 tablet (0.6 mg total) by mouth daily. 30 tablet 0   docusate sodium (COLACE) 100 MG capsule Take 100 mg by mouth at bedtime.     fluticasone (FLONASE) 50 MCG/ACT nasal spray Place 1-2 sprays into both nostrils daily.     loperamide (IMODIUM A-D) 2 MG tablet Take 1 tablet (2 mg total) by mouth as needed for diarrhea or loose stools. 30 tablet 3   Magnesium 200 MG TABS Take 400 mg by mouth in the morning and at bedtime.     Multiple Vitamins-Minerals (MULTI COMPLETE/IRON) TABS Take 1 tablet by mouth daily with breakfast. Folic acid included     PHENobarbital (LUMINAL) 64.8 MG tablet Take 1 tablet (64.8 mg total) by mouth in the morning. 30 tablet 5   phenytoin (DILANTIN) 100 MG ER capsule Take 200 mg by mouth daily with breakfast.     SYSTANE ULTRA PF 0.4-0.3 % SOLN Place 1 drop into both eyes in the morning and at bedtime.     valsartan (DIOVAN) 160 MG tablet Take 1 tablet (160 mg total) by mouth daily. 90 tablet 30   No current facility-administered medications for this visit.     Review of Systems    She denies chest pain, palpitations, dyspnea, pnd, orthopnea, n, v, dizziness, syncope, edema, weight gain, or early satiety. All other systems reviewed and are  otherwise negative except as noted above.   Physical Exam    VS:  There were no vitals taken for this visit.  GEN: Well nourished, well developed, in no acute distress. HEENT: normal. Neck: Supple, no JVD, bilateral carotid bruits, no masses. Cardiac: RRR, no murmurs, rubs, or gallops. No clubbing, cyanosis, edema.  Radials/DP/PT 2+ and equal bilaterally.  Respiratory:  Respirations regular and unlabored, clear to auscultation bilaterally. GI: Soft, nontender, nondistended, BS + x 4. MS: no deformity or atrophy. Skin: warm and dry, no rash. Neuro:  Strength and sensation are intact. Psych: Normal affect.  Accessory Clinical Findings    ECG personally reviewed by me today -atrial sensed, ventricular paced, 82 bpm- no acute changes.  Lab Results  Component Value Date   WBC 6.5 01/28/2022   HGB 8.4 (L) 01/28/2022   HCT 25.1 (L) 01/28/2022   MCV 98.8 01/28/2022   PLT 227 01/28/2022   Lab Results  Component  Value Date   CREATININE 1.08 (H) 01/28/2022   BUN 27 (H) 01/28/2022   NA 135 01/28/2022   K 3.9 01/28/2022   CL 102 01/28/2022   CO2 24 01/28/2022   Lab Results  Component Value Date   ALT 12 01/27/2022   AST 22 01/27/2022   ALKPHOS 72 01/27/2022   BILITOT 0.6 01/27/2022   Lab Results  Component Value Date   CHOL 208 (H) 01/28/2022   HDL 80 01/28/2022   LDLCALC 116 (H) 01/28/2022   TRIG 59 01/28/2022   CHOLHDL 2.6 01/28/2022    No results found for: "HGBA1C"   Coronary CTA 01/27/22   __Coronary Arteries:  Normal coronary origin.  Right dominance.   RCA is a large dominant artery that gives rise to PDA and PLA. Calcified plaque in ostial RCA causes 0-24% stenosis. Calcified plaque in proximal RCA causes 25-49% stenosis.   Left main is a large artery that gives rise to LAD and LCX arteries. Calcified plaque in ostial left main causes 0-24% stenosis   LAD is a large vessel. Calcified plaque in proximal LAD causes 0-24% stenosis.   LCX is a non-dominant  artery that gives rise to one large OM1 branch. There is no plaque.   Other findings:   Left Ventricle: Normal size   Left Atrium: Mild enlargement   Pulmonary Veins: Normal configuration   Right Ventricle: Normal size   Right Atrium: Normal size.  Dual chamber pacemaker   Thoracic aorta: Normal size   Pulmonary Arteries: Normal size   Systemic Veins: Normal drainage   Pericardium: Moderate pericardial effusion, primarily located adjacent to the posterior/lateral walls of the LV   IMPRESSION: 1. Technically difficult study due to multiple PVCs during acquisition. With ECG editing study is largely interpretable, though distal RCA remains uninterpretable   2. Coronary calcium score of 329. This was 67th percentile for age and sex matched control.   3. Normal coronary origin with right dominance.   4. Nonobstructive CAD   5. Calcified plaque in proximal RCA causes mild (25-49%) stenosis.   6. Calcified plaque causes minimal (0-24%) stenosis in ostial left main, ostial RCA, and proximal LAD   7. Moderate pericardial effusion, primarily located adjacent to the posterior/lateral walls of the LV   OVER Read by Radiologist      IMPRESSION: Cardiomegaly.  Small pericardial effusion.   Left lower lobe atelectasis or scarring.   Aortic atherosclerosis.     Echo 01/28/22 IMPRESSIONS     1. Left ventricular ejection fraction, by estimation, is 50 to 55%. The  left ventricle has low normal function. The left ventricle has no regional  wall motion abnormalities. Indeterminate diastolic filling due to E-A  fusion.   2. Right ventricular systolic function is normal. The right ventricular  size is normal. There is mildly elevated pulmonary artery systolic  pressure.   3. Left atrial size was mildly dilated.   4. Right atrial size was mildly dilated.   5. Moderate pericardial effusion. The pericardial effusion is  circumferential. There is no evidence of cardiac  tamponade.   6. The mitral valve is normal in structure. Mild mitral valve  regurgitation.   7. The aortic valve is grossly normal. Aortic valve regurgitation is not  visualized. No aortic stenosis is present.   8. The inferior vena cava is normal in size with greater than 50%  respiratory variability, suggesting right atrial pressure of 3 mmHg.   Comparison(s): Prior images reviewed side by side. The pericardial  effusion is larger.   FINDINGS   Left Ventricle: Left ventricular ejection fraction, by estimation, is 50  to 55%. The left ventricle has low normal function. The left ventricle has  no regional wall motion abnormalities. The left ventricular internal  cavity size was normal in size.  There is no left ventricular hypertrophy. Abnormal (paradoxical) septal  motion, consistent with RV pacemaker. Indeterminate diastolic filling due  to E-A fusion.   Right Ventricle: The right ventricular size is normal. No increase in  right ventricular wall thickness. Right ventricular systolic function is  normal. There is mildly elevated pulmonary artery systolic pressure. The  tricuspid regurgitant velocity is 3.04   m/s, and with an assumed right atrial pressure of 3 mmHg, the estimated  right ventricular systolic pressure is 61.4 mmHg.   Left Atrium: Left atrial size was mildly dilated.   Right Atrium: Right atrial size was mildly dilated.   Pericardium: A moderately sized pericardial effusion is present. The  pericardial effusion is circumferential. There is no evidence of cardiac  tamponade.   Mitral Valve: The mitral valve is normal in structure. Mild mitral valve  regurgitation. MV peak gradient, 15.2 mmHg. The mean mitral valve gradient  is 4.0 mmHg.   Tricuspid Valve: The tricuspid valve is normal in structure. Tricuspid  valve regurgitation is not demonstrated.   Aortic Valve: The aortic valve is grossly normal. Aortic valve  regurgitation is not visualized. No aortic  stenosis is present.   Pulmonic Valve: The pulmonic valve was normal in structure. Pulmonic valve  regurgitation is trivial.   Aorta: The aortic root is normal in size and structure.   Venous: The inferior vena cava is normal in size with greater than 50%  respiratory variability, suggesting right atrial pressure of 3 mmHg.   IAS/Shunts: No atrial level shunt detected by color flow Doppler.   ___________   Echo 02/13/22: IMPRESSIONS     1. Moderate pericardial effusion. Unchanged from prior study. Moderate  pericardial effusion. The pericardial effusion is circumferential. There  is no evidence of cardiac tamponade.   2. Left ventricular ejection fraction, by estimation, is 55 to 60%. The  left ventricle has normal function. The left ventricle has no regional  wall motion abnormalities.   3. Right ventricular systolic function is normal. The right ventricular  size is normal. Tricuspid regurgitation signal is inadequate for assessing  PA pressure.   4. The mitral valve is myxomatous. Mild mitral valve regurgitation.   5. The inferior vena cava is normal in size with greater than 50%  respiratory variability, suggesting right atrial pressure of 3 mmHg.   Comparison(s): No significant change from prior study. 01/28/22 EF 50-55%.  Moderate pericardial effusion.    Assessment & Plan    1. Recurrent syncope/CHB: S/p PPM 11/07/2021.  She denies any recurrent syncope.  PPM site clean, dry, intact without bruising, bleeding, or hematoma.  Steri-Strips intact.  She states she is scheduled for a wound check tomorrow.  Follow-up with Dr. Lovena Le as planned.  2. RBBB: EKG today shows atrial sensing, V pacing, 82 bpm. Stable.   3. Paroxysmal  atrial fibrillation: Recent care alert for AF > 0.5hr/24hrs. Presenting rhythm AF with Vs/Vp (controlled rates). More recent PPM interrogation not available for review in office today.  Asymptomatic. CHA2DS2-VASc Score = 5.  Discussed with Dr. Martinique,  primary cardiologist.  Unfortunately, she is not a candidate for DOAC at this time given history of cerebral AV malformation.  Per Dr. Tanna Furry recommendation, will refer  to A-fib clinic for further management.   4. Acute pericarditis: moderate pericardial effusion noted on CT and Echo.   5. Carotid artery disease: Carotid Dopplers in July 2022 showed 40 to 59% B ICA stenosis, stable from prior study.  Bilateral carotid bruits on exam.  Asymptomatic.  No indication for repeat study at this time.  6. Hypertension: BP elevated above goal in office today.  We will start valsartan 80 mg daily.  Recommend BMET in 2 weeks.  Continue to monitor BP and report BP consistently >130/80.   7. Hyperlipidemia: LDL was 118 in September 2022.  Monitored and managed per PCP.  Continue simvastatin.  8. Anemia: CBC was 9.9 in June 2023.  Monitored and managed per PCP.  9. Partial symptomatic epilepsy with complex partial seizures: Managed on Dilantin, and phenobarbital.  Stable.  10.  Disposition: Refer to atrial fibrillation clinic as above.  Follow-up as scheduled with Dr. Lovena Le and Dr. Martinique in September 2023 (3 months).    Ronetta Molla Martinique, MD,FACC 02/13/2022, 7:38 AM

## 2022-02-15 ENCOUNTER — Ambulatory Visit: Payer: Medicare Other | Admitting: Cardiology

## 2022-02-20 ENCOUNTER — Ambulatory Visit: Payer: Medicare Other | Attending: Internal Medicine | Admitting: Internal Medicine

## 2022-02-20 ENCOUNTER — Telehealth: Payer: Self-pay | Admitting: Internal Medicine

## 2022-02-20 VITALS — BP 160/56 | HR 94 | Ht 66.0 in | Wt 133.0 lb

## 2022-02-20 DIAGNOSIS — I459 Conduction disorder, unspecified: Secondary | ICD-10-CM

## 2022-02-20 DIAGNOSIS — Z950 Presence of cardiac pacemaker: Secondary | ICD-10-CM | POA: Diagnosis not present

## 2022-02-20 DIAGNOSIS — I10 Essential (primary) hypertension: Secondary | ICD-10-CM

## 2022-02-20 DIAGNOSIS — I6523 Occlusion and stenosis of bilateral carotid arteries: Secondary | ICD-10-CM

## 2022-02-20 MED ORDER — AMIODARONE HCL 200 MG PO TABS: 200.0000 mg | ORAL_TABLET | Freq: Every day | ORAL | 3 refills | Status: DC

## 2022-02-20 NOTE — Patient Instructions (Addendum)
Medication Instructions:  Your physician has recommended you make the following change in your medication:  Start Taking-  Amiodarone 200 Mg-   Take one tablet ( 200 Mg ) by mouth daily.     Lab Work: None ordered.  If you have labs (blood work) drawn today and your tests are completely normal, you will receive your results only by: [place] (if you have MyChart) OR A paper copy in the mail If you have any lab test that is abnormal or we need to change your treatment, we will call you to review the results.  Testing/Procedures: None ordered.  Follow-Up: At [facility], you and your health needs are our priority.  As part of our continuing mission to provide you with exceptional heart care, we have created designated Provider Care Teams.  These Care Teams include your primary Cardiologist (physician) and Advanced Practice Providers (APPs -  Physician Assistants and Nurse Practitioners) who all work together to provide you with the care you need, when you need it.  We recommend signing up for the patient portal called "MyChart".  Sign up information is provided on this After Visit Summary.  MyChart is used to connect with patients for Virtual Visits (Telemedicine).  Patients are able to view lab/test results, encounter notes, upcoming appointments, etc.  Non-urgent messages can be sent to your provider as well.   To learn more about what you can do with MyChart, go to [email or web address].    Your next appointment:   Please schedule a follow up appointment with Dr Lovena Le in 4 Months.   The format for your next appointment:   In Person  Provider:   Cristopher Peru, MD{or one of the following Advanced Practice Providers on your designated Care Team:   Tommye Standard, [place] Legrand Como "Jonni Sanger" Chalmers Cater, [place]  Remote monitoring is used to monitor your Pacemaker from home. This monitoring reduces the number of office visits required to check your device to one time per year. It allows [place]  to keep an eye on the functioning of your device to ensure it is working properly. You are scheduled for a device check from home on 05/09/22. You may send your transmission at any time that day. If you have a wireless device, the transmission will be sent automatically. After your physician reviews your transmission, you will receive a postcard with your next transmission date.  Important Information About Sugar      Amiodarone Tablets What is this medication? AMIODARONE (a MEE oh da rone) prevents and treats a fast or irregular heartbeat (arrhythmia). It works by slowing down overactive electric signals in the heart, which stabilizes your heart rhythm. It belongs to a group of medications called antiarrhythmics. This medicine may be used for other purposes; ask your health care provider or pharmacist if you have questions. COMMON BRAND NAME(S): Cordarone, Pacerone What should I tell my care team before I take this medication? They need to know if you have any of these conditions: Liver disease Lung disease Other heart problems Thyroid disease An unusual or allergic reaction to amiodarone, iodine, other medications, foods, dyes, or preservatives Pregnant or trying to get pregnant Breast-feeding How should I use this medication? Take this medication by mouth with a glass of water. Follow the directions on the prescription label. You can take this medication with or without food. However, you should always take it the same way each time. Take your doses at regular intervals. Do not take your medication more often than directed. Do not  stop taking except on the advice of your care team. A special MedGuide will be given to you by the pharmacist with each prescription and refill. Be sure to read this information carefully each time. Talk to your care team regarding the use of this medication in children. Special care may be needed. Overdosage: If you think you have taken too much of this  medicine contact a poison control center or emergency room at once. NOTE: This medicine is only for you. Do not share this medicine with others. What if I miss a dose? If you miss a dose, take it as soon as you can. If it is almost time for your next dose, take only that dose. Do not take double or extra doses. What may interact with this medication? Do not take this medication with any of the following: Abarelix Apomorphine Arsenic trioxide Certain antibiotics like erythromycin, gemifloxacin, levofloxacin, pentamidine Certain medications for depression like amoxapine, tricyclic antidepressants Certain medications for fungal infections like fluconazole, itraconazole, ketoconazole, posaconazole, voriconazole Certain medications for irregular heartbeat like disopyramide, dronedarone, ibutilide, propafenone, sotalol Certain medications for malaria like chloroquine, halofantrine Cisapride Droperidol Haloperidol [place] Maprotiline Methadone Phenothiazines like chlorpromazine, mesoridazine, thioridazine Pimozide Ranolazine Red yeast rice Vardenafil This medication may also interact with the following: Antiviral medications for HIV or AIDS Certain medications for blood pressure, heart disease, irregular heart beat Certain medications for cholesterol like atorvastatin, cerivastatin, lovastatin, simvastatin Certain medications for hepatitis C like sofosbuvir and ledipasvir; sofosbuvir Certain medications for seizures like phenytoin Certain medications for thyroid problems Certain medications that treat or prevent blood clots like warfarin Cholestyramine Cimetidine Clopidogrel Cyclosporine Dextromethorphan Diuretics Dofetilide Fentanyl General anesthetics Grapefruit juice Lidocaine Loratadine Methotrexate Other medications that prolong the QT interval (cause an abnormal heart rhythm) Procainamide Quinidine Rifabutin, rifampin, or rifapentine [facility] John's  Wort Trazodone Ziprasidone This list may not describe all possible interactions. Give your health care provider a list of all the medicines, herbs, non-prescription drugs, or dietary supplements you use. Also tell them if you smoke, drink alcohol, or use illegal drugs. Some items may interact with your medicine. What should I watch for while using this medication? Your condition will be monitored closely when you first begin therapy. Often, this medication is first started in a hospital or other monitored health care setting. Once you are on maintenance therapy, visit your care team for regular checks on your progress. Because your condition and use of this medication carry some risk, it is a good idea to carry an identification card, necklace or bracelet with details of your condition, medications, and care team. You may get drowsy or dizzy. Do not drive, use machinery, or do anything that needs mental alertness until you know how this medication affects you. Do not stand or sit up quickly, especially if you are an older patient. This reduces the risk of dizzy or fainting spells. This medication can make you more sensitive to the sun. Keep out of the sun. If you cannot avoid being in the sun, wear protective clothing and use sunscreen. Do not use sun lamps or tanning beds/booths. You should have regular eye exams before and during treatment. Call your care team if you have blurred vision, see halos, or your eyes become sensitive to light. Your eyes may get dry. It may be helpful to use a lubricating eye solution or artificial tears solution. If you are going to have surgery or a procedure that requires contrast dyes, tell your care team that you are taking this medication. What  side effects may I notice from receiving this medication? Side effects that you should report to your care team as soon as possible: Allergic reactions--skin rash, itching, hives, swelling of the face, lips, tongue, or  throat Bluish-gray skin Change in vision such as blurry vision, seeing halos around lights, vision loss Heart failure--shortness of breath, swelling of the ankles, feet, or hands, sudden weight gain, unusual weakness or fatigue Heart rhythm changes--fast or irregular heartbeat, dizziness, feeling faint or lightheaded, chest pain, trouble breathing High thyroid levels (hyperthyroidism)--fast or irregular heartbeat, weight loss, excessive sweating or sensitivity to heat, tremors or shaking, anxiety, nervousness, irregular menstrual cycle or spotting Liver injury--right upper belly pain, loss of appetite, nausea, light-colored stool, dark yellow or brown urine, yellowing skin or eyes, unusual weakness or fatigue Low thyroid levels (hypothyroidism)--unusual weakness or fatigue, sensitivity to cold, constipation, hair loss, dry skin, weight gain, feelings of depression Lung injury--shortness of breath or trouble breathing, cough, spitting up blood, chest pain, fever Pain, tingling, or numbness in the hands or feet, muscle weakness, trouble walking, loss of balance or coordination Side effects that usually do not require medical attention (report to your care team if they continue or are bothersome): Nausea Vomiting This list may not describe all possible side effects. Call your doctor for medical advice about side effects. You may report side effects to FDA at 1-800-FDA-1088. Where should I keep my medication? Keep out of the reach of children and pets. Store at room temperature between 20 and 25 degrees C (68 and 77 degrees F). Protect from light. Keep container tightly closed. Throw away any unused medication after the expiration date. NOTE: This sheet is a summary. It may not cover all possible information. If you have questions about this medicine, talk to your doctor, pharmacist, or health care provider.  2023 Elsevier/Gold Standard (2007-07-19 00:00:00)

## 2022-02-20 NOTE — Telephone Encounter (Signed)
[name] from [facility] returning call. Requesting call back. She states she just needs orders faxed.

## 2022-02-20 NOTE — Telephone Encounter (Signed)
Newe Message:    Tomi Bamberger from [facility] called. She said she need an order for the medicine that was called in to the pharmacy today please.  e

## 2022-02-20 NOTE — Progress Notes (Signed)
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 She is a pleasant 86 yo woman with a h/o HTN, who has had recurrent syncope. She has worn a cardiac monitor demonstrating CHB with long pauses of over 10 seconds. She has RBBB. She underwent PPM insertion and has developed atrial fib. She is not a candidate for systemic anti-coagulation. She has minimal palpitations.  Allergies  Allergen Reactions   Azithromycin Other (See Comments)    Reaction not recalled   Penicillins Hives and Other (See Comments)    Bad reaction as a child after a shot of Penicillin   Pravastatin Other (See Comments)    Reaction not recalled   Sulfa Drugs Cross Reactors Other (See Comments)    Reaction not recalled- stated she can take it "in small doses"   Tape Itching and Other (See Comments)    Cannot wear for an extended period of time   Naproxen Sodium Rash     Current Outpatient Medications  Medication Sig Dispense Refill   acetaminophen (TYLENOL) 500 MG tablet Take 500 mg by mouth every 6 (six) hours as needed for mild pain.     B Complex Vitamins (VITAMIN-B COMPLEX) TABS Take 1 tablet by mouth daily.     colchicine 0.6 MG tablet Take 1 tablet (0.6 mg total) by mouth daily. 30 tablet 0   docusate sodium (COLACE) 100 MG capsule Take 100 mg by mouth at bedtime.     fluticasone (FLONASE) 50 MCG/ACT nasal spray Place 1-2 sprays into both nostrils daily.     loperamide (IMODIUM A-D) 2 MG tablet Take 1 tablet (2 mg total) by mouth as needed for diarrhea or loose stools. 30 tablet 3   Magnesium 200 MG TABS Take 400 mg by mouth in the morning and at bedtime.     Multiple Vitamins-Minerals (MULTI COMPLETE/IRON) TABS Take 1 tablet by mouth daily with breakfast. Folic acid included     PHENobarbital (LUMINAL) 64.8 MG tablet Take 1 tablet (64.8 mg total) by mouth in the morning. 30 tablet 5   phenytoin (DILANTIN) 100 MG ER capsule Take 200 mg by mouth daily with breakfast.      SYSTANE ULTRA PF 0.4-0.3 % SOLN Place 1 drop into both eyes in the morning and at bedtime.     valsartan (DIOVAN) 160 MG tablet Take 1 tablet (160 mg total) by mouth daily. 90 tablet 30   No current facility-administered medications for this visit.     Past Medical History:  Diagnosis Date   Abnormal cardiovascular stress test    Carotid arterial disease (HCC)    MODERATE RIGHT   Cerebral AV malformation    DDD (degenerative disc disease)    SEVERE WITH SPINAL STENOSIS   Hypercholesterolemia    Hypertension    MVP (mitral valve prolapse)    MILD   Numbness    LEFT FOOT AND ANKLE   Osteopenia    Pseudo-gout     ROS:   All systems reviewed and negative except as noted in the HPI.   Past Surgical History:  Procedure Laterality Date   APPENDECTOMY     BREAST MASS EXCISION     CARPAL TUNNEL RELEASE     PACEMAKER IMPLANT N/A 11/07/2021   Procedure: PACEMAKER IMPLANT;  Surgeon: Evans Lance, MD;  Location: [facility];  Service: Cardiovascular;  Laterality: N/A;   REMOVAL OF PARATHYROID GLAND     STRESS NUCLEAR STUDY  ABNORMAL   TONSILLECTOMY       Family History  Problem Relation Age of Onset   Stroke Mother    Lung cancer Father    Breast cancer Neg Hx      Social History   Socioeconomic History   Marital status: Widowed    Spouse name: Not on file   Number of children: 3   Years of education: Not on file   Highest education level: Not on file  Occupational History   Not on file  Tobacco Use   Smoking status: Former    Packs/day: 1.00    Years: 14.00    Total pack years: 14.00    Types: Cigarettes    Quit date: 11/07/1968    Years since quitting: 53.3   Smokeless tobacco: Never  Vaping Use   Vaping Use: Never used  Substance and Sexual Activity   Alcohol use: No   Drug use: No   Sexual activity: Not on file  Other Topics Concern   Not on file  Social History Narrative   [place] home [place] Strain: Not on file  Food Insecurity: Not on file  Transportation Needs: Not on file  Physical Activity: Not on file  Stress: Not on file  Social Connections: Not on file  Intimate Partner Violence: Not on file     BP (!) 160/56   Pulse 94   Ht '5\' 6"'$  (1.676 m)   Wt 133 lb (60.3 kg)   BMI 21.47 kg/m   Physical Exam:  Well appearing NAD HEENT: Unremarkable Neck:  No JVD, no thyromegally Lymphatics:  No adenopathy Back:  No CVA tenderness Lungs:  Clear with no wheezes HEART:  Regular rate rhythm, no murmurs, no rubs, no clicks Abd:  soft, positive bowel sounds, no organomegally, no rebound, no guarding Ext:  2 plus pulses, no edema, no cyanosis, no clubbing Skin:  No rashes no nodules Neuro:  CN II through XII intact, motor grossly intact  EKG - nsr with ventricular pacing  DEVICE  Normal device function.  See PaceArt for details.   Assess/Plan:   Stokes Adams syncope - She has undergone DDD PM insertion and has not had any additional heart block. HTN - her SBP is up but diastolic bp is low.   PAF - as she is at increased stroke risk and not able to take systemic anti-coagulation, I have recommended we try amiodarone 200 mg daily.  Carotid vascular disease - she does not appear to be symptomatic. We will follow.  Royston Sinner Bentzion Dauria,MD

## 2022-02-21 NOTE — Telephone Encounter (Signed)
[name] called back at [facility].  Copied / faxed patient information for new start of Amiodarone per Dr. Lovena Le from 02/20/22 visit.    Verified next appointment with [name] at [facility].    No follow up required.

## 2022-02-27 ENCOUNTER — Telehealth: Payer: Self-pay

## 2022-02-27 DIAGNOSIS — I459 Conduction disorder, unspecified: Secondary | ICD-10-CM

## 2022-02-27 NOTE — Telephone Encounter (Signed)
-----   Message from Basheba J Martinique sent at 02/23/2022  4:08 PM EDT ----- Regarding: EKG order Need EKG order put in so I can scan EKG into pts file

## 2022-02-27 NOTE — Telephone Encounter (Signed)
EKG ordered for Ms. [name] for pt medical record.

## 2022-02-28 NOTE — Progress Notes (Signed)
Office Visit    Patient Name: [name] Date of Encounter: 03/05/2022  Primary Care Provider:  Burnard Bunting, MD Primary Cardiologist:  Yaniah Thiemann [place], MD  Chief Complaint    86 year old female with a history of recurrent syncope (Stokes-Adams attack), complete heart block s/p PPM, RBBB, paroxysmal atrial fibrillation, mitral valve prolapse, hypertension, hyperlipidemia, carotid artery disease, DDD, cerebral AV malformation, complex partial seizures, memory impairment, iron deficiency anemia, and osteopenia who presents for follow-up related to heart block, hypertension, and atrial fibrillation.  Past Medical History    Past Medical History:  Diagnosis Date   Abnormal cardiovascular stress test    Carotid arterial disease (HCC)    MODERATE RIGHT   Cerebral AV malformation    DDD (degenerative disc disease)    SEVERE WITH SPINAL STENOSIS   Hypercholesterolemia    Hypertension    MVP (mitral valve prolapse)    MILD   Numbness    LEFT FOOT AND ANKLE   Osteopenia    Pseudo-gout    Past Surgical History:  Procedure Laterality Date   APPENDECTOMY     BREAST MASS EXCISION     CARPAL TUNNEL RELEASE     PACEMAKER IMPLANT N/A 11/07/2021   Procedure: PACEMAKER IMPLANT;  Surgeon: Evans Lance, MD;  Location: [facility];  Service: Cardiovascular;  Laterality: N/A;   REMOVAL OF PARATHYROID GLAND     STRESS NUCLEAR STUDY     ABNORMAL   TONSILLECTOMY      Allergies  Allergies  Allergen Reactions   Azithromycin Other (See Comments)    Reaction not recalled   Penicillins Hives and Other (See Comments)    Bad reaction as a child after a shot of Penicillin   Pravastatin Other (See Comments)    Reaction not recalled   Sulfa Drugs Cross Reactors Other (See Comments)    Reaction not recalled- stated she can take it "in small doses"   Tape Itching and Other (See Comments)    Cannot wear for an extended period of time   Naproxen Sodium Rash    History of  Present Illness    86 year old female with the above past medical history including recurrent syncope (Stokes-Adams attack), complete heart block s/p PPM, RBBB, paroxysmal atrial fibrillation, mitral valve prolapse, hypertension, hyperlipidemia, carotid artery disease, DDD, cerebral AV malformation, complex partial seizures, memory impairment, iron deficiency anemia, and osteopenia.  She has a history of mildly abnormal Myoview study in 2010.  Not on ASA due to history of cerebral AV malformation.  She lives at [facility].  She history of recurrent syncope.  She was hospitalized in February 2022 with syncope, confusion, hyponatremia following diarrheal illness.  Carotid Dopplers in July 2022 showed 40 to 59% B ICA stenosis, stable from prior study.   She presented to the ED on 10/15/2021 with syncope.  Echocardiogram showed EF 45 to 50%, LVH, normal RV, moderately dilated left, mild MR, pericardial effusion.  Telemetry monitoring showed frequent PVCs.  Radiology and neurology were consulted.  EEG was negative for seizure activity, UA was negative for UTI.  It was mildly elevated with low suspicion for ACS.  She was hospitalized from 10/15/2021 to 10/17/2021.  She was discharged to assisted living with outpatient 14-day live [facility] patch revealed intermittent CHB, says lasting up to 10 seconds.  She was last in the office on 11/03/2021 by Dr. Lovena Le who recommended PPM for complete heart block.  She underwent PPM implantation on 11/07/2021 with Dr. Lovena Le.  She was discharged to skilled nursing facility on 11/07/2021.  The facility notified us of low-grade fevers over the weekend following her procedure, left arm swelling.  Her staff nurse, PPM was without signs of infection. PPM remote transmission on 11/14/2021 alerted for new onset atrial fibrillation. Per Dr. Lovena Le, she was to be referred to the atrial fibrillation clinic.  When seen in Afib clinic in July she was doing well. Not a candidate for  anticoagulation due to cerebral AVMs. Also not a candidate for Watchman device since she cannot take any antiplatelet or anticoagulants. She was recently admitted 8/19 with pleuritic chest pain. Ruled out for MI. Coronary CTA showed nonobstructive CAD. There was a moderate pericardial effusion noted and this was confirmed on Echo. Normal pacemaker parameters suggested no lead migration. She was treated for pericarditis with colchicine.   She was seen by Dr Lovena Le EPS in September who recommended a trial of amiodarone for her Afib. Since then she has noted persistent nausea, diarrhea and poor appetite. Was placed on Flagyl and states diarrhea is better but still doesn't feel. Well.   Home Medications    Current Outpatient Medications  Medication Sig Dispense Refill   acetaminophen (TYLENOL) 500 MG tablet Take 500 mg by mouth every 6 (six) hours as needed for mild pain.     B Complex Vitamins (VITAMIN-B COMPLEX) TABS Take 1 tablet by mouth daily.     colchicine 0.6 MG tablet Take 1 tablet (0.6 mg total) by mouth daily. 30 tablet 0   docusate sodium (COLACE) 100 MG capsule Take 100 mg by mouth at bedtime.     fluticasone (FLONASE) 50 MCG/ACT nasal spray Place 1-2 sprays into both nostrils daily.     loperamide (IMODIUM A-D) 2 MG tablet Take 1 tablet (2 mg total) by mouth as needed for diarrhea or loose stools. 30 tablet 3   metroNIDAZOLE (FLAGYL) 500 MG tablet Take 500 mg by mouth 3 (three) times daily.     Multiple Vitamins-Minerals (MULTI COMPLETE/IRON) TABS Take 1 tablet by mouth daily with breakfast. Folic acid included     ondansetron (ZOFRAN-ODT) 4 MG disintegrating tablet Take by mouth.     PHENobarbital (LUMINAL) 64.8 MG tablet Take 1 tablet (64.8 mg total) by mouth in the morning. 30 tablet 5   phenytoin (DILANTIN) 100 MG ER capsule Take 200 mg by mouth daily with breakfast.     SYSTANE ULTRA PF 0.4-0.3 % SOLN Place 1 drop into both eyes in the morning and at bedtime.     valsartan (DIOVAN)  160 MG tablet Take 1 tablet (160 mg total) by mouth daily. 90 tablet 30   amiodarone (PACERONE) 200 MG tablet Take 1 tablet (200 mg total) by mouth daily. (Patient not taking: Reported on 03/05/2022) 90 tablet 3   Magnesium 200 MG TABS Take 400 mg by mouth in the morning and at bedtime. (Patient not taking: Reported on 03/05/2022)     No current facility-administered medications for this visit.     Review of Systems    She denies chest pain, palpitations, dyspnea, pnd, orthopnea, n, v, dizziness, syncope, edema, weight gain, or early satiety. All other systems reviewed and are otherwise negative except as noted above.   Physical Exam    VS:  BP 130/70   Pulse 94   Ht '5\' 6"'$  (1.676 m)   Wt 132 lb 9.6 oz (60.1 kg)   SpO2 98%   BMI 21.40 kg/m   GEN: Well nourished, well developed, in no acute  distress. HEENT: normal. Neck: Supple, no JVD, bilateral carotid bruits, no masses. Cardiac: RRR, no murmurs, rubs, or gallops. No clubbing, cyanosis, edema.  Radials/DP/PT 2+ and equal bilaterally.  Respiratory:  Respirations regular and unlabored, clear to auscultation bilaterally. GI: Soft, nontender, nondistended, BS + x 4. MS: no deformity or atrophy. Skin: warm and dry, no rash. Neuro:  Strength and sensation are intact. Psych: Normal affect.  Accessory Clinical Findings    ECG is not done today.  Lab Results  Component Value Date   WBC 6.5 01/28/2022   HGB 8.4 (L) 01/28/2022   HCT 25.1 (L) 01/28/2022   MCV 98.8 01/28/2022   PLT 227 01/28/2022   Lab Results  Component Value Date   CREATININE 1.08 (H) 01/28/2022   BUN 27 (H) 01/28/2022   NA 135 01/28/2022   K 3.9 01/28/2022   CL 102 01/28/2022   CO2 24 01/28/2022   Lab Results  Component Value Date   ALT 12 01/27/2022   AST 22 01/27/2022   ALKPHOS 72 01/27/2022   BILITOT 0.6 01/27/2022   Lab Results  Component Value Date   CHOL 208 (H) 01/28/2022   HDL 80 01/28/2022   LDLCALC 116 (H) 01/28/2022   TRIG 59 01/28/2022    CHOLHDL 2.6 01/28/2022    No results found for: "HGBA1C"   Coronary CTA 01/27/22   __Coronary Arteries:  Normal coronary origin.  Right dominance.   RCA is a large dominant artery that gives rise to PDA and PLA. Calcified plaque in ostial RCA causes 0-24% stenosis. Calcified plaque in proximal RCA causes 25-49% stenosis.   Left main is a large artery that gives rise to LAD and LCX arteries. Calcified plaque in ostial left main causes 0-24% stenosis   LAD is a large vessel. Calcified plaque in proximal LAD causes 0-24% stenosis.   LCX is a non-dominant artery that gives rise to one large OM1 branch. There is no plaque.   Other findings:   Left Ventricle: Normal size   Left Atrium: Mild enlargement   Pulmonary Veins: Normal configuration   Right Ventricle: Normal size   Right Atrium: Normal size.  Dual chamber pacemaker   Thoracic aorta: Normal size   Pulmonary Arteries: Normal size   Systemic Veins: Normal drainage   Pericardium: Moderate pericardial effusion, primarily located adjacent to the posterior/lateral walls of the LV   IMPRESSION: 1. Technically difficult study due to multiple PVCs during acquisition. With ECG editing study is largely interpretable, though distal RCA remains uninterpretable   2. Coronary calcium score of 329. This was 67th percentile for age and sex matched control.   3. Normal coronary origin with right dominance.   4. Nonobstructive CAD   5. Calcified plaque in proximal RCA causes mild (25-49%) stenosis.   6. Calcified plaque causes minimal (0-24%) stenosis in ostial left main, ostial RCA, and proximal LAD   7. Moderate pericardial effusion, primarily located adjacent to the posterior/lateral walls of the LV   OVER Read by Radiologist      IMPRESSION: Cardiomegaly.  Small pericardial effusion.   Left lower lobe atelectasis or scarring.   Aortic atherosclerosis.     Echo 01/28/22 IMPRESSIONS     1. Left  ventricular ejection fraction, by estimation, is 50 to 55%. The  left ventricle has low normal function. The left ventricle has no regional  wall motion abnormalities. Indeterminate diastolic filling due to E-A  fusion.   2. Right ventricular systolic function is normal. The right ventricular  size  is normal. There is mildly elevated pulmonary artery systolic  pressure.   3. Left atrial size was mildly dilated.   4. Right atrial size was mildly dilated.   5. Moderate pericardial effusion. The pericardial effusion is  circumferential. There is no evidence of cardiac tamponade.   6. The mitral valve is normal in structure. Mild mitral valve  regurgitation.   7. The aortic valve is grossly normal. Aortic valve regurgitation is not  visualized. No aortic stenosis is present.   8. The inferior vena cava is normal in size with greater than 50%  respiratory variability, suggesting right atrial pressure of 3 mmHg.   Comparison(s): Prior images reviewed side by side. The pericardial  effusion is larger.   FINDINGS   Left Ventricle: Left ventricular ejection fraction, by estimation, is 50  to 55%. The left ventricle has low normal function. The left ventricle has  no regional wall motion abnormalities. The left ventricular internal  cavity size was normal in size.  There is no left ventricular hypertrophy. Abnormal (paradoxical) septal  motion, consistent with RV pacemaker. Indeterminate diastolic filling due  to E-A fusion.   Right Ventricle: The right ventricular size is normal. No increase in  right ventricular wall thickness. Right ventricular systolic function is  normal. There is mildly elevated pulmonary artery systolic pressure. The  tricuspid regurgitant velocity is 3.04   m/s, and with an assumed right atrial pressure of 3 mmHg, the estimated  right ventricular systolic pressure is 56.4 mmHg.   Left Atrium: Left atrial size was mildly dilated.   Right Atrium: Right atrial size  was mildly dilated.   Pericardium: A moderately sized pericardial effusion is present. The  pericardial effusion is circumferential. There is no evidence of cardiac  tamponade.   Mitral Valve: The mitral valve is normal in structure. Mild mitral valve  regurgitation. MV peak gradient, 15.2 mmHg. The mean mitral valve gradient  is 4.0 mmHg.   Tricuspid Valve: The tricuspid valve is normal in structure. Tricuspid  valve regurgitation is not demonstrated.   Aortic Valve: The aortic valve is grossly normal. Aortic valve  regurgitation is not visualized. No aortic stenosis is present.   Pulmonic Valve: The pulmonic valve was normal in structure. Pulmonic valve  regurgitation is trivial.   Aorta: The aortic root is normal in size and structure.   Venous: The inferior vena cava is normal in size with greater than 50%  respiratory variability, suggesting right atrial pressure of 3 mmHg.   IAS/Shunts: No atrial level shunt detected by color flow Doppler.   ___________   Echo 02/13/22: IMPRESSIONS     1. Moderate pericardial effusion. Unchanged from prior study. Moderate  pericardial effusion. The pericardial effusion is circumferential. There  is no evidence of cardiac tamponade.   2. Left ventricular ejection fraction, by estimation, is 55 to 60%. The  left ventricle has normal function. The left ventricle has no regional  wall motion abnormalities.   3. Right ventricular systolic function is normal. The right ventricular  size is normal. Tricuspid regurgitation signal is inadequate for assessing  PA pressure.   4. The mitral valve is myxomatous. Mild mitral valve regurgitation.   5. The inferior vena cava is normal in size with greater than 50%  respiratory variability, suggesting right atrial pressure of 3 mmHg.   Comparison(s): No significant change from prior study. 01/28/22 EF 50-55%.  Moderate pericardial effusion.    Assessment & Plan    1. Recurrent syncope/CHB: S/p PPM  11/07/2021.  She denies any recurrent syncope.  Follow up with EP  2.  Paroxysmal  atrial fibrillation: patient's last device check demonstrated an Afib burden of about 60%. Rate controlled with Afib. She is completely asymptomatic. Was placed on amiodarone about 12 days ago which correlates with onset of her GI symptoms. Will stop amiodarone. Continue with rate control strategy. Unfortunately, she is not a candidate for DOAC at this time given history of cerebral AV malformation.  Also not a candidate for Watchman device.   3. Acute pericarditis: moderate pericardial effusion noted on CT and Echo. Repeat Echo on September 5 showed no change in moderate effusion. No evidence of tamponade. Will plan on repeating Echo in 6 months unless there is a change in clinical status. She is having no chest pain now.   5. Carotid artery disease: Carotid Dopplers in July 2022 showed 40 to 59% B ICA stenosis, stable from prior study.  Bilateral carotid bruits on exam.  Asymptomatic.  No indication for repeat study at this time.  6. Hypertension: BP is now well controlled.   7. Hyperlipidemia: LDL was 116 Continue simvastatin.  8. Anemia: CBC was 9.9 in June 2023.  Monitored and managed per PCP.  9. Partial symptomatic epilepsy with complex partial seizures: Managed on Dilantin, and phenobarbital.  Stable.  10.  Disposition: follow up in 3 months   Orianna Biskup [place], [place] 03/05/2022, 9:53 AM

## 2022-03-01 NOTE — Addendum Note (Signed)
Addended by: Oleta Mouse C on: 03/01/2022 02:42 PM   Modules accepted: Orders

## 2022-03-02 NOTE — Progress Notes (Signed)
Remote pacemaker transmission.   

## 2022-03-05 ENCOUNTER — Encounter: Payer: Self-pay | Admitting: Cardiology

## 2022-03-05 ENCOUNTER — Ambulatory Visit: Payer: Medicare Other | Attending: Cardiology | Admitting: Cardiology

## 2022-03-05 VITALS — BP 130/70 | HR 94 | Ht 66.0 in | Wt 132.6 lb

## 2022-03-05 DIAGNOSIS — Z950 Presence of cardiac pacemaker: Secondary | ICD-10-CM | POA: Insufficient documentation

## 2022-03-05 DIAGNOSIS — I300 Acute nonspecific idiopathic pericarditis: Secondary | ICD-10-CM | POA: Diagnosis not present

## 2022-03-05 DIAGNOSIS — I6523 Occlusion and stenosis of bilateral carotid arteries: Secondary | ICD-10-CM

## 2022-03-05 DIAGNOSIS — I4510 Unspecified right bundle-branch block: Secondary | ICD-10-CM | POA: Insufficient documentation

## 2022-03-05 DIAGNOSIS — I442 Atrioventricular block, complete: Secondary | ICD-10-CM | POA: Diagnosis not present

## 2022-03-05 DIAGNOSIS — I480 Paroxysmal atrial fibrillation: Secondary | ICD-10-CM | POA: Diagnosis not present

## 2022-03-05 NOTE — Patient Instructions (Addendum)
Stop taking amiodarone.  Follow up appointment in 3 months

## 2022-03-12 DIAGNOSIS — R7989 Other specified abnormal findings of blood chemistry: Secondary | ICD-10-CM | POA: Diagnosis not present

## 2022-03-12 DIAGNOSIS — E785 Hyperlipidemia, unspecified: Secondary | ICD-10-CM | POA: Diagnosis not present

## 2022-03-12 DIAGNOSIS — M8580 Other specified disorders of bone density and structure, unspecified site: Secondary | ICD-10-CM | POA: Diagnosis not present

## 2022-03-12 DIAGNOSIS — E042 Nontoxic multinodular goiter: Secondary | ICD-10-CM | POA: Diagnosis not present

## 2022-03-12 DIAGNOSIS — I10 Essential (primary) hypertension: Secondary | ICD-10-CM | POA: Diagnosis not present

## 2022-03-12 DIAGNOSIS — G40909 Epilepsy, unspecified, not intractable, without status epilepticus: Secondary | ICD-10-CM | POA: Diagnosis not present

## 2022-03-19 DIAGNOSIS — Z1331 Encounter for screening for depression: Secondary | ICD-10-CM | POA: Diagnosis not present

## 2022-03-19 DIAGNOSIS — Z1339 Encounter for screening examination for other mental health and behavioral disorders: Secondary | ICD-10-CM | POA: Diagnosis not present

## 2022-03-19 DIAGNOSIS — R82998 Other abnormal findings in urine: Secondary | ICD-10-CM | POA: Diagnosis not present

## 2022-03-19 DIAGNOSIS — G40909 Epilepsy, unspecified, not intractable, without status epilepticus: Secondary | ICD-10-CM | POA: Diagnosis not present

## 2022-03-19 DIAGNOSIS — E871 Hypo-osmolality and hyponatremia: Secondary | ICD-10-CM | POA: Diagnosis not present

## 2022-03-19 DIAGNOSIS — M4800 Spinal stenosis, site unspecified: Secondary | ICD-10-CM | POA: Diagnosis not present

## 2022-03-19 DIAGNOSIS — R531 Weakness: Secondary | ICD-10-CM | POA: Diagnosis not present

## 2022-03-19 DIAGNOSIS — I739 Peripheral vascular disease, unspecified: Secondary | ICD-10-CM | POA: Diagnosis not present

## 2022-03-19 DIAGNOSIS — I10 Essential (primary) hypertension: Secondary | ICD-10-CM | POA: Diagnosis not present

## 2022-03-19 DIAGNOSIS — Z9181 History of falling: Secondary | ICD-10-CM | POA: Diagnosis not present

## 2022-03-19 DIAGNOSIS — Z0000 Encounter for general adult medical examination without abnormal findings: Secondary | ICD-10-CM | POA: Diagnosis not present

## 2022-03-19 DIAGNOSIS — D649 Anemia, unspecified: Secondary | ICD-10-CM | POA: Diagnosis not present

## 2022-03-19 DIAGNOSIS — Z950 Presence of cardiac pacemaker: Secondary | ICD-10-CM | POA: Diagnosis not present

## 2022-03-27 ENCOUNTER — Non-Acute Institutional Stay: Payer: Medicare Other | Admitting: Internal Medicine

## 2022-03-27 ENCOUNTER — Encounter: Payer: Self-pay | Admitting: Internal Medicine

## 2022-03-27 VITALS — BP 170/72 | HR 60 | Temp 98.0°F | Resp 18 | Ht 66.0 in

## 2022-03-27 DIAGNOSIS — Q282 Arteriovenous malformation of cerebral vessels: Secondary | ICD-10-CM

## 2022-03-27 DIAGNOSIS — E782 Mixed hyperlipidemia: Secondary | ICD-10-CM | POA: Diagnosis not present

## 2022-03-27 DIAGNOSIS — Z950 Presence of cardiac pacemaker: Secondary | ICD-10-CM | POA: Diagnosis not present

## 2022-03-27 DIAGNOSIS — I10 Essential (primary) hypertension: Secondary | ICD-10-CM

## 2022-03-27 DIAGNOSIS — R197 Diarrhea, unspecified: Secondary | ICD-10-CM

## 2022-03-27 DIAGNOSIS — D509 Iron deficiency anemia, unspecified: Secondary | ICD-10-CM | POA: Diagnosis not present

## 2022-03-27 DIAGNOSIS — G40209 Localization-related (focal) (partial) symptomatic epilepsy and epileptic syndromes with complex partial seizures, not intractable, without status epilepticus: Secondary | ICD-10-CM | POA: Diagnosis not present

## 2022-03-27 DIAGNOSIS — I480 Paroxysmal atrial fibrillation: Secondary | ICD-10-CM | POA: Diagnosis not present

## 2022-03-28 ENCOUNTER — Encounter: Payer: Self-pay | Admitting: Orthopedic Surgery

## 2022-03-28 ENCOUNTER — Encounter: Payer: Self-pay | Admitting: Gastroenterology

## 2022-03-28 ENCOUNTER — Non-Acute Institutional Stay: Payer: Medicare Other | Admitting: Orthopedic Surgery

## 2022-03-28 DIAGNOSIS — R531 Weakness: Secondary | ICD-10-CM

## 2022-03-28 DIAGNOSIS — R197 Diarrhea, unspecified: Secondary | ICD-10-CM

## 2022-03-28 NOTE — Progress Notes (Signed)
Location:  [facility] Room Number: 324/M Place of Service:  ALF [phone]) Provider:  Yvonna Alanis, NP   Virgie Dad, MD  Patient Care Team: Virgie Dad, MD as PCP - General (Internal Medicine) [place], [name], MD as PCP - Cardiology (Cardiology)  Extended Emergency Contact Information Primary Emergency Contact: Tracy Mobile Phone: [phone] Relation: Daughter Secondary Emergency Contact: MIKKA, KISSNER Mobile Phone: [phone] Relation: Son  Code Status:  DNR Goals of care: Advanced Directive information    03/27/2022    1:00 PM  Advanced Directives  Does Patient Have a Medical Advance Directive? Yes  Type of Paramedic of [place];Living will;Out of facility DNR (pink MOST or yellow form)  Does patient want to make changes to medical advance directive? No - Patient declined  Copy of [place] in Chart? Yes - validated most recent copy scanned in chart (See row information)     Chief Complaint  Patient presents with   Acute Visit    Diarrhea/weakness    HPI:  Pt is a 86 y.o. female seen today for acute visit due to ongoing diarrhea.   Ongoing diarrhea x 2 weeks. Reports only one episode every morning.Described as moderate amount of brown water. She was taking magnesium, but stopped 10/17 by Dr. Lyndel Safe. She has taken Imodium intermittently and symptoms have resolved. Nursing gave her one dose this morning. Denies fever, chills, abdominal pain, constipation, nausea or vomiting. H/o appendectomy. Denies food allergies. She does have a umbilical hernia, denies any changes. Evaluated by dietary today, placed on bland diet.    In the past 2 days, she has had trouble getting out of bed. Admits to increased weakness. No recent falls. Ambulating with walker, 1+ assist.     Past Medical History:  Diagnosis Date   Abnormal cardiovascular stress test    Carotid arterial disease (HCC)     MODERATE RIGHT   Cerebral AV malformation    DDD (degenerative disc disease)    SEVERE WITH SPINAL STENOSIS   Hypercholesterolemia    Hypertension    MVP (mitral valve prolapse)    MILD   Numbness    LEFT FOOT AND ANKLE   Osteopenia    Pseudo-gout    Past Surgical History:  Procedure Laterality Date   APPENDECTOMY     BREAST MASS EXCISION     CARPAL TUNNEL RELEASE     PACEMAKER IMPLANT N/A 11/07/2021   Procedure: PACEMAKER IMPLANT;  Surgeon: Evans Lance, MD;  Location: [facility];  Service: Cardiovascular;  Laterality: N/A;   REMOVAL OF PARATHYROID GLAND     STRESS NUCLEAR STUDY     ABNORMAL   TONSILLECTOMY      Allergies  Allergen Reactions   Aleve [Naproxen] Other (See Comments)   Azithromycin Other (See Comments)    Reaction not recalled   Penicillins Hives and Other (See Comments)    Bad reaction as a child after a shot of Penicillin   Pravastatin Other (See Comments)    Reaction not recalled   Sulfa Antibiotics Other (See Comments)   Sulfa Drugs Cross Reactors Other (See Comments)    Reaction not recalled- stated she can take it "in small doses"   Tape Itching and Other (See Comments)    Cannot wear for an extended period of time   Naproxen Sodium Rash    Outpatient Encounter Medications as of 03/28/2022  Medication Sig   acetaminophen (TYLENOL) 500 MG tablet  Take 500 mg by mouth every 6 (six) hours as needed for mild pain.   B Complex Vitamins (VITAMIN-B COMPLEX) TABS Take 1 tablet by mouth daily.   docusate sodium (COLACE) 100 MG capsule Take 100 mg by mouth daily.   fluticasone (FLONASE) 50 MCG/ACT nasal spray Place 1-2 sprays into both nostrils daily.   loperamide (IMODIUM A-D) 2 MG tablet Take 1 tablet (2 mg total) by mouth as needed for diarrhea or loose stools.   Multiple Vitamins-Minerals (MULTI COMPLETE/IRON) TABS Take 1 tablet by mouth daily with breakfast. Folic acid included   ondansetron (ZOFRAN-ODT) 4 MG disintegrating tablet Take one  tablet by mouth every 4 hours as needed.   PHENobarbital (LUMINAL) 64.8 MG tablet Take 1 tablet (64.8 mg total) by mouth in the morning.   phenytoin (DILANTIN) 100 MG ER capsule Take 200 mg by mouth daily with breakfast.   Probiotic Product (ALIGN) 4 MG CAPS Take 1 capsule by mouth every morning.   SYSTANE ULTRA PF 0.4-0.3 % SOLN Place 1 drop into both eyes in the morning and at bedtime.   valsartan (DIOVAN) 160 MG tablet Take 1 tablet (160 mg total) by mouth daily.   No facility-administered encounter medications on file as of 03/28/2022.    Review of Systems  Constitutional:  Positive for activity change. Negative for appetite change, fatigue and fever.  HENT:  Negative for congestion and trouble swallowing.   Eyes:  Negative for visual disturbance.  Respiratory:  Negative for cough, shortness of breath and wheezing.   Cardiovascular:  Negative for chest pain and leg swelling.  Gastrointestinal:  Positive for diarrhea. Negative for abdominal distention, abdominal pain, blood in stool, constipation, nausea and vomiting.  Genitourinary:  Negative for dysuria and frequency.  Musculoskeletal:  Positive for gait problem.  Skin:  Negative for wound.  Neurological:  Positive for weakness. Negative for dizziness and headaches.  Psychiatric/Behavioral:  Negative for confusion and dysphoric mood. The patient is not nervous/anxious.     Immunization History  Administered Date(s) Administered   Influenza Split 03/28/2009, 03/17/2010, 03/26/2011, 04/01/2012, 03/05/2013   Influenza, Quadrivalent, Recombinant, Inj, Pf 03/08/2018, 02/13/2019, 02/24/2020   Influenza,inj,Quad PF,6+ Mos 02/25/2014, 03/05/2015   Influenza-Unspecified 02/09/2014, 02/25/2016, 04/06/2017, 04/25/2021   Moderna Sars-Covid-2 Vaccination 07/17/2019, 08/14/2019, 02/24/2020, 04/26/2020   Pneumococcal Conjugate-13 11/26/2014   Pneumococcal-Unspecified 06/23/2003, 11/10/2012   Td (Adult), 2 Lf Tetanus Toxid, Preservative Free  06/11/2000, 11/06/2011   Unspecified SARS-COV-2 Vaccination 04/28/2021   Zoster Recombinat (Shingrix) 01/09/2019, 05/08/2019   Zoster, Live 01/15/2006, 01/15/2019   Pertinent  Health Maintenance Due  Topic Date Due   DEXA SCAN  Never done   INFLUENZA VACCINE  01/09/2022      11/07/2021   11:40 AM 01/27/2022    8:20 AM 01/27/2022    8:00 PM 01/28/2022    8:00 AM 03/27/2022   12:59 PM  [place] in the past year?     0  Was there an injury with Fall?     0  Fall Risk Category Calculator     0  Fall Risk Category     Low  Patient Fall Risk Level Low fall risk Low fall risk Moderate fall risk Moderate fall risk Low fall risk  Patient at Risk for Falls Due to     No Fall Risks  Fall risk Follow up     Falls evaluation completed   Functional Status Survey:    Vitals:   03/28/22 1552  BP: (!) 166/61  Pulse: 68  Resp: 20  Temp: 98.8 F (37.1 C)  SpO2: 97%  Weight: 137 lb 6.4 oz (62.3 kg)   Body mass index is 22.18 kg/m. Physical Exam Vitals reviewed.  Constitutional:      General: She is not in acute distress. HENT:     Head: Normocephalic.  Eyes:     General:        Right eye: No discharge.        Left eye: No discharge.  Cardiovascular:     Rate and Rhythm: Normal rate. Rhythm irregular.     Pulses: Normal pulses.     Heart sounds: Normal heart sounds.  Pulmonary:     Effort: Pulmonary effort is normal. No respiratory distress.     Breath sounds: Normal breath sounds. No wheezing.  Abdominal:     General: Bowel sounds are normal. There is no distension.     Palpations: Abdomen is soft.     Tenderness: There is no abdominal tenderness. There is no guarding.     Comments: Umbilical hernia present, bowel normal  Musculoskeletal:     Cervical back: Neck supple.     Right lower leg: Edema present.     Left lower leg: Edema present.     Comments: 1+ pitting  Skin:    General: Skin is warm and dry.     Capillary Refill: Capillary refill takes less than 2  seconds.  Neurological:     General: No focal deficit present.     Mental Status: She is alert and oriented to person, place, and time.     Motor: Weakness present.     Gait: Gait abnormal.     Comments: walker  Psychiatric:        Mood and Affect: Mood normal.        Behavior: Behavior normal.     Labs reviewed: Recent Labs    04/17/21 0000 04/25/21 0000 10/17/21 0037 01/27/22 0830 01/27/22 1116 01/28/22 0331  NA 134*  134*   < > 140 137  --  135  K 4.0  4.0   < > 4.0 4.0  --  3.9  CL 96*  96*   < > 109 103  --  102  CO2 26*  26*   < > 24 25  --  24  GLUCOSE  --    < > 103* 126*  --  95  BUN 24*  24*   < > 28* 28*  --  27*  CREATININE 0.9  0.9   < > 1.33* 1.29*  --  1.08*  CALCIUM 8.9  8.9   < > 8.5* 9.4  --  9.0  MG 1.7  --  1.4*  --  1.7  --    < > = values in this interval not displayed.   Recent Labs    10/15/21 1456 10/16/21 0110 01/27/22 0830  AST 38 29 22  ALT 33 26 12  ALKPHOS 61 52 72  BILITOT 0.6 0.5 0.6  PROT 6.2* 5.5* 6.5  ALBUMIN 3.4* 3.1* 3.5   Recent Labs    05/10/21 1113 06/21/21 1430 10/15/21 1456 10/16/21 0110 11/09/21 0000 01/27/22 0830 01/28/22 0331  WBC 5.3 7.8 5.6 5.7 8.3 10.6* 6.5  NEUTROABS 3.8 5,686 4.2  --   --   --   --   HGB 9.5* 10.6* 9.1* 8.4* 9.9* 9.5* 8.4*  HCT 27.6* 30.9* 28.1* 26.0* 29* 28.9* 25.1*  MCV 97.9 96.9 104.9* 104.0*  --  101.4* 98.8  PLT 249.0 288 172 148* 158 257 227   Lab Results  Component Value Date   TSH 1.914 01/27/2022   No results found for: "HGBA1C" Lab Results  Component Value Date   CHOL 208 (H) 01/28/2022   HDL 80 01/28/2022   LDLCALC 116 (H) 01/28/2022   TRIG 59 01/28/2022   CHOLHDL 2.6 01/28/2022    Significant Diagnostic Results in last 30 days:  No results found.  Assessment/Plan 1. Diarrhea, unspecified type - ongoing, onset x 2 weeks - episodes only in AM daily - improved with Imodium prn - exam unremarkable- umbilical hernia present - evaluated by dietary -  stool panel- include WBC/GI pathogens/Cdiff - check for impaction - cbc/diff - cmp  2. Weakness - see above - suspect dehydration and decreased caloric intake - advised to increase fluids and calories - cont using walker - may consider PT/OT evaluation in future    Family/ staff Communication: plan discussed with patient and nurse  Labs/tests ordered:   cbc/diff, cmp, stool panel

## 2022-03-28 NOTE — Progress Notes (Signed)
Location:  [place] of Service:  Clinic (12)  Provider:   Code Status: DNR Goals of Care:     03/27/2022    1:00 PM  Advanced Directives  Does Patient Have a Medical Advance Directive? Yes  Type of Paramedic of [place];Living will;Out of facility DNR (pink MOST or yellow form)  Does patient want to make changes to medical advance directive? No - Patient declined  Copy of [place] in Chart? Yes - validated most recent copy scanned in chart (See row information)     Chief Complaint  Patient presents with   Establish Care    NP to Establish. Patient states she is just overall feeling bad and weak    HPI: Patient is a 86 y.o. female seen today for medical management of chronic diseases.    Patient has moved to [place] in [place] and wants to establish care  S/p PPM placement on 05/30 for [facility] Chest pain in 08/19 Diagnosed with Pericarditis with Pericardial Effusion Was on Colchicine A Fib Amiodarone tried but then taken off due to GI side effects Not on anticoagulation due to cerebral AVMs.  Not a candidate for Watchman device either Hypertension Blood pressure in the facility has been running high. Diarrhea This seems to be a chronic problem for her .  She says she will have diarrhea for couple of days and then it stops .  The staff at held her magnesium and per patient that did improve her diarrhea She does feel weak after that Also denies any abdominal pain nausea vomiting. Her weight has been stable.  Does not get up at night to go to the bathroom.  No blood in her stool.  Modifies her diet to help her diarrhea History ofcomplex partial epilepsy since she was in her teen Had a work-up at [facility] and has been on Dilantin since then History of unhealed right humerus shaft fracture Peripheral neuropathy   Was on Wheelchair today has been using her walker in her Room  Wt Readings from Last 3  Encounters:  03/05/22 132 lb 9.6 oz (60.1 kg)  02/20/22 133 lb (60.3 kg)  01/28/22 134 lb 3.2 oz (60.9 kg)    Past Medical History:  Diagnosis Date   Abnormal cardiovascular stress test    Carotid arterial disease (HCC)    MODERATE RIGHT   Cerebral AV malformation    DDD (degenerative disc disease)    SEVERE WITH SPINAL STENOSIS   Hypercholesterolemia    Hypertension    MVP (mitral valve prolapse)    MILD   Numbness    LEFT FOOT AND ANKLE   Osteopenia    Pseudo-gout     Past Surgical History:  Procedure Laterality Date   APPENDECTOMY     BREAST MASS EXCISION     CARPAL TUNNEL RELEASE     PACEMAKER IMPLANT N/A 11/07/2021   Procedure: PACEMAKER IMPLANT;  Surgeon: Evans Lance, MD;  Location: [facility];  Service: Cardiovascular;  Laterality: N/A;   REMOVAL OF PARATHYROID GLAND     STRESS NUCLEAR STUDY     ABNORMAL   TONSILLECTOMY      Allergies  Allergen Reactions   Aleve [Naproxen] Other (See Comments)   Azithromycin Other (See Comments)    Reaction not recalled   Penicillins Hives and Other (See Comments)    Bad reaction as a child after a shot of Penicillin   Pravastatin Other (See Comments)  Reaction not recalled   Sulfa Antibiotics Other (See Comments)   Sulfa Drugs Cross Reactors Other (See Comments)    Reaction not recalled- stated she can take it "in small doses"   Tape Itching and Other (See Comments)    Cannot wear for an extended period of time   Naproxen Sodium Rash    Outpatient Encounter Medications as of 03/27/2022  Medication Sig   acetaminophen (TYLENOL) 500 MG tablet Take 500 mg by mouth every 6 (six) hours as needed for mild pain.   B Complex Vitamins (VITAMIN-B COMPLEX) TABS Take 1 tablet by mouth daily.   docusate sodium (COLACE) 100 MG capsule Take 100 mg by mouth daily.   fluticasone (FLONASE) 50 MCG/ACT nasal spray Place 1-2 sprays into both nostrils daily.   loperamide (IMODIUM A-D) 2 MG tablet Take 1 tablet (2 mg total) by  mouth as needed for diarrhea or loose stools.   Multiple Vitamins-Minerals (MULTI COMPLETE/IRON) TABS Take 1 tablet by mouth daily with breakfast. Folic acid included   ondansetron (ZOFRAN-ODT) 4 MG disintegrating tablet Take one tablet by mouth every 4 hours as needed.   PHENobarbital (LUMINAL) 64.8 MG tablet Take 1 tablet (64.8 mg total) by mouth in the morning.   phenytoin (DILANTIN) 100 MG ER capsule Take 200 mg by mouth daily with breakfast.   Probiotic Product (ALIGN) 4 MG CAPS Take 1 capsule by mouth every morning.   SYSTANE ULTRA PF 0.4-0.3 % SOLN Place 1 drop into both eyes in the morning and at bedtime.   valsartan (DIOVAN) 160 MG tablet Take 1 tablet (160 mg total) by mouth daily.   [DISCONTINUED] Magnesium 200 MG TABS Take 400 mg by mouth in the morning and at bedtime.   [DISCONTINUED] metroNIDAZOLE (FLAGYL) 500 MG tablet Take 500 mg by mouth 3 (three) times daily.   No facility-administered encounter medications on file as of 03/27/2022.    Review of Systems:  Review of Systems  Constitutional:  Positive for activity change. Negative for appetite change.  HENT: Negative.    Respiratory:  Negative for cough and shortness of breath.   Cardiovascular:  Negative for leg swelling.  Gastrointestinal:  Positive for diarrhea. Negative for constipation.  Genitourinary: Negative.   Musculoskeletal:  Positive for gait problem. Negative for arthralgias and myalgias.  Skin: Negative.   Neurological:  Positive for weakness. Negative for dizziness.  Psychiatric/Behavioral:  Negative for confusion, dysphoric mood and sleep disturbance.     Health Maintenance  Topic Date Due   TETANUS/TDAP  Never done   DEXA SCAN  Never done   Pneumonia Vaccine 56+ Years old (2 - PPSV23 or PCV20) 11/26/2015   COVID-19 Vaccine (6 - Moderna risk series) 06/23/2021   INFLUENZA VACCINE  01/09/2022   Zoster Vaccines- Shingrix  Completed   HPV VACCINES  Aged Out    Physical Exam: Vitals:   03/27/22  1258  BP: (!) 170/72  Pulse: 60  Resp: 18  Temp: 98 F (36.7 C)  TempSrc: Temporal  SpO2: 97%  Height: '5\' 6"'$  (1.676 m)   Body mass index is 21.4 kg/m. Physical Exam Vitals reviewed.  Constitutional:      Appearance: Normal appearance.  HENT:     Head: Normocephalic.     Nose: Nose normal.     Mouth/Throat:     Mouth: Mucous membranes are moist.     Pharynx: Oropharynx is clear.  Eyes:     Pupils: Pupils are equal, round, and reactive to light.  Cardiovascular:  Rate and Rhythm: Normal rate. Rhythm irregular.     Pulses: Normal pulses.     Heart sounds: Normal heart sounds. No murmur heard. Pulmonary:     Effort: Pulmonary effort is normal.     Breath sounds: Normal breath sounds.  Abdominal:     General: Abdomen is flat. Bowel sounds are normal.     Palpations: Abdomen is soft.  Musculoskeletal:        General: No swelling.     Cervical back: Neck supple.  Skin:    General: Skin is warm.  Neurological:     General: No focal deficit present.     Mental Status: She is alert and oriented to person, place, and time.  Psychiatric:        Mood and Affect: Mood normal.        Thought Content: Thought content normal.     Labs reviewed: Basic Metabolic Panel: Recent Labs    04/17/21 0000 04/25/21 0000 10/17/21 0037 01/27/22 0830 01/27/22 1116 01/28/22 0331  NA 134*  134*   < > 140 137  --  135  K 4.0  4.0   < > 4.0 4.0  --  3.9  CL 96*  96*   < > 109 103  --  102  CO2 26*  26*   < > 24 25  --  24  GLUCOSE  --    < > 103* 126*  --  95  BUN 24*  24*   < > 28* 28*  --  27*  CREATININE 0.9  0.9   < > 1.33* 1.29*  --  1.08*  CALCIUM 8.9  8.9   < > 8.5* 9.4  --  9.0  MG 1.7  --  1.4*  --  1.7  --   TSH  --   --   --   --  1.914  --    < > = values in this interval not displayed.   Liver Function Tests: Recent Labs    10/15/21 1456 10/16/21 0110 01/27/22 0830  AST 38 29 22  ALT 33 26 12  ALKPHOS 61 52 72  BILITOT 0.6 0.5 0.6  PROT 6.2* 5.5*  6.5  ALBUMIN 3.4* 3.1* 3.5   Recent Labs    04/10/21 1027 10/15/21 1456 01/27/22 0830  LIPASE 61* 54* 53*   No results for input(s): "AMMONIA" in the last 8760 hours. CBC: Recent Labs    05/10/21 1113 06/21/21 1430 10/15/21 1456 10/16/21 0110 11/09/21 0000 01/27/22 0830 01/28/22 0331  WBC 5.3 7.8 5.6 5.7 8.3 10.6* 6.5  NEUTROABS 3.8 5,686 4.2  --   --   --   --   HGB 9.5* 10.6* 9.1* 8.4* 9.9* 9.5* 8.4*  HCT 27.6* 30.9* 28.1* 26.0* 29* 28.9* 25.1*  MCV 97.9 96.9 104.9* 104.0*  --  101.4* 98.8  PLT 249.0 288 172 148* 158 257 227   Lipid Panel: Recent Labs    01/28/22 1254  CHOL 208*  HDL 80  LDLCALC 116*  TRIG 59  CHOLHDL 2.6   No results found for: "HGBA1C"  Procedures since last visit: No results found.  Assessment/Plan 1. Diarrhea, unspecified type Repeat Labs Discontinue Magnesium and Colace If Diarrhea persists will do GI consult again Was seen by GI in 11/22 And CT scan of abdomen and Occult was negative  2. Essential hypertension Check BP BID  Follow in 2 weeks She does not want me to change her meds right now  3.  Paroxysmal A-fib (HCC) Off Amiodarone per Cardiology No Anticoagulation due to Cerebral AVMS  4. S/P placement of cardiac pacemaker Doing well  5. Cerebral AV malformation   6. Mixed hyperlipidemia Not on Statin right now Was taken off when she was started on Colchicine Will restart it next visit  7. Partial symptomatic epilepsy with complex partial seizures, not intractable, without status epilepticus (HCC) Continue Dilantin and Pheno barb  8. Iron deficiency anemia, unspecified iron deficiency anemia type On Iron Needs follow up with GI if diarrhea persists    Labs/tests ordered:  * No order type specified * Next appt:  04/17/2022

## 2022-03-29 DIAGNOSIS — R197 Diarrhea, unspecified: Secondary | ICD-10-CM | POA: Diagnosis not present

## 2022-03-29 DIAGNOSIS — K591 Functional diarrhea: Secondary | ICD-10-CM | POA: Diagnosis not present

## 2022-03-29 LAB — BASIC METABOLIC PANEL
BUN: 21 (ref 4–21)
CO2: 27 — AB (ref 13–22)
Chloride: 100 (ref 99–108)
Creatinine: 0.9 (ref 0.5–1.1)
Glucose: 97
Potassium: 4 mEq/L (ref 3.5–5.1)
Sodium: 137 (ref 137–147)

## 2022-03-29 LAB — CBC: RBC: 2.79 — AB (ref 3.87–5.11)

## 2022-03-29 LAB — CBC AND DIFFERENTIAL
HCT: 28 — AB (ref 36–46)
Hemoglobin: 9.6 — AB (ref 12.0–16.0)
Platelets: 325 10*3/uL (ref 150–400)
WBC: 5.8

## 2022-03-29 LAB — COMPREHENSIVE METABOLIC PANEL
Albumin: 3 — AB (ref 3.5–5.0)
Calcium: 8.8 (ref 8.7–10.7)
Globulin: 2.2
eGFR: 64

## 2022-03-29 LAB — HEPATIC FUNCTION PANEL
ALT: 17 U/L (ref 7–35)
AST: 27 (ref 13–35)
Alkaline Phosphatase: 75 (ref 25–125)
Bilirubin, Total: 0.2

## 2022-04-03 ENCOUNTER — Encounter: Payer: Self-pay | Admitting: Internal Medicine

## 2022-04-03 DIAGNOSIS — I10 Essential (primary) hypertension: Secondary | ICD-10-CM | POA: Diagnosis not present

## 2022-04-03 LAB — CBC AND DIFFERENTIAL
HCT: 30 — AB (ref 36–46)
Hemoglobin: 10.3 — AB (ref 12.0–16.0)
Platelets: 384 10*3/uL (ref 150–400)
WBC: 6.9

## 2022-04-03 LAB — COMPREHENSIVE METABOLIC PANEL
Calcium: 8.8 (ref 8.7–10.7)
eGFR: 65

## 2022-04-03 LAB — BASIC METABOLIC PANEL
BUN: 19 (ref 4–21)
CO2: 25 — AB (ref 13–22)
Chloride: 99 (ref 99–108)
Creatinine: 0.9 (ref 0.5–1.1)
Glucose: 93
Potassium: 4.1 mEq/L (ref 3.5–5.1)
Sodium: 137 (ref 137–147)

## 2022-04-03 LAB — CBC: RBC: 3.06 — AB (ref 3.87–5.11)

## 2022-04-05 ENCOUNTER — Non-Acute Institutional Stay: Payer: Medicare Other | Admitting: Adult Health

## 2022-04-05 ENCOUNTER — Encounter: Payer: Self-pay | Admitting: Adult Health

## 2022-04-05 DIAGNOSIS — A498 Other bacterial infections of unspecified site: Secondary | ICD-10-CM

## 2022-04-05 DIAGNOSIS — G629 Polyneuropathy, unspecified: Secondary | ICD-10-CM | POA: Diagnosis not present

## 2022-04-05 DIAGNOSIS — I6523 Occlusion and stenosis of bilateral carotid arteries: Secondary | ICD-10-CM

## 2022-04-05 DIAGNOSIS — R531 Weakness: Secondary | ICD-10-CM | POA: Diagnosis not present

## 2022-04-05 NOTE — Progress Notes (Signed)
Location:  [facility] of Service:  [facility] (13) Provider:   Cindi Carbon, [place] [phone]   Virgie Dad, MD  Patient Care Team: Virgie Dad, MD as PCP - General (Internal Medicine) Martinique, [name], MD as PCP - Cardiology (Cardiology)  Extended Emergency Contact Information Primary Emergency Contact: [place] Mobile Phone: [phone] Relation: Daughter Secondary Emergency Contact: WINTA, BARCELO Mobile Phone: [phone] Relation: Son   Goals of care: Advanced Directive information    03/27/2022    1:00 PM  Advanced Directives  Does Patient Have a Medical Advance Directive? Yes  Type of Paramedic of [place];Living will;Out of facility DNR (pink MOST or yellow form)  Does patient want to make changes to medical advance directive? No - Patient declined  Copy of [place] in Chart? Yes - validated most recent copy scanned in chart (See row information)     Chief Complaint  Patient presents with   Acute Visit    F/u cdiff    HPI:  Pt is a 86 y.o. female seen today for an acute visit for f/u regarding cdiff.   She was having diarrhea and was seen on 10/17 and there was concern for possible s/e due to taking Mag and this was stopped. Then seen again on 10/18 and stool specimen was taken. This returned positive for cdiff. She was placed on vancomycin oral. She is now having formed stools. Appetite has improved. No fever, nausea, or abd pain.   CBC and BMP returned with no acute changes on 10/19. Repeat 10/24 Hgb 10.3, WBC 6.9 NA 137, BUN 19.1 Cr 0.88  Has had several episodes of diarrhea.  CT of the abd 04/27/21 showed no acute findings with cholelithiasis, fat containing hernia.   Nurses reports she is weaker and needs more assistance but is making gains.   She needs help getting in the shower, has a hx of neuropathy in both feet for years per her account and  has needed shower assistance since [place].  Past Medical History:  Diagnosis Date   Abnormal cardiovascular stress test    Carotid arterial disease (HCC)    MODERATE RIGHT   Cerebral AV malformation    DDD (degenerative disc disease)    SEVERE WITH SPINAL STENOSIS   Hypercholesterolemia    Hypertension    MVP (mitral valve prolapse)    MILD   Numbness    LEFT FOOT AND ANKLE   Osteopenia    Pseudo-gout    Past Surgical History:  Procedure Laterality Date   APPENDECTOMY     BREAST MASS EXCISION     CARPAL TUNNEL RELEASE     PACEMAKER IMPLANT N/A 11/07/2021   Procedure: PACEMAKER IMPLANT;  Surgeon: Evans Lance, MD;  Location: [facility];  Service: Cardiovascular;  Laterality: N/A;   REMOVAL OF PARATHYROID GLAND     STRESS NUCLEAR STUDY     ABNORMAL   TONSILLECTOMY      Allergies  Allergen Reactions   Aleve [Naproxen] Other (See Comments)   Azithromycin Other (See Comments)    Reaction not recalled   Penicillins Hives and Other (See Comments)    Bad reaction as a child after a shot of Penicillin   Pravastatin Other (See Comments)    Reaction not recalled   Sulfa Antibiotics Other (See Comments)   Sulfa Drugs Cross Reactors Other (See Comments)    Reaction not recalled- stated she can  take it "in small doses"   Tape Itching and Other (See Comments)    Cannot wear for an extended period of time   Naproxen Sodium Rash    Outpatient Encounter Medications as of 04/05/2022  Medication Sig   nystatin powder Apply 1 Application topically 2 (two) times daily.   saccharomyces boulardii (FLORASTOR) 250 MG capsule Take 250 mg by mouth 2 (two) times daily.   vancomycin (VANCOCIN) 125 MG capsule Take 125 mg by mouth 4 (four) times daily.   acetaminophen (TYLENOL) 500 MG tablet Take 500 mg by mouth every 6 (six) hours as needed for mild pain.   B Complex Vitamins (VITAMIN-B COMPLEX) TABS Take 1 tablet by mouth daily.   docusate sodium (COLACE) 100 MG capsule  Take 100 mg by mouth daily.   fluticasone (FLONASE) 50 MCG/ACT nasal spray Place 1-2 sprays into both nostrils daily.   loperamide (IMODIUM A-D) 2 MG tablet Take 1 tablet (2 mg total) by mouth as needed for diarrhea or loose stools.   Multiple Vitamins-Minerals (MULTI COMPLETE/IRON) TABS Take 1 tablet by mouth daily with breakfast. Folic acid included   ondansetron (ZOFRAN-ODT) 4 MG disintegrating tablet Take one tablet by mouth every 4 hours as needed.   PHENobarbital (LUMINAL) 64.8 MG tablet Take 1 tablet (64.8 mg total) by mouth in the morning.   phenytoin (DILANTIN) 100 MG ER capsule Take 200 mg by mouth daily with breakfast.   Probiotic Product (ALIGN) 4 MG CAPS Take 1 capsule by mouth every morning. (Patient not taking: Reported on 04/05/2022)   SYSTANE ULTRA PF 0.4-0.3 % SOLN Place 1 drop into both eyes in the morning and at bedtime.   valsartan (DIOVAN) 160 MG tablet Take 1 tablet (160 mg total) by mouth daily.   No facility-administered encounter medications on file as of 04/05/2022.    Review of Systems  Constitutional:  Positive for activity change. Negative for appetite change, chills, diaphoresis, fatigue, fever and unexpected weight change.  HENT:  Negative for congestion.   Respiratory:  Negative for cough, shortness of breath and wheezing.   Cardiovascular:  Positive for leg swelling. Negative for chest pain and palpitations.  Gastrointestinal:  Negative for abdominal distention, abdominal pain, constipation and diarrhea.  Genitourinary:  Negative for difficulty urinating and dysuria.  Musculoskeletal:  Positive for gait problem. Negative for arthralgias, back pain, joint swelling and myalgias.  Neurological:  Positive for weakness. Negative for dizziness, tremors, seizures, syncope, facial asymmetry, speech difficulty, light-headedness, numbness and headaches.  Psychiatric/Behavioral:  Negative for agitation, behavioral problems and confusion.     Immunization History   Administered Date(s) Administered   Influenza Split 03/28/2009, 03/17/2010, 03/26/2011, 04/01/2012, 03/05/2013   Influenza, Quadrivalent, Recombinant, Inj, Pf 03/08/2018, 02/13/2019, 02/24/2020   Influenza,inj,Quad PF,6+ Mos 02/25/2014, 03/05/2015   Influenza-Unspecified 02/09/2014, 02/25/2016, 04/06/2017, 04/25/2021   Moderna Sars-Covid-2 Vaccination 07/17/2019, 08/14/2019, 02/24/2020, 04/26/2020   Pneumococcal Conjugate-13 11/26/2014   Pneumococcal-Unspecified 06/23/2003, 11/10/2012   Td (Adult), 2 Lf Tetanus Toxid, Preservative Free 06/11/2000, 11/06/2011   Unspecified SARS-COV-2 Vaccination 04/28/2021   Zoster Recombinat (Shingrix) 01/09/2019, 05/08/2019   Zoster, Live 01/15/2006, 01/15/2019   Pertinent  Health Maintenance Due  Topic Date Due   DEXA SCAN  Never done   INFLUENZA VACCINE  01/09/2022      11/07/2021   11:40 AM 01/27/2022    8:20 AM 01/27/2022    8:00 PM 01/28/2022    8:00 AM 03/27/2022   12:59 PM  [place] in the past year?  0  Was there an injury with Fall?     0  Fall Risk Category Calculator     0  Fall Risk Category     Low  Patient Fall Risk Level Low fall risk Low fall risk Moderate fall risk Moderate fall risk Low fall risk  Patient at Risk for Falls Due to     No Fall Risks  Fall risk Follow up     Falls evaluation completed   Functional Status Survey:    Vitals:   04/05/22 1631  BP: (!) 153/64  Pulse: 65  Resp: 16  Temp: 98.2 F (36.8 C)  SpO2: 95%   There is no height or weight on file to calculate BMI. Physical Exam Vitals and nursing note reviewed.  Constitutional:      General: She is not in acute distress.    Appearance: She is not diaphoretic.  HENT:     Head: Normocephalic and atraumatic.     Mouth/Throat:     Mouth: Mucous membranes are moist.     Pharynx: Oropharynx is clear.  Neck:     Vascular: No JVD.  Cardiovascular:     Rate and Rhythm: Normal rate and regular rhythm.     Heart sounds: No murmur  heard. Pulmonary:     Effort: Pulmonary effort is normal. No respiratory distress.     Breath sounds: Normal breath sounds. No wheezing.  Abdominal:     General: Abdomen is flat. Bowel sounds are normal. There is no distension.     Palpations: Abdomen is soft.     Tenderness: There is no abdominal tenderness. There is no right CVA tenderness, left CVA tenderness or guarding.     Hernia: A hernia is present.  Musculoskeletal:     Comments: BLE edema +2  Skin:    General: Skin is warm and dry.  Neurological:     Mental Status: She is alert and oriented to person, place, and time.  Psychiatric:        Mood and Affect: Mood normal.     Labs reviewed: Recent Labs    04/17/21 0000 04/25/21 0000 10/17/21 0037 01/27/22 0830 01/27/22 1116 01/28/22 0331 03/29/22 0000  NA 134*  134*   < > 140 137  --  135 137  K 4.0  4.0   < > 4.0 4.0  --  3.9 4.0  CL 96*  96*   < > 109 103  --  102 100  CO2 26*  26*   < > 24 25  --  24 27*  GLUCOSE  --    < > 103* 126*  --  95  --   BUN 24*  24*   < > 28* 28*  --  27* 21  CREATININE 0.9  0.9   < > 1.33* 1.29*  --  1.08* 0.9  CALCIUM 8.9  8.9   < > 8.5* 9.4  --  9.0 8.8  MG 1.7  --  1.4*  --  1.7  --   --    < > = values in this interval not displayed.   Recent Labs    10/15/21 1456 10/16/21 0110 01/27/22 0830 03/29/22 0000  AST 38 '29 22 27  '$ ALT 33 '26 12 17  '$ ALKPHOS 61 52 72 75  BILITOT 0.6 0.5 0.6  --   PROT 6.2* 5.5* 6.5  --   ALBUMIN 3.4* 3.1* 3.5 3.0*   Recent Labs    05/10/21 1113 06/21/21 1430  10/15/21 1456 10/16/21 0110 11/09/21 0000 01/27/22 0830 01/28/22 0331 03/29/22 0000  WBC 5.3 7.8 5.6 5.7   < > 10.6* 6.5 5.8  NEUTROABS 3.8 5,686 4.2  --   --   --   --   --   HGB 9.5* 10.6* 9.1* 8.4*   < > 9.5* 8.4* 9.6*  HCT 27.6* 30.9* 28.1* 26.0*   < > 28.9* 25.1* 28*  MCV 97.9 96.9 104.9* 104.0*  --  101.4* 98.8  --   PLT 249.0 288 172 148*   < > 257 227 325   < > = values in this interval not displayed.   Lab  Results  Component Value Date   TSH 1.914 01/27/2022   No results found for: "HGBA1C" Lab Results  Component Value Date   CHOL 208 (H) 01/28/2022   HDL 80 01/28/2022   LDLCALC 116 (H) 01/28/2022   TRIG 59 01/28/2022   CHOLHDL 2.6 01/28/2022    Significant Diagnostic Results in last 30 days:  No results found.  Assessment/Plan 1. Clostridioides difficile infection Improved Slowly easing back into her regular eating habits Complete course of vancomycin.   2. Weakness Improving.  She said she would like to hold off on therapy until next week and if she still needs help we can consult with them.   3. Neuropathy Per patient for years No issues with pain does affect gait.  Normal B12   Family/ staff Communication: resident and nurse   Labs/tests ordered:  NA   Total time 43mn:  time greater than 50% of total time spent doing pt counseling and coordination of care

## 2022-04-09 ENCOUNTER — Encounter: Payer: Self-pay | Admitting: Internal Medicine

## 2022-04-09 ENCOUNTER — Other Ambulatory Visit: Payer: Self-pay | Admitting: Adult Health

## 2022-04-09 DIAGNOSIS — G40209 Localization-related (focal) (partial) symptomatic epilepsy and epileptic syndromes with complex partial seizures, not intractable, without status epilepticus: Secondary | ICD-10-CM

## 2022-04-09 MED ORDER — PHENOBARBITAL 64.8 MG PO TABS: 64.8000 mg | ORAL_TABLET | Freq: Every morning | ORAL | 5 refills | Status: DC

## 2022-04-15 ENCOUNTER — Telehealth: Payer: Self-pay | Admitting: Nurse Practitioner

## 2022-04-15 NOTE — Telephone Encounter (Signed)
Nurse cal; to report the patient has elevated Bp 200/73 then down to 185/66 after Valsartan, asymptomatic. Saw Dr. Lyndel Safe 03/27/22 her Bp 170/72, the patient declined medication adjustment for Bp. Saw Dr. Martinique 03/05/22 stated her blood pressure is controlled. The patient agreed to Hydralazine '10mg'$  bid for Bp control today, will continue to monitor Bp

## 2022-04-17 ENCOUNTER — Encounter: Payer: Self-pay | Admitting: Internal Medicine

## 2022-04-17 ENCOUNTER — Non-Acute Institutional Stay: Payer: Medicare Other | Admitting: Internal Medicine

## 2022-04-17 VITALS — BP 188/62 | HR 63 | Temp 98.1°F | Resp 18 | Ht 66.0 in | Wt 135.2 lb

## 2022-04-17 DIAGNOSIS — I480 Paroxysmal atrial fibrillation: Secondary | ICD-10-CM

## 2022-04-17 DIAGNOSIS — D509 Iron deficiency anemia, unspecified: Secondary | ICD-10-CM

## 2022-04-17 DIAGNOSIS — I10 Essential (primary) hypertension: Secondary | ICD-10-CM | POA: Diagnosis not present

## 2022-04-17 DIAGNOSIS — A498 Other bacterial infections of unspecified site: Secondary | ICD-10-CM | POA: Diagnosis not present

## 2022-04-17 DIAGNOSIS — G40209 Localization-related (focal) (partial) symptomatic epilepsy and epileptic syndromes with complex partial seizures, not intractable, without status epilepticus: Secondary | ICD-10-CM | POA: Diagnosis not present

## 2022-04-17 DIAGNOSIS — Q282 Arteriovenous malformation of cerebral vessels: Secondary | ICD-10-CM | POA: Diagnosis not present

## 2022-04-17 DIAGNOSIS — R600 Localized edema: Secondary | ICD-10-CM | POA: Diagnosis not present

## 2022-04-17 DIAGNOSIS — E782 Mixed hyperlipidemia: Secondary | ICD-10-CM | POA: Diagnosis not present

## 2022-04-17 NOTE — Progress Notes (Unsigned)
Location:  [place] of Service:  Clinic (12)  Provider:   Code Status: *** Goals of Care:     04/17/2022    4:35 PM  Advanced Directives  Does Patient Have a Medical Advance Directive? Yes  Type of Paramedic of [place];Living will;Out of facility DNR (pink MOST or yellow form)  Does patient want to make changes to medical advance directive? No - Patient declined  Copy of [place] in Chart? Yes - validated most recent copy scanned in chart (See row information)     Chief Complaint  Patient presents with   Medical Management of Chronic Issues    3 week follow up.   Immunizations    Discussed the need for tetanus, flu , tetanus, pneumonia and covid vaccine   Quality Metric Gaps    Discussed the need for AWV    HPI: Patient is a 86 y.o. female seen today for medical management of chronic diseases.     Past Medical History:  Diagnosis Date   Abnormal cardiovascular stress test    Carotid arterial disease (HCC)    MODERATE RIGHT   Cerebral AV malformation    DDD (degenerative disc disease)    SEVERE WITH SPINAL STENOSIS   Hypercholesterolemia    Hypertension    MVP (mitral valve prolapse)    MILD   Numbness    LEFT FOOT AND ANKLE   Osteopenia    Pseudo-gout     Past Surgical History:  Procedure Laterality Date   APPENDECTOMY     BREAST MASS EXCISION     CARPAL TUNNEL RELEASE     PACEMAKER IMPLANT N/A 11/07/2021   Procedure: PACEMAKER IMPLANT;  Surgeon: Evans Lance, MD;  Location: [facility];  Service: Cardiovascular;  Laterality: N/A;   REMOVAL OF PARATHYROID GLAND     STRESS NUCLEAR STUDY     ABNORMAL   TONSILLECTOMY      Allergies  Allergen Reactions   Aleve [Naproxen] Other (See Comments)   Azithromycin Other (See Comments)    Reaction not recalled   Penicillins Hives and Other (See Comments)    Bad reaction as a child after a shot of Penicillin   Pravastatin  Other (See Comments)    Reaction not recalled   Sulfa Antibiotics Other (See Comments)   Sulfa Drugs Cross Reactors Other (See Comments)    Reaction not recalled- stated she can take it "in small doses"   Tape Itching and Other (See Comments)    Cannot wear for an extended period of time   Naproxen Sodium Rash    Outpatient Encounter Medications as of 04/17/2022  Medication Sig   acetaminophen (TYLENOL) 500 MG tablet Take 500 mg by mouth every 6 (six) hours as needed for mild pain.   B Complex Vitamins (VITAMIN-B COMPLEX) TABS Take 1 tablet by mouth daily.   docusate sodium (COLACE) 100 MG capsule Take 100 mg by mouth daily.   fluticasone (FLONASE) 50 MCG/ACT nasal spray Place 1-2 sprays into both nostrils daily.   hydrALAZINE (APRESOLINE) 10 MG tablet Take 10 mg by mouth 2 (two) times daily.   loperamide (IMODIUM A-D) 2 MG tablet Take 1 tablet (2 mg total) by mouth as needed for diarrhea or loose stools.   Multiple Vitamins-Minerals (MULTI COMPLETE/IRON) TABS Take 1 tablet by mouth daily with breakfast. Folic acid included   nystatin powder Apply 1 Application topically 2 (two) times daily. Apply powder to reddened  areas BID.   ondansetron (ZOFRAN-ODT) 4 MG disintegrating tablet Take one tablet by mouth every 4 hours as needed.   PHENobarbital (LUMINAL) 64.8 MG tablet Take 1 tablet (64.8 mg total) by mouth in the morning.   saccharomyces boulardii (FLORASTOR) 250 MG capsule Take 250 mg by mouth 2 (two) times daily.   valsartan (DIOVAN) 160 MG tablet Take 1 tablet (160 mg total) by mouth daily.   vancomycin (VANCOCIN) 125 MG capsule Take 125 mg by mouth 4 (four) times daily.   [DISCONTINUED] nystatin powder Apply 1 Application topically 2 (two) times daily.   [DISCONTINUED] phenytoin (DILANTIN) 100 MG ER capsule Take 200 mg by mouth daily with breakfast.   [DISCONTINUED] Probiotic Product (ALIGN) 4 MG CAPS Take 1 capsule by mouth every morning. (Patient not taking: Reported on 04/05/2022)    [DISCONTINUED] SYSTANE ULTRA PF 0.4-0.3 % SOLN Place 1 drop into both eyes in the morning and at bedtime.   No facility-administered encounter medications on file as of 04/17/2022.    Review of Systems:  Review of Systems  Health Maintenance  Topic Date Due   TETANUS/TDAP  Never done   DEXA SCAN  Never done   Pneumonia Vaccine 74+ Years old (2 - PPSV23 or PCV20) 11/26/2015   COVID-19 Vaccine (6 - Moderna risk series) 06/23/2021   INFLUENZA VACCINE  01/09/2022   Medicare Annual Wellness (AWV)  03/15/2022   Zoster Vaccines- Shingrix  Completed   HPV VACCINES  Aged Out    Physical Exam: Vitals:   04/17/22 1632  BP: (!) 188/62  Pulse: 63  Resp: 18  Temp: 98.1 F (36.7 C)  TempSrc: Temporal  SpO2: 96%  Weight: 135 lb 3.2 oz (61.3 kg)  Height: '5\' 6"'$  (1.676 m)   Body mass index is 21.82 kg/m. Physical Exam  Labs reviewed: Basic Metabolic Panel: Recent Labs    10/17/21 0037 01/27/22 0830 01/27/22 1116 01/28/22 0331 03/29/22 0000 04/03/22 0000  NA 140 137  --  135 137 137  K 4.0 4.0  --  3.9 4.0 4.1  CL 109 103  --  102 100 99  CO2 24 25  --  24 27* 25*  GLUCOSE 103* 126*  --  95  --   --   BUN 28* 28*  --  27* 21 19  CREATININE 1.33* 1.29*  --  1.08* 0.9 0.9  CALCIUM 8.5* 9.4  --  9.0 8.8 8.8  MG 1.4*  --  1.7  --   --   --   TSH  --   --  1.914  --   --   --    Liver Function Tests: Recent Labs    10/15/21 1456 10/16/21 0110 01/27/22 0830 03/29/22 0000  AST 38 '29 22 27  '$ ALT 33 '26 12 17  '$ ALKPHOS 61 52 72 75  BILITOT 0.6 0.5 0.6  --   PROT 6.2* 5.5* 6.5  --   ALBUMIN 3.4* 3.1* 3.5 3.0*   Recent Labs    10/15/21 1456 01/27/22 0830  LIPASE 54* 53*   No results for input(s): "AMMONIA" in the last 8760 hours. CBC: Recent Labs    05/10/21 1113 06/21/21 1430 10/15/21 1456 10/16/21 0110 11/09/21 0000 01/27/22 0830 01/28/22 0331 03/29/22 0000 04/03/22 0000  WBC 5.3 7.8 5.6 5.7   < > 10.6* 6.5 5.8 6.9  NEUTROABS 3.8 5,686 4.2  --   --   --    --   --   --   HGB 9.5*  10.6* 9.1* 8.4*   < > 9.5* 8.4* 9.6* 10.3*  HCT 27.6* 30.9* 28.1* 26.0*   < > 28.9* 25.1* 28* 30*  MCV 97.9 96.9 104.9* 104.0*  --  101.4* 98.8  --   --   PLT 249.0 288 172 148*   < > 257 227 325 384   < > = values in this interval not displayed.   Lipid Panel: Recent Labs    01/28/22 1254  CHOL 208*  HDL 80  LDLCALC 116*  TRIG 59  CHOLHDL 2.6   No results found for: "HGBA1C"  Procedures since last visit: No results found.  Assessment/Plan There are no diagnoses linked to this encounter. Hydralazine 25 mg BID  Lasix 20 mg 3/week  Labs/tests ordered:  * No order type specified * Next appt:  04/30/2022

## 2022-04-22 ENCOUNTER — Encounter (HOSPITAL_COMMUNITY): Payer: Self-pay

## 2022-04-22 ENCOUNTER — Other Ambulatory Visit: Payer: Self-pay

## 2022-04-22 ENCOUNTER — Emergency Department (HOSPITAL_COMMUNITY): Payer: Medicare Other

## 2022-04-22 ENCOUNTER — Inpatient Hospital Stay (HOSPITAL_COMMUNITY)
Admission: EM | Admit: 2022-04-22 | Discharge: 2022-04-30 | DRG: 417 | Disposition: A | Discharge status: Skilled Nursing Facility | Payer: Medicare Other | Source: Skilled Nursing Facility | Attending: Internal Medicine | Admitting: Internal Medicine

## 2022-04-22 DIAGNOSIS — K8000 Calculus of gallbladder with acute cholecystitis without obstruction: Secondary | ICD-10-CM | POA: Diagnosis not present

## 2022-04-22 DIAGNOSIS — G629 Polyneuropathy, unspecified: Secondary | ICD-10-CM | POA: Diagnosis not present

## 2022-04-22 DIAGNOSIS — K82A1 Gangrene of gallbladder in cholecystitis: Secondary | ICD-10-CM | POA: Diagnosis not present

## 2022-04-22 DIAGNOSIS — R188 Other ascites: Secondary | ICD-10-CM | POA: Diagnosis present

## 2022-04-22 DIAGNOSIS — Z9049 Acquired absence of other specified parts of digestive tract: Secondary | ICD-10-CM | POA: Diagnosis not present

## 2022-04-22 DIAGNOSIS — I700 Atherosclerosis of aorta: Secondary | ICD-10-CM | POA: Diagnosis not present

## 2022-04-22 DIAGNOSIS — Z886 Allergy status to analgesic agent status: Secondary | ICD-10-CM

## 2022-04-22 DIAGNOSIS — K828 Other specified diseases of gallbladder: Secondary | ICD-10-CM | POA: Diagnosis not present

## 2022-04-22 DIAGNOSIS — Z823 Family history of stroke: Secondary | ICD-10-CM

## 2022-04-22 DIAGNOSIS — G319 Degenerative disease of nervous system, unspecified: Secondary | ICD-10-CM | POA: Diagnosis not present

## 2022-04-22 DIAGNOSIS — R110 Nausea: Secondary | ICD-10-CM | POA: Diagnosis not present

## 2022-04-22 DIAGNOSIS — K810 Acute cholecystitis: Secondary | ICD-10-CM

## 2022-04-22 DIAGNOSIS — Z8669 Personal history of other diseases of the nervous system and sense organs: Secondary | ICD-10-CM

## 2022-04-22 DIAGNOSIS — D509 Iron deficiency anemia, unspecified: Secondary | ICD-10-CM | POA: Diagnosis not present

## 2022-04-22 DIAGNOSIS — R197 Diarrhea, unspecified: Secondary | ICD-10-CM | POA: Diagnosis not present

## 2022-04-22 DIAGNOSIS — I341 Nonrheumatic mitral (valve) prolapse: Secondary | ICD-10-CM | POA: Diagnosis not present

## 2022-04-22 DIAGNOSIS — J90 Pleural effusion, not elsewhere classified: Secondary | ICD-10-CM | POA: Diagnosis not present

## 2022-04-22 DIAGNOSIS — R404 Transient alteration of awareness: Secondary | ICD-10-CM | POA: Diagnosis not present

## 2022-04-22 DIAGNOSIS — K429 Umbilical hernia without obstruction or gangrene: Secondary | ICD-10-CM | POA: Diagnosis present

## 2022-04-22 DIAGNOSIS — E7800 Pure hypercholesterolemia, unspecified: Secondary | ICD-10-CM | POA: Diagnosis not present

## 2022-04-22 DIAGNOSIS — Z880 Allergy status to penicillin: Secondary | ICD-10-CM | POA: Diagnosis not present

## 2022-04-22 DIAGNOSIS — G9389 Other specified disorders of brain: Secondary | ICD-10-CM | POA: Diagnosis not present

## 2022-04-22 DIAGNOSIS — K8012 Calculus of gallbladder with acute and chronic cholecystitis without obstruction: Principal | ICD-10-CM | POA: Diagnosis present

## 2022-04-22 DIAGNOSIS — R739 Hyperglycemia, unspecified: Secondary | ICD-10-CM | POA: Diagnosis present

## 2022-04-22 DIAGNOSIS — Z882 Allergy status to sulfonamides status: Secondary | ICD-10-CM

## 2022-04-22 DIAGNOSIS — I10 Essential (primary) hypertension: Secondary | ICD-10-CM | POA: Diagnosis present

## 2022-04-22 DIAGNOSIS — I4821 Permanent atrial fibrillation: Secondary | ICD-10-CM | POA: Diagnosis not present

## 2022-04-22 DIAGNOSIS — Z801 Family history of malignant neoplasm of trachea, bronchus and lung: Secondary | ICD-10-CM | POA: Diagnosis not present

## 2022-04-22 DIAGNOSIS — K8010 Calculus of gallbladder with chronic cholecystitis without obstruction: Secondary | ICD-10-CM | POA: Diagnosis present

## 2022-04-22 DIAGNOSIS — R410 Disorientation, unspecified: Secondary | ICD-10-CM | POA: Diagnosis not present

## 2022-04-22 DIAGNOSIS — R112 Nausea with vomiting, unspecified: Secondary | ICD-10-CM | POA: Diagnosis not present

## 2022-04-22 DIAGNOSIS — Z7401 Bed confinement status: Secondary | ICD-10-CM | POA: Diagnosis not present

## 2022-04-22 DIAGNOSIS — R1111 Vomiting without nausea: Secondary | ICD-10-CM | POA: Diagnosis not present

## 2022-04-22 DIAGNOSIS — I480 Paroxysmal atrial fibrillation: Secondary | ICD-10-CM | POA: Diagnosis not present

## 2022-04-22 DIAGNOSIS — G9341 Metabolic encephalopathy: Secondary | ICD-10-CM | POA: Diagnosis not present

## 2022-04-22 DIAGNOSIS — Z881 Allergy status to other antibiotic agents status: Secondary | ICD-10-CM | POA: Diagnosis not present

## 2022-04-22 DIAGNOSIS — K8066 Calculus of gallbladder and bile duct with acute and chronic cholecystitis without obstruction: Secondary | ICD-10-CM | POA: Diagnosis not present

## 2022-04-22 DIAGNOSIS — Z950 Presence of cardiac pacemaker: Secondary | ICD-10-CM | POA: Diagnosis not present

## 2022-04-22 DIAGNOSIS — D539 Nutritional anemia, unspecified: Secondary | ICD-10-CM | POA: Diagnosis not present

## 2022-04-22 DIAGNOSIS — Z66 Do not resuscitate: Secondary | ICD-10-CM | POA: Diagnosis present

## 2022-04-22 DIAGNOSIS — R1011 Right upper quadrant pain: Secondary | ICD-10-CM | POA: Diagnosis not present

## 2022-04-22 DIAGNOSIS — G40209 Localization-related (focal) (partial) symptomatic epilepsy and epileptic syndromes with complex partial seizures, not intractable, without status epilepticus: Secondary | ICD-10-CM | POA: Diagnosis not present

## 2022-04-22 DIAGNOSIS — R109 Unspecified abdominal pain: Principal | ICD-10-CM

## 2022-04-22 DIAGNOSIS — E782 Mixed hyperlipidemia: Secondary | ICD-10-CM | POA: Diagnosis not present

## 2022-04-22 DIAGNOSIS — R600 Localized edema: Secondary | ICD-10-CM | POA: Diagnosis not present

## 2022-04-22 DIAGNOSIS — T68XXXA Hypothermia, initial encounter: Secondary | ICD-10-CM | POA: Diagnosis not present

## 2022-04-22 DIAGNOSIS — R4701 Aphasia: Secondary | ICD-10-CM | POA: Diagnosis not present

## 2022-04-22 DIAGNOSIS — D649 Anemia, unspecified: Secondary | ICD-10-CM | POA: Diagnosis not present

## 2022-04-22 DIAGNOSIS — R569 Unspecified convulsions: Secondary | ICD-10-CM | POA: Diagnosis not present

## 2022-04-22 DIAGNOSIS — I959 Hypotension, unspecified: Secondary | ICD-10-CM | POA: Diagnosis not present

## 2022-04-22 DIAGNOSIS — G40919 Epilepsy, unspecified, intractable, without status epilepticus: Secondary | ICD-10-CM | POA: Diagnosis not present

## 2022-04-22 DIAGNOSIS — R531 Weakness: Secondary | ICD-10-CM | POA: Diagnosis not present

## 2022-04-22 DIAGNOSIS — I442 Atrioventricular block, complete: Secondary | ICD-10-CM | POA: Diagnosis present

## 2022-04-22 DIAGNOSIS — Z87891 Personal history of nicotine dependence: Secondary | ICD-10-CM

## 2022-04-22 DIAGNOSIS — R4182 Altered mental status, unspecified: Secondary | ICD-10-CM | POA: Diagnosis not present

## 2022-04-22 HISTORY — DX: Presence of cardiac pacemaker: Z95.0

## 2022-04-22 LAB — MAGNESIUM: Magnesium: 1.1 mg/dL — ABNORMAL LOW (ref 1.7–2.4)

## 2022-04-22 LAB — COMPREHENSIVE METABOLIC PANEL
ALT: 18 U/L (ref 0–44)
AST: 29 U/L (ref 15–41)
Albumin: 3.7 g/dL (ref 3.5–5.0)
Alkaline Phosphatase: 68 U/L (ref 38–126)
Anion gap: 9 (ref 5–15)
BUN: 25 mg/dL — ABNORMAL HIGH (ref 8–23)
CO2: 27 mmol/L (ref 22–32)
Calcium: 8.2 mg/dL — ABNORMAL LOW (ref 8.9–10.3)
Chloride: 105 mmol/L (ref 98–111)
Creatinine, Ser: 1.05 mg/dL — ABNORMAL HIGH (ref 0.44–1.00)
GFR, Estimated: 52 mL/min — ABNORMAL LOW (ref >60)
Glucose, Bld: 140 mg/dL — ABNORMAL HIGH (ref 70–99)
Potassium: 3.7 mmol/L (ref 3.5–5.1)
Sodium: 141 mmol/L (ref 135–145)
Total Bilirubin: 0.6 mg/dL (ref 0.3–1.2)
Total Protein: 7 g/dL (ref 6.5–8.1)

## 2022-04-22 LAB — URINALYSIS, ROUTINE W REFLEX MICROSCOPIC
Bilirubin Urine: NEGATIVE
Glucose, UA: NEGATIVE mg/dL
Hgb urine dipstick: NEGATIVE
Ketones, ur: NEGATIVE mg/dL
Leukocytes,Ua: NEGATIVE
Nitrite: NEGATIVE
Protein, ur: 100 mg/dL — AB
Specific Gravity, Urine: 1.024 (ref 1.005–1.030)
pH: 6 (ref 5.0–8.0)

## 2022-04-22 LAB — HEMOGLOBIN A1C
Hgb A1c MFr Bld: 4.9 % (ref 4.8–5.6)
Mean Plasma Glucose: 93.93 mg/dL

## 2022-04-22 LAB — CBC WITH DIFFERENTIAL/PLATELET
Abs Immature Granulocytes: 0.04 10*3/uL (ref 0.00–0.07)
Basophils Absolute: 0 10*3/uL (ref 0.0–0.1)
Basophils Relative: 0 %
Eosinophils Absolute: 0 10*3/uL (ref 0.0–0.5)
Eosinophils Relative: 0 %
HCT: 31.7 % — ABNORMAL LOW (ref 36.0–46.0)
Hemoglobin: 10.2 g/dL — ABNORMAL LOW (ref 12.0–15.0)
Immature Granulocytes: 0 %
Lymphocytes Relative: 3 %
Lymphs Abs: 0.4 10*3/uL — ABNORMAL LOW (ref 0.7–4.0)
MCH: 33.6 pg (ref 26.0–34.0)
MCHC: 32.2 g/dL (ref 30.0–36.0)
MCV: 104.3 fL — ABNORMAL HIGH (ref 80.0–100.0)
Monocytes Absolute: 0.9 10*3/uL (ref 0.1–1.0)
Monocytes Relative: 7 %
Neutro Abs: 11.9 10*3/uL — ABNORMAL HIGH (ref 1.7–7.7)
Neutrophils Relative %: 90 %
Platelets: 288 10*3/uL (ref 150–400)
RBC: 3.04 MIL/uL — ABNORMAL LOW (ref 3.87–5.11)
RDW: 16 % — ABNORMAL HIGH (ref 11.5–15.5)
WBC: 13.3 10*3/uL — ABNORMAL HIGH (ref 4.0–10.5)
nRBC: 0 % (ref 0.0–0.2)

## 2022-04-22 LAB — LIPASE, BLOOD: Lipase: 42 U/L (ref 11–51)

## 2022-04-22 LAB — VITAMIN B12: Vitamin B-12: 443 pg/mL (ref 180–914)

## 2022-04-22 LAB — FOLATE: Folate: 38 ng/mL (ref >5.9)

## 2022-04-22 MED ORDER — HYDRALAZINE HCL 25 MG PO TABS
25.0000 mg | ORAL_TABLET | Freq: Two times a day (BID) | ORAL | Status: DC
  Administered 2022-04-22 – 2022-04-30 (×12): 25 mg via ORAL
  Filled 2022-04-22 (×13): qty 1

## 2022-04-22 MED ORDER — PHENOBARBITAL 32.4 MG PO TABS
64.8000 mg | ORAL_TABLET | Freq: Every morning | ORAL | Status: DC
  Administered 2022-04-22 – 2022-04-24 (×3): 64.8 mg via ORAL
  Filled 2022-04-22 (×4): qty 2

## 2022-04-22 MED ORDER — PROCHLORPERAZINE EDISYLATE 10 MG/2ML IJ SOLN
10.0000 mg | Freq: Four times a day (QID) | INTRAMUSCULAR | Status: DC | PRN
  Administered 2022-04-22: 10 mg via INTRAVENOUS
  Filled 2022-04-22: qty 2

## 2022-04-22 MED ORDER — IRBESARTAN 150 MG PO TABS
150.0000 mg | ORAL_TABLET | Freq: Every day | ORAL | Status: DC
  Administered 2022-04-22 – 2022-04-30 (×7): 150 mg via ORAL
  Filled 2022-04-22 (×7): qty 1

## 2022-04-22 MED ORDER — IOHEXOL 9 MG/ML PO SOLN
ORAL | Status: AC
  Filled 2022-04-22: qty 1000

## 2022-04-22 MED ORDER — CALCIUM GLUCONATE-NACL 1-0.675 GM/50ML-% IV SOLN
1.0000 g | Freq: Once | INTRAVENOUS | Status: AC
  Administered 2022-04-22: 1000 mg via INTRAVENOUS
  Filled 2022-04-22: qty 50

## 2022-04-22 MED ORDER — DIPHENHYDRAMINE HCL 50 MG/ML IJ SOLN: 12.5000 mg | Freq: Four times a day (QID) | INTRAMUSCULAR | Status: DC | PRN

## 2022-04-22 MED ORDER — PHENYTOIN SODIUM EXTENDED 100 MG PO CAPS
200.0000 mg | ORAL_CAPSULE | Freq: Every day | ORAL | Status: DC
  Administered 2022-04-23 – 2022-04-24 (×2): 200 mg via ORAL
  Filled 2022-04-22 (×2): qty 2

## 2022-04-22 MED ORDER — SODIUM CHLORIDE 0.9 % IV SOLN
2.0000 g | Freq: Once | INTRAVENOUS | Status: AC
  Administered 2022-04-22: 2 g via INTRAVENOUS
  Filled 2022-04-22: qty 12.5

## 2022-04-22 MED ORDER — METRONIDAZOLE 500 MG/100ML IV SOLN
500.0000 mg | Freq: Two times a day (BID) | INTRAVENOUS | Status: DC
  Administered 2022-04-22 – 2022-04-25 (×6): 500 mg via INTRAVENOUS
  Filled 2022-04-22 (×6): qty 100

## 2022-04-22 MED ORDER — MORPHINE SULFATE (PF) 2 MG/ML IV SOLN
2.0000 mg | INTRAVENOUS | Status: DC | PRN
  Administered 2022-04-22: 2 mg via INTRAVENOUS
  Filled 2022-04-22: qty 1

## 2022-04-22 MED ORDER — METOPROLOL TARTRATE 5 MG/5ML IV SOLN
5.0000 mg | Freq: Four times a day (QID) | INTRAVENOUS | Status: DC | PRN
  Administered 2022-04-22: 5 mg via INTRAVENOUS
  Filled 2022-04-22 (×2): qty 5

## 2022-04-22 MED ORDER — LACTATED RINGERS IV SOLN: INTRAVENOUS | Status: DC

## 2022-04-22 MED ORDER — SODIUM CHLORIDE 0.9 % IV SOLN
2.0000 g | Freq: Two times a day (BID) | INTRAVENOUS | Status: DC
  Administered 2022-04-22 – 2022-04-24 (×5): 2 g via INTRAVENOUS
  Filled 2022-04-22 (×5): qty 12.5

## 2022-04-22 MED ORDER — IOHEXOL 300 MG/ML  SOLN
100.0000 mL | Freq: Once | INTRAMUSCULAR | Status: AC | PRN
  Administered 2022-04-22: 100 mL via INTRAVENOUS

## 2022-04-22 MED ORDER — ENOXAPARIN SODIUM 40 MG/0.4ML IJ SOSY
40.0000 mg | PREFILLED_SYRINGE | INTRAMUSCULAR | Status: DC
  Administered 2022-04-22: 40 mg via SUBCUTANEOUS
  Filled 2022-04-22: qty 0.4

## 2022-04-22 MED ORDER — METRONIDAZOLE 500 MG/100ML IV SOLN
500.0000 mg | Freq: Once | INTRAVENOUS | Status: AC
  Administered 2022-04-22: 500 mg via INTRAVENOUS
  Filled 2022-04-22: qty 100

## 2022-04-22 NOTE — Consult Note (Addendum)
Reason for Consult:gallstones Referring Physician: Marylyn Ishihara MD  [name] is an 86 y.o. female.  HPI: Pt presents to ED with abdominal pain RUQ overnight , severe sharp no radiation CT shows signs of acute cholecystitis  Pt states first episode   Past Medical History:  Diagnosis Date   Abnormal cardiovascular stress test    Carotid arterial disease (HCC)    MODERATE RIGHT   Cerebral AV malformation    DDD (degenerative disc disease)    SEVERE WITH SPINAL STENOSIS   Hypercholesterolemia    Hypertension    MVP (mitral valve prolapse)    MILD   Numbness    LEFT FOOT AND ANKLE   Osteopenia    Pseudo-gout     Past Surgical History:  Procedure Laterality Date   APPENDECTOMY     BREAST MASS EXCISION     CARPAL TUNNEL RELEASE     PACEMAKER IMPLANT N/A 11/07/2021   Procedure: PACEMAKER IMPLANT;  Surgeon: Evans Lance, MD;  Location: [facility];  Service: Cardiovascular;  Laterality: N/A;   REMOVAL OF PARATHYROID GLAND     STRESS NUCLEAR STUDY     ABNORMAL   TONSILLECTOMY      Family History  Problem Relation Age of Onset   Stroke Mother    Lung cancer Father    Breast cancer Neg Hx     Social History:  reports that she quit smoking about 53 years ago. Her smoking use included cigarettes. She has a 14.00 pack-year smoking history. She has never used smokeless tobacco. She reports that she does not drink alcohol and does not use drugs.  Allergies:  Allergies  Allergen Reactions   Aleve [Naproxen] Other (See Comments)   Azithromycin Other (See Comments)    Reaction not recalled   Penicillins Hives and Other (See Comments)    Bad reaction as a child after a shot of Penicillin   Pravastatin Other (See Comments)    Reaction not recalled   Sulfa Antibiotics Other (See Comments)   Sulfa Drugs Cross Reactors Other (See Comments)    Reaction not recalled- stated she can take it "in small doses"   Tape Itching and Other (See Comments)    Cannot wear for an  extended period of time   Naproxen Sodium Rash    Medications: I have reviewed the patient's current medications.  Results for orders placed or performed during the hospital encounter of 04/22/22 (from the past 48 hour(s))  CBC with Differential     Status: Abnormal   Collection Time: 04/22/22  4:34 AM  Result Value Ref Range   WBC 13.3 (H) 4.0 - 10.5 K/uL   RBC 3.04 (L) 3.87 - 5.11 MIL/uL   Hemoglobin 10.2 (L) 12.0 - 15.0 g/dL   HCT 31.7 (L) 36.0 - 46.0 %   MCV 104.3 (H) 80.0 - 100.0 fL   MCH 33.6 26.0 - 34.0 pg   MCHC 32.2 30.0 - 36.0 g/dL   RDW 16.0 (H) 11.5 - 15.5 %   Platelets 288 150 - 400 K/uL   nRBC 0.0 0.0 - 0.2 %   Neutrophils Relative % 90 %   Neutro Abs 11.9 (H) 1.7 - 7.7 K/uL   Lymphocytes Relative 3 %   Lymphs Abs 0.4 (L) 0.7 - 4.0 K/uL   Monocytes Relative 7 %   Monocytes Absolute 0.9 0.1 - 1.0 K/uL   Eosinophils Relative 0 %   Eosinophils Absolute 0.0 0.0 - 0.5 K/uL   Basophils Relative 0 %  Basophils Absolute 0.0 0.0 - 0.1 K/uL   Immature Granulocytes 0 %   Abs Immature Granulocytes 0.04 0.00 - 0.07 K/uL    Comment: Performed at [facility], [place] [address], [place], [place] [address]  Comprehensive metabolic panel     Status: Abnormal   Collection Time: 04/22/22  4:34 AM  Result Value Ref Range   Sodium 141 135 - 145 mmol/L   Potassium 3.7 3.5 - 5.1 mmol/L   Chloride 105 98 - 111 mmol/L   CO2 27 22 - 32 mmol/L   Glucose, Bld 140 (H) 70 - 99 mg/dL    Comment: Glucose reference range applies only to samples taken after fasting for at least 8 hours.   BUN 25 (H) 8 - 23 mg/dL   Creatinine, Ser 1.05 (H) 0.44 - 1.00 mg/dL   Calcium 8.2 (L) 8.9 - 10.3 mg/dL   Total Protein 7.0 6.5 - 8.1 g/dL   Albumin 3.7 3.5 - 5.0 g/dL   AST 29 15 - 41 U/L   ALT 18 0 - 44 U/L   Alkaline Phosphatase 68 38 - 126 U/L   Total Bilirubin 0.6 0.3 - 1.2 mg/dL   GFR, Estimated 52 (L) >60 mL/min    Comment: (NOTE) Calculated using the CKD-EPI Creatinine  Equation (2021)    Anion gap 9 5 - 15    Comment: Performed at [facility], [place] [address] [place], [place] [address]  Lipase, blood     Status: None   Collection Time: 04/22/22  4:34 AM  Result Value Ref Range   Lipase 42 11 - 51 U/L    Comment: Performed at [facility], [place] [address] [place], [place] [address]  Urinalysis, Routine w reflex microscopic Urine, Clean Catch     Status: Abnormal   Collection Time: 04/22/22  7:22 AM  Result Value Ref Range   Color, Urine YELLOW YELLOW   APPearance CLEAR CLEAR   Specific Gravity, Urine 1.024 1.005 - 1.030   pH 6.0 5.0 - 8.0   Glucose, UA NEGATIVE NEGATIVE mg/dL   Hgb urine dipstick NEGATIVE NEGATIVE   Bilirubin Urine NEGATIVE NEGATIVE   Ketones, ur NEGATIVE NEGATIVE mg/dL   Protein, ur 100 (A) NEGATIVE mg/dL   Nitrite NEGATIVE NEGATIVE   Leukocytes,Ua NEGATIVE NEGATIVE   RBC / HPF 0-5 0 - 5 RBC/hpf   WBC, UA 0-5 0 - 5 WBC/hpf   Bacteria, UA RARE (A) NONE SEEN   Mucus PRESENT    Hyaline Casts, UA PRESENT     Comment: Performed at [facility], [place] [address] [place], [place] [address]    US Abdomen Limited RUQ (LIVER/GB)  Result Date: 04/22/2022 CLINICAL DATA:  Pain right upper quadrant EXAM: ULTRASOUND ABDOMEN LIMITED RIGHT UPPER QUADRANT COMPARISON:  CT done earlier today FINDINGS: Gallbladder: There is 2 cm calculus in the lumen of gallbladder. There are possible small polyps in the margin of lumen. There is wall thickening in gallbladder measuring up to 11 mm. There is trace amount of pericholecystic fluid. Technologist did not observe any tenderness over the gallbladder during the study. Common bile duct: Diameter: 5 mm Liver: There is nodularity in liver surface. There is slightly increased echogenicity in liver. There is small amount of perihepatic ascites. Portal vein is patent on color Doppler imaging with normal direction of blood flow towards the liver. Other: Right  pleural effusion is seen. IMPRESSION: Large gallbladder stone and possible small gallbladder polyps. There is diffuse  wall thickening in gallbladder which may be due to acute or chronic cholecystitis. Technologist did not observe any tenderness over the gallbladder during the study. Please correlate with clinical physical examination findings and laboratory findings. There is mild nodularity in liver surface suggesting possible cirrhosis. Small perihepatic ascites. Right pleural effusion. Electronically Signed   By: Elmer Picker M.D.   On: 04/22/2022 08:54   CT ABDOMEN PELVIS W CONTRAST  Addendum Date: 04/22/2022   ADDENDUM REPORT: 04/22/2022 07:16 ADDENDUM: As mentioned in the body of the report there is a 2.3 x 1.5 cm left adrenal nodule which is indeterminate. Attention at time of forthcoming abdominal MRI is recommended for further characterization. Electronically Signed   By: Vinnie Langton M.D.   On: 04/22/2022 07:16   Result Date: 04/22/2022 CLINICAL DATA:  86 year old female with history of right-sided abdominal tenderness and nausea. EXAM: CT ABDOMEN AND PELVIS WITH CONTRAST TECHNIQUE: Multidetector CT imaging of the abdomen and pelvis was performed using the standard protocol following bolus administration of intravenous contrast. RADIATION DOSE REDUCTION: This exam was performed according to the departmental dose-optimization program which includes automated exposure control, adjustment of the mA and/or kV according to patient size and/or use of iterative reconstruction technique. CONTRAST:  11m OMNIPAQUE IOHEXOL 300 MG/ML  SOLN COMPARISON:  CT of the abdomen and pelvis 04/17/2021. FINDINGS: Lower chest: Bilateral pleural effusions with extensive passive subsegmental atelectasis in the visualized lung bases. Cardiomegaly. Pacemaker leads noted in the right-side of the heart. Hepatobiliary: No suspicious cystic or solid hepatic lesions. No intra or extrahepatic biliary ductal dilatation.  2.4 x 1.5 cm calcified gallstone lying dependently in the gallbladder. Gallbladder is moderately distended. Gallbladder wall is thickened diffusely with heterogeneous areas of enhancement. Trace volume of pericholecystic fluid. Pancreas: No pancreatic mass. No pancreatic ductal dilatation. No pancreatic or peripancreatic fluid collections or inflammatory changes. Spleen: Subcentimeter hypovascular lesion in the anterior aspect of the spleen, too small to characterize, but similar to the prior study from 2022, presumably benign (no imaging follow-up recommended). Adrenals/Urinary Tract: Low-attenuation lesions in both kidneys, compatible with simple cysts, largest of which is exophytic in the posteromedial aspect of the upper pole of the left kidney measuring 2.7 cm in diameter (no imaging follow-up recommended). Multifocal cortical thinning in both kidneys. No suspicious renal lesions. No hydroureteronephrosis. Urinary bladder is normal in appearance. Right adrenal gland is normal in appearance. 2.3 x 1.5 cm left adrenal nodule (axial image 22 of series 2). Stomach/Bowel: The stomach is nearly completely decompressed, but otherwise unremarkable in appearance. No pathologic dilatation of small bowel or colon. Normal appendix. Vascular/Lymphatic: Atherosclerosis throughout the abdominal aorta and pelvic vasculature, without evidence of aneurysm or dissection in the abdominal or pelvic vasculature. No lymphadenopathy noted in the abdomen or pelvis. Reproductive: Uterus and ovaries are atrophic. Other: Supraumbilical ventral hernia containing some omental fat and a small volume of ascites. No pneumoperitoneum. Small volume of ascites. Musculoskeletal: There are no aggressive appearing lytic or blastic lesions noted in the visualized portions of the skeleton. IMPRESSION: 1. Gallbladder is distended with extensive mucosal hyperenhancement, mural thickening and trace amount of pericholecystic fluid. There is a large  calcified gallstone within the lumen of the gallbladder, however, this does not appear to be in an obstructive position at this time. Overall, findings are clearly abnormal and concerning for potential acute cholecystitis, but correlation with abdominal MRI with and without IV gadolinium with MRCP is recommended to better evaluate these findings and assess for potential choledocholithiasis. Surgical consultation is also recommended.  2. Small volume of ascites. 3. Supraumbilical ventral hernia containing omental fat and small volume of ascites. No definite bowel incarceration or obstruction at this time. 4. Bilateral pleural effusions with passive areas of subsegmental atelectasis in the visualized lung bases. 5. Cardiomegaly. 6. Aortic atherosclerosis. Electronically Signed: By: Vinnie Langton M.D. On: 04/22/2022 07:09    Review of Systems  Gastrointestinal:  Positive for abdominal pain. Negative for nausea and vomiting.  All other systems reviewed and are negative.  Blood pressure (!) 151/140, pulse 90, temperature 97.9 F (36.6 C), temperature source Oral, resp. rate 16, height '5\' 6"'$  (1.676 m), weight 62.1 kg, SpO2 95 %. Physical Exam HENT:     Head: Normocephalic.  Cardiovascular:     Rate and Rhythm: Normal rate.  Abdominal:     Tenderness: There is abdominal tenderness in the right upper quadrant.  Skin:    General: Skin is warm and dry.  Neurological:     General: No focal deficit present.     Mental Status: She is alert.  Psychiatric:        Mood and Affect: Mood normal.   Supraumbilical hernia noted no redness not reducible not tender - only fat on CT   Assessment/Plan: Acute cholecystitis  Needs medical clearance/ tune up Plan LGB in next 24 - 48 hours if able Check U/S  OK to have clears for now   Turner Daniels MD  04/22/2022, 11:21 AM     TOTAL TIME 45 MINUTES

## 2022-04-22 NOTE — ED Provider Notes (Signed)
[facility] DEPT Provider Note   CSN: [id number] Arrival date & time: 04/22/22  6712     History  Chief Complaint  Patient presents with   Abdominal Pain    [name] is a 86 y.o. female.  86 year old female presents to the emergency department from [facility] complaining of right-sided abdominal pain.  She notes that pain began at 1400 and has been constant.  She subjectively felt a "bulge" around the site of her discomfort, though she cannot locate it now.  She has had some associated nausea without vomiting.  Has not taken any medication for her symptoms.  No known sick contacts.  Abdominal surgical history significant for appendectomy.  Last bowel movement at 0310.  The history is provided by the patient and the EMS personnel. No language interpreter was used.  Abdominal Pain      Home Medications Prior to Admission medications   Medication Sig Start Date End Date Taking? Authorizing Provider  acetaminophen (TYLENOL) 500 MG tablet Take 500 mg by mouth every 6 (six) hours as needed for mild pain.    [provider]  B Complex Vitamins (VITAMIN-B COMPLEX) TABS Take 1 tablet by mouth daily.    [provider]  docusate sodium (COLACE) 100 MG capsule Take 100 mg by mouth daily.    [provider]  fluticasone (FLONASE) 50 MCG/ACT nasal spray Place 1-2 sprays into both nostrils daily.    [provider]  furosemide (LASIX) 20 MG tablet Take 20 mg by mouth 3 (three) times a week.    [provider]  hydrALAZINE (APRESOLINE) 10 MG tablet Take 25 mg by mouth 2 (two) times daily. 04/15/22   [provider]  loperamide (IMODIUM A-D) 2 MG tablet Take 1 tablet (2 mg total) by mouth as needed for diarrhea or loose stools. 06/21/21   Ngetich, Dinah C, NP  Multiple Vitamins-Minerals (MULTI COMPLETE/IRON) TABS Take 1 tablet by mouth daily with breakfast. Folic acid included    [provider]   nystatin powder Apply 1 Application topically 2 (two) times daily. Apply powder to reddened areas BID.    [provider]  ondansetron (ZOFRAN-ODT) 4 MG disintegrating tablet Take one tablet by mouth every 4 hours as needed. 02/20/22   [provider]  PHENobarbital (LUMINAL) 64.8 MG tablet Take 1 tablet (64.8 mg total) by mouth in the morning. 04/09/22   Royal Hawthorn, NP  potassium chloride SA (KLOR-CON M) 20 MEQ tablet Take 20 mEq by mouth 3 (three) times a week.    [provider]  saccharomyces boulardii (FLORASTOR) 250 MG capsule Take 250 mg by mouth 2 (two) times daily.    [provider]  valsartan (DIOVAN) 160 MG tablet Take 1 tablet (160 mg total) by mouth daily. 12/11/21   Lenna Sciara, NP      Allergies    Aleve [naproxen], Azithromycin, Penicillins, Pravastatin, Sulfa antibiotics, Sulfa drugs cross reactors, Tape, and Naproxen sodium    Review of Systems   Review of Systems  Gastrointestinal:  Positive for abdominal pain.  Ten systems reviewed and are negative for acute change, except as noted in the HPI.    Physical Exam Updated Vital Signs BP (!) 171/47 (BP Location: Right Arm)   Pulse 79   Temp 98 F (36.7 C) (Oral)   Resp 17   Ht '5\' 6"'$  (1.676 m)   Wt 62.1 kg   SpO2 95%   BMI 22.11 kg/m   Physical Exam  Vitals and nursing note reviewed.  Constitutional:      General: She is not in acute distress.    Appearance: She is well-developed. She is not diaphoretic.     Comments: Nontoxic-appearing and in no acute distress  HENT:     Head: Normocephalic and atraumatic.  Eyes:     General: No scleral icterus.    Conjunctiva/sclera: Conjunctivae normal.  Pulmonary:     Effort: Pulmonary effort is normal. No respiratory distress.  Abdominal:     Comments: Abdomen soft, minimally distended.  Tenderness noted in the right mid and upper abdomen.  Negative Murphy's sign.  Musculoskeletal:        General: Normal range of motion.      Cervical back: Normal range of motion.  Skin:    General: Skin is warm and dry.     Coloration: Skin is not pale.     Findings: No erythema or rash.  Neurological:     Mental Status: She is alert and oriented to person, place, and time.     Coordination: Coordination normal.  Psychiatric:        Behavior: Behavior normal.     ED Results / Procedures / Treatments   Labs (all labs ordered are listed, but only abnormal results are displayed) Labs Reviewed  CBC WITH DIFFERENTIAL/PLATELET - Abnormal; Notable for the following components:      Result Value   WBC 13.3 (*)    RBC 3.04 (*)    Hemoglobin 10.2 (*)    HCT 31.7 (*)    MCV 104.3 (*)    RDW 16.0 (*)    Neutro Abs 11.9 (*)    Lymphs Abs 0.4 (*)    All other components within normal limits  COMPREHENSIVE METABOLIC PANEL - Abnormal; Notable for the following components:   Glucose, Bld 140 (*)    BUN 25 (*)    Creatinine, Ser 1.05 (*)    Calcium 8.2 (*)    GFR, Estimated 52 (*)    All other components within normal limits  LIPASE, BLOOD  URINALYSIS, ROUTINE W REFLEX MICROSCOPIC    EKG None  Radiology No results found.  Procedures Procedures    Medications Ordered in ED Medications  iohexol (OMNIPAQUE) 9 MG/ML oral solution (  Contrast Given 04/22/22 0437)    ED Course/ Medical Decision Making/ A&P Clinical Course as of 04/22/22 0503  Sun Apr 22, 2022  0400 Declines pain medication [KH]    Clinical Course User Index [KH] Antonietta Breach, PA-C                           Medical Decision Making Amount and/or Complexity of Data Reviewed Labs: ordered. Radiology: ordered.  Risk Prescription drug management.   This patient presents to the ED for concern of R sided abdominal pain, this involves an extensive number of treatment options, and is a complaint that carries with it a high risk of complications and morbidity.  The differential diagnosis includes cholecystitis vs choledocholithiasis vs biliary colic  vs enteritis/colitis vs pyelonephritis vs ileus   Co morbidities that complicate the patient evaluation  HTN HLD CAD   Additional history obtained:  Additional history obtained from EMS   Lab Tests:  I Ordered, and personally interpreted labs.  The pertinent results include: Leukocytosis of 13.3 with left shift.  Anemia of 10.2 is stable, chronic.   Imaging Studies ordered:  I ordered imaging studies including CT abdomen pelvis with contrast  Results for this study are currently pending   Cardiac Monitoring:  The patient was maintained on a cardiac monitor.  I personally viewed and interpreted the cardiac monitored which showed an underlying rhythm of: Normal sinus rhythm   Medicines ordered and prescription drug management:  I have reviewed the patients home medicines and have made adjustments as needed Medications for pain offered to the patient, but she declined   Test Considered:  RUQ [place]   Reevaluation:  After the interventions noted above, I reevaluated the patient and found that they have :stayed the same   Social Determinants of Health:  Insured patient    Dispostion:  Care signed out to [place], [place] at shift change who will follow up on CT scan. Disposition per imaging results.         Final Clinical Impression(s) / ED Diagnoses Final diagnoses:  Abdominal pain, unspecified abdominal location    Rx / DC Orders ED Discharge Orders     None         Antonietta Breach, PA-C 04/22/22 [place], [place], MD 04/22/22 2019

## 2022-04-22 NOTE — ED Triage Notes (Signed)
Pt BIB [facility] from [facility]. Pt c/o right sided abdominal tenderness and nausea. Facility staff reports feeling deformity in RLQ that went away after a bowel movement. Hx left sided hernia.  186/66 88 HR 98% RA 156 CBG  Pt hx appendectomy.  Last bowel movement 0310

## 2022-04-22 NOTE — ED Provider Notes (Signed)
Physical Exam  BP (!) 166/45   Pulse (!) 52   Temp 97.9 F (36.6 C) (Oral)   Resp 16   Ht '5\' 6"'$  (1.676 m)   Wt 62.1 kg   SpO2 95%   BMI 22.11 kg/m   Physical Exam Vitals and nursing note reviewed.  Constitutional:      General: She is not in acute distress.    Appearance: She is not toxic-appearing.  HENT:     Head: Normocephalic and atraumatic.  Eyes:     General: No scleral icterus. Cardiovascular:     Rate and Rhythm: Normal rate.     Heart sounds: Murmur heard.  Pulmonary:     Effort: Pulmonary effort is normal. No respiratory distress.  Abdominal:     Palpations: Abdomen is soft.     Tenderness: There is abdominal tenderness.  Skin:    Capillary Refill: Capillary refill takes less than 2 seconds.     Coloration: Skin is not cyanotic, jaundiced or pale.  Neurological:     Mental Status: She is alert and oriented to person, place, and time.  Psychiatric:        Mood and Affect: Mood normal.     Procedures  Procedures  ED Course / MDM   Clinical Course as of 04/22/22 0841  Sun Apr 22, 2022  0400 Declines pain medication [KH]  0739 WBC(!): 13.3 [AC]  0800 Consulted with Dr. Brantley Stage of general surgery.  Discussed patient's case in detail.  Agrees with plan for admission, will likely need medical clearance prior to surgical intervention, may need clearance by cardiology as well given history.  States she can have liquids today, anticipate procedure within the week.  Recommends further imaging with RUQ [place] and admission to medicine. [AC]  6222 Consulted with Dr. Marylyn Ishihara of hospitalist team, discussed recommendations of general surgery. [AC]  9798 Consulted with pharmacist regarding antibiotic course. Mild childhood reaction many years ago to PCN, less likely to have reaction to cephalosporins with timespan between doses. Also given age and condition, would like to avoid fluoroquinolones if possible. Benadryl ordered PRN and plan to watch closely. If evidence of allergic  reaction, plan to switch to fluoroquinolone + flagyl combination. [AC]    Clinical Course User Index [AC] Prince Rome, PA-C [KH] Antonietta Breach, PA-C   Medical Decision Making Amount and/or Complexity of Data Reviewed Labs: ordered. Decision-making details documented in ED Course. Radiology: ordered.  Risk Prescription drug management. Decision regarding hospitalization.   Patient signed out to me at shift change.  Please see previous provider note for further details.  In short, this is a 87 year old female with Hx of HTN, CAD, mitral valve prolapse, hyperlipidemia, epilepsy, IDA, paroxysmal A-fib, CKD.  Presenting to ED with chief complaint of right-sided abdominal pain, worst of RUQ.  Pain started around 1400 yesterday, constant.  Initially felt a bulge around area of discomfort, however unable to locate at this time.  With nausea however without vomiting.  Without significant changes in bowel movement.  Hx prior appendectomy.  Results for orders placed or performed during the hospital encounter of 04/22/22  CBC with Differential  Result Value Ref Range   WBC 13.3 (H) 4.0 - 10.5 K/uL   RBC 3.04 (L) 3.87 - 5.11 MIL/uL   Hemoglobin 10.2 (L) 12.0 - 15.0 g/dL   HCT 31.7 (L) 36.0 - 46.0 %   MCV 104.3 (H) 80.0 - 100.0 fL   MCH 33.6 26.0 - 34.0 pg   MCHC 32.2 30.0 -  36.0 g/dL   RDW 16.0 (H) 11.5 - 15.5 %   Platelets 288 150 - 400 K/uL   nRBC 0.0 0.0 - 0.2 %   Neutrophils Relative % 90 %   Neutro Abs 11.9 (H) 1.7 - 7.7 K/uL   Lymphocytes Relative 3 %   Lymphs Abs 0.4 (L) 0.7 - 4.0 K/uL   Monocytes Relative 7 %   Monocytes Absolute 0.9 0.1 - 1.0 K/uL   Eosinophils Relative 0 %   Eosinophils Absolute 0.0 0.0 - 0.5 K/uL   Basophils Relative 0 %   Basophils Absolute 0.0 0.0 - 0.1 K/uL   Immature Granulocytes 0 %   Abs Immature Granulocytes 0.04 0.00 - 0.07 K/uL  Comprehensive metabolic panel  Result Value Ref Range   Sodium 141 135 - 145 mmol/L   Potassium 3.7 3.5 - 5.1  mmol/L   Chloride 105 98 - 111 mmol/L   CO2 27 22 - 32 mmol/L   Glucose, Bld 140 (H) 70 - 99 mg/dL   BUN 25 (H) 8 - 23 mg/dL   Creatinine, Ser 1.05 (H) 0.44 - 1.00 mg/dL   Calcium 8.2 (L) 8.9 - 10.3 mg/dL   Total Protein 7.0 6.5 - 8.1 g/dL   Albumin 3.7 3.5 - 5.0 g/dL   AST 29 15 - 41 U/L   ALT 18 0 - 44 U/L   Alkaline Phosphatase 68 38 - 126 U/L   Total Bilirubin 0.6 0.3 - 1.2 mg/dL   GFR, Estimated 52 (L) >60 mL/min   Anion gap 9 5 - 15  Lipase, blood  Result Value Ref Range   Lipase 42 11 - 51 U/L  Urinalysis, Routine w reflex microscopic Urine, Clean Catch  Result Value Ref Range   Color, Urine YELLOW YELLOW   APPearance CLEAR CLEAR   Specific Gravity, Urine 1.024 1.005 - 1.030   pH 6.0 5.0 - 8.0   Glucose, UA NEGATIVE NEGATIVE mg/dL   Hgb urine dipstick NEGATIVE NEGATIVE   Bilirubin Urine NEGATIVE NEGATIVE   Ketones, ur NEGATIVE NEGATIVE mg/dL   Protein, ur 100 (A) NEGATIVE mg/dL   Nitrite NEGATIVE NEGATIVE   Leukocytes,Ua NEGATIVE NEGATIVE   RBC / HPF 0-5 0 - 5 RBC/hpf   WBC, UA 0-5 0 - 5 WBC/hpf   Bacteria, UA RARE (A) NONE SEEN   Mucus PRESENT    Hyaline Casts, UA PRESENT    CT ABDOMEN PELVIS W CONTRAST  Addendum Date: 04/22/2022   ADDENDUM REPORT: 04/22/2022 07:16 ADDENDUM: As mentioned in the body of the report there is a 2.3 x 1.5 cm left adrenal nodule which is indeterminate. Attention at time of forthcoming abdominal MRI is recommended for further characterization. Electronically Signed   By: Vinnie Langton M.D.   On: 04/22/2022 07:16   Result Date: 04/22/2022 CLINICAL DATA:  86 year old female with history of right-sided abdominal tenderness and nausea. EXAM: CT ABDOMEN AND PELVIS WITH CONTRAST TECHNIQUE: Multidetector CT imaging of the abdomen and pelvis was performed using the standard protocol following bolus administration of intravenous contrast. RADIATION DOSE REDUCTION: This exam was performed according to the departmental dose-optimization program  which includes automated exposure control, adjustment of the mA and/or kV according to patient size and/or use of iterative reconstruction technique. CONTRAST:  174m OMNIPAQUE IOHEXOL 300 MG/ML  SOLN COMPARISON:  CT of the abdomen and pelvis 04/17/2021. FINDINGS: Lower chest: Bilateral pleural effusions with extensive passive subsegmental atelectasis in the visualized lung bases. Cardiomegaly. Pacemaker leads noted in the right-side of the heart. Hepatobiliary:  No suspicious cystic or solid hepatic lesions. No intra or extrahepatic biliary ductal dilatation. 2.4 x 1.5 cm calcified gallstone lying dependently in the gallbladder. Gallbladder is moderately distended. Gallbladder wall is thickened diffusely with heterogeneous areas of enhancement. Trace volume of pericholecystic fluid. Pancreas: No pancreatic mass. No pancreatic ductal dilatation. No pancreatic or peripancreatic fluid collections or inflammatory changes. Spleen: Subcentimeter hypovascular lesion in the anterior aspect of the spleen, too small to characterize, but similar to the prior study from 2022, presumably benign (no imaging follow-up recommended). Adrenals/Urinary Tract: Low-attenuation lesions in both kidneys, compatible with simple cysts, largest of which is exophytic in the posteromedial aspect of the upper pole of the left kidney measuring 2.7 cm in diameter (no imaging follow-up recommended). Multifocal cortical thinning in both kidneys. No suspicious renal lesions. No hydroureteronephrosis. Urinary bladder is normal in appearance. Right adrenal gland is normal in appearance. 2.3 x 1.5 cm left adrenal nodule (axial image 22 of series 2). Stomach/Bowel: The stomach is nearly completely decompressed, but otherwise unremarkable in appearance. No pathologic dilatation of small bowel or colon. Normal appendix. Vascular/Lymphatic: Atherosclerosis throughout the abdominal aorta and pelvic vasculature, without evidence of aneurysm or dissection in  the abdominal or pelvic vasculature. No lymphadenopathy noted in the abdomen or pelvis. Reproductive: Uterus and ovaries are atrophic. Other: Supraumbilical ventral hernia containing some omental fat and a small volume of ascites. No pneumoperitoneum. Small volume of ascites. Musculoskeletal: There are no aggressive appearing lytic or blastic lesions noted in the visualized portions of the skeleton. IMPRESSION: 1. Gallbladder is distended with extensive mucosal hyperenhancement, mural thickening and trace amount of pericholecystic fluid. There is a large calcified gallstone within the lumen of the gallbladder, however, this does not appear to be in an obstructive position at this time. Overall, findings are clearly abnormal and concerning for potential acute cholecystitis, but correlation with abdominal MRI with and without IV gadolinium with MRCP is recommended to better evaluate these findings and assess for potential choledocholithiasis. Surgical consultation is also recommended. 2. Small volume of ascites. 3. Supraumbilical ventral hernia containing omental fat and small volume of ascites. No definite bowel incarceration or obstruction at this time. 4. Bilateral pleural effusions with passive areas of subsegmental atelectasis in the visualized lung bases. 5. Cardiomegaly. 6. Aortic atherosclerosis. Electronically Signed: By: Vinnie Langton M.D. On: 04/22/2022 07:09     Workup pertinent findings: WBC 13.3 with leukocytosis Creatinine 1.05, BUN 25, GFR 52 Lipase 42 LFTs unremarkable CT abdomen/pelvis:  1. Gallbladder is distended with extensive mucosal hyperenhancement, mural thickening and trace amount of pericholecystic fluid. There is a large calcified gallstone within the lumen of the gallbladder, however, this does not appear to be in an obstructive position at this time. Overall, findings are clearly abnormal and concerning for potential acute cholecystitis, but correlation with abdominal MRI with  and without IV gadolinium with MRCP is recommended to better evaluate these findings and assess for potential choledocholithiasis. Surgical consultation is also recommended.  2. Small volume of ascites.  3. Supraumbilical ventral hernia containing omental fat and small volume of ascites. No definite bowel incarceration or obstruction at this time.  4. Bilateral pleural effusions with passive areas of subsegmental atelectasis in the visualized lung bases.  5. Cardiomegaly.   Medications: Pt has declined pain medication  Consultations General surgery Hospitalist  Plan Admission to medicine   This chart was dictated using voice recognition software.  Despite best efforts to proofread, errors can occur which can change the documentation meaning.  Prince Rome, PA-C 65/99/35 [id number]    Varney Biles, MD 04/25/22 1251

## 2022-04-22 NOTE — Progress Notes (Signed)
Pharmacy Antibiotic Note  [name] is a 86 y.o. female admitted on 04/22/2022 with acute cholecystitis  Pharmacy has been consulted for cefepime dosing.  Plan: Cefepime 2g IV q12 per current renal function  Height: '5\' 6"'$  (167.6 cm) Weight: 62.1 kg (137 lb) IBW/kg (Calculated) : 59.3  Temp (24hrs), Avg:98 F (36.7 C), Min:97.9 F (36.6 C), Max:98 F (36.7 C)  Recent Labs  Lab 04/22/22 0434  WBC 13.3*  CREATININE 1.05*    Estimated Creatinine Clearance: 37.3 mL/min (A) (by C-G formula based on SCr of 1.05 mg/dL (H)).    Allergies  Allergen Reactions   Aleve [Naproxen] Other (See Comments)   Azithromycin Other (See Comments)    Reaction not recalled   Penicillins Hives and Other (See Comments)    Bad reaction as a child after a shot of Penicillin   Pravastatin Other (See Comments)    Reaction not recalled   Sulfa Antibiotics Other (See Comments)   Sulfa Drugs Cross Reactors Other (See Comments)    Reaction not recalled- stated she can take it "in small doses"   Tape Itching and Other (See Comments)    Cannot wear for an extended period of time   Naproxen Sodium Rash    Thank you for allowing pharmacy to be a part of this patient's care.  Kara Mead 04/22/2022 9:44 AM

## 2022-04-22 NOTE — Progress Notes (Signed)
   04/22/22 1722  Assess: MEWS Score  Temp 98.2 F (36.8 C)  BP (!) 181/39  MAP (mmHg) 79  Pulse Rate (!) 119  Resp 20  SpO2 94 %  O2 Device Room Air  Assess: MEWS Score  MEWS Temp 0  MEWS Systolic 0  MEWS Pulse 2  MEWS RR 0  MEWS LOC 0  MEWS Score 2  MEWS Score Color Yellow  Assess: if the MEWS score is Yellow or Red  Were vital signs taken at a resting state? Yes  Focused Assessment No change from prior assessment  Does the patient meet 2 or more of the SIRS criteria? Yes  Does the patient have a confirmed or suspected source of infection? No  Provider and Rapid Response Notified? No  MEWS guidelines implemented *See Row Information* Yes  Treat  MEWS Interventions Administered prn meds/treatments  Take Vital Signs  Increase Vital Sign Frequency  Yellow: Q 2hr X 2 then Q 4hr X 2, if remains yellow, continue Q 4hrs  Escalate  MEWS: Escalate Yellow: discuss with charge nurse/RN and consider discussing with provider and RRT  Notify: Charge Nurse/RN  Name of Charge Nurse/RN Notified Abigail Butts, RN  Date Charge Nurse/RN Notified 04/22/22  Time Charge Nurse/RN Notified 1730  Notify: Provider  Provider Name/Title Marylyn Ishihara  Date Provider Notified 04/22/22  Time Provider Notified 1730  Method of Notification Page  Notification Reason Change in status  Provider response See new orders  Date of Provider Response 04/22/22  Time of Provider Response 1744  Document  Patient Outcome Stabilized after interventions  Progress note created (see row info) Yes  Assess: SIRS CRITERIA  SIRS Temperature  0  SIRS Pulse 1  SIRS Respirations  0  SIRS WBC 1  SIRS Score Sum  2   Pt in Yellow MEWS d/t elevated HR 119 and BP 181/39. MD notified. Agricultural consultant notified. PRN medication administered. EKG completed. Yellow MEWS guidelines implemented.

## 2022-04-22 NOTE — H&P (Addendum)
History and Physical    Patient: [name] MWU:[id number] DOB: [birth year] DOA: 04/22/2022 DOS: the patient was seen and examined on 04/22/2022 PCP: Virgie Dad, MD  Patient coming from: ALF/ILF  Chief Complaint:  Chief Complaint  Patient presents with   Abdominal Pain   HPI: [name] is a 86 y.o. female with medical history significant of CHB s/p PPM, A fib, HTN, complex partial epilepsy. Presenting with abdominal pain. She was in her normal state of health until yesterday morning. A couple of hours after eating breakfast, she had an episode of crampy RUQ abdominal pain. It was non-radiating and came in waves. It caused her to feel nauseous and she had some vomiting. She didn't try any medications to help. The staff at her facility recommended that she go to the ED for evaluation. She denies any other aggravating or alleviating factors.   Review of Systems: As mentioned in the history of present illness. All other systems reviewed and are negative. Past Medical History:  Diagnosis Date   Abnormal cardiovascular stress test    Carotid arterial disease (HCC)    MODERATE RIGHT   Cerebral AV malformation    DDD (degenerative disc disease)    SEVERE WITH SPINAL STENOSIS   Hypercholesterolemia    Hypertension    MVP (mitral valve prolapse)    MILD   Numbness    LEFT FOOT AND ANKLE   Osteopenia    Pseudo-gout    Past Surgical History:  Procedure Laterality Date   APPENDECTOMY     BREAST MASS EXCISION     CARPAL TUNNEL RELEASE     PACEMAKER IMPLANT N/A 11/07/2021   Procedure: PACEMAKER IMPLANT;  Surgeon: Evans Lance, MD;  Location: [facility];  Service: Cardiovascular;  Laterality: N/A;   REMOVAL OF PARATHYROID GLAND     STRESS NUCLEAR STUDY     ABNORMAL   TONSILLECTOMY     Social History:  reports that she quit smoking about 53 years ago. Her smoking use included cigarettes. She has a 14.00 pack-year smoking history. She has never used smokeless  tobacco. She reports that she does not drink alcohol and does not use drugs.  Allergies  Allergen Reactions   Aleve [Naproxen] Other (See Comments)   Azithromycin Other (See Comments)    Reaction not recalled   Penicillins Hives and Other (See Comments)    Bad reaction as a child after a shot of Penicillin   Pravastatin Other (See Comments)    Reaction not recalled   Sulfa Antibiotics Other (See Comments)   Sulfa Drugs Cross Reactors Other (See Comments)    Reaction not recalled- stated she can take it "in small doses"   Tape Itching and Other (See Comments)    Cannot wear for an extended period of time   Naproxen Sodium Rash    Family History  Problem Relation Age of Onset   Stroke Mother    Lung cancer Father    Breast cancer Neg Hx     Prior to Admission medications   Medication Sig Start Date End Date Taking? Authorizing Provider  acetaminophen (TYLENOL) 500 MG tablet Take 500 mg by mouth every 6 (six) hours as needed for mild pain.    [provider]  B Complex Vitamins (VITAMIN-B COMPLEX) TABS Take 1 tablet by mouth daily.    [provider]  docusate sodium (COLACE) 100 MG capsule Take 100 mg by mouth daily.    [provider]  fluticasone (  FLONASE) 50 MCG/ACT nasal spray Place 1-2 sprays into both nostrils daily.    [provider]  furosemide (LASIX) 20 MG tablet Take 20 mg by mouth 3 (three) times a week.    [provider]  hydrALAZINE (APRESOLINE) 10 MG tablet Take 25 mg by mouth 2 (two) times daily. 04/15/22   [provider]  loperamide (IMODIUM A-D) 2 MG tablet Take 1 tablet (2 mg total) by mouth as needed for diarrhea or loose stools. 06/21/21   Ngetich, Dinah C, NP  Multiple Vitamins-Minerals (MULTI COMPLETE/IRON) TABS Take 1 tablet by mouth daily with breakfast. Folic acid included    [provider]  nystatin powder Apply 1 Application topically 2 (two) times daily. Apply powder to reddened areas BID.     [provider]  ondansetron (ZOFRAN-ODT) 4 MG disintegrating tablet Take one tablet by mouth every 4 hours as needed. 02/20/22   [provider]  PHENobarbital (LUMINAL) 64.8 MG tablet Take 1 tablet (64.8 mg total) by mouth in the morning. 04/09/22   Royal Hawthorn, NP  potassium chloride SA (KLOR-CON M) 20 MEQ tablet Take 20 mEq by mouth 3 (three) times a week.    [provider]  saccharomyces boulardii (FLORASTOR) 250 MG capsule Take 250 mg by mouth 2 (two) times daily.    [provider]  valsartan (DIOVAN) 160 MG tablet Take 1 tablet (160 mg total) by mouth daily. 12/11/21   Lenna Sciara, NP    Physical Exam: Vitals:   04/22/22 0545 04/22/22 0630 04/22/22 0711 04/22/22 0730  BP: (!) 177/41 128/84  (!) 166/45  Pulse: 62 65 76 (!) 52  Resp: '17 17 16 16  '$ Temp:   97.9 F (36.6 C)   TempSrc:   Oral   SpO2: 95% 97% 97% 95%  Weight:      Height:       General: 86 y.o. female resting in bed in NAD Eyes: PERRL, normal sclera ENMT: Nares patent w/o discharge, orophaynx clear, dentition normal, ears w/o discharge/lesions/ulcers Neck: Supple, trachea midline Cardiovascular: RRR, +S1, S2, no m/g/r, equal pulses throughout Respiratory: CTABL, no w/r/r, normal WOB GI: BS+, ND, RUQ ab TTP, no masses noted, no organomegaly noted MSK: No e/c/c Neuro: A&O x 3, no focal deficits Psyc: Appropriate interaction and affect, calm/cooperative  Data Reviewed:  Results for orders placed or performed during the hospital encounter of 04/22/22 (from the past 24 hour(s))  CBC with Differential     Status: Abnormal   Collection Time: 04/22/22  4:34 AM  Result Value Ref Range   WBC 13.3 (H) 4.0 - 10.5 K/uL   RBC 3.04 (L) 3.87 - 5.11 MIL/uL   Hemoglobin 10.2 (L) 12.0 - 15.0 g/dL   HCT 31.7 (L) 36.0 - 46.0 %   MCV 104.3 (H) 80.0 - 100.0 fL   MCH 33.6 26.0 - 34.0 pg   MCHC 32.2 30.0 - 36.0 g/dL   RDW 16.0 (H) 11.5 - 15.5 %   Platelets 288 150 - 400 K/uL   nRBC  0.0 0.0 - 0.2 %   Neutrophils Relative % 90 %   Neutro Abs 11.9 (H) 1.7 - 7.7 K/uL   Lymphocytes Relative 3 %   Lymphs Abs 0.4 (L) 0.7 - 4.0 K/uL   Monocytes Relative 7 %   Monocytes Absolute 0.9 0.1 - 1.0 K/uL   Eosinophils Relative 0 %   Eosinophils Absolute 0.0 0.0 - 0.5 K/uL   Basophils Relative 0 %   Basophils Absolute  0.0 0.0 - 0.1 K/uL   Immature Granulocytes 0 %   Abs Immature Granulocytes 0.04 0.00 - 0.07 K/uL  Comprehensive metabolic panel     Status: Abnormal   Collection Time: 04/22/22  4:34 AM  Result Value Ref Range   Sodium 141 135 - 145 mmol/L   Potassium 3.7 3.5 - 5.1 mmol/L   Chloride 105 98 - 111 mmol/L   CO2 27 22 - 32 mmol/L   Glucose, Bld 140 (H) 70 - 99 mg/dL   BUN 25 (H) 8 - 23 mg/dL   Creatinine, Ser 1.05 (H) 0.44 - 1.00 mg/dL   Calcium 8.2 (L) 8.9 - 10.3 mg/dL   Total Protein 7.0 6.5 - 8.1 g/dL   Albumin 3.7 3.5 - 5.0 g/dL   AST 29 15 - 41 U/L   ALT 18 0 - 44 U/L   Alkaline Phosphatase 68 38 - 126 U/L   Total Bilirubin 0.6 0.3 - 1.2 mg/dL   GFR, Estimated 52 (L) >60 mL/min   Anion gap 9 5 - 15  Lipase, blood     Status: None   Collection Time: 04/22/22  4:34 AM  Result Value Ref Range   Lipase 42 11 - 51 U/L  Urinalysis, Routine w reflex microscopic Urine, Clean Catch     Status: Abnormal   Collection Time: 04/22/22  7:22 AM  Result Value Ref Range   Color, Urine YELLOW YELLOW   APPearance CLEAR CLEAR   Specific Gravity, Urine 1.024 1.005 - 1.030   pH 6.0 5.0 - 8.0   Glucose, UA NEGATIVE NEGATIVE mg/dL   Hgb urine dipstick NEGATIVE NEGATIVE   Bilirubin Urine NEGATIVE NEGATIVE   Ketones, ur NEGATIVE NEGATIVE mg/dL   Protein, ur 100 (A) NEGATIVE mg/dL   Nitrite NEGATIVE NEGATIVE   Leukocytes,Ua NEGATIVE NEGATIVE   RBC / HPF 0-5 0 - 5 RBC/hpf   WBC, UA 0-5 0 - 5 WBC/hpf   Bacteria, UA RARE (A) NONE SEEN   Mucus PRESENT    Hyaline Casts, UA PRESENT    CT ab/pelvis 1. Gallbladder is distended with extensive mucosal hyperenhancement, mural  thickening and trace amount of pericholecystic fluid. There is a large calcified gallstone within the lumen of the gallbladder, however, this does not appear to be in an obstructive position at this time. Overall, findings are clearly abnormal and concerning for potential acute cholecystitis, but correlation with abdominal MRI with and without IV gadolinium with MRCP is recommended to better evaluate these findings and assess for potential choledocholithiasis. Surgical consultation is also recommended. 2. Small volume of ascites. 3. Supraumbilical ventral hernia containing omental fat and small volume of ascites. No definite bowel incarceration or obstruction at this time. 4. Bilateral pleural effusions with passive areas of subsegmental atelectasis in the visualized lung bases. 5. Cardiomegaly. 6. Aortic atherosclerosis ADDENDUM: As mentioned in the body of the report there is a 2.3 x 1.5 cm left adrenal nodule which is indeterminate. Attention at time of forthcoming abdominal MRI is recommended for further characterization.  RUQ [place] Ab Large gallbladder stone and possible small gallbladder polyps. There is diffuse wall thickening in gallbladder which may be due to acute or chronic cholecystitis. Technologist did not observe any tenderness over the gallbladder during the study. Please correlate with clinical physical examination findings and laboratory findings. There is mild nodularity in liver surface suggesting possible cirrhosis. Small perihepatic ascites. Right pleural effusion.  Assessment and Plan: Acute cholecystitis     - place in obs, tele     -  continue cefepime, flagyl     - fluids     - imaging as above     - general surgery to see; keep NPO until they eval     - anti-emetics, pain control     - Gupta perioperative MI risk: 0.7%  HTN     - continue home regimen when confirmed  Hyperglycemia     - check A1c; no history of DM  Macrocytic anemia     - no  evidence of bleed     - check B12, folate  CHB s/p PPM PAF     - continue home regimen when confirmed  Complex partial epilepsy     - continue home regimen when confirmed  Hypocalcemia     - corrects to 8.4     - replace Ca2+, check Mg2+, PTH, vit D  Advance Care Planning:   Code Status: DNR  Consults: General surgery  Family Communication: None at bedside  Severity of Illness: The appropriate patient status for this patient is OBSERVATION. Observation status is judged to be reasonable and necessary in order to provide the required intensity of service to ensure the patient's safety. The patient's presenting symptoms, physical exam findings, and initial radiographic and laboratory data in the context of their medical condition is felt to place them at decreased risk for further clinical deterioration. Furthermore, it is anticipated that the patient will be medically stable for discharge from the hospital within 2 midnights of admission.   Author: Jonnie Finner, DO 04/22/2022 8:37 AM  For on call review [email or web address].

## 2022-04-23 ENCOUNTER — Encounter (HOSPITAL_COMMUNITY)
Admission: EM | Disposition: A | Discharge status: Skilled Nursing Facility | Payer: Self-pay | Source: Skilled Nursing Facility | Attending: Internal Medicine

## 2022-04-23 ENCOUNTER — Observation Stay (HOSPITAL_COMMUNITY): Payer: Medicare Other | Admitting: Anesthesiology

## 2022-04-23 ENCOUNTER — Encounter (HOSPITAL_COMMUNITY): Payer: Self-pay | Admitting: Internal Medicine

## 2022-04-23 ENCOUNTER — Observation Stay (HOSPITAL_BASED_OUTPATIENT_CLINIC_OR_DEPARTMENT_OTHER): Payer: Medicare Other | Admitting: Anesthesiology

## 2022-04-23 DIAGNOSIS — Z87891 Personal history of nicotine dependence: Secondary | ICD-10-CM

## 2022-04-23 DIAGNOSIS — K82A1 Gangrene of gallbladder in cholecystitis: Secondary | ICD-10-CM

## 2022-04-23 DIAGNOSIS — I10 Essential (primary) hypertension: Secondary | ICD-10-CM

## 2022-04-23 DIAGNOSIS — K8000 Calculus of gallbladder with acute cholecystitis without obstruction: Secondary | ICD-10-CM | POA: Diagnosis not present

## 2022-04-23 DIAGNOSIS — K8066 Calculus of gallbladder and bile duct with acute and chronic cholecystitis without obstruction: Secondary | ICD-10-CM | POA: Diagnosis not present

## 2022-04-23 DIAGNOSIS — D649 Anemia, unspecified: Secondary | ICD-10-CM | POA: Diagnosis not present

## 2022-04-23 DIAGNOSIS — K8012 Calculus of gallbladder with acute and chronic cholecystitis without obstruction: Secondary | ICD-10-CM | POA: Diagnosis not present

## 2022-04-23 DIAGNOSIS — K810 Acute cholecystitis: Secondary | ICD-10-CM | POA: Diagnosis not present

## 2022-04-23 HISTORY — PX: CHOLECYSTECTOMY: SHX55

## 2022-04-23 LAB — COMPREHENSIVE METABOLIC PANEL
ALT: 16 U/L (ref 0–44)
AST: 25 U/L (ref 15–41)
Albumin: 3.2 g/dL — ABNORMAL LOW (ref 3.5–5.0)
Alkaline Phosphatase: 58 U/L (ref 38–126)
Anion gap: 10 (ref 5–15)
BUN: 25 mg/dL — ABNORMAL HIGH (ref 8–23)
CO2: 25 mmol/L (ref 22–32)
Calcium: 8 mg/dL — ABNORMAL LOW (ref 8.9–10.3)
Chloride: 101 mmol/L (ref 98–111)
Creatinine, Ser: 1.25 mg/dL — ABNORMAL HIGH (ref 0.44–1.00)
GFR, Estimated: 43 mL/min — ABNORMAL LOW (ref >60)
Glucose, Bld: 116 mg/dL — ABNORMAL HIGH (ref 70–99)
Potassium: 4.2 mmol/L (ref 3.5–5.1)
Sodium: 136 mmol/L (ref 135–145)
Total Bilirubin: 0.9 mg/dL (ref 0.3–1.2)
Total Protein: 6.3 g/dL — ABNORMAL LOW (ref 6.5–8.1)

## 2022-04-23 LAB — GLUCOSE, CAPILLARY: Glucose-Capillary: 122 mg/dL — ABNORMAL HIGH (ref 70–99)

## 2022-04-23 LAB — CBC
HCT: 29.3 % — ABNORMAL LOW (ref 36.0–46.0)
Hemoglobin: 9.6 g/dL — ABNORMAL LOW (ref 12.0–15.0)
MCH: 33.8 pg (ref 26.0–34.0)
MCHC: 32.8 g/dL (ref 30.0–36.0)
MCV: 103.2 fL — ABNORMAL HIGH (ref 80.0–100.0)
Platelets: 249 10*3/uL (ref 150–400)
RBC: 2.84 MIL/uL — ABNORMAL LOW (ref 3.87–5.11)
RDW: 15.9 % — ABNORMAL HIGH (ref 11.5–15.5)
WBC: 15.4 10*3/uL — ABNORMAL HIGH (ref 4.0–10.5)
nRBC: 0 % (ref 0.0–0.2)

## 2022-04-23 SURGERY — LAPAROSCOPIC CHOLECYSTECTOMY WITH INTRAOPERATIVE CHOLANGIOGRAM
Anesthesia: General

## 2022-04-23 MED ORDER — OXYCODONE HCL 5 MG PO TABS: 2.5000 mg | ORAL_TABLET | ORAL | Status: DC | PRN

## 2022-04-23 MED ORDER — FENTANYL CITRATE (PF) 100 MCG/2ML IJ SOLN
INTRAMUSCULAR | Status: DC | PRN
  Administered 2022-04-23: 100 ug via INTRAVENOUS
  Administered 2022-04-23: 50 ug via INTRAVENOUS

## 2022-04-23 MED ORDER — FENTANYL CITRATE PF 50 MCG/ML IJ SOSY: 25.0000 ug | PREFILLED_SYRINGE | INTRAMUSCULAR | Status: DC | PRN

## 2022-04-23 MED ORDER — PROPOFOL 1000 MG/100ML IV EMUL
INTRAVENOUS | Status: AC
  Filled 2022-04-23: qty 100

## 2022-04-23 MED ORDER — ACETAMINOPHEN 160 MG/5ML PO SOLN: 1000.0000 mg | Freq: Once | ORAL | Status: DC | PRN

## 2022-04-23 MED ORDER — PROPOFOL 10 MG/ML IV BOLUS
INTRAVENOUS | Status: DC | PRN
  Administered 2022-04-23: 100 mg via INTRAVENOUS

## 2022-04-23 MED ORDER — BUPIVACAINE HCL (PF) 0.5 % IJ SOLN
INTRAMUSCULAR | Status: AC
  Filled 2022-04-23: qty 30

## 2022-04-23 MED ORDER — LIDOCAINE HCL (CARDIAC) PF 100 MG/5ML IV SOSY
PREFILLED_SYRINGE | INTRAVENOUS | Status: DC | PRN
  Administered 2022-04-23: 60 mg via INTRAVENOUS

## 2022-04-23 MED ORDER — LACTATED RINGERS IV SOLN: INTRAVENOUS | Status: DC | PRN

## 2022-04-23 MED ORDER — BUPIVACAINE HCL (PF) 0.5 % IJ SOLN
INTRAMUSCULAR | Status: DC | PRN
  Administered 2022-04-23: 20 mL

## 2022-04-23 MED ORDER — ACETAMINOPHEN 10 MG/ML IV SOLN
1000.0000 mg | Freq: Once | INTRAVENOUS | Status: DC | PRN
  Administered 2022-04-23: 1000 mg via INTRAVENOUS

## 2022-04-23 MED ORDER — ROCURONIUM BROMIDE 100 MG/10ML IV SOLN
INTRAVENOUS | Status: DC | PRN
  Administered 2022-04-23: 50 mg via INTRAVENOUS

## 2022-04-23 MED ORDER — MORPHINE SULFATE (PF) 2 MG/ML IV SOLN
2.0000 mg | INTRAVENOUS | Status: DC | PRN
  Administered 2022-04-25: 2 mg via INTRAVENOUS
  Filled 2022-04-23 (×2): qty 1

## 2022-04-23 MED ORDER — ACETAMINOPHEN 10 MG/ML IV SOLN
INTRAVENOUS | Status: AC
  Filled 2022-04-23: qty 100

## 2022-04-23 MED ORDER — ACETAMINOPHEN 325 MG PO TABS
650.0000 mg | ORAL_TABLET | Freq: Four times a day (QID) | ORAL | Status: DC
  Administered 2022-04-24 (×2): 650 mg via ORAL
  Filled 2022-04-23 (×2): qty 2

## 2022-04-23 MED ORDER — FENTANYL CITRATE (PF) 250 MCG/5ML IJ SOLN
INTRAMUSCULAR | Status: AC
  Filled 2022-04-23: qty 5

## 2022-04-23 MED ORDER — MAGNESIUM SULFATE 4 GM/100ML IV SOLN
4.0000 g | Freq: Once | INTRAVENOUS | Status: AC
  Administered 2022-04-23: 4 g via INTRAVENOUS
  Filled 2022-04-23: qty 100

## 2022-04-23 MED ORDER — DEXAMETHASONE SODIUM PHOSPHATE 10 MG/ML IJ SOLN
INTRAMUSCULAR | Status: AC
  Filled 2022-04-23: qty 1

## 2022-04-23 MED ORDER — ONDANSETRON HCL 4 MG/2ML IJ SOLN
INTRAMUSCULAR | Status: AC
  Filled 2022-04-23: qty 2

## 2022-04-23 MED ORDER — PROPOFOL 500 MG/50ML IV EMUL
INTRAVENOUS | Status: DC | PRN
  Administered 2022-04-23: 100 ug/kg/min via INTRAVENOUS

## 2022-04-23 MED ORDER — PHENYLEPHRINE HCL-NACL 20-0.9 MG/250ML-% IV SOLN
INTRAVENOUS | Status: AC
  Filled 2022-04-23: qty 250

## 2022-04-23 MED ORDER — PROPOFOL 10 MG/ML IV BOLUS
INTRAVENOUS | Status: AC
  Filled 2022-04-23: qty 20

## 2022-04-23 MED ORDER — ONDANSETRON HCL 4 MG/2ML IJ SOLN
INTRAMUSCULAR | Status: DC | PRN
  Administered 2022-04-23: 4 mg via INTRAVENOUS

## 2022-04-23 MED ORDER — ACETAMINOPHEN 500 MG PO TABS: 1000.0000 mg | ORAL_TABLET | Freq: Once | ORAL | Status: DC | PRN

## 2022-04-23 MED ORDER — DEXAMETHASONE SODIUM PHOSPHATE 10 MG/ML IJ SOLN
INTRAMUSCULAR | Status: DC | PRN
  Administered 2022-04-23: 4 mg via INTRAVENOUS

## 2022-04-23 MED ORDER — ROCURONIUM BROMIDE 10 MG/ML (PF) SYRINGE
PREFILLED_SYRINGE | INTRAVENOUS | Status: AC
  Filled 2022-04-23: qty 10

## 2022-04-23 MED ORDER — ENOXAPARIN SODIUM 40 MG/0.4ML IJ SOSY
40.0000 mg | PREFILLED_SYRINGE | INTRAMUSCULAR | Status: DC
  Administered 2022-04-24 – 2022-04-30 (×7): 40 mg via SUBCUTANEOUS
  Filled 2022-04-23 (×8): qty 0.4

## 2022-04-23 MED ORDER — ORAL CARE MOUTH RINSE: 15.0000 mL | OROMUCOSAL | Status: DC | PRN

## 2022-04-23 MED ORDER — SUGAMMADEX SODIUM 200 MG/2ML IV SOLN
INTRAVENOUS | Status: DC | PRN
  Administered 2022-04-23: 150 mg via INTRAVENOUS

## 2022-04-23 SURGICAL SUPPLY — 35 items
ADH SKN CLS APL DERMABOND .7 (GAUZE/BANDAGES/DRESSINGS) ×1
APL PRP STRL LF DISP 70% ISPRP (MISCELLANEOUS) ×1
APPLIER CLIP 5 13 M/L LIGAMAX5 (MISCELLANEOUS) ×1
APR CLP MED LRG 5 ANG JAW (MISCELLANEOUS) ×1
BAG COUNTER SPONGE SURGICOUNT (BAG) IMPLANT
BAG SPNG CNTER NS LX DISP (BAG)
CABLE HIGH FREQUENCY MONO STRZ (ELECTRODE) ×1 IMPLANT
CHLORAPREP W/TINT 26 (MISCELLANEOUS) ×1 IMPLANT
CLIP APPLIE 5 13 M/L LIGAMAX5 (MISCELLANEOUS) ×1 IMPLANT
COVER MAYO STAND XLG (MISCELLANEOUS) ×1 IMPLANT
DERMABOND ADVANCED .7 DNX12 (GAUZE/BANDAGES/DRESSINGS) ×1 IMPLANT
DRAPE C-ARM 42X120 X-RAY (DRAPES) IMPLANT
ELECT REM PT RETURN 15FT ADLT (MISCELLANEOUS) ×1 IMPLANT
GLOVE BIO SURGEON STRL SZ7.5 (GLOVE) ×1 IMPLANT
GOWN STRL REUS W/ TWL XL LVL3 (GOWN DISPOSABLE) ×2 IMPLANT
GOWN STRL REUS W/TWL XL LVL3 (GOWN DISPOSABLE) ×2
HEMOSTAT SURGICEL 4X8 (HEMOSTASIS) IMPLANT
IRRIG SUCT STRYKERFLOW 2 WTIP (MISCELLANEOUS) ×1
IRRIGATION SUCT STRKRFLW 2 WTP (MISCELLANEOUS) ×1 IMPLANT
KIT BASIN OR (CUSTOM PROCEDURE TRAY) ×1 IMPLANT
KIT TURNOVER KIT A (KITS) IMPLANT
PENCIL SMOKE EVACUATOR (MISCELLANEOUS) IMPLANT
SCISSORS LAP 5X35 DISP (ENDOMECHANICALS) ×1 IMPLANT
SET CHOLANGIOGRAPH MIX (MISCELLANEOUS) IMPLANT
SET TUBE SMOKE EVAC HIGH FLOW (TUBING) ×1 IMPLANT
SLEEVE Z-THREAD 5X100MM (TROCAR) ×2 IMPLANT
SPIKE FLUID TRANSFER (MISCELLANEOUS) ×1 IMPLANT
SUT MNCRL AB 4-0 PS2 18 (SUTURE) ×1 IMPLANT
SYS BAG RETRIEVAL 10MM (BASKET) ×1
SYSTEM BAG RETRIEVAL 10MM (BASKET) ×1 IMPLANT
TOWEL OR 17X26 10 PK STRL BLUE (TOWEL DISPOSABLE) ×1 IMPLANT
TOWEL OR NON WOVEN STRL DISP B (DISPOSABLE) ×1 IMPLANT
TRAY LAPAROSCOPIC (CUSTOM PROCEDURE TRAY) ×1 IMPLANT
TROCAR BALLN 12MMX100 BLUNT (TROCAR) ×1 IMPLANT
TROCAR Z-THREAD OPTICAL 5X100M (TROCAR) ×1 IMPLANT

## 2022-04-23 NOTE — Op Note (Signed)
LAPAROSCOPIC CHOLECYSTECTOMY  Procedure Note  [name] 04/22/2022 - 04/23/2022   Pre-op Diagnosis: ACUTE CHOLECYSTITIS WITH CHOLECYSTITIS     Post-op Diagnosis: GANGRENOUS CHOLECYSTITIS WITH CHOLELITHIASIS  Procedure(s): LAPAROSCOPIC CHOLECYSTECTOMY  Surgeon(s): Coralie Keens, MD  Anesthesia: General  Staff:  Circulator: Alena Bills, RN Relief Circulator: Sherri Sear, RN Scrub Person: Sanda Klein; Cranford Mon  Estimated Blood Loss: Minimal               Specimens: sent to path  Findings: The patient was found to be gangrenous, infarcted gallbladder with gallstones  Procedure: The patient was brought to the operating room identifies correct patient.  She is placed upon the operating table general anesthesia was induced.  Her abdomen was prepped and draped in usual sterile fashion.  I made a vertical incision just above the umbilicus with a scalpel.  I took this down to her small fascial defect from a small umbilical hernia.  I excised the small hernia sac with the cautery.  I then placed a 0 Vicryl pursestring suture around the fascial opening.  The [facility] port was placed in the opening and insufflation of the abdomen was begun.  The patient had a moderate amount of ascites and a gangrenous, infarcted gallbladder.  I placed a 5 mm trocar in the patient's epigastrium and 2 more in the right upper quadrant both under direct vision.  I did aspirate bile from the gallbladder.  I then was able to grasp the gallbladder and retracted above the liver bed.  The gallbladder had very minimal attachments to the liver.  I was able to easily dissect out the cystic duct and cystic artery and achieved a critical window around both.  I clipped each proximally and distally and transected them with the laparoscopic scissors.  I then dissected the gallbladder free from the liver bed with the cautery.  Again, the gallbladder was minimally attached to the liver itself.   There was no evidence of cirrhosis although the liver itself had a gray appearance.  The gallbladder is placed in an Endosac and removed the incision at the umbilicus.  I copiously irrigated the abdomen normal saline.  Again, hemostasis appeared to be achieved.  I closed 0 Vicryl the umbilicus tying the Vicryl suture in place.  I then reinforced with another figure-of-eight 0 Vicryl suture.  All ports were removed and the abdomen was deflated.  All incisions were anesthetized Marcaine and closed with 4-0 Monocryl sutures.  Dermabond was then applied.  The patient tolerated the procedure well.  All the counts were correct at the end of the procedure.  The patient was then extubated in the operating room and taken in stable condition to the recovery room.  Coralie Keens   Date: 04/23/2022  Time: 3:38 PM

## 2022-04-23 NOTE — Plan of Care (Signed)

## 2022-04-23 NOTE — Progress Notes (Signed)
PROGRESS NOTE    [name]  MPN:[id number] DOB: June 16, 1935 DOA: 04/22/2022 PCP: Virgie Dad, MD    Brief Narrative:  86 year old with history of complete heart block status post pacemaker, A-fib, hypertension, complex partial epilepsy since childhood presented with upper abdominal pain that was quite sudden onset.  She does live in an assisted living facility.  In the emergency room she was found to have cholecystitis with cholelithiasis.  Admitted with surgical consultation.   Assessment & Plan:   Cholecystitis and cholelithiasis, symptomatic gallstone: N.p.o., IV fluids, LFTs are normal.  Surgery following.  Lap chole with cholangiogram today. Continue antibiotics until surgery.  Hypomagnesemia: 1.1.  Severe and persistent.  Does have a history of chronic hypomagnesemia and recently stopped taking magnesium supplements due to diarrhea.  Replace aggressively today.  Recheck levels.  She will need ongoing replacement on discharge.  Chronic medical issues Essential hypertension: Stable. Hyperlipidemia: Stable. Paroxysmal A-fib: Not on anticoagulation.  Status post pacemaker.  Perioperative medical assessment: Patient with well-controlled chronic medical issues.  She is acceptable to go for laparoscopic abdominal surgery without further invasive cardiac testing.    DVT prophylaxis: enoxaparin (LOVENOX) injection 40 mg Start: 04/22/22 2000   Code Status: DNR Family Communication: Daughter at the bedside Disposition Plan: Status is: Observation The patient will require care spanning > 2 midnights and should be moved to inpatient because: Antibiotics, inpatient surgery anticipated     Consultants:  General surgery  Procedures:  None  Antimicrobials:  Cefepime and Flagyl 11/12---   Subjective: Patient was seen and examined.  She was walking around in the hallway with the help of mobility technician.  Mild pain epigastrium.  She also has a epigastric hernia.   Denies any nausea vomiting.  Blood pressure is elevated as she had not received morning medications.  Objective: Vitals:   04/22/22 1722 04/22/22 1924 04/23/22 0125 04/23/22 0549  BP: (!) 181/39 (!) 151/44 (!) 153/69 (!) 183/47  Pulse: (!) 119 (!) 58 62 66  Resp: '20 16 16 20  '$ Temp: 98.2 F (36.8 C) 98.7 F (37.1 C) 100 F (37.8 C) 98 F (36.7 C)  TempSrc: Oral Oral Oral Oral  SpO2: 94% 95% 90% 95%  Weight:      Height:        Intake/Output Summary (Last 24 hours) at 04/23/2022 1150 Last data filed at 04/23/2022 0700 Gross per 24 hour  Intake 951.09 ml  Output --  Net 951.09 ml   Filed Weights   04/22/22 0406  Weight: 62.1 kg    Examination:  General exam: Appears calm and comfortable .  Currently without any pain. Respiratory system: Clear to auscultation. Respiratory effort normal. Cardiovascular system: S1 & S2 heard, RRR.  Chronic bilateral trace pedal edema. Gastrointestinal system: Soft with mild right upper quadrant tenderness.  Palpable and reducible epigastric hernia present. Central nervous system: Alert and oriented. No focal neurological deficits. Extremities: Symmetric 5 x 5 power. Skin: No rashes, lesions or ulcers Psychiatry: Judgement and insight appear normal. Mood & affect appropriate.     Data Reviewed: I have personally reviewed following labs and imaging studies  CBC: Recent Labs  Lab 04/22/22 0434 04/23/22 0811  WBC 13.3* 15.4*  NEUTROABS 11.9*  --   HGB 10.2* 9.6*  HCT 31.7* 29.3*  MCV 104.3* 103.2*  PLT 288 008   Basic Metabolic Panel: Recent Labs  Lab 04/22/22 0434 04/22/22 1826 04/23/22 0811  NA 141  --  136  K 3.7  --  4.2  CL 105  --  101  CO2 27  --  25  GLUCOSE 140*  --  116*  BUN 25*  --  25*  CREATININE 1.05*  --  1.25*  CALCIUM 8.2*  --  8.0*  MG  --  1.1*  --    GFR: Estimated Creatinine Clearance: 31.4 mL/min (A) (by C-G formula based on SCr of 1.25 mg/dL (H)). Liver Function Tests: Recent Labs  Lab  04/22/22 0434 04/23/22 0811  AST 29 25  ALT 18 16  ALKPHOS 68 58  BILITOT 0.6 0.9  PROT 7.0 6.3*  ALBUMIN 3.7 3.2*   Recent Labs  Lab 04/22/22 0434  LIPASE 42   No results for input(s): "AMMONIA" in the last 168 hours. Coagulation Profile: No results for input(s): "INR", "PROTIME" in the last 168 hours. Cardiac Enzymes: No results for input(s): "CKTOTAL", "CKMB", "CKMBINDEX", "TROPONINI" in the last 168 hours. BNP (last 3 results) No results for input(s): "PROBNP" in the last 8760 hours. HbA1C: Recent Labs    04/22/22 1826  HGBA1C 4.9   CBG: No results for input(s): "GLUCAP" in the last 168 hours. Lipid Profile: No results for input(s): "CHOL", "HDL", "LDLCALC", "TRIG", "CHOLHDL", "LDLDIRECT" in the last 72 hours. Thyroid Function Tests: No results for input(s): "TSH", "T4TOTAL", "FREET4", "T3FREE", "THYROIDAB" in the last 72 hours. Anemia Panel: Recent Labs    04/22/22 1826  VITAMINB12 443  FOLATE 38.0   Sepsis Labs: No results for input(s): "PROCALCITON", "LATICACIDVEN" in the last 168 hours.  No results found for this or any previous visit (from the past 240 hour(s)).       Radiology Studies: US Abdomen Limited RUQ (LIVER/GB)  Result Date: 04/22/2022 CLINICAL DATA:  Pain right upper quadrant EXAM: ULTRASOUND ABDOMEN LIMITED RIGHT UPPER QUADRANT COMPARISON:  CT done earlier today FINDINGS: Gallbladder: There is 2 cm calculus in the lumen of gallbladder. There are possible small polyps in the margin of lumen. There is wall thickening in gallbladder measuring up to 11 mm. There is trace amount of pericholecystic fluid. Technologist did not observe any tenderness over the gallbladder during the study. Common bile duct: Diameter: 5 mm Liver: There is nodularity in liver surface. There is slightly increased echogenicity in liver. There is small amount of perihepatic ascites. Portal vein is patent on color Doppler imaging with normal direction of blood flow towards  the liver. Other: Right pleural effusion is seen. IMPRESSION: Large gallbladder stone and possible small gallbladder polyps. There is diffuse wall thickening in gallbladder which may be due to acute or chronic cholecystitis. Technologist did not observe any tenderness over the gallbladder during the study. Please correlate with clinical physical examination findings and laboratory findings. There is mild nodularity in liver surface suggesting possible cirrhosis. Small perihepatic ascites. Right pleural effusion. Electronically Signed   By: Elmer Picker M.D.   On: 04/22/2022 08:54   CT ABDOMEN PELVIS W CONTRAST  Addendum Date: 04/22/2022   ADDENDUM REPORT: 04/22/2022 07:16 ADDENDUM: As mentioned in the body of the report there is a 2.3 x 1.5 cm left adrenal nodule which is indeterminate. Attention at time of forthcoming abdominal MRI is recommended for further characterization. Electronically Signed   By: Vinnie Langton M.D.   On: 04/22/2022 07:16   Result Date: 04/22/2022 CLINICAL DATA:  86 year old female with history of right-sided abdominal tenderness and nausea. EXAM: CT ABDOMEN AND PELVIS WITH CONTRAST TECHNIQUE: Multidetector CT imaging of the abdomen and pelvis was performed using the standard protocol following bolus administration of intravenous  contrast. RADIATION DOSE REDUCTION: This exam was performed according to the departmental dose-optimization program which includes automated exposure control, adjustment of the mA and/or kV according to patient size and/or use of iterative reconstruction technique. CONTRAST:  171m OMNIPAQUE IOHEXOL 300 MG/ML  SOLN COMPARISON:  CT of the abdomen and pelvis 04/17/2021. FINDINGS: Lower chest: Bilateral pleural effusions with extensive passive subsegmental atelectasis in the visualized lung bases. Cardiomegaly. Pacemaker leads noted in the right-side of the heart. Hepatobiliary: No suspicious cystic or solid hepatic lesions. No intra or extrahepatic  biliary ductal dilatation. 2.4 x 1.5 cm calcified gallstone lying dependently in the gallbladder. Gallbladder is moderately distended. Gallbladder wall is thickened diffusely with heterogeneous areas of enhancement. Trace volume of pericholecystic fluid. Pancreas: No pancreatic mass. No pancreatic ductal dilatation. No pancreatic or peripancreatic fluid collections or inflammatory changes. Spleen: Subcentimeter hypovascular lesion in the anterior aspect of the spleen, too small to characterize, but similar to the prior study from 2022, presumably benign (no imaging follow-up recommended). Adrenals/Urinary Tract: Low-attenuation lesions in both kidneys, compatible with simple cysts, largest of which is exophytic in the posteromedial aspect of the upper pole of the left kidney measuring 2.7 cm in diameter (no imaging follow-up recommended). Multifocal cortical thinning in both kidneys. No suspicious renal lesions. No hydroureteronephrosis. Urinary bladder is normal in appearance. Right adrenal gland is normal in appearance. 2.3 x 1.5 cm left adrenal nodule (axial image 22 of series 2). Stomach/Bowel: The stomach is nearly completely decompressed, but otherwise unremarkable in appearance. No pathologic dilatation of small bowel or colon. Normal appendix. Vascular/Lymphatic: Atherosclerosis throughout the abdominal aorta and pelvic vasculature, without evidence of aneurysm or dissection in the abdominal or pelvic vasculature. No lymphadenopathy noted in the abdomen or pelvis. Reproductive: Uterus and ovaries are atrophic. Other: Supraumbilical ventral hernia containing some omental fat and a small volume of ascites. No pneumoperitoneum. Small volume of ascites. Musculoskeletal: There are no aggressive appearing lytic or blastic lesions noted in the visualized portions of the skeleton. IMPRESSION: 1. Gallbladder is distended with extensive mucosal hyperenhancement, mural thickening and trace amount of pericholecystic  fluid. There is a large calcified gallstone within the lumen of the gallbladder, however, this does not appear to be in an obstructive position at this time. Overall, findings are clearly abnormal and concerning for potential acute cholecystitis, but correlation with abdominal MRI with and without IV gadolinium with MRCP is recommended to better evaluate these findings and assess for potential choledocholithiasis. Surgical consultation is also recommended. 2. Small volume of ascites. 3. Supraumbilical ventral hernia containing omental fat and small volume of ascites. No definite bowel incarceration or obstruction at this time. 4. Bilateral pleural effusions with passive areas of subsegmental atelectasis in the visualized lung bases. 5. Cardiomegaly. 6. Aortic atherosclerosis. Electronically Signed: By: DVinnie LangtonM.D. On: 04/22/2022 07:09        Scheduled Meds:  enoxaparin (LOVENOX) injection  40 mg Subcutaneous Q24H   hydrALAZINE  25 mg Oral BID   irbesartan  150 mg Oral Daily   PHENobarbital  64.8 mg Oral q AM   phenytoin  200 mg Oral Q breakfast   Continuous Infusions:  ceFEPime (MAXIPIME) IV 2 g (04/22/22 2113)   lactated ringers 75 mL/hr at 04/23/22 0320   magnesium sulfate bolus IVPB     metronidazole 500 mg (04/22/22 2215)     LOS: 0 days    Time spent: 35 minutes    KBarb Merino MD [facility] Pager [phone]

## 2022-04-23 NOTE — Discharge Instructions (Signed)
[facility] LAPAROSCOPIC SURGERY: POST OP INSTRUCTIONS Always review your discharge instruction sheet given to you by the facility where your surgery was performed. IF YOU HAVE DISABILITY OR FAMILY LEAVE FORMS, YOU MUST BRING THEM TO THE OFFICE FOR PROCESSING.   DO NOT GIVE THEM TO YOUR DOCTOR.  PAIN CONTROL  First take acetaminophen (Tylenol) AND/or ibuprofen (Advil) to control your pain after surgery.  Follow directions on package.  Taking acetaminophen (Tylenol) and/or ibuprofen (Advil) regularly after surgery will help to control your pain and lower the amount of prescription pain medication you may need.  You should not take more than 3,000 mg (3 grams) of acetaminophen (Tylenol) in 24 hours.  You should not take ibuprofen (Advil), aleve, motrin, naprosyn or other NSAIDS if you have a history of stomach ulcers or chronic kidney disease.  A prescription for pain medication may be given to you upon discharge.  Take your pain medication as prescribed, if you still have uncontrolled pain after taking acetaminophen (Tylenol) or ibuprofen (Advil). Use ice packs to help control pain. If you need a refill on your pain medication, please contact your pharmacy.  They will contact our office to request authorization. Prescriptions will not be filled after 5pm or on week-ends.  HOME MEDICATIONS Take your usually prescribed medications unless otherwise directed.  DIET You should follow a light diet the first few days after arrival home.  Be sure to include lots of fluids daily. Avoid fatty, fried foods.   CONSTIPATION It is common to experience some constipation after surgery and if you are taking pain medication.  Increasing fluid intake and taking a stool softener (such as Colace) will usually help or prevent this problem from occurring.  A mild laxative (Milk of Magnesia or Miralax) should be taken according to package instructions if there are no bowel movements after 48  hours.  WOUND/INCISION CARE Most patients will experience some swelling and bruising in the area of the incisions.  Ice packs will help.  Swelling and bruising can take several days to resolve.  Unless discharge instructions indicate otherwise, follow guidelines below  STERI-STRIPS - you may remove your outer bandages 48 hours after surgery, and you may shower at that time.  You have steri-strips (small skin tapes) in place directly over the incision.  These strips should be left on the skin for 7-10 days.   DERMABOND/SKIN GLUE - you may shower in 24 hours.  The glue will flake off over the next 2-3 weeks. Any sutures or staples will be removed at the office during your follow-up visit.  ACTIVITIES You may resume regular (light) daily activities beginning the next day--such as daily self-care, walking, climbing stairs--gradually increasing activities as tolerated.  You may have sexual intercourse when it is comfortable.  Refrain from any heavy lifting or straining until approved by your doctor. You may drive when you are no longer taking prescription pain medication, you can comfortably wear a seatbelt, and you can safely maneuver your car and apply brakes.  FOLLOW-UP You should see your doctor in the office for a follow-up appointment approximately 2-3 weeks after your surgery.  You should have been given your post-op/follow-up appointment when your surgery was scheduled.  If you did not receive a post-op/follow-up appointment, make sure that you call for this appointment within a day or two after you arrive home to insure a convenient appointment time.   WHEN TO CALL YOUR DOCTOR: Fever over 101.0 Inability to urinate Continued bleeding from incision.   Increased pain, redness, or drainage from the incision. Increasing abdominal pain  The clinic staff is available to answer your questions during regular business hours.  Please don't hesitate to call and ask to speak to one of the nurses for  clinical concerns.  If you have a medical emergency, go to the nearest emergency room or call 911.  A surgeon from [facility] is always on call at the hospital. [address], [address], [place], [place]  [address] ? [address] [address], , [place]   [address] [phone] ? [phone] ? FAX [phone] Web site: [email or web address]  

## 2022-04-23 NOTE — Anesthesia Procedure Notes (Signed)
Procedure Name: Intubation Date/Time: 04/23/2022 2:56 PM  Performed by: Lollie Sails, CRNAPre-anesthesia Checklist: Patient identified, Emergency Drugs available, Suction available, Patient being monitored and Timeout performed Patient Re-evaluated:Patient Re-evaluated prior to induction Oxygen Delivery Method: Circle system utilized Preoxygenation: Pre-oxygenation with 100% oxygen Induction Type: IV induction Ventilation: Mask ventilation without difficulty Laryngoscope Size: 3 and Mac Grade View: Grade I Tube type: Oral Tube size: 7.0 mm Number of attempts: 1 Airway Equipment and Method: Stylet Placement Confirmation: ETT inserted through vocal cords under direct vision, positive ETCO2 and breath sounds checked- equal and bilateral Secured at: 22 cm Tube secured with: Tape Dental Injury: Teeth and Oropharynx as per pre-operative assessment

## 2022-04-23 NOTE — Transfer of Care (Signed)
Immediate Anesthesia Transfer of Care Note  Patient: [name]  Procedure(s) Performed: LAPAROSCOPIC CHOLECYSTECTOMY  Patient Location: PACU  Anesthesia Type:General  Level of Consciousness: awake, alert , and patient cooperative  Airway & Oxygen Therapy: Patient Spontanous Breathing and Patient connected to face mask oxygen  Post-op Assessment: Report given to RN and Post -op Vital signs reviewed and stable  Post vital signs: Reviewed and stable  Last Vitals:  Vitals Value Taken Time  BP 167/78 04/23/22 1549  Temp 36.8 C 04/23/22 1549  Pulse 63 04/23/22 1552  Resp 19 04/23/22 1552  SpO2 100 % 04/23/22 1552  Vitals shown include unvalidated device data.  Last Pain:  Vitals:   04/23/22 1549  TempSrc:   PainSc: 0-No pain      Patients Stated Pain Goal: 0 (46/21/94 7125)  Complications: No notable events documented.

## 2022-04-23 NOTE — Progress Notes (Signed)
Mobility Specialist - Progress Note   04/23/22 0957  Mobility  Activity Ambulated with assistance in hallway  Level of Assistance Minimal assist, patient does 75% or more  Assistive Device Front wheel walker  Distance Ambulated (ft) 90 ft  Activity Response Tolerated well  Mobility Referral Yes  $Mobility charge 1 Mobility   Pt received in bed and agreed to mobility, no c/o pain but some dizziness. Pt returned to chair with all needs met and alarm on.  Roderick Pee Mobility Specialist

## 2022-04-23 NOTE — Progress Notes (Signed)
Progress Note     Subjective: Pt reports some RUQ pain this AM but somewhat better from yesterday. Denies n/v this AM. Reports mild worsening of LE swelling since the summer but has seen cardiology since that time. No chest pain at rest or worsening DOE. Family member at bedside. Patient lives at [facility].   Objective: Vital signs in last 24 hours: Temp:  [98 F (36.7 C)-100 F (37.8 C)] 98 F (36.7 C) (11/13 0549) Pulse Rate:  [58-119] 66 (11/13 0549) Resp:  [16-20] 20 (11/13 0549) BP: (122-183)/(39-96) 183/47 (11/13 0549) SpO2:  [90 %-95 %] 95 % (11/13 0549) Last BM Date : 04/22/22  Intake/Output from previous day: 11/12 0701 - 11/13 0700 In: 951.1 [I.V.:751.1; IV Piggyback:200] Out: -  Intake/Output this shift: No intake/output data recorded.  PE: General: pleasant, WD, elderly female who is sitting up in NAD HEENT: sclera anicteric  Heart: regular, rate, and rhythm. Palpable pedal pulses bilaterally Lungs: Respiratory effort nonlabored Abd: soft, mildly ttp in R abdomen, ND MS: +1 edema in BLE Skin: warm and dry with no masses, lesions, or rashes Psych: A&Ox3 with an appropriate affect.    Lab Results:  Recent Labs    04/22/22 0434 04/23/22 0811  WBC 13.3* 15.4*  HGB 10.2* 9.6*  HCT 31.7* 29.3*  PLT 288 249   BMET Recent Labs    04/22/22 0434 04/23/22 0811  NA 141 136  K 3.7 4.2  CL 105 101  CO2 27 25  GLUCOSE 140* 116*  BUN 25* 25*  CREATININE 1.05* 1.25*  CALCIUM 8.2* 8.0*   PT/INR No results for input(s): "LABPROT", "INR" in the last 72 hours. CMP     Component Value Date/Time   NA 136 04/23/2022 0811   NA 137 04/03/2022 0000   K 4.2 04/23/2022 0811   CL 101 04/23/2022 0811   CO2 25 04/23/2022 0811   GLUCOSE 116 (H) 04/23/2022 0811   BUN 25 (H) 04/23/2022 0811   BUN 19 04/03/2022 0000   CREATININE 1.25 (H) 04/23/2022 0811   CREATININE 1.26 (H) 06/21/2021 1430   CALCIUM 8.0 (L) 04/23/2022 0811   PROT 6.3 (L)  04/23/2022 0811   ALBUMIN 3.2 (L) 04/23/2022 0811   AST 25 04/23/2022 0811   ALT 16 04/23/2022 0811   ALKPHOS 58 04/23/2022 0811   BILITOT 0.9 04/23/2022 0811   GFRNONAA 43 (L) 04/23/2022 0811   GFRAA 53.34 04/25/2021 0000   Lipase     Component Value Date/Time   LIPASE 42 04/22/2022 0434       Studies/Results: US Abdomen Limited RUQ (LIVER/GB)  Result Date: 04/22/2022 CLINICAL DATA:  Pain right upper quadrant EXAM: ULTRASOUND ABDOMEN LIMITED RIGHT UPPER QUADRANT COMPARISON:  CT done earlier today FINDINGS: Gallbladder: There is 2 cm calculus in the lumen of gallbladder. There are possible small polyps in the margin of lumen. There is wall thickening in gallbladder measuring up to 11 mm. There is trace amount of pericholecystic fluid. Technologist did not observe any tenderness over the gallbladder during the study. Common bile duct: Diameter: 5 mm Liver: There is nodularity in liver surface. There is slightly increased echogenicity in liver. There is small amount of perihepatic ascites. Portal vein is patent on color Doppler imaging with normal direction of blood flow towards the liver. Other: Right pleural effusion is seen. IMPRESSION: Large gallbladder stone and possible small gallbladder polyps. There is diffuse wall thickening in gallbladder which may be due to acute or chronic cholecystitis. Technologist did not  observe any tenderness over the gallbladder during the study. Please correlate with clinical physical examination findings and laboratory findings. There is mild nodularity in liver surface suggesting possible cirrhosis. Small perihepatic ascites. Right pleural effusion. Electronically Signed   By: Elmer Picker M.D.   On: 04/22/2022 08:54   CT ABDOMEN PELVIS W CONTRAST  Addendum Date: 04/22/2022   ADDENDUM REPORT: 04/22/2022 07:16 ADDENDUM: As mentioned in the body of the report there is a 2.3 x 1.5 cm left adrenal nodule which is indeterminate. Attention at time of  forthcoming abdominal MRI is recommended for further characterization. Electronically Signed   By: Vinnie Langton M.D.   On: 04/22/2022 07:16   Result Date: 04/22/2022 CLINICAL DATA:  86 year old female with history of right-sided abdominal tenderness and nausea. EXAM: CT ABDOMEN AND PELVIS WITH CONTRAST TECHNIQUE: Multidetector CT imaging of the abdomen and pelvis was performed using the standard protocol following bolus administration of intravenous contrast. RADIATION DOSE REDUCTION: This exam was performed according to the departmental dose-optimization program which includes automated exposure control, adjustment of the mA and/or kV according to patient size and/or use of iterative reconstruction technique. CONTRAST:  183m OMNIPAQUE IOHEXOL 300 MG/ML  SOLN COMPARISON:  CT of the abdomen and pelvis 04/17/2021. FINDINGS: Lower chest: Bilateral pleural effusions with extensive passive subsegmental atelectasis in the visualized lung bases. Cardiomegaly. Pacemaker leads noted in the right-side of the heart. Hepatobiliary: No suspicious cystic or solid hepatic lesions. No intra or extrahepatic biliary ductal dilatation. 2.4 x 1.5 cm calcified gallstone lying dependently in the gallbladder. Gallbladder is moderately distended. Gallbladder wall is thickened diffusely with heterogeneous areas of enhancement. Trace volume of pericholecystic fluid. Pancreas: No pancreatic mass. No pancreatic ductal dilatation. No pancreatic or peripancreatic fluid collections or inflammatory changes. Spleen: Subcentimeter hypovascular lesion in the anterior aspect of the spleen, too small to characterize, but similar to the prior study from 2022, presumably benign (no imaging follow-up recommended). Adrenals/Urinary Tract: Low-attenuation lesions in both kidneys, compatible with simple cysts, largest of which is exophytic in the posteromedial aspect of the upper pole of the left kidney measuring 2.7 cm in diameter (no imaging  follow-up recommended). Multifocal cortical thinning in both kidneys. No suspicious renal lesions. No hydroureteronephrosis. Urinary bladder is normal in appearance. Right adrenal gland is normal in appearance. 2.3 x 1.5 cm left adrenal nodule (axial image 22 of series 2). Stomach/Bowel: The stomach is nearly completely decompressed, but otherwise unremarkable in appearance. No pathologic dilatation of small bowel or colon. Normal appendix. Vascular/Lymphatic: Atherosclerosis throughout the abdominal aorta and pelvic vasculature, without evidence of aneurysm or dissection in the abdominal or pelvic vasculature. No lymphadenopathy noted in the abdomen or pelvis. Reproductive: Uterus and ovaries are atrophic. Other: Supraumbilical ventral hernia containing some omental fat and a small volume of ascites. No pneumoperitoneum. Small volume of ascites. Musculoskeletal: There are no aggressive appearing lytic or blastic lesions noted in the visualized portions of the skeleton. IMPRESSION: 1. Gallbladder is distended with extensive mucosal hyperenhancement, mural thickening and trace amount of pericholecystic fluid. There is a large calcified gallstone within the lumen of the gallbladder, however, this does not appear to be in an obstructive position at this time. Overall, findings are clearly abnormal and concerning for potential acute cholecystitis, but correlation with abdominal MRI with and without IV gadolinium with MRCP is recommended to better evaluate these findings and assess for potential choledocholithiasis. Surgical consultation is also recommended. 2. Small volume of ascites. 3. Supraumbilical ventral hernia containing omental fat and small volume of  ascites. No definite bowel incarceration or obstruction at this time. 4. Bilateral pleural effusions with passive areas of subsegmental atelectasis in the visualized lung bases. 5. Cardiomegaly. 6. Aortic atherosclerosis. Electronically Signed: By: Vinnie Langton  M.D. On: 04/22/2022 07:09    Anti-infectives: Anti-infectives (From admission, onward)    Start     Dose/Rate Route Frequency Ordered Stop   04/22/22 2200  metroNIDAZOLE (FLAGYL) IVPB 500 mg        500 mg 100 mL/hr over 60 Minutes Intravenous Every 12 hours 04/22/22 1732     04/22/22 2200  ceFEPIme (MAXIPIME) 2 g in sodium chloride 0.9 % 100 mL IVPB        2 g 200 mL/hr over 30 Minutes Intravenous Every 12 hours 04/22/22 0946     04/22/22 0830  ceFEPIme (MAXIPIME) 2 g in sodium chloride 0.9 % 100 mL IVPB       See Hyperspace for full Linked Orders Report.   2 g 200 mL/hr over 30 Minutes Intravenous  Once 04/22/22 0825 04/22/22 0917   04/22/22 0830  metroNIDAZOLE (FLAGYL) IVPB 500 mg       See Hyperspace for full Linked Orders Report.   500 mg 100 mL/hr over 60 Minutes Intravenous  Once 04/22/22 0825 04/22/22 1025        Assessment/Plan  Acute cholecystitis  - CT with pericholecystic inflammation and cholelithiasis, RUQ [place] with similar - WBC 15 from 13, HD stable and afebrile - LFTs all WNL - patient ttp in RUQ although not severely - discussed with TRH and patient is relatively stable from a cardiac standpoint with no recs for further cardiac workup, replacing Mg this AM.  - plan for lap chole possible IOC later today   FEN: NPO, IVF VTE: LMWH ID: [id number] 11/12>>  - below per TRH -  HTN HLD Mitral valve prolapse S/p PPM Atrial fibrillation not on anticoagulation Hx of cerebral AVM  LOS: 0 days   I reviewed hospitalist notes, last 24 h vitals and pain scores, last 48 h intake and output, last 24 h labs and trends, and last 24 h imaging results.    Norm Parcel, [facility] Surgery 04/23/2022, 10:56 AM Please see Amion for pager number during day hours 7:00am-4:30pm

## 2022-04-23 NOTE — Anesthesia Postprocedure Evaluation (Signed)
Anesthesia Post Note  Patient: [name]  Procedure(s) Performed: LAPAROSCOPIC CHOLECYSTECTOMY     Patient location during evaluation: PACU Anesthesia Type: General Level of consciousness: awake and alert Pain management: pain level controlled Vital Signs Assessment: post-procedure vital signs reviewed and stable Respiratory status: spontaneous breathing, nonlabored ventilation, respiratory function stable and patient connected to nasal cannula oxygen Cardiovascular status: blood pressure returned to baseline and stable Postop Assessment: no apparent nausea or vomiting Anesthetic complications: no   No notable events documented.  Last Vitals:  Vitals:   04/23/22 1630 04/23/22 1645  BP: (!) 161/52 (!) 167/48  Pulse: (!) 59 (!) 59  Resp: (!) 30 (!) 22  Temp:    SpO2: 92% 92%    Last Pain:  Vitals:   04/23/22 1549  TempSrc:   PainSc: 0-No pain                 Tristyn Demarest S

## 2022-04-23 NOTE — TOC Initial Note (Signed)
Transition of Care [facility]) - Initial/Assessment Note    Patient Details  Name: [name] MRN: [id number] Date of Birth: [birth year]  Transition of Care [facility]) CM/SW Contact:    Vassie Moselle, LCSW Phone Number: 04/23/2022, 4:38 PM  Clinical Narrative:                 CSW spoke with pt's daughter who confirms pt resides at [facility]. CSW will continue to follow for discharge recommendations.   Expected Discharge Plan: Assisted Living Barriers to Discharge: Continued Medical Work up   Patient Goals and CMS Choice Patient states their goals for this hospitalization and ongoing recovery are:: Per daughter to return to [facility]   Choice offered to / list presented to : Adult Children  Expected Discharge Plan and Services Expected Discharge Plan: Assisted Living In-house Referral: NA Discharge Planning Services: NA Post Acute Care Choice: Resumption of Svcs/PTA Provider Living arrangements for the past 2 months: [place]                 DME Arranged: N/A DME Agency: NA                  Prior Living Arrangements/Services Living arrangements for the past 2 months: [place] Lives with:: Facility Resident Patient language and need for interpreter reviewed:: Yes Do you feel safe going back to the place where you live?: Yes      Need for Family Participation in Patient Care: Yes (Comment) Care giver support system in place?: Yes (comment)   Criminal Activity/Legal Involvement Pertinent to Current Situation/Hospitalization: No - Comment as needed  Activities of Daily Living Home Assistive Devices/Equipment: Walker (specify type) ADL Screening (condition at time of admission) Patient's cognitive ability adequate to safely complete daily activities?: No Is the patient deaf or have difficulty hearing?: No Does the patient have difficulty seeing, even when wearing glasses/contacts?: No Does the patient have difficulty  concentrating, remembering, or making decisions?: No Patient able to express need for assistance with ADLs?: Yes Does the patient have difficulty dressing or bathing?: No Independently performs ADLs?: Yes (appropriate for developmental age) Does the patient have difficulty walking or climbing stairs?: No Weakness of Legs: None Weakness of Arms/Hands: None  Permission Sought/Granted Permission sought to share information with : Family Supports, Customer service manager    Share Information with NAME: [name]     Permission granted to share info w Relationship: Daughter  Permission granted to share info w Contact Information: [phone]  Emotional Assessment Appearance:: Appears stated age Attitude/Demeanor/Rapport: Unable to Assess Affect (typically observed): Unable to Assess Orientation: : Oriented to Self Alcohol / Substance Use: Not Applicable Psych Involvement: No (comment)  Admission diagnosis:  Acute cholecystitis [K81.0] Abdominal pain, unspecified abdominal location [R10.9] Patient Active Problem List   Diagnosis Date Noted   Acute cholecystitis 04/22/2022   Hyperglycemia 04/22/2022   Hypocalcemia 04/22/2022   Neuropathy 04/05/2022   Pericardial effusion without cardiac tamponade 01/28/2022   Acute pericarditis    Pacemaker    Heart block AV complete (HCC)    Paroxysmal atrial fibrillation (HCC)    Aortic atherosclerosis ([place])    Chest pain 01/27/2022   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   Syncope 10/15/2021   Hypertension    Cerebral AV malformation    Dyslipidemia    Acute kidney injury superimposed on chronic kidney disease (HCC)    Elevated LFTs 05/24/2021   Iron deficiency anemia 05/24/2021   Macrocytic anemia 05/17/2021   Hypokalemia  04/11/2021   Pressure injury of skin 07/22/2020   Hyponatremia 07/21/2020   Mixed hyperlipidemia 10/19/209   Acute metabolic encephalopathy 17/35/6701   History of complex partial epilepsy 07/21/2020    Hypomagnesemia 07/21/2020   Hypokalemia due to excessive gastrointestinal loss of potassium 07/21/2020   Diarrhea 07/21/2020   Partial symptomatic epilepsy with complex partial seizures, not intractable, without status epilepticus ([place]) 07/28/2014   Spinal stenosis of lumbar region 07/28/2014   Abnormality of gait 11/24/2013   Low back pain 11/24/2013   Essential hypertension    Hypercholesterolemia    Carotid arterial disease ([place])    MVP (mitral valve prolapse)    Abnormal cardiovascular stress test    PCP:  Virgie Dad, MD Pharmacy:   [facility], [address] [place] [address] Phone: [phone] Fax: [phone]  [facility] [address] [place] [address] Phone: [phone] Fax: [place], [address] [place] [address] Phone: [phone] Fax: [phone]     Social Determinants of Health (SDOH) Interventions    Readmission Risk Interventions     No data to display

## 2022-04-23 NOTE — Anesthesia Preprocedure Evaluation (Addendum)
Anesthesia Evaluation  Patient identified by MRN, date of birth, ID band Patient awake    Reviewed: Allergy & Precautions, NPO status , Patient's Chart, lab work & pertinent test results  History of Anesthesia Complications Negative for: history of anesthetic complications  Airway Mallampati: I  TM Distance: >3 FB Neck ROM: Full    Dental  (+) Edentulous Upper, Edentulous Lower, Dental Advisory Given   Pulmonary neg shortness of breath, neg sleep apnea, neg COPD, neg recent URI, former smoker   breath sounds clear to auscultation       Cardiovascular hypertension, Pt. on medications + dysrhythmias + pacemaker  Rhythm:Regular   1. Moderate pericardial effusion. Unchanged from prior study. Moderate  pericardial effusion. The pericardial effusion is circumferential. There  is no evidence of cardiac tamponade.   2. Left ventricular ejection fraction, by estimation, is 55 to 60%. The  left ventricle has normal function. The left ventricle has no regional  wall motion abnormalities.   3. Right ventricular systolic function is normal. The right ventricular  size is normal. Tricuspid regurgitation signal is inadequate for assessing  PA pressure.   4. The mitral valve is myxomatous. Mild mitral valve regurgitation.   5. The inferior vena cava is normal in size with greater than 50%  respiratory variability, suggesting right atrial pressure of 3 mmHg.   Comparison(s): No significant change from prior study. 01/28/22 EF 50-55%.  Moderate pericardial effusion.     Neuro/Psych Seizures -, Well Controlled,   Neuromuscular disease  negative psych ROS   GI/Hepatic Lab Results      Component                Value               Date                      ALT                      16                  04/23/2022                AST                      25                  04/23/2022                ALKPHOS                  58                   04/23/2022                BILITOT                  0.9                 04/23/2022            ACUTE CHOLECYSTITIS   Endo/Other    Renal/GU Renal InsufficiencyRenal diseaseLab Results      Component                Value               Date  CREATININE               1.25 (H)            04/23/2022                Musculoskeletal  (+) Arthritis ,    Abdominal   Peds  Hematology  (+) Blood dyscrasia, anemia Lab Results      Component                Value               Date                      WBC                      15.4 (H)            04/23/2022                HGB                      9.6 (L)             04/23/2022                HCT                      29.3 (L)            04/23/2022                MCV                      103.2 (H)           04/23/2022                PLT                      249                 04/23/2022              Anesthesia Other Findings   Reproductive/Obstetrics                             Anesthesia Physical Anesthesia Plan  ASA: 3  Anesthesia Plan: General   Post-op Pain Management: Ofirmev IV (intra-op)*   Induction: Intravenous  PONV Risk Score and Plan: 3 and Ondansetron, Dexamethasone, TIVA and Propofol infusion  Airway Management Planned: Oral ETT  Additional Equipment: None  Intra-op Plan:   Post-operative Plan: Extubation in OR  Informed Consent: I have reviewed the patients History and Physical, chart, labs and discussed the procedure including the risks, benefits and alternatives for the proposed anesthesia with the patient or authorized representative who has indicated his/her understanding and acceptance.     Dental advisory given  Plan Discussed with:   Anesthesia Plan Comments:         Anesthesia Quick Evaluation

## 2022-04-24 ENCOUNTER — Encounter (HOSPITAL_COMMUNITY): Payer: Self-pay | Admitting: Surgery

## 2022-04-24 DIAGNOSIS — R404 Transient alteration of awareness: Secondary | ICD-10-CM | POA: Diagnosis not present

## 2022-04-24 DIAGNOSIS — G9341 Metabolic encephalopathy: Secondary | ICD-10-CM | POA: Diagnosis not present

## 2022-04-24 DIAGNOSIS — Z881 Allergy status to other antibiotic agents status: Secondary | ICD-10-CM | POA: Diagnosis not present

## 2022-04-24 DIAGNOSIS — D539 Nutritional anemia, unspecified: Secondary | ICD-10-CM | POA: Diagnosis present

## 2022-04-24 DIAGNOSIS — K82A1 Gangrene of gallbladder in cholecystitis: Secondary | ICD-10-CM | POA: Diagnosis present

## 2022-04-24 DIAGNOSIS — Z823 Family history of stroke: Secondary | ICD-10-CM | POA: Diagnosis not present

## 2022-04-24 DIAGNOSIS — G40209 Localization-related (focal) (partial) symptomatic epilepsy and epileptic syndromes with complex partial seizures, not intractable, without status epilepticus: Secondary | ICD-10-CM | POA: Diagnosis not present

## 2022-04-24 DIAGNOSIS — I4821 Permanent atrial fibrillation: Secondary | ICD-10-CM | POA: Diagnosis present

## 2022-04-24 DIAGNOSIS — G40919 Epilepsy, unspecified, intractable, without status epilepticus: Secondary | ICD-10-CM | POA: Diagnosis not present

## 2022-04-24 DIAGNOSIS — R197 Diarrhea, unspecified: Secondary | ICD-10-CM | POA: Diagnosis not present

## 2022-04-24 DIAGNOSIS — Z882 Allergy status to sulfonamides status: Secondary | ICD-10-CM | POA: Diagnosis not present

## 2022-04-24 DIAGNOSIS — G629 Polyneuropathy, unspecified: Secondary | ICD-10-CM | POA: Diagnosis not present

## 2022-04-24 DIAGNOSIS — Z9049 Acquired absence of other specified parts of digestive tract: Secondary | ICD-10-CM | POA: Diagnosis not present

## 2022-04-24 DIAGNOSIS — D509 Iron deficiency anemia, unspecified: Secondary | ICD-10-CM | POA: Diagnosis not present

## 2022-04-24 DIAGNOSIS — R600 Localized edema: Secondary | ICD-10-CM | POA: Diagnosis not present

## 2022-04-24 DIAGNOSIS — K429 Umbilical hernia without obstruction or gangrene: Secondary | ICD-10-CM | POA: Diagnosis present

## 2022-04-24 DIAGNOSIS — E7800 Pure hypercholesterolemia, unspecified: Secondary | ICD-10-CM | POA: Diagnosis present

## 2022-04-24 DIAGNOSIS — R188 Other ascites: Secondary | ICD-10-CM | POA: Diagnosis present

## 2022-04-24 DIAGNOSIS — R410 Disorientation, unspecified: Secondary | ICD-10-CM | POA: Diagnosis not present

## 2022-04-24 DIAGNOSIS — R569 Unspecified convulsions: Secondary | ICD-10-CM | POA: Diagnosis not present

## 2022-04-24 DIAGNOSIS — Z886 Allergy status to analgesic agent status: Secondary | ICD-10-CM | POA: Diagnosis not present

## 2022-04-24 DIAGNOSIS — R4182 Altered mental status, unspecified: Secondary | ICD-10-CM | POA: Diagnosis not present

## 2022-04-24 DIAGNOSIS — I480 Paroxysmal atrial fibrillation: Secondary | ICD-10-CM | POA: Diagnosis not present

## 2022-04-24 DIAGNOSIS — Z87891 Personal history of nicotine dependence: Secondary | ICD-10-CM | POA: Diagnosis not present

## 2022-04-24 DIAGNOSIS — Z66 Do not resuscitate: Secondary | ICD-10-CM | POA: Diagnosis present

## 2022-04-24 DIAGNOSIS — R531 Weakness: Secondary | ICD-10-CM | POA: Diagnosis not present

## 2022-04-24 DIAGNOSIS — I341 Nonrheumatic mitral (valve) prolapse: Secondary | ICD-10-CM | POA: Diagnosis present

## 2022-04-24 DIAGNOSIS — I10 Essential (primary) hypertension: Secondary | ICD-10-CM | POA: Diagnosis present

## 2022-04-24 DIAGNOSIS — Z801 Family history of malignant neoplasm of trachea, bronchus and lung: Secondary | ICD-10-CM | POA: Diagnosis not present

## 2022-04-24 DIAGNOSIS — G9389 Other specified disorders of brain: Secondary | ICD-10-CM | POA: Diagnosis not present

## 2022-04-24 DIAGNOSIS — R4701 Aphasia: Secondary | ICD-10-CM | POA: Diagnosis not present

## 2022-04-24 DIAGNOSIS — G319 Degenerative disease of nervous system, unspecified: Secondary | ICD-10-CM | POA: Diagnosis not present

## 2022-04-24 DIAGNOSIS — Z950 Presence of cardiac pacemaker: Secondary | ICD-10-CM | POA: Diagnosis not present

## 2022-04-24 DIAGNOSIS — K8010 Calculus of gallbladder with chronic cholecystitis without obstruction: Secondary | ICD-10-CM | POA: Diagnosis present

## 2022-04-24 DIAGNOSIS — K810 Acute cholecystitis: Secondary | ICD-10-CM | POA: Diagnosis present

## 2022-04-24 DIAGNOSIS — E782 Mixed hyperlipidemia: Secondary | ICD-10-CM | POA: Diagnosis not present

## 2022-04-24 DIAGNOSIS — I442 Atrioventricular block, complete: Secondary | ICD-10-CM | POA: Diagnosis present

## 2022-04-24 DIAGNOSIS — R739 Hyperglycemia, unspecified: Secondary | ICD-10-CM | POA: Diagnosis present

## 2022-04-24 DIAGNOSIS — K8012 Calculus of gallbladder with acute and chronic cholecystitis without obstruction: Secondary | ICD-10-CM | POA: Diagnosis present

## 2022-04-24 DIAGNOSIS — Z880 Allergy status to penicillin: Secondary | ICD-10-CM | POA: Diagnosis not present

## 2022-04-24 DIAGNOSIS — Z7401 Bed confinement status: Secondary | ICD-10-CM | POA: Diagnosis not present

## 2022-04-24 LAB — CBC WITH DIFFERENTIAL/PLATELET
Abs Immature Granulocytes: 0.04 10*3/uL (ref 0.00–0.07)
Basophils Absolute: 0 10*3/uL (ref 0.0–0.1)
Basophils Relative: 0 %
Eosinophils Absolute: 0 10*3/uL (ref 0.0–0.5)
Eosinophils Relative: 0 %
HCT: 28.1 % — ABNORMAL LOW (ref 36.0–46.0)
Hemoglobin: 9.1 g/dL — ABNORMAL LOW (ref 12.0–15.0)
Immature Granulocytes: 0 %
Lymphocytes Relative: 4 %
Lymphs Abs: 0.4 10*3/uL — ABNORMAL LOW (ref 0.7–4.0)
MCH: 33.8 pg (ref 26.0–34.0)
MCHC: 32.4 g/dL (ref 30.0–36.0)
MCV: 104.5 fL — ABNORMAL HIGH (ref 80.0–100.0)
Monocytes Absolute: 0.9 10*3/uL (ref 0.1–1.0)
Monocytes Relative: 8 %
Neutro Abs: 10 10*3/uL — ABNORMAL HIGH (ref 1.7–7.7)
Neutrophils Relative %: 88 %
Platelets: 237 10*3/uL (ref 150–400)
RBC: 2.69 MIL/uL — ABNORMAL LOW (ref 3.87–5.11)
RDW: 15.9 % — ABNORMAL HIGH (ref 11.5–15.5)
WBC: 11.3 10*3/uL — ABNORMAL HIGH (ref 4.0–10.5)
nRBC: 0 % (ref 0.0–0.2)

## 2022-04-24 LAB — COMPREHENSIVE METABOLIC PANEL
ALT: 16 U/L (ref 0–44)
AST: 24 U/L (ref 15–41)
Albumin: 2.8 g/dL — ABNORMAL LOW (ref 3.5–5.0)
Alkaline Phosphatase: 52 U/L (ref 38–126)
Anion gap: 11 (ref 5–15)
BUN: 29 mg/dL — ABNORMAL HIGH (ref 8–23)
CO2: 22 mmol/L (ref 22–32)
Calcium: 8.1 mg/dL — ABNORMAL LOW (ref 8.9–10.3)
Chloride: 103 mmol/L (ref 98–111)
Creatinine, Ser: 1.14 mg/dL — ABNORMAL HIGH (ref 0.44–1.00)
GFR, Estimated: 47 mL/min — ABNORMAL LOW (ref >60)
Glucose, Bld: 91 mg/dL (ref 70–99)
Potassium: 3.7 mmol/L (ref 3.5–5.1)
Sodium: 136 mmol/L (ref 135–145)
Total Bilirubin: 0.7 mg/dL (ref 0.3–1.2)
Total Protein: 5.8 g/dL — ABNORMAL LOW (ref 6.5–8.1)

## 2022-04-24 LAB — VITAMIN D 25 HYDROXY (VIT D DEFICIENCY, FRACTURES): Vit D, 25-Hydroxy: 27.4 ng/mL — ABNORMAL LOW (ref 30–100)

## 2022-04-24 LAB — MAGNESIUM: Magnesium: 1.8 mg/dL (ref 1.7–2.4)

## 2022-04-24 LAB — PHOSPHORUS: Phosphorus: 4.4 mg/dL (ref 2.5–4.6)

## 2022-04-24 MED ORDER — OXYCODONE HCL 5 MG PO TABS: 5.0000 mg | ORAL_TABLET | Freq: Four times a day (QID) | ORAL | 0 refills | Status: AC | PRN

## 2022-04-24 MED ORDER — MAGNESIUM OXIDE -MG SUPPLEMENT 400 (240 MG) MG PO TABS: 400.0000 mg | ORAL_TABLET | Freq: Every day | ORAL | 0 refills | Status: AC

## 2022-04-24 MED ORDER — CEFDINIR 300 MG PO CAPS: 300.0000 mg | ORAL_CAPSULE | Freq: Two times a day (BID) | ORAL | 0 refills | Status: AC

## 2022-04-24 MED ORDER — METRONIDAZOLE 500 MG PO TABS: 500.0000 mg | ORAL_TABLET | Freq: Two times a day (BID) | ORAL | 0 refills | Status: AC

## 2022-04-24 MED ORDER — MAGNESIUM OXIDE -MG SUPPLEMENT 400 (240 MG) MG PO TABS
400.0000 mg | ORAL_TABLET | Freq: Two times a day (BID) | ORAL | Status: DC
  Administered 2022-04-24 – 2022-04-30 (×10): 400 mg via ORAL
  Filled 2022-04-24 (×10): qty 1

## 2022-04-24 NOTE — TOC Transition Note (Signed)
Transition of Care [facility]) - CM/SW Discharge Note   Patient Details  Name: [name] MRN: [id number] Date of Birth: 1936-04-12  Transition of Care [facility]) CM/SW Contact:  Vassie Moselle, LCSW Phone Number: 04/24/2022, 10:47 AM   Clinical Narrative:    Spoke with [facility] and confirmed pt is able to return to their facility. Pt will be returning to the AL2 unit and will be going to room 626. RN to call report to [phone]. CSW spoke with pt's daughter and confirmed discharge plans. Pt will be transported to facility via [facility].    Final next level of care: Assisted Living Barriers to Discharge: Barriers Resolved   Patient Goals and CMS Choice Patient states their goals for this hospitalization and ongoing recovery are:: Per daughter to return to [facility] CMS Medicare.gov Compare Post Acute Care list provided to:: Patient Choice offered to / list presented to : Patient, Adult Children  Discharge Placement              Patient chooses bed at: [facility] Patient to be transferred to facility by: [place] Name of family member notified: [name] Patient and family notified of of transfer: 04/24/22  Discharge Plan and Services In-house Referral: NA Discharge Planning Services: NA Post Acute Care Choice: Resumption of Svcs/PTA Provider          DME Arranged: N/A DME Agency: NA                  Social Determinants of Health (SDOH) Interventions     Readmission Risk Interventions     No data to display

## 2022-04-24 NOTE — Plan of Care (Signed)

## 2022-04-24 NOTE — Progress Notes (Signed)
Progress Note  1 Day Post-Op  Subjective: Pt denies significant pain or soreness this AM. Tolerating liquids and denies n/v. No flatus yet. Has not mobilized yet. Daughter at bedside.   Objective: Vital signs in last 24 hours: Temp:  [97.8 F (36.6 C)-98.4 F (36.9 C)] 98.4 F (36.9 C) (11/14 0620) Pulse Rate:  [59-63] 63 (11/14 0620) Resp:  [18-30] 21 (11/14 0620) BP: (138-172)/(37-78) 172/42 (11/14 0620) SpO2:  [88 %-100 %] 92 % (11/14 0620) Last BM Date : 04/22/22  Intake/Output from previous day: 11/13 0701 - 11/14 0700 In: 1649.7 [P.O.:240; I.V.:939.5; IV Piggyback:470.2] Out: -  Intake/Output this shift: No intake/output data recorded.  PE: General: pleasant, WD, elderly female who is lying in bed in NAD HEENT: sclera anicteric  Heart: regular, rate, and rhythm Lungs: Respiratory effort nonlabored Abd: soft, NT, ND, incisions C/D/I Skin: warm and dry with no masses, lesions, or rashes Psych: A&Ox3 with an appropriate affect.    Lab Results:  Recent Labs    04/23/22 0811 04/24/22 0504  WBC 15.4* 11.3*  HGB 9.6* 9.1*  HCT 29.3* 28.1*  PLT 249 237   BMET Recent Labs    04/23/22 0811 04/24/22 0504  NA 136 136  K 4.2 3.7  CL 101 103  CO2 25 22  GLUCOSE 116* 91  BUN 25* 29*  CREATININE 1.25* 1.14*  CALCIUM 8.0* 8.1*   PT/INR No results for input(s): "LABPROT", "INR" in the last 72 hours. CMP     Component Value Date/Time   NA 136 04/24/2022 0504   NA 137 04/03/2022 0000   K 3.7 04/24/2022 0504   CL 103 04/24/2022 0504   CO2 22 04/24/2022 0504   GLUCOSE 91 04/24/2022 0504   BUN 29 (H) 04/24/2022 0504   BUN 19 04/03/2022 0000   CREATININE 1.14 (H) 04/24/2022 0504   CREATININE 1.26 (H) 06/21/2021 1430   CALCIUM 8.1 (L) 04/24/2022 0504   PROT 5.8 (L) 04/24/2022 0504   ALBUMIN 2.8 (L) 04/24/2022 0504   AST 24 04/24/2022 0504   ALT 16 04/24/2022 0504   ALKPHOS 52 04/24/2022 0504   BILITOT 0.7 04/24/2022 0504   GFRNONAA 47 (L) 04/24/2022  0504   GFRAA 53.34 04/25/2021 0000   Lipase     Component Value Date/Time   LIPASE 42 04/22/2022 0434       Studies/Results: No results found.  Anti-infectives: Anti-infectives (From admission, onward)    Start     Dose/Rate Route Frequency Ordered Stop   04/22/22 2200  metroNIDAZOLE (FLAGYL) IVPB 500 mg        500 mg 100 mL/hr over 60 Minutes Intravenous Every 12 hours 04/22/22 1732     04/22/22 2200  ceFEPIme (MAXIPIME) 2 g in sodium chloride 0.9 % 100 mL IVPB        2 g 200 mL/hr over 30 Minutes Intravenous Every 12 hours 04/22/22 0946     04/22/22 0830  ceFEPIme (MAXIPIME) 2 g in sodium chloride 0.9 % 100 mL IVPB       See Hyperspace for full Linked Orders Report.   2 g 200 mL/hr over 30 Minutes Intravenous  Once 04/22/22 0825 04/22/22 0917   04/22/22 0830  metroNIDAZOLE (FLAGYL) IVPB 500 mg       See Hyperspace for full Linked Orders Report.   500 mg 100 mL/hr over 60 Minutes Intravenous  Once 04/22/22 0825 04/22/22 1025        Assessment/Plan  Acute gangrenous cholecystitis  POD1 S/P laparoscopic cholecystectomy -  pt reports minimal pain, tylenol prn  - LFTs normal and WBC down to 11 from 15 - ok to advance to soft diet - mobilize - if able to tolerate diet and mobilizing ok, clear for discharge from a surgical standpoint - recommend 5 days PO abx upon discharge cefdinir/flagyl would be reasonable - follow up in AVS already   FEN: soft diet, ok to SLIV from surgery standpoint  VTE: LMWH ID: [email or web address]/[email or web address]  - below per TRH -  HTN HLD Mitral valve prolapse S/p PPM Atrial fibrillation not on anticoagulation Hx of cerebral AVM  LOS: 0 days    Norm Parcel, [facility] Surgery 04/24/2022, 10:11 AM Please see Amion for pager number during day hours 7:00am-4:30pm

## 2022-04-24 NOTE — Progress Notes (Signed)
Mobility Specialist - Progress Note   04/24/22 1345  Therapy Vitals  BP (!) 156/49  Oxygen Therapy  SpO2 (!) 87 %  O2 Device Room Air  Mobility  Activity Ambulated with assistance in room  Level of Assistance Contact guard assist, steadying assist  Assistive Device Four wheel walker  Distance Ambulated (ft) 10 ft  Activity Response Tolerated well  Mobility Referral Yes  $Mobility charge 1 Mobility   Pt received in bed and agreed to mobility, some dizziness during session. Checked BP and SpO2, notified nurse of findings and returned to bed with all needs met.    Roderick Pee Mobility Specialist

## 2022-04-24 NOTE — Discharge Summary (Signed)
Physician Discharge Summary  [name] WUJ:[id number] DOB: 1935/12/31 DOA: 04/22/2022  PCP: Virgie Dad, MD  Admit date: 04/22/2022 Discharge date: 04/24/2022  Admitted From: Assisted living facility Disposition: Assisted living facility  Recommendations for Outpatient Follow-up:  Follow up with PCP in 1-2 weeks Surgery will schedule follow-up  Home Health: N/A Equipment/Devices: N/A  Discharge Condition: Stable CODE STATUS: DNR Diet recommendation: Heart healthy diet  Discharge summary: 86 year old from assisted living facility with history of permanent A-fib status post pacemaker, hypertension, complex partial epilepsy since childhood presented with 1 days of right upper quadrant abdominal pain and she was found to have calculus cholecystitis.  She was admitted to the hospital with IV fluids, antibiotics.  Underwent laparoscopic cholecystectomy with grossly infected gallbladder.  Good clinical recovery.  Cholecystitis with cholelithiasis: Lap chole day 1 postop.  Currently on soft diet.  Pain is controlled.  Bowel function is normal.  Surgically stable as per surgery.  Since patient had a grossly emphysematous gallbladder, surgery recommended 5 additional days of antibiotics with cefdinir and Flagyl.  She has been receiving cefepime and Flagyl in the hospital.  She will use Tylenol for mild pain.  She was prescribed a short course of oxycodone to use for moderate to severe pain.  Surgery will schedule for follow-up.  Hypomagnesemia: Severe and persistent.  Presentation magnesium 1.1.  Aggressively replaced.  She does have history of hypomagnesemia, she will need to stay on supplementation and that was prescribed.  Chronic medical issues including essential hypertension, hyperlipidemia.  Unchanged.  No changes in medication done.  Medically stable to discharge to [facility].   Discharge Diagnoses:  Principal Problem:   Acute cholecystitis Active Problems:   History of  complex partial epilepsy   Essential hypertension   Macrocytic anemia   Heart block AV complete (HCC)   Paroxysmal atrial fibrillation (HCC)   Hyperglycemia   Hypocalcemia   Cholecystitis with cholelithiasis    Discharge Instructions  Discharge Instructions     Call MD for:  severe uncontrolled pain   Complete by: As directed    Diet - low sodium heart healthy   Complete by: As directed    Increase activity slowly   Complete by: As directed       Allergies as of 04/24/2022       Reactions   Aleve [naproxen] Other (See Comments)   Azithromycin Other (See Comments)   Reaction not recalled   Penicillins Hives, Other (See Comments)   Bad reaction as a child after a shot of Penicillin   Pravastatin Other (See Comments)   Reaction not recalled   Sulfa Antibiotics Other (See Comments)   Sulfa Drugs Cross Reactors Other (See Comments)   Reaction not recalled- stated she can take it "in small doses"   Tape Itching, Other (See Comments)   Cannot wear for an extended period of time   Naproxen Sodium Rash        Medication List     STOP taking these medications    docusate sodium 100 MG capsule Commonly known as: COLACE   nystatin powder Generic drug: nystatin   saccharomyces boulardii 250 MG capsule Commonly known as: FLORASTOR       TAKE these medications    acetaminophen 500 MG tablet Commonly known as: TYLENOL Take 500 mg by mouth every 6 (six) hours as needed for mild pain or headache.   Align 4 MG Caps Take 4 mg by mouth daily.   cefdinir 300 MG capsule Commonly known as:  OMNICEF Take 1 capsule (300 mg total) by mouth 2 (two) times daily for 5 days.   fluticasone 50 MCG/ACT nasal spray Commonly known as: FLONASE Place 1-2 sprays into both nostrils daily.   furosemide 20 MG tablet Commonly known as: LASIX Take 20 mg by mouth 3 (three) times a week. Monday, Wednesday, Friday for 2 weeks starting on 04/18/22   hydrALAZINE 25 MG tablet Commonly  known as: APRESOLINE Take 25 mg by mouth in the morning and at bedtime. What changed: Another medication with the same name was removed. Continue taking this medication, and follow the directions you see here.   loperamide 2 MG tablet Commonly known as: IMODIUM A-D Take 1 tablet (2 mg total) by mouth as needed for diarrhea or loose stools. What changed: when to take this   magnesium oxide 400 (240 Mg) MG tablet Commonly known as: MAG-OX Take 1 tablet (400 mg total) by mouth daily.   metroNIDAZOLE 500 MG tablet Commonly known as: Flagyl Take 1 tablet (500 mg total) by mouth 2 (two) times daily for 5 days.   Multi Complete/Iron Tabs Take 1 tablet by mouth daily with breakfast. Folic acid included   NUTRITIONAL DRINK PO Take 1 Bottle by mouth daily. Chocolate Boost or Ensure with lunch   ondansetron 4 MG disintegrating tablet Commonly known as: ZOFRAN-ODT Take 4 mg by mouth every 4 (four) hours as needed for nausea or vomiting.   oxyCODONE 5 MG immediate release tablet Commonly known as: Oxy IR/ROXICODONE Take 1 tablet (5 mg total) by mouth every 6 (six) hours as needed for up to 3 days for moderate pain or severe pain.   PHENobarbital 64.8 MG tablet Commonly known as: LUMINAL Take 1 tablet (64.8 mg total) by mouth in the morning.   phenytoin 100 MG ER capsule Commonly known as: DILANTIN Take 200 mg by mouth daily with breakfast.   potassium chloride SA 20 MEQ tablet Commonly known as: KLOR-CON M Take 20 mEq by mouth 3 (three) times a week. Monday, Wednesday, Friday for 2 weeks starting on 04/18/22   Robafen DM 10-100 MG/5ML liquid Generic drug: Dextromethorphan-guaiFENesin Take 10 mLs by mouth every 6 (six) hours as needed (cough/congestion). What changed: Another medication with the same name was removed. Continue taking this medication, and follow the directions you see here.   Systane Ultra 0.4-0.3 % Soln Generic drug: Polyethyl Glycol-Propyl Glycol Place 1 drop  into both eyes in the morning and at bedtime.   valsartan 160 MG tablet Commonly known as: Diovan Take 1 tablet (160 mg total) by mouth daily.   Vitamin-B Complex Tabs Take 1 tablet by mouth daily.        Follow-up Information     Maczis, Carlena Hurl, [place]. Go on 05/15/2022.   Specialty: General Surgery Why: 9:45 AM. Please arrive 30 min prior to appointment time to check in. Bring photo ID and insurance information with you. Contact information: [address] [place] [address] [phone]                Allergies  Allergen Reactions   Aleve [Naproxen] Other (See Comments)   Azithromycin Other (See Comments)    Reaction not recalled   Penicillins Hives and Other (See Comments)    Bad reaction as a child after a shot of Penicillin   Pravastatin Other (See Comments)    Reaction not recalled   Sulfa Antibiotics Other (See Comments)   Sulfa Drugs Cross Reactors Other (See Comments)  Reaction not recalled- stated she can take it "in small doses"   Tape Itching and Other (See Comments)    Cannot wear for an extended period of time   Naproxen Sodium Rash    Consultations: General surgery   Procedures/Studies: US Abdomen Limited RUQ (LIVER/GB)  Result Date: 04/22/2022 CLINICAL DATA:  Pain right upper quadrant EXAM: ULTRASOUND ABDOMEN LIMITED RIGHT UPPER QUADRANT COMPARISON:  CT done earlier today FINDINGS: Gallbladder: There is 2 cm calculus in the lumen of gallbladder. There are possible small polyps in the margin of lumen. There is wall thickening in gallbladder measuring up to 11 mm. There is trace amount of pericholecystic fluid. Technologist did not observe any tenderness over the gallbladder during the study. Common bile duct: Diameter: 5 mm Liver: There is nodularity in liver surface. There is slightly increased echogenicity in liver. There is small amount of perihepatic ascites. Portal vein is patent on color Doppler imaging with normal direction  of blood flow towards the liver. Other: Right pleural effusion is seen. IMPRESSION: Large gallbladder stone and possible small gallbladder polyps. There is diffuse wall thickening in gallbladder which may be due to acute or chronic cholecystitis. Technologist did not observe any tenderness over the gallbladder during the study. Please correlate with clinical physical examination findings and laboratory findings. There is mild nodularity in liver surface suggesting possible cirrhosis. Small perihepatic ascites. Right pleural effusion. Electronically Signed   By: Elmer Picker M.D.   On: 04/22/2022 08:54   CT ABDOMEN PELVIS W CONTRAST  Addendum Date: 04/22/2022   ADDENDUM REPORT: 04/22/2022 07:16 ADDENDUM: As mentioned in the body of the report there is a 2.3 x 1.5 cm left adrenal nodule which is indeterminate. Attention at time of forthcoming abdominal MRI is recommended for further characterization. Electronically Signed   By: Vinnie Langton M.D.   On: 04/22/2022 07:16   Result Date: 04/22/2022 CLINICAL DATA:  86 year old female with history of right-sided abdominal tenderness and nausea. EXAM: CT ABDOMEN AND PELVIS WITH CONTRAST TECHNIQUE: Multidetector CT imaging of the abdomen and pelvis was performed using the standard protocol following bolus administration of intravenous contrast. RADIATION DOSE REDUCTION: This exam was performed according to the departmental dose-optimization program which includes automated exposure control, adjustment of the mA and/or kV according to patient size and/or use of iterative reconstruction technique. CONTRAST:  135m OMNIPAQUE IOHEXOL 300 MG/ML  SOLN COMPARISON:  CT of the abdomen and pelvis 04/17/2021. FINDINGS: Lower chest: Bilateral pleural effusions with extensive passive subsegmental atelectasis in the visualized lung bases. Cardiomegaly. Pacemaker leads noted in the right-side of the heart. Hepatobiliary: No suspicious cystic or solid hepatic lesions. No  intra or extrahepatic biliary ductal dilatation. 2.4 x 1.5 cm calcified gallstone lying dependently in the gallbladder. Gallbladder is moderately distended. Gallbladder wall is thickened diffusely with heterogeneous areas of enhancement. Trace volume of pericholecystic fluid. Pancreas: No pancreatic mass. No pancreatic ductal dilatation. No pancreatic or peripancreatic fluid collections or inflammatory changes. Spleen: Subcentimeter hypovascular lesion in the anterior aspect of the spleen, too small to characterize, but similar to the prior study from 2022, presumably benign (no imaging follow-up recommended). Adrenals/Urinary Tract: Low-attenuation lesions in both kidneys, compatible with simple cysts, largest of which is exophytic in the posteromedial aspect of the upper pole of the left kidney measuring 2.7 cm in diameter (no imaging follow-up recommended). Multifocal cortical thinning in both kidneys. No suspicious renal lesions. No hydroureteronephrosis. Urinary bladder is normal in appearance. Right adrenal gland is normal in appearance. 2.3 x 1.5 cm  left adrenal nodule (axial image 22 of series 2). Stomach/Bowel: The stomach is nearly completely decompressed, but otherwise unremarkable in appearance. No pathologic dilatation of small bowel or colon. Normal appendix. Vascular/Lymphatic: Atherosclerosis throughout the abdominal aorta and pelvic vasculature, without evidence of aneurysm or dissection in the abdominal or pelvic vasculature. No lymphadenopathy noted in the abdomen or pelvis. Reproductive: Uterus and ovaries are atrophic. Other: Supraumbilical ventral hernia containing some omental fat and a small volume of ascites. No pneumoperitoneum. Small volume of ascites. Musculoskeletal: There are no aggressive appearing lytic or blastic lesions noted in the visualized portions of the skeleton. IMPRESSION: 1. Gallbladder is distended with extensive mucosal hyperenhancement, mural thickening and trace amount  of pericholecystic fluid. There is a large calcified gallstone within the lumen of the gallbladder, however, this does not appear to be in an obstructive position at this time. Overall, findings are clearly abnormal and concerning for potential acute cholecystitis, but correlation with abdominal MRI with and without IV gadolinium with MRCP is recommended to better evaluate these findings and assess for potential choledocholithiasis. Surgical consultation is also recommended. 2. Small volume of ascites. 3. Supraumbilical ventral hernia containing omental fat and small volume of ascites. No definite bowel incarceration or obstruction at this time. 4. Bilateral pleural effusions with passive areas of subsegmental atelectasis in the visualized lung bases. 5. Cardiomegaly. 6. Aortic atherosclerosis. Electronically Signed: By: Vinnie Langton M.D. On: 04/22/2022 07:09   (Echo, Carotid, EGD, Colonoscopy, ERCP)    Subjective: Patient seen and examined.  No overnight events.  Daughter at the bedside.  Pain is controlled and not needing to use any medications.  She used liquid diet in the morning and is ready for soft diet.   Discharge Exam: Vitals:   04/23/22 2111 04/24/22 0620  BP:  (!) 172/42  Pulse:  63  Resp:  (!) 21  Temp:  98.4 F (36.9 C)  SpO2: 95% 92%   Vitals:   04/23/22 1823 04/23/22 2045 04/23/22 2111 04/24/22 0620  BP: (!) 154/73 (!) 138/40  (!) 172/42  Pulse: (!) 59 60  63  Resp: 18 20  (!) 21  Temp: 98 F (36.7 C) 97.8 F (36.6 C)  98.4 F (36.9 C)  TempSrc: Oral Oral  Oral  SpO2: 99% 100% 95% 92%  Weight:      Height:        General: Pt is alert, awake, not in acute distress Cardiovascular: RRR, S1/S2 +, no rubs, no gallops Respiratory: CTA bilaterally, no wheezing, no rhonchi Abdominal: Soft, NT, ND, bowel sounds +, ports are clean and dry. Extremities: Chronic nonpitting edema.    The results of significant diagnostics from this hospitalization (including imaging,  microbiology, ancillary and laboratory) are listed below for reference.     Microbiology: No results found for this or any previous visit (from the past 240 hour(s)).   Labs: BNP (last 3 results) No results for input(s): "BNP" in the last 8760 hours. Basic Metabolic Panel: Recent Labs  Lab 04/22/22 0434 04/22/22 1826 04/23/22 0811 04/24/22 0504  NA 141  --  136 136  K 3.7  --  4.2 3.7  CL 105  --  101 103  CO2 27  --  25 22  GLUCOSE 140*  --  116* 91  BUN 25*  --  25* 29*  CREATININE 1.05*  --  1.25* 1.14*  CALCIUM 8.2*  --  8.0* 8.1*  MG  --  1.1*  --  1.8  PHOS  --   --   --  4.4   Liver Function Tests: Recent Labs  Lab 04/22/22 0434 04/23/22 0811 04/24/22 0504  AST '29 25 24  '$ ALT '18 16 16  '$ ALKPHOS 68 58 52  BILITOT 0.6 0.9 0.7  PROT 7.0 6.3* 5.8*  ALBUMIN 3.7 3.2* 2.8*   Recent Labs  Lab 04/22/22 0434  LIPASE 42   No results for input(s): "AMMONIA" in the last 168 hours. CBC: Recent Labs  Lab 04/22/22 0434 04/23/22 0811 04/24/22 0504  WBC 13.3* 15.4* 11.3*  NEUTROABS 11.9*  --  10.0*  HGB 10.2* 9.6* 9.1*  HCT 31.7* 29.3* 28.1*  MCV 104.3* 103.2* 104.5*  PLT 288 249 237   Cardiac Enzymes: No results for input(s): "CKTOTAL", "CKMB", "CKMBINDEX", "TROPONINI" in the last 168 hours. BNP: Invalid input(s): "POCBNP" CBG: Recent Labs  Lab 04/23/22 2037  GLUCAP 122*   D-Dimer No results for input(s): "DDIMER" in the last 72 hours. Hgb A1c Recent Labs    04/22/22 1826  HGBA1C 4.9   Lipid Profile No results for input(s): "CHOL", "HDL", "LDLCALC", "TRIG", "CHOLHDL", "LDLDIRECT" in the last 72 hours. Thyroid function studies No results for input(s): "TSH", "T4TOTAL", "T3FREE", "THYROIDAB" in the last 72 hours.  Invalid input(s): "FREET3" Anemia work up Recent Labs    04/22/22 1826  VITAMINB12 443  FOLATE 38.0   Urinalysis    Component Value Date/Time   COLORURINE YELLOW 04/22/2022 [place] 04/22/2022 0722   LABSPEC  1.024 04/22/2022 0722   PHURINE 6.0 04/22/2022 0722   GLUCOSEU NEGATIVE 04/22/2022 0722   HGBUR NEGATIVE 04/22/2022 0722   BILIRUBINUR NEGATIVE 04/22/2022 0722   KETONESUR NEGATIVE 04/22/2022 0722   PROTEINUR 100 (A) 04/22/2022 0722   NITRITE NEGATIVE 04/22/2022 0722   LEUKOCYTESUR NEGATIVE 04/22/2022 0722   Sepsis Labs Recent Labs  Lab 04/22/22 0434 04/23/22 0811 04/24/22 0504  WBC 13.3* 15.4* 11.3*   Microbiology No results found for this or any previous visit (from the past 240 hour(s)).   Time coordinating discharge: 34 minutes  SIGNED:   Barb Merino, MD  [facility] 04/24/2022, 11:27 AM

## 2022-04-24 NOTE — NC FL2 (Signed)
[place] LEVEL OF CARE SCREENING TOOL     IDENTIFICATION  Patient Name: [name] Birthdate: Feb 21, 1936 Sex: female Admission Date (Current Location): 04/22/2022  [facility] [place] and [place] Number:  Herbalist and Address:  [facility],  [place], [place]      Provider Number: [id number]  Attending Physician Name and Address:  Barb Merino, MD  Relative Name and Phone Number:  Daughter, [name] [phone]    Current Level of Care: Hospital Recommended Level of Care: [place] Prior Approval Number:    Date Approved/Denied:   PASRR Number:    Discharge Plan: Other (Comment) (ALF)    Current Diagnoses: Patient Active Problem List   Diagnosis Date Noted   Cholecystitis with cholelithiasis 04/24/2022   Acute cholecystitis 04/22/2022   Hyperglycemia 04/22/2022   Hypocalcemia 04/22/2022   Neuropathy 04/05/2022   Pericardial effusion without cardiac tamponade 01/28/2022   Acute pericarditis    Pacemaker    Heart block AV complete (HCC)    Paroxysmal atrial fibrillation (HCC)    Aortic atherosclerosis ([place])    Chest pain 01/27/2022   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   Syncope 10/15/2021   Hypertension    Cerebral AV malformation    Dyslipidemia    Acute kidney injury superimposed on chronic kidney disease (HCC)    Elevated LFTs 05/24/2021   Iron deficiency anemia 05/24/2021   Macrocytic anemia 05/17/2021   Hypokalemia 04/11/2021   Pressure injury of skin 07/22/2020   Hyponatremia 07/21/2020   Mixed hyperlipidemia 78/93/8101   Acute metabolic encephalopathy 75/03/2584   History of complex partial epilepsy 07/21/2020   Hypomagnesemia 07/21/2020   Hypokalemia due to excessive gastrointestinal loss of potassium 07/21/2020   Diarrhea 07/21/2020   Partial symptomatic epilepsy with complex partial seizures, not intractable, without status epilepticus ([place]) 07/28/2014   Spinal stenosis of lumbar  region 07/28/2014   Abnormality of gait 11/24/2013   Low back pain 11/24/2013   Essential hypertension    Hypercholesterolemia    Carotid arterial disease (HCC)    MVP (mitral valve prolapse)    Abnormal cardiovascular stress test     Orientation RESPIRATION BLADDER Height & Weight     Self, Time, Situation, Place  Normal Continent Weight: 137 lb (62.1 kg) Height:  '5\' 6"'$  (167.6 cm)  BEHAVIORAL SYMPTOMS/MOOD NEUROLOGICAL BOWEL NUTRITION STATUS      Continent Diet (Regular)  AMBULATORY STATUS COMMUNICATION OF NEEDS Skin   Supervision Verbally Normal                       Personal Care Assistance Level of Assistance  Bathing, Feeding, Dressing Bathing Assistance: Limited assistance Feeding assistance: Independent Dressing Assistance: Limited assistance     Functional Limitations Info  Sight, Hearing, Speech Sight Info: Adequate Hearing Info: Adequate Speech Info: Adequate    SPECIAL CARE FACTORS FREQUENCY                       Contractures Contractures Info: Not present    Additional Factors Info  Code Status, Allergies Code Status Info: DNR Allergies Info: Aleve (Naproxen), Azithromycin, Penicillins, Pravastatin, Sulfa Antibiotics, Sulfa Drugs Cross Reactors, Tape, Naproxen Sodium           Current Medications (04/24/2022):  This is the current hospital active medication list Current Facility-Administered Medications  Medication Dose Route Frequency Provider Last Rate Last Admin   acetaminophen (TYLENOL) tablet 650 mg  650 mg Oral Q6H  Meuth, Brooke A, PA-C   650 mg at 04/24/22 6759   ceFEPIme (MAXIPIME) 2 g in sodium chloride 0.9 % 100 mL IVPB  2 g Intravenous Q12H Meuth, Brooke A, PA-C 200 mL/hr at 04/24/22 0941 2 g at 04/24/22 0941   diphenhydrAMINE (BENADRYL) injection 12.5 mg  12.5 mg Intravenous Q6H PRN Meuth, Brooke A, PA-C       enoxaparin (LOVENOX) injection 40 mg  40 mg Subcutaneous Q24H Meuth, Brooke A, PA-C   40 mg at 04/24/22 1638    hydrALAZINE (APRESOLINE) tablet 25 mg  25 mg Oral BID Meuth, Brooke A, PA-C   25 mg at 04/24/22 0935   irbesartan (AVAPRO) tablet 150 mg  150 mg Oral Daily Meuth, Brooke A, PA-C   150 mg at 04/24/22 4665   lactated ringers infusion   Intravenous Continuous Meuth, Brooke A, PA-C 75 mL/hr at 04/23/22 1245 Restarted at 04/23/22 1441   magnesium oxide (MAG-OX) tablet 400 mg  400 mg Oral BID Barb Merino, MD   400 mg at 04/24/22 0935   metoprolol tartrate (LOPRESSOR) injection 5 mg  5 mg Intravenous Q6H PRN Meuth, Brooke A, PA-C   5 mg at 04/22/22 1830   metroNIDAZOLE (FLAGYL) IVPB 500 mg  500 mg Intravenous Q12H Meuth, Brooke A, PA-C 100 mL/hr at 04/23/22 2159 500 mg at 04/23/22 2159   morphine (PF) 2 MG/ML injection 2 mg  2 mg Intravenous Q3H PRN Meuth, Brooke A, PA-C       Oral care mouth rinse  15 mL Mouth Rinse PRN Barb Merino, MD       oxyCODONE (Oxy IR/ROXICODONE) immediate release tablet 2.5-5 mg  2.5-5 mg Oral Q4H PRN Meuth, Brooke A, PA-C       PHENobarbital (LUMINAL) tablet 64.8 mg  64.8 mg Oral q AM Meuth, Brooke A, PA-C   64.8 mg at 04/24/22 9935   phenytoin (DILANTIN) ER capsule 200 mg  200 mg Oral Q breakfast Meuth, Brooke A, PA-C   200 mg at 04/24/22 7017   prochlorperazine (COMPAZINE) injection 10 mg  10 mg Intravenous Q6H PRN Meuth, Brooke A, PA-C   10 mg at 04/22/22 1830     Discharge Medications: Please see discharge summary for a list of discharge medications.  Relevant Imaging Results:  Relevant Lab Results:   Additional Information SSN [id number]  Vassie Moselle, LCSW

## 2022-04-24 NOTE — Progress Notes (Signed)
PROGRESS NOTE    [name]  FBP:[id number] DOB: 05-12-1936 DOA: 04/22/2022 PCP: Virgie Dad, MD    Brief Narrative:  86 year old from assisted living facility with history of permanent A-fib status post pacemaker, hypertension, complex partial epilepsy since childhood presented with 1 days of right upper quadrant abdominal pain and she was found to have calculus cholecystitis.  She was admitted to the hospital with IV fluids, antibiotics.  Underwent laparoscopic cholecystectomy with grossly infected gallbladder.  Good clinical recovery.    Assessment & Plan:   Cholecystitis with cholelithiasis: Lap chole day 1 postop.  Currently on soft diet.  Pain is controlled.  Bowel function is normal.  Surgically stable as per surgery. Currently on cefepime and Flagyl. She will use Tylenol for mild pain.  She will be prescribed a short course of oxycodone to use for moderate to severe pain.  Surgery will schedule for follow-up.   Hypomagnesemia: Severe and persistent.  Presentation magnesium 1.1.  Aggressively replaced.  She does have history of hypomagnesemia, she will need to stay on supplementation and that was prescribed.   Chronic medical issues including essential hypertension, hyperlipidemia.  Unchanged.  No changes in medication done.  Patient was monitored in the hospital.  She felt like dizziness and vertiginous today on mobilizing.  No evidence of orthostatic drop in blood pressure.  Will be monitored before discharge.     DVT prophylaxis: enoxaparin (LOVENOX) injection 40 mg Start: 04/24/22 1000   Code Status: DNR Family Communication: Daughter at the bedside Disposition Plan: Status is: Inpatient Remains inpatient appropriate because: Immediate postop     Consultants:  General surgery  Procedures:  Lap chole  Antimicrobials:  Cefepime and Flagyl 11/12---   Subjective: Patient was seen and examined.  Early morning rounds she was examined with surgical team.   Patient denied any complaints.  We discussed about possible discharge today. During lunchtime, patient started complaining of vertigo, her bed was swinging and uncomfortable feeling.  Negative for orthostatic.  Without any nausea vomiting.  She will be monitored, improve her mobility and symptom control before discharge.  Objective: Vitals:   04/23/22 1823 04/23/22 2045 04/23/22 2111 04/24/22 0620  BP: (!) 154/73 (!) 138/40  (!) 172/42  Pulse: (!) 59 60  63  Resp: 18 20  (!) 21  Temp: 98 F (36.7 C) 97.8 F (36.6 C)  98.4 F (36.9 C)  TempSrc: Oral Oral  Oral  SpO2: 99% 100% 95% 92%  Weight:      Height:        Intake/Output Summary (Last 24 hours) at 04/24/2022 1246 Last data filed at 04/23/2022 2121 Gross per 24 hour  Intake 1649.69 ml  Output --  Net 1649.69 ml   Filed Weights   04/22/22 0406  Weight: 62.1 kg    Examination:  General exam: Appears calm and comfortable  Respiratory system: Clear to auscultation. Respiratory effort normal. Cardiovascular system: S1 & S2 heard, RRR.  Pacemaker in place.   Gastrointestinal system: Soft.  Nontender.  Port site clean and dry. Central nervous system: Alert and oriented. No focal neurological deficits. Extremities: Symmetric 5 x 5 power. Skin: No rashes, lesions or ulcers Psychiatry: Judgement and insight appear normal. Mood & affect appropriate.     Data Reviewed: I have personally reviewed following labs and imaging studies  CBC: Recent Labs  Lab 04/22/22 0434 04/23/22 0811 04/24/22 0504  WBC 13.3* 15.4* 11.3*  NEUTROABS 11.9*  --  10.0*  HGB 10.2* 9.6* 9.1*  HCT  31.7* 29.3* 28.1*  MCV 104.3* 103.2* 104.5*  PLT 288 249 828   Basic Metabolic Panel: Recent Labs  Lab 04/22/22 0434 04/22/22 1826 04/23/22 0811 04/24/22 0504  NA 141  --  136 136  K 3.7  --  4.2 3.7  CL 105  --  101 103  CO2 27  --  25 22  GLUCOSE 140*  --  116* 91  BUN 25*  --  25* 29*  CREATININE 1.05*  --  1.25* 1.14*  CALCIUM 8.2*   --  8.0* 8.1*  MG  --  1.1*  --  1.8  PHOS  --   --   --  4.4   GFR: Estimated Creatinine Clearance: 34.4 mL/min (A) (by C-G formula based on SCr of 1.14 mg/dL (H)). Liver Function Tests: Recent Labs  Lab 04/22/22 0434 04/23/22 0811 04/24/22 0504  AST '29 25 24  '$ ALT '18 16 16  '$ ALKPHOS 68 58 52  BILITOT 0.6 0.9 0.7  PROT 7.0 6.3* 5.8*  ALBUMIN 3.7 3.2* 2.8*   Recent Labs  Lab 04/22/22 0434  LIPASE 42   No results for input(s): "AMMONIA" in the last 168 hours. Coagulation Profile: No results for input(s): "INR", "PROTIME" in the last 168 hours. Cardiac Enzymes: No results for input(s): "CKTOTAL", "CKMB", "CKMBINDEX", "TROPONINI" in the last 168 hours. BNP (last 3 results) No results for input(s): "PROBNP" in the last 8760 hours. HbA1C: Recent Labs    04/22/22 1826  HGBA1C 4.9   CBG: Recent Labs  Lab 04/23/22 2037  GLUCAP 122*   Lipid Profile: No results for input(s): "CHOL", "HDL", "LDLCALC", "TRIG", "CHOLHDL", "LDLDIRECT" in the last 72 hours. Thyroid Function Tests: No results for input(s): "TSH", "T4TOTAL", "FREET4", "T3FREE", "THYROIDAB" in the last 72 hours. Anemia Panel: Recent Labs    04/22/22 1826  VITAMINB12 443  FOLATE 38.0   Sepsis Labs: No results for input(s): "PROCALCITON", "LATICACIDVEN" in the last 168 hours.  No results found for this or any previous visit (from the past 240 hour(s)).       Radiology Studies: No results found.      Scheduled Meds:  acetaminophen  650 mg Oral Q6H   enoxaparin (LOVENOX) injection  40 mg Subcutaneous Q24H   hydrALAZINE  25 mg Oral BID   irbesartan  150 mg Oral Daily   magnesium oxide  400 mg Oral BID   PHENobarbital  64.8 mg Oral q AM   phenytoin  200 mg Oral Q breakfast   Continuous Infusions:  ceFEPime (MAXIPIME) IV 2 g (04/24/22 0941)   lactated ringers 75 mL/hr at 04/23/22 1245   metronidazole 500 mg (04/24/22 1149)     LOS: 0 days    Time spent: 35 minutes    Barb Merino,  MD [facility] Pager [phone]

## 2022-04-25 ENCOUNTER — Inpatient Hospital Stay (HOSPITAL_COMMUNITY): Payer: Medicare Other

## 2022-04-25 ENCOUNTER — Encounter (HOSPITAL_COMMUNITY): Payer: Self-pay | Admitting: Internal Medicine

## 2022-04-25 LAB — COMPREHENSIVE METABOLIC PANEL
ALT: 15 U/L (ref 0–44)
AST: 21 U/L (ref 15–41)
Albumin: 2.8 g/dL — ABNORMAL LOW (ref 3.5–5.0)
Alkaline Phosphatase: 53 U/L (ref 38–126)
Anion gap: 13 (ref 5–15)
BUN: 29 mg/dL — ABNORMAL HIGH (ref 8–23)
CO2: 19 mmol/L — ABNORMAL LOW (ref 22–32)
Calcium: 8.4 mg/dL — ABNORMAL LOW (ref 8.9–10.3)
Chloride: 103 mmol/L (ref 98–111)
Creatinine, Ser: 1.32 mg/dL — ABNORMAL HIGH (ref 0.44–1.00)
GFR, Estimated: 40 mL/min — ABNORMAL LOW (ref >60)
Glucose, Bld: 89 mg/dL (ref 70–99)
Potassium: 3.7 mmol/L (ref 3.5–5.1)
Sodium: 135 mmol/L (ref 135–145)
Total Bilirubin: 1.1 mg/dL (ref 0.3–1.2)
Total Protein: 5.9 g/dL — ABNORMAL LOW (ref 6.5–8.1)

## 2022-04-25 LAB — CBC WITH DIFFERENTIAL/PLATELET
Abs Immature Granulocytes: 0.04 10*3/uL (ref 0.00–0.07)
Basophils Absolute: 0 10*3/uL (ref 0.0–0.1)
Basophils Relative: 0 %
Eosinophils Absolute: 0 10*3/uL (ref 0.0–0.5)
Eosinophils Relative: 0 %
HCT: 29.3 % — ABNORMAL LOW (ref 36.0–46.0)
Hemoglobin: 9.5 g/dL — ABNORMAL LOW (ref 12.0–15.0)
Immature Granulocytes: 0 %
Lymphocytes Relative: 5 %
Lymphs Abs: 0.4 10*3/uL — ABNORMAL LOW (ref 0.7–4.0)
MCH: 33.6 pg (ref 26.0–34.0)
MCHC: 32.4 g/dL (ref 30.0–36.0)
MCV: 103.5 fL — ABNORMAL HIGH (ref 80.0–100.0)
Monocytes Absolute: 0.8 10*3/uL (ref 0.1–1.0)
Monocytes Relative: 9 %
Neutro Abs: 7.6 10*3/uL (ref 1.7–7.7)
Neutrophils Relative %: 86 %
Platelets: 274 10*3/uL (ref 150–400)
RBC: 2.83 MIL/uL — ABNORMAL LOW (ref 3.87–5.11)
RDW: 15.5 % (ref 11.5–15.5)
WBC: 8.9 10*3/uL (ref 4.0–10.5)
nRBC: 0 % (ref 0.0–0.2)

## 2022-04-25 LAB — URINALYSIS, ROUTINE W REFLEX MICROSCOPIC
Bilirubin Urine: NEGATIVE
Glucose, UA: NEGATIVE mg/dL
Hgb urine dipstick: NEGATIVE
Ketones, ur: 20 mg/dL — AB
Leukocytes,Ua: NEGATIVE
Nitrite: NEGATIVE
Protein, ur: 100 mg/dL — AB
Specific Gravity, Urine: 1.029 (ref 1.005–1.030)
pH: 5 (ref 5.0–8.0)

## 2022-04-25 LAB — PHENOBARBITAL LEVEL: Phenobarbital: 18.9 ug/mL (ref 15.0–40.0)

## 2022-04-25 LAB — MAGNESIUM: Magnesium: 1.9 mg/dL (ref 1.7–2.4)

## 2022-04-25 LAB — PHOSPHORUS: Phosphorus: 3.5 mg/dL (ref 2.5–4.6)

## 2022-04-25 LAB — PTH, INTACT AND CALCIUM
Calcium, Total (PTH): 8.1 mg/dL — ABNORMAL LOW (ref 8.7–10.3)
PTH: 32 pg/mL (ref 15–65)

## 2022-04-25 LAB — VITAMIN B12: Vitamin B-12: 663 pg/mL (ref 180–914)

## 2022-04-25 LAB — TSH: TSH: 4.325 u[IU]/mL (ref 0.350–4.500)

## 2022-04-25 LAB — SURGICAL PATHOLOGY

## 2022-04-25 LAB — PHENYTOIN LEVEL, TOTAL: Phenytoin Lvl: 2.6 ug/mL — ABNORMAL LOW (ref 10.0–20.0)

## 2022-04-25 MED ORDER — PHENOBARBITAL SODIUM 65 MG/ML IJ SOLN
65.0000 mg | Freq: Once | INTRAMUSCULAR | Status: AC
  Administered 2022-04-25: 65 mg via INTRAVENOUS

## 2022-04-25 MED ORDER — SODIUM CHLORIDE 0.9 % IV SOLN
200.0000 mg | Freq: Once | INTRAVENOUS | Status: AC
  Administered 2022-04-25: 200 mg via INTRAVENOUS
  Filled 2022-04-25: qty 4

## 2022-04-25 MED ORDER — HYDRALAZINE HCL 20 MG/ML IJ SOLN: 10.0000 mg | INTRAMUSCULAR | Status: DC | PRN

## 2022-04-25 MED ORDER — METRONIDAZOLE 500 MG/100ML IV SOLN
500.0000 mg | Freq: Two times a day (BID) | INTRAVENOUS | Status: DC
  Administered 2022-04-26: 500 mg via INTRAVENOUS
  Filled 2022-04-25: qty 100

## 2022-04-25 MED ORDER — LACTATED RINGERS IV SOLN: INTRAVENOUS | Status: AC

## 2022-04-25 MED ORDER — ACETAMINOPHEN 160 MG/5ML PO SOLN
1000.0000 mg | Freq: Four times a day (QID) | ORAL | Status: DC
  Administered 2022-04-25: 1000 mg via ORAL
  Filled 2022-04-25 (×2): qty 40.6

## 2022-04-25 NOTE — Progress Notes (Signed)
PROGRESS NOTE    [name]  [id number]:[id number] DOB: May 20, 1936 DOA: 04/22/2022 PCP: Virgie Dad, MD    Brief Narrative:  86 year old from assisted living facility with history of permanent A-fib status post pacemaker, hypertension, complex partial epilepsy since childhood presented with 1 days of right upper quadrant abdominal pain and she was found to have calculus cholecystitis.  She was admitted to the hospital with IV fluids, antibiotics.  Underwent laparoscopic cholecystectomy with grossly infected gallbladder.  Good clinical recovery.  11/15.  After appropriate recovery from surgery, she started developing word finding difficulties and confusion.  Symptoms significantly worse overnight.  No focal deficits.   Assessment & Plan:   Acute metabolic encephalopathy, confusion and word finding difficulty not present on admission. Probably multifactorial.  Started after surgical procedure.  Nonfocal exam except word finding difficulties. Not a tPA candidate, stroke alert was not activated.  Probably generalized cause. Electrolytes, sodium, potassium, magnesium and phosphorus within normal limits. B12 is adequate. TSH pending. Urinalysis was normal. Stat CT head and subsequent MRI to rule out TIA.  Patient does have history of A-fib but unable to anticoagulate.  TIA or stroke is in differential diagnosis. Reevaluation by speech PT OT. We will discontinue cefepime as it can cause or worsen encephalopathy. Supportive treatment, delirium precautions.  Avoid narcotics and benzos.  Cholecystitis with cholelithiasis: Lap chole day 2 postop.  Eating diet. Surgically stable as per surgery.    Hypomagnesemia: Severe and persistent.  Presentation magnesium 1.1.  Aggressively replaced.  She does have history of hypomagnesemia, she will need to stay on supplementation and that was prescribed.  History of seizure disorder: Since childhood.  She continues on phenobarbital and phenytoin.    Chronic medical issues including essential hypertension, hyperlipidemia.  Unchanged.  No changes in medication done.   DVT prophylaxis: enoxaparin (LOVENOX) injection 40 mg Start: 04/24/22 1000   Code Status: DNR Family Communication: Daughter at the bedside Disposition Plan: Status is: Inpatient Remains inpatient appropriate because: Immediate postop, altered mental status.     Consultants:  General surgery  Procedures:  Lap chole  Antimicrobials:  Cefepime and Flagyl 11/12---11/15   Subjective:  Patient seen and examined.  Overnight confused, just answering yes or no.  Word finding difficulty.  Unable to get any history.  Objective: Vitals:   04/24/22 1345 04/24/22 1510 04/24/22 1944 04/25/22 0657  BP: (!) 156/49 (!) 163/41 (!) 173/42 (!) 166/50  Pulse:  60 (!) 59 (!) 59  Resp:  '18 18 20  '$ Temp:  99.2 F (37.3 C) 98.6 F (37 C) 98.1 F (36.7 C)  TempSrc:  Oral Oral Oral  SpO2: (!) 87% 98% 98% 99%  Weight:      Height:        Intake/Output Summary (Last 24 hours) at 04/25/2022 1011 Last data filed at 04/25/2022 0900 Gross per 24 hour  Intake 120 ml  Output 600 ml  Net -480 ml   Filed Weights   04/22/22 0406  Weight: 62.1 kg    Examination:  General exam: Appears calm and comfortable , she looks confused. Cranial nerve exam is normal.  Motor and sensory exam with both extremities is normal.  Inconsistently following commands. Speech is fluent, however very limited. Respiratory system: Clear to auscultation. Respiratory effort normal. Cardiovascular system: S1 & S2 heard, RRR.  Pacemaker in place.   Gastrointestinal system: Soft.  Nontender.  Port site clean and dry. Extremities: Symmetric 5 x 5 power.  Moves all extremities. Skin: No rashes, lesions or  ulcers Psychiatry: Judgement and insight appear normal. Mood & affect anxious.    Data Reviewed: I have personally reviewed following labs and imaging studies  CBC: Recent Labs  Lab  04/22/22 0434 04/23/22 0811 04/24/22 0504 04/25/22 0735  WBC 13.3* 15.4* 11.3* 8.9  NEUTROABS 11.9*  --  10.0* 7.6  HGB 10.2* 9.6* 9.1* 9.5*  HCT 31.7* 29.3* 28.1* 29.3*  MCV 104.3* 103.2* 104.5* 103.5*  PLT 288 249 237 329   Basic Metabolic Panel: Recent Labs  Lab 04/22/22 0434 04/22/22 1826 04/23/22 0811 04/24/22 0504 04/25/22 0735  NA 141  --  136 136 135  K 3.7  --  4.2 3.7 3.7  CL 105  --  101 103 103  CO2 27  --  25 22 19*  GLUCOSE 140*  --  116* 91 89  BUN 25*  --  25* 29* 29*  CREATININE 1.05*  --  1.25* 1.14* 1.32*  CALCIUM 8.2* 8.1* 8.0* 8.1* 8.4*  MG  --  1.1*  --  1.8 1.9  PHOS  --   --   --  4.4 3.5   GFR: Estimated Creatinine Clearance: 29.7 mL/min (A) (by C-G formula based on SCr of 1.32 mg/dL (H)). Liver Function Tests: Recent Labs  Lab 04/22/22 0434 04/23/22 0811 04/24/22 0504 04/25/22 0735  AST '29 25 24 21  '$ ALT '18 16 16 15  '$ ALKPHOS 68 58 52 53  BILITOT 0.6 0.9 0.7 1.1  PROT 7.0 6.3* 5.8* 5.9*  ALBUMIN 3.7 3.2* 2.8* 2.8*   Recent Labs  Lab 04/22/22 0434  LIPASE 42   No results for input(s): "AMMONIA" in the last 168 hours. Coagulation Profile: No results for input(s): "INR", "PROTIME" in the last 168 hours. Cardiac Enzymes: No results for input(s): "CKTOTAL", "CKMB", "CKMBINDEX", "TROPONINI" in the last 168 hours. BNP (last 3 results) No results for input(s): "PROBNP" in the last 8760 hours. HbA1C: Recent Labs    04/22/22 1826  HGBA1C 4.9   CBG: Recent Labs  Lab 04/23/22 2037  GLUCAP 122*   Lipid Profile: No results for input(s): "CHOL", "HDL", "LDLCALC", "TRIG", "CHOLHDL", "LDLDIRECT" in the last 72 hours. Thyroid Function Tests: Recent Labs    04/25/22 0735  TSH 4.325   Anemia Panel: Recent Labs    04/22/22 1826 04/25/22 0735  VITAMINB12 443 663  FOLATE 38.0  --    Sepsis Labs: No results for input(s): "PROCALCITON", "LATICACIDVEN" in the last 168 hours.  No results found for this or any previous visit (from  the past 240 hour(s)).       Radiology Studies: No results found.      Scheduled Meds:  acetaminophen  650 mg Oral Q6H   enoxaparin (LOVENOX) injection  40 mg Subcutaneous Q24H   hydrALAZINE  25 mg Oral BID   irbesartan  150 mg Oral Daily   magnesium oxide  400 mg Oral BID   PHENobarbital  64.8 mg Oral q AM   phenytoin  200 mg Oral Q breakfast   Continuous Infusions:  lactated ringers 75 mL/hr at 04/23/22 1245   metronidazole 500 mg (04/24/22 2143)     LOS: 1 day    Time spent: 35 minutes    Barb Merino, MD [facility] Pager [phone]

## 2022-04-25 NOTE — Progress Notes (Signed)
2 Days Post-Op   Subjective/Chief Complaint: Comfortable this morning, a little baseline confusion   Objective: Vital signs in last 24 hours: Temp:  [98.1 F (36.7 C)-99.2 F (37.3 C)] 98.1 F (36.7 C) (11/15 0657) Pulse Rate:  [59-60] 59 (11/15 0657) Resp:  [18-20] 20 (11/15 0657) BP: (156-173)/(41-50) 166/50 (11/15 0657) SpO2:  [87 %-99 %] 99 % (11/15 0657) Last BM Date : 04/22/22  Intake/Output from previous day: 11/14 0701 - 11/15 0700 In: 120 [P.O.:120] Out: -  Intake/Output this shift: No intake/output data recorded.  Exam: Awake, follows commands Looks comfortable Abdomen soft, incisions clean, appropriately tender  Lab Results:  Recent Labs    04/23/22 0811 04/24/22 0504  WBC 15.4* 11.3*  HGB 9.6* 9.1*  HCT 29.3* 28.1*  PLT 249 237   BMET Recent Labs    04/23/22 0811 04/24/22 0504  NA 136 136  K 4.2 3.7  CL 101 103  CO2 25 22  GLUCOSE 116* 91  BUN 25* 29*  CREATININE 1.25* 1.14*  CALCIUM 8.0* 8.1*   PT/INR No results for input(s): "LABPROT", "INR" in the last 72 hours. ABG No results for input(s): "PHART", "HCO3" in the last 72 hours.  Invalid input(s): "PCO2", "PO2"  Studies/Results: No results found.  Anti-infectives: Anti-infectives (From admission, onward)    Start     Dose/Rate Route Frequency Ordered Stop   04/24/22 0000  cefdinir (OMNICEF) 300 MG capsule        300 mg Oral 2 times daily 04/24/22 1031 04/29/22 2359   04/24/22 0000  metroNIDAZOLE (FLAGYL) 500 MG tablet        500 mg Oral 2 times daily 04/24/22 1031 04/29/22 2359   04/22/22 2200  metroNIDAZOLE (FLAGYL) IVPB 500 mg        500 mg 100 mL/hr over 60 Minutes Intravenous Every 12 hours 04/22/22 1732     04/22/22 2200  ceFEPIme (MAXIPIME) 2 g in sodium chloride 0.9 % 100 mL IVPB  Status:  Discontinued        2 g 200 mL/hr over 30 Minutes Intravenous Every 12 hours 04/22/22 0946 04/25/22 0728   04/22/22 0830  ceFEPIme (MAXIPIME) 2 g in sodium chloride 0.9 % 100 mL IVPB        See Hyperspace for full Linked Orders Report.   2 g 200 mL/hr over 30 Minutes Intravenous  Once 04/22/22 0825 04/22/22 0917   04/22/22 0830  metroNIDAZOLE (FLAGYL) IVPB 500 mg       See Hyperspace for full Linked Orders Report.   500 mg 100 mL/hr over 60 Minutes Intravenous  Once 04/22/22 0825 04/22/22 1025       Assessment/Plan: s/p Procedure(s): LAPAROSCOPIC CHOLECYSTECTOMY (N/A)  POD#2  Labs pending.   Stable from a surgical standpoint Back to her facility when ok with the Hospitalists    LOS: 1 day    [name] 04/25/2022

## 2022-04-25 NOTE — Progress Notes (Incomplete)
04/25/22 1130  PT Visit Information  Last PT Received On 04/25/22  Assistance Needed +1  History of Present Illness Pt is an 86yo female presenting s/p laparoscopic cholecystectomy on 04/23/22. Pt resides at [facility]. Presenting with post-anesthesia change in cognitive status. PMH: afib with pacemaker, HTN, epilepsy, osteopenia.  Precautions  Precautions Fall  Home Living  Family/patient expects to be discharged to: Assisted living  Index (4 wheels)  Additional Comments Pt lives at [facility] garden apartment. Per daughter, pt is largely modified independent.  Prior Function  Prior Level of Function  Independent/Modified Independent  Mobility Comments Pt's daughter reports ind with rollator walker. No falls in last 6 months.  ADLs Comments Pt's daughter reports facility cleans apartment 1x/week, provides meals and medications, requires assist with bathing.  Communication  Communication Receptive difficulties;Expressive difficulties (Pt appears to be struggling with the expressive side of communication. when asked, "Do you have something to say but can't figure out how to say it?" pt responds with yes. She also says "Yes" and "okay;')  Pain Assessment  Pain Assessment No/denies pain  Cognition  Arousal/Alertness Awake/alert  Behavior During Therapy WFL for tasks assessed/performed  Overall Cognitive Status Impaired/Different from baseline  Area of Impairment Orientation;Attention;Memory;Following commands;Safety/judgement;Awareness  Orientation Level Disoriented to;Place;Time;Situation  Current Attention Level Focused  Following Commands Follows one step commands inconsistently  Safety/Judgement Decreased awareness of deficits;Decreased awareness of safety  Awareness Intellectual  General Comments Per pt's daughter and mobility technician, pt conversant and pleasant two days before. At time of evaluation, pt only able to nod her head and say "okay" or  "yes." Pt tearful at times.  Upper Extremity Assessment  Upper Extremity Assessment Defer to OT evaluation;Generalized weakness;Difficult to assess due to impaired cognition (Pt allows PT to take both UEs through PROM, but otherwise unable to follow commands for MMT or functional testing.)  Lower Extremity Assessment  Lower Extremity Assessment Generalized weakness;Difficult to assess due to impaired cognition;RLE deficits/detail;LLE deficits/detail  RLE Deficits / Details Pt allows PT to take both LEs through PROM with no evidence of contractures but otherwise unable to follow commands for MMT or functional testing.  RLE Sensation WNL  LLE Deficits / Details Pt allows PT to take both LEs through PROM with no evidence of contractures but otherwise unable to follow commands for MMT or functional testing.  LLE Sensation WNL  Cervical / Trunk Assessment  Cervical / Trunk Assessment Kyphotic  Bed Mobility  Overal bed mobility  (Pt unable to follow commands)  General bed mobility comments Attempted but pt unable to follow commands, at this time if needed to mobilize would be total assist using lift equipment.  General Comments  General comments (skin integrity, edema, etc.) Pt's daughter [name] providing all PLOF and home environment information.  PT - End of Session  Equipment Utilized During Treatment Oxygen (2L via [place])  Activity Tolerance Other (comment) (Limited by cognition, lack of command following)  Patient left in bed;with call bell/phone within reach;with bed alarm set;with family/visitor present  Nurse Communication Mobility status  PT Assessment  PT Recommendation/Assessment Patient needs continued PT services  PT Visit Diagnosis Other symptoms and signs involving the nervous system (R29.898);Muscle weakness (generalized) (M62.81);Other abnormalities of gait and mobility (R26.89)  PT Problem List Decreased strength;Decreased range of motion;Decreased activity tolerance;Decreased  balance;Decreased mobility;Decreased coordination;Decreased cognition;Decreased knowledge of use of DME;Decreased safety awareness;Pain  PT Plan  PT Frequency (ACUTE ONLY) Min 2X/week  PT Treatment/Interventions (ACUTE ONLY) DME instruction;Gait training;Stair training;Functional mobility  training;Therapeutic activities;Therapeutic exercise;Balance training;Neuromuscular re-education;Cognitive remediation;Patient/family education;Wheelchair mobility training  AM-PAC PT "6 Clicks" Mobility Outcome Measure (Version 2)  Help needed turning from your back to your side while in a flat bed without using bedrails? 1  Help needed moving from lying on your back to sitting on the side of a flat bed without using bedrails? 1  Help needed moving to and from a bed to a chair (including a wheelchair)? 1  Help needed standing up from a chair using your arms (e.g., wheelchair or bedside chair)? 1  Help needed to walk in hospital room? 1  Help needed climbing 3-5 steps with a railing?  1  6 Click Score 6  Consider Recommendation of Discharge To: CIR/SNF/LTACH  Progressive Mobility  What is the highest level of mobility based on the progressive mobility assessment? Level 1 (Bedfast) - Unable to balance while sitting on edge of bed  Activity Refused mobility  PT Recommendation  Follow Up Recommendations Skilled nursing-short term rehab (<3 hours/day)  Can patient physically be transported by private vehicle No  Assistance recommended at discharge Frequent or constant Supervision/Assistance  Patient can return home with the following Two people to help with walking and/or transfers;Two people to help with bathing/dressing/bathroom;Assistance with cooking/housework;Assistance with feeding;Direct supervision/assist for medications management;Direct supervision/assist for financial management;Assist for transportation;Help with stairs or ramp for entrance  Functional Status Assessment Patient has had a recent decline  in their functional status and demonstrates the ability to make significant improvements in function in a reasonable and predictable amount of time.  PT equipment None recommended by PT (TBD)  Individuals Consulted  Consulted and Agree with Results and Recommendations Family member/caregiver  Family Member Consulted Daughter [name]  Acute Rehab PT Goals  Patient Stated Goal none stated  PT Goal Formulation With family  Time For Goal Achievement 05/09/22  Potential to Achieve Goals Fair  PT Time Calculation  PT Start Time (ACUTE ONLY) 1130  PT Stop Time (ACUTE ONLY) 1155  PT Time Calculation (min) (ACUTE ONLY) 25 min  PT General Charges  $$ ACUTE PT VISIT 1 Visit  PT Evaluation  $PT Eval Moderate Complexity 1 Mod  Written Expression  Dominant Hand Right

## 2022-04-25 NOTE — Evaluation (Signed)
Clinical/Bedside Swallow Evaluation Patient Details  Name: [name] MRN: [id number] Date of Birth: 1936/01/05  Today's Date: 04/25/2022 Time: SLP Start Time (ACUTE ONLY): 1525 SLP Stop Time (ACUTE ONLY): 1555 SLP Time Calculation (min) (ACUTE ONLY): 30 min  Past Medical History:  Past Medical History:  Diagnosis Date   Abnormal cardiovascular stress test    Carotid arterial disease ([place])    MODERATE RIGHT   Cerebral AV malformation    DDD (degenerative disc disease)    SEVERE WITH SPINAL STENOSIS   Hypercholesterolemia    Hypertension    MVP (mitral valve prolapse)    MILD   Numbness    LEFT FOOT AND ANKLE   Osteopenia    Presence of permanent cardiac pacemaker    Pseudo-gout    Past Surgical History:  Past Surgical History:  Procedure Laterality Date   APPENDECTOMY     BREAST MASS EXCISION     CARPAL TUNNEL RELEASE     CHOLECYSTECTOMY N/A 04/23/2022   Procedure: LAPAROSCOPIC CHOLECYSTECTOMY;  Surgeon: Coralie Keens, MD;  Location: [facility];  Service: General;  Laterality: N/A;   PACEMAKER IMPLANT N/A 11/07/2021   Procedure: PACEMAKER IMPLANT;  Surgeon: Evans Lance, MD;  Location: [facility];  Service: Cardiovascular;  Laterality: N/A;   REMOVAL OF PARATHYROID GLAND     STRESS NUCLEAR STUDY     ABNORMAL   TONSILLECTOMY     HPI:  Per PT note "86yo female presenting s/p laparoscopic cholecystectomy on 04/23/22. Pt resides at [facility]. Presenting with post-anesthesia change in cognitive status. PMH: afib with pacemaker, HTN, epilepsy, osteopenia. Brain MRI showed "AVM left frontal lobe measuring 4.7 x 5.1 cm. There is a  large draining superficial cortical vein. The left anterior cerebral  artery is enlarged and appears to be the primary supply to the AVM.  No evidence of prior hemorrhage.  2. 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  Daughter,  [name], present and reports pt had no premorbid difficulties with memory, language or swallowing.  Pt was not following directions with PT today.    Assessment / Plan / Recommendation  Clinical Impression  Challenging exam given pt's decreased ability to follow directions.  She is noted to have right facial asymmetry concerning for facial nerve deficit.  PO trials included thin water and thin tea via straw, cookie bolus and peaches. Hand over hand assist for improved neural input used with liquids.  Suspect pt with oral dysphagia c/b holding across all boluses.   Prolonged mastication and transiting of solids observed with pt holding bolus orally during several respiratory cycles.   Eructation noted after thin liquids consumption which daughter states is normal.    Pt has stereotypical verbalization "OK" during session but does not answer questions yes/no nor follow one step directions consistently.  She appears with potential motor planning issues along with cognitive linguistic deficits. During session, pt variably appeared to have pain - wincing and not desiring food/liquid intake and/or frustration - by sighing and closing her eyes.   Given uncertainty of potential discomfort and recent gallbladder procedure, recommend clear liquid diet and consider chanigng medications to IV that can be changed.  She would benefit from SLP speech evaluation to help maximize communication, decrease frustration and caregiver burden with current medical status.  Using teach back, informed daughter, [name], pt and RN to recommendations.  Will follow up next date. SLP Visit Diagnosis: Dysphagia,  oral phase (R13.11)    Aspiration Risk  Moderate aspiration risk    Diet Recommendation Thin liquid (clear liquids)   Medication Administration: Whole meds with puree (prefer via IV if pt can tolerate) Supervision: Staff to assist with self feeding (hand over hand assist) Compensations: Small sips/bites;Slow rate Postural Changes:  Seated upright at 90 degrees    Other  Recommendations Oral Care Recommendations: Oral care BID    Recommendations for follow up therapy are one component of a multi-disciplinary discharge planning process, led by the attending physician.  Recommendations may be updated based on patient status, additional functional criteria and insurance authorization.  Follow up Recommendations Skilled nursing-short term rehab (<3 hours/day)      Assistance Recommended at Discharge    Functional Status Assessment Patient has had a recent decline in their functional status and demonstrates the ability to make significant improvements in function in a reasonable and predictable amount of time.  Frequency and Duration min 1 x/week  1 week       Prognosis Prognosis for Safe Diet Advancement: Fair Barriers to Reach Goals: Cognitive deficits;Language deficits;Motivation      Swallow Study   General Date of Onset: 04/25/22 HPI: Per PT note "86yo female presenting s/p laparoscopic cholecystectomy on 04/23/22. Pt resides at [facility]. Presenting with post-anesthesia change in cognitive status. PMH: afib with pacemaker, HTN, epilepsy, osteopenia. Brain MRI showed "AVM left frontal lobe measuring 4.7 x 5.1 cm. There is a  large draining superficial cortical vein. The left anterior cerebral  artery is enlarged and appears to be the primary supply to the AVM.  No evidence of prior hemorrhage.  2. 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  Daughter, [name], present and reports pt had no premorbid difficulties with memory, language or swallowing.  Pt was not following directions with PT today. Type of Study: Bedside Swallow Evaluation Diet Prior to this Study: Dysphagia 3 (soft);Thin liquids Temperature Spikes Noted: No Respiratory Status: Room air History of Recent Intubation: No Behavior/Cognition:  Alert;Doesn't follow directions;Other (Comment) (hand over hand assist provided to improve participation) Oral Cavity Assessment: Other (comment) (pt did not open mouth adequately to fully view) Oral Care Completed by SLP: No (pt with dentures in place) Vision:  (has reading glasses) Self-Feeding Abilities: Needs assist Patient Positioning: Upright in bed Baseline Vocal Quality: Normal Volitional Cough: Cognitively unable to elicit Volitional Swallow: Unable to elicit    Oral/Motor/Sensory Function Overall Oral Motor/Sensory Function: Other (comment) (pt appears with right facial asymmetry - suspect facial nerve deficit - as daughter reports this is not pt's baseline, pt able to seal lips on a straw)   Ice Chips Ice chips: Not tested   Thin Liquid Thin Liquid: Impaired Presentation: Self Fed;Straw Oral Phase Functional Implications: Oral holding Pharyngeal  Phase Impairments: Suspected delayed Swallow    Nectar Thick Nectar Thick Liquid: Not tested   Honey Thick Honey Thick Liquid: Not tested   Puree Puree: Not tested   Solid     Solid: Impaired Presentation: Spoon Oral Phase Impairments: Impaired mastication;Reduced lingual movement/coordination Oral Phase Functional Implications: Impaired mastication;Prolonged oral transit Pharyngeal Phase Impairments: Suspected delayed Hampton Abbot 04/25/2022,4:13 PM  Kathleen Lime, MS [facility] SLP Acute Rehab Services Office [phone] Pager [phone]

## 2022-04-25 NOTE — Evaluation (Signed)
Physical Therapy Evaluation Patient Details Name: [name] MRN: [id number] DOB: Dec 09, 1935 Today's Date: 04/25/2022  Clinical Impression Pt received supine in bed with problems and functional impairments listed below. Daughter [name] and mobility tech provided information on pt's PLOF and home environment as pt unable to vocalize more than "yes" or "okay." Pt struggling with expressive difficulties and tearful at times but generally pleasant and allows PT to interact with her. Attempted bed mobility but deemed unsafe as pt unable to follow even single step commands. Pt did allow PT to take all four extremities through passive range of motion, with no evidence of contractures. At this time pt requires total assist secondary to cognitive status. Discussed possibility of SNF-level therapies upon discharge and daughter is agreeable. We will continue to follow acutely.    04/25/22 1130  PT Visit Information  Last PT Received On 04/25/22  Assistance Needed +1  History of Present Illness Pt is an 86yo female presenting s/p laparoscopic cholecystectomy on 04/23/22. Pt resides at [facility]. Presenting with post-anesthesia change in cognitive status. PMH: afib with pacemaker, HTN, epilepsy, osteopenia.  Precautions  Precautions Fall  Home Living  Family/patient expects to be discharged to: Assisted living  [place] (4 wheels)  Additional Comments Pt lives at [facility] garden apartment. Per daughter, pt is largely modified independent.  Prior Function  Prior Level of Function  Independent/Modified Independent  Mobility Comments Pt's daughter reports ind with rollator walker. No falls in last 6 months.  ADLs Comments Pt's daughter reports facility cleans apartment 1x/week, provides meals and medications, requires assist with bathing.  Communication  Communication Receptive difficulties;Expressive difficulties (Pt appears to be struggling with the expressive side of  communication. when asked, "Do you have something to say but can't figure out how to say it?" pt responds with yes. She also says "Yes" and "okay;')  Pain Assessment  Pain Assessment No/denies pain  Cognition  Arousal/Alertness Awake/alert  Behavior During Therapy WFL for tasks assessed/performed  Overall Cognitive Status Impaired/Different from baseline  Area of Impairment Orientation;Attention;Memory;Following commands;Safety/judgement;Awareness  Orientation Level Disoriented to;Place;Time;Situation  Current Attention Level Focused  Following Commands Follows one step commands inconsistently  Safety/Judgement Decreased awareness of deficits;Decreased awareness of safety  Awareness Intellectual  General Comments Per pt's daughter and mobility technician, pt conversant and pleasant two days before. At time of evaluation, pt only able to nod her head and say "okay" or "yes." Pt tearful at times.  Upper Extremity Assessment  Upper Extremity Assessment Defer to OT evaluation;Generalized weakness;Difficult to assess due to impaired cognition (Pt allows PT to take both UEs through PROM, but otherwise unable to follow commands for MMT or functional testing.)  Lower Extremity Assessment  Lower Extremity Assessment Generalized weakness;Difficult to assess due to impaired cognition;RLE deficits/detail;LLE deficits/detail  RLE Deficits / Details Pt allows PT to take both LEs through PROM with no evidence of contractures but otherwise unable to follow commands for MMT or functional testing.  RLE Sensation WNL  LLE Deficits / Details Pt allows PT to take both LEs through PROM with no evidence of contractures but otherwise unable to follow commands for MMT or functional testing.  LLE Sensation WNL  Cervical / Trunk Assessment  Cervical / Trunk Assessment Kyphotic  Bed Mobility  Overal bed mobility  (Pt unable to follow commands)  General bed mobility comments Attempted but pt unable to follow  commands, at this time if needed to mobilize would be total assist using lift equipment.  General Comments  General comments (skin integrity, edema, etc.) Pt's daughter [name] providing all PLOF and home environment information.  PT - End of Session  Equipment Utilized During Treatment Oxygen (2L via [place])  Activity Tolerance Other (comment) (Limited by cognition, lack of command following)  Patient left in bed;with call bell/phone within reach;with bed alarm set;with family/visitor present  Nurse Communication Mobility status  PT Assessment  PT Recommendation/Assessment Patient needs continued PT services  PT Visit Diagnosis Other symptoms and signs involving the nervous system (R29.898);Muscle weakness (generalized) (M62.81);Other abnormalities of gait and mobility (R26.89)  PT Problem List Decreased strength;Decreased range of motion;Decreased activity tolerance;Decreased balance;Decreased mobility;Decreased coordination;Decreased cognition;Decreased knowledge of use of DME;Decreased safety awareness;Pain  PT Plan  PT Frequency (ACUTE ONLY) Min 2X/week  PT Treatment/Interventions (ACUTE ONLY) DME instruction;Gait training;Stair training;Functional mobility training;Therapeutic activities;Therapeutic exercise;Balance training;Neuromuscular re-education;Cognitive remediation;Patient/family education;Wheelchair mobility training  AM-PAC PT "6 Clicks" Mobility Outcome Measure (Version 2)  Help needed turning from your back to your side while in a flat bed without using bedrails? 1  Help needed moving from lying on your back to sitting on the side of a flat bed without using bedrails? 1  Help needed moving to and from a bed to a chair (including a wheelchair)? 1  Help needed standing up from a chair using your arms (e.g., wheelchair or bedside chair)? 1  Help needed to walk in hospital room? 1  Help needed climbing 3-5 steps with a railing?  1  6 Click Score 6  Consider Recommendation of Discharge  To: CIR/SNF/LTACH  Progressive Mobility  What is the highest level of mobility based on the progressive mobility assessment? Level 1 (Bedfast) - Unable to balance while sitting on edge of bed  Activity Refused mobility  PT Recommendation  Follow Up Recommendations Skilled nursing-short term rehab (<3 hours/day)  Can patient physically be transported by private vehicle No  Assistance recommended at discharge Frequent or constant Supervision/Assistance  Patient can return home with the following Two people to help with walking and/or transfers;Two people to help with bathing/dressing/bathroom;Assistance with cooking/housework;Assistance with feeding;Direct supervision/assist for medications management;Direct supervision/assist for financial management;Assist for transportation;Help with stairs or ramp for entrance  Functional Status Assessment Patient has had a recent decline in their functional status and demonstrates the ability to make significant improvements in function in a reasonable and predictable amount of time.  PT equipment None recommended by PT (TBD)  Individuals Consulted  Consulted and Agree with Results and Recommendations Family member/caregiver  Family Member Consulted Daughter [name]  Acute Rehab PT Goals  Patient Stated Goal none stated  PT Goal Formulation With family  Time For Goal Achievement 05/09/22  Potential to Achieve Goals Fair  PT Time Calculation  PT Start Time (ACUTE ONLY) 1130  PT Stop Time (ACUTE ONLY) 1155  PT Time Calculation (min) (ACUTE ONLY) 25 min  PT General Charges  $$ ACUTE PT VISIT 1 Visit  PT Evaluation  $PT Eval Moderate Complexity 1 Mod  Written Expression  Dominant Hand Right    Coolidge Breeze, PT, DPT [facility] Rehabilitation Department Office: [phone] Weekend pager: [phone]  Coolidge Breeze 04/25/2022, 3:48 PM

## 2022-04-26 ENCOUNTER — Inpatient Hospital Stay (HOSPITAL_COMMUNITY)
Admit: 2022-04-26 | Discharge: 2022-04-26 | Disposition: A | Discharge status: Home / Self Care | Payer: Medicare Other | Attending: Internal Medicine | Admitting: Internal Medicine

## 2022-04-26 ENCOUNTER — Inpatient Hospital Stay (HOSPITAL_COMMUNITY): Payer: Medicare Other

## 2022-04-26 ENCOUNTER — Ambulatory Visit: Payer: Medicare Other | Admitting: Cardiology

## 2022-04-26 DIAGNOSIS — R4182 Altered mental status, unspecified: Secondary | ICD-10-CM

## 2022-04-26 DIAGNOSIS — R569 Unspecified convulsions: Secondary | ICD-10-CM

## 2022-04-26 LAB — COMPREHENSIVE METABOLIC PANEL
ALT: 12 U/L (ref 0–44)
AST: 18 U/L (ref 15–41)
Albumin: 2.6 g/dL — ABNORMAL LOW (ref 3.5–5.0)
Alkaline Phosphatase: 54 U/L (ref 38–126)
Anion gap: 10 (ref 5–15)
BUN: 27 mg/dL — ABNORMAL HIGH (ref 8–23)
CO2: 21 mmol/L — ABNORMAL LOW (ref 22–32)
Calcium: 8.6 mg/dL — ABNORMAL LOW (ref 8.9–10.3)
Chloride: 105 mmol/L (ref 98–111)
Creatinine, Ser: 1.24 mg/dL — ABNORMAL HIGH (ref 0.44–1.00)
GFR, Estimated: 43 mL/min — ABNORMAL LOW (ref >60)
Glucose, Bld: 98 mg/dL (ref 70–99)
Potassium: 3.8 mmol/L (ref 3.5–5.1)
Sodium: 136 mmol/L (ref 135–145)
Total Bilirubin: 0.9 mg/dL (ref 0.3–1.2)
Total Protein: 5.9 g/dL — ABNORMAL LOW (ref 6.5–8.1)

## 2022-04-26 LAB — PHENOBARBITAL LEVEL: Phenobarbital: 19.7 ug/mL (ref 15.0–40.0)

## 2022-04-26 LAB — CBC
HCT: 32.4 % — ABNORMAL LOW (ref 36.0–46.0)
Hemoglobin: 10.2 g/dL — ABNORMAL LOW (ref 12.0–15.0)
MCH: 33.4 pg (ref 26.0–34.0)
MCHC: 31.5 g/dL (ref 30.0–36.0)
MCV: 106.2 fL — ABNORMAL HIGH (ref 80.0–100.0)
Platelets: 283 10*3/uL (ref 150–400)
RBC: 3.05 MIL/uL — ABNORMAL LOW (ref 3.87–5.11)
RDW: 15.2 % (ref 11.5–15.5)
WBC: 5.7 10*3/uL (ref 4.0–10.5)
nRBC: 0 % (ref 0.0–0.2)

## 2022-04-26 LAB — AMMONIA: Ammonia: 18 umol/L (ref 9–35)

## 2022-04-26 LAB — PHENYTOIN LEVEL, TOTAL: Phenytoin Lvl: 4 ug/mL — ABNORMAL LOW (ref 10.0–20.0)

## 2022-04-26 MED ORDER — LEVETIRACETAM IN NACL 1500 MG/100ML IV SOLN
1500.0000 mg | Freq: Once | INTRAVENOUS | Status: AC
  Administered 2022-04-26: 1500 mg via INTRAVENOUS
  Filled 2022-04-26: qty 100

## 2022-04-26 MED ORDER — SODIUM CHLORIDE 0.9 % IV SOLN
300.0000 mg | Freq: Once | INTRAVENOUS | Status: AC
  Administered 2022-04-26: 300 mg via INTRAVENOUS
  Filled 2022-04-26: qty 6

## 2022-04-26 MED ORDER — ACETAMINOPHEN 160 MG/5ML PO SOLN: 1000.0000 mg | Freq: Four times a day (QID) | ORAL | Status: DC | PRN

## 2022-04-26 MED ORDER — PHENOBARBITAL SODIUM 65 MG/ML IJ SOLN
65.0000 mg | Freq: Once | INTRAMUSCULAR | Status: AC
  Administered 2022-04-26: 65 mg via INTRAVENOUS
  Filled 2022-04-26: qty 1

## 2022-04-26 MED ORDER — PHENYTOIN SODIUM 50 MG/ML IJ SOLN
100.0000 mg | Freq: Two times a day (BID) | INTRAMUSCULAR | Status: DC
  Administered 2022-04-26 – 2022-04-30 (×9): 100 mg via INTRAVENOUS
  Filled 2022-04-26 (×11): qty 2

## 2022-04-26 MED ORDER — CHLORHEXIDINE GLUCONATE CLOTH 2 % EX PADS
6.0000 | MEDICATED_PAD | Freq: Every day | CUTANEOUS | Status: DC
  Administered 2022-04-26 – 2022-04-30 (×5): 6 via TOPICAL

## 2022-04-26 MED ORDER — SODIUM CHLORIDE 0.9 % IV SOLN
200.0000 mg | Freq: Once | INTRAVENOUS | Status: DC
  Filled 2022-04-26: qty 4

## 2022-04-26 MED ORDER — LEVETIRACETAM IN NACL 500 MG/100ML IV SOLN
500.0000 mg | Freq: Two times a day (BID) | INTRAVENOUS | Status: DC
  Administered 2022-04-26 – 2022-04-28 (×4): 500 mg via INTRAVENOUS
  Filled 2022-04-26 (×5): qty 100

## 2022-04-26 MED ORDER — ACETAMINOPHEN 10 MG/ML IV SOLN
1000.0000 mg | Freq: Four times a day (QID) | INTRAVENOUS | Status: AC
  Administered 2022-04-26 – 2022-04-27 (×2): 1000 mg via INTRAVENOUS
  Filled 2022-04-26 (×6): qty 100

## 2022-04-26 MED ORDER — SODIUM CHLORIDE 0.9 % IV SOLN
400.0000 mg | Freq: Once | INTRAVENOUS | Status: DC
  Filled 2022-04-26: qty 8

## 2022-04-26 MED ORDER — PHENOBARBITAL SODIUM 65 MG/ML IJ SOLN
65.0000 mg | Freq: Every day | INTRAMUSCULAR | Status: DC
  Administered 2022-04-27 – 2022-04-30 (×4): 65 mg via INTRAVENOUS
  Filled 2022-04-26 (×4): qty 1

## 2022-04-26 NOTE — Care Management (Signed)
Patient reevaluated at the bedside with neurology at the bedside.  EEG was without any evidence of seizure, however her symptomatology and left frontal lobe lesion likely causing subclinical seizures and postictal state.  Patient in need for long-term EEG, will transfer to [place] medication loaded with Keppra 1.5 g, fosphenytoin 400 mg.  Neurology will continue to follow.

## 2022-04-26 NOTE — Progress Notes (Signed)
Patient has a bed at [place] at [facility].  [facility] called.  Patient's daughter, [name], made aware.  Report called.  Oncoming nurse made aware.

## 2022-04-26 NOTE — Evaluation (Signed)
Occupational Therapy Evaluation Patient Details Name: [name] MRN: [id number] DOB: [birth year] Today's Date: 04/26/2022   History of Present Illness Pt is an 86yo female presenting s/p laparoscopic cholecystectomy on 04/23/22. Pt resides at [facility]. Presenting with post-anesthesia change in cognitive status. PMH: afib with pacemaker, HTN, epilepsy, osteopenia.   Clinical Impression   Pt admitted as above, presenting with deficits as listed below (please refer to OT problem list below). PLOF obtained via pt daughter and chart review. Pt is currently unable to follow 1 step commands and is dependent/total assist for ADL's and self care at this time. Prior to this admission, pt was living at [facility] and was getting assist for bathing, but was ambulating with a rollator to/from dining area and in room, she was dressing, grooming and feeding herself at Mod I level as well. She may benefit from trial of OT acutely to see if her condition improves and she is able to participate in skilled therapy sessions. Recommend use of lift equipment at this time for pt/staff safety as she is total assist/dependent for ADL's and self care at this time.     Recommendations for follow up therapy are one component of a multi-disciplinary discharge planning process, led by the attending physician.  Recommendations may be updated based on patient status, additional functional criteria and insurance authorization.   Follow Up Recommendations  Skilled nursing-short term rehab (<3 hours/day)     Assistance Recommended at Discharge Frequent or constant Supervision/Assistance  Patient can return home with the following Two people to help with bathing/dressing/bathroom;Assistance with cooking/housework;Assistance with feeding;Direct supervision/assist for medications management;Two people to help with walking and/or transfers    Functional Status Assessment  Patient has had a recent decline in their  functional status and/or demonstrates limited ability to make significant improvements in function in a reasonable and predictable amount of time  Equipment Recommendations  Other (comment) (Defer to next venue)    Recommendations for Other Services       Precautions / Restrictions Precautions Precautions: Fall      Mobility Bed Mobility Overal bed mobility: Needs Assistance (Dependent: use lift for safety)   General bed mobility comments: Attempted/pt unable to follow commands, Use lift equipment if needed to mobilize as pt is total assist.    Transfers   General transfer comment: Pt unable to perform transfers/dependent - use lift equipment for safety secondary to pt being total assist at this time.      Balance  Unable/total assist   ADL either performed or assessed with clinical judgement   ADL Overall ADL's : Needs assistance/impaired Eating/Feeding: Total assistance Eating/Feeding Details (indicate cue type and reason): Per pt daughter, RN and SLP notes, pt with changes in swallowing. Grooming: Bed level;Wash/dry face;Maximal assistance;Total assistance   Upper Body Bathing: Total assistance;Bed level   Lower Body Bathing: Total assistance;Bed level   Upper Body Dressing : Total assistance;Bed level   Lower Body Dressing: Total assistance;Bed level;+2 for physical assistance;+2 for safety/equipment     Toilet Transfer Details (indicate cue type and reason): Bed level - pt unable to perform any transfers safely at this time. She is dependent for all aspects of toileting Toileting- Clothing Manipulation and Hygiene: Total assistance;+2 for physical assistance;+2 for safety/equipment;Bed level     Tub/Shower Transfer Details (indicate cue type and reason): Unable - dependent Functional mobility during ADLs:  (Pt unable/dependent to perform transfers or sit EOB safely at this time. Recommend use of  lift for safety with transfers)  General ADL Comments: Pt seen for  OT assessment this date. Attempted grooming and hand over hand assist to wash face or hands, pt unable. Pt is currently unable to follow 1 step commands and is dependent/total assist for ADL's and self care at this time. Prior to this admission, pt was living at [facility] and was getting assist for bathing, but was ambulating with a rollator to/from dining area and in room, she was dressing, grooming and feeding herself at Mod I level as well. She may benefit from trial of OT acutely to see if her condition improves and she is able to participate in skilled therapy sessions. Recommend use of lift equipment at this time for pt/staff safety as she is total assist/dependent for ADL's and self care at this time.     Vision Baseline Vision/History:  (Difficult to assess secondary to cognition and expressive difficulties) Vision Assessment?:  (See comment above)            Pertinent Vitals/Pain Pain Assessment Pain Assessment: Faces Faces Pain Scale: No hurt     Hand Dominance Right   Extremity/Trunk Assessment Upper Extremity Assessment Upper Extremity Assessment: Difficult to assess due to impaired cognition;Generalized weakness (Pt allows OT to take both UEs through gentle PROM, but otherwise unable to follow commands for MMT or functional testing. Arthritis noted L>R hand. Bilateral shoulders limited in end range passive ROM for shoulder flexion)   Lower Extremity Assessment Lower Extremity Assessment: Defer to PT evaluation   Cervical / Trunk Assessment Cervical / Trunk Assessment: Kyphotic   Communication Communication Communication: Receptive difficulties;Expressive difficulties (Pt appears to be struggling with the expressive side of communication. She replies "Yes" and "okay" to all questions asked this session)   Cognition Arousal/Alertness: Awake/alert Behavior During Therapy: WFL for tasks assessed/performed Overall Cognitive Status: Impaired/Different from baseline Area  of Impairment: Orientation, Attention, Memory, Safety/judgement, Following commands   Orientation Level: Disoriented to, Place, Time, Situation Current Attention Level: Focused   Following Commands: Follows one step commands inconsistently Safety/Judgement: Decreased awareness of deficits, Decreased awareness of safety     General Comments: Per chart review and pt's daughter, pt conversant and pleasant two-three days before. At time of evaluation, pt only able to nod her head and say "okay" or "yes."     General Comments  PLOF and home environment information obtained from chart review and pt's daughter [name]).            Home Living Family/patient expects to be discharged to:: Assisted living   Home Equipment: Rollator (4 wheels)   Additional Comments: Pt lives at [facility] garden apartment. Per daughter, pt is largely modified independent.      Prior Functioning/Environment Prior Level of Function : Independent/Modified Independent   Mobility Comments: Pt's daughter reports ind with rollator walker. No falls in last 6 months. ADLs Comments: Pt's daughter reports facility cleans apartment 1x/week, provides meals and medications, requires assist with bathing.        OT Problem List: Impaired balance (sitting and/or standing);Decreased knowledge of use of DME or AE;Impaired UE functional use;Decreased activity tolerance      OT Treatment/Interventions: Self-care/ADL training;Patient/family education;Therapeutic exercise;Therapeutic activities;Cognitive remediation/compensation;DME and/or AE instruction    OT Goals(Current goals can be found in the care plan section) Acute Rehab OT Goals Patient Stated Goal: Unable to state OT Goal Formulation: Patient unable to participate in goal setting Time For Goal Achievement: 05/10/22 Potential to Achieve Goals: Fair  OT Frequency: Min 2X/week  AM-PAC OT "6 Clicks" Daily Activity     Outcome Measure Help from another  person eating meals?: Total Help from another person taking care of personal grooming?: Total Help from another person toileting, which includes using toliet, bedpan, or urinal?: Total Help from another person bathing (including washing, rinsing, drying)?: Total Help from another person to put on and taking off regular upper body clothing?: Total Help from another person to put on and taking off regular lower body clothing?: Total 6 Click Score: 6   End of Session Nurse Communication: Need for lift equipment  Activity Tolerance: Other (comment) (Pt limited secondary to cognition at this time) Patient left: in bed;with call bell/phone within reach;with bed alarm set;with family/visitor present  OT Visit Diagnosis: Other symptoms and signs involving cognitive function;Other abnormalities of gait and mobility (R26.89)                Time: 0223-3612 OT Time Calculation (min): 19 min Charges:  OT General Charges $OT Visit: 1 Visit OT Evaluation $OT Eval Low Complexity: 1 Low  Myrl Lazarus Beth Dixon, OTR/L 04/26/2022, 9:44 AM

## 2022-04-26 NOTE — Progress Notes (Signed)
EEG complete - results pending 

## 2022-04-26 NOTE — Progress Notes (Signed)
SLP Cancellation Note  Patient Details Name: [name] MRN: [id number] DOB: 02/18/1936   Cancelled treatment:       Reason Eval/Treat Not Completed: Other (comment) (pt working with IV team to get Iv, will continue efforts) Kathleen Lime, MS [facility] SLP Acute Rehab Services Office [phone] Pager [phone]   Macario Golds 04/26/2022, 11:46 AM

## 2022-04-26 NOTE — Progress Notes (Signed)
Received female pt from [facility] for continuous EEG, transferred pt to room 31, activities well tolerated, connected pt to tele  box verified by [employer], pt just kept on responding OK, good, I am sorry for all the simple question being asked, with IV cannula on right forearm x 2, with O2 via [place] at 2lpm, not in distress, with initial O2Sat= 100%, with foley catheter to bag, with yellow urine, as reported it was inserted due to urinary retention,, peri and foley care care done.skin integrity assessed, noted with blanchable redness on sacral area to anal part, witnessed by RN Crystal, foam dressing applied, orient pt to room and used of call bell, and responded OK, asked to press call bell but only said OK, not following command

## 2022-04-26 NOTE — Procedures (Signed)
Patient Name: [name]  MRN: [id number]  Epilepsy Attending: Lora Havens  Referring Physician/Provider: Barb Merino, MD  Date:04/26/2022 Duration: 23.22 mins  Patient history: 86yo F with ams. EEG to evaluate for seizure  Level of alertness: Awake  AEDs during EEG study: LEV  Technical aspects: This EEG study was done with scalp electrodes positioned according to the 10-20 International system of electrode placement. Electrical activity was reviewed with band pass filter of 1-'70Hz'$ , sensitivity of 7 uV/mm, display speed of 55m/sec with a '60Hz'$  notched filter applied as appropriate. EEG data were recorded continuously and digitally stored.  Video monitoring was available and reviewed as appropriate.  Description: EEG showed continuous generalized 3 to 6 Hz theta-delta slowing. Hyperventilation and photic stimulation were not performed.     Of note, study was technically difficult due to significant movement and myogenic artifact.  ABNORMALITY - Continuous slow, generalized  IMPRESSION: This technically difficult study is suggestive of moderate diffuse encephalopathy, nonspecific etiology.  No seizures or epileptiform discharges were seen throughout the recording.   Whitfield Dulay OBarbra Sarks

## 2022-04-26 NOTE — Progress Notes (Signed)
PROGRESS NOTE    [name]  QBH:[id number] DOB: 11-08-1935 DOA: 04/22/2022 PCP: Virgie Dad, MD    Brief Narrative:  86 year old from assisted living facility with history of permanent A-fib status post pacemaker, hypertension, complex partial epilepsy since childhood presented with 1 days of right upper quadrant abdominal pain and she was found to have calculus cholecystitis.  She was admitted to the hospital with IV fluids, antibiotics.  Underwent laparoscopic cholecystectomy with grossly infected gallbladder.  Good clinical recovery.  11/15.  After appropriate recovery from surgery, she started developing word finding difficulties and confusion.  Symptoms significantly worse overnight.  No focal deficits.  Persistently encephalopathic.   Assessment & Plan:   Acute metabolic encephalopathy, confusion and word finding difficulty, not present on admission.  Patient with history of childhood seizure. Likely decreased seizure threshold due to surgical stress, missing medications and subtherapeutic levels with very large left frontal lobe mass. Electrolytes, sodium, potassium, magnesium and phosphorus within normal limits. B12 is adequate. TSH 4 3.  Urinalysis normal.  Ammonia is normal. Head CT scan and subsequent MRI showed stable AV malformation left frontal lobe 5 x 5 cm in size. Subclinical seizure remains in differential diagnosis. Phenytoin and phenobarbital serum levels subtherapeutic. Providing injectable phenytoin and phenobarbital. Loading with Keppra 1500 mg once now. Routine EEG today, if unable to localize any seizures, will need LTM EEG and transfer to [facility]. Called and discussed with neurology, they will see her in follow-up. Aspiration precautions.  Continue to attempt to work with PT OT. Cefepime discontinued as it can cause or worsen encephalopathy. Supportive treatment, delirium precautions.  Avoid narcotics and benzos.  Cholecystitis with  cholelithiasis: Lap chole day 3 postop.  Surgically stable.    Hypomagnesemia: Severe and persistent.  Presentation magnesium 1.1.  Adequate.  Will discharge on magnesium replacement.  Chronic medical issues including essential hypertension, hyperlipidemia.  Unchanged.  No changes in medication done.   DVT prophylaxis: enoxaparin (LOVENOX) injection 40 mg Start: 04/24/22 1000   Code Status: DNR Family Communication: Daughter at the bedside Disposition Plan: Status is: Inpatient Remains inpatient appropriate because: Altered mental status.     Consultants:  General surgery  Procedures:  Lap chole  Antimicrobials:  Cefepime and Flagyl 11/12---11/15   Subjective:  Patient seen and examined.  Still with lack of coordination and word finding difficulties.  She was looking uncomfortable and crying.  Unable to express but she says yes or no.  She told me yes when I asked her if she has headache.  Repeating words.  Did not witness any evidence of seizure-like episodes, generalized tonic-clonic episodes.  No incontinence or tongue biting. Patient is unable to follow commands.  Objective: Vitals:   04/25/22 1428 04/25/22 2038 04/26/22 0349 04/26/22 0440  BP: (!) 166/47 (!) 153/40 (!) 174/46 (!) 179/54  Pulse: 60 60  (!) 59  Resp: (!) '21 20  16  '$ Temp: 98.3 F (36.8 C) 98 F (36.7 C) 98.4 F (36.9 C) 97.9 F (36.6 C)  TempSrc: Oral Oral Oral Oral  SpO2:  99%  100%  Weight:      Height:        Intake/Output Summary (Last 24 hours) at 04/26/2022 1129 Last data filed at 04/26/2022 0500 Gross per 24 hour  Intake 566.91 ml  Output 500 ml  Net 66.91 ml    Filed Weights   04/22/22 0406  Weight: 62.1 kg    Examination:  General exam: Appears anxious and uncomfortable on initial exam.  She was able to be calmed down by talking to her. Both pupils are pinpoint but reactive to light. Patient unable to reliably follow any commands.  She has mild asymmetrical face, unknown  whether this is chronic. Does not follow commands appropriately but Strength is equal in all extremities. Unable to express, however her words are fluent and clear. Respiratory system: Clear to auscultation. Respiratory effort normal. Cardiovascular system: S1 & S2 heard, RRR.  Pacemaker in place.   Gastrointestinal system: Soft. Port site clean and dry.  Nontender on exam. Extremities: Symmetric 5 x 5 power.  Moves all extremities. Skin: No rashes, lesions or ulcers Psychiatry: Judgement and insight appear compromised. Mood & affect anxious.    Data Reviewed: I have personally reviewed following labs and imaging studies  CBC: Recent Labs  Lab 04/22/22 0434 04/23/22 0811 04/24/22 0504 04/25/22 0735 04/26/22 0901  WBC 13.3* 15.4* 11.3* 8.9 5.7  NEUTROABS 11.9*  --  10.0* 7.6  --   HGB 10.2* 9.6* 9.1* 9.5* 10.2*  HCT 31.7* 29.3* 28.1* 29.3* 32.4*  MCV 104.3* 103.2* 104.5* 103.5* 106.2*  PLT 288 249 237 274 546    Basic Metabolic Panel: Recent Labs  Lab 04/22/22 0434 04/22/22 1826 04/23/22 0811 04/24/22 0504 04/25/22 0735 04/26/22 0901  NA 141  --  136 136 135 136  K 3.7  --  4.2 3.7 3.7 3.8  CL 105  --  101 103 103 105  CO2 27  --  25 22 19* 21*  GLUCOSE 140*  --  116* 91 89 98  BUN 25*  --  25* 29* 29* 27*  CREATININE 1.05*  --  1.25* 1.14* 1.32* 1.24*  CALCIUM 8.2* 8.1* 8.0* 8.1* 8.4* 8.6*  MG  --  1.1*  --  1.8 1.9  --   PHOS  --   --   --  4.4 3.5  --     GFR: Estimated Creatinine Clearance: 31.6 mL/min (A) (by C-G formula based on SCr of 1.24 mg/dL (H)). Liver Function Tests: Recent Labs  Lab 04/22/22 0434 04/23/22 0811 04/24/22 0504 04/25/22 0735 04/26/22 0901  AST '29 25 24 21 18  '$ ALT '18 16 16 15 12  '$ ALKPHOS 68 58 52 53 54  BILITOT 0.6 0.9 0.7 1.1 0.9  PROT 7.0 6.3* 5.8* 5.9* 5.9*  ALBUMIN 3.7 3.2* 2.8* 2.8* 2.6*    Recent Labs  Lab 04/22/22 0434  LIPASE 42    Recent Labs  Lab 04/26/22 0901  AMMONIA 18   Coagulation Profile: No  results for input(s): "INR", "PROTIME" in the last 168 hours. Cardiac Enzymes: No results for input(s): "CKTOTAL", "CKMB", "CKMBINDEX", "TROPONINI" in the last 168 hours. BNP (last 3 results) No results for input(s): "PROBNP" in the last 8760 hours. HbA1C: No results for input(s): "HGBA1C" in the last 72 hours.  CBG: Recent Labs  Lab 04/23/22 2037  GLUCAP 122*    Lipid Profile: No results for input(s): "CHOL", "HDL", "LDLCALC", "TRIG", "CHOLHDL", "LDLDIRECT" in the last 72 hours. Thyroid Function Tests: Recent Labs    04/25/22 0735  TSH 4.325    Anemia Panel: Recent Labs    04/25/22 0735  VITAMINB12 663    Sepsis Labs: No results for input(s): "PROCALCITON", "LATICACIDVEN" in the last 168 hours.  No results found for this or any previous visit (from the past 240 hour(s)).       Radiology Studies: MR BRAIN WO CONTRAST  Result Date: 04/25/2022 CLINICAL DATA:  Mental status change.  Confusion. EXAM: MRI  HEAD WITHOUT CONTRAST TECHNIQUE: Multiplanar, multiecho pulse sequences of the brain and surrounding structures were obtained without intravenous contrast. COMPARISON:  CT head 04/25/2022 FINDINGS: Brain: Large lesion left frontal lobe measuring approximately 4.7 x 5.1 cm. This is calcified on CT with multiple flow voids and dilated vessels on MRI. There is a large draining superficial cortical vein. The left anterior cerebral artery is enlarged and appears to be the primary supply to the presumed AVM. Enlarged veins also present within the left lateral ventricle. No evidence of prior hemorrhage. Negative for hydrocephalus. Generalized atrophy. Negative for acute infarct. Vascular: Flow voids are present in the circle-of-Willis. 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 There is also a possible aneurysm of the anterior communicating artery. Large AVM left frontal lobe as described above. Skull and upper cervical spine: No focal  skeletal abnormality. Sinuses/Orbits: Mucosal edema left maxillary sinus. Manning sinuses clear. Bilateral cataract extraction Other: None IMPRESSION: 1. Large AVM left frontal lobe measuring 4.7 x 5.1 cm. There is a large draining superficial cortical vein. The left anterior cerebral artery is enlarged and appears to be the primary supply to the AVM. No evidence of prior hemorrhage. 2. 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 3. Atrophy. No acute infarct. Electronically Signed   By: Franchot Gallo M.D.   On: 04/25/2022 14:32   CT HEAD WO CONTRAST (5MM)  Result Date: 04/25/2022 CLINICAL DATA:  Mental status change. EXAM: CT HEAD WITHOUT CONTRAST TECHNIQUE: Contiguous axial images were obtained from the base of the skull through the vertex without intravenous contrast. RADIATION DOSE REDUCTION: This exam was performed according to the departmental dose-optimization program which includes automated exposure control, adjustment of the mA and/or kV according to patient size and/or use of iterative reconstruction technique. COMPARISON:  Head CT 10/15/2021 FINDINGS: Brain: Stable large partially calcified left frontal lobe process likely chronic AVM. No surrounding edema to suggest infarction or inflammation. The ventricles are stable in size and configuration and in the midline without mass effect or shift. No extra-axial fluid collections are identified. No CT findings for acute hemispheric infarction or intracranial hemorrhage. No mass lesions. The brainstem and cerebellum are grossly normal. Vascular: Stable vascular calcifications.  No hyperdense vessels. Skull: No skull fracture or bone lesions. Sinuses/Orbits: The paranasal sinuses and mastoid air cells are clear except for fluid in the left maxillary sinus. The globes are intact. Other: Extensive calcification noted around the annular ligament at C1-2 and there is extensive calcification involving the  temporomandibular joints. Findings suggest CPPD arthropathy. IMPRESSION: 1. Stable large partially calcified left frontal lobe process likely chronic AVM. 2. No acute intracranial findings. 3. Fluid in the left maxillary sinus. 4. Chronic changes of CPPD arthropathy. Electronically Signed   By: Marijo Sanes M.D.   On: 04/25/2022 11:46        Scheduled Meds:  enoxaparin (LOVENOX) injection  40 mg Subcutaneous Q24H   hydrALAZINE  25 mg Oral BID   irbesartan  150 mg Oral Daily   magnesium oxide  400 mg Oral BID   phenytoin (DILANTIN) IV  100 mg Intravenous Q12H   Continuous Infusions:  acetaminophen     lactated ringers 75 mL/hr at 04/25/22 2132   levETIRAcetam     metronidazole 500 mg (04/26/22 0348)     LOS: 2 days    Time spent: 35 minutes    Barb Merino, MD [facility] Pager [phone]

## 2022-04-26 NOTE — Consult Note (Addendum)
Neurology Consultation  Reason for Consult: Speech difficulty Referring Physician: Dr Sloan Leiter  CC: confusion and speech difficulty  History is obtained from: Patient, chart  HPI: [name] is a 86 y.o. female past medical history of a large left frontal AVM, hypercholesterolemia, hypertension, pseudogout, documented carotid disease, spinal stenosis, who was admitted for evaluation of abdominal pain, found to have acute cholecystitis, got laparoscopic cholecystectomy on 04/23/2022 and at some point on 04/25/2022, had sudden onset difficulty with her speech and confusion.  Difficulty with speech is described as word finding problems, speech is mainly of short sentences which make complete sense but are not in context with the conversation being had.  She seems to not be able to comprehend other peoples commands. MRI of the brain was done and was negative for acute stroke. She has been on Dilantin and phenobarbital since the age of 89, possibly because of the AVM and had some missed doses of the medications.  I was called yesterday and recommended checking an EEG as well as drug levels.  Dilantin was subtherapeutic.  Phenobarbital was therapeutic.  EEG did not show any evidence of ongoing seizures. Symptoms persist-for which formal neurological consultation was obtained. I saw and evaluated her at [facility] with her daughter at the bedside.  LKW: Sometime on 04/24/2022 IV thrombolysis given?: no,-MRI negative for stroke Premorbid modified Rankin scale (mRS): 2 ROS: Full ROS was performed and is negative except as noted in the HPI.   Past Medical History:  Diagnosis Date   Abnormal cardiovascular stress test    Carotid arterial disease (HCC)    MODERATE RIGHT   Cerebral AV malformation    DDD (degenerative disc disease)    SEVERE WITH SPINAL STENOSIS   Hypercholesterolemia    Hypertension    MVP (mitral valve prolapse)    MILD   Numbness    LEFT FOOT AND ANKLE    Osteopenia    Presence of permanent cardiac pacemaker    Pseudo-gout      Family History  Problem Relation Age of Onset   Stroke Mother    Lung cancer Father    Breast cancer Neg Hx      Social History:   reports that she quit smoking about 53 years ago. Her smoking use included cigarettes. She has a 14.00 pack-year smoking history. She has never used smokeless tobacco. She reports that she does not drink alcohol and does not use drugs.  Medications  Current Facility-Administered Medications:    acetaminophen (OFIRMEV) IV 1,000 mg, 1,000 mg, Intravenous, Q6H, Ghimire, Kuber, MD   acetaminophen (TYLENOL) 160 MG/5ML solution 1,000 mg, 1,000 mg, Oral, Q6H PRN, Barb Merino, MD   diphenhydrAMINE (BENADRYL) injection 12.5 mg, 12.5 mg, Intravenous, Q6H PRN, Meuth, Brooke A, PA-C   enoxaparin (LOVENOX) injection 40 mg, 40 mg, Subcutaneous, Q24H, Meuth, Brooke A, PA-C, 40 mg at 04/25/22 1517   hydrALAZINE (APRESOLINE) injection 10 mg, 10 mg, Intravenous, Q4H PRN, Barb Merino, MD   hydrALAZINE (APRESOLINE) tablet 25 mg, 25 mg, Oral, BID, Meuth, Brooke A, PA-C, 25 mg at 04/24/22 2102   irbesartan (AVAPRO) tablet 150 mg, 150 mg, Oral, Daily, Meuth, Brooke A, PA-C, 150 mg at 04/24/22 8916   lactated ringers infusion, , Intravenous, Continuous, Ghimire, Kuber, MD, Last Rate: 75 mL/hr at 04/25/22 2132, New Bag at 04/25/22 2132   levETIRAcetam (KEPPRA) IVPB 1500 mg/ 100 mL premix, 1,500 mg, Intravenous, Once, Ghimire, Dante Gang, MD   magnesium oxide (MAG-OX) tablet 400 mg, 400 mg, Oral,  BID, Barb Merino, MD, 400 mg at 04/24/22 2102   metoprolol tartrate (LOPRESSOR) injection 5 mg, 5 mg, Intravenous, Q6H PRN, Meuth, Brooke A, PA-C, 5 mg at 04/22/22 1830   metroNIDAZOLE (FLAGYL) IVPB 500 mg, 500 mg, Intravenous, Q12H, Barb Merino, MD, Last Rate: 100 mL/hr at 04/26/22 0348, 500 mg at 04/26/22 0348   morphine (PF) 2 MG/ML injection 2 mg, 2 mg, Intravenous, Q3H PRN, Meuth, Brooke A, PA-C, 2 mg at  04/25/22 1312   Oral care mouth rinse, 15 mL, Mouth Rinse, PRN, Barb Merino, MD   oxyCODONE (Oxy IR/ROXICODONE) immediate release tablet 2.5-5 mg, 2.5-5 mg, Oral, Q4H PRN, Meuth, Brooke A, PA-C   phenytoin (DILANTIN) injection 100 mg, 100 mg, Intravenous, Q12H, Ghimire, Dante Gang, MD, 100 mg at 04/26/22 1100   prochlorperazine (COMPAZINE) injection 10 mg, 10 mg, Intravenous, Q6H PRN, Meuth, Brooke A, PA-C, 10 mg at 04/22/22 1830   Exam: Current vital signs: BP (!) 179/54 (BP Location: Right Arm)   Pulse (!) 59   Temp 97.9 F (36.6 C) (Oral)   Resp 16   Ht '5\' 6"'$  (1.676 m)   Wt 62.1 kg   SpO2 100%   BMI 22.11 kg/m  Vital signs in last 24 hours: Temp:  [97.9 F (36.6 C)-98.4 F (36.9 C)] 97.9 F (36.6 C) (11/16 0440) Pulse Rate:  [59-60] 59 (11/16 0440) Resp:  [16-21] 16 (11/16 0440) BP: (153-179)/(40-54) 179/54 (11/16 0440) SpO2:  [99 %-100 %] 100 % (11/16 0440) Gen: Awake alert in no distress HEENT: Normocephalic and atraumatic Lungs are clear Extremities warm well perfused Neurological exam She is awake alert Upon asking him her name, she says okay and yes. Perseverates on okay and yes No frank dysarthria Not able to name, comprehend or repeat. Cranial nerves II to XII intact Motor examination with right upper extremity weakness which has a mild drift but the daughter says that she had a fracture there.  At baseline she is able to use that arm without difficulty.  Appears weaker than baseline.  Both lower extremities fall down to the bed as she is unable to follow commands to keep them up against gravity.  Left upper extremity-she is able to hold against gravity for a few seconds but does not follow commands to keep her dog to 10 seconds. Sensation: Grimaces to noxious stimulation and withdraws to noxious stimulation equally on all fours Coordination difficult to assess given her mentation and inability to follow  Labs I have reviewed labs in epic and the results pertinent  to this consultation are:  CBC    Component Value Date/Time   WBC 5.7 04/26/2022 0901   RBC 3.05 (L) 04/26/2022 0901   HGB 10.2 (L) 04/26/2022 0901   HCT 32.4 (L) 04/26/2022 0901   PLT 283 04/26/2022 0901   MCV 106.2 (H) 04/26/2022 0901   MCH 33.4 04/26/2022 0901   MCHC 31.5 04/26/2022 0901   RDW 15.2 04/26/2022 0901   LYMPHSABS 0.4 (L) 04/25/2022 0735   MONOABS 0.8 04/25/2022 0735   EOSABS 0.0 04/25/2022 0735   BASOSABS 0.0 04/25/2022 0735    CMP     Component Value Date/Time   NA 136 04/26/2022 0901   NA 137 04/03/2022 0000   K 3.8 04/26/2022 0901   CL 105 04/26/2022 0901   CO2 21 (L) 04/26/2022 0901   GLUCOSE 98 04/26/2022 0901   BUN 27 (H) 04/26/2022 0901   BUN 19 04/03/2022 0000   CREATININE 1.24 (H) 04/26/2022 0901   CREATININE 1.26 (  H) 06/21/2021 1430   CALCIUM 8.6 (L) 04/26/2022 0901   CALCIUM 8.1 (L) 04/22/2022 1826   PROT 5.9 (L) 04/26/2022 0901   ALBUMIN 2.6 (L) 04/26/2022 0901   AST 18 04/26/2022 0901   ALT 12 04/26/2022 0901   ALKPHOS 54 04/26/2022 0901   BILITOT 0.9 04/26/2022 0901   GFRNONAA 43 (L) 04/26/2022 0901   GFRAA 53.34 04/25/2021 0000   Dilantin yesterday was 2.6, today is 4.0 Phenobarbital yesterday 18.9  Neurodiagnostics EEG with generalized slowing.  No evidence of seizures  Imaging I have reviewed the images obtained: MRI examination of the brain-large left frontal AVM.  No stroke.  Assessment: 86/M with past medical history that includes that of seizures due to possible left frontal large AVM that she has had all her life and has been on Dilantin and phenobarbital since the age of 71, status postcholecystectomy started having confusion and global aphasia.  MRI is negative for acute stroke. EEG is negative for seizures-shows generalized slowing only. My suspicion is that she has underlying seizures that have not been captured on EEG or has intermittent seizures with prolonged postictal state leading to speech problems of  aphasia. She would benefit from LTM EEG. She did miss some antiepileptic doses and her Dilantin level is subtherapeutic which might have contributed to seizures.  Also she is on antibiotics that could lower seizure threshold.   Impression: Likely continuing breakthrough seizures in the setting of missed doses and surgery as well as antibiotics that might lower seizure threshold. Evaluate for underlying status epilepticus with predominant speech symptoms given the underlying structural lesion-large AVM in the left frontal lobe.  Recommendations: Transfer to [facility] as soon as possible for LTM LTM EEG once at [place] load 1500 mg x 1 Start Keppra 500 twice daily from tonight She is on Flagyl-can reduce seizure threshold-would recommend choosing an alternative to Flagyl at this time. I will also do a loading dose of fosphenytoin ('400mg'$ ) to get it to therapeutic level ~15 and then start back on the home dose of 100 mg Dilantin twice daily. Continue home dose phenobarb Do anti epileptic  medications IV until  she is cleared for p.o. and is taking p.o. meds consistently at that time switch to p.o. Plan was discussed in detail with Dr. Sloan Leiter in person.  -- Amie Portland, MD Neurologist [facility] Pager: [phone]

## 2022-04-26 NOTE — Progress Notes (Signed)
Progress Note  3 Days Post-Op  Subjective: Pt with AMS still. Workup unrevealing so far. Repeatedly saying "okay" this AM but unable to follow commands. Daughter at bedside as well.   Objective: Vital signs in last 24 hours: Temp:  [97.9 F (36.6 C)-98.4 F (36.9 C)] 97.9 F (36.6 C) (11/16 0440) Pulse Rate:  [59-60] 59 (11/16 0440) Resp:  [16-21] 16 (11/16 0440) BP: (153-179)/(40-54) 179/54 (11/16 0440) SpO2:  [99 %-100 %] 100 % (11/16 0440) Last BM Date : 04/22/22  Intake/Output from previous day: 11/15 0701 - 11/16 0700 In: 566.9 [I.V.:466.9; IV Piggyback:100] Out: 900 [Urine:900] Intake/Output this shift: No intake/output data recorded.  PE: General: pleasant, WD, elderly female who is lying in bed  HEENT: sclera anicteric  Heart: regular, rate, and rhythm Lungs: Respiratory effort nonlabored Abd: soft, mild grimace on palpation of RUQ, ND, incisions C/D/I Skin: warm and dry with no masses, lesions, or rashes Neuro: unable to [facility], pupils equal and round and reactive BL, alert but not oriented    Lab Results:  Recent Labs    04/24/22 0504 04/25/22 0735  WBC 11.3* 8.9  HGB 9.1* 9.5*  HCT 28.1* 29.3*  PLT 237 274    BMET Recent Labs    04/24/22 0504 04/25/22 0735  NA 136 135  K 3.7 3.7  CL 103 103  CO2 22 19*  GLUCOSE 91 89  BUN 29* 29*  CREATININE 1.14* 1.32*  CALCIUM 8.1* 8.4*    PT/INR No results for input(s): "LABPROT", "INR" in the last 72 hours. CMP     Component Value Date/Time   NA 135 04/25/2022 0735   NA 137 04/03/2022 0000   K 3.7 04/25/2022 0735   CL 103 04/25/2022 0735   CO2 19 (L) 04/25/2022 0735   GLUCOSE 89 04/25/2022 0735   BUN 29 (H) 04/25/2022 0735   BUN 19 04/03/2022 0000   CREATININE 1.32 (H) 04/25/2022 0735   CREATININE 1.26 (H) 06/21/2021 1430   CALCIUM 8.4 (L) 04/25/2022 0735   CALCIUM 8.1 (L) 04/22/2022 1826   PROT 5.9 (L) 04/25/2022 0735   ALBUMIN 2.8 (L) 04/25/2022 0735   AST 21 04/25/2022 0735   ALT 15  04/25/2022 0735   ALKPHOS 53 04/25/2022 0735   BILITOT 1.1 04/25/2022 0735   GFRNONAA 40 (L) 04/25/2022 0735   GFRAA 53.34 04/25/2021 0000   Lipase     Component Value Date/Time   LIPASE 42 04/22/2022 0434       Studies/Results: MR BRAIN WO CONTRAST  Result Date: 04/25/2022 CLINICAL DATA:  Mental status change.  Confusion. EXAM: MRI HEAD WITHOUT CONTRAST TECHNIQUE: Multiplanar, multiecho pulse sequences of the brain and surrounding structures were obtained without intravenous contrast. COMPARISON:  CT head 04/25/2022 FINDINGS: Brain: Large lesion left frontal lobe measuring approximately 4.7 x 5.1 cm. This is calcified on CT with multiple flow voids and dilated vessels on MRI. There is a large draining superficial cortical vein. The left anterior cerebral artery is enlarged and appears to be the primary supply to the presumed AVM. Enlarged veins also present within the left lateral ventricle. No evidence of prior hemorrhage. Negative for hydrocephalus. Generalized atrophy. Negative for acute infarct. Vascular: Flow voids are present in the circle-of-Willis. 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 There is also a possible aneurysm of the anterior communicating artery. Large AVM left frontal lobe as described above. Skull and upper cervical spine: No focal skeletal abnormality. Sinuses/Orbits: Mucosal edema left maxillary  sinus. Manning sinuses clear. Bilateral cataract extraction Other: None IMPRESSION: 1. Large AVM left frontal lobe measuring 4.7 x 5.1 cm. There is a large draining superficial cortical vein. The left anterior cerebral artery is enlarged and appears to be the primary supply to the AVM. No evidence of prior hemorrhage. 2. 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 3. Atrophy. No acute infarct. Electronically Signed   By: Franchot Gallo M.D.   On: 04/25/2022 14:32   CT HEAD WO CONTRAST  (5MM)  Result Date: 04/25/2022 CLINICAL DATA:  Mental status change. EXAM: CT HEAD WITHOUT CONTRAST TECHNIQUE: Contiguous axial images were obtained from the base of the skull through the vertex without intravenous contrast. RADIATION DOSE REDUCTION: This exam was performed according to the departmental dose-optimization program which includes automated exposure control, adjustment of the mA and/or kV according to patient size and/or use of iterative reconstruction technique. COMPARISON:  Head CT 10/15/2021 FINDINGS: Brain: Stable large partially calcified left frontal lobe process likely chronic AVM. No surrounding edema to suggest infarction or inflammation. The ventricles are stable in size and configuration and in the midline without mass effect or shift. No extra-axial fluid collections are identified. No CT findings for acute hemispheric infarction or intracranial hemorrhage. No mass lesions. The brainstem and cerebellum are grossly normal. Vascular: Stable vascular calcifications.  No hyperdense vessels. Skull: No skull fracture or bone lesions. Sinuses/Orbits: The paranasal sinuses and mastoid air cells are clear except for fluid in the left maxillary sinus. The globes are intact. Other: Extensive calcification noted around the annular ligament at C1-2 and there is extensive calcification involving the temporomandibular joints. Findings suggest CPPD arthropathy. IMPRESSION: 1. Stable large partially calcified left frontal lobe process likely chronic AVM. 2. No acute intracranial findings. 3. Fluid in the left maxillary sinus. 4. Chronic changes of CPPD arthropathy. Electronically Signed   By: Marijo Sanes M.D.   On: 04/25/2022 11:46    Anti-infectives: Anti-infectives (From admission, onward)    Start     Dose/Rate Route Frequency Ordered Stop   04/26/22 0300  metroNIDAZOLE (FLAGYL) IVPB 500 mg        500 mg 100 mL/hr over 60 Minutes Intravenous Every 12 hours 04/25/22 1556     04/24/22 0000   cefdinir (OMNICEF) 300 MG capsule        300 mg Oral 2 times daily 04/24/22 1031 04/29/22 2359   04/24/22 0000  metroNIDAZOLE (FLAGYL) 500 MG tablet        500 mg Oral 2 times daily 04/24/22 1031 04/29/22 2359   04/22/22 2200  metroNIDAZOLE (FLAGYL) IVPB 500 mg  Status:  Discontinued        500 mg 100 mL/hr over 60 Minutes Intravenous Every 12 hours 04/22/22 1732 04/25/22 1556   04/22/22 2200  ceFEPIme (MAXIPIME) 2 g in sodium chloride 0.9 % 100 mL IVPB  Status:  Discontinued        2 g 200 mL/hr over 30 Minutes Intravenous Every 12 hours 04/22/22 0946 04/25/22 0728   04/22/22 0830  ceFEPIme (MAXIPIME) 2 g in sodium chloride 0.9 % 100 mL IVPB       See Hyperspace for full Linked Orders Report.   2 g 200 mL/hr over 30 Minutes Intravenous  Once 04/22/22 0825 04/22/22 0917   04/22/22 0830  metroNIDAZOLE (FLAGYL) IVPB 500 mg       See Hyperspace for full Linked Orders Report.   500 mg 100 mL/hr over 60 Minutes Intravenous  Once  04/22/22 0825 04/22/22 1025        Assessment/Plan  Acute gangrenous cholecystitis  POD3 S/P laparoscopic cholecystectomy - abdominal exam pretty benign - repeat LFTs this AM but doubt abdominal source of AMS at this time  AMS - workup unrevealing so far, discussed with TRH, consider neuro consult  FEN: not able to swallow presently VTE: LMWH ID: flagyl  - below per TRH -  HTN HLD Mitral valve prolapse S/p PPM Atrial fibrillation not on anticoagulation Hx of cerebral AVM  LOS: 2 days    Norm Parcel, [facility] 04/26/2022, 9:03 AM Please see Amion for pager number during day hours 7:00am-4:30pm

## 2022-04-27 DIAGNOSIS — R4701 Aphasia: Secondary | ICD-10-CM

## 2022-04-27 LAB — PHENOBARBITAL LEVEL: Phenobarbital: 17.5 ug/mL (ref 15.0–40.0)

## 2022-04-27 LAB — PHENYTOIN LEVEL, TOTAL: Phenytoin Lvl: 5.9 ug/mL — ABNORMAL LOW (ref 10.0–20.0)

## 2022-04-27 MED ORDER — SODIUM CHLORIDE 0.9 % IV SOLN
300.0000 mg | Freq: Once | INTRAVENOUS | Status: AC
  Administered 2022-04-27: 300 mg via INTRAVENOUS
  Filled 2022-04-27: qty 6

## 2022-04-27 NOTE — Progress Notes (Signed)
Speech Language Pathology Treatment: Dysphagia  Patient Details Name: [name] MRN: [id number] DOB: Sep 04, 1935 Today's Date: 04/27/2022 Time: 6384-6659 SLP Time Calculation (min) (ACUTE ONLY): 14 min  Assessment / Plan / Recommendation Clinical Impression  Pt today demonstrates more language output - continues with impaired attention to tasks resulting in her needing total cues to accept po intake. Minimal po accepted by pt due to her distractibility - but she was able to consume 2 boluses of cranberry juice, single bolus of water and 2 bites of ice cream with max verbal/visual/tactile cues to attend to task.  Did not attempt to masticate, bite off graham cracker bolus -thus at this time, do not recommend solids.  Swallow clinically judged to be delayed but no indication of aspiration.  Eructation noted x1 after liquid swallow.  Recommend advance to full liquid diet with strict precautions. Daughter present and educated to recommendations.           HPI HPI: Per PT note "86yo female presenting s/p laparoscopic cholecystectomy on 04/23/22. Pt resides at [facility]. Presenting with post-anesthesia change in cognitive status. PMH: afib with pacemaker, HTN, epilepsy, osteopenia. Brain MRI showed "AVM left frontal lobe measuring 4.7 x 5.1 cm. There is a  large draining superficial cortical vein. The left anterior cerebral  artery is enlarged and appears to be the primary supply to the AVM.  No evidence of prior hemorrhage.  2. 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  Daughter, [name], present and reports pt had no premorbid difficulties with memory, language or swallowing.  Pt was not following directions with PT on 04/25/2022. Pt was transferred to [facility] for constant EEG monitoring - SLP follow up for dysphagia and language/cog eval.      SLP Plan  Continue with current plan of care  Patient  needs continued Speech Lanaguage Pathology Services   Recommendations for follow up therapy are one component of a multi-disciplinary discharge planning process, led by the attending physician.  Recommendations may be updated based on patient status, additional functional criteria and insurance authorization.    Recommendations  Diet recommendations: Thin liquid (full liquid) Medication Administration: Crushed with puree Compensations: Small sips/bites;Slow rate;Other (Comment) (have pt assist to self feed - hold her own cup) Postural Changes and/or Swallow Maneuvers: Seated upright 90 degrees;Upright 30-60 min after meal (check for oral retention)                Oral Care Recommendations: Oral care BID Follow Up Recommendations: Follow physician's recommendations for discharge plan and follow up therapies SLP Visit Diagnosis: Dysphagia, oral phase (R13.11) Plan: Continue with current plan of care         Kelsey Lime, MS [place] Office [phone] Pager [phone]   [name]  04/27/2022, 9:10 AM

## 2022-04-27 NOTE — TOC Progression Note (Signed)
Transition of Care [facility]) - Progression Note    Patient Details  Name: [name] MRN: [id number] Date of Birth: [birth year]  Transition of Care [facility] CM/SW [place], [place] Phone Number: 04/27/2022, 11:35 AM  Clinical Narrative:     Pt from ALF [facility], will return at DC. [facility] will need to be updated at DC . Pt will need PTAR transport. TOC will continue to follow for DC needs.  Expected Discharge Plan: Assisted Living Barriers to Discharge: Barriers Resolved  Expected Discharge Plan and Services Expected Discharge Plan: Assisted Living In-house Referral: NA Discharge Planning Services: NA Post Acute Care Choice: Resumption of Svcs/PTA Provider Living arrangements for the past 2 months: Assisted Living Facility                 DME Arranged: N/A DME Agency: NA                   Social Determinants of Health (SDOH) Interventions    Readmission Risk Interventions     No data to display

## 2022-04-27 NOTE — Progress Notes (Signed)
LTM EEG hooked up and running - no initial skin breakdown - push button tested - [facility] monitoring.  

## 2022-04-27 NOTE — Care Management Important Message (Signed)
Important Message  Patient Details  Name: [name] MRN: [id number] Date of Birth: Sep 11, 1935   Medicare Important Message Given:  Yes     Sayeed Weatherall 04/27/2022, 1:56 PM

## 2022-04-27 NOTE — Progress Notes (Signed)
PROGRESS NOTE    [name]  VVO:[id number] DOB: 1935-12-10 DOA: 04/22/2022 PCP: Virgie Dad, MD    Brief Narrative:  86 year old from assisted living facility with history of permanent A-fib status post pacemaker, hypertension, complex partial epilepsy since childhood presented with 1 days of right upper quadrant abdominal pain and she was found to have calculus cholecystitis.  She was admitted to the hospital with IV fluids, antibiotics.  Underwent laparoscopic cholecystectomy with grossly infected gallbladder.  Good clinical recovery.  11/15.  After appropriate recovery from surgery, she started developing word finding difficulties and confusion.  Symptoms significantly worse overnight.  No focal deficits.  Persistently encephalopathic.  Assessment & Plan:   Acute metabolic encephalopathy, confusion and word finding difficulty, not present on admission.   Rule out breakthrough vs provoked seizure - Neurology following - appreciate insight/recs - EEG continue to be abnormal but no overt seizures noted on tracing - Likely decreased seizure threshold due to surgical stress, missed medications and subtherapeutic levels with very large left frontal lobe mass. - Labs unremarkable, B12 WNL, TSH 4.3, UA WNL, Ammonia WNL - Imaging (CT/MRI) show stable AV malformation in L frontal lobe 5x5 cm - Continue Dilantin, bolus dosing per Neuro until levels therapeutic; Keppra ongoing until Dilantin therapeutic - Seizure precautions ongoing  Cholecystitis with cholelithiasis: Lap chole 26/94, without complications, continue to advance diet as tolerated - currently full liquid diet. Gen Sx signing off - follow up outpatient as scheduled. Daughter aware.   Hypomagnesemia: Severe and persistent. 1.1 at intake - repleted. Follow repeat labs.  Essential hypertension Hyperlipidemia  - Continue home medications, no changes   DVT prophylaxis: enoxaparin (LOVENOX) injection 40 mg Start: 04/24/22  1000   Code Status: DNR Family Communication: Daughter updated at bedside 11/16 Disposition Plan: Status is: Inpatient Remains inpatient appropriate because: Altered mental status.  Consultants:  General surgery(signed off), Neurology  Procedures:  Lap chole 11/13  Antimicrobials:  Cefepime and Flagyl 11/12---11/15   Subjective: No acute issue/events overnight. Patient much more awake and alert than previously documented. Remains confused and unclear if she is able to answer questions appropriately. Kept repeating "not ready to get ready" on numerous occasions despite what question was asked.  Objective: Vitals:   04/26/22 2215 04/26/22 2324 04/27/22 0304 04/27/22 0740  BP: (!) 176/49 (!) 160/47 (!) 131/47 (!) 171/48  Pulse: 64 (!) 59 89 (!) 59  Resp: '20 12 16 17  '$ Temp: 98.1 F (36.7 C) (!) 97.5 F (36.4 C) 98 F (36.7 C)   TempSrc: Axillary Oral Axillary   SpO2: 100% 100% 100% 100%  Weight: 62.6 kg     Height:        Intake/Output Summary (Last 24 hours) at 04/27/2022 0751 Last data filed at 04/27/2022 0600 Gross per 24 hour  Intake 378 ml  Output 1000 ml  Net -622 ml    Filed Weights   04/22/22 0406 04/26/22 2215  Weight: 62.1 kg 62.6 kg    Examination:  General exam: Awake and alert - calm and pleasant, unable to follow commands Patient unable to reliably follow any commands.  She has mild asymmetrical face, unknown whether this is chronic. Does not follow commands appropriately but Strength is equal in all extremities. Unable to express, however her words are fluent and clear. Respiratory system: Clear to auscultation. Respiratory effort normal. Cardiovascular system: S1 & S2 heard, RRR.  Pacemaker in place.   Gastrointestinal system: Post op site clean and dry.  Nontender on exam. Soft, nondistended.  Extremities: Symmetric 5 x 5 power.  Moves all extremities. Skin: No rashes, lesions or ulcers Psychiatry: Judgement and insight limited given ongoing  confusion  Data Reviewed: I have personally reviewed following labs and imaging studies  CBC: Recent Labs  Lab 04/22/22 0434 04/23/22 0811 04/24/22 0504 04/25/22 0735 May 18, 2022 0901  WBC 13.3* 15.4* 11.3* 8.9 5.7  NEUTROABS 11.9*  --  10.0* 7.6  --   HGB 10.2* 9.6* 9.1* 9.5* 10.2*  HCT 31.7* 29.3* 28.1* 29.3* 32.4*  MCV 104.3* 103.2* 104.5* 103.5* 106.2*  PLT 288 249 237 274 737    Basic Metabolic Panel: Recent Labs  Lab 04/22/22 0434 04/22/22 1826 04/23/22 0811 04/24/22 0504 04/25/22 0735 May 18, 2022 0901  NA 141  --  136 136 135 136  K 3.7  --  4.2 3.7 3.7 3.8  CL 105  --  101 103 103 105  CO2 27  --  25 22 19* 21*  GLUCOSE 140*  --  116* 91 89 98  BUN 25*  --  25* 29* 29* 27*  CREATININE 1.05*  --  1.25* 1.14* 1.32* 1.24*  CALCIUM 8.2* 8.1* 8.0* 8.1* 8.4* 8.6*  MG  --  1.1*  --  1.8 1.9  --   PHOS  --   --   --  4.4 3.5  --     GFR: Estimated Creatinine Clearance: 31.6 mL/min (A) (by C-G formula based on SCr of 1.24 mg/dL (H)). Liver Function Tests: Recent Labs  Lab 04/22/22 0434 04/23/22 0811 04/24/22 0504 04/25/22 0735 2022-05-18 0901  AST '29 25 24 21 18  '$ ALT '18 16 16 15 12  '$ ALKPHOS 68 58 52 53 54  BILITOT 0.6 0.9 0.7 1.1 0.9  PROT 7.0 6.3* 5.8* 5.9* 5.9*  ALBUMIN 3.7 3.2* 2.8* 2.8* 2.6*    Recent Labs  Lab 04/22/22 0434  LIPASE 42    Recent Labs  Lab May 18, 2022 0901  AMMONIA 18    Coagulation Profile: No results for input(s): "INR", "PROTIME" in the last 168 hours. Cardiac Enzymes: No results for input(s): "CKTOTAL", "CKMB", "CKMBINDEX", "TROPONINI" in the last 168 hours. BNP (last 3 results) No results for input(s): "PROBNP" in the last 8760 hours. HbA1C: No results for input(s): "HGBA1C" in the last 72 hours.  CBG: Recent Labs  Lab 04/23/22 2037  GLUCAP 122*    Lipid Profile: No results for input(s): "CHOL", "HDL", "LDLCALC", "TRIG", "CHOLHDL", "LDLDIRECT" in the last 72 hours. Thyroid Function Tests: Recent Labs     04/25/22 0735  TSH 4.325    Anemia Panel: Recent Labs    04/25/22 0735  VITAMINB12 663    Sepsis Labs: No results for input(s): "PROCALCITON", "LATICACIDVEN" in the last 168 hours.  No results found for this or any previous visit (from the past 240 hour(s)).       Radiology Studies: EEG adult  Result Date: 05/18/2022 Lora Havens, MD     2022/05/18 11:57 AM Patient Name: [name] MRN: [id number] Epilepsy Attending: Lora Havens Referring Physician/Provider: Barb Merino, MD Date:05-18-2022 Duration: 23.22 mins Patient history: 86yo F with ams. EEG to evaluate for seizure Level of alertness: Awake AEDs during EEG study: LEV Technical aspects: This EEG study was done with scalp electrodes positioned according to the 10-20 International system of electrode placement. Electrical activity was reviewed with band pass filter of 1-'70Hz'$ , sensitivity of 7 uV/mm, display speed of 36m/sec with a '60Hz'$  notched filter applied as appropriate. EEG data were recorded continuously and digitally stored.  Video monitoring was available and reviewed as appropriate. Description: EEG showed continuous generalized 3 to 6 Hz theta-delta slowing. Hyperventilation and photic stimulation were not performed.   Of note, study was technically difficult due to significant movement and myogenic artifact. ABNORMALITY - Continuous slow, generalized IMPRESSION: This technically difficult study is suggestive of moderate diffuse encephalopathy, nonspecific etiology.  No seizures or epileptiform discharges were seen throughout the recording. Lora Havens   MR BRAIN WO CONTRAST  Result Date: 04/25/2022 CLINICAL DATA:  Mental status change.  Confusion. EXAM: MRI HEAD WITHOUT CONTRAST TECHNIQUE: Multiplanar, multiecho pulse sequences of the brain and surrounding structures were obtained without intravenous contrast. COMPARISON:  CT head 04/25/2022 FINDINGS: Brain: Large lesion left frontal lobe measuring  approximately 4.7 x 5.1 cm. This is calcified on CT with multiple flow voids and dilated vessels on MRI. There is a large draining superficial cortical vein. The left anterior cerebral artery is enlarged and appears to be the primary supply to the presumed AVM. Enlarged veins also present within the left lateral ventricle. No evidence of prior hemorrhage. Negative for hydrocephalus. Generalized atrophy. Negative for acute infarct. Vascular: Flow voids are present in the circle-of-Willis. 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 There is also a possible aneurysm of the anterior communicating artery. Large AVM left frontal lobe as described above. Skull and upper cervical spine: No focal skeletal abnormality. Sinuses/Orbits: Mucosal edema left maxillary sinus. Manning sinuses clear. Bilateral cataract extraction Other: None IMPRESSION: 1. Large AVM left frontal lobe measuring 4.7 x 5.1 cm. There is a large draining superficial cortical vein. The left anterior cerebral artery is enlarged and appears to be the primary supply to the AVM. No evidence of prior hemorrhage. 2. 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 3. Atrophy. No acute infarct. Electronically Signed   By: Franchot Gallo M.D.   On: 04/25/2022 14:32   CT HEAD WO CONTRAST (5MM)  Result Date: 04/25/2022 CLINICAL DATA:  Mental status change. EXAM: CT HEAD WITHOUT CONTRAST TECHNIQUE: Contiguous axial images were obtained from the base of the skull through the vertex without intravenous contrast. RADIATION DOSE REDUCTION: This exam was performed according to the departmental dose-optimization program which includes automated exposure control, adjustment of the mA and/or kV according to patient size and/or use of iterative reconstruction technique. COMPARISON:  Head CT 10/15/2021 FINDINGS: Brain: Stable large partially calcified left frontal lobe process likely  chronic AVM. No surrounding edema to suggest infarction or inflammation. The ventricles are stable in size and configuration and in the midline without mass effect or shift. No extra-axial fluid collections are identified. No CT findings for acute hemispheric infarction or intracranial hemorrhage. No mass lesions. The brainstem and cerebellum are grossly normal. Vascular: Stable vascular calcifications.  No hyperdense vessels. Skull: No skull fracture or bone lesions. Sinuses/Orbits: The paranasal sinuses and mastoid air cells are clear except for fluid in the left maxillary sinus. The globes are intact. Other: Extensive calcification noted around the annular ligament at C1-2 and there is extensive calcification involving the temporomandibular joints. Findings suggest CPPD arthropathy. IMPRESSION: 1. Stable large partially calcified left frontal lobe process likely chronic AVM. 2. No acute intracranial findings. 3. Fluid in the left maxillary sinus. 4. Chronic changes of CPPD arthropathy. Electronically Signed   By: Marijo Sanes M.D.   On: 04/25/2022 11:46        Scheduled Meds:  Chlorhexidine Gluconate Cloth  6 each Topical Daily  enoxaparin (LOVENOX) injection  40 mg Subcutaneous Q24H   hydrALAZINE  25 mg Oral BID   irbesartan  150 mg Oral Daily   magnesium oxide  400 mg Oral BID   PHENObarbital  65 mg Intravenous Daily   phenytoin (DILANTIN) IV  100 mg Intravenous Q12H   Continuous Infusions:  acetaminophen 1,000 mg (04/27/22 0026)   levETIRAcetam 500 mg (04/26/22 2243)     LOS: 3 days    Time spent: 35 minutes    Little Ishikawa, DO [facility] Pager [phone]

## 2022-04-27 NOTE — Plan of Care (Signed)

## 2022-04-27 NOTE — Procedures (Signed)
Patient Name: [name]  MRN: [id number]  Epilepsy Attending: Lora Havens  Referring Physician/Provider: Lorenza Chick, MD  Duration: 04/26/2022 2330  to 11/17/20231200   Patient history: 86yo F with ams. EEG to evaluate for seizure   Level of alertness: Awake, asleep   AEDs during EEG study: LEV   Technical aspects: This EEG study was done with scalp electrodes positioned according to the 10-20 International system of electrode placement. Electrical activity was reviewed with band pass filter of 1-'70Hz'$ , sensitivity of 7 uV/mm, display speed of 38m/sec with a '60Hz'$  notched filter applied as appropriate. EEG data were recorded continuously and digitally stored.  Video monitoring was available and reviewed as appropriate.   Description: No clear posterior dominant rhythm was seen. Sleep was characterized by sleep spindles (12-'14hz'$ ), maximal fronto-central region. EEG showed continuous generalized 3 to 6 Hz theta-delta slowing. Generalized periodic discharges with triphasic morphology were noted at '1hz'$ , more prominent when awake/stimulated. Hyperventilation and photic stimulation were not performed.     ABNORMALITY - Periodic discharges with triphasic morphology, generalized  - Continuous slow, generalized   IMPRESSION: This study showed generalized periodic discharges with triphasic morphology which can be on the ictal-interictal continuum. However, the morphology, frequency and reactivity to stimulation is more commonly indicative of toxic-metabolic causes. Additionally there is moderate diffuse encephalopathy, nonspecific etiology. No seizures were seen throughout the recording.   [name] OBarbra Sarks

## 2022-04-27 NOTE — Progress Notes (Signed)
Progress Note  4 Days Post-Op  Subjective: Clearly today but still with some repetitiveness and perseveration.  Objective: Vital signs in last 24 hours: Temp:  [97.5 F (36.4 C)-98.4 F (36.9 C)] 98.4 F (36.9 C) (11/17 1137) Pulse Rate:  [59-89] 59 (11/17 1137) Resp:  [12-20] 17 (11/17 1137) BP: (131-177)/(43-61) 139/43 (11/17 1137) SpO2:  [99 %-100 %] 100 % (11/17 1137) Weight:  [62.6 kg] 62.6 kg (11/16 2215) Last BM Date : 04/25/22  Intake/Output from previous day: 11/16 0701 - 11/17 0700 In: 378 [P.O.:178; IV Piggyback:200] Out: 1000 [Urine:1000] Intake/Output this shift: No intake/output data recorded.  PE: General: pleasant, WD, elderly female who is lying in bed  Abd: soft, NT, ND, incisions C/D/I    Lab Results:  Recent Labs    04/25/22 0735 04/26/22 0901  WBC 8.9 5.7  HGB 9.5* 10.2*  HCT 29.3* 32.4*  PLT 274 283   BMET Recent Labs    04/25/22 0735 04/26/22 0901  NA 135 136  K 3.7 3.8  CL 103 105  CO2 19* 21*  GLUCOSE 89 98  BUN 29* 27*  CREATININE 1.32* 1.24*  CALCIUM 8.4* 8.6*   PT/INR No results for input(s): "LABPROT", "INR" in the last 72 hours. CMP     Component Value Date/Time   NA 136 04/26/2022 0901   NA 137 04/03/2022 0000   K 3.8 04/26/2022 0901   CL 105 04/26/2022 0901   CO2 21 (L) 04/26/2022 0901   GLUCOSE 98 04/26/2022 0901   BUN 27 (H) 04/26/2022 0901   BUN 19 04/03/2022 0000   CREATININE 1.24 (H) 04/26/2022 0901   CREATININE 1.26 (H) 06/21/2021 1430   CALCIUM 8.6 (L) 04/26/2022 0901   CALCIUM 8.1 (L) 04/22/2022 1826   PROT 5.9 (L) 04/26/2022 0901   ALBUMIN 2.6 (L) 04/26/2022 0901   AST 18 04/26/2022 0901   ALT 12 04/26/2022 0901   ALKPHOS 54 04/26/2022 0901   BILITOT 0.9 04/26/2022 0901   GFRNONAA 43 (L) 04/26/2022 0901   GFRAA 53.34 04/25/2021 0000   Lipase     Component Value Date/Time   LIPASE 42 04/22/2022 0434       Studies/Results: Overnight EEG with video  Result Date: 04/27/2022 Lora Havens, MD     04/27/2022 12:10 PM Patient Name: [name] MRN: [id number] Epilepsy Attending: Lora Havens Referring Physician/Provider: Lorenza Chick, MD Duration: 04/26/2022 2330  to 11/17/20231200  Patient history: 86yo F with ams. EEG to evaluate for seizure  Level of alertness: Awake, asleep  AEDs during EEG study: LEV  Technical aspects: This EEG study was done with scalp electrodes positioned according to the 10-20 International system of electrode placement. Electrical activity was reviewed with band pass filter of 1-'70Hz'$ , sensitivity of 7 uV/mm, display speed of 64m/sec with a '60Hz'$  notched filter applied as appropriate. EEG data were recorded continuously and digitally stored.  Video monitoring was available and reviewed as appropriate.  Description: No clear posterior dominant rhythm was seen. Sleep was characterized by sleep spindles (12-'14hz'$ ), maximal fronto-central region. EEG showed continuous generalized 3 to 6 Hz theta-delta slowing. Generalized periodic discharges with triphasic morphology were noted at '1hz'$ , more prominent when awake/stimulated. Hyperventilation and photic stimulation were not performed.   ABNORMALITY - Periodic discharges with triphasic morphology, generalized - Continuous slow, generalized  IMPRESSION: This study showed generalized periodic discharges with triphasic morphology which can be on the ictal-interictal continuum. However, the morphology, frequency and reactivity to stimulation is more commonly indicative  of toxic-metabolic causes. Additionally there is moderate diffuse encephalopathy, nonspecific etiology. No seizures were seen throughout the recording.  Lora Havens   EEG adult  Result Date: 04/26/2022 Lora Havens, MD     04/26/2022 11:57 AM Patient Name: [name] MRN: [id number] Epilepsy Attending: Lora Havens Referring Physician/Provider: Barb Merino, MD Date:04/26/2022 Duration: 23.22 mins Patient history: 86yo F  with ams. EEG to evaluate for seizure Level of alertness: Awake AEDs during EEG study: LEV Technical aspects: This EEG study was done with scalp electrodes positioned according to the 10-20 International system of electrode placement. Electrical activity was reviewed with band pass filter of 1-'70Hz'$ , sensitivity of 7 uV/mm, display speed of 34m/sec with a '60Hz'$  notched filter applied as appropriate. EEG data were recorded continuously and digitally stored.  Video monitoring was available and reviewed as appropriate. Description: EEG showed continuous generalized 3 to 6 Hz theta-delta slowing. Hyperventilation and photic stimulation were not performed.   Of note, study was technically difficult due to significant movement and myogenic artifact. ABNORMALITY - Continuous slow, generalized IMPRESSION: This technically difficult study is suggestive of moderate diffuse encephalopathy, nonspecific etiology.  No seizures or epileptiform discharges were seen throughout the recording. PLora Havens  MR BRAIN WO CONTRAST  Result Date: 04/25/2022 CLINICAL DATA:  Mental status change.  Confusion. EXAM: MRI HEAD WITHOUT CONTRAST TECHNIQUE: Multiplanar, multiecho pulse sequences of the brain and surrounding structures were obtained without intravenous contrast. COMPARISON:  CT head 04/25/2022 FINDINGS: Brain: Large lesion left frontal lobe measuring approximately 4.7 x 5.1 cm. This is calcified on CT with multiple flow voids and dilated vessels on MRI. There is a large draining superficial cortical vein. The left anterior cerebral artery is enlarged and appears to be the primary supply to the presumed AVM. Enlarged veins also present within the left lateral ventricle. No evidence of prior hemorrhage. Negative for hydrocephalus. Generalized atrophy. Negative for acute infarct. Vascular: Flow voids are present in the circle-of-Willis. 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 There  is also a possible aneurysm of the anterior communicating artery. Large AVM left frontal lobe as described above. Skull and upper cervical spine: No focal skeletal abnormality. Sinuses/Orbits: Mucosal edema left maxillary sinus. Manning sinuses clear. Bilateral cataract extraction Other: None IMPRESSION: 1. Large AVM left frontal lobe measuring 4.7 x 5.1 cm. There is a large draining superficial cortical vein. The left anterior cerebral artery is enlarged and appears to be the primary supply to the AVM. No evidence of prior hemorrhage. 2. 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 3. Atrophy. No acute infarct. Electronically Signed   By: CFranchot GalloM.D.   On: 04/25/2022 14:32    Anti-infectives: Anti-infectives (From admission, onward)    Start     Dose/Rate Route Frequency Ordered Stop   04/26/22 0300  metroNIDAZOLE (FLAGYL) IVPB 500 mg  Status:  Discontinued        500 mg 100 mL/hr over 60 Minutes Intravenous Every 12 hours 04/25/22 1556 04/26/22 1242   04/24/22 0000  cefdinir (OMNICEF) 300 MG capsule        300 mg Oral 2 times daily 04/24/22 1031 04/29/22 2359   04/24/22 0000  metroNIDAZOLE (FLAGYL) 500 MG tablet        500 mg Oral 2 times daily 04/24/22 1031 04/29/22 2359   04/22/22 2200  metroNIDAZOLE (FLAGYL) IVPB 500 mg  Status:  Discontinued  500 mg 100 mL/hr over 60 Minutes Intravenous Every 12 hours 04/22/22 1732 04/25/22 1556   04/22/22 2200  ceFEPIme (MAXIPIME) 2 g in sodium chloride 0.9 % 100 mL IVPB  Status:  Discontinued        2 g 200 mL/hr over 30 Minutes Intravenous Every 12 hours 04/22/22 0946 04/25/22 0728   04/22/22 0830  ceFEPIme (MAXIPIME) 2 g in sodium chloride 0.9 % 100 mL IVPB       See Hyperspace for full Linked Orders Report.   2 g 200 mL/hr over 30 Minutes Intravenous  Once 04/22/22 0825 04/22/22 0917   04/22/22 0830  metroNIDAZOLE (FLAGYL) IVPB 500 mg       See Hyperspace for full Linked Orders  Report.   500 mg 100 mL/hr over 60 Minutes Intravenous  Once 04/22/22 0825 04/22/22 1025        Assessment/Plan  Acute gangrenous cholecystitis  POD S/P laparoscopic cholecystectomy - abdominal exam benign - LFTs today -tolerating FLD that she has passed a swallow for.  Diet from our standpoint can be advanced as she is able to pass for according to speech -surgically she is stable -follow up in place.  Discussed DC instructions with daughter at bedside. -we will sign off at this time, but please let us know if we can be of further assistance. -d/w neuro and primary service  AMS - per neurology, breakthrough seizure in setting of missed dose of antiepileptics and use of abx therapy.  FEN: FLD VTE: LMWH ID: no further  - below per TRH -  HTN HLD Mitral valve prolapse S/p PPM Atrial fibrillation not on anticoagulation Hx of cerebral AVM  LOS: 3 days    Henreitta Cea, [facility] Surgery 04/27/2022, 12:20 PM Please see Amion for pager number during day hours 7:00am-4:30pm

## 2022-04-27 NOTE — Evaluation (Signed)
Speech Language Pathology Evaluation Patient Details Name: [name] MRN: [id number] DOB: 1936-02-01 Today's Date: 04/27/2022 Time: 2951-8841 SLP Time Calculation (min) (ACUTE ONLY): 21 min  Problem List:  Patient Active Problem List   Diagnosis Date Noted   Cholecystitis with cholelithiasis 04/24/2022   Acute cholecystitis 04/22/2022   Hyperglycemia 04/22/2022   Hypocalcemia 04/22/2022   Neuropathy 04/05/2022   Pericardial effusion without cardiac tamponade 01/28/2022   Acute pericarditis    Pacemaker    Heart block AV complete (HCC)    Paroxysmal atrial fibrillation ([place])    Aortic atherosclerosis ([place])    Chest pain 01/27/2022   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   Syncope 10/15/2021   Hypertension    Cerebral AV malformation    Dyslipidemia    Acute kidney injury superimposed on chronic kidney disease (HCC)    Elevated LFTs 05/24/2021   Iron deficiency anemia 05/24/2021   Macrocytic anemia 05/17/2021   Hypokalemia 04/11/2021   Pressure injury of skin 07/22/2020   Hyponatremia 07/21/2020   Mixed hyperlipidemia 66/11/3014   Acute metabolic encephalopathy 06/19/3233   History of complex partial epilepsy 07/21/2020   Hypomagnesemia 07/21/2020   Hypokalemia due to excessive gastrointestinal loss of potassium 07/21/2020   Diarrhea 07/21/2020   Partial symptomatic epilepsy with complex partial seizures, not intractable, without status epilepticus ([place]) 07/28/2014   Spinal stenosis of lumbar region 07/28/2014   Abnormality of gait 11/24/2013   Low back pain 11/24/2013   Essential hypertension    Hypercholesterolemia    Carotid arterial disease (HCC)    MVP (mitral valve prolapse)    Abnormal cardiovascular stress test    Past Medical History:  Past Medical History:  Diagnosis Date   Abnormal cardiovascular stress test    Carotid arterial disease ([place])    MODERATE RIGHT   Cerebral AV malformation    DDD (degenerative disc disease)    SEVERE WITH SPINAL STENOSIS    Hypercholesterolemia    Hypertension    MVP (mitral valve prolapse)    MILD   Numbness    LEFT FOOT AND ANKLE   Osteopenia    Presence of permanent cardiac pacemaker    Pseudo-gout    Past Surgical History:  Past Surgical History:  Procedure Laterality Date   APPENDECTOMY     BREAST MASS EXCISION     CARPAL TUNNEL RELEASE     CHOLECYSTECTOMY N/A 04/23/2022   Procedure: LAPAROSCOPIC CHOLECYSTECTOMY;  Surgeon: Coralie Keens, MD;  Location: [facility];  Service: General;  Laterality: N/A;   PACEMAKER IMPLANT N/A 11/07/2021   Procedure: PACEMAKER IMPLANT;  Surgeon: Evans Lance, MD;  Location: [place] [facility];  Service: Cardiovascular;  Laterality: N/A;   REMOVAL OF PARATHYROID GLAND     STRESS NUCLEAR STUDY     ABNORMAL   TONSILLECTOMY     HPI:  Per PT note "86yo female presenting s/p laparoscopic cholecystectomy on 04/23/22. Pt resides at [facility]. Presenting with post-anesthesia change in cognitive status. PMH: afib with pacemaker, HTN, epilepsy, osteopenia. Brain MRI showed "AVM left frontal lobe measuring 4.7 x 5.1 cm. There is a  large draining superficial cortical vein. The left anterior cerebral  artery is enlarged and appears to be the primary supply to the AVM.  No evidence of prior hemorrhage.  2. 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  Daughter, [name], present and reports pt had no premorbid difficulties with memory, language or swallowing.  Pt  was not following directions with PT on 04/25/2022. Pt was transferred to [facility] for constant EEG monitoring - SLP follow up for dysphagia and language/cog eval.   Assessment / Plan / Recommendation Clinical Impression  Patient currently is presenting with severe cognitive linguistic deficits c/b very poor attention, perseveration and language deficits.  She does not follow one step directions but does have islands of fluent  speech.  Semantic paraphasia noted with communication suspect exacerbated by severe perseveration.  Eg: "I like cranberry, she doesn't now she likes it"  She was able to state her name and her children's names - 2 names independently - 1 with signficant delay.  Appropriately demonstrated sadness when talking of her daughter who passed approx 10 years ago *Repeatedly stating "I don't want to hear [name]" - Daughter present and assisted pt to relax.  Pt is not able to consistently state yes/no for basic needs rapidly - but with time, she answered appropriately.  Pt did not identify her name from written choice of 2. Pt became fatigued during session and more tearful stating "I don't like this", "Why di Hopeful for improvement in patient's communication as hospital course continues.   At this time, basic yes/no for immediate needs advised.Spoke to RN with recommendations.    SLP Assessment  SLP Recommendation/Assessment: Patient needs continued Speech Lanaguage Pathology Services    Recommendations for follow up therapy are one component of a multi-disciplinary discharge planning process, led by the attending physician.  Recommendations may be updated based on patient status, additional functional criteria and insurance authorization.    Follow Up Recommendations  Follow physician's recommendations for discharge plan and follow up therapies    Assistance Recommended at Discharge     Functional Status Assessment Patient has had a recent decline in their functional status and demonstrates the ability to make significant improvements in function in a reasonable and predictable amount of time.  Frequency and Duration min 1 x/week  1 week      SLP Evaluation Cognition  Overall Cognitive Status: Impaired/Different from baseline Arousal/Alertness: Awake/alert Orientation Level: Oriented to person;Disoriented to place;Disoriented to time;Disoriented to situation Attention: Focused;Sustained Focused  Attention: Impaired Focused Attention Impairment: Functional basic Sustained Attention: Impaired Sustained Attention Impairment: Functional basic Memory: Impaired (appeared surprised that her daughter was here - after she had been here already) Awareness: Impaired Problem Solving: Impaired Behaviors: Restless;Perseveration;Lability Safety/Judgment: Impaired       Comprehension  Auditory Comprehension Overall Auditory Comprehension: Impaired Yes/No Questions: Not tested Commands: Impaired One Step Basic Commands: 0-24% accurate Conversation: Simple Visual Recognition/Discrimination Discrimination: Not tested Reading Comprehension Reading Status: Impaired Word level: Impaired (pt did not read her name from choice of two) Sentence Level: Not tested Paragraph Level: Not tested Functional Environmental (signs, name badge): Not tested    Expression Expression Primary Mode of Expression: Verbal Verbal Expression Overall Verbal Expression: Impaired Initiation: No impairment Level of Generative/Spontaneous Verbalization: Phrase Repetition: Impaired Level of Impairment: Word level Naming: Impairment (did not name single object) Confrontation: Impaired Verbal Errors: Semantic paraphasias;Phonemic paraphasias;Perseveration;Language of confusion;Not aware of errors Interfering Components: Attention Written Expression Dominant Hand: Right   Oral / Motor  Oral Motor/Sensory Function Overall Oral Motor/Sensory Function:  (right facial asymmetry continues - daughter questions if it more pronounced compared to 2 days ago) Motor Speech Overall Motor Speech: Appears within functional limits for tasks assessed Respiration: Within functional limits Resonance: Within functional limits Articulation: Within functional limitis Intelligibility: Intelligible Motor Planning: Not tested  Macario Golds 04/27/2022, 9:01 AM Kathleen Lime, MS [facility] SLP Acute Rehab Services Office  [phone] Pager [phone]

## 2022-04-27 NOTE — Progress Notes (Signed)
Neurology Progress Note   S:// Patient now at [facility]. On long-term EEG Daughter at bedside No acute overnight complaints Able to say some long sentences now.   O:// Current vital signs: BP (!) 171/48 (BP Location: Left Arm)   Pulse (!) 59   Temp 98.2 F (36.8 C) (Axillary)   Resp 17   Ht '5\' 6"'$  (1.676 m)   Wt 62.6 kg   SpO2 100%   BMI 22.28 kg/m  Vital signs in last 24 hours: Temp:  [97.5 F (36.4 C)-98.2 F (36.8 C)] 98.2 F (36.8 C) (11/17 0740) Pulse Rate:  [59-89] 59 (11/17 0740) Resp:  [12-20] 17 (11/17 0740) BP: (131-177)/(47-61) 171/48 (11/17 0740) SpO2:  [99 %-100 %] 100 % (11/17 0740) Weight:  [62.6 kg] 62.6 kg (11/16 2215) General: Awake alert in no distress HEENT: Normocephalic and atraumatic Lungs: Clear Abdomen: Mildly tender at the surgical site but nondistended Extremities warm well perfused Neurological exam Awake alert  Asked me who I am and who I work with. Also asked me if I work with Dr. Lyndel Safe and then perseverated on the same question until she came to another sentence and then perseverated on that There is no dysarthria She has poor attention concentration She is able to name simple objects such as a pen but starts perseverating and not able to name rather uncommon objects consistently. Does not repeat on command Follows simple commands intermittently Cranial nerves II 12 intact Motor exam with mild right upper extremity weakness.  Left arm appears strong.  Both lower extremities are antigravity but does not hold them against gravity for 10 seconds as she does not follow commands consistently. Coordination is difficult to assess    Medications  Current Facility-Administered Medications:    acetaminophen (OFIRMEV) IV 1,000 mg, 1,000 mg, Intravenous, Q6H, Ghimire, Kuber, MD, Last Rate: 400 mL/hr at 04/27/22 0026, 1,000 mg at 04/27/22 0026   acetaminophen (TYLENOL) 160 MG/5ML solution 1,000 mg, 1,000 mg, Oral, Q6H PRN, Barb Merino, MD   Chlorhexidine Gluconate Cloth 2 % PADS 6 each, 6 each, Topical, Daily, Ghimire, Dante Gang, MD, 6 each at 04/26/22 1805   diphenhydrAMINE (BENADRYL) injection 12.5 mg, 12.5 mg, Intravenous, Q6H PRN, Meuth, Brooke A, PA-C   enoxaparin (LOVENOX) injection 40 mg, 40 mg, Subcutaneous, Q24H, Meuth, Brooke A, PA-C, 40 mg at 04/27/22 0957   hydrALAZINE (APRESOLINE) injection 10 mg, 10 mg, Intravenous, Q4H PRN, Barb Merino, MD   hydrALAZINE (APRESOLINE) tablet 25 mg, 25 mg, Oral, BID, Meuth, Brooke A, PA-C, 25 mg at 04/27/22 0958   irbesartan (AVAPRO) tablet 150 mg, 150 mg, Oral, Daily, Meuth, Brooke A, PA-C, 150 mg at 04/27/22 0958   levETIRAcetam (KEPPRA) IVPB 500 mg/100 mL premix, 500 mg, Intravenous, Q12H, Ghimire, Kuber, MD, Last Rate: 400 mL/hr at 04/27/22 1009, 500 mg at 04/27/22 1009   magnesium oxide (MAG-OX) tablet 400 mg, 400 mg, Oral, BID, Ghimire, Kuber, MD, 400 mg at 04/27/22 0958   metoprolol tartrate (LOPRESSOR) injection 5 mg, 5 mg, Intravenous, Q6H PRN, Meuth, Brooke A, PA-C, 5 mg at 04/22/22 1830   morphine (PF) 2 MG/ML injection 2 mg, 2 mg, Intravenous, Q3H PRN, Meuth, Brooke A, PA-C, 2 mg at 04/25/22 1312   Oral care mouth rinse, 15 mL, Mouth Rinse, PRN, Barb Merino, MD   oxyCODONE (Oxy IR/ROXICODONE) immediate release tablet 2.5-5 mg, 2.5-5 mg, Oral, Q4H PRN, Meuth, Brooke A, PA-C   PHENObarbital (LUMINAL) injection 65 mg, 65 mg, Intravenous, Daily, Barb Merino, MD   phenytoin (  DILANTIN) injection 100 mg, 100 mg, Intravenous, Q12H, Ghimire, Kuber, MD, 100 mg at 04/27/22 0958   prochlorperazine (COMPAZINE) injection 10 mg, 10 mg, Intravenous, Q6H PRN, Meuth, Brooke A, PA-C, 10 mg at 04/22/22 1830 Labs CBC    Component Value Date/Time   WBC 5.7 04/26/2022 0901   RBC 3.05 (L) 04/26/2022 0901   HGB 10.2 (L) 04/26/2022 0901   HCT 32.4 (L) 04/26/2022 0901   PLT 283 04/26/2022 0901   MCV 106.2 (H) 04/26/2022 0901   MCH 33.4 04/26/2022 0901   MCHC 31.5 04/26/2022 0901    RDW 15.2 04/26/2022 0901   LYMPHSABS 0.4 (L) 04/25/2022 0735   MONOABS 0.8 04/25/2022 0735   EOSABS 0.0 04/25/2022 0735   BASOSABS 0.0 04/25/2022 0735    CMP     Component Value Date/Time   NA 136 04/26/2022 0901   NA 137 04/03/2022 0000   K 3.8 04/26/2022 0901   CL 105 04/26/2022 0901   CO2 21 (L) 04/26/2022 0901   GLUCOSE 98 04/26/2022 0901   BUN 27 (H) 04/26/2022 0901   BUN 19 04/03/2022 0000   CREATININE 1.24 (H) 04/26/2022 0901   CREATININE 1.26 (H) 06/21/2021 1430   CALCIUM 8.6 (L) 04/26/2022 0901   CALCIUM 8.1 (L) 04/22/2022 1826   PROT 5.9 (L) 04/26/2022 0901   ALBUMIN 2.6 (L) 04/26/2022 0901   AST 18 04/26/2022 0901   ALT 12 04/26/2022 0901   ALKPHOS 54 04/26/2022 0901   BILITOT 0.9 04/26/2022 0901   GFRNONAA 43 (L) 04/26/2022 0901   GFRAA 53.34 04/25/2021 0000   Imaging I have reviewed images in epic and the results pertinent to this consultation are: MR brain with large left frontal AVM.  No stroke  Phenobarbital level 17.5 Phenytoin level 5.9 with albumin 2.6 --> corrected level 7.2.  Assessment:  86 year old past medical history of a large left frontal AVM, on phenytoin and phenobarbital for seizures but has had been seizure-free for multiple years maybe decades, admitted for cholecystectomy and a day after surgery had significant difficulty with speech-on exam was globally aphasic. Subtherapeutic on Dilantin-had missed doses.  Phenobarbital was therapeutic. Spot EEG with no evidence of seizures but exam definitely pointing towards left hemispheric dysfunction.  No stroke on MRI. Transferred to [facility] for LTM EEG. Loaded with Keppra and started on Keppra while the Dilantin becomes therapeutic. Exam improving but not at baseline yet-able to say full sentences but perseverates-this is different than yesterday when she barely had a few words that she could say and absolutely did not follow any commands.    Impression: Breakthrough seizure in  the setting of missed doses of antiepileptics and being on antibiotics that might of lowered seizure threshold as well as stressors such as surgery Evaluate for underlying status epilepticus given the presence of a large underlying left frontal AVM that might be a focus for the seizure.  Recommendations: Dilantin also remains subtherapeutic.  We will give an additional 300 mg IV load.  Would like the level to be somewhere between 12-18.  I am cautious in using lower doses for loads because I do not want to overshoot and make her toxic on Dilantin. Continue home dose of phenobarbital Keep antiepileptics intravenous for now Continue Keppra until Dilantin becomes therapeutic We will discontinue Keppra once the Dilantin levels are therapeutic Continue on LTM for another day as we monitor her. Maintain seizure precautions Postsurgical care per medicine and surgery teams We will continue to follow Plan was discussed with surgical  team APP at bedside as well as Dr. Avon Gully.  -- Amie Portland, MD Neurologist [facility] Pager: [phone]

## 2022-04-28 DIAGNOSIS — G40919 Epilepsy, unspecified, intractable, without status epilepticus: Secondary | ICD-10-CM

## 2022-04-28 LAB — PHENYTOIN LEVEL, TOTAL: Phenytoin Lvl: 11.1 ug/mL (ref 10.0–20.0)

## 2022-04-28 NOTE — Procedures (Addendum)
Patient Name: [name]  MRN: [id number]  Epilepsy Attending: Lora Havens  Referring Physician/Provider: Lorenza Chick, MD  Duration: 04/27/2022 2330  to 04/28/2022 1150   Patient history: 86yo F with ams. EEG to evaluate for seizure   Level of alertness: Awake, asleep   AEDs during EEG study: LEV   Technical aspects: This EEG study was done with scalp electrodes positioned according to the 10-20 International system of electrode placement. Electrical activity was reviewed with band pass filter of 1-'70Hz'$ , sensitivity of 7 uV/mm, display speed of 21m/sec with a '60Hz'$  notched filter applied as appropriate. EEG data were recorded continuously and digitally stored.  Video monitoring was available and reviewed as appropriate.   Description: No clear posterior dominant rhythm was seen. Sleep was characterized by sleep spindles (12-'14hz'$ ), maximal fronto-central region. EEG showed continuous generalized 3 to 6 Hz theta-delta slowing. Hyperventilation and photic stimulation were not performed.      ABNORMALITY - Continuous slow, generalized   IMPRESSION: This study is suggestive of moderate diffuse encephalopathy, nonspecific etiology. No seizures were seen throughout the recording.  EEG appears to be improving compared to previous day.   Herminia Warren OBarbra Sarks

## 2022-04-28 NOTE — Progress Notes (Signed)
PROGRESS NOTE    [name]  UXN:[id number] DOB: September 26, 1935 DOA: 04/22/2022 PCP: Kelsey Dad, MD    Brief Narrative:  86 year old from assisted living facility with history of permanent A-fib status post pacemaker, hypertension, complex partial epilepsy since childhood presented with 1 days of right upper quadrant abdominal pain and she was found to have calculus cholecystitis.  She was admitted to the hospital with IV fluids, antibiotics.  Underwent laparoscopic cholecystectomy with grossly infected gallbladder.  Good clinical recovery.  11/15.  After appropriate recovery from surgery, she started developing word finding difficulties and confusion.  Symptoms significantly worse overnight.  No focal deficits.  Persistently encephalopathic.  Assessment & Plan:   Acute metabolic encephalopathy, confusion and word finding difficulty, not present on admission.   Rule out breakthrough vs provoked seizure - Neurology following - appreciate insight/recs - EEG continue to be abnormal but no overt seizures noted on tracing - Likely decreased seizure threshold due to surgical stress, missed medications and subtherapeutic levels with very large left frontal lobe mass. - Labs unremarkable, B12 WNL, TSH 4.3, UA WNL, Ammonia WNL - Imaging (CT/MRI) show stable AV malformation in L frontal lobe 5x5 cm -Continue phenobarbital and Dilantin. Dilantin level now therapeutic, Keppra discontinued per neurology  Cholecystitis with cholelithiasis:  Lap chole 25/42, without complications, continue to advance diet as tolerated - currently full liquid diet.  Gen Sx signed off - follow up outpatient as scheduled. Daughter aware.   Hypomagnesemia: Severe and persistent. 1.1 at intake - repleted. Follow repeat labs.  Essential hypertension Hyperlipidemia  - Continue home medications, no changes   DVT prophylaxis: enoxaparin (LOVENOX) injection 40 mg Start: 04/24/22 1000   Code Status: DNR Family  Communication: Daughter updated at bedside 11/18 Disposition Plan: Status is: Inpatient Remains inpatient appropriate because: Ongoing altered mental status, ambulatory dysfunction need for placement.  Consultants:  General surgery(signed off), Neurology  Procedures:  Lap chole 11/13  Antimicrobials:  Cefepime and Flagyl 11/12---11/15   Subjective: No acute issue/events overnight.  Patient much more awake alert and oriented per daughter at bedside this morning but not yet back to baseline.  Patient declines nausea vomiting diarrhea constipation headache fevers chills or chest pain   Objective: Vitals:   04/27/22 1948 04/27/22 2340 04/28/22 0502 04/28/22 0724  BP: (!) 167/44 (!) 158/47 (!) 176/50 (!) 164/51  Pulse: (!) 59 60 (!) 59 60  Resp: '18 16 16 18  '$ Temp: 98.4 F (36.9 C) 98 F (36.7 C) (!) 97.5 F (36.4 C) 98.3 F (36.8 C)  TempSrc: Oral Oral Oral   SpO2: 100% 100% 100% 98%  Weight:      Height:        Intake/Output Summary (Last 24 hours) at 04/28/2022 0751 Last data filed at 04/27/2022 1600 Gross per 24 hour  Intake 360 ml  Output --  Net 360 ml    Filed Weights   04/22/22 0406 04/26/22 2215  Weight: 62.1 kg 62.6 kg    Examination:  General exam: Awake and alert - calm and pleasant, conversive alert oriented x3, following commands Respiratory system: Clear to auscultation. Respiratory effort normal. Cardiovascular system: S1 & S2 heard, RRR.  Pacemaker in place.   Gastrointestinal system: Post op site clean and dry.  Nontender on exam. Soft, nondistended. Extremities: Symmetric 5 x 5 power.  Moves all extremities. Skin: No rashes, lesions or ulcers Psychiatry: Judgement and insight intact  Data Reviewed: I have personally reviewed following labs and imaging studies  CBC: Recent Labs  Lab 04/22/22 0434 04/23/22 0811 04/24/22 0504 04/25/22 0735 04/26/22 0901  WBC 13.3* 15.4* 11.3* 8.9 5.7  NEUTROABS 11.9*  --  10.0* 7.6  --   HGB 10.2* 9.6*  9.1* 9.5* 10.2*  HCT 31.7* 29.3* 28.1* 29.3* 32.4*  MCV 104.3* 103.2* 104.5* 103.5* 106.2*  PLT 288 249 237 274 465    Basic Metabolic Panel: Recent Labs  Lab 04/22/22 0434 04/22/22 1826 04/23/22 0811 04/24/22 0504 04/25/22 0735 04/26/22 0901  NA 141  --  136 136 135 136  K 3.7  --  4.2 3.7 3.7 3.8  CL 105  --  101 103 103 105  CO2 27  --  25 22 19* 21*  GLUCOSE 140*  --  116* 91 89 98  BUN 25*  --  25* 29* 29* 27*  CREATININE 1.05*  --  1.25* 1.14* 1.32* 1.24*  CALCIUM 8.2* 8.1* 8.0* 8.1* 8.4* 8.6*  MG  --  1.1*  --  1.8 1.9  --   PHOS  --   --   --  4.4 3.5  --     GFR: Estimated Creatinine Clearance: 31.6 mL/min (A) (by C-G formula based on SCr of 1.24 mg/dL (H)). Liver Function Tests: Recent Labs  Lab 04/22/22 0434 04/23/22 0811 04/24/22 0504 04/25/22 0735 04/26/22 0901  AST '29 25 24 21 18  '$ ALT '18 16 16 15 12  '$ ALKPHOS 68 58 52 53 54  BILITOT 0.6 0.9 0.7 1.1 0.9  PROT 7.0 6.3* 5.8* 5.9* 5.9*  ALBUMIN 3.7 3.2* 2.8* 2.8* 2.6*    Recent Labs  Lab 04/22/22 0434  LIPASE 42    Recent Labs  Lab 04/26/22 0901  AMMONIA 18    Coagulation Profile: No results for input(s): "INR", "PROTIME" in the last 168 hours. Cardiac Enzymes: No results for input(s): "CKTOTAL", "CKMB", "CKMBINDEX", "TROPONINI" in the last 168 hours. BNP (last 3 results) No results for input(s): "PROBNP" in the last 8760 hours. HbA1C: No results for input(s): "HGBA1C" in the last 72 hours.  CBG: Recent Labs  Lab 04/23/22 2037  GLUCAP 122*    Lipid Profile: No results for input(s): "CHOL", "HDL", "LDLCALC", "TRIG", "CHOLHDL", "LDLDIRECT" in the last 72 hours. Thyroid Function Tests: No results for input(s): "TSH", "T4TOTAL", "FREET4", "T3FREE", "THYROIDAB" in the last 72 hours.  Anemia Panel: No results for input(s): "VITAMINB12", "FOLATE", "FERRITIN", "TIBC", "IRON", "RETICCTPCT" in the last 72 hours.  Sepsis Labs: No results for input(s): "PROCALCITON", "LATICACIDVEN" in the  last 168 hours.  No results found for this or any previous visit (from the past 240 hour(s)).       Radiology Studies: Overnight EEG with video  Result Date: 04/27/2022 Kelsey Havens, MD     04/27/2022 12:10 PM Patient Name: [name] MRN: [id number] Epilepsy Attending: Lora Little Referring Physician/Provider: Lorenza Chick, MD Duration: 04/26/2022 2330  to 11/17/20231200  Patient history: 86yo F with ams. EEG to evaluate for seizure  Level of alertness: Awake, asleep  AEDs during EEG study: LEV  Technical aspects: This EEG study was done with scalp electrodes positioned according to the 10-20 International system of electrode placement. Electrical activity was reviewed with band pass filter of 1-'70Hz'$ , sensitivity of 7 uV/mm, display speed of 31m/sec with a '60Hz'$  notched filter applied as appropriate. EEG data were recorded continuously and digitally stored.  Video monitoring was available and reviewed as appropriate.  Description: No clear posterior dominant rhythm was seen. Sleep was characterized by sleep spindles (12-'14hz'$ ), maximal fronto-central  region. EEG showed continuous generalized 3 to 6 Hz theta-delta slowing. Generalized periodic discharges with triphasic morphology were noted at '1hz'$ , more prominent when awake/stimulated. Hyperventilation and photic stimulation were not performed.   ABNORMALITY - Periodic discharges with triphasic morphology, generalized - Continuous slow, generalized  IMPRESSION: This study showed generalized periodic discharges with triphasic morphology which can be on the ictal-interictal continuum. However, the morphology, frequency and reactivity to stimulation is more commonly indicative of toxic-metabolic causes. Additionally there is moderate diffuse encephalopathy, nonspecific etiology. No seizures were seen throughout the recording.  [name]   EEG adult  Result Date: 04/26/2022 Kelsey Havens, MD     04/26/2022 11:57 AM Patient  Name: KRISLYNN GRONAU MRN: [id number] Epilepsy Attending: Lora Little Referring Physician/Provider: Barb Merino, MD Date:04/26/2022 Duration: 23.22 mins Patient history: 86yo F with ams. EEG to evaluate for seizure Level of alertness: Awake AEDs during EEG study: LEV Technical aspects: This EEG study was done with scalp electrodes positioned according to the 10-20 International system of electrode placement. Electrical activity was reviewed with band pass filter of 1-'70Hz'$ , sensitivity of 7 uV/mm, display speed of 20m/sec with a '60Hz'$  notched filter applied as appropriate. EEG data were recorded continuously and digitally stored.  Video monitoring was available and reviewed as appropriate. Description: EEG showed continuous generalized 3 to 6 Hz theta-delta slowing. Hyperventilation and photic stimulation were not performed.   Of note, study was technically difficult due to significant movement and myogenic artifact. ABNORMALITY - Continuous slow, generalized IMPRESSION: This technically difficult study is suggestive of moderate diffuse encephalopathy, nonspecific etiology.  No seizures or epileptiform discharges were seen throughout the recording. Priyanka OBarbra Sarks       Scheduled Meds:  Chlorhexidine Gluconate Cloth  6 each Topical Daily   enoxaparin (LOVENOX) injection  40 mg Subcutaneous Q24H   hydrALAZINE  25 mg Oral BID   irbesartan  150 mg Oral Daily   magnesium oxide  400 mg Oral BID   PHENObarbital  65 mg Intravenous Daily   phenytoin (DILANTIN) IV  100 mg Intravenous Q12H   Continuous Infusions:  levETIRAcetam 500 mg (04/27/22 2209)     LOS: 4 days    Time spent: 35 minutes    WLittle Ishikawa DO [facility] Pager [phone]

## 2022-04-28 NOTE — Progress Notes (Addendum)
Neurology Progress Note   S:// Patient is awake and alert at bedside. Daughter arrived after seeing patient, spoke and updated her on plan.  LTM is on, no seizures.  EEG improved from yesterday and so is the exam. Patient is talking in full sentences. No new neurological events overnight.    O:// Current vital signs: BP (!) 164/51 (BP Location: Left Arm)   Pulse 60   Temp 98.3 F (36.8 C)   Resp 18   Ht '5\' 6"'$  (1.676 m)   Wt 62.6 kg   SpO2 98%   BMI 22.28 kg/m  Vital signs in last 24 hours: Temp:  [97.5 F (36.4 C)-98.4 F (36.9 C)] 98.3 F (36.8 C) (11/18 0724) Pulse Rate:  [59-60] 60 (11/18 0724) Resp:  [16-18] 18 (11/18 0724) BP: (139-176)/(43-51) 164/51 (11/18 0724) SpO2:  [98 %-100 %] 98 % (11/18 0724) General: Awake alert in no distress HEENT: Normocephalic and atraumatic Lungs: Clear Abdomen: Mildly tender at the surgical site but nondistended Extremities warm well perfused Neurological exam Awake alert.  Still mild aphasia-perseverates.  No slurred speech. Following commands, able to name objects. Asking when we can remove EEG leads  She said she remembered [place] from yesterday.  Cranial nerves II 12 intact Motor exam moves all extremities equally and spontaneously, able to lift antigravity  Coordination intact  Medications  Current Facility-Administered Medications:    acetaminophen (TYLENOL) 160 MG/5ML solution 1,000 mg, 1,000 mg, Oral, Q6H PRN, Barb Merino, MD   Chlorhexidine Gluconate Cloth 2 % PADS 6 each, 6 each, Topical, Daily, Ghimire, Kuber, MD, 6 each at 04/28/22 0858   diphenhydrAMINE (BENADRYL) injection 12.5 mg, 12.5 mg, Intravenous, Q6H PRN, Meuth, Brooke A, PA-C   enoxaparin (LOVENOX) injection 40 mg, 40 mg, Subcutaneous, Q24H, Meuth, Brooke A, PA-C, 40 mg at 04/28/22 0858   hydrALAZINE (APRESOLINE) injection 10 mg, 10 mg, Intravenous, Q4H PRN, Barb Merino, MD   hydrALAZINE (APRESOLINE) tablet 25 mg, 25 mg, Oral, BID, Meuth, Brooke A, PA-C,  25 mg at 04/28/22 0857   irbesartan (AVAPRO) tablet 150 mg, 150 mg, Oral, Daily, Meuth, Brooke A, PA-C, 150 mg at 04/28/22 0857   levETIRAcetam (KEPPRA) IVPB 500 mg/100 mL premix, 500 mg, Intravenous, Q12H, Ghimire, Kuber, MD, Last Rate: 400 mL/hr at 04/27/22 2209, 500 mg at 04/27/22 2209   magnesium oxide (MAG-OX) tablet 400 mg, 400 mg, Oral, BID, Ghimire, Kuber, MD, 400 mg at 04/28/22 0857   metoprolol tartrate (LOPRESSOR) injection 5 mg, 5 mg, Intravenous, Q6H PRN, Meuth, Brooke A, PA-C, 5 mg at 04/22/22 1830   morphine (PF) 2 MG/ML injection 2 mg, 2 mg, Intravenous, Q3H PRN, Meuth, Brooke A, PA-C, 2 mg at 04/25/22 1312   Oral care mouth rinse, 15 mL, Mouth Rinse, PRN, Barb Merino, MD   oxyCODONE (Oxy IR/ROXICODONE) immediate release tablet 2.5-5 mg, 2.5-5 mg, Oral, Q4H PRN, Meuth, Brooke A, PA-C   PHENObarbital (LUMINAL) injection 65 mg, 65 mg, Intravenous, Daily, Ghimire, Kuber, MD, 65 mg at 04/28/22 0858   phenytoin (DILANTIN) injection 100 mg, 100 mg, Intravenous, Q12H, Ghimire, Kuber, MD, 100 mg at 04/28/22 0858   prochlorperazine (COMPAZINE) injection 10 mg, 10 mg, Intravenous, Q6H PRN, Meuth, Brooke A, PA-C, 10 mg at 04/22/22 1830 Labs CBC    Component Value Date/Time   WBC 5.7 04/26/2022 0901   RBC 3.05 (L) 04/26/2022 0901   HGB 10.2 (L) 04/26/2022 0901   HCT 32.4 (L) 04/26/2022 0901   PLT 283 04/26/2022 0901   MCV 106.2 (H) 04/26/2022 0901  MCH 33.4 04/26/2022 0901   MCHC 31.5 04/26/2022 0901   RDW 15.2 04/26/2022 0901   LYMPHSABS 0.4 (L) 04/25/2022 0735   MONOABS 0.8 04/25/2022 0735   EOSABS 0.0 04/25/2022 0735   BASOSABS 0.0 04/25/2022 0735    CMP     Component Value Date/Time   NA 136 04/26/2022 0901   NA 137 04/03/2022 0000   K 3.8 04/26/2022 0901   CL 105 04/26/2022 0901   CO2 21 (L) 04/26/2022 0901   GLUCOSE 98 04/26/2022 0901   BUN 27 (H) 04/26/2022 0901   BUN 19 04/03/2022 0000   CREATININE 1.24 (H) 04/26/2022 0901   CREATININE 1.26 (H) 06/21/2021  1430   CALCIUM 8.6 (L) 04/26/2022 0901   CALCIUM 8.1 (L) 04/22/2022 1826   PROT 5.9 (L) 04/26/2022 0901   ALBUMIN 2.6 (L) 04/26/2022 0901   AST 18 04/26/2022 0901   ALT 12 04/26/2022 0901   ALKPHOS 54 04/26/2022 0901   BILITOT 0.9 04/26/2022 0901   GFRNONAA 43 (L) 04/26/2022 0901   GFRAA 53.34 04/25/2021 0000   Imaging I have reviewed images in epic and the results pertinent to this consultation are: MR brain with large left frontal AVM.  No stroke  Phenobarbital level 17.5 11/17 Phenytoin level 5.9 with albumin 2.6 --> corrected level 7.2.  Overnight EEG with improvement-still remains generalized slow.  Assessment:  86 year old past medical history of a large left frontal AVM, on phenytoin and phenobarbital for seizures but has had been seizure-free for multiple years maybe decades, admitted for cholecystectomy and a day after surgery had significant difficulty with speech-on exam was globally aphasic. Subtherapeutic on Dilantin-had missed doses.  Phenobarbital was therapeutic. Spot EEG with no evidence of seizures but exam definitely pointing towards left hemispheric dysfunction.  No stroke on MRI. Transferred to [facility] for LTM EEG. Loaded with Keppra and started on Keppra while the Dilantin becomes therapeutic. Exam is much improved. Mental status is better able to hold a conversation  Impression: Breakthrough seizure in the setting of missed doses of antiepileptics and being on antibiotics that might of lowered seizure threshold as well as stressors such as surgery Evaluate for underlying status epilepticus given the presence of a large underlying left frontal AVM that might be a focus for the seizure.  Recommendations: Dilantin therapeutic today - continue 100 BID  dilantin - home dose Continue home dose of phenobarbital Keep antiepileptics intravenous for now Can DC keppra now. Discontinue LTM today  Maintain seizure precautions Postsurgical care per  medicine and surgery teams We will continue to follow D/W Dr. Sheral Apley DNP, [place]   Attending Neurohospitalist Addendum Patient seen and examined with APP/Resident. Agree with the history and physical as documented above. Agree with the plan as documented, which I helped formulate. I have independently reviewed the chart, obtained history, review of systems and examined the patient.I have personally reviewed pertinent head/neck/spine imaging (CT/MRI). Please feel free to call with any questions.  -- Amie Portland, MD Neurologist [facility] Pager: [phone]

## 2022-04-28 NOTE — Progress Notes (Signed)
LTM EEG discontinued - no skin breakdown at unhook.   

## 2022-04-28 NOTE — Progress Notes (Signed)
LTM maint complete - no skin breakdown under:  Fp1 Fp2 F8 F7 Serviced F7 [facility] monitored, Event button test confirmed by [facility].

## 2022-04-29 LAB — CREATININE, SERUM
Creatinine, Ser: 1.07 mg/dL — ABNORMAL HIGH (ref 0.44–1.00)
GFR, Estimated: 51 mL/min — ABNORMAL LOW (ref >60)

## 2022-04-29 NOTE — Progress Notes (Signed)
PROGRESS NOTE    [name]  BMW:[id number] DOB: 1935/10/20 DOA: 04/22/2022 PCP: Virgie Dad, MD    Brief Narrative:  86 year old from assisted living facility with history of permanent A-fib status post pacemaker, hypertension, complex partial epilepsy since childhood presented with 1 days of right upper quadrant abdominal pain and she was found to have calculus cholecystitis.  She was admitted to the hospital with IV fluids, antibiotics.  Underwent laparoscopic cholecystectomy with grossly infected gallbladder.  Good clinical recovery.  11/15.  After appropriate recovery from surgery, she started developing word finding difficulties and confusion.  Symptoms significantly worse overnight.  No focal deficits.  Persistently encephalopathic.  Assessment & Plan:   Acute metabolic encephalopathy, confusion and word finding difficulty, not present on admission.   Rule out breakthrough vs provoked seizure Likely concurrent hospital delirium - Neurology following - appreciate insight/recs - EEG continue to be abnormal but no overt seizures noted on tracing - Likely decreased seizure threshold due to surgical stress, missed medications and subtherapeutic levels with very large left frontal lobe mass. - Labs unremarkable - Imaging (CT/MRI) show stable AV malformation in L frontal lobe 5x5 cm but likely no relation to above symptoms -Continue phenobarbital and Dilantin. Dilantin level now therapeutic, Keppra discontinued per neurology -Mental status somewhat fluctuating over the past 24 hours, likely secondary to poor sleep hygiene, high risk for hospital delirium and sundowning  Cholecystitis with cholelithiasis:  Lap chole 27/25, without complications, continue to advance diet as tolerated - currently full liquid diet.  Gen Sx signed off - follow up outpatient as scheduled. Daughter aware.   Hypomagnesemia: Severe and persistent. 1.1 at intake - repleted. Follow repeat  labs.  Essential hypertension Hyperlipidemia  - Continue home medications, no changes   DVT prophylaxis: enoxaparin (LOVENOX) injection 40 mg Start: 04/24/22 1000   Code Status: DNR Family Communication: Daughter updated at bedside 11/18 Disposition Plan: Status is: Inpatient Remains inpatient appropriate because: Ongoing altered mental status, ambulatory dysfunction need for placement.  Consultants:  General surgery(signed off), Neurology  Procedures:  Lap chole 11/13  Antimicrobials:  Cefepime and Flagyl 11/12---11/15   Subjective: Worsening mental status and confusion overnight, review of systems somewhat limited today with the patient but denies overt fevers chills chest pain shortness of breath.  Objective: Vitals:   04/28/22 1638 04/28/22 1947 04/29/22 0127 04/29/22 0438  BP: (!) 157/44 (!) 145/40 (!) 137/42 (!) 167/39  Pulse:  60 61 60  Resp: '18 18 20 18  '$ Temp: 98 F (36.7 C) 97.7 F (36.5 C) 97.6 F (36.4 C) 98.3 F (36.8 C)  TempSrc: Oral Oral Oral Oral  SpO2:  98% 97% 96%  Weight:      Height:        Intake/Output Summary (Last 24 hours) at 04/29/2022 0748 Last data filed at 04/29/2022 0700 Gross per 24 hour  Intake --  Output 500 ml  Net -500 ml    Filed Weights   04/22/22 0406 04/26/22 2215  Weight: 62.1 kg 62.6 kg    Examination:  General exam: Awake and alert - calm and pleasant, conversive alert oriented x3 but rambling on occasion about the noise in the hospital, following commands Respiratory system: Clear to auscultation. Respiratory effort normal. Cardiovascular system: S1 & S2 heard, RRR.  Pacemaker in place.   Gastrointestinal system: Post op site clean and dry.  Nontender on exam. Soft, nondistended. Extremities: Symmetric 5 x 5 power.  Moves all extremities. Skin: No rashes, lesions or ulcers  Data Reviewed:  I have personally reviewed following labs and imaging studies  CBC: Recent Labs  Lab 04/23/22 0811 04/24/22 0504  04/25/22 0735 04/26/22 0901  WBC 15.4* 11.3* 8.9 5.7  NEUTROABS  --  10.0* 7.6  --   HGB 9.6* 9.1* 9.5* 10.2*  HCT 29.3* 28.1* 29.3* 32.4*  MCV 103.2* 104.5* 103.5* 106.2*  PLT 249 237 274 299    Basic Metabolic Panel: Recent Labs  Lab 04/22/22 1826 04/23/22 0811 04/24/22 0504 04/25/22 0735 04/26/22 0901 04/29/22 0436  NA  --  136 136 135 136  --   K  --  4.2 3.7 3.7 3.8  --   CL  --  101 103 103 105  --   CO2  --  25 22 19* 21*  --   GLUCOSE  --  116* 91 89 98  --   BUN  --  25* 29* 29* 27*  --   CREATININE  --  1.25* 1.14* 1.32* 1.24* 1.07*  CALCIUM 8.1* 8.0* 8.1* 8.4* 8.6*  --   MG 1.1*  --  1.8 1.9  --   --   PHOS  --   --  4.4 3.5  --   --     GFR: Estimated Creatinine Clearance: 36.6 mL/min (A) (by C-G formula based on SCr of 1.07 mg/dL (H)). Liver Function Tests: Recent Labs  Lab 04/23/22 0811 04/24/22 0504 04/25/22 0735 04/26/22 0901  AST '25 24 21 18  '$ ALT '16 16 15 12  '$ ALKPHOS 58 52 53 54  BILITOT 0.9 0.7 1.1 0.9  PROT 6.3* 5.8* 5.9* 5.9*  ALBUMIN 3.2* 2.8* 2.8* 2.6*    No results for input(s): "LIPASE", "AMYLASE" in the last 168 hours.  Recent Labs  Lab 04/26/22 0901  AMMONIA 18    Coagulation Profile: No results for input(s): "INR", "PROTIME" in the last 168 hours. Cardiac Enzymes: No results for input(s): "CKTOTAL", "CKMB", "CKMBINDEX", "TROPONINI" in the last 168 hours. BNP (last 3 results) No results for input(s): "PROBNP" in the last 8760 hours. HbA1C: No results for input(s): "HGBA1C" in the last 72 hours.  CBG: Recent Labs  Lab 04/23/22 2037  GLUCAP 122*    Lipid Profile: No results for input(s): "CHOL", "HDL", "LDLCALC", "TRIG", "CHOLHDL", "LDLDIRECT" in the last 72 hours. Thyroid Function Tests: No results for input(s): "TSH", "T4TOTAL", "FREET4", "T3FREE", "THYROIDAB" in the last 72 hours.  Anemia Panel: No results for input(s): "VITAMINB12", "FOLATE", "FERRITIN", "TIBC", "IRON", "RETICCTPCT" in the last 72  hours.  Sepsis Labs: No results for input(s): "PROCALCITON", "LATICACIDVEN" in the last 168 hours.  No results found for this or any previous visit (from the past 240 hour(s)).       Radiology Studies: Overnight EEG with video  Result Date: 04/27/2022 Lora Havens, MD     04/28/2022  8:46 AM Patient Name: [name] MRN: [id number] Epilepsy Attending: Lora Havens Referring Physician/Provider: Lorenza Chick, MD Duration: 04/26/2022 2330  to 04/27/2022 2330  Patient history: 86yo F with ams. EEG to evaluate for seizure  Level of alertness: Awake, asleep  AEDs during EEG study: LEV  Technical aspects: This EEG study was done with scalp electrodes positioned according to the 10-20 International system of electrode placement. Electrical activity was reviewed with band pass filter of 1-'70Hz'$ , sensitivity of 7 uV/mm, display speed of 49m/sec with a '60Hz'$  notched filter applied as appropriate. EEG data were recorded continuously and digitally stored.  Video monitoring was available and reviewed as appropriate.  Description: No clear  posterior dominant rhythm was seen. Sleep was characterized by sleep spindles (12-'14hz'$ ), maximal fronto-central region. EEG showed continuous generalized 3 to 6 Hz theta-delta slowing. Generalized periodic discharges with triphasic morphology were noted at '1hz'$ , more prominent when awake/stimulated. Hyperventilation and photic stimulation were not performed.   ABNORMALITY - Periodic discharges with triphasic morphology, generalized - Continuous slow, generalized  IMPRESSION: This study showed generalized periodic discharges with triphasic morphology which can be on the ictal-interictal continuum. However, the morphology, frequency and reactivity to stimulation is more commonly indicative of toxic-metabolic causes. Additionally there is moderate diffuse encephalopathy, nonspecific etiology. No seizures were seen throughout the recording.  Priyanka [name]         Scheduled Meds:  Chlorhexidine Gluconate Cloth  6 each Topical Daily   enoxaparin (LOVENOX) injection  40 mg Subcutaneous Q24H   hydrALAZINE  25 mg Oral BID   irbesartan  150 mg Oral Daily   magnesium oxide  400 mg Oral BID   PHENObarbital  65 mg Intravenous Daily   phenytoin (DILANTIN) IV  100 mg Intravenous Q12H   Continuous Infusions:     LOS: 5 days    Time spent: 35 minutes    Little Ishikawa, DO [facility] Pager [phone]

## 2022-04-29 NOTE — Progress Notes (Addendum)
Neurology Progress Note   S:// Patient seen and examined. Sleeping in bed, initially when we went earlier around 8 AM, she wanted [place] to come back later she went to sleep more. I went back around 11:15 AM and woke her up again.  Her sheets were a little wet without her realizing.  The nurse told me that she had some water which she might of spilled.  Nursing staff will get her cleaned up. Did not offer any new complaints except for some spasms in her hip.   O:// Current vital signs: BP (!) 160/34 (BP Location: Right Arm)   Pulse 60   Temp 98.4 F (36.9 C) (Oral)   Resp 18   Ht '5\' 6"'$  (1.676 m)   Wt 62.6 kg   SpO2 97%   BMI 22.28 kg/m  Vital signs in last 24 hours: Temp:  [97.6 F (36.4 C)-98.4 F (36.9 C)] 98.4 F (36.9 C) (11/19 4696) Pulse Rate:  [60-61] 60 (11/19 0952) Resp:  [18-20] 18 (11/19 0952) BP: (137-167)/(34-50) 160/34 (11/19 0952) SpO2:  [96 %-98 %] 97 % (11/19 0952) General: Awake alert in no distress HEENT: Normocephalic and atraumatic Lungs: Clear Abdomen: Mildly tender at the surgical site but nondistended Extremities warm well perfused Neurological exam Awake alert.  She was able to follow simple commands and have a conversation although she still looks to be confused about where she is and what is going on.  She also still perseverates intermittently. Her speech is not dysarthric Cranial nerves II 12 intact Motor exam moves all extremities equally and spontaneously, able to lift antigravity  Coordination intact  Medications  Current Facility-Administered Medications:    acetaminophen (TYLENOL) 160 MG/5ML solution 1,000 mg, 1,000 mg, Oral, Q6H PRN, Barb Merino, MD   Chlorhexidine Gluconate Cloth 2 % PADS 6 each, 6 each, Topical, Daily, Ghimire, Kuber, MD, 6 each at 04/29/22 0917   diphenhydrAMINE (BENADRYL) injection 12.5 mg, 12.5 mg, Intravenous, Q6H PRN, Meuth, Brooke A, PA-C   enoxaparin (LOVENOX) injection 40 mg, 40 mg, Subcutaneous, Q24H,  Meuth, Brooke A, PA-C, 40 mg at 04/29/22 2952   hydrALAZINE (APRESOLINE) injection 10 mg, 10 mg, Intravenous, Q4H PRN, Barb Merino, MD   hydrALAZINE (APRESOLINE) tablet 25 mg, 25 mg, Oral, BID, Meuth, Brooke A, PA-C, 25 mg at 04/29/22 0914   irbesartan (AVAPRO) tablet 150 mg, 150 mg, Oral, Daily, Meuth, Brooke A, PA-C, 150 mg at 04/29/22 0914   magnesium oxide (MAG-OX) tablet 400 mg, 400 mg, Oral, BID, Ghimire, Kuber, MD, 400 mg at 04/29/22 0914   metoprolol tartrate (LOPRESSOR) injection 5 mg, 5 mg, Intravenous, Q6H PRN, Meuth, Brooke A, PA-C, 5 mg at 04/22/22 1830   morphine (PF) 2 MG/ML injection 2 mg, 2 mg, Intravenous, Q3H PRN, Meuth, Brooke A, PA-C, 2 mg at 04/25/22 1312   Oral care mouth rinse, 15 mL, Mouth Rinse, PRN, Barb Merino, MD   oxyCODONE (Oxy IR/ROXICODONE) immediate release tablet 2.5-5 mg, 2.5-5 mg, Oral, Q4H PRN, Meuth, Brooke A, PA-C   PHENObarbital (LUMINAL) injection 65 mg, 65 mg, Intravenous, Daily, Ghimire, Kuber, MD, 65 mg at 04/29/22 0915   phenytoin (DILANTIN) injection 100 mg, 100 mg, Intravenous, Q12H, Ghimire, Kuber, MD, 100 mg at 04/29/22 0915   prochlorperazine (COMPAZINE) injection 10 mg, 10 mg, Intravenous, Q6H PRN, Meuth, Brooke A, PA-C, 10 mg at 04/22/22 1830 Labs CBC    Component Value Date/Time   WBC 5.7 04/26/2022 0901   RBC 3.05 (L) 04/26/2022 0901   HGB 10.2 (L) 04/26/2022 0901  HCT 32.4 (L) 04/26/2022 0901   PLT 283 04/26/2022 0901   MCV 106.2 (H) 04/26/2022 0901   MCH 33.4 04/26/2022 0901   MCHC 31.5 04/26/2022 0901   RDW 15.2 04/26/2022 0901   LYMPHSABS 0.4 (L) 04/25/2022 0735   MONOABS 0.8 04/25/2022 0735   EOSABS 0.0 04/25/2022 0735   BASOSABS 0.0 04/25/2022 0735    CMP     Component Value Date/Time   NA 136 04/26/2022 0901   NA 137 04/03/2022 0000   K 3.8 04/26/2022 0901   CL 105 04/26/2022 0901   CO2 21 (L) 04/26/2022 0901   GLUCOSE 98 04/26/2022 0901   BUN 27 (H) 04/26/2022 0901   BUN 19 04/03/2022 0000   CREATININE  1.07 (H) 04/29/2022 0436   CREATININE 1.26 (H) 06/21/2021 1430   CALCIUM 8.6 (L) 04/26/2022 0901   CALCIUM 8.1 (L) 04/22/2022 1826   PROT 5.9 (L) 04/26/2022 0901   ALBUMIN 2.6 (L) 04/26/2022 0901   AST 18 04/26/2022 0901   ALT 12 04/26/2022 0901   ALKPHOS 54 04/26/2022 0901   BILITOT 0.9 04/26/2022 0901   GFRNONAA 51 (L) 04/29/2022 0436   GFRAA 53.34 04/25/2021 0000   Imaging I have reviewed images in epic and the results pertinent to this consultation are: MR brain with large left frontal AVM.  No stroke  Phenobarbital level 17.5 11/17 Phenytoin level 5.9 with albumin 2.6 --> corrected level 7.2.  Overnight EEG with improvement-still remains generalized slow.  Assessment:  86 year old past medical history of a large left frontal AVM, on phenytoin and phenobarbital for seizures but has had been seizure-free for multiple years maybe decades, admitted for cholecystectomy and a day after surgery had significant difficulty with speech-on exam was globally aphasic. Subtherapeutic on Dilantin-had missed doses.  Phenobarbital was therapeutic. Spot EEG with no evidence of seizures but exam definitely pointing towards left hemispheric dysfunction.  No stroke on MRI. Transferred to [facility] for LTM EEG. Loaded with Keppra and started on Keppra while the Dilantin becomes therapeutic.  Finally Dilantin at therapeutic level and Keppra discontinued yesterday. Exam is slowly improving.  I do suspect that she has underlying cognitive deficits which probably make her a little bit more delirious in the hospital setting.  I suspect she would continue to improve with improvement in her general health and recovery from the surgery.  Impression: Breakthrough seizure and possible status epilepticus-now resolved in the setting of missed doses of antiepileptics and being on antibiotics that might of lowered seizure threshold as well as stressors such as surgery Prolonged postictal state versus  hospital delirium  Recommendations: Continue home dose phenobarbital and Dilantin. Switch antiepileptics to p.o. when consistently taking p.o. diet. Maintain seizure precautions Postsurgical care per medicine and surgery teams Outpatient neurological follow-up in 8 to 12 weeks-does not have a neurologist and has not seen one in a while, would be beneficial to have a neurologist involved in her care outpatient.  Inpatient neurology will be available to with questions as needed. D/W Dr. Avon Gully  -- Amie Portland, MD Neurologist [facility] Pager: [phone]

## 2022-04-30 ENCOUNTER — Encounter: Payer: Self-pay | Admitting: Internal Medicine

## 2022-04-30 ENCOUNTER — Non-Acute Institutional Stay (SKILLED_NURSING_FACILITY): Payer: Medicare Other | Admitting: Internal Medicine

## 2022-04-30 ENCOUNTER — Encounter: Payer: Medicare Other | Admitting: Adult Health

## 2022-04-30 DIAGNOSIS — I10 Essential (primary) hypertension: Secondary | ICD-10-CM

## 2022-04-30 DIAGNOSIS — I480 Paroxysmal atrial fibrillation: Secondary | ICD-10-CM

## 2022-04-30 DIAGNOSIS — R410 Disorientation, unspecified: Secondary | ICD-10-CM | POA: Diagnosis not present

## 2022-04-30 DIAGNOSIS — G40209 Localization-related (focal) (partial) symptomatic epilepsy and epileptic syndromes with complex partial seizures, not intractable, without status epilepticus: Secondary | ICD-10-CM

## 2022-04-30 DIAGNOSIS — D509 Iron deficiency anemia, unspecified: Secondary | ICD-10-CM

## 2022-04-30 DIAGNOSIS — G629 Polyneuropathy, unspecified: Secondary | ICD-10-CM | POA: Diagnosis not present

## 2022-04-30 DIAGNOSIS — Z950 Presence of cardiac pacemaker: Secondary | ICD-10-CM

## 2022-04-30 DIAGNOSIS — E782 Mixed hyperlipidemia: Secondary | ICD-10-CM

## 2022-04-30 DIAGNOSIS — Z9049 Acquired absence of other specified parts of digestive tract: Secondary | ICD-10-CM | POA: Diagnosis not present

## 2022-04-30 DIAGNOSIS — R600 Localized edema: Secondary | ICD-10-CM | POA: Diagnosis not present

## 2022-04-30 DIAGNOSIS — R197 Diarrhea, unspecified: Secondary | ICD-10-CM

## 2022-04-30 NOTE — Progress Notes (Signed)
Location: Occupational psychologist of Service:  SNF (31)  Provider:   Code Status: DNR Goals of Care:     04/22/2022    4:00 PM  Advanced Directives  Does Patient Have a Medical Advance Directive? Yes  Type of Paramedic of [place];Living will;Out of facility DNR (pink MOST or yellow form)  Does patient want to make changes to medical advance directive? No - Patient declined  Copy of [place] in Chart? Yes - validated most recent copy scanned in chart (See row information)     Chief Complaint  Patient presents with   Readmit To SNF    HPI: Patient is a 86 y.o. female seen today for an acute visit for Radmit to Rehab  Admitted in the hospital from 11/12 to 11/24 acute cholecystitis, laparoscopic cholecystectomy and possibly postprocedure seizure  Was sent to the hospital for right upper quadrant abdominal pain with nausea and vomiting Was diagnosed with an acute cholecystitis underwent cholecystectomy laparoscopic on 04/23/22 11/15 patient was seen to have issues with word finding and confusion MRI was negative for any acute stroke.  EEG did not show any evidence of seizures Neurology thought patient had a breakthrough seizure due to missing antiepileptic and being on antibiotics as well as stressor from surgery No changes were made in her medications  Patient now in rehab Continues to be little confused about the events. Per her son still very weak and not able to walk and do her ADLs Had no other acute complaints no nausea vomiting or abdominal pain   Patient patient [place] Has history of chronic diarrhea with recent diagnosis of C. difficile treated with oral Vanco,  S/p PPM placement on 05/30 for CHB Chest pain in 08/19 Diagnosed with Pericarditis with Pericardial Effusion Was on Colchicine A Fib Amiodarone tried but then taken off due to GI side effects Not on anticoagulation due to  cerebral AVMs.  Not a candidate for Watchman device either Hypertension,LE edema  History ofcomplex partial epilepsy since she was in her teen Had a work-up at [facility] and has been on Dilantin since then History of unhealed right humerus shaft fracture Peripheral neuropathy    Past Medical History:  Diagnosis Date   Abnormal cardiovascular stress test    Carotid arterial disease (HCC)    MODERATE RIGHT   Cerebral AV malformation    DDD (degenerative disc disease)    SEVERE WITH SPINAL STENOSIS   Hypercholesterolemia    Hypertension    MVP (mitral valve prolapse)    MILD   Numbness    LEFT FOOT AND ANKLE   Osteopenia    Presence of permanent cardiac pacemaker    Pseudo-gout     Past Surgical History:  Procedure Laterality Date   APPENDECTOMY     BREAST MASS EXCISION     CARPAL TUNNEL RELEASE     CHOLECYSTECTOMY N/A 04/23/2022   Procedure: LAPAROSCOPIC CHOLECYSTECTOMY;  Surgeon: Coralie Keens, MD;  Location: [facility];  Service: General;  Laterality: N/A;   PACEMAKER IMPLANT N/A 11/07/2021   Procedure: PACEMAKER IMPLANT;  Surgeon: Evans Lance, MD;  Location: [facility];  Service: Cardiovascular;  Laterality: N/A;   REMOVAL OF PARATHYROID GLAND     STRESS NUCLEAR STUDY     ABNORMAL   TONSILLECTOMY      Allergies  Allergen Reactions   Aleve [Naproxen] Other (See Comments)   Azithromycin Other (See Comments)  Reaction not recalled   Penicillins Hives and Other (See Comments)    Bad reaction as a child after a shot of Penicillin   Pravastatin Other (See Comments)    Reaction not recalled   Sulfa Antibiotics Other (See Comments)   Sulfa Drugs Cross Reactors Other (See Comments)    Reaction not recalled- stated she can take it "in small doses"   Tape Itching and Other (See Comments)    Cannot wear for an extended period of time   Naproxen Sodium Rash    Outpatient Encounter Medications as of 04/30/2022  Medication Sig   acetaminophen (TYLENOL) 500 MG  tablet Take 500 mg by mouth every 6 (six) hours as needed for mild pain or headache.   B Complex Vitamins (VITAMIN-B COMPLEX) TABS Take 1 tablet by mouth daily.   Dextromethorphan-guaiFENesin (ROBAFEN DM) 10-100 MG/5ML liquid Take 10 mLs by mouth every 6 (six) hours as needed (cough/congestion).   fluticasone (FLONASE) 50 MCG/ACT nasal spray Place 1-2 sprays into both nostrils daily.   hydrALAZINE (APRESOLINE) 25 MG tablet Take 25 mg by mouth in the morning and at bedtime.   loperamide (IMODIUM A-D) 2 MG tablet Take 1 tablet (2 mg total) by mouth as needed for diarrhea or loose stools. (Patient taking differently: Take 2 mg by mouth every 6 (six) hours as needed for diarrhea or loose stools.)   magnesium oxide (MAG-OX) 400 (240 Mg) MG tablet Take 1 tablet (400 mg total) by mouth daily.   Multiple Vitamins-Minerals (MULTI COMPLETE/IRON) TABS Take 1 tablet by mouth daily with breakfast. Folic acid included   Nutritional Supplements (NUTRITIONAL DRINK PO) Take 1 Bottle by mouth daily. Chocolate Boost or Ensure with lunch   ondansetron (ZOFRAN-ODT) 4 MG disintegrating tablet Take 4 mg by mouth every 4 (four) hours as needed for nausea or vomiting.   PHENobarbital (LUMINAL) 64.8 MG tablet Take 1 tablet (64.8 mg total) by mouth in the morning.   phenytoin (DILANTIN) 100 MG ER capsule Take 200 mg by mouth daily with breakfast.   Polyethyl Glycol-Propyl Glycol (SYSTANE ULTRA) 0.4-0.3 % SOLN Place 1 drop into both eyes in the morning and at bedtime.   Probiotic Product (ALIGN) 4 MG CAPS Take 4 mg by mouth daily.   valsartan (DIOVAN) 160 MG tablet Take 1 tablet (160 mg total) by mouth daily.   [DISCONTINUED] furosemide (LASIX) 20 MG tablet Take 20 mg by mouth 3 (three) times a week. Monday, Wednesday, Friday for 2 weeks starting on 04/18/22   [DISCONTINUED] potassium chloride SA (KLOR-CON M) 20 MEQ tablet Take 20 mEq by mouth 3 (three) times a week. Monday, Wednesday, Friday for 2 weeks starting on 04/18/22    [DISCONTINUED] acetaminophen (TYLENOL) 160 MG/5ML solution 1,000 mg    [DISCONTINUED] Chlorhexidine Gluconate Cloth 2 % PADS 6 each    [DISCONTINUED] diphenhydrAMINE (BENADRYL) injection 12.5 mg    [DISCONTINUED] enoxaparin (LOVENOX) injection 40 mg    [DISCONTINUED] hydrALAZINE (APRESOLINE) injection 10 mg    [DISCONTINUED] hydrALAZINE (APRESOLINE) tablet 25 mg    [DISCONTINUED] irbesartan (AVAPRO) tablet 150 mg    [DISCONTINUED] magnesium oxide (MAG-OX) tablet 400 mg    [DISCONTINUED] metoprolol tartrate (LOPRESSOR) injection 5 mg    [DISCONTINUED] morphine (PF) 2 MG/ML injection 2 mg    [DISCONTINUED] Oral care mouth rinse    [DISCONTINUED] oxyCODONE (Oxy IR/ROXICODONE) immediate release tablet 2.5-5 mg    [DISCONTINUED] PHENObarbital (LUMINAL) injection 65 mg    [DISCONTINUED] phenytoin (DILANTIN) injection 100 mg    [DISCONTINUED] prochlorperazine (COMPAZINE)  injection 10 mg    No facility-administered encounter medications on file as of 04/30/2022.    Review of Systems:  Review of Systems  Constitutional:  Positive for activity change. Negative for appetite change.  HENT: Negative.    Respiratory:  Negative for cough and shortness of breath.   Cardiovascular:  Negative for leg swelling.  Gastrointestinal:  Negative for constipation.  Genitourinary: Negative.   Musculoskeletal:  Positive for gait problem. Negative for arthralgias and myalgias.  Skin: Negative.   Neurological:  Positive for weakness. Negative for dizziness.  Psychiatric/Behavioral:  Positive for confusion. Negative for dysphoric mood and sleep disturbance.     Health Maintenance  Topic Date Due   DEXA SCAN  Never done   Pneumonia Vaccine 12+ Years old (2 - PPSV23 or PCV20) 11/26/2015   COVID-19 Vaccine (6 - Moderna risk series) 06/23/2021   INFLUENZA VACCINE  01/09/2022   Medicare Annual Wellness (AWV)  03/15/2022   Zoster Vaccines- Shingrix  Completed   HPV VACCINES  Aged Out    Physical Exam: There  were no vitals filed for this visit. There is no height or weight on file to calculate BMI. Physical Exam Vitals reviewed.  Constitutional:      Appearance: Normal appearance.  HENT:     Head: Normocephalic.     Nose: Nose normal.     Mouth/Throat:     Mouth: Mucous membranes are moist.     Pharynx: Oropharynx is clear.  Eyes:     Pupils: Pupils are equal, round, and reactive to light.  Cardiovascular:     Rate and Rhythm: Normal rate and regular rhythm.     Pulses: Normal pulses.     Heart sounds: Normal heart sounds. No murmur heard. Pulmonary:     Effort: Pulmonary effort is normal.     Breath sounds: Normal breath sounds.  Abdominal:     General: Abdomen is flat. Bowel sounds are normal.     Palpations: Abdomen is soft.  Musculoskeletal:        General: Swelling present.     Cervical back: Neck supple.     Comments: Mild swelling  Skin:    General: Skin is warm.  Neurological:     General: No focal deficit present.     Mental Status: She is alert.  Psychiatric:        Mood and Affect: Mood normal.        Thought Content: Thought content normal.     Labs reviewed: Basic Metabolic Panel: Recent Labs    01/27/22 1116 01/28/22 0331 04/22/22 1826 04/23/22 0811 04/24/22 0504 04/25/22 0735 04/26/22 0901 04/29/22 0436  NA  --    < >  --    < > 136 135 136  --   K  --    < >  --    < > 3.7 3.7 3.8  --   CL  --    < >  --    < > 103 103 105  --   CO2  --    < >  --    < > 22 19* 21*  --   GLUCOSE  --    < >  --    < > 91 89 98  --   BUN  --    < >  --    < > 29* 29* 27*  --   CREATININE  --    < >  --    < >  1.14* 1.32* 1.24* 1.07*  CALCIUM  --    < > 8.1*   < > 8.1* 8.4* 8.6*  --   MG 1.7  --  1.1*  --  1.8 1.9  --   --   PHOS  --   --   --   --  4.4 3.5  --   --   TSH 1.914  --   --   --   --  4.325  --   --    < > = values in this interval not displayed.   Liver Function Tests: Recent Labs    04/24/22 0504 04/25/22 0735 04/26/22 0901  AST '24 21 18   '$ ALT '16 15 12  '$ ALKPHOS 52 53 54  BILITOT 0.7 1.1 0.9  PROT 5.8* 5.9* 5.9*  ALBUMIN 2.8* 2.8* 2.6*   Recent Labs    10/15/21 1456 01/27/22 0830 04/22/22 0434  LIPASE 54* 53* 42   Recent Labs    04/26/22 0901  AMMONIA 18   CBC: Recent Labs    04/22/22 0434 04/23/22 0811 04/24/22 0504 04/25/22 0735 04/26/22 0901  WBC 13.3*   < > 11.3* 8.9 5.7  NEUTROABS 11.9*  --  10.0* 7.6  --   HGB 10.2*   < > 9.1* 9.5* 10.2*  HCT 31.7*   < > 28.1* 29.3* 32.4*  MCV 104.3*   < > 104.5* 103.5* 106.2*  PLT 288   < > 237 274 283   < > = values in this interval not displayed.   Lipid Panel: Recent Labs    01/28/22 1254  CHOL 208*  HDL 80  LDLCALC 116*  TRIG 59  CHOLHDL 2.6   Lab Results  Component Value Date   HGBA1C 4.9 04/22/2022    Procedures since last visit: Overnight EEG with video  Result Date: 04/27/2022 Lora Havens, MD     04/28/2022  8:46 AM Patient Name: [name] MRN: [id number] Epilepsy Attending: Lora Havens Referring Physician/Provider: Lorenza Chick, MD Duration: 04/26/2022 2330  to 04/27/2022 2330  Patient history: 86yo F with ams. EEG to evaluate for seizure  Level of alertness: Awake, asleep  AEDs during EEG study: LEV  Technical aspects: This EEG study was done with scalp electrodes positioned according to the 10-20 International system of electrode placement. Electrical activity was reviewed with band pass filter of 1-'70Hz'$ , sensitivity of 7 uV/mm, display speed of 85m/sec with a '60Hz'$  notched filter applied as appropriate. EEG data were recorded continuously and digitally stored.  Video monitoring was available and reviewed as appropriate.  Description: No clear posterior dominant rhythm was seen. Sleep was characterized by sleep spindles (12-'14hz'$ ), maximal fronto-central region. EEG showed continuous generalized 3 to 6 Hz theta-delta slowing. Generalized periodic discharges with triphasic morphology were noted at '1hz'$ , more prominent when  awake/stimulated. Hyperventilation and photic stimulation were not performed.   ABNORMALITY - Periodic discharges with triphasic morphology, generalized - Continuous slow, generalized  IMPRESSION: This study showed generalized periodic discharges with triphasic morphology which can be on the ictal-interictal continuum. However, the morphology, frequency and reactivity to stimulation is more commonly indicative of toxic-metabolic causes. Additionally there is moderate diffuse encephalopathy, nonspecific etiology. No seizures were seen throughout the recording.  PLora Havens  EEG adult  Result Date: 04/26/2022 YLora Havens MD     04/26/2022 11:57 AM Patient Name: Kelsey FAETHMRN: [id number] Attending: PLora HavensReferring Physician/Provider: GBarb Merino MD Date:04/26/2022 Duration: 23.22 mins  Patient history: 86yo F with ams. EEG to evaluate for seizure Level of alertness: Awake AEDs during EEG study: LEV Technical aspects: This EEG study was done with scalp electrodes positioned according to the 10-20 International system of electrode placement. Electrical activity was reviewed with band pass filter of 1-'70Hz'$ , sensitivity of 7 uV/mm, display speed of 24m/sec with a '60Hz'$  notched filter applied as appropriate. EEG data were recorded continuously and digitally stored.  Video monitoring was available and reviewed as appropriate. Description: EEG showed continuous generalized 3 to 6 Hz theta-delta slowing. Hyperventilation and photic stimulation were not performed.   Of note, study was technically difficult due to significant movement and myogenic artifact. ABNORMALITY - Continuous slow, generalized IMPRESSION: This technically difficult study is suggestive of moderate diffuse encephalopathy, nonspecific etiology.  No seizures or epileptiform discharges were seen throughout the recording. PLora Havens  MR BRAIN WO CONTRAST  Result Date: 04/25/2022 CLINICAL DATA:  Mental  status change.  Confusion. EXAM: MRI HEAD WITHOUT CONTRAST TECHNIQUE: Multiplanar, multiecho pulse sequences of the brain and surrounding structures were obtained without intravenous contrast. COMPARISON:  CT head 04/25/2022 FINDINGS: Brain: Large lesion left frontal lobe measuring approximately 4.7 x 5.1 cm. This is calcified on CT with multiple flow voids and dilated vessels on MRI. There is a large draining superficial cortical vein. The left anterior cerebral artery is enlarged and appears to be the primary supply to the presumed AVM. Enlarged veins also present within the left lateral ventricle. No evidence of prior hemorrhage. Negative for hydrocephalus. Generalized atrophy. Negative for acute infarct. Vascular: Flow voids are present in the circle-of-Willis. 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 There is also a possible aneurysm of the anterior communicating artery. Large AVM left frontal lobe as described above. Skull and upper cervical spine: No focal skeletal abnormality. Sinuses/Orbits: Mucosal edema left maxillary sinus. Manning sinuses clear. Bilateral cataract extraction Other: None IMPRESSION: 1. Large AVM left frontal lobe measuring 4.7 x 5.1 cm. There is a large draining superficial cortical vein. The left anterior cerebral artery is enlarged and appears to be the primary supply to the AVM. No evidence of prior hemorrhage. 2. 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 3. Atrophy. No acute infarct. Electronically Signed   By: CFranchot GalloM.D.   On: 04/25/2022 14:32   CT HEAD WO CONTRAST (5MM)  Result Date: 04/25/2022 CLINICAL DATA:  Mental status change. EXAM: CT HEAD WITHOUT CONTRAST TECHNIQUE: Contiguous axial images were obtained from the base of the skull through the vertex without intravenous contrast. RADIATION DOSE REDUCTION: This exam was performed according to the departmental  dose-optimization program which includes automated exposure control, adjustment of the mA and/or kV according to patient size and/or use of iterative reconstruction technique. COMPARISON:  Head CT 10/15/2021 FINDINGS: Brain: Stable large partially calcified left frontal lobe process likely chronic AVM. No surrounding edema to suggest infarction or inflammation. The ventricles are stable in size and configuration and in the midline without mass effect or shift. No extra-axial fluid collections are identified. No CT findings for acute hemispheric infarction or intracranial hemorrhage. No mass lesions. The brainstem and cerebellum are grossly normal. Vascular: Stable vascular calcifications.  No hyperdense vessels. Skull: No skull fracture or bone lesions. Sinuses/Orbits: The paranasal sinuses and mastoid air cells are clear except for fluid in the left maxillary sinus. The globes are intact. Other: Extensive calcification noted around the annular ligament at C1-2 and there  is extensive calcification involving the temporomandibular joints. Findings suggest CPPD arthropathy. IMPRESSION: 1. Stable large partially calcified left frontal lobe process likely chronic AVM. 2. No acute intracranial findings. 3. Fluid in the left maxillary sinus. 4. Chronic changes of CPPD arthropathy. Electronically Signed   By: Marijo Sanes M.D.   On: 04/25/2022 11:46   US Abdomen Limited RUQ (LIVER/GB)  Result Date: 04/22/2022 CLINICAL DATA:  Pain right upper quadrant EXAM: ULTRASOUND ABDOMEN LIMITED RIGHT UPPER QUADRANT COMPARISON:  CT done earlier today FINDINGS: Gallbladder: There is 2 cm calculus in the lumen of gallbladder. There are possible small polyps in the margin of lumen. There is wall thickening in gallbladder measuring up to 11 mm. There is trace amount of pericholecystic fluid. Technologist did not observe any tenderness over the gallbladder during the study. Common bile duct: Diameter: 5 mm Liver: There is nodularity in  liver surface. There is slightly increased echogenicity in liver. There is small amount of perihepatic ascites. Portal vein is patent on color Doppler imaging with normal direction of blood flow towards the liver. Other: Right pleural effusion is seen. IMPRESSION: Large gallbladder stone and possible small gallbladder polyps. There is diffuse wall thickening in gallbladder which may be due to acute or chronic cholecystitis. Technologist did not observe any tenderness over the gallbladder during the study. Please correlate with clinical physical examination findings and laboratory findings. There is mild nodularity in liver surface suggesting possible cirrhosis. Small perihepatic ascites. Right pleural effusion. Electronically Signed   By: Elmer Picker M.D.   On: 04/22/2022 08:54   CT ABDOMEN PELVIS W CONTRAST  Addendum Date: 04/22/2022   ADDENDUM REPORT: 04/22/2022 07:16 ADDENDUM: As mentioned in the body of the report there is a 2.3 x 1.5 cm left adrenal nodule which is indeterminate. Attention at time of forthcoming abdominal MRI is recommended for further characterization. Electronically Signed   By: Vinnie Langton M.D.   On: 04/22/2022 07:16   Result Date: 04/22/2022 CLINICAL DATA:  86 year old female with history of right-sided abdominal tenderness and nausea. EXAM: CT ABDOMEN AND PELVIS WITH CONTRAST TECHNIQUE: Multidetector CT imaging of the abdomen and pelvis was performed using the standard protocol following bolus administration of intravenous contrast. RADIATION DOSE REDUCTION: This exam was performed according to the departmental dose-optimization program which includes automated exposure control, adjustment of the mA and/or kV according to patient size and/or use of iterative reconstruction technique. CONTRAST:  166m OMNIPAQUE IOHEXOL 300 MG/ML  SOLN COMPARISON:  CT of the abdomen and pelvis 04/17/2021. FINDINGS: Lower chest: Bilateral pleural effusions with extensive passive  subsegmental atelectasis in the visualized lung bases. Cardiomegaly. Pacemaker leads noted in the right-side of the heart. Hepatobiliary: No suspicious cystic or solid hepatic lesions. No intra or extrahepatic biliary ductal dilatation. 2.4 x 1.5 cm calcified gallstone lying dependently in the gallbladder. Gallbladder is moderately distended. Gallbladder wall is thickened diffusely with heterogeneous areas of enhancement. Trace volume of pericholecystic fluid. Pancreas: No pancreatic mass. No pancreatic ductal dilatation. No pancreatic or peripancreatic fluid collections or inflammatory changes. Spleen: Subcentimeter hypovascular lesion in the anterior aspect of the spleen, too small to characterize, but similar to the prior study from 2022, presumably benign (no imaging follow-up recommended). Adrenals/Urinary Tract: Low-attenuation lesions in both kidneys, compatible with simple cysts, largest of which is exophytic in the posteromedial aspect of the upper pole of the left kidney measuring 2.7 cm in diameter (no imaging follow-up recommended). Multifocal cortical thinning in both kidneys. No suspicious renal lesions. No hydroureteronephrosis. Urinary bladder is  normal in appearance. Right adrenal gland is normal in appearance. 2.3 x 1.5 cm left adrenal nodule (axial image 22 of series 2). Stomach/Bowel: The stomach is nearly completely decompressed, but otherwise unremarkable in appearance. No pathologic dilatation of small bowel or colon. Normal appendix. Vascular/Lymphatic: Atherosclerosis throughout the abdominal aorta and pelvic vasculature, without evidence of aneurysm or dissection in the abdominal or pelvic vasculature. No lymphadenopathy noted in the abdomen or pelvis. Reproductive: Uterus and ovaries are atrophic. Other: Supraumbilical ventral hernia containing some omental fat and a small volume of ascites. No pneumoperitoneum. Small volume of ascites. Musculoskeletal: There are no aggressive appearing  lytic or blastic lesions noted in the visualized portions of the skeleton. IMPRESSION: 1. Gallbladder is distended with extensive mucosal hyperenhancement, mural thickening and trace amount of pericholecystic fluid. There is a large calcified gallstone within the lumen of the gallbladder, however, this does not appear to be in an obstructive position at this time. Overall, findings are clearly abnormal and concerning for potential acute cholecystitis, but correlation with abdominal MRI with and without IV gadolinium with MRCP is recommended to better evaluate these findings and assess for potential choledocholithiasis. Surgical consultation is also recommended. 2. Small volume of ascites. 3. Supraumbilical ventral hernia containing omental fat and small volume of ascites. No definite bowel incarceration or obstruction at this time. 4. Bilateral pleural effusions with passive areas of subsegmental atelectasis in the visualized lung bases. 5. Cardiomegaly. 6. Aortic atherosclerosis. Electronically Signed: By: Vinnie Langton M.D. On: 04/22/2022 07:09    Assessment/Plan 1. Partial symptomatic epilepsy with complex partial seizures, not intractable, without status epilepticus (HCC) Continue Dilantin and Pheno barb her home dose  2. S/P laparoscopic cholecystectomy Doing well No Nausea or abdominal pain Appetite is improving   3. Confusion Mild Confusion about her Hospital stay Will work with therapy here  4. Essential hypertension Mildiy Elevated Discontinuing her lasix as she looks dry Continue on Hydralazine and Diovan Can increase Hydralazine to TID  5. Bilateral leg edema No Edema Discontinue Lasix Cna use as PRN  6. Paroxysmal A-fib (HCC) Off amiodarone due to side effects No Anticoagulation due to AVMS  7. Mixed hyperlipidemia No Meds  8. Iron deficiency anemia, unspecified iron deficiency anemia type On  Iron  9. Weakness Will work with therapy   10. Diarrhea, unspecified  type Resolved for now Uses Imodium PRN  11. Hypomagnesemia Needs Supplements Repeat Levels  12. S/P placement of cardiac pacemaker     Labs/tests ordered:  CBC,CMP MG level in 1 week Next appt:  Visit date not found

## 2022-04-30 NOTE — Progress Notes (Signed)
Physical Therapy Treatment Patient Details Name: [name] MRN: [id number] DOB: [birth year] Today's Date: 04/30/2022   History of Present Illness Pt is an 86yo female presenting s/p laparoscopic cholecystectomy on 04/23/22. Pt resides at [facility]. Presenting with post-anesthesia change in cognitive status. PMH: afib with pacemaker, HTN, epilepsy, osteopenia.    PT Comments    Pt progressing towards acute goals, awake and alert throughout session and able to generate full sentences and follow one step commands with increased time. Pt able to come to sitting EOB with min assist and main static sitting throughout session. Pt able to progress sit<>stand transfers this session with pt able to elevate hips from EOB x3 trials with mod assist, however unable to achieve full upright standing. Current plan remains appropriate to address deficits and maximize functional independence and decrease caregiver burden. Pt continues to benefit from skilled PT services to progress toward functional mobility goals.    Recommendations for follow up therapy are one component of a multi-disciplinary discharge planning process, led by the attending physician.  Recommendations may be updated based on patient status, additional functional criteria and insurance authorization.  Follow Up Recommendations  Skilled nursing-short term rehab (<3 hours/day) Can patient physically be transported by private vehicle: No   Assistance Recommended at Discharge Frequent or constant Supervision/Assistance  Patient can return home with the following Two people to help with walking and/or transfers;Two people to help with bathing/dressing/bathroom;Assistance with cooking/housework;Assistance with feeding;Direct supervision/assist for medications management;Direct supervision/assist for financial management;Assist for transportation;Help with stairs or ramp for entrance   Equipment Recommendations  None recommended by PT  (TBD)    Recommendations for Other Services       Precautions / Restrictions Precautions Precautions: Fall Restrictions Weight Bearing Restrictions: No     Mobility  Bed Mobility Overal bed mobility: Needs Assistance  Bed Mobility: Supine to Sit, Sit to Supine     Supine to sit: Min assist Sit to supine: Mod assist   General bed mobility comments: pt following commands with increased time, asssit to elevate trunk, incrased asssit to return to supine and bring BLEs back into bed    Transfers Overall transfer level: Needs assistance Equipment used: Rolling walker (2 wheels) Transfers: Sit to/from Stand, Bed to chair/wheelchair/BSC Sit to Stand: Mod assist, Max assist          Lateral/Scoot Transfers: Min assist (along EOB) General transfer comment: max down to mod assist with attempts, pt able to clear hips from bed x3 with mod assist, unable to come to full upright standing, able to laterally scoot along EOB with min assist to [facility]    Ambulation/Gait               General Gait Details: unable   Stairs             Wheelchair Mobility    Modified Rankin (Stroke Patients Only)       Balance Overall balance assessment: Needs assistance Sitting-balance support: No upper extremity supported, Feet supported Sitting balance-Leahy Scale: Fair Sitting balance - Comments: pt able to maintain sitting balance at EOB throughout session   Standing balance support: Bilateral upper extremity supported Standing balance-Leahy Scale: Zero                              Cognition Arousal/Alertness: Awake/alert Behavior During Therapy: WFL for tasks assessed/performed Overall Cognitive Status: Impaired/Different from baseline Area of Impairment: Orientation, Attention, Memory, Safety/judgement, Following commands  Orientation Level: Disoriented to, Time, Situation Current Attention Level: Focused   Following Commands: Follows  one step commands with increased time Safety/Judgement: Decreased awareness of deficits, Decreased awareness of safety Awareness: Intellectual   General Comments: pt more communicative this date, perseverating on where daughter is, able to generate full sentences "can i have some water?" "my back itches" "i need to get dressed"        Exercises      General Comments        Pertinent Vitals/Pain Pain Assessment Pain Assessment: Faces Faces Pain Scale: Hurts a little bit Pain Location: BLEs iwht initiation of mobility Pain Descriptors / Indicators: Moaning Pain Intervention(s): Monitored during session, Limited activity within patient's tolerance    Home Living                          Prior Function            PT Goals (current goals can now be found in the care plan section) Acute Rehab PT Goals Patient Stated Goal: none stated PT Goal Formulation: With family Time For Goal Achievement: 05/09/22    Frequency    Min 2X/week      PT Plan      Co-evaluation              AM-PAC PT "6 Clicks" Mobility   Outcome Measure  Help needed turning from your back to your side while in a flat bed without using bedrails?: Total Help needed moving from lying on your back to sitting on the side of a flat bed without using bedrails?: A Lot Help needed moving to and from a bed to a chair (including a wheelchair)?: Total Help needed standing up from a chair using your arms (e.g., wheelchair or bedside chair)?: Total Help needed to walk in hospital room?: Total Help needed climbing 3-5 steps with a railing? : Total 6 Click Score: 7    End of Session   Activity Tolerance: Patient tolerated treatment well Patient left: in bed;with call bell/phone within reach;with bed alarm set Nurse Communication: Mobility status PT Visit Diagnosis: Other symptoms and signs involving the nervous system (R29.898);Muscle weakness (generalized) (M62.81);Other abnormalities of  gait and mobility (R26.89)     Time: 8832-5498 PT Time Calculation (min) (ACUTE ONLY): 23 min  Charges:  $Therapeutic Activity: 23-37 mins                    Marsalis Beaulieu R. PTA Acute Rehabilitation Services Office: [place] 04/30/2022, 9:31 AM

## 2022-04-30 NOTE — Discharge Summary (Signed)
Physician Discharge Summary  [name] MVH:[id number] DOB: [birth year] DOA: 04/22/2022  PCP: Virgie Dad, MD  Admit date: 04/22/2022 Discharge date: 04/30/2022  Admitted From: Assisted living; rehab Disposition: Same  Recommendations for Outpatient Follow-up:  Follow up with PCP in 1-2 weeks Follow-up with surgery as scheduled for postoperative care  Discharge Condition: Stable CODE STATUS: DNR Diet recommendation: Regular diet as tolerated  Brief/Interim Summary: 86-year-old from assisted living facility with history of permanent A-fib status post pacemaker, hypertension, complex partial epilepsy since childhood presented with 1 days of right upper quadrant abdominal pain and she was found to have calculus cholecystitis.  She was admitted to the hospital with IV fluids, antibiotics.  Underwent laparoscopic cholecystectomy with grossly infected gallbladder.  Good clinical recovery.  11/15.  After appropriate recovery from surgery, she started developing word finding difficulties and confusion.  Symptoms significantly worse overnight.  No focal deficits.  Persistently encephalopathic.  Patient likely had postoperative seizure in the setting of stress anesthesia analgesics.  Now resolved back to baseline.  Neurology consulted, recommended to continue patient's home antiepileptic medications.  Otherwise patient tolerated surgical procedure quite well, follow outpatient surgery as indicated otherwise advance diet as tolerated.  Discharge Diagnoses:  Principal Problem:   Acute cholecystitis Active Problems:   History of complex partial epilepsy   Essential hypertension   Macrocytic anemia   Heart block AV complete (HCC)   Paroxysmal atrial fibrillation (HCC)   Hyperglycemia   Hypocalcemia   Cholecystitis with cholelithiasis   Acute metabolic encephalopathy, confusion and word finding difficulty, not present on admission.   Rule out breakthrough vs provoked seizure Likely  concurrent hospital delirium - Neurology following - appreciate insight/recs - EEG continue to be abnormal but no overt seizures noted on tracing - Likely decreased seizure threshold due to surgical stress, missed medications and subtherapeutic levels with very large left frontal lobe mass. - Labs unremarkable - Imaging (CT/MRI) show stable AV malformation in L frontal lobe 5x5 cm but likely no relation to above symptoms -Continue phenobarbital and Dilantin. Dilantin level now therapeutic, Keppra discontinued per neurology -Mental status improving drastically over the last 24 hours, otherwise stable and agreeable for discharge   Cholecystitis with cholelithiasis:  Lap chole 84/13, without complications, continue to advance diet as tolerated - currently full liquid diet.  Gen Sx signed off - follow up outpatient as scheduled. Daughter/son aware.   Hypomagnesemia: Repleted, follow repeat labs outpatient with PCP.   Essential hypertension Hyperlipidemia  - Continue home medications, no changes  Discharge Instructions  Discharge Instructions     Call MD for:  severe uncontrolled pain   Complete by: As directed    Diet - low sodium heart healthy   Complete by: As directed    Increase activity slowly   Complete by: As directed       Allergies as of 04/30/2022       Reactions   Aleve [naproxen] Other (See Comments)   Azithromycin Other (See Comments)   Reaction not recalled   Penicillins Hives, Other (See Comments)   Bad reaction as a child after a shot of Penicillin   Pravastatin Other (See Comments)   Reaction not recalled   Sulfa Antibiotics Other (See Comments)   Sulfa Drugs Cross Reactors Other (See Comments)   Reaction not recalled- stated she can take it "in small doses"   Tape Itching, Other (See Comments)   Cannot wear for an extended period of time   Naproxen Sodium Rash  Medication List     STOP taking these medications    docusate sodium 100 MG  capsule Commonly known as: COLACE   nystatin powder Generic drug: nystatin   saccharomyces boulardii 250 MG capsule Commonly known as: FLORASTOR       TAKE these medications    acetaminophen 500 MG tablet Commonly known as: TYLENOL Take 500 mg by mouth every 6 (six) hours as needed for mild pain or headache.   Align 4 MG Caps Take 4 mg by mouth daily.   fluticasone 50 MCG/ACT nasal spray Commonly known as: FLONASE Place 1-2 sprays into both nostrils daily.   furosemide 20 MG tablet Commonly known as: LASIX Take 20 mg by mouth 3 (three) times a week. Monday, Wednesday, Friday for 2 weeks starting on 04/18/22   hydrALAZINE 25 MG tablet Commonly known as: APRESOLINE Take 25 mg by mouth in the morning and at bedtime. What changed: Another medication with the same name was removed. Continue taking this medication, and follow the directions you see here.   loperamide 2 MG tablet Commonly known as: IMODIUM A-D Take 1 tablet (2 mg total) by mouth as needed for diarrhea or loose stools. What changed: when to take this   magnesium oxide 400 (240 Mg) MG tablet Commonly known as: MAG-OX Take 1 tablet (400 mg total) by mouth daily.   Multi Complete/Iron Tabs Take 1 tablet by mouth daily with breakfast. Folic acid included   NUTRITIONAL DRINK PO Take 1 Bottle by mouth daily. Chocolate Boost or Ensure with lunch   ondansetron 4 MG disintegrating tablet Commonly known as: ZOFRAN-ODT Take 4 mg by mouth every 4 (four) hours as needed for nausea or vomiting.   PHENobarbital 64.8 MG tablet Commonly known as: LUMINAL Take 1 tablet (64.8 mg total) by mouth in the morning.   phenytoin 100 MG ER capsule Commonly known as: DILANTIN Take 200 mg by mouth daily with breakfast.   potassium chloride SA 20 MEQ tablet Commonly known as: KLOR-CON M Take 20 mEq by mouth 3 (three) times a week. Monday, Wednesday, Friday for 2 weeks starting on 04/18/22   Robafen DM 10-100 MG/5ML  liquid Generic drug: Dextromethorphan-guaiFENesin Take 10 mLs by mouth every 6 (six) hours as needed (cough/congestion). What changed: Another medication with the same name was removed. Continue taking this medication, and follow the directions you see here.   Systane Ultra 0.4-0.3 % Soln Generic drug: Polyethyl Glycol-Propyl Glycol Place 1 drop into both eyes in the morning and at bedtime.   valsartan 160 MG tablet Commonly known as: Diovan Take 1 tablet (160 mg total) by mouth daily.   Vitamin-B Complex Tabs Take 1 tablet by mouth daily.       ASK your doctor about these medications    cefdinir 300 MG capsule Commonly known as: OMNICEF Take 1 capsule (300 mg total) by mouth 2 (two) times daily for 5 days. Ask about: Should I take this medication?   metroNIDAZOLE 500 MG tablet Commonly known as: Flagyl Take 1 tablet (500 mg total) by mouth 2 (two) times daily for 5 days. Ask about: Should I take this medication?   oxyCODONE 5 MG immediate release tablet Commonly known as: Oxy IR/ROXICODONE Take 1 tablet (5 mg total) by mouth every 6 (six) hours as needed for up to 3 days for moderate pain or severe pain. Ask about: Should I take this medication?        Follow-up Information     Maczis, Carlena Hurl, [place].  Go on 05/15/2022.   Specialty: General Surgery Why: 9:45 AM. Please arrive 30 min prior to appointment time to check in. Bring photo ID and insurance information with you. Contact information: [address] [place]  [address] [phone]                Allergies  Allergen Reactions   Aleve [Naproxen] Other (See Comments)   Azithromycin Other (See Comments)    Reaction not recalled   Penicillins Hives and Other (See Comments)    Bad reaction as a child after a shot of Penicillin   Pravastatin Other (See Comments)    Reaction not recalled   Sulfa Antibiotics Other (See Comments)   Sulfa Drugs Cross Reactors Other (See Comments)     Reaction not recalled- stated she can take it "in small doses"   Tape Itching and Other (See Comments)    Cannot wear for an extended period of time   Naproxen Sodium Rash    Consultations: Neuro  Procedures/Studies: Overnight EEG with video  Result Date: 04/27/2022 Lora Havens, MD     04/28/2022  8:46 AM Patient Name: [name] MRN: [id number] Epilepsy Attending: Lora Havens Referring Physician/Provider: Lorenza Chick, MD Duration: 04/26/2022 2330  to 04/27/2022 2330  Patient history: 86yo F with ams. EEG to evaluate for seizure  Level of alertness: Awake, asleep  AEDs during EEG study: LEV  Technical aspects: This EEG study was done with scalp electrodes positioned according to the 10-20 International system of electrode placement. Electrical activity was reviewed with band pass filter of 1-'70Hz'$ , sensitivity of 7 uV/mm, display speed of 59m/sec with a '60Hz'$  notched filter applied as appropriate. EEG data were recorded continuously and digitally stored.  Video monitoring was available and reviewed as appropriate.  Description: No clear posterior dominant rhythm was seen. Sleep was characterized by sleep spindles (12-'14hz'$ ), maximal fronto-central region. EEG showed continuous generalized 3 to 6 Hz theta-delta slowing. Generalized periodic discharges with triphasic morphology were noted at '1hz'$ , more prominent when awake/stimulated. Hyperventilation and photic stimulation were not performed.   ABNORMALITY - Periodic discharges with triphasic morphology, generalized - Continuous slow, generalized  IMPRESSION: This study showed generalized periodic discharges with triphasic morphology which can be on the ictal-interictal continuum. However, the morphology, frequency and reactivity to stimulation is more commonly indicative of toxic-metabolic causes. Additionally there is moderate diffuse encephalopathy, nonspecific etiology. No seizures were seen throughout the recording.  PLora Havens  EEG adult  Result Date: 04/26/2022 YLora Havens MD     04/26/2022 11:57 AM Patient Name: Kelsey JACOTMRN: [id number] Attending: PLora HavensReferring Physician/Provider: GBarb Merino MD Date:04/26/2022 Duration: 23.22 mins Patient history: 89yoF with ams. EEG to evaluate for seizure Level of alertness: Awake AEDs during EEG study: LEV Technical aspects: This EEG study was done with scalp electrodes positioned according to the 10-20 International system of electrode placement. Electrical activity was reviewed with band pass filter of 1-'70Hz'$ , sensitivity of 7 uV/mm, display speed of 361msec with a '60Hz'$  notched filter applied as appropriate. EEG data were recorded continuously and digitally stored.  Video monitoring was available and reviewed as appropriate. Description: EEG showed continuous generalized 3 to 6 Hz theta-delta slowing. Hyperventilation and photic stimulation were not performed.   Of note, study was technically difficult due to significant movement and myogenic artifact. ABNORMALITY - Continuous slow, generalized IMPRESSION: This technically difficult study is suggestive of moderate diffuse encephalopathy, nonspecific etiology.  No seizures or epileptiform discharges were seen throughout the recording. Lora Havens   MR BRAIN WO CONTRAST  Result Date: 04/25/2022 CLINICAL DATA:  Mental status change.  Confusion. EXAM: MRI HEAD WITHOUT CONTRAST TECHNIQUE: Multiplanar, multiecho pulse sequences of the brain and surrounding structures were obtained without intravenous contrast. COMPARISON:  CT head 04/25/2022 FINDINGS: Brain: Large lesion left frontal lobe measuring approximately 4.7 x 5.1 cm. This is calcified on CT with multiple flow voids and dilated vessels on MRI. There is a large draining superficial cortical vein. The left anterior cerebral artery is enlarged and appears to be the primary supply to the presumed AVM. Enlarged veins also present within  the left lateral ventricle. No evidence of prior hemorrhage. Negative for hydrocephalus. Generalized atrophy. Negative for acute infarct. Vascular: Flow voids are present in the circle-of-Willis. 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 There is also a possible aneurysm of the anterior communicating artery. Large AVM left frontal lobe as described above. Skull and upper cervical spine: No focal skeletal abnormality. Sinuses/Orbits: Mucosal edema left maxillary sinus. Manning sinuses clear. Bilateral cataract extraction Other: None IMPRESSION: 1. Large AVM left frontal lobe measuring 4.7 x 5.1 cm. There is a large draining superficial cortical vein. The left anterior cerebral artery is enlarged and appears to be the primary supply to the AVM. No evidence of prior hemorrhage. 2. 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 3. Atrophy. No acute infarct. Electronically Signed   By: Franchot Gallo M.D.   On: 04/25/2022 14:32   CT HEAD WO CONTRAST (5MM)  Result Date: 04/25/2022 CLINICAL DATA:  Mental status change. EXAM: CT HEAD WITHOUT CONTRAST TECHNIQUE: Contiguous axial images were obtained from the base of the skull through the vertex without intravenous contrast. RADIATION DOSE REDUCTION: This exam was performed according to the departmental dose-optimization program which includes automated exposure control, adjustment of the mA and/or kV according to patient size and/or use of iterative reconstruction technique. COMPARISON:  Head CT 10/15/2021 FINDINGS: Brain: Stable large partially calcified left frontal lobe process likely chronic AVM. No surrounding edema to suggest infarction or inflammation. The ventricles are stable in size and configuration and in the midline without mass effect or shift. No extra-axial fluid collections are identified. No CT findings for acute hemispheric infarction or intracranial hemorrhage.  No mass lesions. The brainstem and cerebellum are grossly normal. Vascular: Stable vascular calcifications.  No hyperdense vessels. Skull: No skull fracture or bone lesions. Sinuses/Orbits: The paranasal sinuses and mastoid air cells are clear except for fluid in the left maxillary sinus. The globes are intact. Other: Extensive calcification noted around the annular ligament at C1-2 and there is extensive calcification involving the temporomandibular joints. Findings suggest CPPD arthropathy. IMPRESSION: 1. Stable large partially calcified left frontal lobe process likely chronic AVM. 2. No acute intracranial findings. 3. Fluid in the left maxillary sinus. 4. Chronic changes of CPPD arthropathy. Electronically Signed   By: Marijo Sanes M.D.   On: 04/25/2022 11:46   US Abdomen Limited RUQ (LIVER/GB)  Result Date: 04/22/2022 CLINICAL DATA:  Pain right upper quadrant EXAM: ULTRASOUND ABDOMEN LIMITED RIGHT UPPER QUADRANT COMPARISON:  CT done earlier today FINDINGS: Gallbladder: There is 2 cm calculus in the lumen of gallbladder. There are possible small polyps in the margin of lumen. There is wall thickening in gallbladder measuring up to 11 mm. There is trace amount of pericholecystic fluid. Technologist did not observe any tenderness over the  gallbladder during the study. Common bile duct: Diameter: 5 mm Liver: There is nodularity in liver surface. There is slightly increased echogenicity in liver. There is small amount of perihepatic ascites. Portal vein is patent on color Doppler imaging with normal direction of blood flow towards the liver. Other: Right pleural effusion is seen. IMPRESSION: Large gallbladder stone and possible small gallbladder polyps. There is diffuse wall thickening in gallbladder which may be due to acute or chronic cholecystitis. Technologist did not observe any tenderness over the gallbladder during the study. Please correlate with clinical physical examination findings and laboratory  findings. There is mild nodularity in liver surface suggesting possible cirrhosis. Small perihepatic ascites. Right pleural effusion. Electronically Signed   By: Elmer Picker M.D.   On: 04/22/2022 08:54   CT ABDOMEN PELVIS W CONTRAST  Addendum Date: 04/22/2022   ADDENDUM REPORT: 04/22/2022 07:16 ADDENDUM: As mentioned in the body of the report there is a 2.3 x 1.5 cm left adrenal nodule which is indeterminate. Attention at time of forthcoming abdominal MRI is recommended for further characterization. Electronically Signed   By: Vinnie Langton M.D.   On: 04/22/2022 07:16   Result Date: 04/22/2022 CLINICAL DATA:  86 year old female with history of right-sided abdominal tenderness and nausea. EXAM: CT ABDOMEN AND PELVIS WITH CONTRAST TECHNIQUE: Multidetector CT imaging of the abdomen and pelvis was performed using the standard protocol following bolus administration of intravenous contrast. RADIATION DOSE REDUCTION: This exam was performed according to the departmental dose-optimization program which includes automated exposure control, adjustment of the mA and/or kV according to patient size and/or use of iterative reconstruction technique. CONTRAST:  168m OMNIPAQUE IOHEXOL 300 MG/ML  SOLN COMPARISON:  CT of the abdomen and pelvis 04/17/2021. FINDINGS: Lower chest: Bilateral pleural effusions with extensive passive subsegmental atelectasis in the visualized lung bases. Cardiomegaly. Pacemaker leads noted in the right-side of the heart. Hepatobiliary: No suspicious cystic or solid hepatic lesions. No intra or extrahepatic biliary ductal dilatation. 2.4 x 1.5 cm calcified gallstone lying dependently in the gallbladder. Gallbladder is moderately distended. Gallbladder wall is thickened diffusely with heterogeneous areas of enhancement. Trace volume of pericholecystic fluid. Pancreas: No pancreatic mass. No pancreatic ductal dilatation. No pancreatic or peripancreatic fluid collections or inflammatory  changes. Spleen: Subcentimeter hypovascular lesion in the anterior aspect of the spleen, too small to characterize, but similar to the prior study from 2022, presumably benign (no imaging follow-up recommended). Adrenals/Urinary Tract: Low-attenuation lesions in both kidneys, compatible with simple cysts, largest of which is exophytic in the posteromedial aspect of the upper pole of the left kidney measuring 2.7 cm in diameter (no imaging follow-up recommended). Multifocal cortical thinning in both kidneys. No suspicious renal lesions. No hydroureteronephrosis. Urinary bladder is normal in appearance. Right adrenal gland is normal in appearance. 2.3 x 1.5 cm left adrenal nodule (axial image 22 of series 2). Stomach/Bowel: The stomach is nearly completely decompressed, but otherwise unremarkable in appearance. No pathologic dilatation of small bowel or colon. Normal appendix. Vascular/Lymphatic: Atherosclerosis throughout the abdominal aorta and pelvic vasculature, without evidence of aneurysm or dissection in the abdominal or pelvic vasculature. No lymphadenopathy noted in the abdomen or pelvis. Reproductive: Uterus and ovaries are atrophic. Other: Supraumbilical ventral hernia containing some omental fat and a small volume of ascites. No pneumoperitoneum. Small volume of ascites. Musculoskeletal: There are no aggressive appearing lytic or blastic lesions noted in the visualized portions of the skeleton. IMPRESSION: 1. Gallbladder is distended with extensive mucosal hyperenhancement, mural thickening and trace amount of pericholecystic fluid.  There is a large calcified gallstone within the lumen of the gallbladder, however, this does not appear to be in an obstructive position at this time. Overall, findings are clearly abnormal and concerning for potential acute cholecystitis, but correlation with abdominal MRI with and without IV gadolinium with MRCP is recommended to better evaluate these findings and assess for  potential choledocholithiasis. Surgical consultation is also recommended. 2. Small volume of ascites. 3. Supraumbilical ventral hernia containing omental fat and small volume of ascites. No definite bowel incarceration or obstruction at this time. 4. Bilateral pleural effusions with passive areas of subsegmental atelectasis in the visualized lung bases. 5. Cardiomegaly. 6. Aortic atherosclerosis. Electronically Signed: By: Vinnie Langton M.D. On: 04/22/2022 07:09     Subjective: No acute issues or events overnight denies nausea vomiting diarrhea constipation headache fevers chills or chest pain   Discharge Exam: Vitals:   04/30/22 0756 04/30/22 1139  BP: (!) 156/42 (!) 162/44  Pulse: (!) 59 (!) 59  Resp: 20 18  Temp: 98.1 F (36.7 C) 98.1 F (36.7 C)  SpO2: 97% 97%   Vitals:   04/30/22 0338 04/30/22 0650 04/30/22 0756 04/30/22 1139  BP: (!) 160/41 (!) 154/49 (!) 156/42 (!) 162/44  Pulse: 62 66 (!) 59 (!) 59  Resp: '16 19 20 18  '$ Temp: 98.3 F (36.8 C) 97.6 F (36.4 C) 98.1 F (36.7 C) 98.1 F (36.7 C)  TempSrc: Oral Oral Oral Oral  SpO2: 98% 95% 97% 97%  Weight:      Height:        General: Pt is alert, awake, not in acute distress Cardiovascular: RRR, S1/S2 +, no rubs, no gallops Respiratory: CTA bilaterally, no wheezing, no rhonchi Abdominal: Soft, NT, ND, bowel sounds + Extremities: no edema, no cyanosis    The results of significant diagnostics from this hospitalization (including imaging, microbiology, ancillary and laboratory) are listed below for reference.     Microbiology: No results found for this or any previous visit (from the past 240 hour(s)).   Labs: BNP (last 3 results) No results for input(s): "BNP" in the last 8760 hours. Basic Metabolic Panel: Recent Labs  Lab 04/24/22 0504 04/25/22 0735 04/26/22 0901 04/29/22 0436  NA 136 135 136  --   K 3.7 3.7 3.8  --   CL 103 103 105  --   CO2 22 19* 21*  --   GLUCOSE 91 89 98  --   BUN 29* 29* 27*   --   CREATININE 1.14* 1.32* 1.24* 1.07*  CALCIUM 8.1* 8.4* 8.6*  --   MG 1.8 1.9  --   --   PHOS 4.4 3.5  --   --    Liver Function Tests: Recent Labs  Lab 04/24/22 0504 04/25/22 0735 04/26/22 0901  AST '24 21 18  '$ ALT '16 15 12  '$ ALKPHOS 52 53 54  BILITOT 0.7 1.1 0.9  PROT 5.8* 5.9* 5.9*  ALBUMIN 2.8* 2.8* 2.6*   No results for input(s): "LIPASE", "AMYLASE" in the last 168 hours. Recent Labs  Lab 04/26/22 0901  AMMONIA 18   CBC: Recent Labs  Lab 04/24/22 0504 04/25/22 0735 04/26/22 0901  WBC 11.3* 8.9 5.7  NEUTROABS 10.0* 7.6  --   HGB 9.1* 9.5* 10.2*  HCT 28.1* 29.3* 32.4*  MCV 104.5* 103.5* 106.2*  PLT 237 274 283   Cardiac Enzymes: No results for input(s): "CKTOTAL", "CKMB", "CKMBINDEX", "TROPONINI" in the last 168 hours. BNP: Invalid input(s): "POCBNP" CBG: Recent Labs  Lab 04/23/22 2037  GLUCAP 122*   D-Dimer No results for input(s): "DDIMER" in the last 72 hours. Hgb A1c No results for input(s): "HGBA1C" in the last 72 hours. Lipid Profile No results for input(s): "CHOL", "HDL", "LDLCALC", "TRIG", "CHOLHDL", "LDLDIRECT" in the last 72 hours. Thyroid function studies No results for input(s): "TSH", "T4TOTAL", "T3FREE", "THYROIDAB" in the last 72 hours.  Invalid input(s): "FREET3" Anemia work up No results for input(s): "VITAMINB12", "FOLATE", "FERRITIN", "TIBC", "IRON", "RETICCTPCT" in the last 72 hours. Urinalysis    Component Value Date/Time   COLORURINE AMBER (A) 04/25/2022 0853   APPEARANCEUR CLEAR 04/25/2022 0853   LABSPEC 1.029 04/25/2022 0853   PHURINE 5.0 04/25/2022 0853   GLUCOSEU NEGATIVE 04/25/2022 0853   HGBUR NEGATIVE 04/25/2022 0853   BILIRUBINUR NEGATIVE 04/25/2022 0853   KETONESUR 20 (A) 04/25/2022 0853   PROTEINUR 100 (A) 04/25/2022 0853   NITRITE NEGATIVE 04/25/2022 0853   LEUKOCYTESUR NEGATIVE 04/25/2022 0853   Sepsis Labs Recent Labs  Lab 04/24/22 0504 04/25/22 0735 04/26/22 0901  WBC 11.3* 8.9 5.7    Microbiology No results found for this or any previous visit (from the past 240 hour(s)).   Time coordinating discharge: Over 30 minutes  SIGNED:   Little Ishikawa, DO [facility] 04/30/2022, 11:44 AM Pager   If 7PM-7AM, please contact night-coverage [email or web address]

## 2022-04-30 NOTE — NC FL2 (Signed)
[place] LEVEL OF CARE SCREENING TOOL     IDENTIFICATION  Patient Name: [name] Birthdate: Feb 17, 1936 Sex: female Admission Date (Current Location): 04/22/2022  [facility] and [place] Number:  Herbalist and Address:  [facility] [place], [place] [address] [place], [place] [address]      Provider Number: [id number]  Attending Physician Name and Address:  Little Ishikawa, MD  Relative Name and Phone Number:  Daughter, [name] [phone]    Current Level of Care: Hospital Recommended Level of Care: [place] Prior Approval Number:    Date Approved/Denied:   PASRR Number:    Discharge Plan: SNF    Current Diagnoses: Patient Active Problem List   Diagnosis Date Noted   Cholecystitis with cholelithiasis 04/24/2022   Acute cholecystitis 04/22/2022   Hyperglycemia 04/22/2022   Hypocalcemia 04/22/2022   Neuropathy 04/05/2022   Pericardial effusion without cardiac tamponade 01/28/2022   Acute pericarditis    Pacemaker    Heart block AV complete (HCC)    Paroxysmal atrial fibrillation (HCC)    Aortic atherosclerosis ([place])    Chest pain 01/27/2022   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   Syncope 10/15/2021   Hypertension    Cerebral AV malformation    Dyslipidemia    Acute kidney injury superimposed on chronic kidney disease (HCC)    Elevated LFTs 05/24/2021   Iron deficiency anemia 05/24/2021   Macrocytic anemia 05/17/2021   Hypokalemia 04/11/2021   Pressure injury of skin 07/22/2020   Hyponatremia 07/21/2020   Mixed hyperlipidemia 06/19/3233   Acute metabolic encephalopathy 57/32/2025   History of complex partial epilepsy 07/21/2020   Hypomagnesemia 07/21/2020   Hypokalemia due to excessive gastrointestinal loss of potassium 07/21/2020   Diarrhea 07/21/2020   Partial symptomatic epilepsy with complex partial seizures, not intractable, without status epilepticus ([place]) 07/28/2014   Spinal stenosis of  lumbar region 07/28/2014   Abnormality of gait 11/24/2013   Low back pain 11/24/2013   Essential hypertension    Hypercholesterolemia    Carotid arterial disease (HCC)    MVP (mitral valve prolapse)    Abnormal cardiovascular stress test     Orientation RESPIRATION BLADDER Height & Weight     Self, Time, Place  Normal External catheter Weight: 138 lb 0.1 oz (62.6 kg) Height:  '5\' 6"'$  (167.6 cm)  BEHAVIORAL SYMPTOMS/MOOD NEUROLOGICAL BOWEL NUTRITION STATUS      Incontinent Diet (Regular)  AMBULATORY STATUS COMMUNICATION OF NEEDS Skin   Extensive Assist Verbally Normal                       Personal Care Assistance Level of Assistance  Bathing, Feeding, Dressing Bathing Assistance: Limited assistance Feeding assistance: Limited assistance Dressing Assistance: Maximum assistance     Functional Limitations Info  Sight, Hearing, Speech Sight Info: Adequate Hearing Info: Adequate Speech Info: Adequate    SPECIAL CARE FACTORS FREQUENCY  PT (By licensed PT), OT (By licensed OT), Speech therapy                    Contractures Contractures Info: Not present    Additional Factors Info  Code Status Code Status Info: DNR Allergies Info: Aleve (Naproxen), Azithromycin, Penicillins, Pravastatin, Sulfa Antibiotics, Sulfa Drugs Cross Reactors, Tape, Naproxen Sodium           Current Medications (04/30/2022):  This is the current hospital active medication list Current Facility-Administered Medications  Medication Dose Route Frequency Provider Last Rate Last  Admin   acetaminophen (TYLENOL) 160 MG/5ML solution 1,000 mg  1,000 mg Oral Q6H PRN Barb Merino, MD       Chlorhexidine Gluconate Cloth 2 % PADS 6 each  6 each Topical Daily Barb Merino, MD   6 each at 04/30/22 0840   diphenhydrAMINE (BENADRYL) injection 12.5 mg  12.5 mg Intravenous Q6H PRN Meuth, Brooke A, PA-C       enoxaparin (LOVENOX) injection 40 mg  40 mg Subcutaneous Q24H Meuth, Brooke A, PA-C   40 mg  at 04/30/22 6837   hydrALAZINE (APRESOLINE) injection 10 mg  10 mg Intravenous Q4H PRN Barb Merino, MD       hydrALAZINE (APRESOLINE) tablet 25 mg  25 mg Oral BID Meuth, Brooke A, PA-C   25 mg at 04/30/22 0840   irbesartan (AVAPRO) tablet 150 mg  150 mg Oral Daily Meuth, Brooke A, PA-C   150 mg at 04/30/22 2902   magnesium oxide (MAG-OX) tablet 400 mg  400 mg Oral BID Barb Merino, MD   400 mg at 04/30/22 0840   metoprolol tartrate (LOPRESSOR) injection 5 mg  5 mg Intravenous Q6H PRN Meuth, Brooke A, PA-C   5 mg at 04/22/22 1830   morphine (PF) 2 MG/ML injection 2 mg  2 mg Intravenous Q3H PRN Meuth, Brooke A, PA-C   2 mg at 04/25/22 1312   Oral care mouth rinse  15 mL Mouth Rinse PRN Barb Merino, MD       oxyCODONE (Oxy IR/ROXICODONE) immediate release tablet 2.5-5 mg  2.5-5 mg Oral Q4H PRN Meuth, Brooke A, PA-C       PHENObarbital (LUMINAL) injection 65 mg  65 mg Intravenous Daily Barb Merino, MD   65 mg at 04/30/22 0842   phenytoin (DILANTIN) injection 100 mg  100 mg Intravenous Q12H Barb Merino, MD   100 mg at 04/30/22 0840   prochlorperazine (COMPAZINE) injection 10 mg  10 mg Intravenous Q6H PRN Meuth, Brooke A, PA-C   10 mg at 04/22/22 1830     Discharge Medications: Please see discharge summary for a list of discharge medications.  Relevant Imaging Results:  Relevant Lab Results:   Additional Information SSN [id number]  Wandra Feinstein [place], [place]

## 2022-04-30 NOTE — TOC Transition Note (Signed)
Transition of Care [facility]) - CM/SW Discharge Note   Patient Details  Name: [name] MRN: [id number] Date of Birth: Dec 15, 1935  [facility] CM/SW Contact:  Amador Cunas, [place] Phone Number: 04/30/2022, 11:58 AM   Clinical Narrative: Pt for dc to [facility] today. Spoke to pt's dtr who agrees pt will need rehab stay prior to ALF return. Autumn at [facility] confirmed they are prepared to admit pt to their rehab unit today. RN provided with number for report and PTAR arranged for transport. SW signing off at dc.   Wandra Feinstein, MSW, LCSW [phone] (coverage)        Final next level of care: Skilled Nursing Facility Barriers to Discharge: No Barriers Identified   Patient Goals and CMS Choice Patient states their goals for this hospitalization and ongoing recovery are:: Per daughter to return to [facility] CMS Medicare.gov Compare Post Acute Care list provided to:: Patient Choice offered to / list presented to : Adult Children  Discharge Placement              Patient chooses bed at: [facility] Patient to be transferred to facility by: [place] [facility] Name of family member notified: [name]/dtr Patient and family notified of of transfer: 04/30/22  Discharge Plan and Services In-house Referral: NA Discharge Planning Services: NA Post Acute Care Choice: Resumption of Svcs/PTA Provider          DME Arranged: N/A DME Agency: NA                  Social Determinants of Health (SDOH) Interventions     Readmission Risk Interventions     No data to display

## 2022-05-01 ENCOUNTER — Encounter: Payer: Self-pay | Admitting: Orthopedic Surgery

## 2022-05-01 ENCOUNTER — Non-Acute Institutional Stay (SKILLED_NURSING_FACILITY): Payer: Medicare Other | Admitting: Orthopedic Surgery

## 2022-05-01 DIAGNOSIS — R278 Other lack of coordination: Secondary | ICD-10-CM | POA: Diagnosis not present

## 2022-05-01 DIAGNOSIS — Z9049 Acquired absence of other specified parts of digestive tract: Secondary | ICD-10-CM

## 2022-05-01 DIAGNOSIS — K810 Acute cholecystitis: Secondary | ICD-10-CM | POA: Diagnosis not present

## 2022-05-01 DIAGNOSIS — M62561 Muscle wasting and atrophy, not elsewhere classified, right lower leg: Secondary | ICD-10-CM | POA: Diagnosis not present

## 2022-05-01 DIAGNOSIS — R2689 Other abnormalities of gait and mobility: Secondary | ICD-10-CM | POA: Diagnosis not present

## 2022-05-01 DIAGNOSIS — M62562 Muscle wasting and atrophy, not elsewhere classified, left lower leg: Secondary | ICD-10-CM | POA: Diagnosis not present

## 2022-05-01 DIAGNOSIS — G40209 Localization-related (focal) (partial) symptomatic epilepsy and epileptic syndromes with complex partial seizures, not intractable, without status epilepticus: Secondary | ICD-10-CM

## 2022-05-01 DIAGNOSIS — G9341 Metabolic encephalopathy: Secondary | ICD-10-CM | POA: Diagnosis not present

## 2022-05-01 DIAGNOSIS — I10 Essential (primary) hypertension: Secondary | ICD-10-CM | POA: Diagnosis not present

## 2022-05-01 NOTE — Progress Notes (Signed)
Location:  [facility] Room Number: 154/A Place of Service:  SNF [phone]) Provider:  Windell Moulding, NP   Virgie Dad, MD  Patient Care Team: Virgie Dad, MD as PCP - General (Internal Medicine) [place], [name], MD as PCP - Cardiology (Cardiology)  Extended Emergency Contact Information Primary Emergency Contact: [place] Mobile Phone: [phone] Relation: Daughter Secondary Emergency Contact: KRYSTEL, FLETCHALL Mobile Phone: [phone] Relation: Son  Code Status:  DNR Goals of care: Advanced Directive information    05/01/2022   10:04 AM  Advanced Directives  Does Patient Have a Medical Advance Directive? Yes  Type of Paramedic of [place];Living will;Out of facility DNR (pink MOST or yellow form)  Does patient want to make changes to medical advance directive? No - Patient declined  Copy of [place] in Chart? Yes - validated most recent copy scanned in chart (See row information)  Pre-existing out of facility DNR order (yellow form or pink MOST form) Yellow form placed in chart (order not valid for inpatient use)     Chief Complaint  Patient presents with   Acute Visit    Elevated High Blood Pressure   Quality Metric Gaps    To Discuss the following as listed to the Care Gaps as listed below.     HPI:  Pt is a 86 y.o. female seen today for an acute visit for elevated blood pressure.   She currently resides on the rehabilitation unit at [facility]. PMH: aortic atherosclerosis, cerebral AV malformation, HTN, HLD, Heart block AV-s/p PPM 05/30, HTN, MVP, afib, epilepsy, encephalopathy, neuropathy, anemia, and unstable gait.   Hospitalized 11/12-011/24 due to acute cholecystitis s/p lap chole. Concerns for postprocedure seizure. She was word finding with increased confusion after procedure. EEG unremarkable. Remains on dilantin and phenobarbital.   11/21 nursing reports blood pressure  186/52. She was given one time dose of clonidine 0.1 mg. Recheck blood pressure 190/42. Her furosemide was discontinued 11/20. Hydralazine 25 mg increased to TID. She is also taking Diovan. She denies chest pain, sob, blurred vision, headaches or generalized pain. I recheck her blood pressure manually and it was 148/50.    Past Medical History:  Diagnosis Date   Abnormal cardiovascular stress test    Carotid arterial disease (HCC)    MODERATE RIGHT   Cerebral AV malformation    DDD (degenerative disc disease)    SEVERE WITH SPINAL STENOSIS   Hypercholesterolemia    Hypertension    MVP (mitral valve prolapse)    MILD   Numbness    LEFT FOOT AND ANKLE   Osteopenia    Presence of permanent cardiac pacemaker    Pseudo-gout    Past Surgical History:  Procedure Laterality Date   APPENDECTOMY     BREAST MASS EXCISION     CARPAL TUNNEL RELEASE     CHOLECYSTECTOMY N/A 04/23/2022   Procedure: LAPAROSCOPIC CHOLECYSTECTOMY;  Surgeon: Coralie Keens, MD;  Location: [facility];  Service: General;  Laterality: N/A;   PACEMAKER IMPLANT N/A 11/07/2021   Procedure: PACEMAKER IMPLANT;  Surgeon: Evans Lance, MD;  Location: [place] [facility];  Service: Cardiovascular;  Laterality: N/A;   REMOVAL OF PARATHYROID GLAND     STRESS NUCLEAR STUDY     ABNORMAL   TONSILLECTOMY      Allergies  Allergen Reactions   Aleve [Naproxen] Other (See Comments)   Azithromycin Other (See Comments)    Reaction not recalled   Penicillins Hives and Other (  See Comments)    Bad reaction as a child after a shot of Penicillin   Pravastatin Other (See Comments)    Reaction not recalled   Sulfa Antibiotics Other (See Comments)   Sulfa Drugs Cross Reactors Other (See Comments)    Reaction not recalled- stated she can take it "in small doses"   Tape Itching and Other (See Comments)    Cannot wear for an extended period of time   Naproxen Sodium Rash    Outpatient Encounter Medications as of 05/01/2022   Medication Sig   acetaminophen (TYLENOL) 500 MG tablet Take 500 mg by mouth every 6 (six) hours as needed for mild pain or headache.   B Complex Vitamins (VITAMIN-B COMPLEX) TABS Take 1 tablet by mouth daily.   Dextromethorphan-guaiFENesin (ROBAFEN DM) 10-100 MG/5ML liquid Take 10 mLs by mouth every 6 (six) hours as needed (cough/congestion).   fluticasone (FLONASE) 50 MCG/ACT nasal spray Place 1-2 sprays into both nostrils daily.   hydrALAZINE (APRESOLINE) 25 MG tablet Take 25 mg by mouth 3 (three) times daily.   loperamide (IMODIUM A-D) 2 MG tablet Take 2 mg by mouth every 4 (four) hours as needed for diarrhea or loose stools.   magnesium oxide (MAG-OX) 400 (240 Mg) MG tablet Take 1 tablet (400 mg total) by mouth daily.   Multiple Vitamins-Minerals (MULTI COMPLETE/IRON) TABS Take 1 tablet by mouth daily with breakfast. Folic acid included   Nutritional Supplements (NUTRITIONAL DRINK PO) Take 1 Bottle by mouth daily. Chocolate Boost or Ensure with lunch   ondansetron (ZOFRAN-ODT) 4 MG disintegrating tablet Take 4 mg by mouth every 4 (four) hours as needed for nausea or vomiting.   PHENobarbital (LUMINAL) 64.8 MG tablet Take 1 tablet (64.8 mg total) by mouth in the morning.   phenytoin (DILANTIN) 100 MG ER capsule Take 200 mg by mouth daily with breakfast.   Polyethyl Glycol-Propyl Glycol (SYSTANE ULTRA) 0.4-0.3 % SOLN Place 1 drop into both eyes in the morning and at bedtime.   Probiotic Product (ALIGN) 4 MG CAPS Take 4 mg by mouth daily.   valsartan (DIOVAN) 160 MG tablet Take 1 tablet (160 mg total) by mouth daily.   [DISCONTINUED] loperamide (IMODIUM A-D) 2 MG tablet Take 1 tablet (2 mg total) by mouth as needed for diarrhea or loose stools.   No facility-administered encounter medications on file as of 05/01/2022.    Review of Systems  Constitutional:  Negative for activity change, appetite change, fatigue and fever.  HENT:  Negative for congestion and trouble swallowing.   Eyes:   Negative for visual disturbance.  Respiratory:  Negative for cough, shortness of breath and wheezing.   Cardiovascular:  Positive for leg swelling. Negative for chest pain.  Gastrointestinal:  Negative for abdominal distention, abdominal pain, constipation, diarrhea, nausea and vomiting.  Genitourinary:  Negative for dysuria, frequency and hematuria.  Musculoskeletal:  Positive for gait problem.  Skin:  Negative for wound.  Neurological:  Positive for weakness. Negative for dizziness and headaches.  Psychiatric/Behavioral:  Positive for confusion. Negative for dysphoric mood. The patient is not nervous/anxious.     Immunization History  Administered Date(s) Administered   Influenza Split 03/28/2009, 03/17/2010, 03/26/2011, 04/01/2012, 03/05/2013   Influenza, Quadrivalent, Recombinant, Inj, Pf 03/08/2018, 02/13/2019, 02/24/2020   Influenza,inj,Quad PF,6+ Mos 02/25/2014, 03/05/2015   Influenza-Unspecified 02/09/2014, 02/25/2016, 04/06/2017, 04/25/2021   Moderna Sars-Covid-2 Vaccination 07/17/2019, 08/14/2019, 02/24/2020, 04/26/2020   Pneumococcal Conjugate-13 11/26/2014   Pneumococcal-Unspecified 06/23/2003, 11/10/2012   Td (Adult), 2 Lf Tetanus Toxid, Preservative Free 06/11/2000,  11/06/2011   Unspecified SARS-COV-2 Vaccination 04/28/2021   Zoster Recombinat (Shingrix) 01/09/2019, 05/08/2019   Zoster, Live 01/15/2006, 01/15/2019   Pertinent  Health Maintenance Due  Topic Date Due   DEXA SCAN  Never done   INFLUENZA VACCINE  01/09/2022      04/28/2022    8:00 AM 04/28/2022   10:00 PM 04/29/2022    8:00 AM 04/29/2022    8:10 PM 05/01/2022    9:41 AM  Fall Risk  Falls in the past year?     0  Was there an injury with Fall?     0  Fall Risk Category Calculator     0  Fall Risk Category     Low  Patient Fall Risk Level High fall risk High fall risk High fall risk High fall risk High fall risk  Patient at Risk for Falls Due to     Impaired balance/gait;Impaired mobility  Fall  risk Follow up     Falls evaluation completed   Functional Status Survey:    Vitals:   05/01/22 0933  BP: (!) 153/53  Pulse: 60  Resp: 19  Temp: 97.8 F (36.6 C)  SpO2: 97%  Weight: 137 lb 4 oz (62.3 kg)  Height: '5\' 5"'$  (1.651 m)   Body mass index is 22.84 kg/m. Physical Exam Vitals reviewed.  Constitutional:      General: She is not in acute distress. HENT:     Head: Normocephalic.  Eyes:     General:        Right eye: No discharge.        Left eye: No discharge.  Cardiovascular:     Rate and Rhythm: Normal rate and regular rhythm.     Pulses: Normal pulses.     Heart sounds: Normal heart sounds.  Pulmonary:     Effort: Pulmonary effort is normal. No respiratory distress.     Breath sounds: Normal breath sounds. No wheezing.  Abdominal:     General: Bowel sounds are normal. There is no distension.     Palpations: Abdomen is soft.     Tenderness: There is no abdominal tenderness.  Musculoskeletal:     Cervical back: Neck supple.     Right lower leg: Edema present.     Left lower leg: Edema present.     Comments: BLE non pitting edema  Skin:    General: Skin is warm and dry.     Capillary Refill: Capillary refill takes less than 2 seconds.  Neurological:     General: No focal deficit present.     Mental Status: She is alert. Mental status is at baseline.     Motor: Weakness present.     Gait: Gait abnormal.     Comments: walker  Psychiatric:        Mood and Affect: Mood normal.        Behavior: Behavior normal.     Comments: Very pleasant, follows commands, forgetful, alert to self/person/place     Labs reviewed: Recent Labs    04/22/22 1826 04/23/22 0811 04/24/22 0504 04/25/22 0735 04/26/22 0901 04/29/22 0436  NA  --    < > 136 135 136  --   K  --    < > 3.7 3.7 3.8  --   CL  --    < > 103 103 105  --   CO2  --    < > 22 19* 21*  --   GLUCOSE  --    < >  91 89 98  --   BUN  --    < > 29* 29* 27*  --   CREATININE  --    < > 1.14* 1.32* 1.24*  1.07*  CALCIUM 8.1*   < > 8.1* 8.4* 8.6*  --   MG 1.1*  --  1.8 1.9  --   --   PHOS  --   --  4.4 3.5  --   --    < > = values in this interval not displayed.   Recent Labs    04/24/22 0504 04/25/22 0735 04/26/22 0901  AST '24 21 18  '$ ALT '16 15 12  '$ ALKPHOS 52 53 54  BILITOT 0.7 1.1 0.9  PROT 5.8* 5.9* 5.9*  ALBUMIN 2.8* 2.8* 2.6*   Recent Labs    04/22/22 0434 04/23/22 0811 04/24/22 0504 04/25/22 0735 04/26/22 0901  WBC 13.3*   < > 11.3* 8.9 5.7  NEUTROABS 11.9*  --  10.0* 7.6  --   HGB 10.2*   < > 9.1* 9.5* 10.2*  HCT 31.7*   < > 28.1* 29.3* 32.4*  MCV 104.3*   < > 104.5* 103.5* 106.2*  PLT 288   < > 237 274 283   < > = values in this interval not displayed.   Lab Results  Component Value Date   TSH 4.325 04/25/2022   Lab Results  Component Value Date   HGBA1C 4.9 04/22/2022   Lab Results  Component Value Date   CHOL 208 (H) 01/28/2022   HDL 80 01/28/2022   LDLCALC 116 (H) 01/28/2022   TRIG 59 01/28/2022   CHOLHDL 2.6 01/28/2022    Significant Diagnostic Results in last 30 days:  Overnight EEG with video  Result Date: 04/27/2022 Lora Havens, MD     04/28/2022  8:46 AM Patient Name: DENIECE RANKIN MRN: [id number] Epilepsy Attending: Lora Havens Referring Physician/Provider: Lorenza Chick, MD Duration: 04/26/2022 2330  to 04/27/2022 2330  Patient history: 86yo F with ams. EEG to evaluate for seizure  Level of alertness: Awake, asleep  AEDs during EEG study: LEV  Technical aspects: This EEG study was done with scalp electrodes positioned according to the 10-20 International system of electrode placement. Electrical activity was reviewed with band pass filter of 1-'70Hz'$ , sensitivity of 7 uV/mm, display speed of 48m/sec with a '60Hz'$  notched filter applied as appropriate. EEG data were recorded continuously and digitally stored.  Video monitoring was available and reviewed as appropriate.  Description: No clear posterior dominant rhythm was seen. Sleep  was characterized by sleep spindles (12-'14hz'$ ), maximal fronto-central region. EEG showed continuous generalized 3 to 6 Hz theta-delta slowing. Generalized periodic discharges with triphasic morphology were noted at '1hz'$ , more prominent when awake/stimulated. Hyperventilation and photic stimulation were not performed.   ABNORMALITY - Periodic discharges with triphasic morphology, generalized - Continuous slow, generalized  IMPRESSION: This study showed generalized periodic discharges with triphasic morphology which can be on the ictal-interictal continuum. However, the morphology, frequency and reactivity to stimulation is more commonly indicative of toxic-metabolic causes. Additionally there is moderate diffuse encephalopathy, nonspecific etiology. No seizures were seen throughout the recording.  PLora Havens  EEG adult  Result Date: 04/26/2022 YLora Havens MD     04/26/2022 11:57 AM Patient Name: MCARMELLA KEESMRN: [id number] Attending: PLora HavensReferring Physician/Provider: GBarb Merino MD Date:04/26/2022 Duration: 23.22 mins Patient history: 89yoF with ams. EEG to evaluate for seizure Level of alertness: Awake AEDs during  EEG study: LEV Technical aspects: This EEG study was done with scalp electrodes positioned according to the 10-20 International system of electrode placement. Electrical activity was reviewed with band pass filter of 1-'70Hz'$ , sensitivity of 7 uV/mm, display speed of 33m/sec with a '60Hz'$  notched filter applied as appropriate. EEG data were recorded continuously and digitally stored.  Video monitoring was available and reviewed as appropriate. Description: EEG showed continuous generalized 3 to 6 Hz theta-delta slowing. Hyperventilation and photic stimulation were not performed.   Of note, study was technically difficult due to significant movement and myogenic artifact. ABNORMALITY - Continuous slow, generalized IMPRESSION: This technically difficult study is  suggestive of moderate diffuse encephalopathy, nonspecific etiology.  No seizures or epileptiform discharges were seen throughout the recording. PLora Havens  MR BRAIN WO CONTRAST  Result Date: 04/25/2022 CLINICAL DATA:  Mental status change.  Confusion. EXAM: MRI HEAD WITHOUT CONTRAST TECHNIQUE: Multiplanar, multiecho pulse sequences of the brain and surrounding structures were obtained without intravenous contrast. COMPARISON:  CT head 04/25/2022 FINDINGS: Brain: Large lesion left frontal lobe measuring approximately 4.7 x 5.1 cm. This is calcified on CT with multiple flow voids and dilated vessels on MRI. There is a large draining superficial cortical vein. The left anterior cerebral artery is enlarged and appears to be the primary supply to the presumed AVM. Enlarged veins also present within the left lateral ventricle. No evidence of prior hemorrhage. Negative for hydrocephalus. Generalized atrophy. Negative for acute infarct. Vascular: Flow voids are present in the circle-of-Willis. 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 There is also a possible aneurysm of the anterior communicating artery. Large AVM left frontal lobe as described above. Skull and upper cervical spine: No focal skeletal abnormality. Sinuses/Orbits: Mucosal edema left maxillary sinus. Manning sinuses clear. Bilateral cataract extraction Other: None IMPRESSION: 1. Large AVM left frontal lobe measuring 4.7 x 5.1 cm. There is a large draining superficial cortical vein. The left anterior cerebral artery is enlarged and appears to be the primary supply to the AVM. No evidence of prior hemorrhage. 2. 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 3. Atrophy. No acute infarct. Electronically Signed   By: CFranchot GalloM.D.   On: 04/25/2022 14:32   CT HEAD WO CONTRAST (5MM)  Result Date: 04/25/2022 CLINICAL DATA:  Mental status change. EXAM:  CT HEAD WITHOUT CONTRAST TECHNIQUE: Contiguous axial images were obtained from the base of the skull through the vertex without intravenous contrast. RADIATION DOSE REDUCTION: This exam was performed according to the departmental dose-optimization program which includes automated exposure control, adjustment of the mA and/or kV according to patient size and/or use of iterative reconstruction technique. COMPARISON:  Head CT 10/15/2021 FINDINGS: Brain: Stable large partially calcified left frontal lobe process likely chronic AVM. No surrounding edema to suggest infarction or inflammation. The ventricles are stable in size and configuration and in the midline without mass effect or shift. No extra-axial fluid collections are identified. No CT findings for acute hemispheric infarction or intracranial hemorrhage. No mass lesions. The brainstem and cerebellum are grossly normal. Vascular: Stable vascular calcifications.  No hyperdense vessels. Skull: No skull fracture or bone lesions. Sinuses/Orbits: The paranasal sinuses and mastoid air cells are clear except for fluid in the left maxillary sinus. The globes are intact. Other: Extensive calcification noted around the annular ligament at C1-2 and there is extensive calcification involving the temporomandibular joints. Findings suggest CPPD arthropathy. IMPRESSION: 1. Stable large partially calcified  left frontal lobe process likely chronic AVM. 2. No acute intracranial findings. 3. Fluid in the left maxillary sinus. 4. Chronic changes of CPPD arthropathy. Electronically Signed   By: Marijo Sanes M.D.   On: 04/25/2022 11:46   US Abdomen Limited RUQ (LIVER/GB)  Result Date: 04/22/2022 CLINICAL DATA:  Pain right upper quadrant EXAM: ULTRASOUND ABDOMEN LIMITED RIGHT UPPER QUADRANT COMPARISON:  CT done earlier today FINDINGS: Gallbladder: There is 2 cm calculus in the lumen of gallbladder. There are possible small polyps in the margin of lumen. There is wall thickening  in gallbladder measuring up to 11 mm. There is trace amount of pericholecystic fluid. Technologist did not observe any tenderness over the gallbladder during the study. Common bile duct: Diameter: 5 mm Liver: There is nodularity in liver surface. There is slightly increased echogenicity in liver. There is small amount of perihepatic ascites. Portal vein is patent on color Doppler imaging with normal direction of blood flow towards the liver. Other: Right pleural effusion is seen. IMPRESSION: Large gallbladder stone and possible small gallbladder polyps. There is diffuse wall thickening in gallbladder which may be due to acute or chronic cholecystitis. Technologist did not observe any tenderness over the gallbladder during the study. Please correlate with clinical physical examination findings and laboratory findings. There is mild nodularity in liver surface suggesting possible cirrhosis. Small perihepatic ascites. Right pleural effusion. Electronically Signed   By: Elmer Picker M.D.   On: 04/22/2022 08:54   CT ABDOMEN PELVIS W CONTRAST  Addendum Date: 04/22/2022   ADDENDUM REPORT: 04/22/2022 07:16 ADDENDUM: As mentioned in the body of the report there is a 2.3 x 1.5 cm left adrenal nodule which is indeterminate. Attention at time of forthcoming abdominal MRI is recommended for further characterization. Electronically Signed   By: Vinnie Langton M.D.   On: 04/22/2022 07:16   Result Date: 04/22/2022 CLINICAL DATA:  86 year old female with history of right-sided abdominal tenderness and nausea. EXAM: CT ABDOMEN AND PELVIS WITH CONTRAST TECHNIQUE: Multidetector CT imaging of the abdomen and pelvis was performed using the standard protocol following bolus administration of intravenous contrast. RADIATION DOSE REDUCTION: This exam was performed according to the departmental dose-optimization program which includes automated exposure control, adjustment of the mA and/or kV according to patient size and/or  use of iterative reconstruction technique. CONTRAST:  139m OMNIPAQUE IOHEXOL 300 MG/ML  SOLN COMPARISON:  CT of the abdomen and pelvis 04/17/2021. FINDINGS: Lower chest: Bilateral pleural effusions with extensive passive subsegmental atelectasis in the visualized lung bases. Cardiomegaly. Pacemaker leads noted in the right-side of the heart. Hepatobiliary: No suspicious cystic or solid hepatic lesions. No intra or extrahepatic biliary ductal dilatation. 2.4 x 1.5 cm calcified gallstone lying dependently in the gallbladder. Gallbladder is moderately distended. Gallbladder wall is thickened diffusely with heterogeneous areas of enhancement. Trace volume of pericholecystic fluid. Pancreas: No pancreatic mass. No pancreatic ductal dilatation. No pancreatic or peripancreatic fluid collections or inflammatory changes. Spleen: Subcentimeter hypovascular lesion in the anterior aspect of the spleen, too small to characterize, but similar to the prior study from 2022, presumably benign (no imaging follow-up recommended). Adrenals/Urinary Tract: Low-attenuation lesions in both kidneys, compatible with simple cysts, largest of which is exophytic in the posteromedial aspect of the upper pole of the left kidney measuring 2.7 cm in diameter (no imaging follow-up recommended). Multifocal cortical thinning in both kidneys. No suspicious renal lesions. No hydroureteronephrosis. Urinary bladder is normal in appearance. Right adrenal gland is normal in appearance. 2.3 x 1.5 cm left adrenal nodule (  axial image 22 of series 2). Stomach/Bowel: The stomach is nearly completely decompressed, but otherwise unremarkable in appearance. No pathologic dilatation of small bowel or colon. Normal appendix. Vascular/Lymphatic: Atherosclerosis throughout the abdominal aorta and pelvic vasculature, without evidence of aneurysm or dissection in the abdominal or pelvic vasculature. No lymphadenopathy noted in the abdomen or pelvis. Reproductive: Uterus  and ovaries are atrophic. Other: Supraumbilical ventral hernia containing some omental fat and a small volume of ascites. No pneumoperitoneum. Small volume of ascites. Musculoskeletal: There are no aggressive appearing lytic or blastic lesions noted in the visualized portions of the skeleton. IMPRESSION: 1. Gallbladder is distended with extensive mucosal hyperenhancement, mural thickening and trace amount of pericholecystic fluid. There is a large calcified gallstone within the lumen of the gallbladder, however, this does not appear to be in an obstructive position at this time. Overall, findings are clearly abnormal and concerning for potential acute cholecystitis, but correlation with abdominal MRI with and without IV gadolinium with MRCP is recommended to better evaluate these findings and assess for potential choledocholithiasis. Surgical consultation is also recommended. 2. Small volume of ascites. 3. Supraumbilical ventral hernia containing omental fat and small volume of ascites. No definite bowel incarceration or obstruction at this time. 4. Bilateral pleural effusions with passive areas of subsegmental atelectasis in the visualized lung bases. 5. Cardiomegaly. 6. Aortic atherosclerosis. Electronically Signed: By: Vinnie Langton M.D. On: 04/22/2022 07:09    Assessment/Plan 1. Essential hypertension - bp 186/52> clonidine 0.1 mg> 190/42 - manual bp during encounter 148/50 - asymptomatic - 11/20 furosemide discontinued - 11/20 Hydralazine increased to TID - will start hydralazine 25 mg daily prn for elevated bp - cont Diovan - recommend manual blood pressures- place cuff over uncovered arm, use correct cuff size - may consider low sodium diet in future  2. Partial symptomatic epilepsy with complex partial seizures, not intractable, without status epilepticus ([place]) - concerns for postoperative seizure last hospitalization - EEG unremarkable - cont Dilantin and phenobarbital   3. S/P  laparoscopic cholecystectomy - 11/13 lap chole - Dr. Ninfa Linden - appetite improving - denies pain, N/V, LBM 11/21 - cont PT/OT  - cont tylenol prn    Family/ staff Communication: plan discussed with patient and nurse  Labs/tests ordered:  none

## 2022-05-02 DIAGNOSIS — R278 Other lack of coordination: Secondary | ICD-10-CM | POA: Diagnosis not present

## 2022-05-02 DIAGNOSIS — M6389 Disorders of muscle in diseases classified elsewhere, multiple sites: Secondary | ICD-10-CM | POA: Diagnosis not present

## 2022-05-02 DIAGNOSIS — G9341 Metabolic encephalopathy: Secondary | ICD-10-CM | POA: Diagnosis not present

## 2022-05-02 DIAGNOSIS — M62562 Muscle wasting and atrophy, not elsewhere classified, left lower leg: Secondary | ICD-10-CM | POA: Diagnosis not present

## 2022-05-02 DIAGNOSIS — R4189 Other symptoms and signs involving cognitive functions and awareness: Secondary | ICD-10-CM | POA: Diagnosis not present

## 2022-05-02 DIAGNOSIS — K810 Acute cholecystitis: Secondary | ICD-10-CM | POA: Diagnosis not present

## 2022-05-02 DIAGNOSIS — M62561 Muscle wasting and atrophy, not elsewhere classified, right lower leg: Secondary | ICD-10-CM | POA: Diagnosis not present

## 2022-05-02 DIAGNOSIS — R2689 Other abnormalities of gait and mobility: Secondary | ICD-10-CM | POA: Diagnosis not present

## 2022-05-04 DIAGNOSIS — K810 Acute cholecystitis: Secondary | ICD-10-CM | POA: Diagnosis not present

## 2022-05-04 DIAGNOSIS — M62561 Muscle wasting and atrophy, not elsewhere classified, right lower leg: Secondary | ICD-10-CM | POA: Diagnosis not present

## 2022-05-04 DIAGNOSIS — R2689 Other abnormalities of gait and mobility: Secondary | ICD-10-CM | POA: Diagnosis not present

## 2022-05-04 DIAGNOSIS — G9341 Metabolic encephalopathy: Secondary | ICD-10-CM | POA: Diagnosis not present

## 2022-05-04 DIAGNOSIS — M62562 Muscle wasting and atrophy, not elsewhere classified, left lower leg: Secondary | ICD-10-CM | POA: Diagnosis not present

## 2022-05-04 DIAGNOSIS — R278 Other lack of coordination: Secondary | ICD-10-CM | POA: Diagnosis not present

## 2022-05-07 ENCOUNTER — Telehealth: Payer: Self-pay | Admitting: Internal Medicine

## 2022-05-07 DIAGNOSIS — R531 Weakness: Secondary | ICD-10-CM | POA: Diagnosis not present

## 2022-05-07 DIAGNOSIS — Z79899 Other long term (current) drug therapy: Secondary | ICD-10-CM | POA: Diagnosis not present

## 2022-05-07 DIAGNOSIS — M62561 Muscle wasting and atrophy, not elsewhere classified, right lower leg: Secondary | ICD-10-CM | POA: Diagnosis not present

## 2022-05-07 DIAGNOSIS — M62562 Muscle wasting and atrophy, not elsewhere classified, left lower leg: Secondary | ICD-10-CM | POA: Diagnosis not present

## 2022-05-07 DIAGNOSIS — R278 Other lack of coordination: Secondary | ICD-10-CM | POA: Diagnosis not present

## 2022-05-07 DIAGNOSIS — R2689 Other abnormalities of gait and mobility: Secondary | ICD-10-CM | POA: Diagnosis not present

## 2022-05-07 DIAGNOSIS — R4189 Other symptoms and signs involving cognitive functions and awareness: Secondary | ICD-10-CM | POA: Diagnosis not present

## 2022-05-07 DIAGNOSIS — M6389 Disorders of muscle in diseases classified elsewhere, multiple sites: Secondary | ICD-10-CM | POA: Diagnosis not present

## 2022-05-07 DIAGNOSIS — K810 Acute cholecystitis: Secondary | ICD-10-CM | POA: Diagnosis not present

## 2022-05-07 DIAGNOSIS — G9341 Metabolic encephalopathy: Secondary | ICD-10-CM | POA: Diagnosis not present

## 2022-05-07 LAB — COMPREHENSIVE METABOLIC PANEL
Albumin: 3 — AB (ref 3.5–5.0)
Calcium: 8.6 — AB (ref 8.7–10.7)
Globulin: 2.2
eGFR: 52

## 2022-05-07 LAB — BASIC METABOLIC PANEL
BUN: 30 — AB (ref 4–21)
CO2: 25 — AB (ref 13–22)
Chloride: 103 (ref 99–108)
Creatinine: 1.1 (ref 0.5–1.1)
Glucose: 99
Potassium: 4.6 mEq/L (ref 3.5–5.1)
Sodium: 141 (ref 137–147)

## 2022-05-07 LAB — CBC AND DIFFERENTIAL
HCT: 29 — AB (ref 36–46)
Hemoglobin: 10 — AB (ref 12.0–16.0)
Platelets: 411 10*3/uL — AB (ref 150–400)
WBC: 6.7

## 2022-05-07 LAB — CBC: RBC: 2.9 — AB (ref 3.87–5.11)

## 2022-05-07 LAB — HEPATIC FUNCTION PANEL
ALT: 16 U/L (ref 7–35)
AST: 26 (ref 13–35)
Alkaline Phosphatase: 59 (ref 25–125)
Bilirubin, Total: 0.2

## 2022-05-07 NOTE — Telephone Encounter (Signed)
Labs showed Magnesium of 1.2  Will increase her  Mag oxide 400 mg TID  Her potassium was normal Repeat Mag level in 1 week Also BO running high Change Hydralazine to 37.5 mg TID

## 2022-05-08 DIAGNOSIS — M62561 Muscle wasting and atrophy, not elsewhere classified, right lower leg: Secondary | ICD-10-CM | POA: Diagnosis not present

## 2022-05-08 DIAGNOSIS — R278 Other lack of coordination: Secondary | ICD-10-CM | POA: Diagnosis not present

## 2022-05-08 DIAGNOSIS — R4189 Other symptoms and signs involving cognitive functions and awareness: Secondary | ICD-10-CM | POA: Diagnosis not present

## 2022-05-08 DIAGNOSIS — K810 Acute cholecystitis: Secondary | ICD-10-CM | POA: Diagnosis not present

## 2022-05-08 DIAGNOSIS — M62562 Muscle wasting and atrophy, not elsewhere classified, left lower leg: Secondary | ICD-10-CM | POA: Diagnosis not present

## 2022-05-08 DIAGNOSIS — G9341 Metabolic encephalopathy: Secondary | ICD-10-CM | POA: Diagnosis not present

## 2022-05-08 DIAGNOSIS — M6389 Disorders of muscle in diseases classified elsewhere, multiple sites: Secondary | ICD-10-CM | POA: Diagnosis not present

## 2022-05-08 DIAGNOSIS — R2689 Other abnormalities of gait and mobility: Secondary | ICD-10-CM | POA: Diagnosis not present

## 2022-05-09 ENCOUNTER — Ambulatory Visit (INDEPENDENT_AMBULATORY_CARE_PROVIDER_SITE_OTHER): Payer: Medicare Other

## 2022-05-09 ENCOUNTER — Non-Acute Institutional Stay (SKILLED_NURSING_FACILITY): Payer: Medicare Other | Admitting: Orthopedic Surgery

## 2022-05-09 ENCOUNTER — Ambulatory Visit: Payer: Medicare Other | Admitting: Gastroenterology

## 2022-05-09 ENCOUNTER — Encounter: Payer: Self-pay | Admitting: Orthopedic Surgery

## 2022-05-09 DIAGNOSIS — M6389 Disorders of muscle in diseases classified elsewhere, multiple sites: Secondary | ICD-10-CM | POA: Diagnosis not present

## 2022-05-09 DIAGNOSIS — M62562 Muscle wasting and atrophy, not elsewhere classified, left lower leg: Secondary | ICD-10-CM | POA: Diagnosis not present

## 2022-05-09 DIAGNOSIS — I442 Atrioventricular block, complete: Secondary | ICD-10-CM | POA: Diagnosis not present

## 2022-05-09 DIAGNOSIS — R278 Other lack of coordination: Secondary | ICD-10-CM | POA: Diagnosis not present

## 2022-05-09 DIAGNOSIS — R2689 Other abnormalities of gait and mobility: Secondary | ICD-10-CM | POA: Diagnosis not present

## 2022-05-09 DIAGNOSIS — I10 Essential (primary) hypertension: Secondary | ICD-10-CM

## 2022-05-09 DIAGNOSIS — K810 Acute cholecystitis: Secondary | ICD-10-CM | POA: Diagnosis not present

## 2022-05-09 DIAGNOSIS — R21 Rash and other nonspecific skin eruption: Secondary | ICD-10-CM

## 2022-05-09 DIAGNOSIS — M62561 Muscle wasting and atrophy, not elsewhere classified, right lower leg: Secondary | ICD-10-CM | POA: Diagnosis not present

## 2022-05-09 DIAGNOSIS — R4189 Other symptoms and signs involving cognitive functions and awareness: Secondary | ICD-10-CM | POA: Diagnosis not present

## 2022-05-09 DIAGNOSIS — G9341 Metabolic encephalopathy: Secondary | ICD-10-CM | POA: Diagnosis not present

## 2022-05-09 MED ORDER — HYDROCORTISONE 1 % EX CREA: 1.0000 | TOPICAL_CREAM | Freq: Three times a day (TID) | CUTANEOUS | 0 refills | Status: DC | PRN

## 2022-05-09 NOTE — Progress Notes (Signed)
Location:  [facility] Room Number: 154/A Place of Service:  SNF [phone]) Provider:  Yvonna Alanis, NP   Virgie Dad, MD  Patient Care Team: Virgie Dad, MD as PCP - General (Internal Medicine) Martinique, [name], MD as PCP - Cardiology (Cardiology)  Extended Emergency Contact Information Primary Emergency Contact: [name] Mobile Phone: [phone] Relation: Daughter Secondary Emergency Contact: [name] Mobile Phone: [phone] Relation: Son  Code Status:  DNR Goals of care: Advanced Directive information    05/01/2022   10:04 AM  Advanced Directives  Does Patient Have a Medical Advance Directive? Yes  Type of Paramedic of [place];Living will;Out of facility DNR (pink MOST or yellow form)  Does patient want to make changes to medical advance directive? No - Patient declined  Copy of [place] in Chart? Yes - validated most recent copy scanned in chart (See row information)  Pre-existing out of facility DNR order (yellow form or pink MOST form) Yellow form placed in chart (order not valid for inpatient use)     Chief Complaint  Patient presents with   Acute Visit    rash    HPI:  Pt is a 86 y.o. female seen today for acute visit due to skin rash.   She currently resides on the rehabilitation unit at [facility]. PMH: aortic atherosclerosis, cerebral AV malformation, HTN, HLD, Heart block AV-s/p PPM 05/30, HTN, MVP, afib, epilepsy, encephalopathy, neuropathy, anemia, and unstable gait.   Left anterior forearm with increased itching, redness and swelling. Denies pain, warmth or injury. She has not changed soaps or detergents. Denies allergies to medications or foods.   11/21 she was noted to have high blood pressure 186/52. Hydralazine 37.5 mg  increased to TID. She is also on Diovan. SBP now averaging 150-160. She denies chest pain, sob, blurred vision or headaches.    Past  Medical History:  Diagnosis Date   Abnormal cardiovascular stress test    Carotid arterial disease (HCC)    MODERATE RIGHT   Cerebral AV malformation    DDD (degenerative disc disease)    SEVERE WITH SPINAL STENOSIS   Hypercholesterolemia    Hypertension    MVP (mitral valve prolapse)    MILD   Numbness    LEFT FOOT AND ANKLE   Osteopenia    Presence of permanent cardiac pacemaker    Pseudo-gout    Past Surgical History:  Procedure Laterality Date   APPENDECTOMY     BREAST MASS EXCISION     CARPAL TUNNEL RELEASE     CHOLECYSTECTOMY N/A 04/23/2022   Procedure: LAPAROSCOPIC CHOLECYSTECTOMY;  Surgeon: Coralie Keens, MD;  Location: [facility];  Service: General;  Laterality: N/A;   PACEMAKER IMPLANT N/A 11/07/2021   Procedure: PACEMAKER IMPLANT;  Surgeon: Evans Lance, MD;  Location: [facility];  Service: Cardiovascular;  Laterality: N/A;   REMOVAL OF PARATHYROID GLAND     STRESS NUCLEAR STUDY     ABNORMAL   TONSILLECTOMY      Allergies  Allergen Reactions   Aleve [Naproxen] Other (See Comments)   Azithromycin Other (See Comments)    Reaction not recalled   Penicillins Hives and Other (See Comments)    Bad reaction as a child after a shot of Penicillin   Pravastatin Other (See Comments)    Reaction not recalled   Sulfa Antibiotics Other (See Comments)   Sulfa Drugs Cross Reactors Other (See Comments)    Reaction not recalled-  stated she can take it "in small doses"   Tape Itching and Other (See Comments)    Cannot wear for an extended period of time   Naproxen Sodium Rash    Outpatient Encounter Medications as of 05/09/2022  Medication Sig   acetaminophen (TYLENOL) 500 MG tablet Take 500 mg by mouth every 6 (six) hours as needed for mild pain or headache.   B Complex Vitamins (VITAMIN-B COMPLEX) TABS Take 1 tablet by mouth daily.   Dextromethorphan-guaiFENesin (ROBAFEN DM) 10-100 MG/5ML liquid Take 10 mLs by mouth every 6 (six) hours as needed  (cough/congestion).   fluticasone (FLONASE) 50 MCG/ACT nasal spray Place 1-2 sprays into both nostrils daily.   hydrALAZINE (APRESOLINE) 25 MG tablet Take 37.5 mg by mouth 3 (three) times daily.   loperamide (IMODIUM A-D) 2 MG tablet Take 2 mg by mouth every 4 (four) hours as needed for diarrhea or loose stools.   magnesium oxide (MAG-OX) 400 (240 Mg) MG tablet Take 1 tablet (400 mg total) by mouth daily. (Patient taking differently: Take 400 mg by mouth 3 (three) times daily.)   Multiple Vitamins-Minerals (MULTI COMPLETE/IRON) TABS Take 1 tablet by mouth daily with breakfast. Folic acid included   Nutritional Supplements (NUTRITIONAL DRINK PO) Take 1 Bottle by mouth daily. Chocolate Boost or Ensure with lunch   ondansetron (ZOFRAN-ODT) 4 MG disintegrating tablet Take 4 mg by mouth every 4 (four) hours as needed for nausea or vomiting.   PHENobarbital (LUMINAL) 64.8 MG tablet Take 1 tablet (64.8 mg total) by mouth in the morning.   phenytoin (DILANTIN) 100 MG ER capsule Take 200 mg by mouth daily with breakfast.   Polyethyl Glycol-Propyl Glycol (SYSTANE ULTRA) 0.4-0.3 % SOLN Place 1 drop into both eyes in the morning and at bedtime.   Probiotic Product (ALIGN) 4 MG CAPS Take 4 mg by mouth daily.   valsartan (DIOVAN) 160 MG tablet Take 1 tablet (160 mg total) by mouth daily.   No facility-administered encounter medications on file as of 05/09/2022.    Review of Systems  Constitutional:  Negative for activity change, chills and fever.  Respiratory:  Negative for cough and wheezing.   Cardiovascular:  Negative for chest pain and leg swelling.  Gastrointestinal:  Negative for abdominal pain, constipation, diarrhea, nausea and vomiting.  Genitourinary:  Negative for dysuria.  Musculoskeletal:  Positive for gait problem.  Skin:  Positive for rash.  Neurological:  Positive for weakness. Negative for dizziness and headaches.  Psychiatric/Behavioral:  Negative for decreased concentration. The  patient is not nervous/anxious.     Immunization History  Administered Date(s) Administered   Influenza Split 03/28/2009, 03/17/2010, 03/26/2011, 04/01/2012, 03/05/2013   Influenza, Quadrivalent, Recombinant, Inj, Pf 03/08/2018, 02/13/2019, 02/24/2020   Influenza,inj,Quad PF,6+ Mos 02/25/2014, 03/05/2015   Influenza-Unspecified 02/09/2014, 02/25/2016, 04/06/2017, 04/25/2021   Moderna Sars-Covid-2 Vaccination 07/17/2019, 08/14/2019, 02/24/2020, 04/26/2020   Pneumococcal Conjugate-13 11/26/2014   Pneumococcal-Unspecified 06/23/2003, 11/10/2012   Td (Adult), 2 Lf Tetanus Toxid, Preservative Free 06/11/2000, 11/06/2011   Unspecified SARS-COV-2 Vaccination 04/28/2021   Zoster Recombinat (Shingrix) 01/09/2019, 05/08/2019   Zoster, Live 01/15/2006, 01/15/2019   Pertinent  Health Maintenance Due  Topic Date Due   DEXA SCAN  Never done   INFLUENZA VACCINE  01/09/2022      04/28/2022    8:00 AM 04/28/2022   10:00 PM 04/29/2022    8:00 AM 04/29/2022    8:10 PM 05/01/2022    9:41 AM  Fall Risk  Falls in the past year?     0  Was there an injury with Fall?     0  Fall Risk Category Calculator     0  Fall Risk Category     Low  Patient Fall Risk Level High fall risk High fall risk High fall risk High fall risk High fall risk  Patient at Risk for Falls Due to     Impaired balance/gait;Impaired mobility  Fall risk Follow up     Falls evaluation completed   Functional Status Survey:    Vitals:   05/09/22 2010  BP: (!) 152/74  Pulse: 63  Resp: 16  Temp: 98.1 F (36.7 C)  SpO2: 96%  Weight: 144 lb 3.2 oz (65.4 kg)  Height: '5\' 5"'$  (1.651 Little)   Body mass index is 24 kg/Little. Physical Exam Vitals reviewed.  Constitutional:      General: She is not in acute distress. HENT:     Head: Normocephalic.  Eyes:     General:        Right eye: No discharge.        Left eye: No discharge.  Cardiovascular:     Rate and Rhythm: Normal rate and regular rhythm.     Pulses: Normal pulses.      Heart sounds: Normal heart sounds.  Pulmonary:     Effort: Pulmonary effort is normal. No respiratory distress.     Breath sounds: Normal breath sounds. No wheezing.  Abdominal:     General: Bowel sounds are normal. There is no distension.     Palpations: Abdomen is soft.     Tenderness: There is no abdominal tenderness.  Musculoskeletal:     Cervical back: Neck supple.     Right lower leg: No edema.     Left lower leg: No edema.  Skin:    General: Skin is warm and dry.     Capillary Refill: Capillary refill takes less than 2 seconds.     Comments: Approx < 1 cm area of redness to left anterior forearm, mild swelling, no skin breakdown/injury/warmth/tenderness.   Neurological:     General: No focal deficit present.     Mental Status: She is alert and oriented to person, place, and time.  Psychiatric:        Mood and Affect: Mood normal.        Behavior: Behavior normal.     Labs reviewed: Recent Labs    04/22/22 1826 04/23/22 0811 04/24/22 0504 04/25/22 0735 04/26/22 0901 04/29/22 0436  NA  --    < > 136 135 136  --   K  --    < > 3.7 3.7 3.8  --   CL  --    < > 103 103 105  --   CO2  --    < > 22 19* 21*  --   GLUCOSE  --    < > 91 89 98  --   BUN  --    < > 29* 29* 27*  --   CREATININE  --    < > 1.14* 1.32* 1.24* 1.07*  CALCIUM 8.1*   < > 8.1* 8.4* 8.6*  --   MG 1.1*  --  1.8 1.9  --   --   PHOS  --   --  4.4 3.5  --   --    < > = values in this interval not displayed.   Recent Labs    04/24/22 0504 04/25/22 0735 04/26/22 0901  AST '24 21 18  '$ ALT 16  15 12  ALKPHOS 52 53 54  BILITOT 0.7 1.1 0.9  PROT 5.8* 5.9* 5.9*  ALBUMIN 2.8* 2.8* 2.6*   Recent Labs    04/22/22 0434 04/23/22 0811 04/24/22 0504 04/25/22 0735 04/26/22 0901  WBC 13.3*   < > 11.3* 8.9 5.7  NEUTROABS 11.9*  --  10.0* 7.6  --   HGB 10.2*   < > 9.1* 9.5* 10.2*  HCT 31.7*   < > 28.1* 29.3* 32.4*  MCV 104.3*   < > 104.5* 103.5* 106.2*  PLT 288   < > 237 274 283   < > = values in this  interval not displayed.   Lab Results  Component Value Date   TSH 4.325 04/25/2022   Lab Results  Component Value Date   HGBA1C 4.9 04/22/2022   Lab Results  Component Value Date   CHOL 208 (H) 01/28/2022   HDL 80 01/28/2022   LDLCALC 116 (H) 01/28/2022   TRIG 59 01/28/2022   CHOLHDL 2.6 01/28/2022    Significant Diagnostic Results in last 30 days:  Overnight EEG with video  Result Date: 04/27/2022 Lora Havens, MD     04/28/2022  8:46 AM Patient Name: [name] MRN: [id number] Epilepsy Attending: Lora Havens Referring Physician/Provider: Lorenza Chick, MD Duration: 04/26/2022 2330  to 04/27/2022 2330  Patient history: 86yo F with ams. EEG to evaluate for seizure  Level of alertness: Awake, asleep  AEDs during EEG study: LEV  Technical aspects: This EEG study was done with scalp electrodes positioned according to the 10-20 International system of electrode placement. Electrical activity was reviewed with band pass filter of 1-'70Hz'$ , sensitivity of 7 uV/mm, display speed of 73m/sec with a '60Hz'$  notched filter applied as appropriate. EEG data were recorded continuously and digitally stored.  Video monitoring was available and reviewed as appropriate.  Description: No clear posterior dominant rhythm was seen. Sleep was characterized by sleep spindles (12-'14hz'$ ), maximal fronto-central region. EEG showed continuous generalized 3 to 6 Hz theta-delta slowing. Generalized periodic discharges with triphasic morphology were noted at '1hz'$ , more prominent when awake/stimulated. Hyperventilation and photic stimulation were not performed.   ABNORMALITY - Periodic discharges with triphasic morphology, generalized - Continuous slow, generalized  IMPRESSION: This study showed generalized periodic discharges with triphasic morphology which can be on the ictal-interictal continuum. However, the morphology, frequency and reactivity to stimulation is more commonly indicative of toxic-metabolic  causes. Additionally there is moderate diffuse encephalopathy, nonspecific etiology. No seizures were seen throughout the recording.  PLora Havens  EEG adult  Result Date: 04/26/2022 YLora Havens MD     04/26/2022 11:57 AM Patient Name: MDANIYA ARAMBUROMRN: [id number] Attending: PLora HavensReferring Physician/Provider: GBarb Merino MD Date:04/26/2022 Duration: 23.22 mins Patient history: 89yoF with ams. EEG to evaluate for seizure Level of alertness: Awake AEDs during EEG study: LEV Technical aspects: This EEG study was done with scalp electrodes positioned according to the 10-20 International system of electrode placement. Electrical activity was reviewed with band pass filter of 1-'70Hz'$ , sensitivity of 7 uV/mm, display speed of 35msec with a '60Hz'$  notched filter applied as appropriate. EEG data were recorded continuously and digitally stored.  Video monitoring was available and reviewed as appropriate. Description: EEG showed continuous generalized 3 to 6 Hz theta-delta slowing. Hyperventilation and photic stimulation were not performed.   Of note, study was technically difficult due to significant movement and myogenic artifact. ABNORMALITY - Continuous slow, generalized IMPRESSION: This technically difficult study  is suggestive of moderate diffuse encephalopathy, nonspecific etiology.  No seizures or epileptiform discharges were seen throughout the recording. Lora Havens   MR BRAIN WO CONTRAST  Result Date: 04/25/2022 CLINICAL DATA:  Mental status change.  Confusion. EXAM: MRI HEAD WITHOUT CONTRAST TECHNIQUE: Multiplanar, multiecho pulse sequences of the brain and surrounding structures were obtained without intravenous contrast. COMPARISON:  CT head 04/25/2022 FINDINGS: Brain: Large lesion left frontal lobe measuring approximately 4.7 x 5.1 cm. This is calcified on CT with multiple flow voids and dilated vessels on MRI. There is a large draining superficial cortical  vein. The left anterior cerebral artery is enlarged and appears to be the primary supply to the presumed AVM. Enlarged veins also present within the left lateral ventricle. No evidence of prior hemorrhage. Negative for hydrocephalus. Generalized atrophy. Negative for acute infarct. Vascular: Flow voids are present in the circle-of-Willis. 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 There is also a possible aneurysm of the anterior communicating artery. Large AVM left frontal lobe as described above. Skull and upper cervical spine: No focal skeletal abnormality. Sinuses/Orbits: Mucosal edema left maxillary sinus. Manning sinuses clear. Bilateral cataract extraction Other: None IMPRESSION: 1. Large AVM left frontal lobe measuring 4.7 x 5.1 cm. There is a large draining superficial cortical vein. The left anterior cerebral artery is enlarged and appears to be the primary supply to the AVM. No evidence of prior hemorrhage. 2. 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 3. Atrophy. No acute infarct. Electronically Signed   By: Franchot Gallo Little.D.   On: 04/25/2022 14:32   CT HEAD WO CONTRAST (5MM)  Result Date: 04/25/2022 CLINICAL DATA:  Mental status change. EXAM: CT HEAD WITHOUT CONTRAST TECHNIQUE: Contiguous axial images were obtained from the base of the skull through the vertex without intravenous contrast. RADIATION DOSE REDUCTION: This exam was performed according to the departmental dose-optimization program which includes automated exposure control, adjustment of the mA and/or kV according to patient size and/or use of iterative reconstruction technique. COMPARISON:  Head CT 10/15/2021 FINDINGS: Brain: Stable large partially calcified left frontal lobe process likely chronic AVM. No surrounding edema to suggest infarction or inflammation. The ventricles are stable in size and configuration and in the midline without  mass effect or shift. No extra-axial fluid collections are identified. No CT findings for acute hemispheric infarction or intracranial hemorrhage. No mass lesions. The brainstem and cerebellum are grossly normal. Vascular: Stable vascular calcifications.  No hyperdense vessels. Skull: No skull fracture or bone lesions. Sinuses/Orbits: The paranasal sinuses and mastoid air cells are clear except for fluid in the left maxillary sinus. The globes are intact. Other: Extensive calcification noted around the annular ligament at C1-2 and there is extensive calcification involving the temporomandibular joints. Findings suggest CPPD arthropathy. IMPRESSION: 1. Stable large partially calcified left frontal lobe process likely chronic AVM. 2. No acute intracranial findings. 3. Fluid in the left maxillary sinus. 4. Chronic changes of CPPD arthropathy. Electronically Signed   By: Marijo Sanes Little.D.   On: 04/25/2022 11:46   US Abdomen Limited RUQ (LIVER/GB)  Result Date: 04/22/2022 CLINICAL DATA:  Pain right upper quadrant EXAM: ULTRASOUND ABDOMEN LIMITED RIGHT UPPER QUADRANT COMPARISON:  CT done earlier today FINDINGS: Gallbladder: There is 2 cm calculus in the lumen of gallbladder. There are possible small polyps in the margin of lumen. There is wall thickening in gallbladder measuring up to 11 mm. There is trace amount of pericholecystic  fluid. Technologist did not observe any tenderness over the gallbladder during the study. Common bile duct: Diameter: 5 mm Liver: There is nodularity in liver surface. There is slightly increased echogenicity in liver. There is small amount of perihepatic ascites. Portal vein is patent on color Doppler imaging with normal direction of blood flow towards the liver. Other: Right pleural effusion is seen. IMPRESSION: Large gallbladder stone and possible small gallbladder polyps. There is diffuse wall thickening in gallbladder which may be due to acute or chronic cholecystitis. Technologist  did not observe any tenderness over the gallbladder during the study. Please correlate with clinical physical examination findings and laboratory findings. There is mild nodularity in liver surface suggesting possible cirrhosis. Small perihepatic ascites. Right pleural effusion. Electronically Signed   By: Elmer Picker Little.D.   On: 04/22/2022 08:54   CT ABDOMEN PELVIS W CONTRAST  Addendum Date: 04/22/2022   ADDENDUM REPORT: 04/22/2022 07:16 ADDENDUM: As mentioned in the body of the report there is a 2.3 x 1.5 cm left adrenal nodule which is indeterminate. Attention at time of forthcoming abdominal MRI is recommended for further characterization. Electronically Signed   By: Vinnie Langton Little.D.   On: 04/22/2022 07:16   Result Date: 04/22/2022 CLINICAL DATA:  86 year old female with history of right-sided abdominal tenderness and nausea. EXAM: CT ABDOMEN AND PELVIS WITH CONTRAST TECHNIQUE: Multidetector CT imaging of the abdomen and pelvis was performed using the standard protocol following bolus administration of intravenous contrast. RADIATION DOSE REDUCTION: This exam was performed according to the departmental dose-optimization program which includes automated exposure control, adjustment of the mA and/or kV according to patient size and/or use of iterative reconstruction technique. CONTRAST:  174m OMNIPAQUE IOHEXOL 300 MG/ML  SOLN COMPARISON:  CT of the abdomen and pelvis 04/17/2021. FINDINGS: Lower chest: Bilateral pleural effusions with extensive passive subsegmental atelectasis in the visualized lung bases. Cardiomegaly. Pacemaker leads noted in the right-side of the heart. Hepatobiliary: No suspicious cystic or solid hepatic lesions. No intra or extrahepatic biliary ductal dilatation. 2.4 x 1.5 cm calcified gallstone lying dependently in the gallbladder. Gallbladder is moderately distended. Gallbladder wall is thickened diffusely with heterogeneous areas of enhancement. Trace volume of  pericholecystic fluid. Pancreas: No pancreatic mass. No pancreatic ductal dilatation. No pancreatic or peripancreatic fluid collections or inflammatory changes. Spleen: Subcentimeter hypovascular lesion in the anterior aspect of the spleen, too small to characterize, but similar to the prior study from 2022, presumably benign (no imaging follow-up recommended). Adrenals/Urinary Tract: Low-attenuation lesions in both kidneys, compatible with simple cysts, largest of which is exophytic in the posteromedial aspect of the upper pole of the left kidney measuring 2.7 cm in diameter (no imaging follow-up recommended). Multifocal cortical thinning in both kidneys. No suspicious renal lesions. No hydroureteronephrosis. Urinary bladder is normal in appearance. Right adrenal gland is normal in appearance. 2.3 x 1.5 cm left adrenal nodule (axial image 22 of series 2). Stomach/Bowel: The stomach is nearly completely decompressed, but otherwise unremarkable in appearance. No pathologic dilatation of small bowel or colon. Normal appendix. Vascular/Lymphatic: Atherosclerosis throughout the abdominal aorta and pelvic vasculature, without evidence of aneurysm or dissection in the abdominal or pelvic vasculature. No lymphadenopathy noted in the abdomen or pelvis. Reproductive: Uterus and ovaries are atrophic. Other: Supraumbilical ventral hernia containing some omental fat and a small volume of ascites. No pneumoperitoneum. Small volume of ascites. Musculoskeletal: There are no aggressive appearing lytic or blastic lesions noted in the visualized portions of the skeleton. IMPRESSION: 1. Gallbladder is distended with extensive mucosal  hyperenhancement, mural thickening and trace amount of pericholecystic fluid. There is a large calcified gallstone within the lumen of the gallbladder, however, this does not appear to be in an obstructive position at this time. Overall, findings are clearly abnormal and concerning for potential acute  cholecystitis, but correlation with abdominal MRI with and without IV gadolinium with MRCP is recommended to better evaluate these findings and assess for potential choledocholithiasis. Surgical consultation is also recommended. 2. Small volume of ascites. 3. Supraumbilical ventral hernia containing omental fat and small volume of ascites. No definite bowel incarceration or obstruction at this time. 4. Bilateral pleural effusions with passive areas of subsegmental atelectasis in the visualized lung bases. 5. Cardiomegaly. 6. Aortic atherosclerosis. Electronically Signed: By: Vinnie Langton Little.D. On: 04/22/2022 07:09    Assessment/Plan 1. Skin rash - increased itching to left anterior forearm, mild redness/swelling - hydrocortisone cream 1%- apply to left forearm TID prn - may apply ice to rash prn  2. Essential hypertension - improved - SBP now 150-160's - BUN/creat 27/1.24 04/26/2022 - cont increased hydralazine and Diovan    Family/ staff Communication: plan discussed with patient and nurse  Labs/tests ordered:  none

## 2022-05-10 DIAGNOSIS — R4189 Other symptoms and signs involving cognitive functions and awareness: Secondary | ICD-10-CM | POA: Diagnosis not present

## 2022-05-10 DIAGNOSIS — K810 Acute cholecystitis: Secondary | ICD-10-CM | POA: Diagnosis not present

## 2022-05-10 DIAGNOSIS — M62561 Muscle wasting and atrophy, not elsewhere classified, right lower leg: Secondary | ICD-10-CM | POA: Diagnosis not present

## 2022-05-10 DIAGNOSIS — R2689 Other abnormalities of gait and mobility: Secondary | ICD-10-CM | POA: Diagnosis not present

## 2022-05-10 DIAGNOSIS — R278 Other lack of coordination: Secondary | ICD-10-CM | POA: Diagnosis not present

## 2022-05-10 DIAGNOSIS — M62562 Muscle wasting and atrophy, not elsewhere classified, left lower leg: Secondary | ICD-10-CM | POA: Diagnosis not present

## 2022-05-10 DIAGNOSIS — M6389 Disorders of muscle in diseases classified elsewhere, multiple sites: Secondary | ICD-10-CM | POA: Diagnosis not present

## 2022-05-10 DIAGNOSIS — G9341 Metabolic encephalopathy: Secondary | ICD-10-CM | POA: Diagnosis not present

## 2022-05-10 LAB — CUP PACEART REMOTE DEVICE CHECK
Battery Remaining Longevity: 146 mo
Battery Voltage: 3.16 V
Brady Statistic AP VP Percent: 69.9 %
Brady Statistic AP VS Percent: 0.12 %
Brady Statistic AS VP Percent: 29.51 %
Brady Statistic AS VS Percent: 1.28 %
Brady Statistic RA Percent Paced: 4.04 %
Brady Statistic RV Percent Paced: 98.27 %
Date Time Interrogation Session: 20231129202127
Implantable Lead Connection Status: 753985
Implantable Lead Connection Status: 753985
Implantable Lead Implant Date: 20230530
Implantable Lead Implant Date: 20230530
Implantable Lead Location: 753859
Implantable Lead Location: 753860
Implantable Lead Model: 3830
Implantable Lead Model: 5076
Implantable Pulse Generator Implant Date: 20230530
Lead Channel Impedance Value: 152 Ohm
Lead Channel Impedance Value: 304 Ohm
Lead Channel Impedance Value: 380 Ohm
Lead Channel Impedance Value: 418 Ohm
Lead Channel Pacing Threshold Amplitude: 0.75 V
Lead Channel Pacing Threshold Amplitude: 1.375 V
Lead Channel Pacing Threshold Pulse Width: 0.4 ms
Lead Channel Pacing Threshold Pulse Width: 0.4 ms
Lead Channel Sensing Intrinsic Amplitude: 0.5 mV
Lead Channel Sensing Intrinsic Amplitude: 0.5 mV
Lead Channel Sensing Intrinsic Amplitude: 14.625 mV
Lead Channel Sensing Intrinsic Amplitude: 14.625 mV
Lead Channel Setting Pacing Amplitude: 2 V
Lead Channel Setting Pacing Amplitude: 2 V
Lead Channel Setting Pacing Pulse Width: 0.4 ms
Lead Channel Setting Sensing Sensitivity: 1.2 mV
Zone Setting Status: 755011

## 2022-05-11 DIAGNOSIS — K810 Acute cholecystitis: Secondary | ICD-10-CM | POA: Diagnosis not present

## 2022-05-11 DIAGNOSIS — M62561 Muscle wasting and atrophy, not elsewhere classified, right lower leg: Secondary | ICD-10-CM | POA: Diagnosis not present

## 2022-05-11 DIAGNOSIS — M6389 Disorders of muscle in diseases classified elsewhere, multiple sites: Secondary | ICD-10-CM | POA: Diagnosis not present

## 2022-05-11 DIAGNOSIS — R278 Other lack of coordination: Secondary | ICD-10-CM | POA: Diagnosis not present

## 2022-05-11 DIAGNOSIS — R2689 Other abnormalities of gait and mobility: Secondary | ICD-10-CM | POA: Diagnosis not present

## 2022-05-11 DIAGNOSIS — M62562 Muscle wasting and atrophy, not elsewhere classified, left lower leg: Secondary | ICD-10-CM | POA: Diagnosis not present

## 2022-05-11 DIAGNOSIS — G9341 Metabolic encephalopathy: Secondary | ICD-10-CM | POA: Diagnosis not present

## 2022-05-11 DIAGNOSIS — R4189 Other symptoms and signs involving cognitive functions and awareness: Secondary | ICD-10-CM | POA: Diagnosis not present

## 2022-05-14 DIAGNOSIS — M62562 Muscle wasting and atrophy, not elsewhere classified, left lower leg: Secondary | ICD-10-CM | POA: Diagnosis not present

## 2022-05-14 DIAGNOSIS — M62561 Muscle wasting and atrophy, not elsewhere classified, right lower leg: Secondary | ICD-10-CM | POA: Diagnosis not present

## 2022-05-14 DIAGNOSIS — R2689 Other abnormalities of gait and mobility: Secondary | ICD-10-CM | POA: Diagnosis not present

## 2022-05-14 DIAGNOSIS — M6389 Disorders of muscle in diseases classified elsewhere, multiple sites: Secondary | ICD-10-CM | POA: Diagnosis not present

## 2022-05-14 DIAGNOSIS — R4189 Other symptoms and signs involving cognitive functions and awareness: Secondary | ICD-10-CM | POA: Diagnosis not present

## 2022-05-14 DIAGNOSIS — G9341 Metabolic encephalopathy: Secondary | ICD-10-CM | POA: Diagnosis not present

## 2022-05-14 DIAGNOSIS — R278 Other lack of coordination: Secondary | ICD-10-CM | POA: Diagnosis not present

## 2022-05-14 DIAGNOSIS — K810 Acute cholecystitis: Secondary | ICD-10-CM | POA: Diagnosis not present

## 2022-05-14 NOTE — Progress Notes (Deleted)
Office Visit    Patient Name: [name] Date of Encounter: 05/14/2022  Primary Care Provider:  Virgie Dad, MD Primary Cardiologist:  Rhyse Skowron [place], MD  Chief Complaint    86 year old female with a history of recurrent syncope (Stokes-Adams attack), complete heart block s/p PPM, RBBB, paroxysmal atrial fibrillation, mitral valve prolapse, hypertension, hyperlipidemia, carotid artery disease, DDD, cerebral AV malformation, complex partial seizures, memory impairment, iron deficiency anemia, and osteopenia who presents for follow-up related to heart block, hypertension, and atrial fibrillation.  Past Medical History    Past Medical History:  Diagnosis Date   Abnormal cardiovascular stress test    Carotid arterial disease (HCC)    MODERATE RIGHT   Cerebral AV malformation    DDD (degenerative disc disease)    SEVERE WITH SPINAL STENOSIS   Hypercholesterolemia    Hypertension    MVP (mitral valve prolapse)    MILD   Numbness    LEFT FOOT AND ANKLE   Osteopenia    Presence of permanent cardiac pacemaker    Pseudo-gout    Past Surgical History:  Procedure Laterality Date   APPENDECTOMY     BREAST MASS EXCISION     CARPAL TUNNEL RELEASE     CHOLECYSTECTOMY N/A 04/23/2022   Procedure: LAPAROSCOPIC CHOLECYSTECTOMY;  Surgeon: Coralie Keens, MD;  Location: [facility];  Service: General;  Laterality: N/A;   PACEMAKER IMPLANT N/A 11/07/2021   Procedure: PACEMAKER IMPLANT;  Surgeon: Evans Lance, MD;  Location: [facility];  Service: Cardiovascular;  Laterality: N/A;   REMOVAL OF PARATHYROID GLAND     STRESS NUCLEAR STUDY     ABNORMAL   TONSILLECTOMY      Allergies  Allergies  Allergen Reactions   Aleve [Naproxen] Other (See Comments)   Azithromycin Other (See Comments)    Reaction not recalled   Penicillins Hives and Other (See Comments)    Bad reaction as a child after a shot of Penicillin   Pravastatin Other (See Comments)    Reaction not recalled    Sulfa Antibiotics Other (See Comments)   Sulfa Drugs Cross Reactors Other (See Comments)    Reaction not recalled- stated she can take it "in small doses"   Tape Itching and Other (See Comments)    Cannot wear for an extended period of time   Naproxen Sodium Rash    History of Present Illness    86 year old female with the above past medical history including recurrent syncope (Stokes-Adams attack), complete heart block s/p PPM, RBBB, paroxysmal atrial fibrillation, mitral valve prolapse, hypertension, hyperlipidemia, carotid artery disease, DDD, cerebral AV malformation, complex partial seizures, memory impairment, iron deficiency anemia, and osteopenia.  She has a history of mildly abnormal Myoview study in 2010.  Not on ASA due to history of cerebral AV malformation.  She lives at [facility].  She history of recurrent syncope.  She was hospitalized in February 2022 with syncope, confusion, hyponatremia following diarrheal illness.  Carotid Dopplers in July 2022 showed 40 to 59% B ICA stenosis, stable from prior study.   She presented to the ED on 10/15/2021 with syncope.  Echocardiogram showed EF 45 to 50%, LVH, normal RV, moderately dilated left, mild MR, pericardial effusion.  Telemetry monitoring showed frequent PVCs.  Radiology and neurology were consulted.  EEG was negative for seizure activity, UA was negative for UTI.  It was mildly elevated with low suspicion for ACS.  She was hospitalized from 10/15/2021 to 10/17/2021.  She was discharged  to assisted living with outpatient 14-day live [facility] patch revealed intermittent CHB, says lasting up to 10 seconds.  She was last in the office on 11/03/2021 by Dr. Lovena Le who recommended PPM for complete heart block.  She underwent PPM implantation on 11/07/2021 with Dr. Lovena Le.  She was discharged to skilled nursing facility on 11/07/2021.  The facility notified us of low-grade fevers over the weekend following her procedure, left arm swelling.   Her staff nurse, PPM was without signs of infection. PPM remote transmission on 11/14/2021 alerted for new onset atrial fibrillation. Per Dr. Lovena Le, she was to be referred to the atrial fibrillation clinic.  When seen in Afib clinic in July she was doing well. Not a candidate for anticoagulation due to cerebral AVMs. Also not a candidate for Watchman device since she cannot take any antiplatelet or anticoagulants. She was recently admitted 8/19 with pleuritic chest pain. Ruled out for MI. Coronary CTA showed nonobstructive CAD. There was a moderate pericardial effusion noted and this was confirmed on Echo. Normal pacemaker parameters suggested no lead migration. She was treated for pericarditis with colchicine.   She was seen by Dr Lovena Le EPS in September who recommended a trial of amiodarone for her Afib. Since then she has noted persistent nausea, diarrhea and poor appetite. Was placed on Flagyl and states diarrhea is better but still doesn't feel. Well.   She was admitted  Nov 12-14 with acute cholecystitis. Underwent lap choly. Post operatively developed acute mental status changes felt be related to anesthesia and medication effects. This resolved. Was followed by Neuro.   Home Medications    Current Outpatient Medications  Medication Sig Dispense Refill   acetaminophen (TYLENOL) 500 MG tablet Take 500 mg by mouth every 6 (six) hours as needed for mild pain or headache.     B Complex Vitamins (VITAMIN-B COMPLEX) TABS Take 1 tablet by mouth daily.     Dextromethorphan-guaiFENesin (ROBAFEN DM) 10-100 MG/5ML liquid Take 10 mLs by mouth every 6 (six) hours as needed (cough/congestion).     fluticasone (FLONASE) 50 MCG/ACT nasal spray Place 1-2 sprays into both nostrils daily.     hydrALAZINE (APRESOLINE) 25 MG tablet Take 37.5 mg by mouth 3 (three) times daily.     hydrocortisone cream 1 % Apply 1 Application topically 3 (three) times daily as needed for itching. 30 g 0   loperamide (IMODIUM A-D) 2  MG tablet Take 2 mg by mouth every 4 (four) hours as needed for diarrhea or loose stools.     magnesium oxide (MAG-OX) 400 (240 Mg) MG tablet Take 1 tablet (400 mg total) by mouth daily. (Patient taking differently: Take 400 mg by mouth 3 (three) times daily.) 30 tablet 0   Multiple Vitamins-Minerals (MULTI COMPLETE/IRON) TABS Take 1 tablet by mouth daily with breakfast. Folic acid included     Nutritional Supplements (NUTRITIONAL DRINK PO) Take 1 Bottle by mouth daily. Chocolate Boost or Ensure with lunch     ondansetron (ZOFRAN-ODT) 4 MG disintegrating tablet Take 4 mg by mouth every 4 (four) hours as needed for nausea or vomiting.     PHENobarbital (LUMINAL) 64.8 MG tablet Take 1 tablet (64.8 mg total) by mouth in the morning. 30 tablet 5   phenytoin (DILANTIN) 100 MG ER capsule Take 200 mg by mouth daily with breakfast.     Polyethyl Glycol-Propyl Glycol (SYSTANE ULTRA) 0.4-0.3 % SOLN Place 1 drop into both eyes in the morning and at bedtime.     Probiotic Product (ALIGN) 4  MG CAPS Take 4 mg by mouth daily.     valsartan (DIOVAN) 160 MG tablet Take 1 tablet (160 mg total) by mouth daily. 90 tablet 30   No current facility-administered medications for this visit.     Review of Systems    She denies chest pain, palpitations, dyspnea, pnd, orthopnea, n, v, dizziness, syncope, edema, weight gain, or early satiety. All other systems reviewed and are otherwise negative except as noted above.   Physical Exam    VS:  There were no vitals taken for this visit.  GEN: Well nourished, well developed, in no acute distress. HEENT: normal. Neck: Supple, no JVD, bilateral carotid bruits, no masses. Cardiac: RRR, no murmurs, rubs, or gallops. No clubbing, cyanosis, edema.  Radials/DP/PT 2+ and equal bilaterally.  Respiratory:  Respirations regular and unlabored, clear to auscultation bilaterally. GI: Soft, nontender, nondistended, BS + x 4. MS: no deformity or atrophy. Skin: warm and dry, no  rash. Neuro:  Strength and sensation are intact. Psych: Normal affect.  Accessory Clinical Findings    ECG is not done today.  Lab Results  Component Value Date   WBC 5.7 04/26/2022   HGB 10.2 (L) 04/26/2022   HCT 32.4 (L) 04/26/2022   MCV 106.2 (H) 04/26/2022   PLT 283 04/26/2022   Lab Results  Component Value Date   CREATININE 1.07 (H) 04/29/2022   BUN 27 (H) 04/26/2022   NA 136 04/26/2022   K 3.8 04/26/2022   CL 105 04/26/2022   CO2 21 (L) 04/26/2022   Lab Results  Component Value Date   ALT 12 04/26/2022   AST 18 04/26/2022   ALKPHOS 54 04/26/2022   BILITOT 0.9 04/26/2022   Lab Results  Component Value Date   CHOL 208 (H) 01/28/2022   HDL 80 01/28/2022   LDLCALC 116 (H) 01/28/2022   TRIG 59 01/28/2022   CHOLHDL 2.6 01/28/2022    Lab Results  Component Value Date   HGBA1C 4.9 04/22/2022     Coronary CTA 01/27/22   __Coronary Arteries:  Normal coronary origin.  Right dominance.   RCA is a large dominant artery that gives rise to PDA and PLA. Calcified plaque in ostial RCA causes 0-24% stenosis. Calcified plaque in proximal RCA causes 25-49% stenosis.   Left main is a large artery that gives rise to LAD and LCX arteries. Calcified plaque in ostial left main causes 0-24% stenosis   LAD is a large vessel. Calcified plaque in proximal LAD causes 0-24% stenosis.   LCX is a non-dominant artery that gives rise to one large OM1 branch. There is no plaque.   Other findings:   Left Ventricle: Normal size   Left Atrium: Mild enlargement   Pulmonary Veins: Normal configuration   Right Ventricle: Normal size   Right Atrium: Normal size.  Dual chamber pacemaker   Thoracic aorta: Normal size   Pulmonary Arteries: Normal size   Systemic Veins: Normal drainage   Pericardium: Moderate pericardial effusion, primarily located adjacent to the posterior/lateral walls of the LV   IMPRESSION: 1. Technically difficult study due to multiple PVCs  during acquisition. With ECG editing study is largely interpretable, though distal RCA remains uninterpretable   2. Coronary calcium score of 329. This was 67th percentile for age and sex matched control.   3. Normal coronary origin with right dominance.   4. Nonobstructive CAD   5. Calcified plaque in proximal RCA causes mild (25-49%) stenosis.   6. Calcified plaque causes minimal (0-24%) stenosis in ostial  left main, ostial RCA, and proximal LAD   7. Moderate pericardial effusion, primarily located adjacent to the posterior/lateral walls of the LV   OVER Read by Radiologist      IMPRESSION: Cardiomegaly.  Small pericardial effusion.   Left lower lobe atelectasis or scarring.   Aortic atherosclerosis.     Echo 01/28/22 IMPRESSIONS     1. Left ventricular ejection fraction, by estimation, is 50 to 55%. The  left ventricle has low normal function. The left ventricle has no regional  wall motion abnormalities. Indeterminate diastolic filling due to E-A  fusion.   2. Right ventricular systolic function is normal. The right ventricular  size is normal. There is mildly elevated pulmonary artery systolic  pressure.   3. Left atrial size was mildly dilated.   4. Right atrial size was mildly dilated.   5. Moderate pericardial effusion. The pericardial effusion is  circumferential. There is no evidence of cardiac tamponade.   6. The mitral valve is normal in structure. Mild mitral valve  regurgitation.   7. The aortic valve is grossly normal. Aortic valve regurgitation is not  visualized. No aortic stenosis is present.   8. The inferior vena cava is normal in size with greater than 50%  respiratory variability, suggesting right atrial pressure of 3 mmHg.   Comparison(s): Prior images reviewed side by side. The pericardial  effusion is larger.   FINDINGS   Left Ventricle: Left ventricular ejection fraction, by estimation, is 50  to 55%. The left ventricle has low normal  function. The left ventricle has  no regional wall motion abnormalities. The left ventricular internal  cavity size was normal in size.  There is no left ventricular hypertrophy. Abnormal (paradoxical) septal  motion, consistent with RV pacemaker. Indeterminate diastolic filling due  to E-A fusion.   Right Ventricle: The right ventricular size is normal. No increase in  right ventricular wall thickness. Right ventricular systolic function is  normal. There is mildly elevated pulmonary artery systolic pressure. The  tricuspid regurgitant velocity is 3.04   m/s, and with an assumed right atrial pressure of 3 mmHg, the estimated  right ventricular systolic pressure is 93.8 mmHg.   Left Atrium: Left atrial size was mildly dilated.   Right Atrium: Right atrial size was mildly dilated.   Pericardium: A moderately sized pericardial effusion is present. The  pericardial effusion is circumferential. There is no evidence of cardiac  tamponade.   Mitral Valve: The mitral valve is normal in structure. Mild mitral valve  regurgitation. MV peak gradient, 15.2 mmHg. The mean mitral valve gradient  is 4.0 mmHg.   Tricuspid Valve: The tricuspid valve is normal in structure. Tricuspid  valve regurgitation is not demonstrated.   Aortic Valve: The aortic valve is grossly normal. Aortic valve  regurgitation is not visualized. No aortic stenosis is present.   Pulmonic Valve: The pulmonic valve was normal in structure. Pulmonic valve  regurgitation is trivial.   Aorta: The aortic root is normal in size and structure.   Venous: The inferior vena cava is normal in size with greater than 50%  respiratory variability, suggesting right atrial pressure of 3 mmHg.   IAS/Shunts: No atrial level shunt detected by color flow Doppler.   ___________   Echo 02/13/22: IMPRESSIONS     1. Moderate pericardial effusion. Unchanged from prior study. Moderate  pericardial effusion. The pericardial effusion is  circumferential. There  is no evidence of cardiac tamponade.   2. Left ventricular ejection fraction, by estimation, is  55 to 60%. The  left ventricle has normal function. The left ventricle has no regional  wall motion abnormalities.   3. Right ventricular systolic function is normal. The right ventricular  size is normal. Tricuspid regurgitation signal is inadequate for assessing  PA pressure.   4. The mitral valve is myxomatous. Mild mitral valve regurgitation.   5. The inferior vena cava is normal in size with greater than 50%  respiratory variability, suggesting right atrial pressure of 3 mmHg.   Comparison(s): No significant change from prior study. 01/28/22 EF 50-55%.  Moderate pericardial effusion.    Assessment & Plan    1. Recurrent syncope/CHB: S/p PPM 11/07/2021.  She denies any recurrent syncope.  Follow up with EP  2.  Paroxysmal  atrial fibrillation: patient's last device check demonstrated an Afib burden of about 60%. Rate controlled with Afib. She is completely asymptomatic. Was placed on amiodarone about 12 days ago which correlates with onset of her GI symptoms. Will stop amiodarone. Continue with rate control strategy. Unfortunately, she is not a candidate for DOAC at this time given history of cerebral AV malformation.  Also not a candidate for Watchman device.   3. Acute pericarditis: moderate pericardial effusion noted on CT and Echo. Repeat Echo on September 5 showed no change in moderate effusion. No evidence of tamponade. Will plan on repeating Echo in 6 months unless there is a change in clinical status. She is having no chest pain now.   5. Carotid artery disease: Carotid Dopplers in July 2022 showed 40 to 59% B ICA stenosis, stable from prior study.  Bilateral carotid bruits on exam.  Asymptomatic.  No indication for repeat study at this time.  6. Hypertension: BP is now well controlled.   7. Hyperlipidemia: LDL was 116 Continue simvastatin.  8. Anemia: CBC  was 9.9 in June 2023.  Monitored and managed per PCP.  9. Partial symptomatic epilepsy with complex partial seizures: Managed on Dilantin, and phenobarbital.  Stable.  10. S/p choly for cholecystitis.    Shanyah Gattuso [place], MD,FACC 05/14/2022, 5:29 PM

## 2022-05-15 ENCOUNTER — Encounter: Payer: Self-pay | Admitting: Cardiology

## 2022-05-15 DIAGNOSIS — M62562 Muscle wasting and atrophy, not elsewhere classified, left lower leg: Secondary | ICD-10-CM | POA: Diagnosis not present

## 2022-05-15 DIAGNOSIS — R4189 Other symptoms and signs involving cognitive functions and awareness: Secondary | ICD-10-CM | POA: Diagnosis not present

## 2022-05-15 DIAGNOSIS — K810 Acute cholecystitis: Secondary | ICD-10-CM | POA: Diagnosis not present

## 2022-05-15 DIAGNOSIS — R278 Other lack of coordination: Secondary | ICD-10-CM | POA: Diagnosis not present

## 2022-05-15 DIAGNOSIS — M6389 Disorders of muscle in diseases classified elsewhere, multiple sites: Secondary | ICD-10-CM | POA: Diagnosis not present

## 2022-05-15 DIAGNOSIS — M62561 Muscle wasting and atrophy, not elsewhere classified, right lower leg: Secondary | ICD-10-CM | POA: Diagnosis not present

## 2022-05-15 DIAGNOSIS — G9341 Metabolic encephalopathy: Secondary | ICD-10-CM | POA: Diagnosis not present

## 2022-05-15 DIAGNOSIS — R2689 Other abnormalities of gait and mobility: Secondary | ICD-10-CM | POA: Diagnosis not present

## 2022-05-15 NOTE — Telephone Encounter (Signed)
Error

## 2022-05-16 ENCOUNTER — Ambulatory Visit: Payer: Medicare Other | Admitting: Cardiology

## 2022-05-16 DIAGNOSIS — M6389 Disorders of muscle in diseases classified elsewhere, multiple sites: Secondary | ICD-10-CM | POA: Diagnosis not present

## 2022-05-16 DIAGNOSIS — M62562 Muscle wasting and atrophy, not elsewhere classified, left lower leg: Secondary | ICD-10-CM | POA: Diagnosis not present

## 2022-05-16 DIAGNOSIS — R278 Other lack of coordination: Secondary | ICD-10-CM | POA: Diagnosis not present

## 2022-05-16 DIAGNOSIS — R4189 Other symptoms and signs involving cognitive functions and awareness: Secondary | ICD-10-CM | POA: Diagnosis not present

## 2022-05-16 DIAGNOSIS — R2689 Other abnormalities of gait and mobility: Secondary | ICD-10-CM | POA: Diagnosis not present

## 2022-05-16 DIAGNOSIS — M62561 Muscle wasting and atrophy, not elsewhere classified, right lower leg: Secondary | ICD-10-CM | POA: Diagnosis not present

## 2022-05-16 DIAGNOSIS — K810 Acute cholecystitis: Secondary | ICD-10-CM | POA: Diagnosis not present

## 2022-05-16 DIAGNOSIS — G9341 Metabolic encephalopathy: Secondary | ICD-10-CM | POA: Diagnosis not present

## 2022-05-17 DIAGNOSIS — R278 Other lack of coordination: Secondary | ICD-10-CM | POA: Diagnosis not present

## 2022-05-17 DIAGNOSIS — M6389 Disorders of muscle in diseases classified elsewhere, multiple sites: Secondary | ICD-10-CM | POA: Diagnosis not present

## 2022-05-17 DIAGNOSIS — R4189 Other symptoms and signs involving cognitive functions and awareness: Secondary | ICD-10-CM | POA: Diagnosis not present

## 2022-05-17 DIAGNOSIS — M62561 Muscle wasting and atrophy, not elsewhere classified, right lower leg: Secondary | ICD-10-CM | POA: Diagnosis not present

## 2022-05-17 DIAGNOSIS — R2689 Other abnormalities of gait and mobility: Secondary | ICD-10-CM | POA: Diagnosis not present

## 2022-05-17 DIAGNOSIS — K810 Acute cholecystitis: Secondary | ICD-10-CM | POA: Diagnosis not present

## 2022-05-17 DIAGNOSIS — M62562 Muscle wasting and atrophy, not elsewhere classified, left lower leg: Secondary | ICD-10-CM | POA: Diagnosis not present

## 2022-05-17 DIAGNOSIS — G9341 Metabolic encephalopathy: Secondary | ICD-10-CM | POA: Diagnosis not present

## 2022-05-18 DIAGNOSIS — M62561 Muscle wasting and atrophy, not elsewhere classified, right lower leg: Secondary | ICD-10-CM | POA: Diagnosis not present

## 2022-05-18 DIAGNOSIS — K810 Acute cholecystitis: Secondary | ICD-10-CM | POA: Diagnosis not present

## 2022-05-18 DIAGNOSIS — G9341 Metabolic encephalopathy: Secondary | ICD-10-CM | POA: Diagnosis not present

## 2022-05-18 DIAGNOSIS — M62562 Muscle wasting and atrophy, not elsewhere classified, left lower leg: Secondary | ICD-10-CM | POA: Diagnosis not present

## 2022-05-18 DIAGNOSIS — R4189 Other symptoms and signs involving cognitive functions and awareness: Secondary | ICD-10-CM | POA: Diagnosis not present

## 2022-05-18 DIAGNOSIS — R2689 Other abnormalities of gait and mobility: Secondary | ICD-10-CM | POA: Diagnosis not present

## 2022-05-18 DIAGNOSIS — R278 Other lack of coordination: Secondary | ICD-10-CM | POA: Diagnosis not present

## 2022-05-18 DIAGNOSIS — M6389 Disorders of muscle in diseases classified elsewhere, multiple sites: Secondary | ICD-10-CM | POA: Diagnosis not present

## 2022-05-21 DIAGNOSIS — M62561 Muscle wasting and atrophy, not elsewhere classified, right lower leg: Secondary | ICD-10-CM | POA: Diagnosis not present

## 2022-05-21 DIAGNOSIS — R2689 Other abnormalities of gait and mobility: Secondary | ICD-10-CM | POA: Diagnosis not present

## 2022-05-21 DIAGNOSIS — M6389 Disorders of muscle in diseases classified elsewhere, multiple sites: Secondary | ICD-10-CM | POA: Diagnosis not present

## 2022-05-21 DIAGNOSIS — M62562 Muscle wasting and atrophy, not elsewhere classified, left lower leg: Secondary | ICD-10-CM | POA: Diagnosis not present

## 2022-05-21 DIAGNOSIS — R278 Other lack of coordination: Secondary | ICD-10-CM | POA: Diagnosis not present

## 2022-05-21 DIAGNOSIS — G9341 Metabolic encephalopathy: Secondary | ICD-10-CM | POA: Diagnosis not present

## 2022-05-21 DIAGNOSIS — R4189 Other symptoms and signs involving cognitive functions and awareness: Secondary | ICD-10-CM | POA: Diagnosis not present

## 2022-05-21 DIAGNOSIS — K810 Acute cholecystitis: Secondary | ICD-10-CM | POA: Diagnosis not present

## 2022-05-22 DIAGNOSIS — R4189 Other symptoms and signs involving cognitive functions and awareness: Secondary | ICD-10-CM | POA: Diagnosis not present

## 2022-05-22 DIAGNOSIS — G9341 Metabolic encephalopathy: Secondary | ICD-10-CM | POA: Diagnosis not present

## 2022-05-22 DIAGNOSIS — M62561 Muscle wasting and atrophy, not elsewhere classified, right lower leg: Secondary | ICD-10-CM | POA: Diagnosis not present

## 2022-05-22 DIAGNOSIS — M62562 Muscle wasting and atrophy, not elsewhere classified, left lower leg: Secondary | ICD-10-CM | POA: Diagnosis not present

## 2022-05-22 DIAGNOSIS — K810 Acute cholecystitis: Secondary | ICD-10-CM | POA: Diagnosis not present

## 2022-05-22 DIAGNOSIS — M6389 Disorders of muscle in diseases classified elsewhere, multiple sites: Secondary | ICD-10-CM | POA: Diagnosis not present

## 2022-05-22 DIAGNOSIS — R278 Other lack of coordination: Secondary | ICD-10-CM | POA: Diagnosis not present

## 2022-05-22 DIAGNOSIS — R2689 Other abnormalities of gait and mobility: Secondary | ICD-10-CM | POA: Diagnosis not present

## 2022-05-23 DIAGNOSIS — R278 Other lack of coordination: Secondary | ICD-10-CM | POA: Diagnosis not present

## 2022-05-23 DIAGNOSIS — M62562 Muscle wasting and atrophy, not elsewhere classified, left lower leg: Secondary | ICD-10-CM | POA: Diagnosis not present

## 2022-05-23 DIAGNOSIS — K810 Acute cholecystitis: Secondary | ICD-10-CM | POA: Diagnosis not present

## 2022-05-23 DIAGNOSIS — R4189 Other symptoms and signs involving cognitive functions and awareness: Secondary | ICD-10-CM | POA: Diagnosis not present

## 2022-05-23 DIAGNOSIS — M62561 Muscle wasting and atrophy, not elsewhere classified, right lower leg: Secondary | ICD-10-CM | POA: Diagnosis not present

## 2022-05-23 DIAGNOSIS — M6389 Disorders of muscle in diseases classified elsewhere, multiple sites: Secondary | ICD-10-CM | POA: Diagnosis not present

## 2022-05-23 DIAGNOSIS — G9341 Metabolic encephalopathy: Secondary | ICD-10-CM | POA: Diagnosis not present

## 2022-05-23 DIAGNOSIS — R2689 Other abnormalities of gait and mobility: Secondary | ICD-10-CM | POA: Diagnosis not present

## 2022-05-24 DIAGNOSIS — M62561 Muscle wasting and atrophy, not elsewhere classified, right lower leg: Secondary | ICD-10-CM | POA: Diagnosis not present

## 2022-05-24 DIAGNOSIS — R278 Other lack of coordination: Secondary | ICD-10-CM | POA: Diagnosis not present

## 2022-05-24 DIAGNOSIS — G9341 Metabolic encephalopathy: Secondary | ICD-10-CM | POA: Diagnosis not present

## 2022-05-24 DIAGNOSIS — M6389 Disorders of muscle in diseases classified elsewhere, multiple sites: Secondary | ICD-10-CM | POA: Diagnosis not present

## 2022-05-24 DIAGNOSIS — R4189 Other symptoms and signs involving cognitive functions and awareness: Secondary | ICD-10-CM | POA: Diagnosis not present

## 2022-05-24 DIAGNOSIS — R2689 Other abnormalities of gait and mobility: Secondary | ICD-10-CM | POA: Diagnosis not present

## 2022-05-24 DIAGNOSIS — M62562 Muscle wasting and atrophy, not elsewhere classified, left lower leg: Secondary | ICD-10-CM | POA: Diagnosis not present

## 2022-05-24 DIAGNOSIS — K810 Acute cholecystitis: Secondary | ICD-10-CM | POA: Diagnosis not present

## 2022-05-25 DIAGNOSIS — R4189 Other symptoms and signs involving cognitive functions and awareness: Secondary | ICD-10-CM | POA: Diagnosis not present

## 2022-05-25 DIAGNOSIS — K810 Acute cholecystitis: Secondary | ICD-10-CM | POA: Diagnosis not present

## 2022-05-25 DIAGNOSIS — M62562 Muscle wasting and atrophy, not elsewhere classified, left lower leg: Secondary | ICD-10-CM | POA: Diagnosis not present

## 2022-05-25 DIAGNOSIS — R278 Other lack of coordination: Secondary | ICD-10-CM | POA: Diagnosis not present

## 2022-05-25 DIAGNOSIS — M62561 Muscle wasting and atrophy, not elsewhere classified, right lower leg: Secondary | ICD-10-CM | POA: Diagnosis not present

## 2022-05-25 DIAGNOSIS — G9341 Metabolic encephalopathy: Secondary | ICD-10-CM | POA: Diagnosis not present

## 2022-05-25 DIAGNOSIS — R2689 Other abnormalities of gait and mobility: Secondary | ICD-10-CM | POA: Diagnosis not present

## 2022-05-25 DIAGNOSIS — M6389 Disorders of muscle in diseases classified elsewhere, multiple sites: Secondary | ICD-10-CM | POA: Diagnosis not present

## 2022-05-28 ENCOUNTER — Non-Acute Institutional Stay (SKILLED_NURSING_FACILITY): Payer: Medicare Other | Admitting: Adult Health

## 2022-05-28 DIAGNOSIS — R601 Generalized edema: Secondary | ICD-10-CM | POA: Diagnosis not present

## 2022-05-28 DIAGNOSIS — I10 Essential (primary) hypertension: Secondary | ICD-10-CM

## 2022-05-28 DIAGNOSIS — R195 Other fecal abnormalities: Secondary | ICD-10-CM

## 2022-05-28 DIAGNOSIS — R0902 Hypoxemia: Secondary | ICD-10-CM | POA: Diagnosis not present

## 2022-05-28 MED ORDER — HYDRALAZINE HCL 50 MG PO TABS: 50.0000 mg | ORAL_TABLET | Freq: Three times a day (TID) | ORAL | 0 refills | Status: DC

## 2022-05-28 NOTE — Progress Notes (Addendum)
Location:  [facility]up[facility] psychologist of Service:  SNF (31) Provider:   Cindi Little, [place] [phone]   Kelsey Dad, MD  Patient Care Team: Kelsey Dad, MD as PCP - General (Internal Medicine) [place], [name], MD as PCP - Cardiology (Cardiology)  Extended Emergency Contact Information Primary Emergency Contact: [name] Mobile Phone: [phone] Relation: Daughter Secondary Emergency Contact: [name] Mobile Phone: [phone] Relation: Son  Code Status:  DNR Goals of care: Advanced Directive information    05/01/2022   10:04 AM  Advanced Directives  Does Patient Have a Medical Advance Directive? Yes  Type of Paramedic of [place];Living will;Out of facility DNR (pink MOST or yellow form)  Does patient want to make changes to medical advance directive? No - Patient declined  Copy of [place] in Chart? Yes - validated most recent copy scanned in chart (See row information)  Pre-existing out of facility DNR order (yellow form or pink MOST form) Yellow form placed in chart (order not valid for inpatient use)     Chief Complaint  Patient presents with   Acute Visit    Cough     HPI:  Pt is a 86 y.o. female seen today for an acute visit for cough.  She currently resides on the rehabilitation unit at [facility]. PMH: aortic atherosclerosis, cerebral AV malformation, HTN, HLD, Heart block AV-s/p PPM 05/30, HTN, MVP, afib, epilepsy, encephalopathy, neuropathy, anemia, and unstable gait.    She is s/p lap chole 04/23/22 and had and post op seizure.   Resident has a productive cough for two days. No fever. Sats were 87-89% improved to 95% with 2 liters of oxygen. She denies any sob or chest pain.  She did have a bout of nausea one day ago which subsided with zofran. Reports that since having her gallbladder removed she has loose stools. The stools are not frequent, 1 per  day.   She is on mag three times a day due to low levels, last level 1.7.  Weight trending upward, has chronic edema in her legs and wears hose Staff noticed more fluid around the abd area and a pocket of fluid on the left flank area. Was on lasix but it was discontinued 11/20 due to dry volume status. Wt Readings from Last 3 Encounters:  05/28/22 145 lb (65.8 kg)  05/09/22 144 lb 3.2 oz (65.4 kg)  05/01/22 137 lb 4 oz (62.3 kg)   BP running high at times. Up to 180s    Past Medical History:  Diagnosis Date   Abnormal cardiovascular stress test    Carotid arterial disease (HCC)    MODERATE RIGHT   Cerebral AV malformation    DDD (degenerative disc disease)    SEVERE WITH SPINAL STENOSIS   Hypercholesterolemia    Hypertension    MVP (mitral valve prolapse)    MILD   Numbness    LEFT FOOT AND ANKLE   Osteopenia    Presence of permanent cardiac pacemaker    Pseudo-gout    Past Surgical History:  Procedure Laterality Date   APPENDECTOMY     BREAST MASS EXCISION     CARPAL TUNNEL RELEASE     CHOLECYSTECTOMY N/A 04/23/2022   Procedure: LAPAROSCOPIC CHOLECYSTECTOMY;  Surgeon: Kelsey Keens, MD;  Location: [facility];  Service: General;  Laterality: N/A;   PACEMAKER IMPLANT N/A 11/07/2021   Procedure: PACEMAKER IMPLANT;  Surgeon: Kelsey Lance, MD;  Location: [facility]  INVASIVE CV LAB;  Service: Cardiovascular;  Laterality: N/A;   REMOVAL OF PARATHYROID GLAND     STRESS NUCLEAR STUDY     ABNORMAL   TONSILLECTOMY      Allergies  Allergen Reactions   Aleve [Naproxen] Other (See Comments)   Azithromycin Other (See Comments)    Reaction not recalled   Penicillins Hives and Other (See Comments)    Bad reaction as a child after a shot of Penicillin   Pravastatin Other (See Comments)    Reaction not recalled   Sulfa Antibiotics Other (See Comments)   Sulfa Drugs Cross Reactors Other (See Comments)    Reaction not recalled- stated she can take it "in small doses"   Tape Itching  and Other (See Comments)    Cannot wear for an extended period of time   Naproxen Sodium Rash    Outpatient Encounter Medications as of 05/28/2022  Medication Sig   acetaminophen (TYLENOL) 500 MG tablet Take 500 mg by mouth every 6 (six) hours as needed for mild pain or headache.   B Complex Vitamins (VITAMIN-B COMPLEX) TABS Take 1 tablet by mouth daily.   Dextromethorphan-guaiFENesin (ROBAFEN DM) 10-100 MG/5ML liquid Take 10 mLs by mouth every 6 (six) hours as needed (cough/congestion).   fluticasone (FLONASE) 50 MCG/ACT nasal spray Place 1-2 sprays into both nostrils daily.   hydrALAZINE (APRESOLINE) 25 MG tablet Take 37.5 mg by mouth 3 (three) times daily.   hydrocortisone cream 1 % Apply 1 Application topically 3 (three) times daily as needed for itching.   loperamide (IMODIUM A-D) 2 MG tablet Take 2 mg by mouth every 4 (four) hours as needed for diarrhea or loose stools.   Multiple Vitamins-Minerals (MULTI COMPLETE/IRON) TABS Take 1 tablet by mouth daily with breakfast. Folic acid included   Nutritional Supplements (NUTRITIONAL DRINK PO) Take 1 Bottle by mouth daily. Chocolate Boost or Ensure with lunch   ondansetron (ZOFRAN-ODT) 4 MG disintegrating tablet Take 4 mg by mouth every 4 (four) hours as needed for nausea or vomiting.   PHENobarbital (LUMINAL) 64.8 MG tablet Take 1 tablet (64.8 mg total) by mouth in the morning.   phenytoin (DILANTIN) 100 MG ER capsule Take 200 mg by mouth daily with breakfast.   Polyethyl Glycol-Propyl Glycol (SYSTANE ULTRA) 0.4-0.3 % SOLN Place 1 drop into both eyes in the morning and at bedtime.   Probiotic Product (ALIGN) 4 MG CAPS Take 4 mg by mouth daily.   valsartan (DIOVAN) 160 MG tablet Take 1 tablet (160 mg total) by mouth daily.   No facility-administered encounter medications on file as of 05/28/2022.    Review of Systems  Immunization History  Administered Date(s) Administered   Influenza Split 03/28/2009, 03/17/2010, 03/26/2011, 04/01/2012,  03/05/2013   Influenza, Quadrivalent, Recombinant, Inj, Pf 03/08/2018, 02/13/2019, 02/24/2020   Influenza,inj,Quad PF,6+ Mos 02/25/2014, 03/05/2015   Influenza-Unspecified 02/09/2014, 02/25/2016, 04/06/2017, 04/25/2021   Moderna Sars-Covid-2 Vaccination 07/17/2019, 08/14/2019, 02/24/2020, 04/26/2020   Pneumococcal Conjugate-13 11/26/2014   Pneumococcal-Unspecified 06/23/2003, 11/10/2012   Td (Adult), 2 Lf Tetanus Toxid, Preservative Free 06/11/2000, 11/06/2011   Unspecified SARS-COV-2 Vaccination 04/28/2021   Zoster Recombinat (Shingrix) 01/09/2019, 05/08/2019   Zoster, Live 01/15/2006, 01/15/2019   Pertinent  Health Maintenance Due  Topic Date Due   DEXA SCAN  Never done   INFLUENZA VACCINE  01/09/2022      04/28/2022    8:00 AM 04/28/2022   10:00 PM 04/29/2022    8:00 AM 04/29/2022    8:10 PM 05/01/2022    9:41 AM  Fall Risk  Falls in the past year?     0  Was there an injury with Fall?     0  Fall Risk Category Calculator     0  Fall Risk Category     Low  Patient Fall Risk Level High fall risk High fall risk High fall risk High fall risk High fall risk  Patient at Risk for Falls Due to     Impaired balance/gait;Impaired mobility  Fall risk Follow up     Falls evaluation completed   Functional Status Survey:    Vitals:   05/28/22 0953  BP: (!) 160/61  Pulse: 64  Temp: 98.4 F (36.9 C)  Weight: 145 lb (65.8 kg)   Body mass index is 24.13 kg/Little. Physical Exam Vitals and nursing note reviewed.  Constitutional:      General: She is not in acute distress.    Appearance: She is not diaphoretic.  HENT:     Head: Normocephalic and atraumatic.     Nose: No congestion.     Mouth/Throat:     Mouth: Mucous membranes are moist.     Pharynx: Oropharynx is clear. No oropharyngeal exudate or posterior oropharyngeal erythema.  Eyes:     Conjunctiva/sclera: Conjunctivae normal.     Pupils: Pupils are equal, round, and reactive to light.  Neck:     Thyroid: No thyromegaly.      Vascular: No carotid bruit or JVD.  Cardiovascular:     Rate and Rhythm: Normal rate. Rhythm irregular.     Heart sounds: Normal heart sounds. No murmur heard. Pulmonary:     Effort: Pulmonary effort is normal. No respiratory distress.     Breath sounds: No stridor. Rales present.     Comments: Decreased bases  Abdominal:     General: Bowel sounds are normal.     Palpations: Abdomen is soft.  Musculoskeletal:     Cervical back: No rigidity. No muscular tenderness.     Comments: BLE edema +2 Fluid around flanks L>R  Lymphadenopathy:     Cervical: No cervical adenopathy.  Skin:    General: Skin is warm and dry.  Neurological:     General: No focal deficit present.     Mental Status: She is alert and oriented to person, place, and time. Mental status is at baseline.     Cranial Nerves: No cranial nerve deficit.  Psychiatric:        Mood and Affect: Mood normal.     Labs reviewed: Recent Labs    04/22/22 1826 04/23/22 0811 04/24/22 0504 04/25/22 0735 04/26/22 0901 04/29/22 0436  NA  --    < > 136 135 136  --   K  --    < > 3.7 3.7 3.8  --   CL  --    < > 103 103 105  --   CO2  --    < > 22 19* 21*  --   GLUCOSE  --    < > 91 89 98  --   BUN  --    < > 29* 29* 27*  --   CREATININE  --    < > 1.14* 1.32* 1.24* 1.07*  CALCIUM 8.1*   < > 8.1* 8.4* 8.6*  --   MG 1.1*  --  1.8 1.9  --   --   PHOS  --   --  4.4 3.5  --   --    < > = values in  this interval not displayed.   Recent Labs    04/24/22 0504 04/25/22 0735 04/26/22 0901  AST '24 21 18  '$ ALT '16 15 12  '$ ALKPHOS 52 53 54  BILITOT 0.7 1.1 0.9  PROT 5.8* 5.9* 5.9*  ALBUMIN 2.8* 2.8* 2.6*   Recent Labs    04/22/22 0434 04/23/22 0811 04/24/22 0504 04/25/22 0735 04/26/22 0901  WBC 13.3*   < > 11.3* 8.9 5.7  NEUTROABS 11.9*  --  10.0* 7.6  --   HGB 10.2*   < > 9.1* 9.5* 10.2*  HCT 31.7*   < > 28.1* 29.3* 32.4*  MCV 104.3*   < > 104.5* 103.5* 106.2*  PLT 288   < > 237 274 283   < > = values in this  interval not displayed.   Lab Results  Component Value Date   TSH 4.325 04/25/2022   Lab Results  Component Value Date   HGBA1C 4.9 04/22/2022   Lab Results  Component Value Date   CHOL 208 (H) 01/28/2022   HDL 80 01/28/2022   LDLCALC 116 (H) 01/28/2022   TRIG 59 01/28/2022   CHOLHDL 2.6 01/28/2022    Significant Diagnostic Results in last 30 days:  CUP PACEART REMOTE DEVICE CHECK  Result Date: 05/10/2022 Scheduled alert remote reviewed. Normal device function.  Alert: low atrial lead impedance unipolar, the patient is programmed bipolar and the lead impedance is in normal range.  Ongoing AF, OAC contraindicated according to previous reports, AF burden is 96.5% of the time, sent to triage for significant increase in burden Next remote 91 days and in-clinic 06/29/2022 Kathy Breach, RN, CCDS, [employer]   Assessment/Plan  1. Hypoxia Neg flu and covid Oxygen 2 liters titrate for sat >/=90% Check CXR appears volume overloaded.   2. Generalized edema Lasix 20 mg x 1 Kdur 10 meq x 1 May need more lasix, waiting on xray   3. Essential hypertension Increase hydralazine to 50 mg tid.   4. Hypomagnesemia Level borderline on three times daily dosing, will leave the same, could contribute to loose stools.   5. Loose stools One time per day Will continue to monitor, if increase frequency consider [place]   Family/ staff Communication: nurse and resident   Labs/tests ordered:  CBC and BMP in am

## 2022-05-29 DIAGNOSIS — R278 Other lack of coordination: Secondary | ICD-10-CM | POA: Diagnosis not present

## 2022-05-29 DIAGNOSIS — J9601 Acute respiratory failure with hypoxia: Secondary | ICD-10-CM | POA: Diagnosis not present

## 2022-05-29 DIAGNOSIS — M62562 Muscle wasting and atrophy, not elsewhere classified, left lower leg: Secondary | ICD-10-CM | POA: Diagnosis not present

## 2022-05-29 DIAGNOSIS — K810 Acute cholecystitis: Secondary | ICD-10-CM | POA: Diagnosis not present

## 2022-05-29 DIAGNOSIS — R0902 Hypoxemia: Secondary | ICD-10-CM | POA: Diagnosis not present

## 2022-05-29 DIAGNOSIS — R2689 Other abnormalities of gait and mobility: Secondary | ICD-10-CM | POA: Diagnosis not present

## 2022-05-29 DIAGNOSIS — M62561 Muscle wasting and atrophy, not elsewhere classified, right lower leg: Secondary | ICD-10-CM | POA: Diagnosis not present

## 2022-05-29 DIAGNOSIS — R4189 Other symptoms and signs involving cognitive functions and awareness: Secondary | ICD-10-CM | POA: Diagnosis not present

## 2022-05-29 DIAGNOSIS — G9341 Metabolic encephalopathy: Secondary | ICD-10-CM | POA: Diagnosis not present

## 2022-05-29 DIAGNOSIS — M6389 Disorders of muscle in diseases classified elsewhere, multiple sites: Secondary | ICD-10-CM | POA: Diagnosis not present

## 2022-05-29 LAB — BASIC METABOLIC PANEL
BUN: 22 — AB (ref 4–21)
CO2: 28 — AB (ref 13–22)
Chloride: 101 (ref 99–108)
Creatinine: 0.9 (ref 0.5–1.1)
Glucose: 96
Potassium: 4 mEq/L (ref 3.5–5.1)
Sodium: 138 (ref 137–147)

## 2022-05-29 LAB — CBC AND DIFFERENTIAL
HCT: 29 — AB (ref 36–46)
Hemoglobin: 9.7 — AB (ref 12.0–16.0)
Platelets: 221 10*3/uL (ref 150–400)
WBC: 4.8

## 2022-05-29 LAB — COMPREHENSIVE METABOLIC PANEL: Calcium: 9.2 (ref 8.7–10.7)

## 2022-05-29 LAB — CBC: RBC: 2.77 — AB (ref 3.87–5.11)

## 2022-05-31 DIAGNOSIS — G9341 Metabolic encephalopathy: Secondary | ICD-10-CM | POA: Diagnosis not present

## 2022-05-31 DIAGNOSIS — M6389 Disorders of muscle in diseases classified elsewhere, multiple sites: Secondary | ICD-10-CM | POA: Diagnosis not present

## 2022-05-31 DIAGNOSIS — M62561 Muscle wasting and atrophy, not elsewhere classified, right lower leg: Secondary | ICD-10-CM | POA: Diagnosis not present

## 2022-05-31 DIAGNOSIS — R2689 Other abnormalities of gait and mobility: Secondary | ICD-10-CM | POA: Diagnosis not present

## 2022-05-31 DIAGNOSIS — R4189 Other symptoms and signs involving cognitive functions and awareness: Secondary | ICD-10-CM | POA: Diagnosis not present

## 2022-05-31 DIAGNOSIS — K810 Acute cholecystitis: Secondary | ICD-10-CM | POA: Diagnosis not present

## 2022-05-31 DIAGNOSIS — M62562 Muscle wasting and atrophy, not elsewhere classified, left lower leg: Secondary | ICD-10-CM | POA: Diagnosis not present

## 2022-05-31 DIAGNOSIS — R278 Other lack of coordination: Secondary | ICD-10-CM | POA: Diagnosis not present

## 2022-06-01 ENCOUNTER — Encounter: Payer: Self-pay | Admitting: Adult Health

## 2022-06-01 ENCOUNTER — Non-Acute Institutional Stay (SKILLED_NURSING_FACILITY): Payer: Medicare Other | Admitting: Adult Health

## 2022-06-01 DIAGNOSIS — I509 Heart failure, unspecified: Secondary | ICD-10-CM | POA: Diagnosis not present

## 2022-06-01 DIAGNOSIS — M62562 Muscle wasting and atrophy, not elsewhere classified, left lower leg: Secondary | ICD-10-CM | POA: Diagnosis not present

## 2022-06-01 DIAGNOSIS — Z950 Presence of cardiac pacemaker: Secondary | ICD-10-CM | POA: Diagnosis not present

## 2022-06-01 DIAGNOSIS — J9601 Acute respiratory failure with hypoxia: Secondary | ICD-10-CM | POA: Diagnosis not present

## 2022-06-01 DIAGNOSIS — I10 Essential (primary) hypertension: Secondary | ICD-10-CM | POA: Diagnosis not present

## 2022-06-01 DIAGNOSIS — R4189 Other symptoms and signs involving cognitive functions and awareness: Secondary | ICD-10-CM | POA: Diagnosis not present

## 2022-06-01 DIAGNOSIS — I480 Paroxysmal atrial fibrillation: Secondary | ICD-10-CM | POA: Diagnosis not present

## 2022-06-01 DIAGNOSIS — R0902 Hypoxemia: Secondary | ICD-10-CM | POA: Diagnosis not present

## 2022-06-01 DIAGNOSIS — I3139 Other pericardial effusion (noninflammatory): Secondary | ICD-10-CM | POA: Diagnosis not present

## 2022-06-01 DIAGNOSIS — M6389 Disorders of muscle in diseases classified elsewhere, multiple sites: Secondary | ICD-10-CM | POA: Diagnosis not present

## 2022-06-01 DIAGNOSIS — K810 Acute cholecystitis: Secondary | ICD-10-CM | POA: Diagnosis not present

## 2022-06-01 DIAGNOSIS — R278 Other lack of coordination: Secondary | ICD-10-CM | POA: Diagnosis not present

## 2022-06-01 DIAGNOSIS — G9341 Metabolic encephalopathy: Secondary | ICD-10-CM | POA: Diagnosis not present

## 2022-06-01 DIAGNOSIS — R2689 Other abnormalities of gait and mobility: Secondary | ICD-10-CM | POA: Diagnosis not present

## 2022-06-01 DIAGNOSIS — M62561 Muscle wasting and atrophy, not elsewhere classified, right lower leg: Secondary | ICD-10-CM | POA: Diagnosis not present

## 2022-06-01 LAB — CBC: RBC: 2.98 — AB (ref 3.87–5.11)

## 2022-06-01 LAB — BASIC METABOLIC PANEL
BUN: 32 — AB (ref 4–21)
CO2: 28 — AB (ref 13–22)
Chloride: 99 (ref 99–108)
Creatinine: 1.2 — AB (ref 0.5–1.1)
Glucose: 95
Potassium: 4.2 mEq/L (ref 3.5–5.1)
Sodium: 136 — AB (ref 137–147)

## 2022-06-01 LAB — CBC AND DIFFERENTIAL
HCT: 31 — AB (ref 36–46)
Hemoglobin: 10.2 — AB (ref 12.0–16.0)
Neutrophils Absolute: 5.2
Platelets: 254 10*3/uL (ref 150–400)
WBC: 6.3

## 2022-06-01 LAB — COMPREHENSIVE METABOLIC PANEL
Albumin: 3.5 (ref 3.5–5.0)
Calcium: 9.1 (ref 8.7–10.7)
eGFR: 47

## 2022-06-01 LAB — HEPATIC FUNCTION PANEL
ALT: 25 U/L (ref 7–35)
AST: 30 (ref 13–35)
Alkaline Phosphatase: 77 (ref 25–125)
Bilirubin, Total: 0.4

## 2022-06-01 NOTE — Progress Notes (Unsigned)
Location:  [facility] Room Number: 154A Place of Service:  SNF [phone]) Provider:  Sunday Shams, MD  Patient Care Team: Virgie Dad, MD as PCP - General (Internal Medicine) [place], [name], MD as PCP - Cardiology (Cardiology)  Extended Emergency Contact Information Primary Emergency Contact: [place] Mobile Phone: [phone] Relation: Daughter Secondary Emergency Contact: MICHALE, WEIKEL Mobile Phone: [phone] Relation: Son  Code Status:  DNR Goals of care: Advanced Directive information    06/01/2022   11:19 AM  Advanced Directives  Does Patient Have a Medical Advance Directive? Yes  Type of Paramedic of [place];Living will;Out of facility DNR (pink MOST or yellow form)  Does patient want to make changes to medical advance directive? No - Patient declined  Copy of [place] in Chart? Yes - validated most recent copy scanned in chart (See row information)  Pre-existing out of facility DNR order (yellow form or pink MOST form) Yellow form placed in chart (order not valid for inpatient use)     Chief Complaint  Patient presents with   Acute Visit    Acute Visit    HPI:  Pt is a 86 y.o. female seen today for an acute visit for f/u CXR and hypoxia.   She currently resides on the rehabilitation unit at [facility]. PMH: aortic atherosclerosis, cerebral AV malformation, HTN, HLD, Heart block AV-s/p PPM 05/30, HTN, MVP, afib, epilepsy, encephalopathy, neuropathy, anemia, and unstable gait.     She was seen on 12/18 due to cough and oxygen requirements.  She is s/p lap chole 04/23/22 and had and post op seizure.   CXR 05/29/22 moderate bilateral patchy densities L>R.  Bilateral small effusions.  Started on doxycycline on 12/20 for 7 days.  Covid and flu swab neg Received two doses of lasix this week due to weight gain and edema on CXR.  Resident remains on 2 liters  of oxygen. When off sats are in the high 80s.  She has slight cough with no sputum which has improved. No congestion or sore throat. Weight is down by 5 lbs. Has chronic edema in her legs and a fluid pocket at her left flank that has improved slightly. Has some DOE but comfortable at rest. No chest pain Wt Readings from Last 3 Encounters:  06/01/22 140 lb 6.4 oz (63.7 kg)  05/28/22 145 lb (65.8 kg)  05/09/22 144 lb 3.2 oz (65.4 kg)    SBP 150-170s  Reviewed echo 02/13/22 which showed unchanged moderate pericardial effusion EF 50-55% Has a pacemaker hx of afib not on DOAC  Past Medical History:  Diagnosis Date   Abnormal cardiovascular stress test    Carotid arterial disease (HCC)    MODERATE RIGHT   Cerebral AV malformation    DDD (degenerative disc disease)    SEVERE WITH SPINAL STENOSIS   Hypercholesterolemia    Hypertension    MVP (mitral valve prolapse)    MILD   Numbness    LEFT FOOT AND ANKLE   Osteopenia    Presence of permanent cardiac pacemaker    Pseudo-gout    Past Surgical History:  Procedure Laterality Date   APPENDECTOMY     BREAST MASS EXCISION     CARPAL TUNNEL RELEASE     CHOLECYSTECTOMY N/A 04/23/2022   Procedure: LAPAROSCOPIC CHOLECYSTECTOMY;  Surgeon: Coralie Keens, MD;  Location: [facility];  Service: General;  Laterality: N/A;   PACEMAKER IMPLANT N/A 11/07/2021   Procedure: PACEMAKER  IMPLANT;  Surgeon: Evans Lance, MD;  Location: [facility] CV LAB;  Service: Cardiovascular;  Laterality: N/A;   REMOVAL OF PARATHYROID GLAND     STRESS NUCLEAR STUDY     ABNORMAL   TONSILLECTOMY      Allergies  Allergen Reactions   Aleve [Naproxen] Other (See Comments)   Azithromycin Other (See Comments)    Reaction not recalled   Penicillins Hives and Other (See Comments)    Bad reaction as a child after a shot of Penicillin   Pravastatin Other (See Comments)    Reaction not recalled   Sulfa Antibiotics Other (See Comments)   Sulfa Drugs Cross Reactors Other  (See Comments)    Reaction not recalled- stated she can take it "in small doses"   Tape Itching and Other (See Comments)    Cannot wear for an extended period of time   Naproxen Sodium Rash    Outpatient Encounter Medications as of 06/01/2022  Medication Sig   acetaminophen (TYLENOL) 500 MG tablet Take 500 mg by mouth every 6 (six) hours as needed for mild pain or headache.   B Complex Vitamins (VITAMIN-B COMPLEX) TABS Take 1 tablet by mouth daily.   Dextromethorphan-guaiFENesin (ROBAFEN DM) 10-100 MG/5ML liquid Take 10 mLs by mouth every 6 (six) hours as needed (cough/congestion).   fluticasone (FLONASE) 50 MCG/ACT nasal spray Place 1-2 sprays into both nostrils daily.   hydrALAZINE (APRESOLINE) 50 MG tablet Take 1 tablet (50 mg total) by mouth 3 (three) times daily.   hydrocortisone cream 1 % Apply 1 Application topically 3 (three) times daily as needed for itching.   ipratropium-albuterol (DUONEB) 0.5-2.5 (3) MG/3ML SOLN Take 3 mLs by nebulization every 6 (six) hours as needed.   loperamide (IMODIUM A-D) 2 MG tablet Take 2 mg by mouth every 4 (four) hours as needed for diarrhea or loose stools.   magnesium oxide (MAG-OX) 400 (240 Mg) MG tablet Take 400 mg by mouth daily.   Multiple Vitamins-Minerals (MULTI COMPLETE/IRON) TABS Take 1 tablet by mouth daily with breakfast. Folic acid included   Nutritional Supplements (NUTRITIONAL DRINK PO) Take 1 Bottle by mouth daily. Chocolate Boost or Ensure with lunch   ondansetron (ZOFRAN-ODT) 4 MG disintegrating tablet Take 4 mg by mouth every 4 (four) hours as needed for nausea or vomiting.   PHENobarbital (LUMINAL) 64.8 MG tablet Take 1 tablet (64.8 mg total) by mouth in the morning.   phenytoin (DILANTIN) 100 MG ER capsule Take 200 mg by mouth daily with breakfast.   Polyethyl Glycol-Propyl Glycol (SYSTANE ULTRA) 0.4-0.3 % SOLN Place 1 drop into both eyes in the morning and at bedtime.   Probiotic Product (ALIGN) 4 MG CAPS Take 4 mg by mouth daily.    valsartan (DIOVAN) 160 MG tablet Take 1 tablet (160 mg total) by mouth daily.   No facility-administered encounter medications on file as of 06/01/2022.    Review of Systems  Constitutional:  Positive for activity change (recovering from lap chole). Negative for appetite change, chills, diaphoresis, fatigue, fever and unexpected weight change.  HENT:  Negative for congestion.   Respiratory:  Positive for cough and shortness of breath (on exertion). Negative for wheezing.   Cardiovascular:  Negative for chest pain, palpitations and leg swelling.  Gastrointestinal:  Positive for diarrhea (sometimes loose stools not frequent). Negative for abdominal distention, abdominal pain and constipation.  Genitourinary:  Negative for difficulty urinating and dysuria.  Musculoskeletal:  Positive for gait problem. Negative for arthralgias, back pain, joint swelling and  myalgias.  Neurological:  Negative for dizziness, tremors, seizures, syncope, facial asymmetry, speech difficulty, weakness, light-headedness, numbness and headaches.  Psychiatric/Behavioral:  Negative for agitation, behavioral problems and confusion.        Memory loss    Immunization History  Administered Date(s) Administered   Influenza Split 03/28/2009, 03/17/2010, 03/26/2011, 04/01/2012, 03/05/2013   Influenza, Quadrivalent, Recombinant, Inj, Pf 03/08/2018, 02/13/2019, 02/24/2020   Influenza,inj,Quad PF,6+ Mos 02/25/2014, 03/05/2015   Influenza-Unspecified 02/09/2014, 02/25/2016, 04/06/2017, 04/25/2021   Moderna Sars-Covid-2 Vaccination 07/17/2019, 08/14/2019, 02/24/2020, 04/26/2020   Pneumococcal Conjugate-13 11/26/2014   Pneumococcal-Unspecified 06/23/2003, 11/10/2012   Td (Adult), 2 Lf Tetanus Toxid, Preservative Free 06/11/2000, 11/06/2011   Unspecified SARS-COV-2 Vaccination 04/28/2021   Zoster Recombinat (Shingrix) 01/09/2019, 05/08/2019   Zoster, Live 01/15/2006, 01/15/2019   Pertinent  Health Maintenance Due  Topic  Date Due   DEXA SCAN  Never done   INFLUENZA VACCINE  01/09/2022      04/28/2022    8:00 AM 04/28/2022   10:00 PM 04/29/2022    8:00 AM 04/29/2022    8:10 PM 05/01/2022    9:41 AM  Fall Risk  Falls in the past year?     0  Was there an injury with Fall?     0  Fall Risk Category Calculator     0  Fall Risk Category     Low  Patient Fall Risk Level High fall risk High fall risk High fall risk High fall risk High fall risk  Patient at Risk for Falls Due to     Impaired balance/gait;Impaired mobility  Fall risk Follow up     Falls evaluation completed   Functional Status Survey:    Vitals:   06/01/22 1108  BP: (!) 158/84  Pulse: 61  Resp: 16  Temp: (!) 97 F (36.1 C)  TempSrc: Temporal  SpO2: 94%  Weight: 136 lb 9.6 oz (62 kg)  Height: '5\' 5"'$  (1.651 m)   Body mass index is 22.73 kg/m. Physical Exam  Labs reviewed: Recent Labs    04/22/22 1826 04/23/22 0811 04/24/22 0504 04/25/22 0735 04/26/22 0901 04/29/22 0436  NA  --    < > 136 135 136  --   K  --    < > 3.7 3.7 3.8  --   CL  --    < > 103 103 105  --   CO2  --    < > 22 19* 21*  --   GLUCOSE  --    < > 91 89 98  --   BUN  --    < > 29* 29* 27*  --   CREATININE  --    < > 1.14* 1.32* 1.24* 1.07*  CALCIUM 8.1*   < > 8.1* 8.4* 8.6*  --   MG 1.1*  --  1.8 1.9  --   --   PHOS  --   --  4.4 3.5  --   --    < > = values in this interval not displayed.   Recent Labs    04/24/22 0504 04/25/22 0735 04/26/22 0901  AST '24 21 18  '$ ALT '16 15 12  '$ ALKPHOS 52 53 54  BILITOT 0.7 1.1 0.9  PROT 5.8* 5.9* 5.9*  ALBUMIN 2.8* 2.8* 2.6*   Recent Labs    04/22/22 0434 04/23/22 0811 04/24/22 0504 04/25/22 0735 04/26/22 0901  WBC 13.3*   < > 11.3* 8.9 5.7  NEUTROABS 11.9*  --  10.0* 7.6  --  HGB 10.2*   < > 9.1* 9.5* 10.2*  HCT 31.7*   < > 28.1* 29.3* 32.4*  MCV 104.3*   < > 104.5* 103.5* 106.2*  PLT 288   < > 237 274 283   < > = values in this interval not displayed.   Lab Results  Component Value Date    TSH 4.325 04/25/2022   Lab Results  Component Value Date   HGBA1C 4.9 04/22/2022   Lab Results  Component Value Date   CHOL 208 (H) 01/28/2022   HDL 80 01/28/2022   LDLCALC 116 (H) 01/28/2022   TRIG 59 01/28/2022   CHOLHDL 2.6 01/28/2022    Significant Diagnostic Results in last 30 days:  CUP PACEART REMOTE DEVICE CHECK  Result Date: 05/10/2022 Scheduled alert remote reviewed. Normal device function.  Alert: low atrial lead impedance unipolar, the patient is programmed bipolar and the lead impedance is in normal range.  Ongoing AF, OAC contraindicated according to previous reports, AF burden is 96.5% of the time, sent to triage for significant increase in burden Next remote 91 days and in-clinic 06/29/2022 Kathy Breach, RN, CCDS, [employer]   Assessment/Plan  1. Hypoxia Mild on 2 liters ?Pna vs CHF, lean more towards CHF Currently on day 2/7 of doxycycline Received two doses of lasix with some weight loss Will give two more doses with kdur Weights M W F F/u with cardiology if not improving.  Ordered BNP follow up labs and f/u CXR  2. Paroxysmal A-fib ([place]) Did not tolerate amiodarone due to GI s/e Rate is controlled Not on DOAC due to contraindication AV malformation  3. S/P placement of cardiac pacemaker noted  4. Essential hypertension Above goal, see #1  5. Pericardial effusion without cardiac tamponade Monitored by cardiology with echo cardiogram No chest pain     Labs/tests ordered:  F/U CXR ordered. CBC BMP BNP

## 2022-06-05 ENCOUNTER — Encounter: Payer: Self-pay | Admitting: Internal Medicine

## 2022-06-05 ENCOUNTER — Encounter (SKILLED_NURSING_FACILITY): Payer: Medicare Other | Admitting: Internal Medicine

## 2022-06-05 DIAGNOSIS — R0902 Hypoxemia: Secondary | ICD-10-CM | POA: Diagnosis not present

## 2022-06-05 DIAGNOSIS — I10 Essential (primary) hypertension: Secondary | ICD-10-CM | POA: Diagnosis not present

## 2022-06-05 DIAGNOSIS — R2689 Other abnormalities of gait and mobility: Secondary | ICD-10-CM | POA: Diagnosis not present

## 2022-06-05 DIAGNOSIS — R278 Other lack of coordination: Secondary | ICD-10-CM | POA: Diagnosis not present

## 2022-06-05 DIAGNOSIS — I480 Paroxysmal atrial fibrillation: Secondary | ICD-10-CM | POA: Diagnosis not present

## 2022-06-05 DIAGNOSIS — R195 Other fecal abnormalities: Secondary | ICD-10-CM

## 2022-06-05 DIAGNOSIS — K810 Acute cholecystitis: Secondary | ICD-10-CM | POA: Diagnosis not present

## 2022-06-05 DIAGNOSIS — M62561 Muscle wasting and atrophy, not elsewhere classified, right lower leg: Secondary | ICD-10-CM | POA: Diagnosis not present

## 2022-06-05 DIAGNOSIS — G9341 Metabolic encephalopathy: Secondary | ICD-10-CM | POA: Diagnosis not present

## 2022-06-05 DIAGNOSIS — M62562 Muscle wasting and atrophy, not elsewhere classified, left lower leg: Secondary | ICD-10-CM | POA: Diagnosis not present

## 2022-06-05 NOTE — Progress Notes (Addendum)
Location:  [facility] Room Number: 154A Place of Service:  SNF [phone]) Provider:  Virgie Dad, MD   Virgie Dad, MD  Patient Care Team: Virgie Dad, MD as PCP - General (Internal Medicine) Martinique, [name], MD as PCP - Cardiology (Cardiology)  Extended Emergency Contact Information Primary Emergency Contact: [place] Mobile Phone: [phone] Relation: Daughter Secondary Emergency Contact: KEITH, CANCIO Mobile Phone: [phone] Relation: Son  Code Status:  DNR Goals of care: Advanced Directive information    06/05/2022    1:04 PM  Advanced Directives  Does Patient Have a Medical Advance Directive? Yes  Type of Paramedic of [place];Living will;Out of facility DNR (pink MOST or yellow form)  Does patient want to make changes to medical advance directive? No - Patient declined  Copy of [place] in Chart? Yes - validated most recent copy scanned in chart (See row information)  Pre-existing out of facility DNR order (yellow form or pink MOST form) Yellow form placed in chart (order not valid for inpatient use)     Chief Complaint  Patient presents with   Acute Visit    HPI:  Pt is a 86 y.o. female seen today for an acute visit for SOB and Diarrhea  In SNF in [place]  Admitted in the hospital from 11/12 to 11/24 acute cholecystitis, laparoscopic cholecystectomy and possibly postprocedure seizure   Has h/o Chronic Diarrhea  diagnosis of C. difficile treated with oral Vanco, in  04/05/2022  C/o Some worsening of  diarrhea again. No Nocturnal Symptoms With No abdominal pain or Fever  Appetite is poor  SOB Chest Xray Bilateral Infiltrate Treated with Doxycyline and also with Lasix Feels Better Wt Readings from Last 3 Encounters:  06/05/22 138 lb 3.2 oz (62.7 kg)  06/01/22 140 lb 6.4 oz (63.7 kg)  05/28/22 145 lb (65.8 kg)   Off Oxygen now Denies any cough  Does have Swelling  in her Legs BNP t12/22/23  was 226  Otherwise she is doing well Walking with her walker and doing her ADLS    Past Medical History:  Diagnosis Date   Abnormal cardiovascular stress test    Carotid arterial disease ([place])    MODERATE RIGHT   Cerebral AV malformation    DDD (degenerative disc disease)    SEVERE WITH SPINAL STENOSIS   Hypercholesterolemia    Hypertension    MVP (mitral valve prolapse)    MILD   Numbness    LEFT FOOT AND ANKLE   Osteopenia    Presence of permanent cardiac pacemaker    Pseudo-gout    Past Surgical History:  Procedure Laterality Date   APPENDECTOMY     BREAST MASS EXCISION     CARPAL TUNNEL RELEASE     CHOLECYSTECTOMY N/A 04/23/2022   Procedure: LAPAROSCOPIC CHOLECYSTECTOMY;  Surgeon: Coralie Keens, MD;  Location: [facility];  Service: General;  Laterality: N/A;   PACEMAKER IMPLANT N/A 11/07/2021   Procedure: PACEMAKER IMPLANT;  Surgeon: Evans Lance, MD;  Location: [facility];  Service: Cardiovascular;  Laterality: N/A;   REMOVAL OF PARATHYROID GLAND     STRESS NUCLEAR STUDY     ABNORMAL   TONSILLECTOMY      Allergies  Allergen Reactions   Aleve [Naproxen] Other (See Comments)   Azithromycin Other (See Comments)    Reaction not recalled   Penicillins Hives and Other (See Comments)    Bad reaction as a child after a shot  of Penicillin   Pravastatin Other (See Comments)    Reaction not recalled   Sulfa Antibiotics Other (See Comments)   Sulfa Drugs Cross Reactors Other (See Comments)    Reaction not recalled- stated she can take it "in small doses"   Tape Itching and Other (See Comments)    Cannot wear for an extended period of time   Naproxen Sodium Rash    Outpatient Encounter Medications as of 06/05/2022  Medication Sig   acetaminophen (TYLENOL) 500 MG tablet Take 500 mg by mouth every 6 (six) hours as needed for mild pain or headache.   B Complex Vitamins (VITAMIN-B COMPLEX) TABS Take 1 tablet by mouth daily.    Dextromethorphan-guaiFENesin (ROBAFEN DM) 10-100 MG/5ML liquid Take 10 mLs by mouth every 6 (six) hours as needed (cough/congestion).   fluticasone (FLONASE) 50 MCG/ACT nasal spray Place 1-2 sprays into both nostrils daily.   hydrALAZINE (APRESOLINE) 50 MG tablet Take 1 tablet (50 mg total) by mouth 3 (three) times daily.   hydrocortisone cream 1 % Apply 1 Application topically 3 (three) times daily as needed for itching.   ipratropium-albuterol (DUONEB) 0.5-2.5 (3) MG/3ML SOLN Take 3 mLs by nebulization every 6 (six) hours as needed.   loperamide (IMODIUM A-D) 2 MG tablet Take 2 mg by mouth every 4 (four) hours as needed for diarrhea or loose stools.   magnesium oxide (MAG-OX) 400 (240 Mg) MG tablet Take 400 mg by mouth daily.   Multiple Vitamins-Minerals (MULTI COMPLETE/IRON) TABS Take 1 tablet by mouth daily with breakfast. Folic acid included   ondansetron (ZOFRAN-ODT) 4 MG disintegrating tablet Take 4 mg by mouth every 4 (four) hours as needed for nausea or vomiting.   PHENobarbital (LUMINAL) 64.8 MG tablet Take 1 tablet (64.8 mg total) by mouth in the morning.   phenytoin (DILANTIN) 100 MG ER capsule Take 200 mg by mouth daily with breakfast.   Polyethyl Glycol-Propyl Glycol (SYSTANE ULTRA) 0.4-0.3 % SOLN Place 1 drop into both eyes in the morning and at bedtime.   Probiotic Product (ALIGN) 4 MG CAPS Take 4 mg by mouth daily.   valsartan (DIOVAN) 160 MG tablet Take 1 tablet (160 mg total) by mouth daily.   Nutritional Supplements (NUTRITIONAL DRINK PO) Take 1 Bottle by mouth daily. Chocolate Boost or Ensure with lunch   No facility-administered encounter medications on file as of 06/05/2022.    Review of Systems  Constitutional:  Positive for appetite change. Negative for activity change.  HENT: Negative.    Respiratory:  Negative for cough and shortness of breath.   Cardiovascular:  Positive for leg swelling.  Gastrointestinal:  Positive for diarrhea. Negative for constipation.   Genitourinary: Negative.   Musculoskeletal:  Negative for arthralgias, gait problem and myalgias.  Skin: Negative.   Neurological:  Positive for weakness. Negative for dizziness.  Psychiatric/Behavioral:  Negative for confusion, dysphoric mood and sleep disturbance.     Immunization History  Administered Date(s) Administered   Fluad Quad(high Dose 65+) 04/16/2022   Influenza Split 03/28/2009, 03/17/2010, 03/26/2011, 04/01/2012, 03/05/2013   Influenza, Quadrivalent, Recombinant, Inj, Pf 03/08/2018, 02/13/2019, 02/24/2020   Influenza,inj,Quad PF,6+ Mos 02/25/2014, 03/05/2015   Influenza-Unspecified 02/09/2014, 02/25/2016, 04/06/2017, 04/25/2021   Moderna Sars-Covid-2 Vaccination 07/17/2019, 08/14/2019, 02/24/2020, 04/26/2020   Pneumococcal Conjugate-13 11/26/2014   Pneumococcal-Unspecified 06/23/2003, 11/10/2012   Td (Adult), 2 Lf Tetanus Toxid, Preservative Free 06/11/2000, 11/06/2011   Unspecified SARS-COV-2 Vaccination 04/28/2021   Zoster Recombinat (Shingrix) 01/09/2019, 05/08/2019   Zoster, Live 01/15/2006, 01/15/2019   Pertinent  Health Maintenance  Due  Topic Date Due   DEXA SCAN  Never done   INFLUENZA VACCINE  Completed      04/28/2022    8:00 AM 04/28/2022   10:00 PM 04/29/2022    8:00 AM 04/29/2022    8:10 PM 05/01/2022    9:41 AM  Fall Risk  Falls in the past year?     0  Was there an injury with Fall?     0  Fall Risk Category Calculator     0  Fall Risk Category     Low  Patient Fall Risk Level High fall risk High fall risk High fall risk High fall risk High fall risk  Patient at Risk for Falls Due to     Impaired balance/gait;Impaired mobility  Fall risk Follow up     Falls evaluation completed   Functional Status Survey:    Vitals:   06/05/22 1254  BP: (!) 157/66  Pulse: 60  Resp: 18  Temp: 98.2 F (36.8 C)  TempSrc: Temporal  SpO2: 98%  Weight: 138 lb 3.2 oz (62.7 kg)  Height: '5\' 5"'$  (1.651 m)   Body mass index is 23 kg/m. Physical  Exam Vitals reviewed.  Constitutional:      Appearance: Normal appearance.  HENT:     Head: Normocephalic.     Nose: Nose normal.     Mouth/Throat:     Mouth: Mucous membranes are moist.     Pharynx: Oropharynx is clear.  Eyes:     Pupils: Pupils are equal, round, and reactive to light.  Cardiovascular:     Rate and Rhythm: Normal rate and regular rhythm.     Pulses: Normal pulses.     Heart sounds: Normal heart sounds. No murmur heard. Pulmonary:     Effort: Pulmonary effort is normal.     Breath sounds: Normal breath sounds. No wheezing or rales.  Abdominal:     General: Abdomen is flat. Bowel sounds are normal. There is no distension.     Palpations: Abdomen is soft.     Tenderness: There is no abdominal tenderness.  Musculoskeletal:        General: Swelling present.     Cervical back: Neck supple.  Skin:    General: Skin is warm.  Neurological:     General: No focal deficit present.     Mental Status: She is alert and oriented to person, place, and time.  Psychiatric:        Mood and Affect: Mood normal.        Thought Content: Thought content normal.     Labs reviewed: Recent Labs    04/22/22 1826 04/23/22 0811 04/24/22 0504 04/25/22 0735 04/26/22 0901 04/29/22 0436 05/07/22 0000  NA  --    < > 136 135 136  --  141  K  --    < > 3.7 3.7 3.8  --  4.6  CL  --    < > 103 103 105  --  103  CO2  --    < > 22 19* 21*  --  25*  GLUCOSE  --    < > 91 89 98  --   --   BUN  --    < > 29* 29* 27*  --  30*  CREATININE  --    < > 1.14* 1.32* 1.24* 1.07* 1.1  CALCIUM 8.1*   < > 8.1* 8.4* 8.6*  --  8.6*  MG 1.1*  --  1.8  1.9  --   --   --   PHOS  --   --  4.4 3.5  --   --   --    < > = values in this interval not displayed.   Recent Labs    04/24/22 0504 04/25/22 0735 04/26/22 0901 05/07/22 0000  AST '24 21 18 26  '$ ALT '16 15 12 16  '$ ALKPHOS 52 53 54 59  BILITOT 0.7 1.1 0.9  --   PROT 5.8* 5.9* 5.9*  --   ALBUMIN 2.8* 2.8* 2.6* 3.0*   Recent Labs     04/22/22 0434 04/23/22 0811 04/24/22 0504 04/25/22 0735 04/26/22 0901 05/07/22 0000  WBC 13.3*   < > 11.3* 8.9 5.7 6.7  NEUTROABS 11.9*  --  10.0* 7.6  --   --   HGB 10.2*   < > 9.1* 9.5* 10.2* 10.0*  HCT 31.7*   < > 28.1* 29.3* 32.4* 29*  MCV 104.3*   < > 104.5* 103.5* 106.2*  --   PLT 288   < > 237 274 283 411*   < > = values in this interval not displayed.   Lab Results  Component Value Date   TSH 4.325 04/25/2022   Lab Results  Component Value Date   HGBA1C 4.9 04/22/2022   Lab Results  Component Value Date   CHOL 208 (H) 01/28/2022   HDL 80 01/28/2022   LDLCALC 116 (H) 01/28/2022   TRIG 59 01/28/2022   CHOLHDL 2.6 01/28/2022    Significant Diagnostic Results in last 30 days:  CUP PACEART REMOTE DEVICE CHECK  Result Date: 05/10/2022 Scheduled alert remote reviewed. Normal device function.  Alert: low atrial lead impedance unipolar, the patient is programmed bipolar and the lead impedance is in normal range.  Ongoing AF, OAC contraindicated according to previous reports, AF burden is 96.5% of the time, sent to triage for significant increase in burden Next remote 91 days and in-clinic 06/29/2022 Kathy Breach, RN, CCDS, [facility] [employer]   Assessment/Plan 1. Loose stools Check Stool for C Diff   2. Hypoxia Most Likely due to CHF Lost weight on Lasix and Feels better Now off Oxygen Will start her on Lasix 20 mg 3/week with Potassium Daily weights  3. Paroxysmal A-fib (HCC) Off amiodarone due to side effects No Anticoagulation due to AVMS    4. Essential hypertension Hydralazine QID    Family/ staff Communication:   Labs/tests ordered:

## 2022-06-05 NOTE — Progress Notes (Signed)
06/01/2022 LABS ABSTRACTED AND REVIEWED BY wert,np

## 2022-06-05 NOTE — Progress Notes (Signed)
Remote pacemaker transmission.   

## 2022-06-06 DIAGNOSIS — M6389 Disorders of muscle in diseases classified elsewhere, multiple sites: Secondary | ICD-10-CM | POA: Diagnosis not present

## 2022-06-06 DIAGNOSIS — M62562 Muscle wasting and atrophy, not elsewhere classified, left lower leg: Secondary | ICD-10-CM | POA: Diagnosis not present

## 2022-06-06 DIAGNOSIS — G9341 Metabolic encephalopathy: Secondary | ICD-10-CM | POA: Diagnosis not present

## 2022-06-06 DIAGNOSIS — M62561 Muscle wasting and atrophy, not elsewhere classified, right lower leg: Secondary | ICD-10-CM | POA: Diagnosis not present

## 2022-06-06 DIAGNOSIS — K810 Acute cholecystitis: Secondary | ICD-10-CM | POA: Diagnosis not present

## 2022-06-06 DIAGNOSIS — R2689 Other abnormalities of gait and mobility: Secondary | ICD-10-CM | POA: Diagnosis not present

## 2022-06-06 DIAGNOSIS — R4189 Other symptoms and signs involving cognitive functions and awareness: Secondary | ICD-10-CM | POA: Diagnosis not present

## 2022-06-06 DIAGNOSIS — R278 Other lack of coordination: Secondary | ICD-10-CM | POA: Diagnosis not present

## 2022-06-06 DIAGNOSIS — R197 Diarrhea, unspecified: Secondary | ICD-10-CM | POA: Diagnosis not present

## 2022-06-07 ENCOUNTER — Non-Acute Institutional Stay (SKILLED_NURSING_FACILITY): Payer: Medicare Other | Admitting: Adult Health

## 2022-06-07 ENCOUNTER — Encounter: Payer: Self-pay | Admitting: Adult Health

## 2022-06-07 DIAGNOSIS — Z9049 Acquired absence of other specified parts of digestive tract: Secondary | ICD-10-CM

## 2022-06-07 DIAGNOSIS — E782 Mixed hyperlipidemia: Secondary | ICD-10-CM | POA: Diagnosis not present

## 2022-06-07 DIAGNOSIS — I480 Paroxysmal atrial fibrillation: Secondary | ICD-10-CM

## 2022-06-07 DIAGNOSIS — I442 Atrioventricular block, complete: Secondary | ICD-10-CM | POA: Diagnosis not present

## 2022-06-07 DIAGNOSIS — M62562 Muscle wasting and atrophy, not elsewhere classified, left lower leg: Secondary | ICD-10-CM | POA: Diagnosis not present

## 2022-06-07 DIAGNOSIS — R4189 Other symptoms and signs involving cognitive functions and awareness: Secondary | ICD-10-CM | POA: Diagnosis not present

## 2022-06-07 DIAGNOSIS — R2689 Other abnormalities of gait and mobility: Secondary | ICD-10-CM | POA: Diagnosis not present

## 2022-06-07 DIAGNOSIS — I3139 Other pericardial effusion (noninflammatory): Secondary | ICD-10-CM | POA: Diagnosis not present

## 2022-06-07 DIAGNOSIS — Q282 Arteriovenous malformation of cerebral vessels: Secondary | ICD-10-CM | POA: Diagnosis not present

## 2022-06-07 DIAGNOSIS — G40209 Localization-related (focal) (partial) symptomatic epilepsy and epileptic syndromes with complex partial seizures, not intractable, without status epilepticus: Secondary | ICD-10-CM

## 2022-06-07 DIAGNOSIS — I10 Essential (primary) hypertension: Secondary | ICD-10-CM | POA: Diagnosis not present

## 2022-06-07 DIAGNOSIS — D539 Nutritional anemia, unspecified: Secondary | ICD-10-CM

## 2022-06-07 DIAGNOSIS — R0902 Hypoxemia: Secondary | ICD-10-CM

## 2022-06-07 DIAGNOSIS — R278 Other lack of coordination: Secondary | ICD-10-CM | POA: Diagnosis not present

## 2022-06-07 DIAGNOSIS — K810 Acute cholecystitis: Secondary | ICD-10-CM | POA: Diagnosis not present

## 2022-06-07 DIAGNOSIS — M62561 Muscle wasting and atrophy, not elsewhere classified, right lower leg: Secondary | ICD-10-CM | POA: Diagnosis not present

## 2022-06-07 DIAGNOSIS — M6389 Disorders of muscle in diseases classified elsewhere, multiple sites: Secondary | ICD-10-CM | POA: Diagnosis not present

## 2022-06-07 DIAGNOSIS — G9341 Metabolic encephalopathy: Secondary | ICD-10-CM | POA: Diagnosis not present

## 2022-06-07 NOTE — Progress Notes (Addendum)
Location:  [facility] Room Number: 154A Place of Service:  SNF [phone]) Provider:  Earney Hamburg, MD  Patient Care Team: Virgie Dad, MD as PCP - General (Internal Medicine) [place], [name], MD as PCP - Cardiology (Cardiology)  Extended Emergency Contact Information Primary Emergency Contact: [place] Mobile Phone: [phone] Relation: Daughter Secondary Emergency Contact: SHAHAD, MAZUREK Mobile Phone: [phone] Relation: Son  Code Status:  DNR Goals of care: Advanced Directive information    06/07/2022   11:39 AM  Advanced Directives  Does Patient Have a Medical Advance Directive? Yes  Type of Paramedic of [place];Living will;Out of facility DNR (pink MOST or yellow form)  Does patient want to make changes to medical advance directive? No - Patient declined  Copy of [place] in Chart? Yes - validated most recent copy scanned in chart (See row information)  Pre-existing out of facility DNR order (yellow form or pink MOST form) Yellow form placed in chart (order not valid for inpatient use)     Chief Complaint  Patient presents with   Acute Visit    Patient is experiencing Loose stools   Immunizations    Discussed the need for tetanus , pneumonia and covid vaccine     HPI:  Pt is a 86 y.o. female seen today for an acute visit for   She currently resides on the rehabilitation unit at [facility]. PMH: aortic atherosclerosis, cerebral AV malformation, HTN, HLD, Heart block AV-s/p PPM 05/30, HTN, MVP, afib, epilepsy, encephalopathy, neuropathy, anemia, and unstable gait.     She is s/p lap chole 04/23/22 and had and post op seizure.   She was seen on 12/18 due to cough and oxygen requirements. Xray showed fluid overload vs pna. She was given a course of doxycycline and lasix. She is now off oxygen and her cough has improved.   CXR 06/05/22 showed bilateral  infiltrate moderate left and small right effusion.  Consistent with CHF in the appropriate clinical setting.   Wt Readings from Last 3 Encounters:  06/07/22 140 lb (63.5 kg)  06/05/22 138 lb 3.2 oz (62.7 kg)  06/01/22 140 lb 6.4 oz (63.7 kg)     Intermittently continues to have loose stools. None today but two overnight. Felt the loose stools were worse on doxycycline but this has been on and off an issue. Cdiff was checked and is negative. She is eating and drinking well. No nausea.  Afib not on DOAC due to cerebral AV malformation Has pericardial effusion being monitored by cardiology  Bp still running high in 160s  Reviewed echo 02/13/22 which showed unchanged moderate pericardial effusion EF 50-55% Past Medical History:  Diagnosis Date   Abnormal cardiovascular stress test    Carotid arterial disease (HCC)    MODERATE RIGHT   Cerebral AV malformation    DDD (degenerative disc disease)    SEVERE WITH SPINAL STENOSIS   Hypercholesterolemia    Hypertension    MVP (mitral valve prolapse)    MILD   Numbness    LEFT FOOT AND ANKLE   Osteopenia    Presence of permanent cardiac pacemaker    Pseudo-gout    Past Surgical History:  Procedure Laterality Date   APPENDECTOMY     BREAST MASS EXCISION     CARPAL TUNNEL RELEASE     CHOLECYSTECTOMY N/A 04/23/2022   Procedure: LAPAROSCOPIC CHOLECYSTECTOMY;  Surgeon: Coralie Keens, MD;  Location: [facility];  Service: General;  Laterality: N/A;   PACEMAKER IMPLANT N/A 11/07/2021   Procedure: PACEMAKER IMPLANT;  Surgeon: Evans Lance, MD;  Location: [facility];  Service: Cardiovascular;  Laterality: N/A;   REMOVAL OF PARATHYROID GLAND     STRESS NUCLEAR STUDY     ABNORMAL   TONSILLECTOMY      Allergies  Allergen Reactions   Aleve [Naproxen] Other (See Comments)   Azithromycin Other (See Comments)    Reaction not recalled   Penicillins Hives and Other (See Comments)    Bad reaction as a child after a shot of Penicillin    Pravastatin Other (See Comments)    Reaction not recalled   Sulfa Antibiotics Other (See Comments)   Sulfa Drugs Cross Reactors Other (See Comments)    Reaction not recalled- stated she can take it "in small doses"   Tape Itching and Other (See Comments)    Cannot wear for an extended period of time   Naproxen Sodium Rash    Outpatient Encounter Medications as of 06/07/2022  Medication Sig   acetaminophen (TYLENOL) 500 MG tablet Take 500 mg by mouth every 6 (six) hours as needed for mild pain or headache.   B Complex Vitamins (VITAMIN-B COMPLEX) TABS Take 1 tablet by mouth daily.   Dextromethorphan-guaiFENesin (ROBAFEN DM) 10-100 MG/5ML liquid Take 10 mLs by mouth every 6 (six) hours as needed (cough/congestion).   fluticasone (FLONASE) 50 MCG/ACT nasal spray Place 1-2 sprays into both nostrils daily.   hydrALAZINE (APRESOLINE) 50 MG tablet Take 1 tablet (50 mg total) by mouth 3 (three) times daily.   hydrocortisone cream 1 % Apply 1 Application topically 3 (three) times daily as needed for itching.   ipratropium-albuterol (DUONEB) 0.5-2.5 (3) MG/3ML SOLN Take 3 mLs by nebulization every 6 (six) hours as needed.   loperamide (IMODIUM A-D) 2 MG tablet Take 2 mg by mouth every 4 (four) hours as needed for diarrhea or loose stools.   magnesium oxide (MAG-OX) 400 (240 Mg) MG tablet Take 400 mg by mouth daily.   Multiple Vitamins-Minerals (MULTI COMPLETE/IRON) TABS Take 1 tablet by mouth daily with breakfast. Folic acid included   Nutritional Supplements (NUTRITIONAL DRINK PO) Take 1 Bottle by mouth daily. Chocolate Boost or Ensure with lunch   ondansetron (ZOFRAN-ODT) 4 MG disintegrating tablet Take 4 mg by mouth every 4 (four) hours as needed for nausea or vomiting.   PHENobarbital (LUMINAL) 64.8 MG tablet Take 1 tablet (64.8 mg total) by mouth in the morning.   phenytoin (DILANTIN) 100 MG ER capsule Take 200 mg by mouth daily with breakfast.   Polyethyl Glycol-Propyl Glycol (SYSTANE ULTRA)  0.4-0.3 % SOLN Place 1 drop into both eyes in the morning and at bedtime.   Probiotic Product (ALIGN) 4 MG CAPS Take 4 mg by mouth daily.   valsartan (DIOVAN) 160 MG tablet Take 1 tablet (160 mg total) by mouth daily.   No facility-administered encounter medications on file as of 06/07/2022.    Review of Systems  Immunization History  Administered Date(s) Administered   Fluad Quad(high Dose 65+) 04/16/2022   Influenza Split 03/28/2009, 03/17/2010, 03/26/2011, 04/01/2012, 03/05/2013   Influenza, Quadrivalent, Recombinant, Inj, Pf 03/08/2018, 02/13/2019, 02/24/2020   Influenza,inj,Quad PF,6+ Mos 02/25/2014, 03/05/2015   Influenza-Unspecified 02/09/2014, 02/25/2016, 04/06/2017, 04/25/2021   Moderna Sars-Covid-2 Vaccination 07/17/2019, 08/14/2019, 02/24/2020, 04/26/2020   Pneumococcal Conjugate-13 11/26/2014   Pneumococcal-Unspecified 06/23/2003, 11/10/2012   Td (Adult), 2 Lf Tetanus Toxid, Preservative Free 06/11/2000, 11/06/2011   Unspecified SARS-COV-2 Vaccination 04/28/2021   Zoster Recombinat (  Shingrix) 01/09/2019, 05/08/2019   Zoster, Live 01/15/2006, 01/15/2019   Pertinent  Health Maintenance Due  Topic Date Due   DEXA SCAN  Never done   INFLUENZA VACCINE  Completed      04/28/2022    8:00 AM 04/28/2022   10:00 PM 04/29/2022    8:00 AM 04/29/2022    8:10 PM 05/01/2022    9:41 AM  Fall Risk  Falls in the past year?     0  Was there an injury with Fall?     0  Fall Risk Category Calculator     0  Fall Risk Category     Low  Patient Fall Risk Level High fall risk High fall risk High fall risk High fall risk High fall risk  Patient at Risk for Falls Due to     Impaired balance/gait;Impaired mobility  Fall risk Follow up     Falls evaluation completed   Functional Status Survey:    Vitals:   06/07/22 1138  BP: (!) 160/50  Pulse: 73  Resp: 16  Temp: (!) 97.5 F (36.4 C)  TempSrc: Temporal  SpO2: 95%  Weight: 140 lb (63.5 kg)  Height: '5\' 5"'$  (1.651 m)   Body  mass index is 23.3 kg/m. Physical Exam  Labs reviewed: Recent Labs    04/22/22 1826 04/23/22 0811 04/24/22 0504 04/25/22 0735 04/26/22 0901 04/29/22 0436 05/07/22 0000 05/29/22 0000 06/01/22 0000  NA  --    < > 136 135 136  --  141 138 136*  K  --    < > 3.7 3.7 3.8  --  4.6 4.0 4.2  CL  --    < > 103 103 105  --  103 101 99  CO2  --    < > 22 19* 21*  --  25* 28* 28*  GLUCOSE  --    < > 91 89 98  --   --   --   --   BUN  --    < > 29* 29* 27*  --  30* 22* 32*  CREATININE  --    < > 1.14* 1.32* 1.24*   < > 1.1 0.9 1.2*  CALCIUM 8.1*   < > 8.1* 8.4* 8.6*  --  8.6* 9.2 9.1  MG 1.1*  --  1.8 1.9  --   --   --   --   --   PHOS  --   --  4.4 3.5  --   --   --   --   --    < > = values in this interval not displayed.   Recent Labs    04/24/22 0504 04/25/22 0735 04/26/22 0901 05/07/22 0000 06/01/22 0000  AST '24 21 18 26 30  '$ ALT '16 15 12 16 25  '$ ALKPHOS 52 53 54 59 77  BILITOT 0.7 1.1 0.9  --   --   PROT 5.8* 5.9* 5.9*  --   --   ALBUMIN 2.8* 2.8* 2.6* 3.0* 3.5   Recent Labs    04/24/22 0504 04/25/22 0735 04/26/22 0901 05/07/22 0000 05/29/22 0000 06/01/22 0000  WBC 11.3* 8.9 5.7 6.7 4.8 6.3  NEUTROABS 10.0* 7.6  --   --   --  5.20  HGB 9.1* 9.5* 10.2* 10.0* 9.7* 10.2*  HCT 28.1* 29.3* 32.4* 29* 29* 31*  MCV 104.5* 103.5* 106.2*  --   --   --   PLT 237 274 283 411* 221 254  Lab Results  Component Value Date   TSH 4.325 04/25/2022   Lab Results  Component Value Date   HGBA1C 4.9 04/22/2022   Lab Results  Component Value Date   CHOL 208 (H) 01/28/2022   HDL 80 01/28/2022   LDLCALC 116 (H) 01/28/2022   TRIG 59 01/28/2022   CHOLHDL 2.6 01/28/2022    Significant Diagnostic Results in last 30 days:  CUP PACEART REMOTE DEVICE CHECK  Result Date: 05/10/2022 Scheduled alert remote reviewed. Normal device function.  Alert: low atrial lead impedance unipolar, the patient is programmed bipolar and the lead impedance is in normal range.  Ongoing AF, OAC  contraindicated according to previous reports, AF burden is 96.5% of the time, sent to triage for significant increase in burden Next remote 91 days and in-clinic 06/29/2022 Kathy Breach, RN, CCDS, [facility]   Assessment/Plan  1. Hypoxia Off oxygen Likely due to CHF BNP 226 06/01/22 Continue daily weights Lasix three times weekly F/U in clinic in 2 weeks when discharged. She is feeling like she wants to stay one more day and the consider going back to [facility] F/U with cardiology if further issues.   2. Primary hypertension Still high Increase hydralazine to 50 mg qid (was previously ordered)  3. Heart block AV complete ([place]) S/p pacemaker   4. Cerebral AV malformation hx  5. Paroxysmal atrial fibrillation (HCC) Rate is controlled Not on DOAC due to contraindication Followed by cardiology   6. Pericardial effusion without cardiac tamponade No chest pain Followed by cardiology with periodic echos  7. S/P laparoscopic cholecystectomy resolved  8. Partial symptomatic epilepsy with complex partial seizures, not intractable, without status epilepticus ([place]) No new Continue current meds.   9. Mixed hyperlipidemia Lab Results  Component Value Date   LDLCALC 116 (H) 01/28/2022     10. Macrocytic anemia Lab Results  Component Value Date   HGB 10.2 (A) 06/01/2022   Normal B12 On B vitamin   Family/ staff Communication: resident   Labs/tests ordered:  NA

## 2022-06-08 ENCOUNTER — Encounter: Payer: Self-pay | Admitting: Adult Health

## 2022-06-08 DIAGNOSIS — R4189 Other symptoms and signs involving cognitive functions and awareness: Secondary | ICD-10-CM | POA: Diagnosis not present

## 2022-06-08 DIAGNOSIS — M62562 Muscle wasting and atrophy, not elsewhere classified, left lower leg: Secondary | ICD-10-CM | POA: Diagnosis not present

## 2022-06-08 DIAGNOSIS — M62561 Muscle wasting and atrophy, not elsewhere classified, right lower leg: Secondary | ICD-10-CM | POA: Diagnosis not present

## 2022-06-08 DIAGNOSIS — R2689 Other abnormalities of gait and mobility: Secondary | ICD-10-CM | POA: Diagnosis not present

## 2022-06-08 DIAGNOSIS — G9341 Metabolic encephalopathy: Secondary | ICD-10-CM | POA: Diagnosis not present

## 2022-06-08 DIAGNOSIS — M6389 Disorders of muscle in diseases classified elsewhere, multiple sites: Secondary | ICD-10-CM | POA: Diagnosis not present

## 2022-06-08 DIAGNOSIS — R278 Other lack of coordination: Secondary | ICD-10-CM | POA: Diagnosis not present

## 2022-06-08 DIAGNOSIS — K810 Acute cholecystitis: Secondary | ICD-10-CM | POA: Diagnosis not present

## 2022-06-08 MED ORDER — HYDRALAZINE HCL 50 MG PO TABS: 50.0000 mg | ORAL_TABLET | Freq: Four times a day (QID) | ORAL | 0 refills | Status: DC

## 2022-06-08 NOTE — Addendum Note (Signed)
Addended by: Barnie Mort on: 06/08/2022 11:37 AM   Modules accepted: Orders

## 2022-06-11 DIAGNOSIS — R278 Other lack of coordination: Secondary | ICD-10-CM | POA: Diagnosis not present

## 2022-06-11 DIAGNOSIS — G9341 Metabolic encephalopathy: Secondary | ICD-10-CM | POA: Diagnosis not present

## 2022-06-11 DIAGNOSIS — M62562 Muscle wasting and atrophy, not elsewhere classified, left lower leg: Secondary | ICD-10-CM | POA: Diagnosis not present

## 2022-06-11 DIAGNOSIS — M6389 Disorders of muscle in diseases classified elsewhere, multiple sites: Secondary | ICD-10-CM | POA: Diagnosis not present

## 2022-06-11 DIAGNOSIS — M62561 Muscle wasting and atrophy, not elsewhere classified, right lower leg: Secondary | ICD-10-CM | POA: Diagnosis not present

## 2022-06-11 DIAGNOSIS — R4189 Other symptoms and signs involving cognitive functions and awareness: Secondary | ICD-10-CM | POA: Diagnosis not present

## 2022-06-11 DIAGNOSIS — K810 Acute cholecystitis: Secondary | ICD-10-CM | POA: Diagnosis not present

## 2022-06-12 DIAGNOSIS — G9341 Metabolic encephalopathy: Secondary | ICD-10-CM | POA: Diagnosis not present

## 2022-06-12 DIAGNOSIS — K810 Acute cholecystitis: Secondary | ICD-10-CM | POA: Diagnosis not present

## 2022-06-12 DIAGNOSIS — M62562 Muscle wasting and atrophy, not elsewhere classified, left lower leg: Secondary | ICD-10-CM | POA: Diagnosis not present

## 2022-06-12 DIAGNOSIS — M6389 Disorders of muscle in diseases classified elsewhere, multiple sites: Secondary | ICD-10-CM | POA: Diagnosis not present

## 2022-06-12 DIAGNOSIS — M62561 Muscle wasting and atrophy, not elsewhere classified, right lower leg: Secondary | ICD-10-CM | POA: Diagnosis not present

## 2022-06-12 DIAGNOSIS — R278 Other lack of coordination: Secondary | ICD-10-CM | POA: Diagnosis not present

## 2022-06-12 NOTE — Progress Notes (Deleted)
This encounter was created in error - please disregard.

## 2022-06-13 DIAGNOSIS — K810 Acute cholecystitis: Secondary | ICD-10-CM | POA: Diagnosis not present

## 2022-06-13 DIAGNOSIS — M62562 Muscle wasting and atrophy, not elsewhere classified, left lower leg: Secondary | ICD-10-CM | POA: Diagnosis not present

## 2022-06-13 DIAGNOSIS — R278 Other lack of coordination: Secondary | ICD-10-CM | POA: Diagnosis not present

## 2022-06-13 DIAGNOSIS — G9341 Metabolic encephalopathy: Secondary | ICD-10-CM | POA: Diagnosis not present

## 2022-06-13 DIAGNOSIS — M62561 Muscle wasting and atrophy, not elsewhere classified, right lower leg: Secondary | ICD-10-CM | POA: Diagnosis not present

## 2022-06-13 DIAGNOSIS — M6389 Disorders of muscle in diseases classified elsewhere, multiple sites: Secondary | ICD-10-CM | POA: Diagnosis not present

## 2022-06-14 DIAGNOSIS — K810 Acute cholecystitis: Secondary | ICD-10-CM | POA: Diagnosis not present

## 2022-06-14 DIAGNOSIS — G9341 Metabolic encephalopathy: Secondary | ICD-10-CM | POA: Diagnosis not present

## 2022-06-14 DIAGNOSIS — M62561 Muscle wasting and atrophy, not elsewhere classified, right lower leg: Secondary | ICD-10-CM | POA: Diagnosis not present

## 2022-06-14 DIAGNOSIS — R278 Other lack of coordination: Secondary | ICD-10-CM | POA: Diagnosis not present

## 2022-06-14 DIAGNOSIS — M62562 Muscle wasting and atrophy, not elsewhere classified, left lower leg: Secondary | ICD-10-CM | POA: Diagnosis not present

## 2022-06-14 DIAGNOSIS — M6389 Disorders of muscle in diseases classified elsewhere, multiple sites: Secondary | ICD-10-CM | POA: Diagnosis not present

## 2022-06-15 DIAGNOSIS — K810 Acute cholecystitis: Secondary | ICD-10-CM | POA: Diagnosis not present

## 2022-06-15 DIAGNOSIS — M62562 Muscle wasting and atrophy, not elsewhere classified, left lower leg: Secondary | ICD-10-CM | POA: Diagnosis not present

## 2022-06-15 DIAGNOSIS — M6389 Disorders of muscle in diseases classified elsewhere, multiple sites: Secondary | ICD-10-CM | POA: Diagnosis not present

## 2022-06-15 DIAGNOSIS — G9341 Metabolic encephalopathy: Secondary | ICD-10-CM | POA: Diagnosis not present

## 2022-06-15 DIAGNOSIS — R278 Other lack of coordination: Secondary | ICD-10-CM | POA: Diagnosis not present

## 2022-06-15 DIAGNOSIS — M62561 Muscle wasting and atrophy, not elsewhere classified, right lower leg: Secondary | ICD-10-CM | POA: Diagnosis not present

## 2022-06-18 DIAGNOSIS — M62561 Muscle wasting and atrophy, not elsewhere classified, right lower leg: Secondary | ICD-10-CM | POA: Diagnosis not present

## 2022-06-18 DIAGNOSIS — M6389 Disorders of muscle in diseases classified elsewhere, multiple sites: Secondary | ICD-10-CM | POA: Diagnosis not present

## 2022-06-18 DIAGNOSIS — R278 Other lack of coordination: Secondary | ICD-10-CM | POA: Diagnosis not present

## 2022-06-18 DIAGNOSIS — K810 Acute cholecystitis: Secondary | ICD-10-CM | POA: Diagnosis not present

## 2022-06-18 DIAGNOSIS — M62562 Muscle wasting and atrophy, not elsewhere classified, left lower leg: Secondary | ICD-10-CM | POA: Diagnosis not present

## 2022-06-18 DIAGNOSIS — G9341 Metabolic encephalopathy: Secondary | ICD-10-CM | POA: Diagnosis not present

## 2022-06-18 NOTE — Addendum Note (Signed)
Addended by: Georgina Snell on: 06/18/2022 09:39 PM   Modules accepted: Level of Service

## 2022-06-19 ENCOUNTER — Other Ambulatory Visit: Payer: Self-pay | Admitting: Orthopedic Surgery

## 2022-06-19 DIAGNOSIS — M62561 Muscle wasting and atrophy, not elsewhere classified, right lower leg: Secondary | ICD-10-CM | POA: Diagnosis not present

## 2022-06-19 DIAGNOSIS — M62562 Muscle wasting and atrophy, not elsewhere classified, left lower leg: Secondary | ICD-10-CM | POA: Diagnosis not present

## 2022-06-19 DIAGNOSIS — G40209 Localization-related (focal) (partial) symptomatic epilepsy and epileptic syndromes with complex partial seizures, not intractable, without status epilepticus: Secondary | ICD-10-CM

## 2022-06-19 DIAGNOSIS — R278 Other lack of coordination: Secondary | ICD-10-CM | POA: Diagnosis not present

## 2022-06-19 DIAGNOSIS — M6389 Disorders of muscle in diseases classified elsewhere, multiple sites: Secondary | ICD-10-CM | POA: Diagnosis not present

## 2022-06-19 DIAGNOSIS — G9341 Metabolic encephalopathy: Secondary | ICD-10-CM | POA: Diagnosis not present

## 2022-06-19 DIAGNOSIS — K810 Acute cholecystitis: Secondary | ICD-10-CM | POA: Diagnosis not present

## 2022-06-19 MED ORDER — PHENOBARBITAL 64.8 MG PO TABS: 64.8000 mg | ORAL_TABLET | Freq: Every morning | ORAL | 5 refills | Status: DC

## 2022-06-20 DIAGNOSIS — M62562 Muscle wasting and atrophy, not elsewhere classified, left lower leg: Secondary | ICD-10-CM | POA: Diagnosis not present

## 2022-06-20 DIAGNOSIS — M62561 Muscle wasting and atrophy, not elsewhere classified, right lower leg: Secondary | ICD-10-CM | POA: Diagnosis not present

## 2022-06-20 DIAGNOSIS — G9341 Metabolic encephalopathy: Secondary | ICD-10-CM | POA: Diagnosis not present

## 2022-06-20 DIAGNOSIS — M6389 Disorders of muscle in diseases classified elsewhere, multiple sites: Secondary | ICD-10-CM | POA: Diagnosis not present

## 2022-06-20 DIAGNOSIS — K810 Acute cholecystitis: Secondary | ICD-10-CM | POA: Diagnosis not present

## 2022-06-20 DIAGNOSIS — R278 Other lack of coordination: Secondary | ICD-10-CM | POA: Diagnosis not present

## 2022-06-21 DIAGNOSIS — M62562 Muscle wasting and atrophy, not elsewhere classified, left lower leg: Secondary | ICD-10-CM | POA: Diagnosis not present

## 2022-06-21 DIAGNOSIS — R278 Other lack of coordination: Secondary | ICD-10-CM | POA: Diagnosis not present

## 2022-06-21 DIAGNOSIS — G9341 Metabolic encephalopathy: Secondary | ICD-10-CM | POA: Diagnosis not present

## 2022-06-21 DIAGNOSIS — M6389 Disorders of muscle in diseases classified elsewhere, multiple sites: Secondary | ICD-10-CM | POA: Diagnosis not present

## 2022-06-21 DIAGNOSIS — K810 Acute cholecystitis: Secondary | ICD-10-CM | POA: Diagnosis not present

## 2022-06-21 DIAGNOSIS — M62561 Muscle wasting and atrophy, not elsewhere classified, right lower leg: Secondary | ICD-10-CM | POA: Diagnosis not present

## 2022-06-22 ENCOUNTER — Ambulatory Visit: Payer: Medicare Other | Admitting: Neurology

## 2022-06-22 DIAGNOSIS — G9341 Metabolic encephalopathy: Secondary | ICD-10-CM | POA: Diagnosis not present

## 2022-06-22 DIAGNOSIS — K810 Acute cholecystitis: Secondary | ICD-10-CM | POA: Diagnosis not present

## 2022-06-22 DIAGNOSIS — R278 Other lack of coordination: Secondary | ICD-10-CM | POA: Diagnosis not present

## 2022-06-22 DIAGNOSIS — M62562 Muscle wasting and atrophy, not elsewhere classified, left lower leg: Secondary | ICD-10-CM | POA: Diagnosis not present

## 2022-06-22 DIAGNOSIS — M62561 Muscle wasting and atrophy, not elsewhere classified, right lower leg: Secondary | ICD-10-CM | POA: Diagnosis not present

## 2022-06-22 DIAGNOSIS — M6389 Disorders of muscle in diseases classified elsewhere, multiple sites: Secondary | ICD-10-CM | POA: Diagnosis not present

## 2022-06-22 NOTE — Progress Notes (Unsigned)
Cardiology Clinic Note   Patient Name: [name] Date of Encounter: 06/25/2022  Primary Care Provider:  Virgie Dad, MD Primary Cardiologist:  Peter Martinique, MD  Patient Profile    [name] 87 year old female presents the clinic today for follow-up evaluation of her essential hypertension mitral valve prolapse, and paroxysmal atrial fibrillation.  Past Medical History    Past Medical History:  Diagnosis Date   Abnormal cardiovascular stress test    Carotid arterial disease (HCC)    MODERATE RIGHT   Cerebral AV malformation    DDD (degenerative disc disease)    SEVERE WITH SPINAL STENOSIS   Hypercholesterolemia    Hypertension    MVP (mitral valve prolapse)    MILD   Numbness    LEFT FOOT AND ANKLE   Osteopenia    Presence of permanent cardiac pacemaker    Pseudo-gout    Past Surgical History:  Procedure Laterality Date   APPENDECTOMY     BREAST MASS EXCISION     CARPAL TUNNEL RELEASE     CHOLECYSTECTOMY N/A 04/23/2022   Procedure: LAPAROSCOPIC CHOLECYSTECTOMY;  Surgeon: Coralie Keens, MD;  Location: [facility];  Service: General;  Laterality: N/A;   PACEMAKER IMPLANT N/A 11/07/2021   Procedure: PACEMAKER IMPLANT;  Surgeon: Evans Lance, MD;  Location: [facility];  Service: Cardiovascular;  Laterality: N/A;   REMOVAL OF PARATHYROID GLAND     STRESS NUCLEAR STUDY     ABNORMAL   TONSILLECTOMY      Allergies  Allergies  Allergen Reactions   Aleve [Naproxen] Other (See Comments)   Azithromycin Other (See Comments)    Reaction not recalled   Penicillins Hives and Other (See Comments)    Bad reaction as a child after a shot of Penicillin   Pravastatin Other (See Comments)    Reaction not recalled   Sulfa Antibiotics Other (See Comments)   Sulfa Drugs Cross Reactors Other (See Comments)    Reaction not recalled- stated she can take it "in small doses"   Tape Itching and Other (See Comments)    Cannot wear for an extended period of  time   Naproxen Sodium Rash    History of Present Illness    [name] is a PMH of hypertension, carotid artery disease, mitral valve prolapse, cerebral AV malformation, acute pericarditis, AV complete heart block, acute cholecystitis, neuropathy, acute kidney injury superimposed on chronic kidney disease, hypercholesterolemia, low back pain, hypomagnesemia, diarrhea, elevated LFTs, iron deficiency anemia, syncope, dyslipidemia, and is status post PPM.  Has a history of mildly abnormal nuclear stress test in 2010.  She is not on aspirin due to history of cerebral AV malformation.  She lives at [facility] assisted [facility].  Also has a history of recurrent syncope.  She was hospitalized 2/22 with syncope, confusion and hyponatremia following diarrheal illness.  Carotid Doppler 7/22 showed 40-59% B ICA stenosis which was stable from previous study.  She presented to the ED 5/23 with syncope.  Her echocardiogram showed an EF of 45-50%, LVH, normal RV function, mild MR, pericardial effusion.  On telemetry she was noted to have frequent PVCs.  She was hospitalized 10/15/2021 until 10/17/2021.  She was discharged back to assisted living.  She wore a 14-day live Zio patch which showed intermittent complete heart block up to 10 seconds.  She followed up with Dr. Lovena Le on 11/03/2021 who recommended PPM for complete heart block.  She underwent implantation on 11/07/2021.  She was discharged to  skilled nursing facility on 11/07/2021.  She tolerated PPM insertion well.  On 11/14/2021 she was noted to have new onset atrial fibrillation.  She was seen in the A-fib clinic 7/23 and was doing well.  She is not a candidate for anticoagulation due to her cerebral AVMs.  She is not a candidate for Watchman device since she cannot take antiplatelet or anticoagulants.  She was admitted 01/27/2022 with pleuritic chest pain.  She was ruled out for MI.  Her coronary CTA showed nonobstructive CAD.  She was noted to have moderate  pericardial effusion which was confirmed via echocardiogram.  At that time she was noted to have normal PPM parameters suggesting no lead migration.  Her pericarditis was treated with colchicine.  She followed up with Dr. Martinique 03/05/2022.  During that time she denied recurrent syncope.  Her last device check showed A-fib burden of about 60% which was rate controlled.  She was completely asymptomatic.  She was placed on amiodarone 12 days prior to the visit.  This correlated with GI upset.  Amiodarone was discontinued.  Rate control was felt to be best course of treatment.  Follow-up echocardiogram 02/13/2022 showed no change in moderate effusion, no evidence of tamponade, and plan for repeat echocardiogram 3/24 was made.  She denied chest pain.  She presents to the clinic today for follow-up evaluation and states she is waiting on getting into a group exercise program at [facility].  She reports that she had recent surgery.  On review of the paperwork that she presented with, she underwent lap chole on 04/23/2022.  Postoperatively she was noted to have a seizure.  She reports she has follow-up with neurology plan.  She denies cardiac awareness.  She is noted to have bilateral ankle edema.  She is compliant with lower extremity support stockings.  She denies chest pressure and pain.  She denies palpitations.  She continues to ambulate with a rollator.  We will plan for follow-up echocardiogram 3/24 and have her follow-up after testing.  Today she denies chest pain, shortness of breath, fatigue, palpitations, melena, hematuria, hemoptysis, diaphoresis, weakness, presyncope, syncope, orthopnea, and PND.     Home Medications    Prior to Admission medications   Medication Sig Start Date End Date Taking? Authorizing Provider  acetaminophen (TYLENOL) 500 MG tablet Take 500 mg by mouth every 6 (six) hours as needed for mild pain or headache.    [provider]  B Complex Vitamins (VITAMIN-B COMPLEX)  TABS Take 1 tablet by mouth daily.    [provider]  Dextromethorphan-guaiFENesin (ROBAFEN DM) 10-100 MG/5ML liquid Take 10 mLs by mouth every 6 (six) hours as needed (cough/congestion).    [provider]  fluticasone (FLONASE) 50 MCG/ACT nasal spray Place 1-2 sprays into both nostrils daily.    [provider]  hydrALAZINE (APRESOLINE) 50 MG tablet Take 1 tablet (50 mg total) by mouth 4 (four) times daily. 06/08/22 07/08/22  Royal Hawthorn, NP  hydrocortisone cream 1 % Apply 1 Application topically 3 (three) times daily as needed for itching. 05/09/22   Fargo, Amy E, NP  ipratropium-albuterol (DUONEB) 0.5-2.5 (3) MG/3ML SOLN Take 3 mLs by nebulization every 6 (six) hours as needed.    [provider]  loperamide (IMODIUM A-D) 2 MG tablet Take 2 mg by mouth every 4 (four) hours as needed for diarrhea or loose stools.    [provider]  magnesium oxide (MAG-OX) 400 (240 Mg) MG tablet Take 400 mg by mouth daily.  [provider]  Multiple Vitamins-Minerals (MULTI COMPLETE/IRON) TABS Take 1 tablet by mouth daily with breakfast. Folic acid included    [provider]  Nutritional Supplements (NUTRITIONAL DRINK PO) Take 1 Bottle by mouth daily. Chocolate Boost or Ensure with lunch    [provider]  ondansetron (ZOFRAN-ODT) 4 MG disintegrating tablet Take 4 mg by mouth every 4 (four) hours as needed for nausea or vomiting. 02/20/22   [provider]  PHENobarbital (LUMINAL) 64.8 MG tablet Take 1 tablet (64.8 mg total) by mouth in the morning. 06/19/22   Fargo, Amy E, NP  phenytoin (DILANTIN) 100 MG ER capsule Take 200 mg by mouth daily with breakfast.    [provider]  Polyethyl Glycol-Propyl Glycol (SYSTANE ULTRA) 0.4-0.3 % SOLN Place 1 drop into both eyes in the morning and at bedtime.    [provider]  Probiotic Product (ALIGN) 4 MG CAPS Take 4 mg by mouth daily.    [provider]   valsartan (DIOVAN) 160 MG tablet Take 1 tablet (160 mg total) by mouth daily. 12/11/21   Lenna Sciara, NP    Family History    Family History  Problem Relation Age of Onset   Stroke Mother    Lung cancer Father    Breast cancer Neg Hx    She indicated that her mother is deceased. She indicated that her father is deceased. She indicated that both of her brothers are alive. She indicated that her maternal grandmother is deceased. She indicated that her maternal grandfather is deceased. She indicated that her paternal grandmother is deceased. She indicated that her paternal grandfather is deceased. She indicated that the status of her neg hx is unknown.  Social History    Social History   Socioeconomic History   Marital status: Widowed    Spouse name: Not on file   Number of children: 3   Years of education: Not on file   Highest education level: Not on file  Occupational History   Not on file  Tobacco Use   Smoking status: Former    Packs/day: 1.00    Years: 14.00    Total pack years: 14.00    Types: Cigarettes    Quit date: 11/07/1968    Years since quitting: 53.6   Smokeless tobacco: Never  Vaping Use   Vaping Use: Never used  Substance and Sexual Activity   Alcohol use: No   Drug use: No   Sexual activity: Not on file  Other Topics Concern   Not on file  Social History Narrative   [place]: Not on file  Food Insecurity: No Food Insecurity (04/22/2022)   Hunger Vital Sign    Worried About Running Out of Food in the Last Year: Never true    [place] in the Last Year: Never true  Transportation Needs: No Transportation Needs (04/22/2022)   PRAPARE - Hydrologist (Medical): No    Lack of Transportation (Non-Medical): No  Physical Activity: Not on file  Stress: Not on file  Social Connections: Not on file  Intimate Partner Violence: Not At Risk  (04/22/2022)   Humiliation, Afraid, Rape, and Kick questionnaire    Fear of Current or Ex-Partner: No    Emotionally Abused: No    Physically Abused: No    Sexually Abused: No     Review of Systems  General:  No chills, fever, night sweats or weight changes.  Cardiovascular:  No chest pain, dyspnea on exertion, bilateral lower extremity nonpitting ankle edema, orthopnea, palpitations, paroxysmal nocturnal dyspnea. Dermatological: No rash, lesions/masses Respiratory: No cough, dyspnea Urologic: No hematuria, dysuria Abdominal:   No nausea, vomiting, diarrhea, bright red blood per rectum, melena, or hematemesis Neurologic:  No visual changes, wkns, changes in mental status. All other systems reviewed and are otherwise negative except as noted above.  Physical Exam    VS:  BP 134/60 (BP Location: Left Arm, Patient Position: Sitting, Cuff Size: Normal)   Pulse 79   Ht '5\' 6"'$  (1.676 m)   Wt 135 lb 9.6 oz (61.5 kg)   SpO2 99%   BMI 21.89 kg/m  , BMI Body mass index is 21.89 kg/m. GEN: Well nourished, well developed, in no acute distress. HEENT: normal. Neck: Supple, no JVD, carotid bruits, or masses. Cardiac: RRR, no murmurs, rubs, or gallops. No clubbing, cyanosis, edema.  Radials/DP/PT 2+ and equal bilaterally.  Respiratory:  Respirations regular and unlabored, clear to auscultation bilaterally. GI: Soft, nontender, nondistended, BS + x 4. MS: no deformity or atrophy. Skin: warm and dry, no rash. Neuro:  Strength and sensation are intact. Psych: Normal affect.  Accessory Clinical Findings    Recent Labs: 04/25/2022: Magnesium 1.9; TSH 4.325 06/01/2022: ALT 25; BUN 32; Creatinine 1.2; Hemoglobin 10.2; Platelets 254; Potassium 4.2; Sodium 136   Recent Lipid Panel    Component Value Date/Time   CHOL 208 (H) 01/28/2022 1254   TRIG 59 01/28/2022 1254   HDL 80 01/28/2022 1254   CHOLHDL 2.6 01/28/2022 1254   VLDL 12 01/28/2022 1254   LDLCALC 116 (H) 01/28/2022 1254          ECG personally reviewed by me today-none today.  Echocardiogram 02/13/2022  IMPRESSIONS     1. Moderate pericardial effusion. Unchanged from prior study. Moderate  pericardial effusion. The pericardial effusion is circumferential. There  is no evidence of cardiac tamponade.   2. Left ventricular ejection fraction, by estimation, is 55 to 60%. The  left ventricle has normal function. The left ventricle has no regional  wall motion abnormalities.   3. Right ventricular systolic function is normal. The right ventricular  size is normal. Tricuspid regurgitation signal is inadequate for assessing  PA pressure.   4. The mitral valve is myxomatous. Mild mitral valve regurgitation.   5. The inferior vena cava is normal in size with greater than 50%  respiratory variability, suggesting right atrial pressure of 3 mmHg.   Comparison(s): No significant change from prior study. 01/28/22 EF 50-55%.  Moderate pericardial effusion.   FINDINGS   Left Ventricle: Left ventricular ejection fraction, by estimation, is 55  to 60%. The left ventricle has normal function. The left ventricle has no  regional wall motion abnormalities. Global longitudinal strain performed  but not reported based on  interpreter judgement due to suboptimal tracking. The left ventricular  internal cavity size was normal in size. There is no left ventricular  hypertrophy.   Right Ventricle: The right ventricular size is normal. No increase in  right ventricular wall thickness. Right ventricular systolic function is  normal. Tricuspid regurgitation signal is inadequate for assessing PA  pressure.   Pericardium: Moderate pericardial effusion. Unchanged from prior study. A  moderately sized pericardial effusion is present. The pericardial effusion  is circumferential. There is no evidence of cardiac tamponade.   Mitral Valve: The mitral valve is myxomatous. Mild mitral valve  regurgitation. MV  peak gradient, 16.3 mmHg.    Tricuspid Valve: The tricuspid valve is grossly normal. Tricuspid valve  regurgitation is trivial. No evidence of tricuspid stenosis.   Pulmonic Valve: The pulmonic valve was grossly normal. Pulmonic valve  regurgitation is not visualized.   Aorta: The aortic root is normal in size and structure.   Venous: The inferior vena cava is normal in size with greater than 50%  respiratory variability, suggesting right atrial pressure of 3 mmHg.   Assessment & Plan   1.  Acute pericarditis, pericardial effusion-no chest pain today.  Follow-up echocardiogram showed no change in moderate pericardial effusion.  There is no evidence of tamponade.  Remains stable.  Cardiac unaware.  Heart rate today 79 bpm Plan for repeat echocardiogram 3/24  Paroxysmal atrial fibrillation-not a candidate for anticoagulation or Watchman due to cerebral AV malformation. Continue rate control therapy Follows with EP  Essential hypertension-BP today 134/60.  Well-controlled at home.  Carotid artery stenosis-denies lightheadedness, presyncope or syncope.  Carotid Doppler 7/22 showed 40-59% bilateral ICA stenosis.  Continues with bilateral carotid bruits. No plans for repeat evaluation/carotid Dopplers  Hyperlipidemia-continues on simvastatin. Heart healthy low-sodium high-fiber diet  Disposition: Follow-up with Dr. Martinique after repeat echocardiogram.   Jossie Ng. Jkai Arwood NP-C     06/25/2022, 4:17 PM [facility] [address] Office [phone] Fax [phone]    I spent 14 minutes examining this patient, reviewing medications, and using patient centered shared decision making involving her cardiac care.  Prior to her visit I spent greater than 20 minutes reviewing her past medical history,  medications, and prior cardiac tests.

## 2022-06-25 ENCOUNTER — Ambulatory Visit: Payer: Medicare Other | Attending: Cardiology | Admitting: General Practice

## 2022-06-25 ENCOUNTER — Encounter: Payer: Self-pay | Admitting: General Practice

## 2022-06-25 VITALS — BP 134/60 | HR 79 | Ht 66.0 in | Wt 135.6 lb

## 2022-06-25 DIAGNOSIS — I300 Acute nonspecific idiopathic pericarditis: Secondary | ICD-10-CM | POA: Diagnosis not present

## 2022-06-25 DIAGNOSIS — I480 Paroxysmal atrial fibrillation: Secondary | ICD-10-CM | POA: Insufficient documentation

## 2022-06-25 DIAGNOSIS — E785 Hyperlipidemia, unspecified: Secondary | ICD-10-CM

## 2022-06-25 DIAGNOSIS — K810 Acute cholecystitis: Secondary | ICD-10-CM | POA: Diagnosis not present

## 2022-06-25 DIAGNOSIS — I3139 Other pericardial effusion (noninflammatory): Secondary | ICD-10-CM | POA: Diagnosis not present

## 2022-06-25 DIAGNOSIS — M62562 Muscle wasting and atrophy, not elsewhere classified, left lower leg: Secondary | ICD-10-CM | POA: Diagnosis not present

## 2022-06-25 DIAGNOSIS — I6523 Occlusion and stenosis of bilateral carotid arteries: Secondary | ICD-10-CM

## 2022-06-25 DIAGNOSIS — I10 Essential (primary) hypertension: Secondary | ICD-10-CM | POA: Insufficient documentation

## 2022-06-25 DIAGNOSIS — M6389 Disorders of muscle in diseases classified elsewhere, multiple sites: Secondary | ICD-10-CM | POA: Diagnosis not present

## 2022-06-25 DIAGNOSIS — G9341 Metabolic encephalopathy: Secondary | ICD-10-CM | POA: Diagnosis not present

## 2022-06-25 DIAGNOSIS — R278 Other lack of coordination: Secondary | ICD-10-CM | POA: Diagnosis not present

## 2022-06-25 DIAGNOSIS — M62561 Muscle wasting and atrophy, not elsewhere classified, right lower leg: Secondary | ICD-10-CM | POA: Diagnosis not present

## 2022-06-25 NOTE — Patient Instructions (Addendum)
Medication Instructions:  The current medical regimen is effective;  continue present plan and medications as directed. Please refer to the Current Medication list given to you today.  *If you need a refill on your cardiac medications before your next appointment, please call your pharmacy*  Lab Work: NONE If you have labs (blood work) drawn today and your tests are completely normal, you will receive your results only by: [facility] (if you have MyChart) OR  A paper copy in the mail If you have any lab test that is abnormal or we need to change your treatment, we will call you to review the results.  Testing: Echocardiogram-in [place] - Your physician has requested that you have an echocardiogram. Echocardiography is a painless test that uses sound waves to create images of your heart. It provides your doctor with information about the size and shape of your heart and how well your heart's chambers and valves are working. This procedure takes approximately one hour. There are no restrictions for this procedure.   Other Instructions INCREASE PHYSICAL ACTIVITY AS TOLERATED  Follow-Up: At [facility], you and your health needs are our priority.  As part of our continuing mission to provide you with exceptional heart care, we have created designated Provider Care Teams.  These Care Teams include your primary Cardiologist (physician) and Advanced Practice Providers (APPs -  Physician Assistants and Nurse Practitioners) who all work together to provide you with the care you need, when you need it.  Your next appointment:   AFTER TESTING   Provider:   Peter Martinique, MD

## 2022-06-26 ENCOUNTER — Non-Acute Institutional Stay: Payer: Medicare Other | Admitting: Internal Medicine

## 2022-06-26 ENCOUNTER — Encounter: Payer: Self-pay | Admitting: Internal Medicine

## 2022-06-26 VITALS — BP 150/64 | HR 72 | Temp 97.7°F | Resp 17 | Ht 66.0 in | Wt 136.2 lb

## 2022-06-26 DIAGNOSIS — I10 Essential (primary) hypertension: Secondary | ICD-10-CM | POA: Diagnosis not present

## 2022-06-26 DIAGNOSIS — K810 Acute cholecystitis: Secondary | ICD-10-CM | POA: Diagnosis not present

## 2022-06-26 DIAGNOSIS — M62562 Muscle wasting and atrophy, not elsewhere classified, left lower leg: Secondary | ICD-10-CM | POA: Diagnosis not present

## 2022-06-26 DIAGNOSIS — M6389 Disorders of muscle in diseases classified elsewhere, multiple sites: Secondary | ICD-10-CM | POA: Diagnosis not present

## 2022-06-26 DIAGNOSIS — R278 Other lack of coordination: Secondary | ICD-10-CM | POA: Diagnosis not present

## 2022-06-26 DIAGNOSIS — Z9049 Acquired absence of other specified parts of digestive tract: Secondary | ICD-10-CM

## 2022-06-26 DIAGNOSIS — G9341 Metabolic encephalopathy: Secondary | ICD-10-CM | POA: Diagnosis not present

## 2022-06-26 DIAGNOSIS — I5032 Chronic diastolic (congestive) heart failure: Secondary | ICD-10-CM

## 2022-06-26 DIAGNOSIS — M62561 Muscle wasting and atrophy, not elsewhere classified, right lower leg: Secondary | ICD-10-CM | POA: Diagnosis not present

## 2022-06-26 DIAGNOSIS — I480 Paroxysmal atrial fibrillation: Secondary | ICD-10-CM

## 2022-06-26 NOTE — Progress Notes (Signed)
Location: [facility] of Service:  SNF (31)  Provider:   Code Status: DNR Goals of Care:     06/26/2022    1:40 PM  Advanced Directives  Does Patient Have a Medical Advance Directive? Yes  Type of Paramedic of [place];Living will;Out of facility DNR (pink MOST or yellow form)  Does patient want to make changes to medical advance directive? No - Patient declined  Copy of [place] in Chart? Yes - validated most recent copy scanned in chart (See row information)  Pre-existing out of facility DNR order (yellow form or pink MOST form) Yellow form placed in chart (order not valid for inpatient use)     Chief Complaint  Patient presents with   Medical Management of Chronic Issues    Follow up    HPI: Patient is a 87 y.o. female seen today for an acute visit for Follow up from Rehab Discharge  Back in her room in AL  She was Admitted in the hospital from 11/12 to 11/24 acute cholecystitis, laparoscopic cholecystectomy and possibly postprocedure seizure   11/15 patient was seen to have issues with word finding and confusion MRI was negative for any acute stroke.  EEG did not show any evidence of seizures Neurology thought patient had a breakthrough seizure due to missing antiepileptic and being on antibiotics as well as stressor from surgery No changes were made in her medications  In Rehab Hydralazine increased due to uncontrolled BP Started oN lasix due to CHF and SOB  Doing well in AL Walking with her walker Still gets SOB when she walks long distance Low Endurance No Chest pain No Diarrhea  Wt Readings from Last 3 Encounters:  06/26/22 136 lb 3.2 oz (61.8 kg)  06/25/22 135 lb 9.6 oz (61.5 kg)  06/07/22 140 lb (63.5 kg)     Also has h/o  Has history of chronic diarrhea and Previous h/o C Diff colitis S/p PPM placement on 05/30 for CHB Chest pain in 08/19 Diagnosed with Pericarditis with  Pericardial Effusion Was on Colchicine A Fib Amiodarone tried but then taken off due to GI side effects Not on anticoagulation due to cerebral AVMs.  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 teen Had a work-up at [facility] and has been on Dilantin since then History of unhealed right humerus shaft fracture Peripheral neuropathy   One son in [place] Daughter lives near the [place] near [place] On daughter died of Brain Cancer Past Medical History:  Diagnosis Date   Abnormal cardiovascular stress test    Carotid arterial disease ([place])    MODERATE RIGHT   Cerebral AV malformation    DDD (degenerative disc disease)    SEVERE WITH SPINAL STENOSIS   Hypercholesterolemia    Hypertension    MVP (mitral valve prolapse)    MILD   Numbness    LEFT FOOT AND ANKLE   Osteopenia    Presence of permanent cardiac pacemaker    Pseudo-gout     Past Surgical History:  Procedure Laterality Date   APPENDECTOMY     BREAST MASS EXCISION     CARPAL TUNNEL RELEASE     CHOLECYSTECTOMY N/A 04/23/2022   Procedure: LAPAROSCOPIC CHOLECYSTECTOMY;  Surgeon: Coralie Keens, MD;  Location: [facility];  Service: General;  Laterality: N/A;   gallbladder     PACEMAKER IMPLANT N/A 11/07/2021   Procedure: PACEMAKER IMPLANT;  Surgeon: Evans Lance, MD;  Location: [place] [facility];  Service: Cardiovascular;  Laterality: N/A;   REMOVAL OF PARATHYROID GLAND     STRESS NUCLEAR STUDY     ABNORMAL   TONSILLECTOMY      Allergies  Allergen Reactions   Aleve [Naproxen] Other (See Comments)   Azithromycin Other (See Comments)    Reaction not recalled   Penicillins Hives and Other (See Comments)    Bad reaction as a child after a shot of Penicillin   Pravastatin Other (See Comments)    Reaction not recalled   Sulfa Antibiotics Other (See Comments)   Sulfa Drugs Cross Reactors Other (See Comments)    Reaction not recalled- stated  she can take it "in small doses"   Tape Itching and Other (See Comments)    Cannot wear for an extended period of time   Naproxen Sodium Rash    Outpatient Encounter Medications as of 06/26/2022  Medication Sig   acetaminophen (TYLENOL) 500 MG tablet Take 500 mg by mouth every 6 (six) hours as needed for mild pain or headache.   B Complex Vitamins (VITAMIN-B COMPLEX) TABS Take 1 tablet by mouth daily.   fluticasone (FLONASE) 50 MCG/ACT nasal spray Place 1-2 sprays into both nostrils daily.   furosemide (LASIX) 20 MG tablet Take 20 mg by mouth daily.   hydrALAZINE (APRESOLINE) 50 MG tablet Take 1 tablet (50 mg total) by mouth 4 (four) times daily.   hydrocortisone cream 1 % Apply 1 Application topically 3 (three) times daily as needed for itching.   loperamide (IMODIUM A-D) 2 MG tablet Take 2 mg by mouth every 4 (four) hours as needed for diarrhea or loose stools.   magnesium oxide (MAG-OX) 400 (240 Mg) MG tablet Take 400 mg by mouth daily.   Multiple Vitamins-Minerals (MULTI COMPLETE/IRON) TABS Take 1 tablet by mouth daily with breakfast. Folic acid included   Nutritional Supplements (NUTRITIONAL DRINK PO) Take 1 Bottle by mouth daily. Chocolate Boost or Ensure with lunch   ondansetron (ZOFRAN-ODT) 4 MG disintegrating tablet Take 4 mg by mouth every 4 (four) hours as needed for nausea or vomiting.   PHENobarbital (LUMINAL) 64.8 MG tablet Take 1 tablet (64.8 mg total) by mouth in the morning.   phenytoin (DILANTIN) 100 MG ER capsule Take 200 mg by mouth daily with breakfast.   Polyethyl Glycol-Propyl Glycol (SYSTANE ULTRA) 0.4-0.3 % SOLN Place 1 drop into both eyes in the morning and at bedtime.   potassium chloride (KLOR-CON M) 10 MEQ tablet Take 10 mEq by mouth once.   Probiotic Product (ALIGN) 4 MG CAPS Take 4 mg by mouth daily.   valsartan (DIOVAN) 160 MG tablet Take 1 tablet (160 mg total) by mouth daily.   Dextromethorphan-guaiFENesin (ROBAFEN DM) 10-100 MG/5ML liquid Take 10 mLs by  mouth every 6 (six) hours as needed (cough/congestion). (Patient not taking: Reported on 06/26/2022)   ipratropium-albuterol (DUONEB) 0.5-2.5 (3) MG/3ML SOLN Take 3 mLs by nebulization every 6 (six) hours as needed.   No facility-administered encounter medications on file as of 06/26/2022.    Review of Systems:  Review of Systems  Constitutional:  Positive for activity change. Negative for appetite change.  HENT: Negative.    Respiratory:  Positive for shortness of breath. Negative for cough.   Cardiovascular:  Negative for leg swelling.  Gastrointestinal:  Negative for constipation.  Genitourinary: Negative.   Musculoskeletal:  Positive for gait problem. Negative for arthralgias and myalgias.  Skin: Negative.   Neurological:  Positive for weakness. Negative for dizziness.  Psychiatric/Behavioral:  Negative for confusion, dysphoric mood and sleep disturbance.     Health Maintenance  Topic Date Due   DEXA SCAN  Never done   Medicare Annual Wellness (AWV)  03/15/2022   COVID-19 Vaccine (6 - 2023-24 season) 07/12/2022 (Originally 02/09/2022)   Pneumonia Vaccine 44+ Years old (2 - PPSV23 or PCV20) 06/27/2023 (Originally 11/26/2015)   INFLUENZA VACCINE  Completed   Zoster Vaccines- Shingrix  Completed   HPV VACCINES  Aged Out   DTaP/Tdap/Td  Discontinued    Physical Exam: Vitals:   06/26/22 1332  BP: (!) 150/64  Pulse: 72  Resp: 17  Temp: 97.7 F (36.5 C)  TempSrc: Temporal  SpO2: 95%  Weight: 136 lb 3.2 oz (61.8 kg)  Height: '5\' 6"'$  (1.676 m)   Body mass index is 21.98 kg/m. Physical Exam Vitals reviewed.  Constitutional:      Appearance: Normal appearance.  HENT:     Head: Normocephalic.     Nose: Nose normal.     Mouth/Throat:     Mouth: Mucous membranes are moist.     Pharynx: Oropharynx is clear.  Eyes:     Pupils: Pupils are equal, round, and reactive to light.  Cardiovascular:     Rate and Rhythm: Normal rate and regular rhythm.     Pulses: Normal pulses.   Pulmonary:     Effort: Pulmonary effort is normal.     Breath sounds: Normal breath sounds.  Abdominal:     General: Abdomen is flat. Bowel sounds are normal.     Palpations: Abdomen is soft.  Musculoskeletal:     Cervical back: Neck supple.     Comments: Mild swelling  Skin:    General: Skin is warm.  Neurological:     General: No focal deficit present.     Mental Status: She is alert and oriented to person, place, and time.  Psychiatric:        Mood and Affect: Mood normal.        Thought Content: Thought content normal.     Labs reviewed: Basic Metabolic Panel: Recent Labs    01/27/22 1116 01/28/22 0331 04/22/22 1826 04/23/22 0811 04/24/22 0504 04/25/22 0735 04/26/22 0901 04/29/22 0436 05/07/22 0000 05/29/22 0000 06/01/22 0000  NA  --    < >  --    < > 136 135 136  --  141 138 136*  K  --    < >  --    < > 3.7 3.7 3.8  --  4.6 4.0 4.2  CL  --    < >  --    < > 103 103 105  --  103 101 99  CO2  --    < >  --    < > 22 19* 21*  --  25* 28* 28*  GLUCOSE  --    < >  --    < > 91 89 98  --   --   --   --   BUN  --    < >  --    < > 29* 29* 27*  --  30* 22* 32*  CREATININE  --    < >  --    < > 1.14* 1.32* 1.24*   < > 1.1 0.9 1.2*  CALCIUM  --    < > 8.1*   < > 8.1* 8.4* 8.6*  --  8.6* 9.2 9.1  MG 1.7  --  1.1*  --  1.8 1.9  --   --   --   --   --   PHOS  --   --   --   --  4.4 3.5  --   --   --   --   --   TSH 1.914  --   --   --   --  4.325  --   --   --   --   --    < > = values in this interval not displayed.   Liver Function Tests: Recent Labs    04/24/22 0504 04/25/22 0735 04/26/22 0901 05/07/22 0000 06/01/22 0000  AST '24 21 18 26 30  '$ ALT '16 15 12 16 25  '$ ALKPHOS 52 53 54 59 77  BILITOT 0.7 1.1 0.9  --   --   PROT 5.8* 5.9* 5.9*  --   --   ALBUMIN 2.8* 2.8* 2.6* 3.0* 3.5   Recent Labs    10/15/21 1456 01/27/22 0830 04/22/22 0434  LIPASE 54* 53* 42   Recent Labs    04/26/22 0901  AMMONIA 18   CBC: Recent Labs    04/24/22 0504  04/25/22 0735 04/26/22 0901 05/07/22 0000 05/29/22 0000 06/01/22 0000  WBC 11.3* 8.9 5.7 6.7 4.8 6.3  NEUTROABS 10.0* 7.6  --   --   --  5.20  HGB 9.1* 9.5* 10.2* 10.0* 9.7* 10.2*  HCT 28.1* 29.3* 32.4* 29* 29* 31*  MCV 104.5* 103.5* 106.2*  --   --   --   PLT 237 274 283 411* 221 254   Lipid Panel: Recent Labs    01/28/22 1254  CHOL 208*  HDL 80  LDLCALC 116*  TRIG 59  CHOLHDL 2.6   Lab Results  Component Value Date   HGBA1C 4.9 04/22/2022    Procedures since last visit: No results found.  Assessment/Plan 1. Primary hypertension BP still running high in AL Will add Diovan 80 mg at night 160 mg in AM Also oN hydralazine   2. Paroxysmal atrial fibrillation (HCC) Off amiodarone due to side effects No Anticoagulation due to AVMS    3. Chronic diastolic congestive heart failure (HCC) Continue Lasix 3/week Follow weight in AL  4. S/P laparoscopic cholecystectomy Doing well  Other issues Partial symptomatic epilepsy with complex partial seizures,  Continue Dilantin and Pheno barb her home dose      Mixed hyperlipidemia No Meds  Weakness work with therapy     . Diarrhea, unspecified type Resolved for now Uses Imodium PRN   Hypomagnesemia S/P placement of cardiac pacemaker    Labs/tests ordered:  * No order type specified * Next appt:  Visit date not found

## 2022-06-27 DIAGNOSIS — M62561 Muscle wasting and atrophy, not elsewhere classified, right lower leg: Secondary | ICD-10-CM | POA: Diagnosis not present

## 2022-06-27 DIAGNOSIS — G9341 Metabolic encephalopathy: Secondary | ICD-10-CM | POA: Diagnosis not present

## 2022-06-27 DIAGNOSIS — R278 Other lack of coordination: Secondary | ICD-10-CM | POA: Diagnosis not present

## 2022-06-27 DIAGNOSIS — K810 Acute cholecystitis: Secondary | ICD-10-CM | POA: Diagnosis not present

## 2022-06-27 DIAGNOSIS — M62562 Muscle wasting and atrophy, not elsewhere classified, left lower leg: Secondary | ICD-10-CM | POA: Diagnosis not present

## 2022-06-27 DIAGNOSIS — M6389 Disorders of muscle in diseases classified elsewhere, multiple sites: Secondary | ICD-10-CM | POA: Diagnosis not present

## 2022-06-28 DIAGNOSIS — M62562 Muscle wasting and atrophy, not elsewhere classified, left lower leg: Secondary | ICD-10-CM | POA: Diagnosis not present

## 2022-06-28 DIAGNOSIS — K810 Acute cholecystitis: Secondary | ICD-10-CM | POA: Diagnosis not present

## 2022-06-28 DIAGNOSIS — M62561 Muscle wasting and atrophy, not elsewhere classified, right lower leg: Secondary | ICD-10-CM | POA: Diagnosis not present

## 2022-06-28 DIAGNOSIS — R278 Other lack of coordination: Secondary | ICD-10-CM | POA: Diagnosis not present

## 2022-06-28 DIAGNOSIS — G9341 Metabolic encephalopathy: Secondary | ICD-10-CM | POA: Diagnosis not present

## 2022-06-28 DIAGNOSIS — M6389 Disorders of muscle in diseases classified elsewhere, multiple sites: Secondary | ICD-10-CM | POA: Diagnosis not present

## 2022-06-29 ENCOUNTER — Ambulatory Visit: Payer: Medicare Other | Attending: Internal Medicine | Admitting: Internal Medicine

## 2022-06-29 ENCOUNTER — Telehealth: Payer: Self-pay

## 2022-06-29 ENCOUNTER — Encounter: Payer: Self-pay | Admitting: Internal Medicine

## 2022-06-29 VITALS — BP 166/58 | HR 65 | Ht 66.0 in | Wt 134.0 lb

## 2022-06-29 DIAGNOSIS — I6523 Occlusion and stenosis of bilateral carotid arteries: Secondary | ICD-10-CM

## 2022-06-29 DIAGNOSIS — I442 Atrioventricular block, complete: Secondary | ICD-10-CM

## 2022-06-29 DIAGNOSIS — M6389 Disorders of muscle in diseases classified elsewhere, multiple sites: Secondary | ICD-10-CM | POA: Diagnosis not present

## 2022-06-29 DIAGNOSIS — Z950 Presence of cardiac pacemaker: Secondary | ICD-10-CM

## 2022-06-29 DIAGNOSIS — R278 Other lack of coordination: Secondary | ICD-10-CM | POA: Diagnosis not present

## 2022-06-29 DIAGNOSIS — K810 Acute cholecystitis: Secondary | ICD-10-CM | POA: Diagnosis not present

## 2022-06-29 DIAGNOSIS — I459 Conduction disorder, unspecified: Secondary | ICD-10-CM | POA: Diagnosis not present

## 2022-06-29 DIAGNOSIS — M62561 Muscle wasting and atrophy, not elsewhere classified, right lower leg: Secondary | ICD-10-CM | POA: Diagnosis not present

## 2022-06-29 DIAGNOSIS — G9341 Metabolic encephalopathy: Secondary | ICD-10-CM | POA: Diagnosis not present

## 2022-06-29 DIAGNOSIS — M62562 Muscle wasting and atrophy, not elsewhere classified, left lower leg: Secondary | ICD-10-CM | POA: Diagnosis not present

## 2022-06-29 MED ORDER — APIXABAN 2.5 MG PO TABS: 2.5000 mg | ORAL_TABLET | Freq: Two times a day (BID) | ORAL | 3 refills | Status: DC

## 2022-06-29 NOTE — Progress Notes (Signed)
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 She is a pleasant 87 yo woman with a h/o HTN, who has had recurrent syncope. She has worn a cardiac monitor demonstrating CHB with long pauses of over 10 seconds. She has RBBB. She underwent PPM insertion and has developed atrial fib.  She has minimal palpitations.   Allergies  Allergen Reactions   Aleve [Naproxen] Other (See Comments)   Azithromycin Other (See Comments)    Reaction not recalled   Penicillins Hives and Other (See Comments)    Bad reaction as a child after a shot of Penicillin   Pravastatin Other (See Comments)    Reaction not recalled   Sulfa Antibiotics Other (See Comments)   Sulfa Drugs Cross Reactors Other (See Comments)    Reaction not recalled- stated she can take it "in small doses"   Tape Itching and Other (See Comments)    Cannot wear for an extended period of time   Naproxen Sodium Rash     Current Outpatient Medications  Medication Sig Dispense Refill   acetaminophen (TYLENOL) 500 MG tablet Take 500 mg by mouth every 6 (six) hours as needed for mild pain or headache.     apixaban (ELIQUIS) 2.5 MG TABS tablet Take 1 tablet (2.5 mg total) by mouth 2 (two) times daily. 180 tablet 3   furosemide (LASIX) 20 MG tablet Take 20 mg by mouth daily.     hydrocortisone cream 1 % Apply 1 Application topically 3 (three) times daily as needed for itching. 30 g 0   ipratropium-albuterol (DUONEB) 0.5-2.5 (3) MG/3ML SOLN Take 3 mLs by nebulization every 6 (six) hours as needed.     loperamide (IMODIUM A-D) 2 MG tablet Take 2 mg by mouth every 4 (four) hours as needed for diarrhea or loose stools.     magnesium oxide (MAG-OX) 400 (240 Mg) MG tablet Take 400 mg by mouth daily.     Nutritional Supplements (NUTRITIONAL DRINK PO) Take 1 Bottle by mouth daily. Chocolate Boost or Ensure with lunch     ondansetron (ZOFRAN-ODT) 4 MG disintegrating tablet Take 4 mg by mouth every 4 (four) hours  as needed for nausea or vomiting.     potassium chloride (KLOR-CON M) 10 MEQ tablet Take 10 mEq by mouth once.     Probiotic Product (ALIGN) 4 MG CAPS Take 4 mg by mouth daily.     valsartan (DIOVAN) 160 MG tablet Take 1 tablet (160 mg total) by mouth daily. 90 tablet 30   No current facility-administered medications for this visit.     Past Medical History:  Diagnosis Date   Abnormal cardiovascular stress test    Carotid arterial disease (HCC)    MODERATE RIGHT   Cerebral AV malformation    DDD (degenerative disc disease)    SEVERE WITH SPINAL STENOSIS   Hypercholesterolemia    Hypertension    MVP (mitral valve prolapse)    MILD   Numbness    LEFT FOOT AND ANKLE   Osteopenia    Presence of permanent cardiac pacemaker    Pseudo-gout     ROS:   All systems reviewed and negative except as noted in the HPI.   Past Surgical History:  Procedure Laterality Date   APPENDECTOMY     BREAST MASS EXCISION     CARPAL TUNNEL RELEASE     CHOLECYSTECTOMY N/A 04/23/2022   Procedure: LAPAROSCOPIC CHOLECYSTECTOMY;  Surgeon: Coralie Keens, MD;  Location:  [facility];  Service: General;  Laterality: N/A;   gallbladder     PACEMAKER IMPLANT N/A 11/07/2021   Procedure: PACEMAKER IMPLANT;  Surgeon: Evans Lance, MD;  Location: [place] [facility];  Service: Cardiovascular;  Laterality: N/A;   REMOVAL OF PARATHYROID GLAND     STRESS NUCLEAR STUDY     ABNORMAL   TONSILLECTOMY       Family History  Problem Relation Age of Onset   Stroke Mother    Lung cancer Father    Breast cancer Neg Hx      Social History   Socioeconomic History   Marital status: Widowed    Spouse name: Not on file   Number of children: 3   Years of education: Not on file   Highest education level: Not on file  Occupational History   Not on file  Tobacco Use   Smoking status: Former    Packs/day: 1.00    Years: 14.00    Total pack years: 14.00    Types: Cigarettes    Quit date: 11/07/1968    Years  since quitting: 53.6   Smokeless tobacco: Never  Vaping Use   Vaping Use: Never used  Substance and Sexual Activity   Alcohol use: No   Drug use: No   Sexual activity: Not on file  Other Topics Concern   Not on file  Social History Narrative   [place] home [place] Strain: Not on file  Food Insecurity: No Food Insecurity (04/22/2022)   Hunger Vital Sign    Worried About Running Out of Food in the Last Year: Never true    [place] in the Last Year: Never true  Transportation Needs: No Transportation Needs (04/22/2022)   PRAPARE - Hydrologist (Medical): No    Lack of Transportation (Non-Medical): No  Physical Activity: Not on file  Stress: Not on file  Social Connections: Not on file  Intimate Partner Violence: Not At Risk (04/22/2022)   Humiliation, Afraid, Rape, and Kick questionnaire    Fear of Current or Ex-Partner: No    Emotionally Abused: No    Physically Abused: No    Sexually Abused: No     BP (!) 166/58   Pulse 65   Ht '5\' 6"'$  (1.676 m)   Wt 134 lb (60.8 kg)   SpO2 97%   BMI 21.63 kg/m   Physical Exam:  Well appearing NAD HEENT: Unremarkable Neck:  No JVD, no thyromegally Lymphatics:  No adenopathy Back:  No CVA tenderness Lungs:  Clear with no wheezes HEART:  Regular rate rhythm, no murmurs, no rubs, no clicks Abd:  soft, positive bowel sounds, no organomegally, no rebound, no guarding Ext:  2 plus pulses, no edema, no cyanosis, no clubbing Skin:  No rashes no nodules Neuro:  CN II through XII intact, motor grossly intact  EKG  DEVICE  Normal device function.  See PaceArt for details.   Assess/Plan:  Stokes Adams syncope - She has undergone DDD PM insertion and has not had any additional syncope. HTN - her SBP is up but diastolic bp is low.   PAF - she now appears to have persistent atrial fib. I was initially going to start her on eliquis but  review of her chart demonstrates that she has an intra-cranial AVM. She is not a candidate for Watchman due to her advanced age. I initially recommended  eliquis but will cancel due to her risk of bleeding.  Carotid vascular disease - she does not appear to be symptomatic. We will follow.   Carleene Overlie Jamielee Mchale,MD

## 2022-06-29 NOTE — Telephone Encounter (Signed)
Per Dr. Cristopher Peru at 200 pm on 06/29/2022- Patients 2.5 mg Eliquis prescription today has been cancelled.   Pt pharmacy called and cancelled prescription, and Eliquis removed from Pt MAR.    Pt called and voicemail message left at nursing station.  Daughter, Ms. [name], made aware of the discontinuation of Eliquis as well.    Pt was given 30 Day free Eliquis drug card, and Free Eliquis 2.5 mg samples.    I contacted Ms. Tiffany Perdu, LPN at Pt residence, and made her aware of discontinuation of Eliquis, and that Pt had 2.5 mg tablets of Eliquis, and free 30 day Eliquis card.  Ms. Neita Carp LPN told to not let Pt take the medication.    Ms. Neita Carp LPN stated that she will give the Eliquis and 30 day free card to their Pharmacy for disposal, and will NOT give the Pt Eliquis.    Dr. Lovena Le made aware of all steps noted above, no follow up required at this time.

## 2022-06-29 NOTE — Patient Instructions (Addendum)
Medication Instructions:  Your physician has recommended you make the following change in your medication: ( NEW ) START TAKING: Eliquis 2.5 mg THIS ORDER WAS DISCONTINUED ON 06/29/2022 AT 200 PM BY DR. GREGG TAYLOR.  THE PATIENTS PHARMACY WAS CALLED AND PRESCRIPTION WAS CANCELLED.    NOTE: Patient was given a 30 day free Eliquis card, and Free 2.5 mg Eliquis samples while in clinic.  Nani Ravens, LPN at [facility] called and told NOT to give Pt Eliquis;  Ms. Neita Carp LPN obtained all the Eliquis drug samples and 30 day free card from the medication cart, and gave them to their pharmacy for disposal.    Lab Work: None ordered.  If you have labs (blood work) drawn today and your tests are completely normal, you will receive your results only by: [facility] (if you have MyChart) OR A paper copy in the mail If you have any lab test that is abnormal or we need to change your treatment, we will call you to review the results.  Testing/Procedures: None ordered.  Follow-Up: At [facility], you and your health needs are our priority.  As part of our continuing mission to provide you with exceptional heart care, we have created designated Provider Care Teams.  These Care Teams include your primary Cardiologist (physician) and Advanced Practice Providers (APPs -  Physician Assistants and Nurse Practitioners) who all work together to provide you with the care you need, when you need it.  We recommend signing up for the patient portal called "MyChart".  Sign up information is provided on this After Visit Summary.  MyChart is used to connect with patients for Virtual Visits (Telemedicine).  Patients are able to view lab/test results, encounter notes, upcoming appointments, etc.  Non-urgent messages can be sent to your provider as well.   To learn more about what you can do with MyChart, go to [email or web address].    Your next appointment:   1 year(s)  The format for your next appointment:    In Person  Provider:   Cristopher Peru, MD{or one of the following Advanced Practice Providers on your designated Care Team:   Tommye Standard, [place] Legrand Como "Jonni Sanger" Chalmers Cater, [place]  Remote monitoring is used to monitor your Pacemaker from home. This monitoring reduces the number of office visits required to check your device to one time per year. It allows [place] to keep an eye on the functioning of your device to ensure it is working properly. You are scheduled for a device check from home on 08/08/2022. You may send your transmission at any time that day. If you have a wireless device, the transmission will be sent automatically. After your physician reviews your transmission, you will receive a postcard with your next transmission date.  Apixaban Tablets What is this medication? APIXABAN (a PIX a ban) prevents and treats blood clots. It is also used to lower the risk of stroke in people with AFib (atrial fibrillation). It belongs to a group of medications called blood thinners. This medicine may be used for other purposes; ask your health care provider or pharmacist if you have questions. COMMON BRAND NAME(S): Eliquis What should I tell my care team before I take this medication? They need to know if you have any of these conditions: Antiphospholipid antibody syndrome Bleeding disorder History of bleeding in the brain History of blood clots History of stomach bleeding Kidney disease Liver disease Mechanical heart valve Spinal surgery An unusual or allergic reaction to apixaban, other medications, foods, dyes, or  preservatives Pregnant or trying to get pregnant Breastfeeding How should I use this medication? Take this medication by mouth. For your therapy to work as well as possible, take each dose exactly as prescribed on the prescription label. Do not skip doses. Skipping doses or stopping this medication can increase your risk of a blood clot or stroke. Keep taking this medication unless your  care team tells you to stop. Take it as directed on the prescription label at the same time every day. You can take it with or without food. If it upsets your stomach, take it with food. A special MedGuide will be given to you by the pharmacist with each prescription and refill. Be sure to read this information carefully each time. Talk to your care team about the use of this medication in children. Special care may be needed. Overdosage: If you think you have taken too much of this medicine contact a poison control center or emergency room at once. NOTE: This medicine is only for you. Do not share this medicine with others. What if I miss a dose? If you miss a dose, take it as soon as you can. If it is almost time for your next dose, take only that dose. Do not take double or extra doses. What may interact with this medication? This medication may interact with the following: Aspirin and aspirin-like medications Certain medications for fungal infections like itraconazole and ketoconazole Certain medications for seizures like carbamazepine and phenytoin Certain medications for blood clots like enoxaparin, dalteparin, heparin, and warfarin Clarithromycin NSAIDs, medications for pain and inflammation, like ibuprofen or naproxen Rifampin Ritonavir St. John's wort This list may not describe all possible interactions. Give your health care provider a list of all the medicines, herbs, non-prescription drugs, or dietary supplements you use. Also tell them if you smoke, drink alcohol, or use illegal drugs. Some items may interact with your medicine. What should I watch for while using this medication? Visit your care team for regular checks on your progress. Your condition will be monitored carefully while you are receiving this medication. You may need blood work while taking this medication. Avoid sports and activities that might cause injury while you are using this medication. Severe falls or  injuries can cause unseen bleeding. Be careful when using sharp tools or knives. Consider using an Copy. Take special care brushing or flossing your teeth. Report any injuries, bruising, or red spots on the skin to your care team. If you are going to need surgery or other procedure, tell your care team that you are taking this medication. Wear a medical ID bracelet or chain. Carry a card that describes your condition. List the medications and doses you take on the card. What side effects may I notice from receiving this medication? Side effects that you should report to your care team as soon as possible: Allergic reactions--skin rash, itching, hives, swelling of the face, lips, tongue, or throat Bleeding--bloody or black, tar-like stools, vomiting blood or brown material that looks like coffee grounds, red or dark brown urine, small red or purple spots on the skin, unusual bruising or bleeding Bleeding in the brain--severe headache, stiff neck, confusion, dizziness, change in vision, numbness or weakness of the face, arm, or leg, trouble speaking, trouble walking, vomiting Heavy periods This list may not describe all possible side effects. Call your doctor for medical advice about side effects. You may report side effects to FDA at 1-800-FDA-1088. Where should I keep my medication? Keep out  of the reach of children and pets. Store at room temperature between 20 and 25 degrees C (68 and 77 degrees F). Get rid of any unused medication after the expiration date. To get rid of medications that are no longer needed or expired: Take the medication to a medication take-back program. Check with your pharmacy or law enforcement to find a location. If you cannot return the medication, check the label or package insert to see if the medication should be thrown out in the garbage or flushed down the toilet. If you are not sure, ask your care team. If it is safe to put in the trash, empty the medication  out of the container. Mix the medication with cat litter, dirt, coffee grounds, or other unwanted substance. Seal the mixture in a bag or container. Put it in the trash. NOTE: This sheet is a summary. It may not cover all possible information. If you have questions about this medicine, talk to your doctor, pharmacist, or health care provider.  2023 Elsevier/Gold Standard (2020-06-24 00:00:00)

## 2022-07-02 DIAGNOSIS — M62562 Muscle wasting and atrophy, not elsewhere classified, left lower leg: Secondary | ICD-10-CM | POA: Diagnosis not present

## 2022-07-02 DIAGNOSIS — M62561 Muscle wasting and atrophy, not elsewhere classified, right lower leg: Secondary | ICD-10-CM | POA: Diagnosis not present

## 2022-07-02 DIAGNOSIS — R278 Other lack of coordination: Secondary | ICD-10-CM | POA: Diagnosis not present

## 2022-07-02 DIAGNOSIS — M6389 Disorders of muscle in diseases classified elsewhere, multiple sites: Secondary | ICD-10-CM | POA: Diagnosis not present

## 2022-07-02 DIAGNOSIS — K810 Acute cholecystitis: Secondary | ICD-10-CM | POA: Diagnosis not present

## 2022-07-02 DIAGNOSIS — G9341 Metabolic encephalopathy: Secondary | ICD-10-CM | POA: Diagnosis not present

## 2022-07-03 DIAGNOSIS — K810 Acute cholecystitis: Secondary | ICD-10-CM | POA: Diagnosis not present

## 2022-07-03 DIAGNOSIS — M62561 Muscle wasting and atrophy, not elsewhere classified, right lower leg: Secondary | ICD-10-CM | POA: Diagnosis not present

## 2022-07-03 DIAGNOSIS — M62562 Muscle wasting and atrophy, not elsewhere classified, left lower leg: Secondary | ICD-10-CM | POA: Diagnosis not present

## 2022-07-03 DIAGNOSIS — G9341 Metabolic encephalopathy: Secondary | ICD-10-CM | POA: Diagnosis not present

## 2022-07-03 DIAGNOSIS — M6389 Disorders of muscle in diseases classified elsewhere, multiple sites: Secondary | ICD-10-CM | POA: Diagnosis not present

## 2022-07-03 DIAGNOSIS — R278 Other lack of coordination: Secondary | ICD-10-CM | POA: Diagnosis not present

## 2022-07-04 DIAGNOSIS — M62561 Muscle wasting and atrophy, not elsewhere classified, right lower leg: Secondary | ICD-10-CM | POA: Diagnosis not present

## 2022-07-04 DIAGNOSIS — R278 Other lack of coordination: Secondary | ICD-10-CM | POA: Diagnosis not present

## 2022-07-04 DIAGNOSIS — M6389 Disorders of muscle in diseases classified elsewhere, multiple sites: Secondary | ICD-10-CM | POA: Diagnosis not present

## 2022-07-04 DIAGNOSIS — G9341 Metabolic encephalopathy: Secondary | ICD-10-CM | POA: Diagnosis not present

## 2022-07-04 DIAGNOSIS — K810 Acute cholecystitis: Secondary | ICD-10-CM | POA: Diagnosis not present

## 2022-07-04 DIAGNOSIS — M62562 Muscle wasting and atrophy, not elsewhere classified, left lower leg: Secondary | ICD-10-CM | POA: Diagnosis not present

## 2022-07-05 DIAGNOSIS — M6389 Disorders of muscle in diseases classified elsewhere, multiple sites: Secondary | ICD-10-CM | POA: Diagnosis not present

## 2022-07-05 DIAGNOSIS — G9341 Metabolic encephalopathy: Secondary | ICD-10-CM | POA: Diagnosis not present

## 2022-07-05 DIAGNOSIS — K810 Acute cholecystitis: Secondary | ICD-10-CM | POA: Diagnosis not present

## 2022-07-05 DIAGNOSIS — R278 Other lack of coordination: Secondary | ICD-10-CM | POA: Diagnosis not present

## 2022-07-05 DIAGNOSIS — M62562 Muscle wasting and atrophy, not elsewhere classified, left lower leg: Secondary | ICD-10-CM | POA: Diagnosis not present

## 2022-07-05 DIAGNOSIS — M62561 Muscle wasting and atrophy, not elsewhere classified, right lower leg: Secondary | ICD-10-CM | POA: Diagnosis not present

## 2022-07-06 DIAGNOSIS — M62561 Muscle wasting and atrophy, not elsewhere classified, right lower leg: Secondary | ICD-10-CM | POA: Diagnosis not present

## 2022-07-06 DIAGNOSIS — M6389 Disorders of muscle in diseases classified elsewhere, multiple sites: Secondary | ICD-10-CM | POA: Diagnosis not present

## 2022-07-06 DIAGNOSIS — G9341 Metabolic encephalopathy: Secondary | ICD-10-CM | POA: Diagnosis not present

## 2022-07-06 DIAGNOSIS — K810 Acute cholecystitis: Secondary | ICD-10-CM | POA: Diagnosis not present

## 2022-07-06 DIAGNOSIS — M62562 Muscle wasting and atrophy, not elsewhere classified, left lower leg: Secondary | ICD-10-CM | POA: Diagnosis not present

## 2022-07-06 DIAGNOSIS — R278 Other lack of coordination: Secondary | ICD-10-CM | POA: Diagnosis not present

## 2022-07-09 DIAGNOSIS — G9341 Metabolic encephalopathy: Secondary | ICD-10-CM | POA: Diagnosis not present

## 2022-07-09 DIAGNOSIS — M6389 Disorders of muscle in diseases classified elsewhere, multiple sites: Secondary | ICD-10-CM | POA: Diagnosis not present

## 2022-07-09 DIAGNOSIS — K810 Acute cholecystitis: Secondary | ICD-10-CM | POA: Diagnosis not present

## 2022-07-09 DIAGNOSIS — R278 Other lack of coordination: Secondary | ICD-10-CM | POA: Diagnosis not present

## 2022-07-09 DIAGNOSIS — M62562 Muscle wasting and atrophy, not elsewhere classified, left lower leg: Secondary | ICD-10-CM | POA: Diagnosis not present

## 2022-07-09 DIAGNOSIS — M62561 Muscle wasting and atrophy, not elsewhere classified, right lower leg: Secondary | ICD-10-CM | POA: Diagnosis not present

## 2022-07-11 DIAGNOSIS — K810 Acute cholecystitis: Secondary | ICD-10-CM | POA: Diagnosis not present

## 2022-07-11 DIAGNOSIS — G9341 Metabolic encephalopathy: Secondary | ICD-10-CM | POA: Diagnosis not present

## 2022-07-11 DIAGNOSIS — R278 Other lack of coordination: Secondary | ICD-10-CM | POA: Diagnosis not present

## 2022-07-11 DIAGNOSIS — M6389 Disorders of muscle in diseases classified elsewhere, multiple sites: Secondary | ICD-10-CM | POA: Diagnosis not present

## 2022-07-11 DIAGNOSIS — M62561 Muscle wasting and atrophy, not elsewhere classified, right lower leg: Secondary | ICD-10-CM | POA: Diagnosis not present

## 2022-07-11 DIAGNOSIS — M62562 Muscle wasting and atrophy, not elsewhere classified, left lower leg: Secondary | ICD-10-CM | POA: Diagnosis not present

## 2022-07-13 DIAGNOSIS — R278 Other lack of coordination: Secondary | ICD-10-CM | POA: Diagnosis not present

## 2022-07-13 DIAGNOSIS — M6389 Disorders of muscle in diseases classified elsewhere, multiple sites: Secondary | ICD-10-CM | POA: Diagnosis not present

## 2022-07-13 DIAGNOSIS — G9341 Metabolic encephalopathy: Secondary | ICD-10-CM | POA: Diagnosis not present

## 2022-07-13 DIAGNOSIS — M62562 Muscle wasting and atrophy, not elsewhere classified, left lower leg: Secondary | ICD-10-CM | POA: Diagnosis not present

## 2022-07-13 DIAGNOSIS — K810 Acute cholecystitis: Secondary | ICD-10-CM | POA: Diagnosis not present

## 2022-07-13 DIAGNOSIS — R4189 Other symptoms and signs involving cognitive functions and awareness: Secondary | ICD-10-CM | POA: Diagnosis not present

## 2022-07-13 DIAGNOSIS — M62561 Muscle wasting and atrophy, not elsewhere classified, right lower leg: Secondary | ICD-10-CM | POA: Diagnosis not present

## 2022-07-16 DIAGNOSIS — R278 Other lack of coordination: Secondary | ICD-10-CM | POA: Diagnosis not present

## 2022-07-16 DIAGNOSIS — M62562 Muscle wasting and atrophy, not elsewhere classified, left lower leg: Secondary | ICD-10-CM | POA: Diagnosis not present

## 2022-07-16 DIAGNOSIS — K810 Acute cholecystitis: Secondary | ICD-10-CM | POA: Diagnosis not present

## 2022-07-16 DIAGNOSIS — M62561 Muscle wasting and atrophy, not elsewhere classified, right lower leg: Secondary | ICD-10-CM | POA: Diagnosis not present

## 2022-07-16 DIAGNOSIS — M6389 Disorders of muscle in diseases classified elsewhere, multiple sites: Secondary | ICD-10-CM | POA: Diagnosis not present

## 2022-07-16 DIAGNOSIS — G9341 Metabolic encephalopathy: Secondary | ICD-10-CM | POA: Diagnosis not present

## 2022-07-17 DIAGNOSIS — D225 Melanocytic nevi of trunk: Secondary | ICD-10-CM | POA: Diagnosis not present

## 2022-07-17 DIAGNOSIS — L821 Other seborrheic keratosis: Secondary | ICD-10-CM | POA: Diagnosis not present

## 2022-07-17 DIAGNOSIS — D044 Carcinoma in situ of skin of scalp and neck: Secondary | ICD-10-CM | POA: Diagnosis not present

## 2022-07-17 DIAGNOSIS — Z85828 Personal history of other malignant neoplasm of skin: Secondary | ICD-10-CM | POA: Diagnosis not present

## 2022-07-17 DIAGNOSIS — L7211 Pilar cyst: Secondary | ICD-10-CM | POA: Diagnosis not present

## 2022-07-17 DIAGNOSIS — L814 Other melanin hyperpigmentation: Secondary | ICD-10-CM | POA: Diagnosis not present

## 2022-07-19 ENCOUNTER — Encounter: Payer: Self-pay | Admitting: Neurology

## 2022-07-19 ENCOUNTER — Ambulatory Visit (INDEPENDENT_AMBULATORY_CARE_PROVIDER_SITE_OTHER): Payer: Medicare Other | Admitting: Neurology

## 2022-07-19 ENCOUNTER — Encounter (HOSPITAL_COMMUNITY): Payer: Self-pay | Admitting: *Deleted

## 2022-07-19 VITALS — BP 197/51 | HR 78 | Ht 66.0 in | Wt 133.4 lb

## 2022-07-19 DIAGNOSIS — G40209 Localization-related (focal) (partial) symptomatic epilepsy and epileptic syndromes with complex partial seizures, not intractable, without status epilepticus: Secondary | ICD-10-CM | POA: Diagnosis not present

## 2022-07-19 DIAGNOSIS — I6523 Occlusion and stenosis of bilateral carotid arteries: Secondary | ICD-10-CM | POA: Diagnosis not present

## 2022-07-19 DIAGNOSIS — Q282 Arteriovenous malformation of cerebral vessels: Secondary | ICD-10-CM

## 2022-07-19 NOTE — Patient Instructions (Signed)
Good to meet you. Continue Dilantin (Phenytoin) '200mg'$  daily and Phenobarbital 64.'8mg'$  daily. Follow-up in 1 year, call for any changes.    Seizure Precautions: 1. If medication has been prescribed for you to prevent seizures, take it exactly as directed.  Do not stop taking the medicine without talking to your doctor first, even if you have not had a seizure in a long time.   2. Avoid activities in which a seizure would cause danger to yourself or to others.  Don't operate dangerous machinery, swim alone, or climb in high or dangerous places, such as on ladders, roofs, or girders.  Do not drive unless your doctor says you may.  3. If you have any warning that you may have a seizure, lay down in a safe place where you can't hurt yourself.    4.  No driving for 6 months from last seizure, as per [facility].   Please refer to the following link on the [place] website for more information: http://[email or web address]/[email or web address]/drivingu.cfm   5.  Maintain good sleep hygiene.  6.  Contact your doctor if you have any problems that may be related to the medicine you are taking.  7.  Call 911 and bring the patient back to the ED if:        A.  The seizure lasts longer than 5 minutes.       B.  The patient doesn't awaken shortly after the seizure  C.  The patient has new problems such as difficulty seeing, speaking or moving  D.  The patient was injured during the seizure  E.  The patient has a temperature over 102 F (39C)  F.  The patient vomited and now is having trouble breathing

## 2022-07-19 NOTE — Progress Notes (Signed)
NEUROLOGY CONSULTATION NOTE  NAZIFA GONSER MRN: [id number] DOB: Jan 06, 1936  Referring provider: Dr. Veleta Miners Primary care provider: Dr. Veleta Miners  Reason for consult:  seizure  Dear Dr Lyndel Safe:  Thank you for your kind referral of Kelsey EMPEY for consultation of the above symptoms. Although her history is well known to you, please allow me to reiterate it for the purpose of our medical record. The patient was accompanied to the clinic by [facility] who also provides collateral information. Records and images were personally reviewed where available.   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 She reports that seizures started in childhood, she had a nocturnal seizure and was unresponsive, brought to [facility] where she was found to have the AVM. She was started on Phenobarbital and Phenytoin and states that she had no seizures since her teenage years, until she had a witnessed seizure post-op on 04/23/2022. She underwent laparoscopic cholecystectomy and per notes, had sudden onset difficulty with speech and confusion. She had word-finding problems, speaking short sentences that made sense but were not in the context of the conversation. She had difficulty following instructions. 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 She had an overnight EEG done which initially showed continuous generalized 3 to 6 Hz theta-delta slowing, generalized periodic discharges with triphasic morphology were noted at 1hz$ , more prominent when  awake/stimulated. Periodic discharges resolved, EEG from 11/17 to 11/18 showed diffuse slowing. Dilantin level was low at 2.6, Phenobarbital level therapeutic 18.9. Per notes, likely had a postop seizure in the setting of stress, anesthesia, and analgesics. No changes were made to her medications, she continues on Phenobarbital 64.827mdaily and Phenytoin 20064maily without side effects.   She denies any staring/unresponsive episodes, gaps in time, olfactory/gustatory hallucinations, focal numbness/tingling/weakness, myoclonic jerks. She denies any headaches, dizziness, diplopia, dysarthria/dysphagia, neck/back pain, bowel/bladder dysfunction. She always uses her walker. She had a near-fall 6 weeks ago. Sleep is good.   Epilepsy Risk Factors:  Large left frontal AVM. She had a normal birth and early development.  There is no history of febrile convulsions, CNS infections such as meningitis/encephalitis, significant traumatic brain injury, neurosurgical procedures, or family history of seizures.    PAST MEDICAL HISTORY: Past Medical History:  Diagnosis Date   Abnormal cardiovascular stress test    Carotid arterial disease (HCC)    MODERATE RIGHT   Cerebral AV malformation    DDD (degenerative disc disease)    SEVERE WITH SPINAL STENOSIS   Hypercholesterolemia    Hypertension    MVP (mitral valve prolapse)    MILD   Numbness    LEFT FOOT AND ANKLE   Osteopenia    Presence of permanent cardiac pacemaker    Pseudo-gout     PAST SURGICAL HISTORY: Past Surgical History:  Procedure Laterality Date   APPENDECTOMY     BREAST MASS EXCISION     CARPAL TUNNEL RELEASE     CHOLECYSTECTOMY N/A 04/23/2022   Procedure: LAPAROSCOPIC CHOLECYSTECTOMY;  Surgeon: BlaCoralie KeensD;  Location: [facility];  Service: General;  Laterality: N/A;   gallbladder     PACEMAKER  IMPLANT N/A 11/07/2021   Procedure: PACEMAKER IMPLANT;  Surgeon: Evans Lance, MD;  Location: [facility];  Service:  Cardiovascular;  Laterality: N/A;   REMOVAL OF PARATHYROID GLAND     STRESS NUCLEAR STUDY     ABNORMAL   TONSILLECTOMY      MEDICATIONS: Current Outpatient Medications on File Prior to Visit  Medication Sig Dispense Refill   acetaminophen (TYLENOL) 500 MG tablet Take 500 mg by mouth every 6 (six) hours as needed for mild pain or headache.     b complex vitamins capsule Take 1 capsule by mouth daily.     fluticasone (FLONASE) 50 MCG/ACT nasal spray Place into both nostrils daily.     furosemide (LASIX) 20 MG tablet Take 20 mg by mouth daily. Taking M/W/F morning     hydrALAZINE (APRESOLINE) 50 MG tablet Take 50 mg by mouth 4 (four) times daily.     hydrocortisone cream 1 % Apply 1 Application topically 3 (three) times daily as needed for itching. 30 g 0   ipratropium-albuterol (DUONEB) 0.5-2.5 (3) MG/3ML SOLN Take 3 mLs by nebulization every 6 (six) hours as needed.     loperamide (IMODIUM A-D) 2 MG tablet Take 2 mg by mouth every 4 (four) hours as needed for diarrhea or loose stools.     magnesium oxide (MAG-OX) 400 (240 Mg) MG tablet Take 400 mg by mouth daily.     Multiple Vitamins-Iron (MULTI-VITAMIN/IRON PO) Take by mouth.     Nutritional Supplements (NUTRITIONAL DRINK PO) Take 1 Bottle by mouth daily. Chocolate Boost or Ensure with lunch     ondansetron (ZOFRAN-ODT) 4 MG disintegrating tablet Take 4 mg by mouth every 4 (four) hours as needed for nausea or vomiting.     PHENobarbital (LUMINAL) 64.8 MG tablet Take 64.8 mg by mouth daily. Take 1 tablet every morning     phenytoin (DILANTIN) 100 MG ER capsule Take 200 mg by mouth daily.     Polyethyl Glycol-Propyl Glycol (SYSTANE) 0.4-0.3 % SOLN Apply to eye.     potassium chloride (KLOR-CON M) 10 MEQ tablet Take 10 mEq by mouth once.     Probiotic Product (ALIGN) 4 MG CAPS Take 4 mg by mouth daily.     valsartan (DIOVAN) 160 MG tablet Take 1 tablet (160 mg total) by mouth daily. (Patient taking differently: Take 160 mg by mouth daily.  Taking 124m in AM, 868min PM) 90 tablet 30   No current facility-administered medications on file prior to visit.    ALLERGIES: Allergies  Allergen Reactions   Aleve [Naproxen] Other (See Comments)   Azithromycin Other (See Comments)    Reaction not recalled   Penicillins Hives and Other (See Comments)    Bad reaction as a child after a shot of Penicillin   Pravastatin Other (See Comments)    Reaction not recalled   Sulfa Antibiotics Other (See Comments)   Sulfa Drugs Cross Reactors Other (See Comments)    Reaction not recalled- stated she can take it "in small doses"   Tape Itching and Other (See Comments)    Cannot wear for an extended period of time   Naproxen Sodium Rash    FAMILY HISTORY: Family History  Problem Relation Age of Onset   Stroke Mother    Lung cancer Father    Breast cancer Neg Hx     SOCIAL HISTORY: Social History   Socioeconomic History   Marital status: Widowed    Spouse name: Not on file  Number of children: 3   Years of education: Not on file   Highest education level: Not on file  Occupational History   Not on file  Tobacco Use   Smoking status: Former    Packs/day: 1.00    Years: 14.00    Total pack years: 14.00    Types: Cigarettes    Quit date: 11/07/1968    Years since quitting: 53.7   Smokeless tobacco: Never  Vaping Use   Vaping Use: Never used  Substance and Sexual Activity   Alcohol use: No   Drug use: No   Sexual activity: Not on file  Other Topics Concern   Not on file  Social History Narrative   [place] [employer]    Right handed    Social Determinants of Health   Financial Resource Strain: Not on file  Food Insecurity: No Food Insecurity (04/22/2022)   Hunger Vital Sign    Worried About Running Out of Food in the Last Year: Never true    Ran Out of Food in the Last Year: Never true  Transportation Needs: No Transportation Needs (04/22/2022)   PRAPARE - Hydrologist  (Medical): No    Lack of Transportation (Non-Medical): No  Physical Activity: Not on file  Stress: Not on file  Social Connections: Not on file  Intimate Partner Violence: Not At Risk (04/22/2022)   Humiliation, Afraid, Rape, and Kick questionnaire    Fear of Current or Ex-Partner: No    Emotionally Abused: No    Physically Abused: No    Sexually Abused: No     PHYSICAL EXAM: Vitals:   07/19/22 1029  BP: (!) 197/51  Pulse: 78  SpO2: 95%   General: No acute distress Head:  Normocephalic/atraumatic Skin/Extremities: No rash, no edema Neurological Exam: Mental status: alert and awake, no dysarthria or aphasia, Fund of knowledge is appropriate.  Attention and concentration are normal. Cranial nerves: CN I: not tested CN II: pupils equal, round, visual fields intact CN III, IV, VI:  full range of motion, no nystagmus, no ptosis CN V: facial sensation intact CN VII: upper and lower face symmetric CN VIII: hearing intact to conversation Bulk & Tone: normal, no fasciculations. Motor: 5/5 throughout with no pronator drift. Sensation: intact to light touch, cold, pin, vibration sense.  No extinction to double simultaneous stimulation.   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 She had been seizure-free since teenage years on Phenobarbital 64.37m daily and Phenytoin 2076mdaily. She had a transient episode of aphasia post-op last 04/2022, MRI brain no acute changes, felt to be focal seizure. She is doing well without any complaints. She does not drive. Follow-up  in 1 year, call for any changes.    Thank you for allowing me to participate in the care of this patient. Please do not hesitate to call for any questions or concerns.   KaEllouise NewerM.D.  CC: Dr. GuLyndel Safe

## 2022-07-20 DIAGNOSIS — G9341 Metabolic encephalopathy: Secondary | ICD-10-CM | POA: Diagnosis not present

## 2022-07-20 DIAGNOSIS — M62561 Muscle wasting and atrophy, not elsewhere classified, right lower leg: Secondary | ICD-10-CM | POA: Diagnosis not present

## 2022-07-20 DIAGNOSIS — M6389 Disorders of muscle in diseases classified elsewhere, multiple sites: Secondary | ICD-10-CM | POA: Diagnosis not present

## 2022-07-20 DIAGNOSIS — M62562 Muscle wasting and atrophy, not elsewhere classified, left lower leg: Secondary | ICD-10-CM | POA: Diagnosis not present

## 2022-07-20 DIAGNOSIS — R278 Other lack of coordination: Secondary | ICD-10-CM | POA: Diagnosis not present

## 2022-07-20 DIAGNOSIS — K810 Acute cholecystitis: Secondary | ICD-10-CM | POA: Diagnosis not present

## 2022-07-25 ENCOUNTER — Other Ambulatory Visit (HOSPITAL_COMMUNITY): Payer: Medicare Other

## 2022-07-29 ENCOUNTER — Encounter: Payer: Self-pay | Admitting: Adult Health

## 2022-07-29 NOTE — Progress Notes (Signed)
Location:  [facility]  POS: Clinic  Provider: Royal Hawthorn, ANP  Code Status: DNR Goals of Care:     07/30/2022    1:44 PM  Advanced Directives  Does Patient Have a Medical Advance Directive? Yes  Type of Paramedic of [place];Living will;Out of facility DNR (pink MOST or yellow form)  Does patient want to make changes to medical advance directive? No - Patient declined  Copy of [place] in Chart? Yes - validated most recent copy scanned in chart (See row information)  Pre-existing out of facility DNR order (yellow form or pink MOST form) Yellow form placed in chart (order not valid for inpatient use)     Chief Complaint  Patient presents with   Medical Management of Chronic Issues    1 month follow up to discuss labs    Immunizations    Discussed the need for Awv and Bone density     HPI: Patient is a 87 y.o. female seen today for medical management of chronic diseases.    She currently resides in [place] at [facility].   PMH: aortic atherosclerosis, cerebral AV malformation, HTN, HLD, Heart block AV-s/p PPM 05/30, HTN, MVP, afib, epilepsy, encephalopathy, neuropathy, anemia, and unstable gait.     She is s/p lap chole 04/23/22 and had and post op seizure.   Saw neurology 07/19/22 remains on phenobarb and dilantin, no changes made.   S/p PPM placement on 05/30 for CHB  Chest pain in 08/19 Diagnosed with Pericarditis with Pericardial Effusion Was on Colchicine  Reviewed echo 02/13/22 which showed unchanged moderate pericardial effusion EF 50-55%  Hx of cdiff and intermittent loose stools. Says that has resolved at this time.   Afib not on DOAC due to cerebral AV malformation  Bp still running high 160-180  Feels like she has no energy. This has been going on since she had gallbladder surgery. Feels tired of being tired. Doesn't want medications to fix it. No trouble sleeping. Doesn't know if she snores.   Memory loss: very  pleasant, ambulatory  MMSE 27/30 Dec of 2023 Past Medical History:  Diagnosis Date   Abnormal cardiovascular stress test    Carotid arterial disease (HCC)    MODERATE RIGHT   Cerebral AV malformation    DDD (degenerative disc disease)    SEVERE WITH SPINAL STENOSIS   Hypercholesterolemia    Hypertension    MVP (mitral valve prolapse)    MILD   Numbness    LEFT FOOT AND ANKLE   Osteopenia    Presence of permanent cardiac pacemaker    Pseudo-gout     Past Surgical History:  Procedure Laterality Date   APPENDECTOMY     BREAST MASS EXCISION     CARPAL TUNNEL RELEASE     CHOLECYSTECTOMY N/A 04/23/2022   Procedure: LAPAROSCOPIC CHOLECYSTECTOMY;  Surgeon: Coralie Keens, MD;  Location: [facility];  Service: General;  Laterality: N/A;   gallbladder     PACEMAKER IMPLANT N/A 11/07/2021   Procedure: PACEMAKER IMPLANT;  Surgeon: Evans Lance, MD;  Location: [facility] [place] [facility];  Service: Cardiovascular;  Laterality: N/A;   REMOVAL OF PARATHYROID GLAND     STRESS NUCLEAR STUDY     ABNORMAL   TONSILLECTOMY      Allergies  Allergen Reactions   Aleve [Naproxen] Other (See Comments)   Azithromycin Other (See Comments)    Reaction not recalled   Penicillins Hives and Other (See Comments)    Bad reaction as a  child after a shot of Penicillin   Pravastatin Other (See Comments)    Reaction not recalled   Sulfa Antibiotics Other (See Comments)   Sulfa Drugs Cross Reactors Other (See Comments)    Reaction not recalled- stated she can take it "in small doses"   Tape Itching and Other (See Comments)    Cannot wear for an extended period of time   Naproxen Sodium Rash    Outpatient Encounter Medications as of 07/30/2022  Medication Sig   acetaminophen (TYLENOL) 500 MG tablet Take 500 mg by mouth every 6 (six) hours as needed for mild pain or headache.   b complex vitamins capsule Take 1 capsule by mouth daily.   fluticasone (FLONASE) 50 MCG/ACT nasal spray Place into both nostrils  daily.   furosemide (LASIX) 20 MG tablet Take 20 mg by mouth daily. Taking M/W/F morning   hydrALAZINE (APRESOLINE) 50 MG tablet Take 50 mg by mouth 4 (four) times daily.   hydrocortisone cream 1 % Apply 1 Application topically 3 (three) times daily as needed for itching.   ipratropium-albuterol (DUONEB) 0.5-2.5 (3) MG/3ML SOLN Take 3 mLs by nebulization every 6 (six) hours as needed.   loperamide (IMODIUM A-D) 2 MG tablet Take 2 mg by mouth every 4 (four) hours as needed for diarrhea or loose stools.   magnesium oxide (MAG-OX) 400 (240 Mg) MG tablet Take 400 mg by mouth daily.   Multiple Vitamins-Iron (MULTI-VITAMIN/IRON PO) Take by mouth.   Nutritional Supplements (NUTRITIONAL DRINK PO) Take 1 Bottle by mouth daily. Chocolate Boost or Ensure with lunch   ondansetron (ZOFRAN-ODT) 4 MG disintegrating tablet Take 4 mg by mouth every 4 (four) hours as needed for nausea or vomiting.   PHENobarbital (LUMINAL) 64.8 MG tablet Take 64.8 mg by mouth daily. Take 1 tablet every morning   phenytoin (DILANTIN) 100 MG ER capsule Take 200 mg by mouth daily.   Polyethyl Glycol-Propyl Glycol (SYSTANE) 0.4-0.3 % SOLN Apply to eye.   potassium chloride (KLOR-CON M) 10 MEQ tablet Take 10 mEq by mouth once.   Probiotic Product (ALIGN) 4 MG CAPS Take 4 mg by mouth daily.   valsartan (DIOVAN) 160 MG tablet Take 1 tablet (160 mg total) by mouth daily. (Patient taking differently: Take 160 mg by mouth daily. Taking 130m in AM, 864min PM)   valsartan (DIOVAN) 80 MG tablet Take 80 mg by mouth daily.   No facility-administered encounter medications on file as of 07/30/2022.    Review of Systems:  Review of Systems  Constitutional:  Positive for fatigue. Negative for activity change, appetite change, chills, diaphoresis, fever and unexpected weight change.  HENT:  Negative for congestion.   Respiratory:  Negative for cough, shortness of breath and wheezing.   Cardiovascular:  Positive for leg swelling. Negative for  chest pain and palpitations.  Gastrointestinal:  Negative for abdominal distention, abdominal pain, constipation and diarrhea.  Genitourinary:  Negative for difficulty urinating and dysuria.  Musculoskeletal:  Positive for gait problem. Negative for arthralgias, back pain, joint swelling and myalgias.  Neurological:  Negative for dizziness, tremors, seizures, syncope, facial asymmetry, speech difficulty, weakness, light-headedness, numbness and headaches.  Psychiatric/Behavioral:  Negative for agitation, behavioral problems and confusion.        Memory loss    Health Maintenance  Topic Date Due   DEXA SCAN  Never done   Medicare Annual Wellness (AWV)  03/15/2022   COVID-19 Vaccine (6 - 2023-24 season) 08/12/2022 (Originally 02/09/2022)   Pneumonia Vaccine [age]Years old (  2 of 2 - PPSV23 or PCV20) 06/27/2023 (Originally 11/26/2015)   INFLUENZA VACCINE  Completed   Zoster Vaccines- Shingrix  Completed   HPV VACCINES  Aged Out   DTaP/Tdap/Td  Discontinued    Physical Exam: Vitals:   07/30/22 1342 07/30/22 1347  BP: (!) 160/60 (!) 158/56  Pulse: 65   Resp: 17   Temp: 97.7 F (36.5 C)   TempSrc: Temporal   SpO2: 94%   Weight: 139 lb (63 kg)   Height: 5' 6"$  (1.676 m)    Body mass index is 22.44 kg/m. Wt Readings from Last 3 Encounters:  07/30/22 139 lb (63 kg)  07/19/22 133 lb 6.4 oz (60.5 kg)  06/29/22 134 lb (60.8 kg)    Physical Exam Vitals and nursing note reviewed.  Constitutional:      General: She is not in acute distress.    Appearance: She is not diaphoretic.  HENT:     Head: Normocephalic and atraumatic.     Right Ear: Tympanic membrane normal.     Left Ear: Tympanic membrane normal.     Nose: Nose normal. No congestion.     Mouth/Throat:     Mouth: Mucous membranes are moist.     Pharynx: Oropharynx is clear.  Eyes:     Conjunctiva/sclera: Conjunctivae normal.     Pupils: Pupils are equal, round, and reactive to light.  Neck:     Vascular: No JVD.   Cardiovascular:     Rate and Rhythm: Normal rate. Rhythm irregular.     Heart sounds: No murmur heard. Pulmonary:     Effort: Pulmonary effort is normal. No respiratory distress.     Breath sounds: Normal breath sounds. No wheezing.  Abdominal:     General: Bowel sounds are normal. There is no distension.     Palpations: Abdomen is soft.     Tenderness: There is no abdominal tenderness.  Musculoskeletal:     Cervical back: No rigidity or tenderness.     Comments: BLE edema +2 L>R Fluid pocket at left flank and left lower abd and left eye  Lymphadenopathy:     Cervical: No cervical adenopathy.  Skin:    General: Skin is warm and dry.  Neurological:     Mental Status: She is alert and oriented to person, place, and time.     Labs reviewed: Basic Metabolic Panel: Recent Labs    01/27/22 1116 01/28/22 0331 04/22/22 1826 04/23/22 0811 04/24/22 0504 04/25/22 0735 04/26/22 0901 04/29/22 0436 05/07/22 0000 05/29/22 0000 06/01/22 0000  NA  --    < >  --    < > 136 135 136  --  141 138 136*  K  --    < >  --    < > 3.7 3.7 3.8  --  4.6 4.0 4.2  CL  --    < >  --    < > 103 103 105  --  103 101 99  CO2  --    < >  --    < > 22 19* 21*  --  25* 28* 28*  GLUCOSE  --    < >  --    < > 91 89 98  --   --   --   --   BUN  --    < >  --    < > 29* 29* 27*  --  30* 22* 32*  CREATININE  --    < >  --    < >  1.14* 1.32* 1.24*   < > 1.1 0.9 1.2*  CALCIUM  --    < > 8.1*   < > 8.1* 8.4* 8.6*  --  8.6* 9.2 9.1  MG 1.7  --  1.1*  --  1.8 1.9  --   --   --   --   --   PHOS  --   --   --   --  4.4 3.5  --   --   --   --   --   TSH 1.914  --   --   --   --  4.325  --   --   --   --   --    < > = values in this interval not displayed.   Liver Function Tests: Recent Labs    04/24/22 0504 04/25/22 0735 04/26/22 0901 05/07/22 0000 06/01/22 0000  AST 24 21 18 26 30  $ ALT 16 15 12 16 25  $ ALKPHOS 52 53 54 59 77  BILITOT 0.7 1.1 0.9  --   --   PROT 5.8* 5.9* 5.9*  --   --   ALBUMIN 2.8*  2.8* 2.6* 3.0* 3.5   Recent Labs    10/15/21 1456 01/27/22 0830 04/22/22 0434  LIPASE 54* 53* 42   Recent Labs    04/26/22 0901  AMMONIA 18   CBC: Recent Labs    04/24/22 0504 04/25/22 0735 04/26/22 0901 05/07/22 0000 05/29/22 0000 06/01/22 0000  WBC 11.3* 8.9 5.7 6.7 4.8 6.3  NEUTROABS 10.0* 7.6  --   --   --  5.20  HGB 9.1* 9.5* 10.2* 10.0* 9.7* 10.2*  HCT 28.1* 29.3* 32.4* 29* 29* 31*  MCV 104.5* 103.5* 106.2*  --   --   --   PLT 237 274 283 411* 221 254   Lipid Panel: Recent Labs    01/28/22 1254  CHOL 208*  HDL 80  LDLCALC 116*  TRIG 59  CHOLHDL 2.6   Lab Results  Component Value Date   HGBA1C 4.9 04/22/2022    Procedures since last visit: No results found.  Assessment/Plan  1. Primary hypertension Uncontrolled Still has pocket of edema in the left flank area and sometimes on the left side of her face.  Will increase lasix to 20 mg daily  2. Heart block AV complete ([place]) S/p pacemaker  3. Cerebral AV malformation hx  4. Aortic atherosclerosis ([place]) Not on statin  Not able to take asa  5. History of complex partial epilepsy No new Has seen neurology   6. Dyslipidemia Lab Results  Component Value Date   LDLCALC 116 (H) 01/28/2022   Not on statin, prior reaction   7.  Diarrhea, unspecified type Resolved   8. Anemia Macrocytic Normal B12   9. Pericardial effusion without cardiac tamponade No current symptoms Going to have f/u echo  Followed by cardiology   10. Paroxysmal atrial fibrillation (HCC) Rate is controlled  Not on DOAC due to contraindication   Labs/tests ordered:  * No order type specified *CBC CMP in am Next appt:  1 month with Dr Lyndel Safe for HTN and edema    Total time 70mn:  time greater than 50% of total time spent doing pt counseling and coordination of care

## 2022-07-30 ENCOUNTER — Encounter: Payer: Self-pay | Admitting: Adult Health

## 2022-07-30 ENCOUNTER — Non-Acute Institutional Stay: Payer: Medicare Other | Admitting: Adult Health

## 2022-07-30 VITALS — BP 158/56 | HR 65 | Temp 97.7°F | Resp 17 | Ht 66.0 in | Wt 139.0 lb

## 2022-07-30 DIAGNOSIS — I6523 Occlusion and stenosis of bilateral carotid arteries: Secondary | ICD-10-CM | POA: Diagnosis not present

## 2022-07-30 DIAGNOSIS — R197 Diarrhea, unspecified: Secondary | ICD-10-CM | POA: Diagnosis not present

## 2022-07-30 DIAGNOSIS — E785 Hyperlipidemia, unspecified: Secondary | ICD-10-CM

## 2022-07-30 DIAGNOSIS — I442 Atrioventricular block, complete: Secondary | ICD-10-CM

## 2022-07-30 DIAGNOSIS — I480 Paroxysmal atrial fibrillation: Secondary | ICD-10-CM | POA: Diagnosis not present

## 2022-07-30 DIAGNOSIS — Q282 Arteriovenous malformation of cerebral vessels: Secondary | ICD-10-CM

## 2022-07-30 DIAGNOSIS — Z8669 Personal history of other diseases of the nervous system and sense organs: Secondary | ICD-10-CM | POA: Diagnosis not present

## 2022-07-30 DIAGNOSIS — I10 Essential (primary) hypertension: Secondary | ICD-10-CM | POA: Diagnosis not present

## 2022-07-30 DIAGNOSIS — I700 Atherosclerosis of aorta: Secondary | ICD-10-CM

## 2022-07-30 DIAGNOSIS — I3139 Other pericardial effusion (noninflammatory): Secondary | ICD-10-CM

## 2022-07-31 DIAGNOSIS — R197 Diarrhea, unspecified: Secondary | ICD-10-CM | POA: Diagnosis not present

## 2022-07-31 DIAGNOSIS — R531 Weakness: Secondary | ICD-10-CM | POA: Diagnosis not present

## 2022-07-31 DIAGNOSIS — E871 Hypo-osmolality and hyponatremia: Secondary | ICD-10-CM | POA: Diagnosis not present

## 2022-07-31 LAB — HEPATIC FUNCTION PANEL
ALT: 23 U/L (ref 7–35)
AST: 32 (ref 13–35)
Alkaline Phosphatase: 63 (ref 25–125)
Bilirubin, Total: 0.3

## 2022-07-31 LAB — BASIC METABOLIC PANEL
BUN: 33 — AB (ref 4–21)
CO2: 22 (ref 13–22)
Chloride: 101 (ref 99–108)
Creatinine: 1 (ref 0.5–1.1)
Glucose: 105
Potassium: 4.4 mEq/L (ref 3.5–5.1)
Sodium: 138 (ref 137–147)

## 2022-07-31 LAB — COMPREHENSIVE METABOLIC PANEL
Albumin: 3.7 (ref 3.5–5.0)
Calcium: 9.4 (ref 8.7–10.7)
Globulin: 2.2
eGFR: 56

## 2022-08-08 ENCOUNTER — Ambulatory Visit: Payer: Medicare Other

## 2022-08-08 DIAGNOSIS — I442 Atrioventricular block, complete: Secondary | ICD-10-CM | POA: Diagnosis not present

## 2022-08-09 LAB — CUP PACEART REMOTE DEVICE CHECK
Battery Remaining Longevity: 134 mo
Battery Voltage: 3.09 V
Brady Statistic RA Percent Paced: 4.01 %
Brady Statistic RV Percent Paced: 98.79 %
Date Time Interrogation Session: 20240227213804
Implantable Lead Connection Status: 753985
Implantable Lead Connection Status: 753985
Implantable Lead Implant Date: 20230530
Implantable Lead Implant Date: 20230530
Implantable Lead Location: 753859
Implantable Lead Location: 753860
Implantable Lead Model: 3830
Implantable Lead Model: 5076
Implantable Pulse Generator Implant Date: 20230530
Lead Channel Impedance Value: 171 Ohm
Lead Channel Impedance Value: 304 Ohm
Lead Channel Impedance Value: 399 Ohm
Lead Channel Impedance Value: 418 Ohm
Lead Channel Pacing Threshold Amplitude: 1.125 V
Lead Channel Pacing Threshold Amplitude: 1.375 V
Lead Channel Pacing Threshold Pulse Width: 0.4 ms
Lead Channel Pacing Threshold Pulse Width: 0.4 ms
Lead Channel Sensing Intrinsic Amplitude: 0.25 mV
Lead Channel Sensing Intrinsic Amplitude: 0.25 mV
Lead Channel Sensing Intrinsic Amplitude: 15.125 mV
Lead Channel Sensing Intrinsic Amplitude: 16.125 mV
Lead Channel Setting Pacing Amplitude: 2 V
Lead Channel Setting Pacing Amplitude: 2.25 V
Lead Channel Setting Pacing Pulse Width: 0.4 ms
Lead Channel Setting Sensing Sensitivity: 1.2 mV
Zone Setting Status: 755011

## 2022-08-23 ENCOUNTER — Ambulatory Visit (HOSPITAL_COMMUNITY): Payer: Medicare Other | Attending: Internal Medicine

## 2022-08-23 DIAGNOSIS — I300 Acute nonspecific idiopathic pericarditis: Secondary | ICD-10-CM | POA: Insufficient documentation

## 2022-08-23 DIAGNOSIS — I3139 Other pericardial effusion (noninflammatory): Secondary | ICD-10-CM | POA: Diagnosis not present

## 2022-08-23 LAB — ECHOCARDIOGRAM COMPLETE
Area-P 1/2: 4.13 cm2
MV M vel: 4.73 m/s
MV Peak grad: 89.5 mmHg
S' Lateral: 3.1 cm

## 2022-08-27 NOTE — Progress Notes (Unsigned)
Cardiology Clinic Note   Patient Name: [name] Date of Encounter: 08/28/2022  Primary Care Provider:  Virgie Dad, MD Primary Cardiologist:  Peter Martinique, MD  Patient Profile    [name] 87 year old female presents the clinic today for follow-up evaluation of her essential hypertension mitral valve prolapse, and paroxysmal atrial fibrillation.  Past Medical History    Past Medical History:  Diagnosis Date   Abnormal cardiovascular stress test    Carotid arterial disease (HCC)    MODERATE RIGHT   Cerebral AV malformation    DDD (degenerative disc disease)    SEVERE WITH SPINAL STENOSIS   Hypercholesterolemia    Hypertension    MVP (mitral valve prolapse)    MILD   Numbness    LEFT FOOT AND ANKLE   Osteopenia    Presence of permanent cardiac pacemaker    Pseudo-gout    Past Surgical History:  Procedure Laterality Date   APPENDECTOMY     BREAST MASS EXCISION     CARPAL TUNNEL RELEASE     CHOLECYSTECTOMY N/A 04/23/2022   Procedure: LAPAROSCOPIC CHOLECYSTECTOMY;  Surgeon: Coralie Keens, MD;  Location: [facility];  Service: General;  Laterality: N/A;   gallbladder     PACEMAKER IMPLANT N/A 11/07/2021   Procedure: PACEMAKER IMPLANT;  Surgeon: Evans Lance, MD;  Location: [facility];  Service: Cardiovascular;  Laterality: N/A;   REMOVAL OF PARATHYROID GLAND     STRESS NUCLEAR STUDY     ABNORMAL   TONSILLECTOMY      Allergies  Allergies  Allergen Reactions   Aleve [Naproxen] Other (See Comments)   Azithromycin Other (See Comments)    Reaction not recalled   Penicillins Hives and Other (See Comments)    Bad reaction as a child after a shot of Penicillin   Pravastatin Other (See Comments)    Reaction not recalled   Sulfa Antibiotics Other (See Comments)   Sulfa Drugs Cross Reactors Other (See Comments)    Reaction not recalled- stated she can take it "in small doses"   Tape Itching and Other (See Comments)    Cannot wear for an  extended period of time   Naproxen Sodium Rash    History of Present Illness    [name] is a PMH of hypertension, carotid artery disease, mitral valve prolapse, cerebral AV malformation, acute pericarditis, AV complete heart block, acute cholecystitis, neuropathy, acute kidney injury superimposed on chronic kidney disease, hypercholesterolemia, low back pain, hypomagnesemia, diarrhea, elevated LFTs, iron deficiency anemia, syncope, dyslipidemia, and is status post PPM.  Has a history of mildly abnormal nuclear stress test in 2010.  She is not on aspirin due to history of cerebral AV malformation.  She lives at [facility] assisted [facility].  Also has a history of recurrent syncope.  She was hospitalized 2/22 with syncope, confusion and hyponatremia following diarrheal illness.  Carotid Doppler 7/22 showed 40-59% B ICA stenosis which was stable from previous study.  She presented to the ED 5/23 with syncope.  Her echocardiogram showed an EF of 45-50%, LVH, normal RV function, mild MR, pericardial effusion.  On telemetry she was noted to have frequent PVCs.  She was hospitalized 10/15/2021 until 10/17/2021.  She was discharged back to assisted living.  She wore a 14-day live Zio patch which showed intermittent complete heart block up to 10 seconds.  She followed up with Dr. Lovena Le on 11/03/2021 who recommended PPM for complete heart block.  She underwent implantation on 11/07/2021.  She was discharged to skilled nursing facility on 11/07/2021.  She tolerated PPM insertion well.  On 11/14/2021 she was noted to have new onset atrial fibrillation.  She was seen in the A-fib clinic 7/23 and was doing well.  She is not a candidate for anticoagulation due to her cerebral AVMs.  She is not a candidate for Watchman device since she cannot take antiplatelet or anticoagulants.  She was admitted 01/27/2022 with pleuritic chest pain.  She was ruled out for MI.  Her coronary CTA showed nonobstructive CAD.  She was noted to  have moderate pericardial effusion which was confirmed via echocardiogram.  At that time she was noted to have normal PPM parameters suggesting no lead migration.  Her pericarditis was treated with colchicine.  She followed up with Dr. Martinique 03/05/2022.  During that time she denied recurrent syncope.  Her last device check showed A-fib burden of about 60% which was rate controlled.  She was completely asymptomatic.  She was placed on amiodarone 12 days prior to the visit.  This correlated with GI upset.  Amiodarone was discontinued.  Rate control was felt to be best course of treatment.  Follow-up echocardiogram 02/13/2022 showed no change in moderate effusion, no evidence of tamponade, and plan for repeat echocardiogram 3/24 was made.  She denied chest pain.  She presented to the clinic 06/25/2022 for follow-up evaluation and stated she was waiting on getting into a group exercise program at [facility].  She reported that she had recent surgery.  On review of the paperwork that she presented with, she underwent lap chole on 04/23/2022.  Postoperatively she was noted to have a seizure.  She reported she had follow-up with neurology planned.  She denied cardiac awareness.  She was noted to have bilateral ankle edema.  She was compliant with lower extremity support stockings.  She denied chest pressure and pain.  She denied palpitations.  She continued to ambulate with a rollator.  We  planned for follow-up echocardiogram 3/24 and plan follow-up after testing.  She presents to the clinic today for follow-up evaluation.  She reports that she feels that she has less endurance.  She continues to wear lower extremity support stockings and reports that her fluid status has improved.  She continues to have generalized bilateral lower extremity edema left greater than right.  We reviewed her current medications and follow-up echocardiogram.  She expressed understanding.  I will start colchicine x 3 months and plan  follow-up in 4 months.  I will repeat a BMP today and in 2 weeks.  Today she denies chest pain, shortness of breath, fatigue, palpitations, melena, hematuria, hemoptysis, diaphoresis, weakness, presyncope, syncope, orthopnea, and PND.      Home Medications    Prior to Admission medications   Medication Sig Start Date End Date Taking? Authorizing Provider  acetaminophen (TYLENOL) 500 MG tablet Take 500 mg by mouth every 6 (six) hours as needed for mild pain or headache.    [provider]  B Complex Vitamins (VITAMIN-B COMPLEX) TABS Take 1 tablet by mouth daily.    [provider]  Dextromethorphan-guaiFENesin (ROBAFEN DM) 10-100 MG/5ML liquid Take 10 mLs by mouth every 6 (six) hours as needed (cough/congestion).    [provider]  fluticasone (FLONASE) 50 MCG/ACT nasal spray Place 1-2 sprays into both nostrils daily.    [provider]  hydrALAZINE (APRESOLINE) 50 MG tablet Take 1 tablet (50 mg total) by mouth 4 (four) times daily. 06/08/22 07/08/22  Royal Hawthorn, NP  hydrocortisone cream 1 % Apply 1 Application topically 3 (three) times daily as needed for itching. 05/09/22   Fargo, Amy E, NP  ipratropium-albuterol (DUONEB) 0.5-2.5 (3) MG/3ML SOLN Take 3 mLs by nebulization every 6 (six) hours as needed.    [provider]  loperamide (IMODIUM A-D) 2 MG tablet Take 2 mg by mouth every 4 (four) hours as needed for diarrhea or loose stools.    [provider]  magnesium oxide (MAG-OX) 400 (240 Mg) MG tablet Take 400 mg by mouth daily.    [provider]  Multiple Vitamins-Minerals (MULTI COMPLETE/IRON) TABS Take 1 tablet by mouth daily with breakfast. Folic acid included    [provider]  Nutritional Supplements (NUTRITIONAL DRINK PO) Take 1 Bottle by mouth daily. Chocolate Boost or Ensure with lunch    [provider]  ondansetron (ZOFRAN-ODT) 4 MG disintegrating tablet Take 4 mg by mouth every 4 (four) hours  as needed for nausea or vomiting. 02/20/22   [provider]  PHENobarbital (LUMINAL) 64.8 MG tablet Take 1 tablet (64.8 mg total) by mouth in the morning. 06/19/22   Fargo, Amy E, NP  phenytoin (DILANTIN) 100 MG ER capsule Take 200 mg by mouth daily with breakfast.    [provider]  Polyethyl Glycol-Propyl Glycol (SYSTANE ULTRA) 0.4-0.3 % SOLN Place 1 drop into both eyes in the morning and at bedtime.    [provider]  Probiotic Product (ALIGN) 4 MG CAPS Take 4 mg by mouth daily.    [provider]  valsartan (DIOVAN) 160 MG tablet Take 1 tablet (160 mg total) by mouth daily. 12/11/21   Lenna Sciara, NP    Family History    Family History  Problem Relation Age of Onset   Stroke Mother    Lung cancer Father    Breast cancer Neg Hx    She indicated that her mother is deceased. She indicated that her father is deceased. She indicated that both of her brothers are alive. She indicated that her maternal grandmother is deceased. She indicated that her maternal grandfather is deceased. She indicated that her paternal grandmother is deceased. She indicated that her paternal grandfather is deceased. She indicated that the status of her neg hx is unknown.  Social History    Social History   Socioeconomic History   Marital status: Widowed    Spouse name: Not on file   Number of children: 3   Years of education: Not on file   Highest education level: Not on file  Occupational History   Not on file  Tobacco Use   Smoking status: Former    Packs/day: 1.00    Years: 14.00    Additional pack years: 0.00    Total pack years: 14.00    Types: Cigarettes    Quit date: 11/07/1968    Years since quitting: 53.8   Smokeless tobacco: Never  Vaping Use   Vaping Use: Never used  Substance and Sexual Activity   Alcohol use: No   Drug use: No   Sexual activity: Not on file  Other Topics Concern   Not on file  Social History Narrative   [place] home [place]  [employer]    Right handed    Social Determinants of Health   Financial Resource Strain: Not on file  Food Insecurity: No Food Insecurity (04/22/2022)   Hunger Vital Sign    Worried About Running Out of Food in the Last Year: Never true  Ran Out of Food in the Last Year: Never true  Transportation Needs: No Transportation Needs (04/22/2022)   PRAPARE - Hydrologist (Medical): No    Lack of Transportation (Non-Medical): No  Physical Activity: Not on file  Stress: Not on file  Social Connections: Not on file  Intimate Partner Violence: Not At Risk (04/22/2022)   Humiliation, Afraid, Rape, and Kick questionnaire    Fear of Current or Ex-Partner: No    Emotionally Abused: No    Physically Abused: No    Sexually Abused: No     Review of Systems    General:  No chills, fever, night sweats or weight changes.  Cardiovascular:  No chest pain, dyspnea on exertion, bilateral lower extremity nonpitting ankle edema left greater than right, orthopnea, palpitations, paroxysmal nocturnal dyspnea. Dermatological: No rash, lesions/masses Respiratory: No cough, dyspnea Urologic: No hematuria, dysuria Abdominal:   No nausea, vomiting, diarrhea, bright red blood per rectum, melena, or hematemesis Neurologic:  No visual changes, wkns, changes in mental status. All other systems reviewed and are otherwise negative except as noted above.  Physical Exam    VS:  BP (!) 134/58   Pulse (!) 58   Ht 5\' 6"  (1.676 m)   Wt 129 lb 6.4 oz (58.7 kg)   SpO2 98%   BMI 20.89 kg/m  , BMI Body mass index is 20.89 kg/m. GEN: Well nourished, well developed, in no acute distress. HEENT: normal. Neck: Supple, no JVD, carotid bruits, or masses. Cardiac: RRR, no murmurs, rubs, or gallops. No clubbing, cyanosis, edema.  Radials/DP/PT 2+ and equal bilaterally.  Respiratory:  Respirations regular and unlabored, clear to auscultation bilaterally. GI: Soft, nontender, nondistended, BS +  x 4. MS: no deformity or atrophy. Skin: warm and dry, no rash. Neuro:  Strength and sensation are intact. Psych: Normal affect.  Accessory Clinical Findings    Recent Labs: 04/25/2022: Magnesium 1.9; TSH 4.325 06/01/2022: ALT 25; BUN 32; Creatinine 1.2; Hemoglobin 10.2; Platelets 254; Potassium 4.2; Sodium 136   Recent Lipid Panel    Component Value Date/Time   CHOL 208 (H) 01/28/2022 1254   TRIG 59 01/28/2022 1254   HDL 80 01/28/2022 1254   CHOLHDL 2.6 01/28/2022 1254   VLDL 12 01/28/2022 1254   LDLCALC 116 (H) 01/28/2022 1254         ECG personally reviewed by me today-none today.  Echocardiogram 02/13/2022  IMPRESSIONS     1. Moderate pericardial effusion. Unchanged from prior study. Moderate  pericardial effusion. The pericardial effusion is circumferential. There  is no evidence of cardiac tamponade.   2. Left ventricular ejection fraction, by estimation, is 55 to 60%. The  left ventricle has normal function. The left ventricle has no regional  wall motion abnormalities.   3. Right ventricular systolic function is normal. The right ventricular  size is normal. Tricuspid regurgitation signal is inadequate for assessing  PA pressure.   4. The mitral valve is myxomatous. Mild mitral valve regurgitation.   5. The inferior vena cava is normal in size with greater than 50%  respiratory variability, suggesting right atrial pressure of 3 mmHg.   Comparison(s): No significant change from prior study. 01/28/22 EF 50-55%.  Moderate pericardial effusion.   FINDINGS   Left Ventricle: Left ventricular ejection fraction, by estimation, is 55  to 60%. The left ventricle has normal function. The left ventricle has no  regional wall motion abnormalities. Global longitudinal strain performed  but not reported based on  interpreter  judgement due to suboptimal tracking. The left ventricular  internal cavity size was normal in size. There is no left ventricular  hypertrophy.    Right Ventricle: The right ventricular size is normal. No increase in  right ventricular wall thickness. Right ventricular systolic function is  normal. Tricuspid regurgitation signal is inadequate for assessing PA  pressure.   Pericardium: Moderate pericardial effusion. Unchanged from prior study. A  moderately sized pericardial effusion is present. The pericardial effusion  is circumferential. There is no evidence of cardiac tamponade.   Mitral Valve: The mitral valve is myxomatous. Mild mitral valve  regurgitation. MV peak gradient, 16.3 mmHg.   Tricuspid Valve: The tricuspid valve is grossly normal. Tricuspid valve  regurgitation is trivial. No evidence of tricuspid stenosis.   Pulmonic Valve: The pulmonic valve was grossly normal. Pulmonic valve  regurgitation is not visualized.   Aorta: The aortic root is normal in size and structure.   Venous: The inferior vena cava is normal in size with greater than 50%  respiratory variability, suggesting right atrial pressure of 3 mmHg.   Echocardiogram 08/23/2022  IMPRESSIONS     1. Left ventricular ejection fraction, by estimation, is 60 to 65%. The  left ventricle has normal function. The left ventricle has no regional  wall motion abnormalities. There is mild concentric left ventricular  hypertrophy. Left ventricular diastolic  function could not be evaluated. Elevated left ventricular end-diastolic  pressure.   2. Right ventricular systolic function is normal. The right ventricular  size is normal. There is severely elevated pulmonary artery systolic  pressure.   3. Left atrial size was severely dilated.   4. Right atrial size was severely dilated.   5. Mostly posterior pericardial effusion measuring 1.77 cm. Compared with  the echo 02/2022, the effusion is larger. No evidence of tamponade.  Moderate pericardial effusion. There is no evidence of cardiac tamponade.  Moderate pleural effusion in the left  lateral region.    6. The mitral valve is normal in structure. Trivial mitral valve  regurgitation. No evidence of mitral stenosis.   7. The aortic valve is tricuspid. Aortic valve regurgitation is not  visualized. No aortic stenosis is present.   8. The inferior vena cava is normal in size with <50% respiratory  variability, suggesting right atrial pressure of 8 mmHg.   FINDINGS   Left Ventricle: Left ventricular ejection fraction, by estimation, is 60  to 65%. The left ventricle has normal function. The left ventricle has no  regional wall motion abnormalities. The left ventricular internal cavity  size was normal in size. There is   mild concentric left ventricular hypertrophy. Left ventricular diastolic  function could not be evaluated due to atrial fibrillation. Left  ventricular diastolic function could not be evaluated. Elevated left  ventricular end-diastolic pressure.   Right Ventricle: The right ventricular size is normal. No increase in  right ventricular wall thickness. Right ventricular systolic function is  normal. There is severely elevated pulmonary artery systolic pressure. The  tricuspid regurgitant velocity is  3.67 m/s, and with an assumed right atrial pressure of 8 mmHg, the  estimated right ventricular systolic pressure is [id number] mmHg.   Left Atrium: Left atrial size was severely dilated.   Right Atrium: Right atrial size was severely dilated.   Pericardium: Mostly posterior pericardial effusion measuring 1.77 cm.  Compared with the echo 02/2022, the effusion is larger. No evidence of  tamponade. A moderately sized pericardial effusion is present. There is no  evidence of cardiac  tamponade.   Mitral Valve: The mitral valve is normal in structure. Trivial mitral  valve regurgitation. No evidence of mitral valve stenosis.   Tricuspid Valve: The tricuspid valve is normal in structure. Tricuspid  valve regurgitation is mild . No evidence of tricuspid stenosis.   Aortic Valve: The  aortic valve is tricuspid. Aortic valve regurgitation is  not visualized. No aortic stenosis is present.   Pulmonic Valve: The pulmonic valve was normal in structure. Pulmonic valve  regurgitation is mild to moderate. No evidence of pulmonic stenosis.   Aorta: The aortic root is normal in size and structure.   Venous: The inferior vena cava is normal in size with less than 50%  respiratory variability, suggesting right atrial pressure of 8 mmHg.   IAS/Shunts: No atrial level shunt detected by color flow Doppler.   Additional Comments: There is a moderate pleural effusion in the left  lateral region.   Assessment & Plan   1.Idiopathic acute pericarditis, pericardial effusion-no chest pain today.  Follow-up echocardiogram 08/23/2022 showed  moderate pericardial effusion increased from 02/13/2022 echocardiogram.  There is no evidence of tamponade.  She remains stable from a cardiac standpoint.  He is to be cardiac unaware.  Heart rate today 58 bpm Continue furosemide, potassium Colchicine 0.5 mg daily for 3 months Order BMP today and in 2 weeks   Essential hypertension-BP today 134/58.   Continue current medical therapy Heart healthy low-sodium diet Maintain blood pressure log  Paroxysmal atrial fibrillation-device check 08/09/2022 showed appropriate histograms, leads and battery stable with normal device function.  Known persistent A-fib.  Not a candidate for anticoagulation or Watchman due to cerebral AV malformation. Continue rate control therapy Follows with EP  Hyperlipidemia-continues on simvastatin. Heart healthy low-sodium high-fiber diet Maintain physical activity  Disposition: Follow-up with Dr. Martinique in 4 months.   Jossie Ng. Nani Ingram NP-C     08/28/2022, 11:10 AM [place] [address] Office [phone] Fax [phone]    I spent 14 minutes examining this patient, reviewing medications, and using patient centered shared  decision making involving her cardiac care.  Prior to her visit I spent greater than 20 minutes reviewing her past medical history,  medications, and prior cardiac tests.

## 2022-08-28 ENCOUNTER — Encounter: Payer: Self-pay | Admitting: General Practice

## 2022-08-28 ENCOUNTER — Encounter: Payer: Medicare Other | Admitting: Internal Medicine

## 2022-08-28 ENCOUNTER — Ambulatory Visit: Payer: Medicare Other | Attending: General Practice | Admitting: General Practice

## 2022-08-28 VITALS — BP 134/58 | HR 58 | Ht 66.0 in | Wt 129.4 lb

## 2022-08-28 DIAGNOSIS — E785 Hyperlipidemia, unspecified: Secondary | ICD-10-CM | POA: Insufficient documentation

## 2022-08-28 DIAGNOSIS — I3139 Other pericardial effusion (noninflammatory): Secondary | ICD-10-CM | POA: Diagnosis not present

## 2022-08-28 DIAGNOSIS — I10 Essential (primary) hypertension: Secondary | ICD-10-CM

## 2022-08-28 DIAGNOSIS — I480 Paroxysmal atrial fibrillation: Secondary | ICD-10-CM | POA: Insufficient documentation

## 2022-08-28 MED ORDER — COLCHICINE (CARDIOVASCULAR) 0.5 MG PO TABS: 1.0000 | ORAL_TABLET | Freq: Every day | ORAL | 0 refills | Status: DC

## 2022-08-28 NOTE — Patient Instructions (Signed)
Medication Instructions:  TAKE colchicine 0.5MG  DAILY FOR 3 MONTHS *If you need a refill on your cardiac medications before your next appointment, please call your pharmacy*  Lab Work: BMET TODAY AND IN 2 WEEKS If you have labs (blood work) drawn today and your tests are completely normal, you will receive your results only by:  [place] (if you have MyChart) OR  A paper copy in the mail  If you have any lab test that is abnormal or we need to change your treatment, we will call you to review The results.  Testing/Procedures: NONE  Follow-Up: At [facility], you and your health needs are our priority.  As part of our continuing mission to provide you with exceptional heart care, we have created designated Provider Care Teams.  These Care Teams include your primary Cardiologist (physician) and Advanced Practice Providers (APPs -  Physician Assistants and Nurse Practitioners) who all work together to provide you with the care you need, when you need it.  Your next appointment:   4 month(s)  Provider:   Peter Martinique, MD     Other Instructions INCREASE PHYSICAL ACTIVITY AS TOLERATED

## 2022-08-29 ENCOUNTER — Telehealth: Payer: Self-pay | Admitting: Cardiology

## 2022-08-29 ENCOUNTER — Other Ambulatory Visit: Payer: Self-pay

## 2022-08-29 DIAGNOSIS — Z79899 Other long term (current) drug therapy: Secondary | ICD-10-CM

## 2022-08-29 LAB — BASIC METABOLIC PANEL
BUN/Creatinine Ratio: 32 — ABNORMAL HIGH (ref 12–28)
BUN: 38 mg/dL — ABNORMAL HIGH (ref 8–27)
CO2: 20 mmol/L (ref 20–29)
Calcium: 9.7 mg/dL (ref 8.7–10.3)
Chloride: 104 mmol/L (ref 96–106)
Creatinine, Ser: 1.2 mg/dL — ABNORMAL HIGH (ref 0.57–1.00)
Glucose: 95 mg/dL (ref 70–99)
Potassium: 5.6 mmol/L — ABNORMAL HIGH (ref 3.5–5.2)
Sodium: 141 mmol/L (ref 134–144)
eGFR: 45 mL/min/{1.73_m2} — ABNORMAL LOW (ref >59)

## 2022-08-29 MED ORDER — COLCHICINE 0.6 MG PO TABS: 0.6000 mg | ORAL_TABLET | Freq: Every day | ORAL | 0 refills | Status: DC

## 2022-08-29 NOTE — Telephone Encounter (Signed)
Pt c/o medication issue:  1. Name of Medication:  Colchicine 0.5 MG  2. How are you currently taking this medication (dosage and times per day)?   3. Are you having a reaction (difficulty breathing--STAT)?   4. What is your medication issue? [facility] is calling to say they can not find this at any of the pharmacies for 0.5 mg and would like to know if this should be changed to something else to make it easier to get. Please advise.

## 2022-08-29 NOTE — Telephone Encounter (Signed)
Spoke to [facility] with [facility].Advised Colchicine is to be taken daily for 3 months then stop.

## 2022-08-29 NOTE — Telephone Encounter (Signed)
  Mayla called back, she said to add the information how long does the pt needs to take the new medication colchicine 0.6mg

## 2022-08-29 NOTE — Telephone Encounter (Signed)
Spoke with Ander Purpura and I will fax new order for colchicine 0.6mg  to [facility].  Fax# [phone]

## 2022-08-29 NOTE — Telephone Encounter (Signed)
Order faxed to [phone],  Attn Myla

## 2022-08-29 NOTE — Telephone Encounter (Signed)
New RX sent for patient colchiceine to facility.  They now ask for a new RX with end date for medication.   Fax to [facility] at [phone]

## 2022-08-29 NOTE — Telephone Encounter (Signed)
Spoke with Nurse Ander Purpura from [facility] and she stated that the pharmacy they use in [place] is having a hard time getting the Colchicine 0.5mg . She stated even the pharmacies they use for back up does not have it. She would like to know did you want to change it to 0.6mg ?

## 2022-08-29 NOTE — Telephone Encounter (Signed)
[name] called stating she still hasn't received the fax.  If it can be refaxed to them at [phone]

## 2022-09-03 DIAGNOSIS — E875 Hyperkalemia: Secondary | ICD-10-CM | POA: Diagnosis not present

## 2022-09-04 ENCOUNTER — Encounter: Payer: Self-pay | Admitting: Internal Medicine

## 2022-09-04 ENCOUNTER — Non-Acute Institutional Stay: Payer: Medicare Other | Admitting: Internal Medicine

## 2022-09-04 VITALS — BP 162/58 | HR 66 | Temp 98.0°F | Resp 17 | Ht 66.0 in | Wt 129.0 lb

## 2022-09-04 DIAGNOSIS — I5032 Chronic diastolic (congestive) heart failure: Secondary | ICD-10-CM | POA: Diagnosis not present

## 2022-09-04 DIAGNOSIS — E785 Hyperlipidemia, unspecified: Secondary | ICD-10-CM

## 2022-09-04 DIAGNOSIS — R197 Diarrhea, unspecified: Secondary | ICD-10-CM | POA: Diagnosis not present

## 2022-09-04 DIAGNOSIS — I10 Essential (primary) hypertension: Secondary | ICD-10-CM | POA: Diagnosis not present

## 2022-09-04 DIAGNOSIS — I442 Atrioventricular block, complete: Secondary | ICD-10-CM | POA: Diagnosis not present

## 2022-09-04 DIAGNOSIS — I480 Paroxysmal atrial fibrillation: Secondary | ICD-10-CM

## 2022-09-04 DIAGNOSIS — Q282 Arteriovenous malformation of cerebral vessels: Secondary | ICD-10-CM | POA: Diagnosis not present

## 2022-09-04 DIAGNOSIS — M7989 Other specified soft tissue disorders: Secondary | ICD-10-CM

## 2022-09-04 DIAGNOSIS — Z8669 Personal history of other diseases of the nervous system and sense organs: Secondary | ICD-10-CM

## 2022-09-04 DIAGNOSIS — I3139 Other pericardial effusion (noninflammatory): Secondary | ICD-10-CM | POA: Diagnosis not present

## 2022-09-04 NOTE — Progress Notes (Unsigned)
Location: [facility] of Service:  Clinic (12)  Provider:   Code Status: DNR Goals of Care:     09/04/2022   10:50 AM  Advanced Directives  Does Patient Have a Medical Advance Directive? Yes  Type of Paramedic of [place];Living will;Out of facility DNR (pink MOST or yellow form)  Does patient want to make changes to medical advance directive? No - Patient declined  Copy of [place] in Chart? Yes - validated most recent copy scanned in chart (See row information)  Pre-existing out of facility DNR order (yellow form or pink MOST form) Yellow form placed in chart (order not valid for inpatient use)     Chief Complaint  Patient presents with   Medical Management of Chronic Issues    Follow up with labs    HPI: Patient is a 87 y.o. female seen today for Routine Visit  She is in AL in [place] Also has h/o  Has history of chronic diarrhea and Previous h/o C Diff colitis S/p PPM placement on 05/30 for CHB Chest pain in 08/19 Diagnosed with Pericarditis with Pericardial Effusion A Fib Amiodarone tried but then taken off due to GI side effects Not on anticoagulation due to cerebral AVMs.  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 teen Had a work-up at [facility] and has been on Dilantin since then History of unhealed right humerus shaft fracture Peripheral neuropathy laparoscopic cholecystectomy and possibly postprocedure seizure   Acute issues  Hypertension BP still running high specially in the morning  Pericardial Effusion reoccurrence ? Idiopathic Per Cardiology Started on Colchicine few days ago Diarrhea Has h/o loose stool slight worsening since yesterday Not sure if it is new or due to Colchicine No Abdominal Pain Had just 2 episodes so far Low endurance feels tired easily LE swelling Left Leg more swollen then Right   No pain  Wt Readings from Last 3 Encounters:  09/04/22 129 lb (58.5 kg)  08/28/22 129 lb 6.4 oz (58.7 kg)  07/30/22 139 lb (63 kg)    Past Medical History:  Diagnosis Date   Abnormal cardiovascular stress test    Carotid arterial disease (HCC)    MODERATE RIGHT   Cerebral AV malformation    DDD (degenerative disc disease)    SEVERE WITH SPINAL STENOSIS   Hypercholesterolemia    Hypertension    MVP (mitral valve prolapse)    MILD   Numbness    LEFT FOOT AND ANKLE   Osteopenia    Presence of permanent cardiac pacemaker    Pseudo-gout     Past Surgical History:  Procedure Laterality Date   APPENDECTOMY     BREAST MASS EXCISION     CARPAL TUNNEL RELEASE     CHOLECYSTECTOMY N/A 04/23/2022   Procedure: LAPAROSCOPIC CHOLECYSTECTOMY;  Surgeon: Coralie Keens, MD;  Location: [facility];  Service: General;  Laterality: N/A;   gallbladder     PACEMAKER IMPLANT N/A 11/07/2021   Procedure: PACEMAKER IMPLANT;  Surgeon: Evans Lance, MD;  Location: [facility];  Service: Cardiovascular;  Laterality: N/A;   REMOVAL OF PARATHYROID GLAND     STRESS NUCLEAR STUDY     ABNORMAL   TONSILLECTOMY      Allergies  Allergen Reactions   Aleve [Naproxen] Other (See Comments)   Azithromycin Other (See Comments)    Reaction not recalled   Penicillins Hives  and Other (See Comments)    Bad reaction as a child after a shot of Penicillin   Pravastatin Other (See Comments)    Reaction not recalled   Sulfa Antibiotics Other (See Comments)   Sulfa Drugs Cross Reactors Other (See Comments)    Reaction not recalled- stated she can take it "in small doses"   Tape Itching and Other (See Comments)    Cannot wear for an extended period of time   Naproxen Sodium Rash    Outpatient Encounter Medications as of 09/04/2022  Medication Sig   acetaminophen (TYLENOL) 500 MG tablet Take 500 mg by mouth every 6 (six) hours as needed for mild pain or headache.   b complex vitamins capsule Take 1  capsule by mouth daily.   fluticasone (FLONASE) 50 MCG/ACT nasal spray Place into both nostrils daily.   furosemide (LASIX) 20 MG tablet Take 20 mg by mouth daily.   hydrALAZINE (APRESOLINE) 50 MG tablet Take 50 mg by mouth 4 (four) times daily.   ipratropium-albuterol (DUONEB) 0.5-2.5 (3) MG/3ML SOLN Take 3 mLs by nebulization every 6 (six) hours as needed.   loperamide (IMODIUM A-D) 2 MG tablet Take 2 mg by mouth every 4 (four) hours as needed for diarrhea or loose stools.   magnesium oxide (MAG-OX) 400 (240 Mg) MG tablet Take 400 mg by mouth daily.   Multiple Vitamins-Iron (MULTI-VITAMIN/IRON PO) Take by mouth.   Nutritional Supplements (NUTRITIONAL DRINK PO) Take 1 Bottle by mouth daily. Chocolate Boost or Ensure with lunch   ondansetron (ZOFRAN-ODT) 4 MG disintegrating tablet Take 4 mg by mouth every 4 (four) hours as needed for nausea or vomiting.   PHENobarbital (LUMINAL) 64.8 MG tablet Take 64.8 mg by mouth daily. Take 1 tablet every morning   phenytoin (DILANTIN) 100 MG ER capsule Take 200 mg by mouth daily.   Polyethyl Glycol-Propyl Glycol (SYSTANE) 0.4-0.3 % SOLN Apply to eye.   potassium chloride (KLOR-CON M) 10 MEQ tablet Take 20 mEq by mouth daily.   Probiotic Product (ALIGN) 4 MG CAPS Take 4 mg by mouth daily.   valsartan (DIOVAN) 160 MG tablet Take 1 tablet (160 mg total) by mouth daily. (Patient taking differently: Take 160 mg by mouth 2 (two) times daily.)   [DISCONTINUED] colchicine 0.6 MG tablet Take 1 tablet (0.6 mg total) by mouth daily.   [DISCONTINUED] colchicine 0.6 MG tablet Take 1 tablet (0.6 mg total) by mouth daily.   [DISCONTINUED] valsartan (DIOVAN) 80 MG tablet Take 80 mg by mouth daily.   hydrocortisone cream 1 % Apply 1 Application topically 3 (three) times daily as needed for itching. (Patient not taking: Reported on 09/04/2022)   No facility-administered encounter medications on file as of 09/04/2022.    Review of Systems:  Review of Systems   Constitutional:  Negative for activity change and appetite change.  HENT: Negative.    Respiratory:  Positive for shortness of breath. Negative for cough.   Cardiovascular:  Positive for leg swelling.  Gastrointestinal:  Positive for diarrhea. Negative for constipation.  Genitourinary: Negative.   Musculoskeletal:  Positive for gait problem. Negative for arthralgias and myalgias.  Skin: Negative.   Neurological:  Positive for weakness. Negative for dizziness.  Psychiatric/Behavioral:  Negative for confusion, dysphoric mood and sleep disturbance.     Health Maintenance  Topic Date Due   DEXA SCAN  Never done   Medicare Annual Wellness (AWV)  09/05/2022 (Originally 03/15/2022)   COVID-19 Vaccine (6 - 2023-24 season) 09/20/2022 (Originally 02/09/2022)   Pneumonia  Vaccine 45+ Years old (2 of 2 - PPSV23 or PCV20) 06/27/2023 (Originally 11/26/2015)   INFLUENZA VACCINE  Completed   Zoster Vaccines- Shingrix  Completed   HPV VACCINES  Aged Out   DTaP/Tdap/Td  Discontinued    Physical Exam: Vitals:   09/04/22 1050 09/04/22 1054  BP: (!) 162/60 (!) 162/58  Pulse: 66   Resp: 17   Temp: 98 F (36.7 C)   TempSrc: Temporal   SpO2: 98%   Weight: 129 lb (58.5 kg)   Height: 5\' 6"  (1.676 m)    Body mass index is 20.82 kg/m. Physical Exam Vitals reviewed.  Constitutional:      Appearance: Normal appearance.  HENT:     Head: Normocephalic.     Nose: Nose normal.     Mouth/Throat:     Mouth: Mucous membranes are moist.     Pharynx: Oropharynx is clear.  Eyes:     Pupils: Pupils are equal, round, and reactive to light.  Cardiovascular:     Rate and Rhythm: Normal rate and regular rhythm.     Pulses: Normal pulses.     Heart sounds: Normal heart sounds. No murmur heard. Pulmonary:     Effort: Pulmonary effort is normal.     Breath sounds: Normal breath sounds.  Abdominal:     General: Abdomen is flat. Bowel sounds are normal.     Palpations: Abdomen is soft.  Musculoskeletal:         General: Swelling present.     Cervical back: Neck supple.  Skin:    General: Skin is warm.  Neurological:     General: No focal deficit present.     Mental Status: She is alert and oriented to person, place, and time.  Psychiatric:        Mood and Affect: Mood normal.        Thought Content: Thought content normal.     Labs reviewed: Basic Metabolic Panel: Recent Labs    01/27/22 1116 01/28/22 0331 04/22/22 1826 04/23/22 0811 04/24/22 0504 04/25/22 0735 04/26/22 0901 04/29/22 0436 05/29/22 0000 06/01/22 0000 08/28/22 1131  NA  --    < >  --    < > 136 135 136   < > 138 136* 141  K  --    < >  --    < > 3.7 3.7 3.8   < > 4.0 4.2 5.6*  CL  --    < >  --    < > 103 103 105   < > 101 99 104  CO2  --    < >  --    < > 22 19* 21*   < > 28* 28* 20  GLUCOSE  --    < >  --    < > 91 89 98  --   --   --  95  BUN  --    < >  --    < > 29* 29* 27*   < > 22* 32* 38*  CREATININE  --    < >  --    < > 1.14* 1.32* 1.24*   < > 0.9 1.2* 1.20*  CALCIUM  --    < > 8.1*   < > 8.1* 8.4* 8.6*   < > 9.2 9.1 9.7  MG 1.7  --  1.1*  --  1.8 1.9  --   --   --   --   --   PHOS  --   --   --   --  4.4 3.5  --   --   --   --   --   TSH 1.914  --   --   --   --  4.325  --   --   --   --   --    < > = values in this interval not displayed.   Liver Function Tests: Recent Labs    04/24/22 0504 04/25/22 0735 04/26/22 0901 05/07/22 0000 06/01/22 0000  AST 24 21 18 26 30   ALT 16 15 12 16 25   ALKPHOS 52 53 54 59 77  BILITOT 0.7 1.1 0.9  --   --   PROT 5.8* 5.9* 5.9*  --   --   ALBUMIN 2.8* 2.8* 2.6* 3.0* 3.5   Recent Labs    10/15/21 1456 01/27/22 0830 04/22/22 0434  LIPASE 54* 53* 42   Recent Labs    04/26/22 0901  AMMONIA 18   CBC: Recent Labs    04/24/22 0504 04/25/22 0735 04/26/22 0901 05/07/22 0000 05/29/22 0000 06/01/22 0000  WBC 11.3* 8.9 5.7 6.7 4.8 6.3  NEUTROABS 10.0* 7.6  --   --   --  5.20  HGB 9.1* 9.5* 10.2* 10.0* 9.7* 10.2*  HCT 28.1* 29.3* 32.4* 29* 29*  31*  MCV 104.5* 103.5* 106.2*  --   --   --   PLT 237 274 283 411* 221 254   Lipid Panel: Recent Labs    01/28/22 1254  CHOL 208*  HDL 80  LDLCALC 116*  TRIG 59  CHOLHDL 2.6   Lab Results  Component Value Date   HGBA1C 4.9 04/22/2022    Procedures since last visit: ECHOCARDIOGRAM COMPLETE  Result Date: 08/23/2022    ECHOCARDIOGRAM REPORT   Patient Name:   ASTERIA DENSMORE Date of Exam: 08/23/2022 Medical Rec #:  [id number]        Height:       66.0 in Accession #:    [id number]       Weight:       139.0 lb Date of Birth:  1935-11-04        BSA:          1.713 m Patient Age:    30 years         BP:           158/56 mmHg Patient Gender: F                HR:           51 bpm. Exam Location:  [facility] Procedure: 2D Echo, Cardiac Doppler and Color Doppler Indications:    I31.3 Pericardial Effusion  History:        Patient has prior history of Echocardiogram examinations, most                 recent 02/13/2022. Pacemaker, Mitral Valve Prolapse and Mitral                 Valve Disease; Risk Factors:Hypertension, Dyslipidemia and                 Former Smoker. Chronic Pericardial Effusion without Tamponade,                 Pericarditis.  Sonographer:    Deliah Boston RDCS Referring Phys: [place]  1. Left ventricular ejection fraction, by estimation, is 60 to 65%. The left ventricle has normal function. The left ventricle has no regional wall motion abnormalities. There  is mild concentric left ventricular hypertrophy. Left ventricular diastolic function could not be evaluated. Elevated left ventricular end-diastolic pressure.  2. Right ventricular systolic function is normal. The right ventricular size is normal. There is severely elevated pulmonary artery systolic pressure.  3. Left atrial size was severely dilated.  4. Right atrial size was severely dilated.  5. Mostly posterior pericardial effusion measuring 1.77 cm. Compared with the echo 02/2022, the effusion is larger. No  evidence of tamponade. Moderate pericardial effusion. There is no evidence of cardiac tamponade. Moderate pleural effusion in the left lateral region.  6. The mitral valve is normal in structure. Trivial mitral valve regurgitation. No evidence of mitral stenosis.  7. The aortic valve is tricuspid. Aortic valve regurgitation is not visualized. No aortic stenosis is present.  8. The inferior vena cava is normal in size with <50% respiratory variability, suggesting right atrial pressure of 8 mmHg. FINDINGS  Left Ventricle: Left ventricular ejection fraction, by estimation, is 60 to 65%. The left ventricle has normal function. The left ventricle has no regional wall motion abnormalities. The left ventricular internal cavity size was normal in size. There is  mild concentric left ventricular hypertrophy. Left ventricular diastolic function could not be evaluated due to atrial fibrillation. Left ventricular diastolic function could not be evaluated. Elevated left ventricular end-diastolic pressure. Right Ventricle: The right ventricular size is normal. No increase in right ventricular wall thickness. Right ventricular systolic function is normal. There is severely elevated pulmonary artery systolic pressure. The tricuspid regurgitant velocity is 3.67 m/s, and with an assumed right atrial pressure of 8 mmHg, the estimated right ventricular systolic pressure is [id number] mmHg. Left Atrium: Left atrial size was severely dilated. Right Atrium: Right atrial size was severely dilated. Pericardium: Mostly posterior pericardial effusion measuring 1.77 cm. Compared with the echo 02/2022, the effusion is larger. No evidence of tamponade. A moderately sized pericardial effusion is present. There is no evidence of cardiac tamponade. Mitral Valve: The mitral valve is normal in structure. Trivial mitral valve regurgitation. No evidence of mitral valve stenosis. Tricuspid Valve: The tricuspid valve is normal in structure. Tricuspid valve  regurgitation is mild . No evidence of tricuspid stenosis. Aortic Valve: The aortic valve is tricuspid. Aortic valve regurgitation is not visualized. No aortic stenosis is present. Pulmonic Valve: The pulmonic valve was normal in structure. Pulmonic valve regurgitation is mild to moderate. No evidence of pulmonic stenosis. Aorta: The aortic root is normal in size and structure. Venous: The inferior vena cava is normal in size with less than 50% respiratory variability, suggesting right atrial pressure of 8 mmHg. IAS/Shunts: No atrial level shunt detected by color flow Doppler. Additional Comments: There is a moderate pleural effusion in the left lateral region.  LEFT VENTRICLE PLAX 2D LVIDd:         4.65 cm   Diastology LVIDs:         3.10 cm   LV e' medial:    6.42 cm/s LV PW:         1.15 cm   LV E/e' medial:  27.3 LV IVS:        1.05 cm   LV e' lateral:   8.92 cm/s LVOT diam:     2.00 cm   LV E/e' lateral: 19.7 LV SV:         75 LV SV Index:   44 LVOT Area:     3.14 cm  RIGHT VENTRICLE RV Basal diam:  3.50 cm TAPSE (M-mode): 1.6 cm RVSP:  56.9 mmHg LEFT ATRIUM             Index        RIGHT ATRIUM           Index LA diam:        4.55 cm 2.66 cm/m   RA Pressure: 3.00 mmHg LA Vol (A2C):   91.1 ml 53.17 ml/m  RA Area:     23.90 cm LA Vol (A4C):   89.0 ml 51.95 ml/m  RA Volume:   72.30 ml  42.20 ml/m LA Biplane Vol: 92.5 ml 53.99 ml/m  AORTIC VALVE LVOT Vmax:   101.00 cm/s LVOT Vmean:  63.700 cm/s LVOT VTI:    0.240 m  AORTA Ao Root diam: 3.00 cm Ao Asc diam:  2.70 cm MITRAL VALVE                TRICUSPID VALVE MV Area (PHT): cm          TR Peak grad:   53.9 mmHg MV Decel Time: 184 msec     TR Vmax:        367.00 cm/s MR Peak grad: 89.5 mmHg     Estimated RAP:  3.00 mmHg MR Mean grad: 56.0 mmHg     RVSP:           56.9 mmHg MR Vmax:      473.00 cm/s MR Vmean:     349.0 cm/s    SHUNTS MV E velocity: 175.50 cm/s  Systemic VTI:  0.24 m MV A velocity: 74.30 cm/s   Systemic Diam: 2.00 cm MV E/A ratio:   2.36 Skeet Latch MD Electronically signed by Skeet Latch MD Signature Date/Time: 08/23/2022/5:40:32 PM    Final    CUP PACEART REMOTE DEVICE CHECK  Result Date: 08/09/2022 Scheduled remote reviewed. Normal device function.  Known persistent AF Next remote 91 days.   Assessment/Plan 1. Pericardial effusion without cardiac tamponade Now on Colchicine No Tamponade Will Follow with Cardiology 2. Paroxysmal atrial fibrillation (HCC) Off amiodarone due to side effects No Anticoagulation due to AVMS Follows with EP  3. Primary hypertension BP above 170 in the morning Change Diovan to 160 mg BID  4. Heart block AV complete (HCC) S/p PPM  5. Cerebral AV malformation Follows with Neurology   6. History of complex partial epilepsy On Phenobarb and  Dilantin  7. Dyslipidemia   8. Diarrhea, unspecified type Not sure due to Colchicine  She has had few episodes recently If continues to get worse will check For C Diff    9. Chronic diastolic congestive heart failure (HCC) Lasix   10. Left leg swelling Doppler of Left leg Disease 11 S/P laparoscopic cholecystectomy  12 Anemia HGB has been stable Will Repeat CBC  Labs/tests ordered:  CBC, CMP,Magnesium level in 2 weeks Next appt:  10/08/2022

## 2022-09-05 DIAGNOSIS — I82452 Acute embolism and thrombosis of left peroneal vein: Secondary | ICD-10-CM | POA: Diagnosis not present

## 2022-09-06 ENCOUNTER — Emergency Department (HOSPITAL_COMMUNITY)
Admission: EM | Admit: 2022-09-06 | Discharge: 2022-09-06 | Disposition: A | Discharge status: Home / Self Care | Payer: Medicare Other | Attending: Emergency Medicine | Admitting: Emergency Medicine

## 2022-09-06 ENCOUNTER — Other Ambulatory Visit: Payer: Self-pay

## 2022-09-06 ENCOUNTER — Non-Acute Institutional Stay: Payer: Medicare Other | Admitting: Adult Health

## 2022-09-06 ENCOUNTER — Emergency Department (HOSPITAL_BASED_OUTPATIENT_CLINIC_OR_DEPARTMENT_OTHER): Admit: 2022-09-06 | Discharge: 2022-09-06 | Disposition: A | Discharge status: Home / Self Care | Payer: Medicare Other

## 2022-09-06 ENCOUNTER — Encounter (HOSPITAL_COMMUNITY): Payer: Self-pay

## 2022-09-06 ENCOUNTER — Encounter: Payer: Self-pay | Admitting: Adult Health

## 2022-09-06 DIAGNOSIS — I10 Essential (primary) hypertension: Secondary | ICD-10-CM | POA: Diagnosis not present

## 2022-09-06 DIAGNOSIS — R600 Localized edema: Secondary | ICD-10-CM | POA: Diagnosis not present

## 2022-09-06 DIAGNOSIS — R609 Edema, unspecified: Secondary | ICD-10-CM | POA: Diagnosis not present

## 2022-09-06 DIAGNOSIS — I82452 Acute embolism and thrombosis of left peroneal vein: Secondary | ICD-10-CM

## 2022-09-06 DIAGNOSIS — M7122 Synovial cyst of popliteal space [Baker], left knee: Secondary | ICD-10-CM | POA: Diagnosis not present

## 2022-09-06 DIAGNOSIS — I959 Hypotension, unspecified: Secondary | ICD-10-CM | POA: Diagnosis not present

## 2022-09-06 DIAGNOSIS — M7989 Other specified soft tissue disorders: Secondary | ICD-10-CM

## 2022-09-06 MED ORDER — VALSARTAN 160 MG PO TABS: 160.0000 mg | ORAL_TABLET | Freq: Two times a day (BID) | ORAL | 0 refills | Status: AC

## 2022-09-06 MED ORDER — DICLOFENAC SODIUM 1 % EX GEL: 4.0000 g | Freq: Four times a day (QID) | CUTANEOUS | 0 refills | Status: DC

## 2022-09-06 NOTE — ED Provider Triage Note (Signed)
Emergency Medicine Provider Triage Evaluation Note  [name] , a 87 y.o. female  was evaluated in triage.  Pt complains of left leg swelling, recently diagnosed with a dvt.  Review of Systems  Positive: Leg swelling Negative:  Chest pain, sob, near syncope, hemoptysis  Physical Exam  BP (!) 175/63 (BP Location: Left Arm)   Pulse 77   Temp 98.4 F (36.9 C) (Oral)   Ht 5\' 6"  (1.676 m)   Wt 58.5 kg   SpO2 99%   BMI 20.82 kg/m  Gen:   Awake, no distress   Resp:  Normal effort  MSK:   Moves extremities without difficulty, LLE with edema Other:  NAD  Medical Decision Making  Medically screening exam initiated at 4:32 PM.  Appropriate orders placed.  [name] was informed that the remainder of the evaluation will be completed by another provider, this initial triage assessment does not replace that evaluation, and the importance of remaining in the ED until their evaluation is complete.  87 yo F with a chief complaints of DVT to the left lower extremity.  She tells me she has been having swelling to that leg since she had a surgery to have her gallbladder removed back in November.  She had just seen her doctor a few days ago and they had ordered a DVT study that showed a DVT.  Unfortunately she has a known AVM and is not typically a candidate for anticoagulation.  She does have A-fib but is not anticoagulated.  She denies any other specific symptoms.  I will obtain a DVT study here as of not able to see the images from the outpatient study.  Then perhaps a discussion with vascular for possible thrombectomy versus IVC filter placement.   Kelsey Etienne, DO 09/06/22 [phone]

## 2022-09-06 NOTE — ED Provider Notes (Signed)
[facility] Provider Note   CSN: [id number] Arrival date & time: 09/06/22  1612     History  No chief complaint on file.   [name] is a 87 y.o. female.  87 yo F with a  cc of left leg swelling.  Going on for a couple months.  Patient was seen by her provider and a study was done at the facility.  There was concern for DVT and she was sent here for evaluation.  She denies any chest pain difficulty breathing hemoptysis feel like she might pass out.  Tells me that she is fine.  Has had no pain.  Started after she had her gallbladder removed.        Home Medications Prior to Admission medications   Medication Sig Start Date End Date Taking? Authorizing Provider  acetaminophen (TYLENOL) 500 MG tablet Take 500 mg by mouth every 6 (six) hours as needed for mild pain or headache.    [provider]  b complex vitamins capsule Take 1 capsule by mouth daily.    [provider]  diclofenac Sodium (VOLTAREN) 1 % GEL Apply 4 g topically 4 (four) times daily. 09/06/22  Yes Deno Etienne, DO  fluticasone (FLONASE) 50 MCG/ACT nasal spray Place into both nostrils daily.    [provider]  furosemide (LASIX) 20 MG tablet Take 20 mg by mouth daily. 06/17/22   [provider]  hydrALAZINE (APRESOLINE) 50 MG tablet Take 50 mg by mouth 4 (four) times daily.    [provider]  hydrocortisone cream 1 % Apply 1 Application topically 3 (three) times daily as needed for itching. Patient not taking: Reported on 09/04/2022 05/09/22   Yvonna Alanis, NP  ipratropium-albuterol (DUONEB) 0.5-2.5 (3) MG/3ML SOLN Take 3 mLs by nebulization every 6 (six) hours as needed.    [provider]  loperamide (IMODIUM A-D) 2 MG tablet Take 2 mg by mouth every 4 (four) hours as needed for diarrhea or loose stools.    [provider]  magnesium oxide (MAG-OX) 400 (240 Mg) MG tablet Take 400 mg by mouth in the morning,  at noon, and at bedtime.    [provider]  Multiple Vitamins-Iron (MULTI-VITAMIN/IRON PO) Take by mouth.    [provider]  Nutritional Supplements (NUTRITIONAL DRINK PO) Take 1 Bottle by mouth daily. Chocolate Boost or Ensure with lunch    [provider]  ondansetron (ZOFRAN-ODT) 4 MG disintegrating tablet Take 4 mg by mouth every 4 (four) hours as needed for nausea or vomiting. 02/20/22   [provider]  PHENobarbital (LUMINAL) 64.8 MG tablet Take 64.8 mg by mouth daily. Take 1 tablet every morning    [provider]  phenytoin (DILANTIN) 100 MG ER capsule Take 200 mg by mouth daily.    [provider]  Polyethyl Glycol-Propyl Glycol (SYSTANE) 0.4-0.3 % SOLN Apply to eye.    [provider]  potassium chloride (KLOR-CON M) 10 MEQ tablet Take 20 mEq by mouth daily. 06/01/22   [provider]  Probiotic Product (ALIGN) 4 MG CAPS Take 4 mg by mouth daily.    [provider]  valsartan (DIOVAN) 160 MG tablet Take 1 tablet (160 mg total) by mouth 2 (two) times daily. 09/06/22   Royal Hawthorn, NP  colchicine 0.6 MG tablet Take 1 tablet (0.6 mg total) by mouth daily. 08/29/22 11/27/22  [place], [name], MD      Allergies  Aleve [naproxen], Azithromycin, Penicillins, Pravastatin, Sulfa antibiotics, Sulfa drugs cross reactors, Tape, and Naproxen sodium    Review of Systems   Review of Systems  Physical Exam Updated Vital Signs BP (!) 175/63 (BP Location: Left Arm)   Pulse 77   Temp 98.4 F (36.9 C) (Oral)   Ht 5\' 6"  (1.676 m)   Wt 58.5 kg   SpO2 99%   BMI 20.82 kg/m  Physical Exam Vitals and nursing note reviewed.  Constitutional:      General: She is not in acute distress.    Appearance: She is well-developed. She is not diaphoretic.  HENT:     Head: Normocephalic and atraumatic.  Eyes:     Pupils: Pupils are equal, round, and reactive to light.  Cardiovascular:     Rate and Rhythm: Normal rate and  regular rhythm.     Heart sounds: No murmur heard.    No friction rub. No gallop.  Pulmonary:     Effort: Pulmonary effort is normal.     Breath sounds: No wheezing or rales.  Abdominal:     General: There is no distension.     Palpations: Abdomen is soft.     Tenderness: There is no abdominal tenderness.  Musculoskeletal:        General: No tenderness.     Cervical back: Normal range of motion and neck supple.     Left lower leg: Edema present.     Comments: Edema to the left lower extremity especially behind the knee.  Pulse motor and sensation intact distally.  No erythema or warmth.  Skin:    General: Skin is warm and dry.  Neurological:     Mental Status: She is alert and oriented to person, place, and time.  Psychiatric:        Behavior: Behavior normal.     ED Results / Procedures / Treatments   Labs (all labs ordered are listed, but only abnormal results are displayed) Labs Reviewed - No data to display  EKG None  Radiology VAS [place] LOWER EXTREMITY VENOUS (DVT)  Result Date: 09/06/2022  Lower Venous DVT Study Patient Name:  [name]  Date of Exam:   09/06/2022 Medical Rec #: [id number]         Accession #:    [id number] Date of Birth: 05/26/1936         Patient Gender: F Patient Age:   37 years Exam Location:  [facility] Procedure:      VAS [place] LOWER EXTREMITY VENOUS (DVT) Referring Phys: Tauno Falotico --------------------------------------------------------------------------------  Indications: Swelling.  Risk Factors: Surgery. Limitations: Poor ultrasound/tissue interface. Comparison Study: 09/06/22: Acute occlusive deep vein thrombosis in the left                   peroneal vein                   Left popliteal Baker's cyst 8.3 x 4.4 x 3.1 cm Performing Technologist: Oliver Hum RVT  Examination Guidelines: A complete evaluation includes B-mode imaging, spectral Doppler, color Doppler, and power Doppler as needed of all accessible portions of each vessel.  Bilateral testing is considered an integral part of a complete examination. Limited examinations for reoccurring indications may be performed as noted. The reflux portion of the exam is performed with the patient in reverse Trendelenburg.  +-----+---------------+---------+-----------+----------+--------------+ RIGHTCompressibilityPhasicitySpontaneityPropertiesThrombus Aging +-----+---------------+---------+-----------+----------+--------------+ CFV  Full           Yes  Yes                                 +-----+---------------+---------+-----------+----------+--------------+   +---------+---------------+---------+-----------+----------+--------------+ LEFT     CompressibilityPhasicitySpontaneityPropertiesThrombus Aging +---------+---------------+---------+-----------+----------+--------------+ CFV      Full           Yes      Yes                                 +---------+---------------+---------+-----------+----------+--------------+ SFJ      Full                                                        +---------+---------------+---------+-----------+----------+--------------+ FV Prox  Full                                                        +---------+---------------+---------+-----------+----------+--------------+ FV Mid   Full                                                        +---------+---------------+---------+-----------+----------+--------------+ FV DistalFull                                                        +---------+---------------+---------+-----------+----------+--------------+ PFV      Full                                                        +---------+---------------+---------+-----------+----------+--------------+ POP      Full           Yes      Yes                                 +---------+---------------+---------+-----------+----------+--------------+ PTV      Full                                                         +---------+---------------+---------+-----------+----------+--------------+ PERO     Full                                                        +---------+---------------+---------+-----------+----------+--------------+    Summary: RIGHT: - No evidence of common  femoral vein obstruction.  LEFT: - There is no evidence of deep vein thrombosis in the lower extremity. However, portions of this examination were limited- see technologist comments above.  - A cystic structure is found in the popliteal fossa.  *See table(s) above for measurements and observations.    Preliminary     Procedures Procedures    Medications Ordered in ED Medications - No data to display  ED Course/ Medical Decision Making/ A&P                             Medical Decision Making  47 yoF with a chief complaints of left leg swelling.  This been going on for about 4 months now.  She had seen her provider at the facility in which she lives and they told her that she had a DVT and then because she has a known brain AVM was told to come here to try and figure out what to do for treatment.  Luckily the DVT study here is negative.  The patient does have a Baker's cyst.  Will treat supportively.  Given orthopedic follow-up if needed.  5:37 PM:  I have discussed the diagnosis/risks/treatment options with the patient.  Evaluation and diagnostic testing in the emergency department does not suggest an emergent condition requiring admission or immediate intervention beyond what has been performed at this time.  They will follow up with PCP. We also discussed returning to the ED immediately if new or worsening sx occur. We discussed the sx which are most concerning (e.g., sudden worsening pain, fever, inability to tolerate by mouth) that necessitate immediate return. Medications administered to the patient during their visit and any new prescriptions provided to the patient are listed below.  Medications given  during this visit Medications - No data to display   The patient appears reasonably screen and/or stabilized for discharge and I doubt any other medical condition or other [facility] requiring further screening, evaluation, or treatment in the ED at this time prior to discharge.          Final Clinical Impression(s) / ED Diagnoses Final diagnoses:  Baker's cyst of knee, left    Rx / DC Orders ED Discharge Orders          Ordered    diclofenac Sodium (VOLTAREN) 1 % GEL  4 times daily        09/06/22 1729    Apply wrap        09/06/22 [place], Anurag Scarfo, DO 09/06/22 1737

## 2022-09-06 NOTE — ED Triage Notes (Addendum)
PT arrived via [facility] from [facility], Pt noticed swelling in her (L) knee cap about 2 days ago and the PA at the nursing home used the doppler on her yesterday which resulted in a cyst and DVT in the back of her (L) leg today and she was sent to [facility] for further evaluation.PT has 3+ swelling of (L) knee, 2+pedal pulse ,cap refill of less than 2 secs, warm to touch and able to move leg

## 2022-09-06 NOTE — Progress Notes (Signed)
Left lower extremity venous duplex has been completed. Preliminary results can be found in CV Proc through chart review.  Results were given to Dr. Tyrone Nine.  09/06/22 5:20 PM Carlos Levering RVT

## 2022-09-06 NOTE — Discharge Instructions (Addendum)
Compression rest and elevation to help with this.  I prescribed you a gel you can rub right on the area to help you as well.  I have given you information for an orthopedic surgeon if this is still bothering you they may consider draining it in the office.

## 2022-09-06 NOTE — Progress Notes (Signed)
Location:  Occupational psychologist of Service:  ALF (13) Provider:   Cindi Carbon, [place] [phone] Kelsey Dad, MD  Patient Care Team: Kelsey Dad, MD as PCP - General (Internal Medicine) [place], [name], MD as PCP - Cardiology (Cardiology) Kelsey Sprang, MD as Consulting Physician (Neurology)  Extended Emergency Contact Information Primary Emergency Contact: [name] Mobile Phone: [phone] Relation: Daughter Secondary Emergency Contact: [name] Mobile Phone: [phone] Relation: Son   Goals of care: Advanced Directive information    09/04/2022   10:50 AM  Advanced Directives  Does Patient Have a Medical Advance Directive? Yes  Type of Paramedic of [place];Living will;Out of facility DNR (pink MOST or yellow form)  Does patient want to make changes to medical advance directive? No - Patient declined  Copy of [place] in Chart? Yes - validated most recent copy scanned in chart (See row information)  Pre-existing out of facility DNR order (yellow form or pink MOST form) Yellow form placed in chart (order not valid for inpatient use)     Chief Complaint  Patient presents with   Acute Visit    Discuss doppler study     HPI:  Pt is a 87 y.o. female seen today for an acute visit to discuss a doppler study  [name] was having left leg swelling and so a venous doppler was ordered which showed the following  09/06/22: Acute occlusive deep vein thrombosis in the left peroneal vein Left popliteal Baker's cyst 8.3 x 4.4 x 3.1 cm  She is not having any calf pain. No redness. Does have worsening swelling on the left leg.  Denies any cp or sob. Does have chronic DOE and fatigue.   She has afib and is not on DOAC due to brain AVM. ALso has a pacemaker, pericarditis on colchicine, HTN, on seizure prevention meds, intermittent diarrhea   Past Medical History:   Diagnosis Date   Abnormal cardiovascular stress test    Carotid arterial disease (HCC)    MODERATE RIGHT   Cerebral AV malformation    DDD (degenerative disc disease)    SEVERE WITH SPINAL STENOSIS   Hypercholesterolemia    Hypertension    MVP (mitral valve prolapse)    MILD   Numbness    LEFT FOOT AND ANKLE   Osteopenia    Presence of permanent cardiac pacemaker    Pseudo-gout    Past Surgical History:  Procedure Laterality Date   APPENDECTOMY     BREAST MASS EXCISION     CARPAL TUNNEL RELEASE     CHOLECYSTECTOMY N/A 04/23/2022   Procedure: LAPAROSCOPIC CHOLECYSTECTOMY;  Surgeon: Coralie Keens, MD;  Location: [facility];  Service: General;  Laterality: N/A;   gallbladder     PACEMAKER IMPLANT N/A 11/07/2021   Procedure: PACEMAKER IMPLANT;  Surgeon: Evans Lance, MD;  Location: [facility];  Service: Cardiovascular;  Laterality: N/A;   REMOVAL OF PARATHYROID GLAND     STRESS NUCLEAR STUDY     ABNORMAL   TONSILLECTOMY      Allergies  Allergen Reactions   Aleve [Naproxen] Other (See Comments)   Azithromycin Other (See Comments)    Reaction not recalled   Penicillins Hives and Other (See Comments)    Bad reaction as a child after a shot of Penicillin   Pravastatin Other (See Comments)    Reaction not recalled   Sulfa Antibiotics Other (See Comments)  Sulfa Drugs Cross Reactors Other (See Comments)    Reaction not recalled- stated she can take it "in small doses"   Tape Itching and Other (See Comments)    Cannot wear for an extended period of time   Naproxen Sodium Rash    Outpatient Encounter Medications as of 09/06/2022  Medication Sig   acetaminophen (TYLENOL) 500 MG tablet Take 500 mg by mouth every 6 (six) hours as needed for mild pain or headache.   b complex vitamins capsule Take 1 capsule by mouth daily.   fluticasone (FLONASE) 50 MCG/ACT nasal spray Place into both nostrils daily.   furosemide (LASIX) 20 MG tablet Take 20 mg by mouth daily.    hydrALAZINE (APRESOLINE) 50 MG tablet Take 50 mg by mouth 4 (four) times daily.   hydrocortisone cream 1 % Apply 1 Application topically 3 (three) times daily as needed for itching. (Patient not taking: Reported on 09/04/2022)   ipratropium-albuterol (DUONEB) 0.5-2.5 (3) MG/3ML SOLN Take 3 mLs by nebulization every 6 (six) hours as needed.   loperamide (IMODIUM A-D) 2 MG tablet Take 2 mg by mouth every 4 (four) hours as needed for diarrhea or loose stools.   magnesium oxide (MAG-OX) 400 (240 Mg) MG tablet Take 400 mg by mouth daily.   Multiple Vitamins-Iron (MULTI-VITAMIN/IRON PO) Take by mouth.   Nutritional Supplements (NUTRITIONAL DRINK PO) Take 1 Bottle by mouth daily. Chocolate Boost or Ensure with lunch   ondansetron (ZOFRAN-ODT) 4 MG disintegrating tablet Take 4 mg by mouth every 4 (four) hours as needed for nausea or vomiting.   PHENobarbital (LUMINAL) 64.8 MG tablet Take 64.8 mg by mouth daily. Take 1 tablet every morning   phenytoin (DILANTIN) 100 MG ER capsule Take 200 mg by mouth daily.   Polyethyl Glycol-Propyl Glycol (SYSTANE) 0.4-0.3 % SOLN Apply to eye.   potassium chloride (KLOR-CON Little) 10 MEQ tablet Take 20 mEq by mouth daily.   Probiotic Product (ALIGN) 4 MG CAPS Take 4 mg by mouth daily.   valsartan (DIOVAN) 160 MG tablet Take 1 tablet (160 mg total) by mouth daily. (Patient taking differently: Take 160 mg by mouth 2 (two) times daily.)   [DISCONTINUED] colchicine 0.6 MG tablet Take 1 tablet (0.6 mg total) by mouth daily.   No facility-administered encounter medications on file as of 09/06/2022.    Review of Systems  Constitutional:  Positive for fatigue. Negative for activity change, appetite change, chills, diaphoresis, fever and unexpected weight change.  HENT:  Negative for congestion.   Respiratory:  Positive for shortness of breath (on exertion). Negative for cough and wheezing.   Cardiovascular:  Positive for leg swelling. Negative for chest pain and palpitations.   Gastrointestinal:  Negative for abdominal distention, abdominal pain, constipation and diarrhea.  Genitourinary:  Negative for difficulty urinating and dysuria.  Musculoskeletal:  Positive for arthralgias and gait problem. Negative for back pain, joint swelling and myalgias.  Neurological:  Negative for dizziness, tremors, seizures, syncope, facial asymmetry, speech difficulty, weakness, light-headedness, numbness and headaches.  Psychiatric/Behavioral:  Negative for agitation, behavioral problems and confusion.     Immunization History  Administered Date(s) Administered   Fluad Quad(high Dose 65+) 04/16/2022   Influenza Split 03/28/2009, 03/17/2010, 03/26/2011, 04/01/2012, 03/05/2013   Influenza, Quadrivalent, Recombinant, Inj, Pf 03/08/2018, 02/13/2019, 02/24/2020   Influenza,inj,Quad PF,6+ Mos 02/25/2014, 03/05/2015   Influenza-Unspecified 02/09/2014, 02/25/2016, 04/06/2017, 04/25/2021   Moderna Sars-Covid-2 Vaccination 07/17/2019, 08/14/2019, 02/24/2020, 04/26/2020   Pneumococcal Conjugate-13 11/26/2014   Pneumococcal-Unspecified 06/23/2003, 11/10/2012   Td (Adult), 2 Lf  Tetanus Toxid, Preservative Free 06/11/2000, 11/06/2011   Unspecified SARS-COV-2 Vaccination 04/28/2021   Zoster Recombinat (Shingrix) 01/09/2019, 05/08/2019   Zoster, Live 01/15/2006, 01/15/2019   Pertinent  Health Maintenance Due  Topic Date Due   DEXA SCAN  Never done   INFLUENZA VACCINE  Completed      04/29/2022    8:10 PM 05/01/2022    9:41 AM 06/26/2022    1:39 PM 07/19/2022   10:35 AM 07/30/2022    1:44 PM  [place] in the past year?  0 0 1 1  Was there an injury with Fall?  0 1 0 0  Fall Risk Category Calculator  0 1 2 1   Fall Risk Category (Retired)  Low     (RETIRED) Patient Fall Risk Level High fall risk High fall risk     Patient at Risk for Falls Due to  Impaired balance/gait;Impaired mobility History of fall(s);Impaired balance/gait  History of fall(s)  Fall risk Follow up  Falls  evaluation completed Falls evaluation completed Falls evaluation completed Falls evaluation completed   Functional Status Survey:    Vitals:   09/06/22 1403  BP: (!) 176/71  Pulse: 60   There is no height or weight on file to calculate BMI. Physical Exam Vitals and nursing note reviewed.  Cardiovascular:     Rate and Rhythm: Normal rate. Rhythm irregular.  Pulmonary:     Effort: Pulmonary effort is normal.     Breath sounds: Wheezing present.  Musculoskeletal:     Right lower leg: Edema (+1) present.     Left lower leg: Edema (+3) present.     Comments: Left posterior knee with increased swelling, pitting. No tenderness, no warmth, no redness.   Neurological:     General: No focal deficit present.     Mental Status: Mental status is at baseline.     Labs reviewed: Recent Labs    04/22/22 1826 04/23/22 0811 04/24/22 0504 04/25/22 0735 04/26/22 0901 04/29/22 0436 05/29/22 0000 06/01/22 0000 08/28/22 1131  NA  --    < > 136 135 136   < > 138 136* 141  K  --    < > 3.7 3.7 3.8   < > 4.0 4.2 5.6*  CL  --    < > 103 103 105   < > 101 99 104  CO2  --    < > 22 19* 21*   < > 28* 28* 20  GLUCOSE  --    < > 91 89 98  --   --   --  95  BUN  --    < > 29* 29* 27*   < > 22* 32* 38*  CREATININE  --    < > 1.14* 1.32* 1.24*   < > 0.9 1.2* 1.20*  CALCIUM 8.1*   < > 8.1* 8.4* 8.6*   < > 9.2 9.1 9.7  MG 1.1*  --  1.8 1.9  --   --   --   --   --   PHOS  --   --  4.4 3.5  --   --   --   --   --    < > = values in this interval not displayed.   Recent Labs    04/24/22 0504 04/25/22 0735 04/26/22 0901 05/07/22 0000 06/01/22 0000  AST 24 21 18 26 30   ALT 16 15 12 16 25   ALKPHOS 52 53 54 59 77  BILITOT 0.7  1.1 0.9  --   --   PROT 5.8* 5.9* 5.9*  --   --   ALBUMIN 2.8* 2.8* 2.6* 3.0* 3.5   Recent Labs    04/24/22 0504 04/25/22 0735 04/26/22 0901 05/07/22 0000 05/29/22 0000 06/01/22 0000  WBC 11.3* 8.9 5.7 6.7 4.8 6.3  NEUTROABS 10.0* 7.6  --   --   --  5.20  HGB  9.1* 9.5* 10.2* 10.0* 9.7* 10.2*  HCT 28.1* 29.3* 32.4* 29* 29* 31*  MCV 104.5* 103.5* 106.2*  --   --   --   PLT 237 274 283 411* 221 254   Lab Results  Component Value Date   TSH 4.325 04/25/2022   Lab Results  Component Value Date   HGBA1C 4.9 04/22/2022   Lab Results  Component Value Date   CHOL 208 (H) 01/28/2022   HDL 80 01/28/2022   LDLCALC 116 (H) 01/28/2022   TRIG 59 01/28/2022   CHOLHDL 2.6 01/28/2022    Significant Diagnostic Results in last 30 days:  ECHOCARDIOGRAM COMPLETE  Result Date: 08/23/2022    ECHOCARDIOGRAM REPORT   Patient Name:   [name] Date of Exam: 08/23/2022 Medical Rec #:  [id number]        Height:       66.0 in Accession #:    [id number]       Weight:       139.0 lb Date of Birth:  April 09, 1936        BSA:          1.713 Little Patient Age:    27 years         BP:           158/56 mmHg Patient Gender: F                HR:           51 bpm. Exam Location:  [facility] Procedure: 2D Echo, Cardiac Doppler and Color Doppler Indications:    I31.3 Pericardial Effusion  History:        Patient has prior history of Echocardiogram examinations, most                 recent 02/13/2022. Pacemaker, Mitral Valve Prolapse and Mitral                 Valve Disease; Risk Factors:Hypertension, Dyslipidemia and                 Former Smoker. Chronic Pericardial Effusion without Tamponade,                 Pericarditis.  Sonographer:    Deliah Boston RDCS Referring Phys: [place]  1. Left ventricular ejection fraction, by estimation, is 60 to 65%. The left ventricle has normal function. The left ventricle has no regional wall motion abnormalities. There is mild concentric left ventricular hypertrophy. Left ventricular diastolic function could not be evaluated. Elevated left ventricular end-diastolic pressure.  2. Right ventricular systolic function is normal. The right ventricular size is normal. There is severely elevated pulmonary artery systolic pressure.  3.  Left atrial size was severely dilated.  4. Right atrial size was severely dilated.  5. Mostly posterior pericardial effusion measuring 1.77 cm. Compared with the echo 02/2022, the effusion is larger. No evidence of tamponade. Moderate pericardial effusion. There is no evidence of cardiac tamponade. Moderate pleural effusion in the left lateral region.  6. The mitral valve is normal in structure. Trivial  mitral valve regurgitation. No evidence of mitral stenosis.  7. The aortic valve is tricuspid. Aortic valve regurgitation is not visualized. No aortic stenosis is present.  8. The inferior vena cava is normal in size with <50% respiratory variability, suggesting right atrial pressure of 8 mmHg. FINDINGS  Left Ventricle: Left ventricular ejection fraction, by estimation, is 60 to 65%. The left ventricle has normal function. The left ventricle has no regional wall motion abnormalities. The left ventricular internal cavity size was normal in size. There is  mild concentric left ventricular hypertrophy. Left ventricular diastolic function could not be evaluated due to atrial fibrillation. Left ventricular diastolic function could not be evaluated. Elevated left ventricular end-diastolic pressure. Right Ventricle: The right ventricular size is normal. No increase in right ventricular wall thickness. Right ventricular systolic function is normal. There is severely elevated pulmonary artery systolic pressure. The tricuspid regurgitant velocity is 3.67 Little/s, and with an assumed right atrial pressure of 8 mmHg, the estimated right ventricular systolic pressure is [id number] mmHg. Left Atrium: Left atrial size was severely dilated. Right Atrium: Right atrial size was severely dilated. Pericardium: Mostly posterior pericardial effusion measuring 1.77 cm. Compared with the echo 02/2022, the effusion is larger. No evidence of tamponade. A moderately sized pericardial effusion is present. There is no evidence of cardiac tamponade. Mitral  Valve: The mitral valve is normal in structure. Trivial mitral valve regurgitation. No evidence of mitral valve stenosis. Tricuspid Valve: The tricuspid valve is normal in structure. Tricuspid valve regurgitation is mild . No evidence of tricuspid stenosis. Aortic Valve: The aortic valve is tricuspid. Aortic valve regurgitation is not visualized. No aortic stenosis is present. Pulmonic Valve: The pulmonic valve was normal in structure. Pulmonic valve regurgitation is mild to moderate. No evidence of pulmonic stenosis. Aorta: The aortic root is normal in size and structure. Venous: The inferior vena cava is normal in size with less than 50% respiratory variability, suggesting right atrial pressure of 8 mmHg. IAS/Shunts: No atrial level shunt detected by color flow Doppler. Additional Comments: There is a moderate pleural effusion in the left lateral region.  LEFT VENTRICLE PLAX 2D LVIDd:         4.65 cm   Diastology LVIDs:         3.10 cm   LV e' medial:    6.42 cm/s LV PW:         1.15 cm   LV E/e' medial:  27.3 LV IVS:        1.05 cm   LV e' lateral:   8.92 cm/s LVOT diam:     2.00 cm   LV E/e' lateral: 19.7 LV SV:         75 LV SV Index:   44 LVOT Area:     3.14 cm  RIGHT VENTRICLE RV Basal diam:  3.50 cm TAPSE (Little-mode): 1.6 cm RVSP:           56.9 mmHg LEFT ATRIUM             Index        RIGHT ATRIUM           Index LA diam:        4.55 cm 2.66 cm/Little   RA Pressure: 3.00 mmHg LA Vol (A2C):   91.1 ml 53.17 ml/Little  RA Area:     23.90 cm LA Vol (A4C):   89.0 ml 51.95 ml/Little  RA Volume:   72.30 ml  42.20 ml/Little LA Biplane Vol: 92.5 ml 53.99  ml/Little  AORTIC VALVE LVOT Vmax:   101.00 cm/s LVOT Vmean:  63.700 cm/s LVOT VTI:    0.240 Little  AORTA Ao Root diam: 3.00 cm Ao Asc diam:  2.70 cm MITRAL VALVE                TRICUSPID VALVE MV Area (PHT): cm          TR Peak grad:   53.9 mmHg MV Decel Time: 184 msec     TR Vmax:        367.00 cm/s MR Peak grad: 89.5 mmHg     Estimated RAP:  3.00 mmHg MR Mean grad: 56.0 mmHg     RVSP:            56.9 mmHg MR Vmax:      473.00 cm/s MR Vmean:     349.0 cm/s    SHUNTS MV E velocity: 175.50 cm/s  Systemic VTI:  0.24 Little MV A velocity: 74.30 cm/s   Systemic Diam: 2.00 cm MV E/A ratio:  2.36 Skeet Latch MD Electronically signed by Skeet Latch MD Signature Date/Time: 08/23/2022/5:40:32 PM    Final    CUP PACEART REMOTE DEVICE CHECK  Result Date: 08/09/2022 Scheduled remote reviewed. Normal device function.  Known persistent AF Next remote 91 days.   Assessment/Plan 1. Acute deep vein thrombosis (DVT) of left peroneal vein (HCC) Has contraindication to anticoagulation due to large frontal lobe AVM/aneurysm Recommend ER evaluation for repeat study ?IVC filter.   2. Synovial cyst of left popliteal space Noted on [place] to posterior left knee. NO current pain or inflammation.   Discussed risk that the DVT could lead to a PE with risk of death. Discussed that she should not walk on her leg until treatment has been initiated.   Family/ staff Communication: discussed with her daughter Kathaleen Bury ordered:  NA

## 2022-09-10 NOTE — Progress Notes (Signed)
Remote pacemaker transmission.   

## 2022-09-13 DIAGNOSIS — D044 Carcinoma in situ of skin of scalp and neck: Secondary | ICD-10-CM | POA: Diagnosis not present

## 2022-09-13 DIAGNOSIS — Z85828 Personal history of other malignant neoplasm of skin: Secondary | ICD-10-CM | POA: Diagnosis not present

## 2022-09-14 ENCOUNTER — Ambulatory Visit: Payer: Medicare Other | Admitting: Cardiology

## 2022-09-18 DIAGNOSIS — E612 Magnesium deficiency: Secondary | ICD-10-CM | POA: Diagnosis not present

## 2022-09-18 LAB — COMPREHENSIVE METABOLIC PANEL
Albumin: 3.9 (ref 3.5–5.0)
Calcium: 9.6 (ref 8.7–10.7)
Globulin: 2.2
eGFR: 42

## 2022-09-18 LAB — HEPATIC FUNCTION PANEL
ALT: 27 U/L (ref 7–35)
AST: 31 (ref 13–35)
Alkaline Phosphatase: 3.9 — AB (ref 25–125)
Bilirubin, Total: 0.3

## 2022-09-18 LAB — BASIC METABOLIC PANEL
BUN: 39 — AB (ref 4–21)
CO2: 19 (ref 13–22)
Chloride: 104 (ref 99–108)
Creatinine: 1.3 — AB (ref 0.5–1.1)
Glucose: 106
Potassium: 5 mEq/L (ref 3.5–5.1)
Sodium: 136 — AB (ref 137–147)

## 2022-09-18 LAB — CBC: RBC: 2.87 — AB (ref 3.87–5.11)

## 2022-09-18 LAB — CBC AND DIFFERENTIAL
HCT: 29 — AB (ref 36–46)
Hemoglobin: 10 — AB (ref 12.0–16.0)
Platelets: 243 10*3/uL (ref 150–400)
WBC: 5.2

## 2022-10-08 ENCOUNTER — Encounter: Payer: Self-pay | Admitting: Adult Health

## 2022-10-08 ENCOUNTER — Non-Acute Institutional Stay: Payer: Medicare Other | Admitting: Adult Health

## 2022-10-08 VITALS — BP 122/61 | HR 61 | Temp 97.9°F | Resp 17 | Ht 66.0 in | Wt 114.0 lb

## 2022-10-08 DIAGNOSIS — U099 Post covid-19 condition, unspecified: Secondary | ICD-10-CM | POA: Diagnosis not present

## 2022-10-08 DIAGNOSIS — G9332 Myalgic encephalomyelitis/chronic fatigue syndrome: Secondary | ICD-10-CM

## 2022-10-08 DIAGNOSIS — I10 Essential (primary) hypertension: Secondary | ICD-10-CM | POA: Diagnosis not present

## 2022-10-08 DIAGNOSIS — Q282 Arteriovenous malformation of cerebral vessels: Secondary | ICD-10-CM | POA: Diagnosis not present

## 2022-10-08 DIAGNOSIS — I442 Atrioventricular block, complete: Secondary | ICD-10-CM | POA: Diagnosis not present

## 2022-10-08 DIAGNOSIS — R634 Abnormal weight loss: Secondary | ICD-10-CM

## 2022-10-08 DIAGNOSIS — I309 Acute pericarditis, unspecified: Secondary | ICD-10-CM | POA: Diagnosis not present

## 2022-10-08 DIAGNOSIS — I480 Paroxysmal atrial fibrillation: Secondary | ICD-10-CM

## 2022-10-08 DIAGNOSIS — G40209 Localization-related (focal) (partial) symptomatic epilepsy and epileptic syndromes with complex partial seizures, not intractable, without status epilepticus: Secondary | ICD-10-CM

## 2022-10-08 NOTE — Progress Notes (Signed)
Location:  [facility]  POS: Clinic  Provider: Fletcher Anon, ANP  Code Status: DNR Goals of Care:     10/08/2022    2:39 PM  Advanced Directives  Does Patient Have a Medical Advance Directive? Yes  Type of Estate agent of [place];Living will;Out of facility DNR (pink MOST or yellow form)  Does patient want to make changes to medical advance directive? No - Patient declined  Copy of Healthcare Power of Attorney in Chart? Yes - validated most recent copy scanned in chart (See row information)  Pre-existing out of facility DNR order (yellow form or pink MOST form) Yellow form placed in chart (order not valid for inpatient use)     Chief Complaint  Patient presents with   Medical Management of Chronic Issues    Patient is here for a 1 month follow up   Health Maintenance    Patient is due for a bone density     HPI: Patient is a 87 y.o. female seen today for medical management of chronic diseases.   PMH: aortic atherosclerosis, cerebral AV malformation, HTN, HLD, Heart block AV-s/p PPM 05/30, MVP, afib, epilepsy, encephalopathy, neuropathy, anemia, and unstable gait.     She is s/p lap chole 04/23/22 and had and post op seizure.   S/p PPM placement on 05/30 for CHB  01/27/22 Diagnosed with Pericarditis with Pericardial Effusion, now on cholchicine  Reviewed echo 02/13/22 which showed unchanged moderate pericardial effusion EF 50-55%  Hx of cdiff and intermittent loose stools. Has hx of this with hyponatremia.   Afib not on DOAC due to cerebral AV malformation  Memory loss: very pleasant, ambulatory  MMSE 27/30 Dec of 2023  Recovered from covid, still feels tired, she tested positive on 4/12 and was given Paxlovid. Did not require oxygen or have sob.  Has been losing weight with decreased appetite and taste sensation since covid.  She had dropped down to 110 but has gained back 4 lbs. Taking boost 1-2 times day Wt Readings from Last 3 Encounters:   10/08/22 114 lb (51.7 kg)  09/06/22 128 lb 15.5 oz (58.5 kg)  09/04/22 129 lb (58.5 kg)  BP trending down Reports she was having diarrhea but this has improved. Stool is now formed typically going once per day but did go more over the weekend.  Using a walker for ambulation. She was sent to the ED as a doppler study done here at [facility] [place] a DVT and she is not able to take a DOAC due to hx of cerebral AV malformation. The study in the ED 09/06/22 showed a bakers cysts not a DVT.  Left leg still has some posterior swelling but no pain.    Past Medical History:  Diagnosis Date   Abnormal cardiovascular stress test    Carotid arterial disease (HCC)    MODERATE RIGHT   Cerebral AV malformation    DDD (degenerative disc disease)    SEVERE WITH SPINAL STENOSIS   Hypercholesterolemia    Hypertension    MVP (mitral valve prolapse)    MILD   Numbness    LEFT FOOT AND ANKLE   Osteopenia    Presence of permanent cardiac pacemaker    Pseudo-gout     Past Surgical History:  Procedure Laterality Date   APPENDECTOMY     BREAST MASS EXCISION     CARPAL TUNNEL RELEASE     CHOLECYSTECTOMY N/A 04/23/2022   Procedure: LAPAROSCOPIC CHOLECYSTECTOMY;  Surgeon: Abigail Miyamoto, MD;  Location: [facility];  Service: General;  Laterality: N/A;   gallbladder     PACEMAKER IMPLANT N/A 11/07/2021   Procedure: PACEMAKER IMPLANT;  Surgeon: Marinus Maw, MD;  Location: [facility];  Service: Cardiovascular;  Laterality: N/A;   REMOVAL OF PARATHYROID GLAND     STRESS NUCLEAR STUDY     ABNORMAL   TONSILLECTOMY      Allergies  Allergen Reactions   Aleve [Naproxen] Other (See Comments)   Azithromycin Other (See Comments)    Reaction not recalled   Penicillins Hives and Other (See Comments)    Bad reaction as a child after a shot of Penicillin   Pravastatin Other (See Comments)    Reaction not recalled   Sulfa Antibiotics Other (See Comments)   Sulfa Drugs Cross Reactors Other (See  Comments)    Reaction not recalled- stated she can take it "in small doses"   Tape Itching and Other (See Comments)    Cannot wear for an extended period of time   Naproxen Sodium Rash    Outpatient Encounter Medications as of 10/08/2022  Medication Sig   acetaminophen (TYLENOL) 500 MG tablet Take 500 mg by mouth every 6 (six) hours as needed for mild pain or headache.   b complex vitamins capsule Take 1 capsule by mouth daily.   diclofenac Sodium (VOLTAREN) 1 % GEL Apply 4 g topically 4 (four) times daily.   fluticasone (FLONASE) 50 MCG/ACT nasal spray Place into both nostrils daily.   furosemide (LASIX) 20 MG tablet Take 20 mg by mouth daily.   hydrALAZINE (APRESOLINE) 50 MG tablet Take 50 mg by mouth 4 (four) times daily.   ipratropium-albuterol (DUONEB) 0.5-2.5 (3) MG/3ML SOLN Take 3 mLs by nebulization every 6 (six) hours as needed.   loperamide (IMODIUM A-D) 2 MG tablet Take 2 mg by mouth every 4 (four) hours as needed for diarrhea or loose stools.   magnesium oxide (MAG-OX) 400 (240 Mg) MG tablet Take 400 mg by mouth in the morning, at noon, and at bedtime.   Multiple Vitamins-Iron (MULTI-VITAMIN/IRON PO) Take by mouth.   Nutritional Supplements (NUTRITIONAL DRINK PO) Take 1 Bottle by mouth daily. Chocolate Boost or Ensure with lunch   ondansetron (ZOFRAN-ODT) 4 MG disintegrating tablet Take 4 mg by mouth every 4 (four) hours as needed for nausea or vomiting.   PHENobarbital (LUMINAL) 64.8 MG tablet Take 64.8 mg by mouth daily. Take 1 tablet every morning   phenytoin (DILANTIN) 100 MG ER capsule Take 200 mg by mouth daily.   Polyethyl Glycol-Propyl Glycol (SYSTANE) 0.4-0.3 % SOLN Apply to eye.   potassium chloride (KLOR-CON M) 10 MEQ tablet Take 20 mEq by mouth daily.   Probiotic Product (ALIGN) 4 MG CAPS Take 4 mg by mouth daily.   valsartan (DIOVAN) 160 MG tablet Take 1 tablet (160 mg total) by mouth 2 (two) times daily.   [DISCONTINUED] colchicine 0.6 MG tablet Take 1 tablet (0.6  mg total) by mouth daily.   hydrocortisone cream 1 % Apply 1 Application topically 3 (three) times daily as needed for itching. (Patient not taking: Reported on 09/04/2022)   No facility-administered encounter medications on file as of 10/08/2022.    Review of Systems:  Review of Systems  Constitutional:  Positive for fatigue. Negative for activity change, appetite change, chills, diaphoresis and fever.       Weight loss  HENT:  Negative for congestion.   Respiratory:  Negative for cough, shortness of breath and wheezing.   Cardiovascular:  Positive for leg swelling. Negative for chest pain and palpitations.  Gastrointestinal:  Negative for abdominal distention, abdominal pain, constipation and diarrhea.  Genitourinary:  Negative for difficulty urinating and dysuria.  Musculoskeletal:  Positive for gait problem. Negative for arthralgias, back pain, joint swelling and myalgias.  Neurological:  Positive for seizures (h/o). Negative for dizziness, tremors, syncope, facial asymmetry, speech difficulty, weakness, light-headedness, numbness and headaches.  Psychiatric/Behavioral:  Negative for agitation, behavioral problems and confusion.     Health Maintenance  Topic Date Due   DEXA SCAN  Never done   Medicare Annual Wellness (AWV)  03/15/2022   COVID-19 Vaccine (6 - 2023-24 season) 10/24/2022 (Originally 02/09/2022)   Pneumonia Vaccine 69+ Years old (2 of 2 - PPSV23 or PCV20) 06/27/2023 (Originally 11/26/2015)   INFLUENZA VACCINE  01/10/2023   Zoster Vaccines- Shingrix  Completed   HPV VACCINES  Aged Out   DTaP/Tdap/Td  Discontinued    Physical Exam: Vitals:   10/08/22 1436  BP: 122/61  Pulse: 61  Resp: 17  Temp: 97.9 F (36.6 C)  TempSrc: Temporal  SpO2: 98%  Weight: 114 lb (51.7 kg)  Height: 5\' 6"  (1.676 m)   Body mass index is 18.4 kg/m. Physical Exam Vitals and nursing note reviewed.  Constitutional:      General: She is not in acute distress.    Appearance: She is not  diaphoretic.  HENT:     Head: Normocephalic and atraumatic.     Mouth/Throat:     Mouth: Mucous membranes are moist.     Pharynx: Oropharynx is clear. No oropharyngeal exudate.  Neck:     Vascular: No JVD.  Cardiovascular:     Rate and Rhythm: Normal rate and regular rhythm.     Heart sounds: Murmur heard.  Pulmonary:     Effort: Pulmonary effort is normal. No respiratory distress.     Breath sounds: Normal breath sounds. No wheezing.  Abdominal:     General: Bowel sounds are normal. There is no distension.     Palpations: Abdomen is soft.     Tenderness: There is no abdominal tenderness.     Hernia: A hernia (right inguinal) is present.  Musculoskeletal:     Comments: Left posterior knee with swelling, hx of bakers cyst. No warmth or tenderness.  Trace edema to BLE  Skin:    General: Skin is warm and dry.  Neurological:     Mental Status: She is alert and oriented to person, place, and time.  Psychiatric:        Mood and Affect: Mood normal.     Labs reviewed: Basic Metabolic Panel: Recent Labs    01/27/22 1116 01/28/22 0331 04/22/22 1826 04/23/22 0811 04/24/22 0504 04/25/22 0735 04/26/22 0901 04/29/22 0436 05/29/22 0000 06/01/22 0000 08/28/22 1131  NA  --    < >  --    < > 136 135 136   < > 138 136* 141  K  --    < >  --    < > 3.7 3.7 3.8   < > 4.0 4.2 5.6*  CL  --    < >  --    < > 103 103 105   < > 101 99 104  CO2  --    < >  --    < > 22 19* 21*   < > 28* 28* 20  GLUCOSE  --    < >  --    < > 91 89 98  --   --   --  95  BUN  --    < >  --    < > 29* 29* 27*   < > 22* 32* 38*  CREATININE  --    < >  --    < > 1.14* 1.32* 1.24*   < > 0.9 1.2* 1.20*  CALCIUM  --    < > 8.1*   < > 8.1* 8.4* 8.6*   < > 9.2 9.1 9.7  MG 1.7  --  1.1*  --  1.8 1.9  --   --   --   --   --   PHOS  --   --   --   --  4.4 3.5  --   --   --   --   --   TSH 1.914  --   --   --   --  4.325  --   --   --   --   --    < > = values in this interval not displayed.   Liver Function  Tests: Recent Labs    04/24/22 0504 04/25/22 0735 04/26/22 0901 05/07/22 0000 06/01/22 0000  AST 24 21 18 26 30   ALT 16 15 12 16 25   ALKPHOS 52 53 54 59 77  BILITOT 0.7 1.1 0.9  --   --   PROT 5.8* 5.9* 5.9*  --   --   ALBUMIN 2.8* 2.8* 2.6* 3.0* 3.5   Recent Labs    10/15/21 1456 01/27/22 0830 04/22/22 0434  LIPASE 54* 53* 42   Recent Labs    04/26/22 0901  AMMONIA 18   CBC: Recent Labs    04/24/22 0504 04/25/22 0735 04/26/22 0901 05/07/22 0000 05/29/22 0000 06/01/22 0000  WBC 11.3* 8.9 5.7 6.7 4.8 6.3  NEUTROABS 10.0* 7.6  --   --   --  5.20  HGB 9.1* 9.5* 10.2* 10.0* 9.7* 10.2*  HCT 28.1* 29.3* 32.4* 29* 29* 31*  MCV 104.5* 103.5* 106.2*  --   --   --   PLT 237 274 283 411* 221 254   Lipid Panel: Recent Labs    01/28/22 1254  CHOL 208*  HDL 80  LDLCALC 116*  TRIG 59  CHOLHDL 2.6   Lab Results  Component Value Date   HGBA1C 4.9 04/22/2022    Procedures since last visit: No results found.  Assessment/Plan  1. Post-COVID chronic fatigue Likely her fatigue is due to poor nutrition and covid but will check labs.  2. Weight loss Continue boost Check labs.  See above.   3. Acute pericarditis, unspecified type Followed by cardiology On colchicine, encouraged staff to report if having diarrhea.   4. Paroxysmal atrial fibrillation (HCC) Rate controlled  DOAC deferred   5. Primary hypertension Much improved after weight loss Monitor for hypotension Check labs, consider lasix dose reduction if weight drops further.   6. Heart block AV complete (HCC) S/p pacemaker  7. Cerebral AV malformation Noted  8. Partial symptomatic epilepsy with complex partial seizures, not intractable, without status epilepticus (HCC) On long term pheyntoin and phenobarb   Labs/tests ordered:  * No order type specified *TSH, Mag, phenytoin level, phenobarb level, CMP CBC B12 in am  Next appt:  3 months with Dr Chales Abrahams.   Pt would like to postpone AWV and  dexa scan for right now. She is still feeling poorly after having covid.    Total time :  time greater than 50% of total time spent doing pt  counseling and coordination of care

## 2022-10-09 DIAGNOSIS — R634 Abnormal weight loss: Secondary | ICD-10-CM | POA: Diagnosis not present

## 2022-10-09 DIAGNOSIS — R531 Weakness: Secondary | ICD-10-CM | POA: Diagnosis not present

## 2022-10-09 DIAGNOSIS — Z79899 Other long term (current) drug therapy: Secondary | ICD-10-CM | POA: Diagnosis not present

## 2022-11-07 ENCOUNTER — Ambulatory Visit (INDEPENDENT_AMBULATORY_CARE_PROVIDER_SITE_OTHER): Payer: Medicare Other

## 2022-11-07 DIAGNOSIS — I442 Atrioventricular block, complete: Secondary | ICD-10-CM

## 2022-11-07 LAB — CUP PACEART REMOTE DEVICE CHECK
Battery Remaining Longevity: 132 mo
Battery Voltage: 3.05 V
Brady Statistic AP VP Percent: 55.32 %
Brady Statistic AP VS Percent: 0.04 %
Brady Statistic AS VP Percent: 42.76 %
Brady Statistic AS VS Percent: 2.8 %
Brady Statistic RA Percent Paced: 4.78 %
Brady Statistic RV Percent Paced: 94.32 %
Date Time Interrogation Session: 20240528191258
Implantable Lead Connection Status: 753985
Implantable Lead Connection Status: 753985
Implantable Lead Implant Date: 20230530
Implantable Lead Implant Date: 20230530
Implantable Lead Location: 753859
Implantable Lead Location: 753860
Implantable Lead Model: 3830
Implantable Lead Model: 5076
Implantable Pulse Generator Implant Date: 20230530
Lead Channel Impedance Value: 171 Ohm
Lead Channel Impedance Value: 304 Ohm
Lead Channel Impedance Value: 418 Ohm
Lead Channel Impedance Value: 418 Ohm
Lead Channel Pacing Threshold Amplitude: 1.125 V
Lead Channel Pacing Threshold Amplitude: 1.375 V
Lead Channel Pacing Threshold Pulse Width: 0.4 ms
Lead Channel Pacing Threshold Pulse Width: 0.4 ms
Lead Channel Sensing Intrinsic Amplitude: 0.25 mV
Lead Channel Sensing Intrinsic Amplitude: 0.25 mV
Lead Channel Sensing Intrinsic Amplitude: 15.125 mV
Lead Channel Sensing Intrinsic Amplitude: 16.125 mV
Lead Channel Setting Pacing Amplitude: 2 V
Lead Channel Setting Pacing Amplitude: 2.25 V
Lead Channel Setting Pacing Pulse Width: 0.4 ms
Lead Channel Setting Sensing Sensitivity: 1.2 mV
Zone Setting Status: 755011

## 2022-11-12 ENCOUNTER — Ambulatory Visit: Payer: Medicare Other | Admitting: Cardiology

## 2022-11-28 NOTE — Progress Notes (Signed)
Remote pacemaker transmission.   

## 2022-12-17 ENCOUNTER — Other Ambulatory Visit: Payer: Self-pay | Admitting: Internal Medicine

## 2022-12-17 MED ORDER — PHENOBARBITAL 64.8 MG PO TABS: 64.8000 mg | ORAL_TABLET | Freq: Every day | ORAL | 3 refills | Status: DC

## 2022-12-20 DIAGNOSIS — Z79899 Other long term (current) drug therapy: Secondary | ICD-10-CM | POA: Diagnosis not present

## 2022-12-20 LAB — BASIC METABOLIC PANEL
BUN/Creatinine Ratio: 28 (ref 12–28)
BUN: 34 mg/dL — ABNORMAL HIGH (ref 8–27)
CO2: 21 mmol/L (ref 20–29)
Calcium: 9.9 mg/dL (ref 8.7–10.3)
Chloride: 103 mmol/L (ref 96–106)
Creatinine, Ser: 1.22 mg/dL — ABNORMAL HIGH (ref 0.57–1.00)
Glucose: 87 mg/dL (ref 70–99)
Potassium: 5.4 mmol/L — ABNORMAL HIGH (ref 3.5–5.2)
Sodium: 138 mmol/L (ref 134–144)
eGFR: 44 mL/min/{1.73_m2} — ABNORMAL LOW (ref >59)

## 2022-12-20 NOTE — Progress Notes (Signed)
Cardiology Clinic Note   Patient Name: [name] Date of Encounter: 12/27/2022  Primary Care Provider:  Mahlon Gammon, MD Primary Cardiologist:  Peter Swaziland, MD  Patient Profile    [name] 87 year old female presents the clinic today for follow-up evaluation of her essential hypertension mitral valve prolapse, and paroxysmal atrial fibrillation.  Past Medical History    Past Medical History:  Diagnosis Date   Abnormal cardiovascular stress test    Carotid arterial disease (HCC)    MODERATE RIGHT   Cerebral AV malformation    DDD (degenerative disc disease)    SEVERE WITH SPINAL STENOSIS   Hypercholesterolemia    Hypertension    MVP (mitral valve prolapse)    MILD   Numbness    LEFT FOOT AND ANKLE   Osteopenia    Presence of permanent cardiac pacemaker    Pseudo-gout    Past Surgical History:  Procedure Laterality Date   APPENDECTOMY     BREAST MASS EXCISION     CARPAL TUNNEL RELEASE     CHOLECYSTECTOMY N/A 04/23/2022   Procedure: LAPAROSCOPIC CHOLECYSTECTOMY;  Surgeon: Abigail Miyamoto, MD;  Location: [facility];  Service: General;  Laterality: N/A;   gallbladder     PACEMAKER IMPLANT N/A 11/07/2021   Procedure: PACEMAKER IMPLANT;  Surgeon: Marinus Maw, MD;  Location: [facility];  Service: Cardiovascular;  Laterality: N/A;   REMOVAL OF PARATHYROID GLAND     STRESS NUCLEAR STUDY     ABNORMAL   TONSILLECTOMY      Allergies  Allergies  Allergen Reactions   Aleve [Naproxen] Other (See Comments)   Azithromycin Other (See Comments)    Reaction not recalled   Penicillins Hives and Other (See Comments)    Bad reaction as a child after a shot of Penicillin   Pravastatin Other (See Comments)    Reaction not recalled   Sulfa Antibiotics Other (See Comments)   Sulfa Drugs Cross Reactors Other (See Comments)    Reaction not recalled- stated she can take it "in small doses"   Tape Itching and Other (See Comments)    Cannot wear for an  extended period of time   Naproxen Sodium Rash    History of Present Illness    [name] is a PMH of hypertension, carotid artery disease, mitral valve prolapse, cerebral AV malformation, acute pericarditis, AV complete heart block, acute cholecystitis, neuropathy, acute kidney injury superimposed on chronic kidney disease, hypercholesterolemia, low back pain, hypomagnesemia, diarrhea, elevated LFTs, iron deficiency anemia, syncope, dyslipidemia, and is status post PPM.  Has a history of mildly abnormal nuclear stress test in 2010.  She is not on aspirin due to history of cerebral AV malformation.  She lives at [facility].  Also has a history of recurrent syncope.  She was hospitalized 2/22 with syncope, confusion and hyponatremia following diarrheal illness.  Carotid Doppler 7/22 showed 40-59% B ICA stenosis which was stable from previous study.  She presented to the ED 5/23 with syncope.  Her echocardiogram showed an EF of 45-50%, LVH, normal RV function, mild MR, pericardial effusion.  On telemetry she was noted to have frequent PVCs.  She was hospitalized 10/15/2021 until 10/17/2021.  She was discharged back to assisted living.  She wore a 14-day live Zio patch which showed intermittent complete heart block up to 10 seconds.  She followed up with Dr. Ladona Ridgel on 11/03/2021 who recommended PPM for complete heart block.  She underwent implantation on 11/07/2021.  She was discharged to skilled nursing facility on 11/07/2021.  She tolerated PPM insertion well.  On 11/14/2021 she was noted to have new onset atrial fibrillation.  She was seen in the A-fib clinic 7/23 and was doing well.  She is not a candidate for anticoagulation due to her cerebral AVMs.  She is not a candidate for Watchman device since she cannot take antiplatelet or anticoagulants.  She was admitted 01/27/2022 with pleuritic chest pain.  She was ruled out for MI.  Her coronary CTA showed nonobstructive CAD.  She was noted to  have moderate pericardial effusion which was confirmed via echocardiogram.  At that time she was noted to have normal PPM parameters suggesting no lead migration.  Her pericarditis was treated with colchicine.  She followed up with Dr. Swaziland 03/05/2022.  During that time she denied recurrent syncope.  Her last device check showed A-fib burden of about 60% which was rate controlled.  She was completely asymptomatic.  She was placed on amiodarone 12 days prior to the visit.  This correlated with GI upset.  Amiodarone was discontinued.  Rate control was felt to be best course of treatment.  Follow-up echocardiogram 02/13/2022 showed no change in moderate effusion, no evidence of tamponade, and plan for repeat echocardiogram 3/24 was made.  She denied chest pain.  She presented to the clinic 06/25/2022 for follow-up evaluation and stated she was waiting on getting into a group exercise program at [facility].  She reported that she had recent surgery.  On review of the paperwork that she presented with, she underwent lap chole on 04/23/2022.  Postoperatively she was noted to have a seizure.  She reported she had follow-up with neurology planned.  She denied cardiac awareness.  She was noted to have bilateral ankle edema.  She was compliant with lower extremity support stockings.  She denied chest pressure and pain.  She denied palpitations.  She continued to ambulate with a rollator.  We  planned for follow-up echocardiogram 3/24 and plan follow-up after testing.  She presented to the clinic 08/28/22 for follow-up evaluation.  She reported that she felt that she had less endurance.  She continued to wear lower extremity support stockings and reported that her fluid status had improved.  She continued to have generalized bilateral lower extremity edema left greater than right.  We reviewed her medications and follow-up echocardiogram.  She expressed understanding.  I  started colchicine x 3 months and plan follow-up in  4 months.  I planned repeat  BMP that day and in 2 weeks.  Repeat BMP 09/18/2022 showed slightly elevated sodium and normal potassium her creatinine was 1.3.  BMP on 12/10/2022 showed elevated potassium at 5.4 and improved creatinine.  She presents to the clinic today for follow-up evaluation and states she feels well.  She continues to be somewhat physically active and wear her lower extremity support stockings.  We reviewed her lab work from 12/20/2022 and her medications.  She expressed understanding.  She denies episodes of chest discomfort with laying back.  She completed colchicine therapy.  I will order reduced dose of potassium 10 mill equivalents, order BMP, and plan follow-up in 4 to 6 months.  Today she denies chest pain, shortness of breath, fatigue, palpitations, melena, hematuria, hemoptysis, diaphoresis, weakness, presyncope, syncope, orthopnea, and PND.      Home Medications    Prior to Admission medications   Medication Sig Start Date End Date Taking? Authorizing Provider  acetaminophen (TYLENOL) 500 MG tablet Take 500  mg by mouth every 6 (six) hours as needed for mild pain or headache.    [provider]  B Complex Vitamins (VITAMIN-B COMPLEX) TABS Take 1 tablet by mouth daily.    [provider]  Dextromethorphan-guaiFENesin (ROBAFEN DM) 10-100 MG/5ML liquid Take 10 mLs by mouth every 6 (six) hours as needed (cough/congestion).    [provider]  fluticasone (FLONASE) 50 MCG/ACT nasal spray Place 1-2 sprays into both nostrils daily.    [provider]  hydrALAZINE (APRESOLINE) 50 MG tablet Take 1 tablet (50 mg total) by mouth 4 (four) times daily. 06/08/22 07/08/22  Fletcher Anon, NP  hydrocortisone cream 1 % Apply 1 Application topically 3 (three) times daily as needed for itching. 05/09/22   Fargo, Amy E, NP  ipratropium-albuterol (DUONEB) 0.5-2.5 (3) MG/3ML SOLN Take 3 mLs by nebulization every 6 (six) hours as needed.    [provider]  loperamide (IMODIUM A-D) 2 MG tablet Take 2 mg by mouth every 4 (four) hours as needed for diarrhea or loose stools.    [provider]  magnesium oxide (MAG-OX) 400 (240 Mg) MG tablet Take 400 mg by mouth daily.    [provider]  Multiple Vitamins-Minerals (MULTI COMPLETE/IRON) TABS Take 1 tablet by mouth daily with breakfast. Folic acid included    [provider]  Nutritional Supplements (NUTRITIONAL DRINK PO) Take 1 Bottle by mouth daily. Chocolate Boost or Ensure with lunch    [provider]  ondansetron (ZOFRAN-ODT) 4 MG disintegrating tablet Take 4 mg by mouth every 4 (four) hours as needed for nausea or vomiting. 02/20/22   [provider]  PHENobarbital (LUMINAL) 64.8 MG tablet Take 1 tablet (64.8 mg total) by mouth in the morning. 06/19/22   Fargo, Amy E, NP  phenytoin (DILANTIN) 100 MG ER capsule Take 200 mg by mouth daily with breakfast.    [provider]  Polyethyl Glycol-Propyl Glycol (SYSTANE ULTRA) 0.4-0.3 % SOLN Place 1 drop into both eyes in the morning and at bedtime.    [provider]  Probiotic Product (ALIGN) 4 MG CAPS Take 4 mg by mouth daily.    [provider]  valsartan (DIOVAN) 160 MG tablet Take 1 tablet (160 mg total) by mouth daily. 12/11/21   Joylene Grapes, NP    Family History    Family History  Problem Relation Age of Onset   Stroke Mother    Lung cancer Father    Breast cancer Neg Hx    She indicated that her mother is deceased. She indicated that her father is deceased. She indicated that both of her brothers are alive. She indicated that her maternal grandmother is deceased. She indicated that her maternal grandfather is deceased. She indicated that her paternal grandmother is deceased. She indicated that her paternal grandfather is deceased. She indicated that the status of her neg hx is unknown.  Social History    Social History   Socioeconomic History   Marital  status: Widowed    Spouse name: Not on file   Number of children: 3   Years of education: Not on file   Highest education level: Not on file  Occupational History   Not on file  Tobacco Use   Smoking status: Former    Current packs/day: 0.00    Average packs/day: 1 pack/day for 14.0 years (14.0 ttl pk-yrs)    Types: Cigarettes    Start date: 11/08/1954    Quit date: 11/07/1968  Years since quitting: 54.1   Smokeless tobacco: Never  Vaping Use   Vaping status: Never Used  Substance and Sexual Activity   Alcohol use: No   Drug use: No   Sexual activity: Not on file  Other Topics Concern   Not on file  Social History Narrative   [place] [employer]    Right handed    Social Determinants of Health   Financial Resource Strain: Not on file  Food Insecurity: No Food Insecurity (04/22/2022)   Hunger Vital Sign    Worried About Running Out of Food in the Last Year: Never true    Ran Out of Food in the Last Year: Never true  Transportation Needs: No Transportation Needs (04/22/2022)   PRAPARE - Administrator, Civil Service (Medical): No    Lack of Transportation (Non-Medical): No  Physical Activity: Not on file  Stress: Not on file  Social Connections: Not on file  Intimate Partner Violence: Not At Risk (04/22/2022)   Humiliation, Afraid, Rape, and Kick questionnaire    Fear of Current or Ex-Partner: No    Emotionally Abused: No    Physically Abused: No    Sexually Abused: No     Review of Systems    General:  No chills, fever, night sweats or weight changes.  Cardiovascular:  No chest pain, dyspnea on exertion, bilateral lower extremity nonpitting ankle edema left greater than right, orthopnea, palpitations, paroxysmal nocturnal dyspnea. Dermatological: No rash, lesions/masses Respiratory: No cough, dyspnea Urologic: No hematuria, dysuria Abdominal:   No nausea, vomiting, diarrhea, bright red blood per rectum, melena, or hematemesis Neurologic:   No visual changes, wkns, changes in mental status. All other systems reviewed and are otherwise negative except as noted above.  Physical Exam    VS:  BP (!) 138/56 (BP Location: Left Arm, Patient Position: Sitting, Cuff Size: Normal)   Pulse (!) 59   Ht 5' 6.5" (1.689 m)   Wt 119 lb 3.2 oz (54.1 kg)   SpO2 98%   BMI 18.95 kg/m  , BMI Body mass index is 18.95 kg/m. GEN: Well nourished, well developed, in no acute distress. HEENT: normal. Neck: Supple, no JVD, carotid bruits, or masses. Cardiac: RRR, no murmurs, rubs, or gallops. No clubbing, cyanosis, edema.  Radials/DP/PT 2+ and equal bilaterally.  Respiratory:  Respirations regular and unlabored, clear to auscultation bilaterally. GI: Soft, nontender, nondistended, BS + x 4. MS: no deformity or atrophy. Skin: warm and dry, no rash. Neuro:  Strength and sensation are intact. Psych: Normal affect.  Accessory Clinical Findings    Recent Labs: 04/25/2022: Magnesium 1.9; TSH 4.325 09/18/2022: ALT 27; Hemoglobin 10.0; Platelets 243 12/20/2022: BUN 34; Creatinine, Ser 1.22; Potassium 5.4; Sodium 138   Recent Lipid Panel    Component Value Date/Time   CHOL 208 (H) 01/28/2022 1254   TRIG 59 01/28/2022 1254   HDL 80 01/28/2022 1254   CHOLHDL 2.6 01/28/2022 1254   VLDL 12 01/28/2022 1254   LDLCALC 116 (H) 01/28/2022 1254         ECG personally reviewed by me today-none today.  Echocardiogram 02/13/2022  IMPRESSIONS     1. Moderate pericardial effusion. Unchanged from prior study. Moderate  pericardial effusion. The pericardial effusion is circumferential. There  is no evidence of cardiac tamponade.   2. Left ventricular ejection fraction, by estimation, is 55 to 60%. The  left ventricle has normal function. The left ventricle has no regional  wall motion abnormalities.   3.  Right ventricular systolic function is normal. The right ventricular  size is normal. Tricuspid regurgitation signal is inadequate for assessing  PA  pressure.   4. The mitral valve is myxomatous. Mild mitral valve regurgitation.   5. The inferior vena cava is normal in size with greater than 50%  respiratory variability, suggesting right atrial pressure of 3 mmHg.   Comparison(s): No significant change from prior study. 01/28/22 EF 50-55%.  Moderate pericardial effusion.   FINDINGS   Left Ventricle: Left ventricular ejection fraction, by estimation, is 55  to 60%. The left ventricle has normal function. The left ventricle has no  regional wall motion abnormalities. Global longitudinal strain performed  but not reported based on  interpreter judgement due to suboptimal tracking. The left ventricular  internal cavity size was normal in size. There is no left ventricular  hypertrophy.   Right Ventricle: The right ventricular size is normal. No increase in  right ventricular wall thickness. Right ventricular systolic function is  normal. Tricuspid regurgitation signal is inadequate for assessing PA  pressure.   Pericardium: Moderate pericardial effusion. Unchanged from prior study. A  moderately sized pericardial effusion is present. The pericardial effusion  is circumferential. There is no evidence of cardiac tamponade.   Mitral Valve: The mitral valve is myxomatous. Mild mitral valve  regurgitation. MV peak gradient, 16.3 mmHg.   Tricuspid Valve: The tricuspid valve is grossly normal. Tricuspid valve  regurgitation is trivial. No evidence of tricuspid stenosis.   Pulmonic Valve: The pulmonic valve was grossly normal. Pulmonic valve  regurgitation is not visualized.   Aorta: The aortic root is normal in size and structure.   Venous: The inferior vena cava is normal in size with greater than 50%  respiratory variability, suggesting right atrial pressure of 3 mmHg.   Echocardiogram 08/23/2022  IMPRESSIONS     1. Left ventricular ejection fraction, by estimation, is 60 to 65%. The  left ventricle has normal function. The  left ventricle has no regional  wall motion abnormalities. There is mild concentric left ventricular  hypertrophy. Left ventricular diastolic  function could not be evaluated. Elevated left ventricular end-diastolic  pressure.   2. Right ventricular systolic function is normal. The right ventricular  size is normal. There is severely elevated pulmonary artery systolic  pressure.   3. Left atrial size was severely dilated.   4. Right atrial size was severely dilated.   5. Mostly posterior pericardial effusion measuring 1.77 cm. Compared with  the echo 02/2022, the effusion is larger. No evidence of tamponade.  Moderate pericardial effusion. There is no evidence of cardiac tamponade.  Moderate pleural effusion in the left  lateral region.   6. The mitral valve is normal in structure. Trivial mitral valve  regurgitation. No evidence of mitral stenosis.   7. The aortic valve is tricuspid. Aortic valve regurgitation is not  visualized. No aortic stenosis is present.   8. The inferior vena cava is normal in size with <50% respiratory  variability, suggesting right atrial pressure of 8 mmHg.   FINDINGS   Left Ventricle: Left ventricular ejection fraction, by estimation, is 60  to 65%. The left ventricle has normal function. The left ventricle has no  regional wall motion abnormalities. The left ventricular internal cavity  size was normal in size. There is   mild concentric left ventricular hypertrophy. Left ventricular diastolic  function could not be evaluated due to atrial fibrillation. Left  ventricular diastolic function could not be evaluated. Elevated left  ventricular end-diastolic  pressure.   Right Ventricle: The right ventricular size is normal. No increase in  right ventricular wall thickness. Right ventricular systolic function is  normal. There is severely elevated pulmonary artery systolic pressure. The  tricuspid regurgitant velocity is  3.67 m/s, and with an assumed right  atrial pressure of 8 mmHg, the  estimated right ventricular systolic pressure is 61.9 mmHg.   Left Atrium: Left atrial size was severely dilated.   Right Atrium: Right atrial size was severely dilated.   Pericardium: Mostly posterior pericardial effusion measuring 1.77 cm.  Compared with the echo 02/2022, the effusion is larger. No evidence of  tamponade. A moderately sized pericardial effusion is present. There is no  evidence of cardiac tamponade.   Mitral Valve: The mitral valve is normal in structure. Trivial mitral  valve regurgitation. No evidence of mitral valve stenosis.   Tricuspid Valve: The tricuspid valve is normal in structure. Tricuspid  valve regurgitation is mild . No evidence of tricuspid stenosis.   Aortic Valve: The aortic valve is tricuspid. Aortic valve regurgitation is  not visualized. No aortic stenosis is present.   Pulmonic Valve: The pulmonic valve was normal in structure. Pulmonic valve  regurgitation is mild to moderate. No evidence of pulmonic stenosis.   Aorta: The aortic root is normal in size and structure.   Venous: The inferior vena cava is normal in size with less than 50%  respiratory variability, suggesting right atrial pressure of 8 mmHg.   IAS/Shunts: No atrial level shunt detected by color flow Doppler.   Additional Comments: There is a moderate pleural effusion in the left  lateral region.   Assessment & Plan   Idiopathic acute pericarditis, pericardial effusion-denies recent episodes of chest discomfort.  Echocardiogram 08/23/2022 showed  moderate pericardial effusion increased from 02/13/2022 echocardiogram.  There was no evidence of tamponade.  She was started on colchicine 0.5 mg daily.  Colchicine therapy completed. Continue furosemide Order BMP-recommendation for holding potassium supplementation was made at that time.  Hyperkalemia-potassium noted to be 5.4 on 12/20/2022.  Creatinine had improved to 1.22.  Patient was instructed to  hold potassium supplementation at that time. Repeat BMP Increase p.o. hydration  Essential hypertension-BP today 138/56.   Continue valsartan Heart healthy low-sodium diet Maintain blood pressure log  Hyperlipidemia-continues on simvastatin. Heart healthy low-sodium high-fiber diet Maintain physical activity  Paroxysmal atrial fibrillation-device check 11/06/2022 showed appropriate histograms, leads and battery stable with normal device function.  With known persistent A-fib.  Not a candidate for anticoagulation or Watchman due to cerebral AV malformation. Continue to monitor Follows with EP    Disposition: Follow-up with Dr. Swaziland in 4-6 months.   Thomasene Ripple. Virdie Penning NP-C     12/27/2022, 11:41 AM [facility] [address] Office [phone] Fax [phone]    I spent 14 minutes examining this patient, reviewing medications, and using patient centered shared decision making involving her cardiac care.  Prior to her visit I spent greater than 20 minutes reviewing her past medical history,  medications, and prior cardiac tests.

## 2022-12-27 ENCOUNTER — Ambulatory Visit: Payer: Medicare Other | Attending: General Practice | Admitting: General Practice

## 2022-12-27 ENCOUNTER — Encounter: Payer: Self-pay | Admitting: General Practice

## 2022-12-27 VITALS — BP 138/56 | HR 59 | Ht 66.5 in | Wt 119.2 lb

## 2022-12-27 DIAGNOSIS — E785 Hyperlipidemia, unspecified: Secondary | ICD-10-CM | POA: Insufficient documentation

## 2022-12-27 DIAGNOSIS — I480 Paroxysmal atrial fibrillation: Secondary | ICD-10-CM | POA: Insufficient documentation

## 2022-12-27 DIAGNOSIS — E875 Hyperkalemia: Secondary | ICD-10-CM | POA: Insufficient documentation

## 2022-12-27 DIAGNOSIS — I10 Essential (primary) hypertension: Secondary | ICD-10-CM | POA: Diagnosis not present

## 2022-12-27 DIAGNOSIS — I3139 Other pericardial effusion (noninflammatory): Secondary | ICD-10-CM | POA: Insufficient documentation

## 2022-12-27 MED ORDER — POTASSIUM CHLORIDE CRYS ER 10 MEQ PO TBCR: 10.0000 meq | EXTENDED_RELEASE_TABLET | Freq: Every day | ORAL | 3 refills | Status: DC

## 2022-12-27 NOTE — Patient Instructions (Signed)
Medication Instructions:  - Decrease your potassium to 1 tablet (10 mEq) daily.  *If you need a refill on your cardiac medications before your next appointment, please call your pharmacy*   Lab Work: - BMET today  If you have labs (blood work) drawn today and your tests are completely normal, you will receive your results only by: MyChart Message (if you have MyChart) OR A paper copy in the mail If you have any lab test that is abnormal or we need to change your treatment, we will call you to review the results.   Testing/Procedures: - None ordered   Follow-Up: At [facility], you and your health needs are our priority.  As part of our continuing mission to provide you with exceptional heart care, we have created designated Provider Care Teams.  These Care Teams include your primary Cardiologist (physician) and Advanced Practice Providers (APPs -  Physician Assistants and Nurse Practitioners) who all work together to provide you with the care you need, when you need it.  We recommend signing up for the patient portal called "MyChart".  Sign up information is provided on this After Visit Summary.  MyChart is used to connect with patients for Virtual Visits (Telemedicine).  Patients are able to view lab/test results, encounter notes, upcoming appointments, etc.  Non-urgent messages can be sent to your provider as well.   To learn more about what you can do with MyChart, go to [email or web address].    Your next appointment:   6 month(s)  Provider:   Peter Swaziland, MD  or Edd Fabian, FNP

## 2022-12-28 ENCOUNTER — Other Ambulatory Visit: Payer: Self-pay | Admitting: Emergency Medicine

## 2022-12-28 DIAGNOSIS — E875 Hyperkalemia: Secondary | ICD-10-CM

## 2022-12-28 LAB — BASIC METABOLIC PANEL
BUN/Creatinine Ratio: 25 (ref 12–28)
BUN: 30 mg/dL — ABNORMAL HIGH (ref 8–27)
CO2: 22 mmol/L (ref 20–29)
Calcium: 10.1 mg/dL (ref 8.7–10.3)
Chloride: 101 mmol/L (ref 96–106)
Creatinine, Ser: 1.19 mg/dL — ABNORMAL HIGH (ref 0.57–1.00)
Glucose: 98 mg/dL (ref 70–99)
Potassium: 4.9 mmol/L (ref 3.5–5.2)
Sodium: 139 mmol/L (ref 134–144)
eGFR: 45 mL/min/{1.73_m2} — ABNORMAL LOW (ref >59)

## 2023-01-02 ENCOUNTER — Encounter: Payer: Self-pay | Admitting: Orthopedic Surgery

## 2023-01-02 ENCOUNTER — Non-Acute Institutional Stay: Payer: Medicare Other | Admitting: Orthopedic Surgery

## 2023-01-02 DIAGNOSIS — I501 Left ventricular failure: Secondary | ICD-10-CM | POA: Diagnosis not present

## 2023-01-02 DIAGNOSIS — J90 Pleural effusion, not elsewhere classified: Secondary | ICD-10-CM | POA: Diagnosis not present

## 2023-01-02 DIAGNOSIS — R051 Acute cough: Secondary | ICD-10-CM

## 2023-01-02 DIAGNOSIS — I517 Cardiomegaly: Secondary | ICD-10-CM | POA: Diagnosis not present

## 2023-01-02 DIAGNOSIS — J9809 Other diseases of bronchus, not elsewhere classified: Secondary | ICD-10-CM | POA: Diagnosis not present

## 2023-01-02 DIAGNOSIS — Z950 Presence of cardiac pacemaker: Secondary | ICD-10-CM | POA: Diagnosis not present

## 2023-01-02 MED ORDER — GUAIFENESIN ER 600 MG PO TB12: 600.0000 mg | ORAL_TABLET | Freq: Two times a day (BID) | ORAL | Status: AC

## 2023-01-02 MED ORDER — DOXYCYCLINE HYCLATE 100 MG PO TABS: 100.0000 mg | ORAL_TABLET | Freq: Two times a day (BID) | ORAL | Status: AC

## 2023-01-02 NOTE — Progress Notes (Signed)
Location:   [facility] Room Number: 626 Place of Service:  SNF [phone]) Provider:  Elie Confer, NP  Mahlon Gammon, MD  Patient Care Team: Mahlon Gammon, MD as PCP - General (Internal Medicine) [place], [name], MD as PCP - Cardiology (Cardiology) Van Clines, MD as Consulting Physician (Neurology)  Extended Emergency Contact Information Primary Emergency Contact: [name],[name] Mobile Phone: [phone] Relation: Daughter Secondary Emergency Contact: KYOKO, ELSEA Mobile Phone: [phone] Relation: Son  Code Status:  DNR Goals of care: Advanced Directive information    01/02/2023    2:17 PM  Advanced Directives  Does Patient Have a Medical Advance Directive? Yes  Type of Advance Directive Out of facility DNR (pink MOST or yellow form)  Does patient want to make changes to medical advance directive? No - Patient declined  Pre-existing out of facility DNR order (yellow form or pink MOST form) Yellow form placed in chart (order not valid for inpatient use)     Chief Complaint  Patient presents with   Acute Visit    Cough     HPI:  Pt is a 87 y.o. female seen today for an acute visit due to acute cough.   She currently resides on the assisted living unit at [facility]. PMH: aortic atherosclerosis, HTN, MVP, PAF, CHB s/p pacemaker 05/30, neuropathy, encephalopathy, epilepsy, iron deficiency anemia and low back pain.   07/22 she had a sore throat x 1 day. Acute cough began 07/23. Covid/flu negative. Cough sometimes productive with thick tan sputum. She denies chest pain, shortness of breath, nasal congestion, malaise or fever. Nursing has been giving her robitussin without relief. Afebrile. Vitals stable.   Past Medical History:  Diagnosis Date   Abnormal cardiovascular stress test    Carotid arterial disease (HCC)    MODERATE RIGHT   Cerebral AV malformation    DDD (degenerative disc disease)    SEVERE WITH SPINAL STENOSIS    Hypercholesterolemia    Hypertension    MVP (mitral valve prolapse)    MILD   Numbness    LEFT FOOT AND ANKLE   Osteopenia    Presence of permanent cardiac pacemaker    Pseudo-gout    Past Surgical History:  Procedure Laterality Date   APPENDECTOMY     BREAST MASS EXCISION     CARPAL TUNNEL RELEASE     CHOLECYSTECTOMY N/A 04/23/2022   Procedure: LAPAROSCOPIC CHOLECYSTECTOMY;  Surgeon: Abigail Miyamoto, MD;  Location: [facility];  Service: General;  Laterality: N/A;   gallbladder     PACEMAKER IMPLANT N/A 11/07/2021   Procedure: PACEMAKER IMPLANT;  Surgeon: Marinus Maw, MD;  Location: [facility];  Service: Cardiovascular;  Laterality: N/A;   REMOVAL OF PARATHYROID GLAND     STRESS NUCLEAR STUDY     ABNORMAL   TONSILLECTOMY      Allergies  Allergen Reactions   Aleve [Naproxen] Other (See Comments)   Azithromycin Other (See Comments)    Reaction not recalled   Penicillins Hives and Other (See Comments)    Bad reaction as a child after a shot of Penicillin   Pravastatin Other (See Comments)    Reaction not recalled   Sulfa Antibiotics Other (See Comments)   Sulfa Drugs Cross Reactors Other (See Comments)    Reaction not recalled- stated she can take it "in small doses"   Tape Itching and Other (See Comments)    Cannot wear for an extended period of time   Naproxen Sodium Rash  Allergies as of 01/02/2023       Reactions   Aleve [naproxen] Other (See Comments)   Azithromycin Other (See Comments)   Reaction not recalled   Penicillins Hives, Other (See Comments)   Bad reaction as a child after a shot of Penicillin   Pravastatin Other (See Comments)   Reaction not recalled   Sulfa Antibiotics Other (See Comments)   Sulfa Drugs Cross Reactors Other (See Comments)   Reaction not recalled- stated she can take it "in small doses"   Tape Itching, Other (See Comments)   Cannot wear for an extended period of time   Naproxen Sodium Rash        Medication List         Accurate as of January 02, 2023  2:33 PM. If you have any questions, ask your nurse or doctor.          STOP taking these medications    NUTRITIONAL DRINK PO Stopped by: Luberta Robertson Karuna Balducci       TAKE these medications    acetaminophen 500 MG tablet Commonly known as: TYLENOL Take 500 mg by mouth every 6 (six) hours as needed for mild pain or headache.   Align 4 MG Caps Take 4 mg by mouth daily.   b complex vitamins capsule Take 1 capsule by mouth daily.   dextromethorphan-guaiFENesin 10-100 MG/5ML liquid Commonly known as: ROBITUSSIN-DM Take 10 mLs by mouth every 6 (six) hours as needed for cough.   diclofenac Sodium 1 % Gel Commonly known as: VOLTAREN Apply 4 g topically 4 (four) times daily.   fluticasone 50 MCG/ACT nasal spray Commonly known as: FLONASE Place into both nostrils daily.   furosemide 20 MG tablet Commonly known as: LASIX Take 20 mg by mouth daily.   hydrALAZINE 50 MG tablet Commonly known as: APRESOLINE Take 50 mg by mouth 4 (four) times daily.   ipratropium-albuterol 0.5-2.5 (3) MG/3ML Soln Commonly known as: DUONEB Take 3 mLs by nebulization every 6 (six) hours as needed.   loperamide 2 MG tablet Commonly known as: IMODIUM A-D Take 2 mg by mouth every 4 (four) hours as needed for diarrhea or loose stools.   magnesium oxide 400 (240 Mg) MG tablet Commonly known as: MAG-OX Take 400 mg by mouth in the morning, at noon, and at bedtime.   MULTI-VITAMIN/IRON PO Take 1 tablet by mouth daily.   ondansetron 4 MG disintegrating tablet Commonly known as: ZOFRAN-ODT Take 4 mg by mouth every 4 (four) hours as needed for nausea or vomiting.   PHENobarbital 64.8 MG tablet Commonly known as: LUMINAL Take 1 tablet (64.8 mg total) by mouth daily. Take 1 tablet every morning   phenytoin 100 MG ER capsule Commonly known as: DILANTIN Take 200 mg by mouth daily.   potassium chloride 10 MEQ tablet Commonly known as: KLOR-CON M Take 1 tablet (10  mEq total) by mouth daily.   Systane 0.4-0.3 % Soln Generic drug: Polyethyl Glycol-Propyl Glycol Place 1 drop into both eyes in the morning and at bedtime.   valsartan 160 MG tablet Commonly known as: Diovan Take 1 tablet (160 mg total) by mouth 2 (two) times daily.        Review of Systems  Constitutional:  Negative for activity change, appetite change and fever.  HENT:  Positive for sore throat. Negative for congestion and trouble swallowing.   Respiratory:  Positive for cough. Negative for shortness of breath and wheezing.   Cardiovascular:  Negative for chest pain and leg  swelling.  Gastrointestinal:  Negative for nausea and vomiting.  Musculoskeletal:  Negative for myalgias.  Psychiatric/Behavioral:  Positive for confusion. Negative for dysphoric mood. The patient is not nervous/anxious.     Immunization History  Administered Date(s) Administered   Fluad Quad(high Dose 65+) 04/16/2022   Influenza Split 03/28/2009, 03/17/2010, 03/26/2011, 04/01/2012, 03/05/2013   Influenza, Quadrivalent, Recombinant, Inj, Pf 03/08/2018, 02/13/2019, 02/24/2020   Influenza,inj,Quad PF,6+ Mos 02/25/2014, 03/05/2015   Influenza-Unspecified 02/09/2014, 02/25/2016, 04/06/2017, 04/25/2021   Moderna Sars-Covid-2 Vaccination 07/17/2019, 08/14/2019, 02/24/2020, 04/26/2020   Pneumococcal Conjugate-13 11/26/2014   Pneumococcal-Unspecified 06/23/2003, 11/10/2012   Td (Adult), 2 Lf Tetanus Toxid, Preservative Free 06/11/2000, 11/06/2011   Unspecified SARS-COV-2 Vaccination 04/28/2021   Zoster Recombinant(Shingrix) 01/09/2019, 05/08/2019   Zoster, Live 01/15/2006, 01/15/2019   Pertinent  Health Maintenance Due  Topic Date Due   DEXA SCAN  Never done   INFLUENZA VACCINE  01/10/2023      05/01/2022    9:41 AM 06/26/2022    1:39 PM 07/19/2022   10:35 AM 07/30/2022    1:44 PM 10/08/2022    2:38 PM  Fall Risk  Falls in the past year? 0 0 1 1 0  Was there an injury with Fall? 0 1 0 0 0  Fall Risk  Category Calculator 0 1 2 1  0  Fall Risk Category (Retired) Low      (RETIRED) Patient Fall Risk Level High fall risk      Patient at Risk for Falls Due to Impaired balance/gait;Impaired mobility History of fall(s);Impaired balance/gait  History of fall(s) Impaired balance/gait;Impaired mobility;History of fall(s)  Fall risk Follow up Falls evaluation completed Falls evaluation completed Falls evaluation completed Falls evaluation completed    Functional Status Survey:    Vitals:   01/02/23 1407  BP: (!) 151/44  Pulse: 73  Resp: 16  Temp: 98.5 F (36.9 C)  SpO2: 94%  Weight: 120 lb (54.4 kg)  Height: 5' 6.5" (1.689 m)   Body mass index is 19.08 kg/m. Physical Exam Vitals reviewed.  Constitutional:      General: She is not in acute distress. HENT:     Head: Normocephalic.     Right Ear: There is no impacted cerumen.     Left Ear: There is no impacted cerumen.     Nose: Nose normal.     Mouth/Throat:     Mouth: Mucous membranes are moist.  Eyes:     General:        Right eye: No discharge.        Left eye: No discharge.  Cardiovascular:     Rate and Rhythm: Normal rate. Rhythm irregular.     Pulses: Normal pulses.     Heart sounds: Murmur heard.  Pulmonary:     Effort: Pulmonary effort is normal. No respiratory distress.     Breath sounds: Examination of the right-upper field reveals rhonchi. Examination of the left-upper field reveals rhonchi. Examination of the right-middle field reveals rales. Examination of the left-middle field reveals rales. Rhonchi and rales present. No wheezing.  Abdominal:     General: Bowel sounds are normal.     Palpations: Abdomen is soft.  Musculoskeletal:     Cervical back: Neck supple.     Right lower leg: Edema present.     Left lower leg: Edema present.     Comments: Non pitting  Lymphadenopathy:     Cervical: No cervical adenopathy.  Skin:    General: Skin is warm.  Neurological:     General:  No focal deficit present.      Mental Status: She is alert and oriented to person, place, and time.     Motor: Weakness present.     Gait: Gait abnormal.     Comments: walker  Psychiatric:        Mood and Affect: Mood normal.     Labs reviewed: Recent Labs    04/22/22 1826 04/23/22 0811 04/24/22 0504 04/25/22 0735 04/26/22 0901 08/28/22 1131 09/18/22 0000 12/20/22 1015 12/27/22 1154  NA  --    < > 136 135   < > 141 136* 138 139  K  --    < > 3.7 3.7   < > 5.6* 5.0 5.4* 4.9  CL  --    < > 103 103   < > 104 104 103 101  CO2  --    < > 22 19*   < > 20 19 21 22   GLUCOSE  --    < > 91 89   < > 95  --  87 98  BUN  --    < > 29* 29*   < > 38* 39* 34* 30*  CREATININE  --    < > 1.14* 1.32*   < > 1.20* 1.3* 1.22* 1.19*  CALCIUM 8.1*   < > 8.1* 8.4*   < > 9.7 9.6 9.9 10.1  MG 1.1*  --  1.8 1.9  --   --   --   --   --   PHOS  --   --  4.4 3.5  --   --   --   --   --    < > = values in this interval not displayed.   Recent Labs    04/24/22 0504 04/25/22 0735 04/26/22 0901 05/07/22 0000 06/01/22 0000 07/31/22 0000 09/18/22 0000  AST 24 21 18    < > 30 32 31  ALT 16 15 12    < > 25 23 27   ALKPHOS 52 53 54   < > 77 63 3.9*  BILITOT 0.7 1.1 0.9  --   --   --   --   PROT 5.8* 5.9* 5.9*  --   --   --   --   ALBUMIN 2.8* 2.8* 2.6*   < > 3.5 3.7 3.9   < > = values in this interval not displayed.   Recent Labs    04/24/22 0504 04/25/22 0735 04/26/22 0901 05/07/22 0000 05/29/22 0000 06/01/22 0000 09/18/22 0000  WBC 11.3* 8.9 5.7   < > 4.8 6.3 5.2  NEUTROABS 10.0* 7.6  --   --   --  5.20  --   HGB 9.1* 9.5* 10.2*   < > 9.7* 10.2* 10.0*  HCT 28.1* 29.3* 32.4*   < > 29* 31* 29*  MCV 104.5* 103.5* 106.2*  --   --   --   --   PLT 237 274 283   < > 221 254 243   < > = values in this interval not displayed.   Lab Results  Component Value Date   TSH 4.325 04/25/2022   Lab Results  Component Value Date   HGBA1C 4.9 04/22/2022   Lab Results  Component Value Date   CHOL 208 (H) 01/28/2022   HDL 80  01/28/2022   LDLCALC 116 (H) 01/28/2022   TRIG 59 01/28/2022   CHOLHDL 2.6 01/28/2022    Significant Diagnostic Results in last 30 days:  No results found.  Assessment/Plan 1. Acute cough - 07/22 sore throat x 1 day - 07/23 cough, sometimes productive - rhonchi and rales noted on exam - CXR r/o effusion, PNA - doxycycline (VIBRA-TABS) 100 MG tablet; Take 1 tablet (100 mg total) by mouth 2 (two) times daily for 5 days. - guaiFENesin (MUCINEX) 600 MG 12 hr tablet; Take 1 tablet (600 mg total) by mouth 2 (two) times daily for 7 days.    Family/ staff Communication: plan discussed with patient and nurse  Labs/tests ordered:   CXR

## 2023-01-03 ENCOUNTER — Telehealth: Payer: Self-pay | Admitting: Adult Health

## 2023-01-03 DIAGNOSIS — E871 Hypo-osmolality and hyponatremia: Secondary | ICD-10-CM | POA: Diagnosis not present

## 2023-01-03 NOTE — Telephone Encounter (Signed)
01/03/23: CXR returned showing moderate CHF and a left pleural effusion.  Will keep doxycycline and increase lasix to 40 mg every day for 3 days and Kdur 20 meq every day for three days.

## 2023-01-07 ENCOUNTER — Non-Acute Institutional Stay: Payer: Medicare Other | Admitting: Internal Medicine

## 2023-01-07 ENCOUNTER — Encounter: Payer: Medicare Other | Admitting: Adult Health

## 2023-01-07 DIAGNOSIS — R531 Weakness: Secondary | ICD-10-CM | POA: Diagnosis not present

## 2023-01-07 DIAGNOSIS — R197 Diarrhea, unspecified: Secondary | ICD-10-CM | POA: Diagnosis not present

## 2023-01-07 DIAGNOSIS — R634 Abnormal weight loss: Secondary | ICD-10-CM | POA: Diagnosis not present

## 2023-01-07 LAB — BASIC METABOLIC PANEL
BUN: 40 — AB (ref 4–21)
CO2: 23 — AB (ref 13–22)
Chloride: 101 (ref 99–108)
Chloride: 101 (ref 99–108)
Creatinine: 1.1 (ref 0.5–1.1)
Glucose: 103
Potassium: 4.8 mEq/L (ref 3.5–5.1)
Potassium: 4.8 mEq/L (ref 3.5–5.1)
Sodium: 133 — AB (ref 137–147)

## 2023-01-07 LAB — COMPREHENSIVE METABOLIC PANEL
Calcium: 8.7 (ref 8.7–10.7)
eGFR: 48

## 2023-01-08 ENCOUNTER — Encounter: Payer: Self-pay | Admitting: Internal Medicine

## 2023-01-08 ENCOUNTER — Ambulatory Visit: Payer: Medicare Other | Admitting: Cardiology

## 2023-01-08 ENCOUNTER — Encounter: Payer: Medicare Other | Admitting: Internal Medicine

## 2023-01-08 ENCOUNTER — Telehealth: Payer: Self-pay | Admitting: Orthopedic Surgery

## 2023-01-08 DIAGNOSIS — I10 Essential (primary) hypertension: Secondary | ICD-10-CM | POA: Diagnosis not present

## 2023-01-08 DIAGNOSIS — I5022 Chronic systolic (congestive) heart failure: Secondary | ICD-10-CM | POA: Diagnosis not present

## 2023-01-08 DIAGNOSIS — R197 Diarrhea, unspecified: Secondary | ICD-10-CM | POA: Diagnosis not present

## 2023-01-08 DIAGNOSIS — R531 Weakness: Secondary | ICD-10-CM | POA: Diagnosis not present

## 2023-01-08 LAB — CBC AND DIFFERENTIAL
HCT: 29 — AB (ref 36–46)
Hemoglobin: 10.5 — AB (ref 12.0–16.0)
Platelets: 223 10*3/uL (ref 150–400)
WBC: 4.9

## 2023-01-08 LAB — CBC: RBC: 2.93 — AB (ref 3.87–5.11)

## 2023-01-08 NOTE — Telephone Encounter (Signed)
07/30 lab work: BUN/creat 42.2/1.13, Na+ 133, albumin 3.4, BNP 186, other labs unremarkable.

## 2023-01-08 NOTE — Progress Notes (Signed)
Location: [facility] Service:  ALF (13)  Provider:   Code Status: DNR Goals of Care:     01/02/2023    2:17 PM  Advanced Directives  Does Patient Have a Medical Advance Directive? Yes  Type of Advance Directive Out of facility DNR (pink MOST or yellow form)  Does patient want to make changes to medical advance directive? No - Patient declined  Pre-existing out of facility DNR order (yellow form or pink MOST form) Yellow form placed in chart (order not valid for inpatient use)     Chief Complaint  Patient presents with   Acute Visit    HPI: Patient is a 87 y.o. female seen today for an acute visit for Weakness and Diarrhea  She is in AL in [place] Acute issue Seen for cough productive on 01/02/23 Started on Doxycyline Chest Xray showed moderate CHF and Left pleural effusion So incrase her lasix 40 mg for 3 days Since then she has been feeling weak and lost weight  Wt Readings from Last 3 Encounters:  01/08/23 112 lb (50.8 kg)  01/02/23 120 lb (54.4 kg)  12/27/22 119 lb 3.2 oz (54.1 kg)   Not eating also having loose stools mostly during the daytime not at night  Today is her last day of Doxycyline Cough and SOB is much better No chest pain or fever  Also has h/o  Has history of chronic diarrhea and Previous h/o C Diff colitis S/p PPM placement on 05/30 for CHB Chest pain in 08/19 Diagnosed with Pericarditis with Pericardial Effusion   A Fib Amiodarone tried but then taken off due to GI side effects Not on anticoagulation due to cerebral AVMs.  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 teen Had a work-up at [facility] and has been on Dilantin since then History of unhealed right humerus shaft fracture Peripheral neuropathy laparoscopic cholecystectomy and possibly postprocedure seizure  Past Medical History:  Diagnosis Date   Abnormal cardiovascular stress  test    Carotid arterial disease (HCC)    MODERATE RIGHT   Cerebral AV malformation    DDD (degenerative disc disease)    SEVERE WITH SPINAL STENOSIS   Hypercholesterolemia    Hypertension    MVP (mitral valve prolapse)    MILD   Numbness    LEFT FOOT AND ANKLE   Osteopenia    Presence of permanent cardiac pacemaker    Pseudo-gout     Past Surgical History:  Procedure Laterality Date   APPENDECTOMY     BREAST MASS EXCISION     CARPAL TUNNEL RELEASE     CHOLECYSTECTOMY N/A 04/23/2022   Procedure: LAPAROSCOPIC CHOLECYSTECTOMY;  Surgeon: Abigail Miyamoto, MD;  Location: [facility];  Service: General;  Laterality: N/A;   gallbladder     PACEMAKER IMPLANT N/A 11/07/2021   Procedure: PACEMAKER IMPLANT;  Surgeon: Marinus Maw, MD;  Location: [facility];  Service: Cardiovascular;  Laterality: N/A;   REMOVAL OF PARATHYROID GLAND     STRESS NUCLEAR STUDY     ABNORMAL   TONSILLECTOMY      Allergies  Allergen Reactions   Aleve [Naproxen] Other (See Comments)   Azithromycin Other (See Comments)    Reaction not recalled   Penicillins Hives and Other (See Comments)    Bad reaction as a child after a shot of Penicillin   Pravastatin Other (See Comments)    Reaction  not recalled   Sulfa Antibiotics Other (See Comments)   Sulfa Drugs Cross Reactors Other (See Comments)    Reaction not recalled- stated she can take it "in small doses"   Tape Itching and Other (See Comments)    Cannot wear for an extended period of time   Naproxen Sodium Rash    Outpatient Encounter Medications as of 01/07/2023  Medication Sig   acetaminophen (TYLENOL) 500 MG tablet Take 500 mg by mouth every 6 (six) hours as needed for mild pain or headache.   b complex vitamins capsule Take 1 capsule by mouth daily.   dextromethorphan-guaiFENesin (ROBITUSSIN-DM) 10-100 MG/5ML liquid Take 10 mLs by mouth every 6 (six) hours as needed for cough.   diclofenac Sodium (VOLTAREN) 1 % GEL Apply 4 g topically 4  (four) times daily.   [EXPIRED] doxycycline (VIBRA-TABS) 100 MG tablet Take 1 tablet (100 mg total) by mouth 2 (two) times daily for 5 days.   fluticasone (FLONASE) 50 MCG/ACT nasal spray Place into both nostrils daily.   furosemide (LASIX) 20 MG tablet Take 20 mg by mouth daily.   guaiFENesin (MUCINEX) 600 MG 12 hr tablet Take 1 tablet (600 mg total) by mouth 2 (two) times daily for 7 days.   hydrALAZINE (APRESOLINE) 50 MG tablet Take 50 mg by mouth 4 (four) times daily.   ipratropium-albuterol (DUONEB) 0.5-2.5 (3) MG/3ML SOLN Take 3 mLs by nebulization every 6 (six) hours as needed.   loperamide (IMODIUM A-D) 2 MG tablet Take 2 mg by mouth every 4 (four) hours as needed for diarrhea or loose stools.   magnesium oxide (MAG-OX) 400 (240 Mg) MG tablet Take 400 mg by mouth in the morning, at noon, and at bedtime.   Multiple Vitamins-Iron (MULTI-VITAMIN/IRON PO) Take 1 tablet by mouth daily.   ondansetron (ZOFRAN-ODT) 4 MG disintegrating tablet Take 4 mg by mouth every 4 (four) hours as needed for nausea or vomiting.   PHENobarbital (LUMINAL) 64.8 MG tablet Take 1 tablet (64.8 mg total) by mouth daily. Take 1 tablet every morning   phenytoin (DILANTIN) 100 MG ER capsule Take 200 mg by mouth daily.   Polyethyl Glycol-Propyl Glycol (SYSTANE) 0.4-0.3 % SOLN Place 1 drop into both eyes in the morning and at bedtime.   potassium chloride (KLOR-CON M) 10 MEQ tablet Take 1 tablet (10 mEq total) by mouth daily.   Probiotic Product (ALIGN) 4 MG CAPS Take 4 mg by mouth daily.   valsartan (DIOVAN) 160 MG tablet Take 1 tablet (160 mg total) by mouth 2 (two) times daily.   No facility-administered encounter medications on file as of 01/07/2023.    Review of Systems:  Review of Systems  Constitutional:  Positive for activity change, appetite change and unexpected weight change.  HENT: Negative.    Respiratory:  Positive for cough. Negative for shortness of breath.   Cardiovascular:  Negative for leg  swelling.  Gastrointestinal:  Positive for diarrhea. Negative for constipation.  Genitourinary: Negative.   Musculoskeletal:  Positive for gait problem. Negative for arthralgias and myalgias.  Skin: Negative.   Neurological:  Positive for weakness. Negative for dizziness.  Psychiatric/Behavioral:  Negative for confusion, dysphoric mood and sleep disturbance.     Health Maintenance  Topic Date Due   DEXA SCAN  Never done   COVID-19 Vaccine (6 - 2023-24 season) 02/09/2022   Pneumonia Vaccine 84+ Years old (2 of 2 - PPSV23 or PCV20) 06/27/2023 (Originally 11/26/2015)   INFLUENZA VACCINE  01/10/2023   Medicare Annual Wellness (AWV)  03/20/2023   Zoster Vaccines- Shingrix  Completed   HPV VACCINES  Aged Out   DTaP/Tdap/Td  Discontinued    Physical Exam: Vitals:   01/08/23 2214  BP: (!) 138/55  Pulse: 66  Resp: 18  Temp: (!) 97.4 F (36.3 C)  Weight: 112 lb (50.8 kg)   Body mass index is 17.81 kg/m. Physical Exam Vitals reviewed.  Constitutional:      Appearance: Normal appearance.  HENT:     Head: Normocephalic.     Nose: Nose normal.     Mouth/Throat:     Mouth: Mucous membranes are moist.     Pharynx: Oropharynx is clear.  Eyes:     Pupils: Pupils are equal, round, and reactive to light.  Cardiovascular:     Rate and Rhythm: Normal rate and regular rhythm.     Pulses: Normal pulses.     Heart sounds: Normal heart sounds. No murmur heard. Pulmonary:     Effort: Pulmonary effort is normal.     Breath sounds: Normal breath sounds.  Abdominal:     General: Abdomen is flat. Bowel sounds are normal.     Palpations: Abdomen is soft.  Musculoskeletal:        General: No swelling.     Cervical back: Neck supple.  Skin:    General: Skin is warm.  Neurological:     General: No focal deficit present.     Mental Status: She is alert and oriented to person, place, and time.  Psychiatric:        Mood and Affect: Mood normal.        Thought Content: Thought content  normal.     Labs reviewed: Basic Metabolic Panel: Recent Labs    01/27/22 1116 01/28/22 0331 04/22/22 1826 04/23/22 0811 04/24/22 0504 04/25/22 0735 04/26/22 0901 08/28/22 1131 09/18/22 0000 12/20/22 1015 12/27/22 1154  NA  --    < >  --    < > 136 135   < > 141 136* 138 139  K  --    < >  --    < > 3.7 3.7   < > 5.6* 5.0 5.4* 4.9  CL  --    < >  --    < > 103 103   < > 104 104 103 101  CO2  --    < >  --    < > 22 19*   < > 20 19 21 22   GLUCOSE  --    < >  --    < > 91 89   < > 95  --  87 98  BUN  --    < >  --    < > 29* 29*   < > 38* 39* 34* 30*  CREATININE  --    < >  --    < > 1.14* 1.32*   < > 1.20* 1.3* 1.22* 1.19*  CALCIUM  --    < > 8.1*   < > 8.1* 8.4*   < > 9.7 9.6 9.9 10.1  MG 1.7  --  1.1*  --  1.8 1.9  --   --   --   --   --   PHOS  --   --   --   --  4.4 3.5  --   --   --   --   --   TSH 1.914  --   --   --   --  4.325  --   --   --   --   --    < > = values in this interval not displayed.   Liver Function Tests: Recent Labs    04/24/22 0504 04/25/22 0735 04/26/22 0901 05/07/22 0000 06/01/22 0000 07/31/22 0000 09/18/22 0000  AST 24 21 18    < > 30 32 31  ALT 16 15 12    < > 25 23 27   ALKPHOS 52 53 54   < > 77 63 3.9*  BILITOT 0.7 1.1 0.9  --   --   --   --   PROT 5.8* 5.9* 5.9*  --   --   --   --   ALBUMIN 2.8* 2.8* 2.6*   < > 3.5 3.7 3.9   < > = values in this interval not displayed.   Recent Labs    01/27/22 0830 04/22/22 0434  LIPASE 53* 42   Recent Labs    04/26/22 0901  AMMONIA 18   CBC: Recent Labs    04/24/22 0504 04/25/22 0735 04/26/22 0901 05/07/22 0000 05/29/22 0000 06/01/22 0000 09/18/22 0000  WBC 11.3* 8.9 5.7   < > 4.8 6.3 5.2  NEUTROABS 10.0* 7.6  --   --   --  5.20  --   HGB 9.1* 9.5* 10.2*   < > 9.7* 10.2* 10.0*  HCT 28.1* 29.3* 32.4*   < > 29* 31* 29*  MCV 104.5* 103.5* 106.2*  --   --   --   --   PLT 237 274 283   < > 221 254 243   < > = values in this interval not displayed.   Lipid Panel: Recent Labs     01/28/22 1254  CHOL 208*  HDL 80  LDLCALC 116*  TRIG 59  CHOLHDL 2.6   Lab Results  Component Value Date   HGBA1C 4.9 04/22/2022    Procedures since last visit: No results found.  Assessment/Plan 1. Weakness Will Check BMP and CBC  2. Diarrhea, unspecified type Most likely due to Doxycyline Try Imodium If diarrhea persists will check for C Diff  3. Weight loss ? Due to diuresis  Will Follow in next few days  4 CHF with Left Pleural effusion Will repeat Chest Xray She did have this in [facility] in 03/24    Labs/tests ordered:  BMP,CBC,BNP and repeat Chest Xray Next appt:

## 2023-01-09 ENCOUNTER — Encounter: Payer: Self-pay | Admitting: Orthopedic Surgery

## 2023-01-09 ENCOUNTER — Non-Acute Institutional Stay: Payer: Medicare Other | Admitting: Orthopedic Surgery

## 2023-01-09 DIAGNOSIS — J90 Pleural effusion, not elsewhere classified: Secondary | ICD-10-CM | POA: Diagnosis not present

## 2023-01-09 DIAGNOSIS — I272 Pulmonary hypertension, unspecified: Secondary | ICD-10-CM | POA: Diagnosis not present

## 2023-01-09 DIAGNOSIS — R197 Diarrhea, unspecified: Secondary | ICD-10-CM

## 2023-01-09 DIAGNOSIS — I501 Left ventricular failure: Secondary | ICD-10-CM | POA: Diagnosis not present

## 2023-01-09 DIAGNOSIS — I517 Cardiomegaly: Secondary | ICD-10-CM | POA: Diagnosis not present

## 2023-01-09 DIAGNOSIS — E871 Hypo-osmolality and hyponatremia: Secondary | ICD-10-CM | POA: Diagnosis not present

## 2023-01-09 DIAGNOSIS — Z950 Presence of cardiac pacemaker: Secondary | ICD-10-CM | POA: Diagnosis not present

## 2023-01-09 NOTE — Progress Notes (Signed)
Location:  [facility] Room Number: 626/A Place of Service:  ALF [phone]) Provider:  Octavia Heir, NP   Mahlon Gammon, MD  Patient Care Team: Mahlon Gammon, MD as PCP - General (Internal Medicine) [place], [name], MD as PCP - Cardiology (Cardiology) Van Clines, MD as Consulting Physician (Neurology)  Extended Emergency Contact Information Primary Emergency Contact: [name],[name] Mobile Phone: [phone] Relation: Daughter Secondary Emergency Contact: JANIECE, SCOVILL Mobile Phone: [phone] Relation: Son  Code Status:  DNR Goals of care: Advanced Directive information    01/02/2023    2:17 PM  Advanced Directives  Does Patient Have a Medical Advance Directive? Yes  Type of Advance Directive Out of facility DNR (pink MOST or yellow form)  Does patient want to make changes to medical advance directive? No - Patient declined  Pre-existing out of facility DNR order (yellow form or pink MOST form) Yellow form placed in chart (order not valid for inpatient use)     Chief Complaint  Patient presents with   Acute Visit    Left pleural effusion    HPI:  Pt is a 87 y.o. female seen today for acute visit due to left pleural effusion.   She currently resides on the assisted living unit at [facility]. PMH: aortic atherosclerosis, HTN, MVP, PAF, CHB s/p pacemaker 05/30, neuropathy, encephalopathy, epilepsy, iron deficiency anemia and low back pain.   07/22 she was seen for acute productive cough. CXR revealed moderate CHF and left pleural effusion. She was started on doxycycline. Furosemide was increased to 40 mg x 3 days. 07/30 repeat CXR indicated repeat left pleural effusion. She continues to have a dry cough. Completed doxycycline. Afebrile. Vitals stable.   Diarrhea x 1 week. It began when doxycycline was started. She denies abdominal pain. H/o c.diff colitis. She has not taken imodium. Requesting Florastor.       Past Medical History:   Diagnosis Date   Abnormal cardiovascular stress test    Carotid arterial disease (HCC)    MODERATE RIGHT   Cerebral AV malformation    DDD (degenerative disc disease)    SEVERE WITH SPINAL STENOSIS   Hypercholesterolemia    Hypertension    MVP (mitral valve prolapse)    MILD   Numbness    LEFT FOOT AND ANKLE   Osteopenia    Presence of permanent cardiac pacemaker    Pseudo-gout    Past Surgical History:  Procedure Laterality Date   APPENDECTOMY     BREAST MASS EXCISION     CARPAL TUNNEL RELEASE     CHOLECYSTECTOMY N/A 04/23/2022   Procedure: LAPAROSCOPIC CHOLECYSTECTOMY;  Surgeon: Abigail Miyamoto, MD;  Location: [facility];  Service: General;  Laterality: N/A;   gallbladder     PACEMAKER IMPLANT N/A 11/07/2021   Procedure: PACEMAKER IMPLANT;  Surgeon: Marinus Maw, MD;  Location: [facility];  Service: Cardiovascular;  Laterality: N/A;   REMOVAL OF PARATHYROID GLAND     STRESS NUCLEAR STUDY     ABNORMAL   TONSILLECTOMY      Allergies  Allergen Reactions   Aleve [Naproxen] Other (See Comments)   Azithromycin Other (See Comments)    Reaction not recalled   Penicillins Hives and Other (See Comments)    Bad reaction as a child after a shot of Penicillin   Pravastatin Other (See Comments)    Reaction not recalled   Sulfa Antibiotics Other (See Comments)   Sulfa Drugs Cross Reactors Other (See Comments)  Reaction not recalled- stated she can take it "in small doses"   Tape Itching and Other (See Comments)    Cannot wear for an extended period of time   Naproxen Sodium Rash    Outpatient Encounter Medications as of 01/09/2023  Medication Sig   acetaminophen (TYLENOL) 500 MG tablet Take 500 mg by mouth every 6 (six) hours as needed for mild pain or headache.   b complex vitamins capsule Take 1 capsule by mouth daily.   dextromethorphan-guaiFENesin (ROBITUSSIN-DM) 10-100 MG/5ML liquid Take 10 mLs by mouth every 6 (six) hours as needed for cough.   diclofenac  Sodium (VOLTAREN) 1 % GEL Apply 4 g topically 4 (four) times daily.   fluticasone (FLONASE) 50 MCG/ACT nasal spray Place into both nostrils daily.   furosemide (LASIX) 20 MG tablet Take 20 mg by mouth daily.   guaiFENesin (MUCINEX) 600 MG 12 hr tablet Take 1 tablet (600 mg total) by mouth 2 (two) times daily for 7 days.   hydrALAZINE (APRESOLINE) 50 MG tablet Take 50 mg by mouth 4 (four) times daily.   ipratropium-albuterol (DUONEB) 0.5-2.5 (3) MG/3ML SOLN Take 3 mLs by nebulization every 6 (six) hours as needed.   loperamide (IMODIUM A-D) 2 MG tablet Take 2 mg by mouth every 4 (four) hours as needed for diarrhea or loose stools.   magnesium oxide (MAG-OX) 400 (240 Mg) MG tablet Take 400 mg by mouth in the morning, at noon, and at bedtime.   Multiple Vitamins-Iron (MULTI-VITAMIN/IRON PO) Take 1 tablet by mouth daily.   ondansetron (ZOFRAN-ODT) 4 MG disintegrating tablet Take 4 mg by mouth every 4 (four) hours as needed for nausea or vomiting.   PHENobarbital (LUMINAL) 64.8 MG tablet Take 1 tablet (64.8 mg total) by mouth daily. Take 1 tablet every morning   phenytoin (DILANTIN) 100 MG ER capsule Take 200 mg by mouth daily.   Polyethyl Glycol-Propyl Glycol (SYSTANE) 0.4-0.3 % SOLN Place 1 drop into both eyes in the morning and at bedtime.   potassium chloride (KLOR-CON M) 10 MEQ tablet Take 1 tablet (10 mEq total) by mouth daily.   Probiotic Product (ALIGN) 4 MG CAPS Take 4 mg by mouth daily.   valsartan (DIOVAN) 160 MG tablet Take 1 tablet (160 mg total) by mouth 2 (two) times daily.   No facility-administered encounter medications on file as of 01/09/2023.    Review of Systems  Constitutional:  Negative for activity change and fever.  HENT:  Negative for congestion and sore throat.   Respiratory:  Positive for cough. Negative for shortness of breath and wheezing.   Cardiovascular:  Positive for leg swelling. Negative for chest pain.  Gastrointestinal:  Positive for diarrhea. Negative for  abdominal distention, abdominal pain, constipation, nausea and vomiting.  Genitourinary:  Negative for dysuria.  Musculoskeletal:  Positive for gait problem. Negative for myalgias.  Skin:  Negative for rash.  Neurological:  Positive for weakness. Negative for dizziness and headaches.  Psychiatric/Behavioral:  Negative for confusion and dysphoric mood. The patient is not nervous/anxious.     Immunization History  Administered Date(s) Administered   Fluad Quad(high Dose 65+) 04/16/2022   Influenza Split 03/28/2009, 03/17/2010, 03/26/2011, 04/01/2012, 03/05/2013   Influenza, Quadrivalent, Recombinant, Inj, Pf 03/08/2018, 02/13/2019, 02/24/2020   Influenza,inj,Quad PF,6+ Mos 02/25/2014, 03/05/2015   Influenza-Unspecified 02/09/2014, 02/25/2016, 04/06/2017, 04/25/2021   Moderna Sars-Covid-2 Vaccination 07/17/2019, 08/14/2019, 02/24/2020, 04/26/2020   Pneumococcal Conjugate-13 11/26/2014   Pneumococcal-Unspecified 06/23/2003, 11/10/2012   Td (Adult), 2 Lf Tetanus Toxid, Preservative Free 06/11/2000, 11/06/2011  Unspecified SARS-COV-2 Vaccination 04/28/2021   Zoster Recombinant(Shingrix) 01/09/2019, 05/08/2019   Zoster, Live 01/15/2006, 01/15/2019   Pertinent  Health Maintenance Due  Topic Date Due   DEXA SCAN  Never done   INFLUENZA VACCINE  01/10/2023      05/01/2022    9:41 AM 06/26/2022    1:39 PM 07/19/2022   10:35 AM 07/30/2022    1:44 PM 10/08/2022    2:38 PM  Fall Risk  Falls in the past year? 0 0 1 1 0  Was there an injury with Fall? 0 1 0 0 0  Fall Risk Category Calculator 0 1 2 1  0  Fall Risk Category (Retired) Low      (RETIRED) Patient Fall Risk Level High fall risk      Patient at Risk for Falls Due to Impaired balance/gait;Impaired mobility History of fall(s);Impaired balance/gait  History of fall(s) Impaired balance/gait;Impaired mobility;History of fall(s)  Fall risk Follow up Falls evaluation completed Falls evaluation completed Falls evaluation completed Falls  evaluation completed    Functional Status Survey:    Vitals:   01/09/23 1459  BP: (!) 122/58  Pulse: 66  Resp: 18  Temp: (!) 97.4 F (36.3 C)  SpO2: 98%  Weight: 109 lb 9.6 oz (49.7 kg)  Height: 5\' 5"  (1.651 m)   Body mass index is 18.24 kg/m. Physical Exam Vitals reviewed.  Constitutional:      General: She is not in acute distress. HENT:     Head: Normocephalic.     Nose: Nose normal.     Mouth/Throat:     Mouth: Mucous membranes are moist.  Eyes:     General:        Right eye: No discharge.        Left eye: No discharge.  Cardiovascular:     Rate and Rhythm: Normal rate and regular rhythm.     Pulses: Normal pulses.     Heart sounds: Normal heart sounds.  Pulmonary:     Effort: Pulmonary effort is normal. No respiratory distress.     Breath sounds: Examination of the left-middle field reveals rales. Rales present. No wheezing or rhonchi.  Abdominal:     General: Bowel sounds are normal. There is no distension.     Palpations: Abdomen is soft. There is no mass.     Tenderness: There is no abdominal tenderness. There is no guarding or rebound.     Hernia: No hernia is present.  Musculoskeletal:     Cervical back: Neck supple.     Right lower leg: Edema present.     Left lower leg: Edema present.     Comments: 1+ pitting  Lymphadenopathy:     Cervical: No cervical adenopathy.  Skin:    General: Skin is warm.     Capillary Refill: Capillary refill takes less than 2 seconds.  Neurological:     General: No focal deficit present.     Mental Status: She is alert and oriented to person, place, and time.     Motor: Weakness present.     Gait: Gait abnormal.     Comments: walker  Psychiatric:        Mood and Affect: Mood normal.     Labs reviewed: Recent Labs    04/22/22 1826 04/23/22 0811 04/24/22 0504 04/25/22 0735 04/26/22 0901 08/28/22 1131 09/18/22 0000 12/20/22 1015 12/27/22 1154  NA  --    < > 136 135   < > 141 136* 138 139  K  --    < >  3.7  3.7   < > 5.6* 5.0 5.4* 4.9  CL  --    < > 103 103   < > 104 104 103 101  CO2  --    < > 22 19*   < > 20 19 21 22   GLUCOSE  --    < > 91 89   < > 95  --  87 98  BUN  --    < > 29* 29*   < > 38* 39* 34* 30*  CREATININE  --    < > 1.14* 1.32*   < > 1.20* 1.3* 1.22* 1.19*  CALCIUM 8.1*   < > 8.1* 8.4*   < > 9.7 9.6 9.9 10.1  MG 1.1*  --  1.8 1.9  --   --   --   --   --   PHOS  --   --  4.4 3.5  --   --   --   --   --    < > = values in this interval not displayed.   Recent Labs    04/24/22 0504 04/25/22 0735 04/26/22 0901 05/07/22 0000 06/01/22 0000 07/31/22 0000 09/18/22 0000  AST 24 21 18    < > 30 32 31  ALT 16 15 12    < > 25 23 27   ALKPHOS 52 53 54   < > 77 63 3.9*  BILITOT 0.7 1.1 0.9  --   --   --   --   PROT 5.8* 5.9* 5.9*  --   --   --   --   ALBUMIN 2.8* 2.8* 2.6*   < > 3.5 3.7 3.9   < > = values in this interval not displayed.   Recent Labs    04/24/22 0504 04/25/22 0735 04/26/22 0901 05/07/22 0000 05/29/22 0000 06/01/22 0000 09/18/22 0000  WBC 11.3* 8.9 5.7   < > 4.8 6.3 5.2  NEUTROABS 10.0* 7.6  --   --   --  5.20  --   HGB 9.1* 9.5* 10.2*   < > 9.7* 10.2* 10.0*  HCT 28.1* 29.3* 32.4*   < > 29* 31* 29*  MCV 104.5* 103.5* 106.2*  --   --   --   --   PLT 237 274 283   < > 221 254 243   < > = values in this interval not displayed.   Lab Results  Component Value Date   TSH 4.325 04/25/2022   Lab Results  Component Value Date   HGBA1C 4.9 04/22/2022   Lab Results  Component Value Date   CHOL 208 (H) 01/28/2022   HDL 80 01/28/2022   LDLCALC 116 (H) 01/28/2022   TRIG 59 01/28/2022   CHOLHDL 2.6 01/28/2022    Significant Diagnostic Results in last 30 days:  No results found.  Assessment/Plan 1. Pleural effusion, left - ongoing> 07/30 repeat CXR noted left pleural effusion - now having dry cough - will give extra furosemide 40 mg po daily x 2 days - furosemide (LASIX) 40 MG tablet; Take 1 tablet (40 mg total) by mouth daily for 2 days. - potassium  chloride SA (KLOR-CON M) 20 MEQ tablet; Take 1 tablet (20 mEq total) by mouth daily for 2 days.  2. Hyponatremia - Na+ 133 - asymptomatic - drops with diuretics - repeat bmp 08/06  3. Diarrhea, unspecified type - began with doxycycline - h/o c.diff colitis - exam unremarkable - denies abdominal pain - advised to take imodium  prn - will give probiotic - if not improvement> stool pcr - saccharomyces boulardii (FLORASTOR) 250 MG capsule; Take 1 capsule (250 mg total) by mouth 2 (two) times daily for 7 days.    Family/ staff Communication: plan discussed with patient and nurse  Labs/tests ordered:  none

## 2023-01-10 MED ORDER — SACCHAROMYCES BOULARDII 250 MG PO CAPS: 250.0000 mg | ORAL_CAPSULE | Freq: Two times a day (BID) | ORAL | Status: AC

## 2023-01-10 MED ORDER — FUROSEMIDE 40 MG PO TABS: 40.0000 mg | ORAL_TABLET | Freq: Every day | ORAL | Status: DC

## 2023-01-10 MED ORDER — POTASSIUM CHLORIDE CRYS ER 20 MEQ PO TBCR: 20.0000 meq | EXTENDED_RELEASE_TABLET | Freq: Every day | ORAL | Status: DC

## 2023-01-15 DIAGNOSIS — J90 Pleural effusion, not elsewhere classified: Secondary | ICD-10-CM | POA: Diagnosis not present

## 2023-01-25 DIAGNOSIS — E871 Hypo-osmolality and hyponatremia: Secondary | ICD-10-CM | POA: Diagnosis not present

## 2023-02-05 NOTE — Progress Notes (Signed)
This encounter was created in error - please disregard.

## 2023-02-06 ENCOUNTER — Ambulatory Visit (INDEPENDENT_AMBULATORY_CARE_PROVIDER_SITE_OTHER): Payer: Medicare Other

## 2023-02-06 ENCOUNTER — Encounter: Payer: Self-pay | Admitting: Orthopedic Surgery

## 2023-02-06 ENCOUNTER — Non-Acute Institutional Stay: Payer: Medicare Other | Admitting: Orthopedic Surgery

## 2023-02-06 DIAGNOSIS — I442 Atrioventricular block, complete: Secondary | ICD-10-CM | POA: Diagnosis not present

## 2023-02-06 DIAGNOSIS — I5032 Chronic diastolic (congestive) heart failure: Secondary | ICD-10-CM | POA: Diagnosis not present

## 2023-02-06 LAB — CUP PACEART REMOTE DEVICE CHECK
Battery Remaining Longevity: 127 mo
Battery Voltage: 3.03 V
Brady Statistic AP VP Percent: 62.72 %
Brady Statistic AP VS Percent: 0.2 %
Brady Statistic AS VP Percent: 28.65 %
Brady Statistic AS VS Percent: 8.49 %
Brady Statistic RA Percent Paced: 51.47 %
Brady Statistic RV Percent Paced: 91.35 %
Date Time Interrogation Session: 20240827221400
Implantable Lead Connection Status: 753985
Implantable Lead Connection Status: 753985
Implantable Lead Implant Date: 20230530
Implantable Lead Implant Date: 20230530
Implantable Lead Location: 753859
Implantable Lead Location: 753860
Implantable Lead Model: 3830
Implantable Lead Model: 5076
Implantable Pulse Generator Implant Date: 20230530
Lead Channel Impedance Value: 171 Ohm
Lead Channel Impedance Value: 285 Ohm
Lead Channel Impedance Value: 380 Ohm
Lead Channel Impedance Value: 418 Ohm
Lead Channel Pacing Threshold Amplitude: 1.125 V
Lead Channel Pacing Threshold Amplitude: 1.375 V
Lead Channel Pacing Threshold Pulse Width: 0.4 ms
Lead Channel Pacing Threshold Pulse Width: 0.4 ms
Lead Channel Sensing Intrinsic Amplitude: 0.125 mV
Lead Channel Sensing Intrinsic Amplitude: 0.125 mV
Lead Channel Sensing Intrinsic Amplitude: 8 mV
Lead Channel Sensing Intrinsic Amplitude: 8 mV
Lead Channel Setting Pacing Amplitude: 2 V
Lead Channel Setting Pacing Amplitude: 2.25 V
Lead Channel Setting Pacing Pulse Width: 0.4 ms
Lead Channel Setting Sensing Sensitivity: 1.2 mV
Zone Setting Status: 755011

## 2023-02-06 NOTE — Progress Notes (Signed)
Location:  [facility] Room Number: [id number] Place of Service:  ALF [phone]) Provider:  Hazle Nordmann ,NP  Mahlon Gammon, MD  Patient Care Team: Mahlon Gammon, MD as PCP - General (Internal Medicine) [place], [name], MD as PCP - Cardiology (Cardiology) Van Clines, MD as Consulting Physician (Neurology)  Extended Emergency Contact Information Primary Emergency Contact: [name],[name] Mobile Phone: [phone] Relation: Daughter Secondary Emergency Contact: DAPHENE, BELAIR Mobile Phone: [phone] Relation: Son  Code Status:  DNR Goals of care: Advanced Directive information    02/06/2023    2:47 PM  Advanced Directives  Does Patient Have a Medical Advance Directive? Yes  Type of Advance Directive Living will;Out of facility DNR (pink MOST or yellow form)  Does patient want to make changes to medical advance directive? No - Patient declined     Chief Complaint  Patient presents with   Acute Visit    Patient is being seen for Edema and weight gain.   Immunizations    Patient is still due for Covid and Flu Vaccine.   Health Maintenance    Due for AWV, Bone density    HPI:  Pt is a 87 y.o. female seen today for an acute visit due to weight gain and lower leg edema.   She currently resides on the assisted living unit at [facility]. PMH: aortic atherosclerosis, HTN, MVP, PAF, CHB s/p pacemaker 05/30, neuropathy, encephalopathy, epilepsy, iron deficiency anemia and low back pain.   H/o CHF and left pleural effusion. 07/2022 echocardiogram LVEF 60-65%. She is taking furosemide 20 mg daily. Nursing reports increasing pitting edema to lower extremities. She has also gained 20 lbs within last 3 weeks. See weights below. Blood pressure 174/70 this morning. She denies shortness of breath or acute cough. Afebrile. Vitals stable.   Recent weights:  08/28- 132.8 lbs  08/24- 131.6 lbs  08/16- 127 lbs  08/11- 121.6 lbs  08/01- 107 lbs     Past  Medical History:  Diagnosis Date   Abnormal cardiovascular stress test    Carotid arterial disease (HCC)    MODERATE RIGHT   Cerebral AV malformation    DDD (degenerative disc disease)    SEVERE WITH SPINAL STENOSIS   Hypercholesterolemia    Hypertension    MVP (mitral valve prolapse)    MILD   Numbness    LEFT FOOT AND ANKLE   Osteopenia    Presence of permanent cardiac pacemaker    Pseudo-gout    Past Surgical History:  Procedure Laterality Date   APPENDECTOMY     BREAST MASS EXCISION     CARPAL TUNNEL RELEASE     CHOLECYSTECTOMY N/A 04/23/2022   Procedure: LAPAROSCOPIC CHOLECYSTECTOMY;  Surgeon: Abigail Miyamoto, MD;  Location: [facility];  Service: General;  Laterality: N/A;   gallbladder     PACEMAKER IMPLANT N/A 11/07/2021   Procedure: PACEMAKER IMPLANT;  Surgeon: Marinus Maw, MD;  Location: [facility];  Service: Cardiovascular;  Laterality: N/A;   REMOVAL OF PARATHYROID GLAND     STRESS NUCLEAR STUDY     ABNORMAL   TONSILLECTOMY      Allergies  Allergen Reactions   Aleve [Naproxen] Other (See Comments)   Azithromycin Other (See Comments)    Reaction not recalled   Penicillins Hives and Other (See Comments)    Bad reaction as a child after a shot of Penicillin   Pravastatin Other (See Comments)    Reaction not recalled   Sulfa Antibiotics Other (  See Comments)   Sulfa Drugs Cross Reactors Other (See Comments)    Reaction not recalled- stated she can take it "in small doses"   Tape Itching and Other (See Comments)    Cannot wear for an extended period of time   Naproxen Sodium Rash    Outpatient Encounter Medications as of 02/06/2023  Medication Sig   acetaminophen (TYLENOL) 500 MG tablet Take 500 mg by mouth every 6 (six) hours as needed for mild pain or headache.   b complex vitamins capsule Take 1 capsule by mouth daily.   dextromethorphan-guaiFENesin (ROBITUSSIN-DM) 10-100 MG/5ML liquid Take 10 mLs by mouth every 6 (six) hours as needed for  cough.   diclofenac Sodium (VOLTAREN) 1 % GEL Apply 4 g topically 4 (four) times daily.   fluticasone (FLONASE) 50 MCG/ACT nasal spray Place into both nostrils daily.   furosemide (LASIX) 20 MG tablet Take 20 mg by mouth daily.   hydrALAZINE (APRESOLINE) 50 MG tablet Take 50 mg by mouth 4 (four) times daily.   ipratropium-albuterol (DUONEB) 0.5-2.5 (3) MG/3ML SOLN Take 3 mLs by nebulization every 6 (six) hours as needed.   loperamide (IMODIUM A-D) 2 MG tablet Take 2 mg by mouth every 4 (four) hours as needed for diarrhea or loose stools.   magnesium oxide (MAG-OX) 400 (240 Mg) MG tablet Take 400 mg by mouth in the morning, at noon, and at bedtime.   Multiple Vitamins-Iron (MULTI-VITAMIN/IRON PO) Take 1 tablet by mouth daily.   ondansetron (ZOFRAN-ODT) 4 MG disintegrating tablet Take 4 mg by mouth every 4 (four) hours as needed for nausea or vomiting.   PHENobarbital (LUMINAL) 64.8 MG tablet Take 1 tablet (64.8 mg total) by mouth daily. Take 1 tablet every morning   phenytoin (DILANTIN) 100 MG ER capsule Take 200 mg by mouth daily.   Polyethyl Glycol-Propyl Glycol (SYSTANE) 0.4-0.3 % SOLN Place 1 drop into both eyes in the morning and at bedtime.   valsartan (DIOVAN) 160 MG tablet Take 1 tablet (160 mg total) by mouth 2 (two) times daily.   furosemide (LASIX) 40 MG tablet Take 1 tablet (40 mg total) by mouth daily for 2 days.   potassium chloride (KLOR-CON M) 10 MEQ tablet Take 1 tablet (10 mEq total) by mouth daily. (Patient not taking: Reported on 02/06/2023)   potassium chloride SA (KLOR-CON M) 20 MEQ tablet Take 1 tablet (20 mEq total) by mouth daily for 2 days.   Probiotic Product (ALIGN) 4 MG CAPS Take 4 mg by mouth daily. (Patient not taking: Reported on 02/06/2023)   No facility-administered encounter medications on file as of 02/06/2023.    Review of Systems  Constitutional:  Negative for activity change and appetite change.  Respiratory:  Negative for cough, shortness of breath and  wheezing.   Cardiovascular:  Positive for leg swelling. Negative for chest pain.  Musculoskeletal:  Positive for gait problem.  Neurological:  Positive for weakness. Negative for dizziness.  Psychiatric/Behavioral:  Negative for dysphoric mood. The patient is not nervous/anxious.     Immunization History  Administered Date(s) Administered   Fluad Quad(high Dose 65+) 04/16/2022   Influenza Split 03/28/2009, 03/17/2010, 03/26/2011, 04/01/2012, 03/05/2013   Influenza, Quadrivalent, Recombinant, Inj, Pf 03/08/2018, 02/13/2019, 02/24/2020   Influenza,inj,Quad PF,6+ Mos 02/25/2014, 03/05/2015   Influenza-Unspecified 02/09/2014, 02/25/2016, 04/06/2017, 04/25/2021   Moderna Sars-Covid-2 Vaccination 07/17/2019, 08/14/2019, 02/24/2020, 04/26/2020   Pneumococcal Conjugate-13 11/26/2014   Pneumococcal Polysaccharide-23 06/23/2003, 11/10/2012   Pneumococcal-Unspecified 06/23/2003, 11/10/2012   Td (Adult), 2 Lf Tetanus Toxid, Preservative Free  06/11/2000, 11/06/2011   Td (Adult),5 Lf Tetanus Toxid, Preservative Free 06/11/2000, 11/06/2011   Unspecified SARS-COV-2 Vaccination 04/28/2021   Zoster Recombinant(Shingrix) 01/09/2019, 05/08/2019   Zoster, Live 01/15/2006, 01/15/2019   Pertinent  Health Maintenance Due  Topic Date Due   DEXA SCAN  Never done   INFLUENZA VACCINE  01/10/2023      05/01/2022    9:41 AM 06/26/2022    1:39 PM 07/19/2022   10:35 AM 07/30/2022    1:44 PM 10/08/2022    2:38 PM  Fall Risk  Falls in the past year? 0 0 1 1 0  Was there an injury with Fall? 0 1 0 0 0  Fall Risk Category Calculator 0 1 2 1  0  Fall Risk Category (Retired) Low      (RETIRED) Patient Fall Risk Level High fall risk      Patient at Risk for Falls Due to Impaired balance/gait;Impaired mobility History of fall(s);Impaired balance/gait  History of fall(s) Impaired balance/gait;Impaired mobility;History of fall(s)  Fall risk Follow up Falls evaluation completed Falls evaluation completed Falls evaluation  completed Falls evaluation completed    Functional Status Survey:    Vitals:   02/06/23 1412  BP: (!) 174/70  Pulse: 80  Resp: 18  Temp: 97.9 F (36.6 C)  SpO2: 97%  Weight: 132 lb 12.8 oz (60.2 kg)  Height: 5\' 5"  (1.651 m)   Body mass index is 22.1 kg/m. Physical Exam Vitals reviewed.  Constitutional:      General: She is not in acute distress. HENT:     Head: Normocephalic.  Eyes:     General:        Right eye: No discharge.        Left eye: No discharge.     Comments: Right facial swelling  Cardiovascular:     Rate and Rhythm: Normal rate and regular rhythm.     Pulses: Normal pulses.     Heart sounds: Normal heart sounds.  Pulmonary:     Effort: Pulmonary effort is normal. No respiratory distress.     Breath sounds: Normal breath sounds. No wheezing or rales.  Abdominal:     Palpations: Abdomen is soft.  Musculoskeletal:     Cervical back: Neck supple.     Right lower leg: Edema present.     Left lower leg: Edema present.     Comments: 2+ pitting  Skin:    General: Skin is warm.     Capillary Refill: Capillary refill takes less than 2 seconds.  Neurological:     General: No focal deficit present.     Mental Status: She is alert and oriented to person, place, and time.     Motor: Weakness present.     Gait: Gait abnormal.     Comments: walker  Psychiatric:        Mood and Affect: Mood normal.     Labs reviewed: Recent Labs    04/22/22 1826 04/23/22 0811 04/24/22 0504 04/25/22 0735 04/26/22 0901 08/28/22 1131 09/18/22 0000 12/20/22 1015 12/27/22 1154  NA  --    < > 136 135   < > 141 136* 138 139  K  --    < > 3.7 3.7   < > 5.6* 5.0 5.4* 4.9  CL  --    < > 103 103   < > 104 104 103 101  CO2  --    < > 22 19*   < > 20 19 21 22   GLUCOSE  --    < >  91 89   < > 95  --  87 98  BUN  --    < > 29* 29*   < > 38* 39* 34* 30*  CREATININE  --    < > 1.14* 1.32*   < > 1.20* 1.3* 1.22* 1.19*  CALCIUM 8.1*   < > 8.1* 8.4*   < > 9.7 9.6 9.9 10.1  MG 1.1*   --  1.8 1.9  --   --   --   --   --   PHOS  --   --  4.4 3.5  --   --   --   --   --    < > = values in this interval not displayed.   Recent Labs    04/24/22 0504 04/25/22 0735 04/26/22 0901 05/07/22 0000 06/01/22 0000 07/31/22 0000 09/18/22 0000  AST 24 21 18    < > 30 32 31  ALT 16 15 12    < > 25 23 27   ALKPHOS 52 53 54   < > 77 63 3.9*  BILITOT 0.7 1.1 0.9  --   --   --   --   PROT 5.8* 5.9* 5.9*  --   --   --   --   ALBUMIN 2.8* 2.8* 2.6*   < > 3.5 3.7 3.9   < > = values in this interval not displayed.   Recent Labs    04/24/22 0504 04/25/22 0735 04/26/22 0901 05/07/22 0000 05/29/22 0000 06/01/22 0000 09/18/22 0000  WBC 11.3* 8.9 5.7   < > 4.8 6.3 5.2  NEUTROABS 10.0* 7.6  --   --   --  5.20  --   HGB 9.1* 9.5* 10.2*   < > 9.7* 10.2* 10.0*  HCT 28.1* 29.3* 32.4*   < > 29* 31* 29*  MCV 104.5* 103.5* 106.2*  --   --   --   --   PLT 237 274 283   < > 221 254 243   < > = values in this interval not displayed.   Lab Results  Component Value Date   TSH 4.325 04/25/2022   Lab Results  Component Value Date   HGBA1C 4.9 04/22/2022   Lab Results  Component Value Date   CHOL 208 (H) 01/28/2022   HDL 80 01/28/2022   LDLCALC 116 (H) 01/28/2022   TRIG 59 01/28/2022   CHOLHDL 2.6 01/28/2022    Significant Diagnostic Results in last 30 days:  No results found.  Assessment/Plan 1. Chronic diastolic congestive heart failure (HCC) - approx 20 lbs weight gain, 2+ pitting edema, right facial swelling - 07/2022 echo LVEF 60-65% - lung sounds clear - h/o left pleural effusion 12/2022 - suspect weight gain with improved appetite / ? CHF exacerbation - cont furosemide 20 mg daily - start furosemide 40 mg po BID> give 08/28 - order clarification: daily weights, report weight gain 3 lbs in one day OR 5 lbs in one week to PCP - bmp and BNP 08/29    Family/ staff Communication: plan discussed with patient and nurse  Labs/tests ordered:  bmp and bnp 08/29

## 2023-02-07 ENCOUNTER — Telehealth: Payer: Self-pay

## 2023-02-07 ENCOUNTER — Non-Acute Institutional Stay: Payer: Medicare Other | Admitting: Adult Health

## 2023-02-07 ENCOUNTER — Encounter: Payer: Self-pay | Admitting: Adult Health

## 2023-02-07 DIAGNOSIS — I10 Essential (primary) hypertension: Secondary | ICD-10-CM | POA: Diagnosis not present

## 2023-02-07 DIAGNOSIS — I5020 Unspecified systolic (congestive) heart failure: Secondary | ICD-10-CM | POA: Diagnosis not present

## 2023-02-07 DIAGNOSIS — I5033 Acute on chronic diastolic (congestive) heart failure: Secondary | ICD-10-CM

## 2023-02-07 DIAGNOSIS — J90 Pleural effusion, not elsewhere classified: Secondary | ICD-10-CM

## 2023-02-07 MED ORDER — FUROSEMIDE 20 MG PO TABS: 20.0000 mg | ORAL_TABLET | Freq: Every day | ORAL | Status: DC

## 2023-02-07 MED ORDER — FUROSEMIDE 40 MG PO TABS: 40.0000 mg | ORAL_TABLET | Freq: Every day | ORAL | Status: DC

## 2023-02-07 NOTE — Telephone Encounter (Signed)
Discussed with Dr. Ladona Ridgel.   He agrees with changing programming to [facility]VI.

## 2023-02-07 NOTE — Progress Notes (Addendum)
Location:  Medical illustrator of Service:  ALF (13) Provider:   Peggye Ley, ANP [facility] [phone]   Mahlon Gammon, MD  Patient Care Team: Mahlon Gammon, MD as PCP - General (Internal Medicine) [place], [name], MD as PCP - Cardiology (Cardiology) Van Clines, MD as Consulting Physician (Neurology)  Extended Emergency Contact Information Primary Emergency Contact: [name],[name] Mobile Phone: [phone] Relation: Daughter Secondary Emergency Contact: YASSMIN, WECK Mobile Phone: [phone] Relation: Son  Code Status:  DNR Goals of care: Advanced Directive information    02/06/2023    2:47 PM  Advanced Directives  Does Patient Have a Medical Advance Directive? Yes  Type of Advance Directive Living will;Out of facility DNR (pink MOST or yellow form)  Does patient want to make changes to medical advance directive? No - Patient declined     Chief Complaint  Patient presents with   Acute Visit    F/u weight gain     HPI:  Pt is a 87 y.o. female seen today for an acute visit for weight gain  PMH: aortic atherosclerosis, cerebral AV malformation, HTN, HLD, Heart block AV-s/p PPM 05/30, MVP, afib, epilepsy, encephalopathy, neuropathy, anemia, and unstable gait.  She is s/p lap chole 04/23/22 and had and post op seizure. S/p PPM placement on 05/30 for CHB  01/27/22 Diagnosed with Pericarditis with Pericardial Effusion, which was treated with colchicine.   She was seen on 02/06/23 due to edema and 20 lb weight gain.  BP was elevated 174/70.  Part of her weight gain was intentional as she had lost weight due to acute illness with cough/bronchitis, CHF, also had covid earlier this year.  Lasix was ordered 40 mg bid for 1 day on 8/28, she typically takes 20 mg daily. She lost 1.5 lbs in the past 24 hrs.  She denies any PND, DOE , palpitations or chest pain She does report worsening edema to legs and face BMP and BNP drawn  02/07/2023 Her medtronic device is undersensing, and some adjustments are being made per EP notes.     Reviewed echo 08/23/22 EF 60-65% with severely elevated pulmonary artery pressure, bilateral atrial enlargement,  Mostly posterior pericardial effusion measuring 1.77 cm. Compared with  the echo 02/2022, the effusion is larger. No evidence of tamponade.  Moderate pericardial effusion. There is no evidence of cardiac tamponade.  Moderate pleural effusion in the left  lateral region.     Afib not on DOAC due to cerebral AV malformation  Past Medical History:  Diagnosis Date   Abnormal cardiovascular stress test    Carotid arterial disease (HCC)    MODERATE RIGHT   Cerebral AV malformation    DDD (degenerative disc disease)    SEVERE WITH SPINAL STENOSIS   Hypercholesterolemia    Hypertension    MVP (mitral valve prolapse)    MILD   Numbness    LEFT FOOT AND ANKLE   Osteopenia    Presence of permanent cardiac pacemaker    Pseudo-gout    Past Surgical History:  Procedure Laterality Date   APPENDECTOMY     BREAST MASS EXCISION     CARPAL TUNNEL RELEASE     CHOLECYSTECTOMY N/A 04/23/2022   Procedure: LAPAROSCOPIC CHOLECYSTECTOMY;  Surgeon: Abigail Miyamoto, MD;  Location: [facility] ORS;  Service: General;  Laterality: N/A;   gallbladder     PACEMAKER IMPLANT N/A 11/07/2021   Procedure: PACEMAKER IMPLANT;  Surgeon: Marinus Maw, MD;  Location: [facility] INVASIVE CV  LAB;  Service: Cardiovascular;  Laterality: N/A;   REMOVAL OF PARATHYROID GLAND     STRESS NUCLEAR STUDY     ABNORMAL   TONSILLECTOMY      Allergies  Allergen Reactions   Aleve [Naproxen] Other (See Comments)   Azithromycin Other (See Comments)    Reaction not recalled   Penicillins Hives and Other (See Comments)    Bad reaction as a child after a shot of Penicillin   Pravastatin Other (See Comments)    Reaction not recalled   Sulfa Antibiotics Other (See Comments)   Sulfa Drugs Cross Reactors Other (See Comments)     Reaction not recalled- stated she can take it "in small doses"   Tape Itching and Other (See Comments)    Cannot wear for an extended period of time   Naproxen Sodium Rash    Outpatient Encounter Medications as of 02/07/2023  Medication Sig   acetaminophen (TYLENOL) 500 MG tablet Take 500 mg by mouth every 6 (six) hours as needed for mild pain or headache.   b complex vitamins capsule Take 1 capsule by mouth daily.   dextromethorphan-guaiFENesin (ROBITUSSIN-DM) 10-100 MG/5ML liquid Take 10 mLs by mouth every 6 (six) hours as needed for cough.   diclofenac Sodium (VOLTAREN) 1 % GEL Apply 4 g topically 4 (four) times daily.   fluticasone (FLONASE) 50 MCG/ACT nasal spray Place into both nostrils daily.   furosemide (LASIX) 20 MG tablet Take 20 mg by mouth daily.   furosemide (LASIX) 40 MG tablet Take 1 tablet (40 mg total) by mouth daily for 2 days.   hydrALAZINE (APRESOLINE) 50 MG tablet Take 50 mg by mouth 4 (four) times daily.   ipratropium-albuterol (DUONEB) 0.5-2.5 (3) MG/3ML SOLN Take 3 mLs by nebulization every 6 (six) hours as needed.   loperamide (IMODIUM A-D) 2 MG tablet Take 2 mg by mouth every 4 (four) hours as needed for diarrhea or loose stools.   magnesium oxide (MAG-OX) 400 (240 Mg) MG tablet Take 400 mg by mouth in the morning, at noon, and at bedtime.   Multiple Vitamins-Iron (MULTI-VITAMIN/IRON PO) Take 1 tablet by mouth daily.   ondansetron (ZOFRAN-ODT) 4 MG disintegrating tablet Take 4 mg by mouth every 4 (four) hours as needed for nausea or vomiting.   PHENobarbital (LUMINAL) 64.8 MG tablet Take 1 tablet (64.8 mg total) by mouth daily. Take 1 tablet every morning   phenytoin (DILANTIN) 100 MG ER capsule Take 200 mg by mouth daily.   Polyethyl Glycol-Propyl Glycol (SYSTANE) 0.4-0.3 % SOLN Place 1 drop into both eyes in the morning and at bedtime.   potassium chloride (KLOR-CON M) 10 MEQ tablet Take 1 tablet (10 mEq total) by mouth daily. (Patient not taking: Reported on  02/06/2023)   potassium chloride SA (KLOR-CON M) 20 MEQ tablet Take 1 tablet (20 mEq total) by mouth daily for 2 days.   Probiotic Product (ALIGN) 4 MG CAPS Take 4 mg by mouth daily. (Patient not taking: Reported on 02/06/2023)   valsartan (DIOVAN) 160 MG tablet Take 1 tablet (160 mg total) by mouth 2 (two) times daily.   No facility-administered encounter medications on file as of 02/07/2023.    Review of Systems  Constitutional:  Positive for fatigue and unexpected weight change. Negative for activity change, appetite change, chills, diaphoresis and fever.  HENT:  Negative for congestion.   Respiratory:  Negative for cough, shortness of breath and wheezing.   Cardiovascular:  Positive for leg swelling. Negative for chest pain and palpitations.  Gastrointestinal:  Negative for abdominal distention, abdominal pain, constipation and diarrhea.  Genitourinary:  Negative for difficulty urinating and dysuria.  Musculoskeletal:  Positive for gait problem. Negative for arthralgias, back pain, joint swelling and myalgias.  Neurological:  Negative for dizziness, tremors, seizures, syncope, facial asymmetry, speech difficulty, weakness, light-headedness, numbness and headaches.  Psychiatric/Behavioral:  Negative for agitation, behavioral problems and confusion.        Memory loss    Immunization History  Administered Date(s) Administered   Fluad Quad(high Dose 65+) 04/16/2022   Influenza Split 03/28/2009, 03/17/2010, 03/26/2011, 04/01/2012, 03/05/2013   Influenza, Quadrivalent, Recombinant, Inj, Pf 03/08/2018, 02/13/2019, 02/24/2020   Influenza,inj,Quad PF,6+ Mos 02/25/2014, 03/05/2015   Influenza-Unspecified 02/09/2014, 02/25/2016, 04/06/2017, 04/25/2021   Moderna Sars-Covid-2 Vaccination 07/17/2019, 08/14/2019, 02/24/2020, 04/26/2020   Pneumococcal Conjugate-13 11/26/2014   Pneumococcal Polysaccharide-23 06/23/2003, 11/10/2012   Pneumococcal-Unspecified 06/23/2003, 11/10/2012   Td (Adult), 2 Lf  Tetanus Toxid, Preservative Free 06/11/2000, 11/06/2011   Td (Adult),5 Lf Tetanus Toxid, Preservative Free 06/11/2000, 11/06/2011   Unspecified SARS-COV-2 Vaccination 04/28/2021   Zoster Recombinant(Shingrix) 01/09/2019, 05/08/2019   Zoster, Live 01/15/2006, 01/15/2019   Pertinent  Health Maintenance Due  Topic Date Due   DEXA SCAN  Never done   INFLUENZA VACCINE  01/10/2023      06/26/2022    1:39 PM 07/19/2022   10:35 AM 07/30/2022    1:44 PM 10/08/2022    2:38 PM 02/06/2023    3:46 PM  Fall Risk  Falls in the past year? 0 1 1 0 0  Was there an injury with Fall? 1 0 0 0 0  Fall Risk Category Calculator 1 2 1  0 0  Patient at Risk for Falls Due to History of fall(s);Impaired balance/gait  History of fall(s) Impaired balance/gait;Impaired mobility;History of fall(s) History of fall(s);Impaired balance/gait  Fall risk Follow up Falls evaluation completed Falls evaluation completed Falls evaluation completed  Falls evaluation completed;Education provided   Functional Status Survey:    Vitals:   02/07/23 1554  BP: (!) 170/55  Weight: 131 lb 6.4 oz (59.6 kg)   Body mass index is 21.87 kg/m. Physical Exam Vitals and nursing note reviewed.  Constitutional:      General: She is not in acute distress.    Appearance: She is not diaphoretic.  HENT:     Head: Normocephalic and atraumatic.  Neck:     Vascular: No JVD.  Cardiovascular:     Rate and Rhythm: Normal rate and regular rhythm.     Heart sounds: No murmur heard. Pulmonary:     Effort: Pulmonary effort is normal. No respiratory distress.     Breath sounds: Normal breath sounds. No wheezing.  Musculoskeletal:     Comments: BLE edema +2 pitting L>R  Skin:    General: Skin is warm and dry.  Neurological:     Mental Status: She is alert and oriented to person, place, and time.     Labs reviewed: Recent Labs    04/22/22 1826 04/23/22 0811 04/24/22 0504 04/25/22 0735 04/26/22 0901 08/28/22 1131 09/18/22 0000  12/20/22 1015 12/27/22 1154 01/07/23 0000  NA  --    < > 136 135   < > 141   < > 138 139 133*  K  --    < > 3.7 3.7   < > 5.6*   < > 5.4* 4.9 4.8  4.8  CL  --    < > 103 103   < > 104   < > 103 101 101  101  CO2  --    < > 22 19*   < > 20   < > 21 22 23*  GLUCOSE  --    < > 91 89   < > 95  --  87 98  --   BUN  --    < > 29* 29*   < > 38*   < > 34* 30* 40*  CREATININE  --    < > 1.14* 1.32*   < > 1.20*   < > 1.22* 1.19* 1.1  CALCIUM 8.1*   < > 8.1* 8.4*   < > 9.7   < > 9.9 10.1 8.7  MG 1.1*  --  1.8 1.9  --   --   --   --   --   --   PHOS  --   --  4.4 3.5  --   --   --   --   --   --    < > = values in this interval not displayed.   Recent Labs    04/24/22 0504 04/25/22 0735 04/26/22 0901 05/07/22 0000 06/01/22 0000 07/31/22 0000 09/18/22 0000  AST 24 21 18    < > 30 32 31  ALT 16 15 12    < > 25 23 27   ALKPHOS 52 53 54   < > 77 63 3.9*  BILITOT 0.7 1.1 0.9  --   --   --   --   PROT 5.8* 5.9* 5.9*  --   --   --   --   ALBUMIN 2.8* 2.8* 2.6*   < > 3.5 3.7 3.9   < > = values in this interval not displayed.   Recent Labs    04/24/22 0504 04/25/22 0735 04/26/22 0901 05/07/22 0000 06/01/22 0000 09/18/22 0000 01/08/23 0000  WBC 11.3* 8.9 5.7   < > 6.3 5.2 4.9  NEUTROABS 10.0* 7.6  --   --  5.20  --   --   HGB 9.1* 9.5* 10.2*   < > 10.2* 10.0* 10.5*  HCT 28.1* 29.3* 32.4*   < > 31* 29* 29*  MCV 104.5* 103.5* 106.2*  --   --   --   --   PLT 237 274 283   < > 254 243 223   < > = values in this interval not displayed.   Lab Results  Component Value Date   TSH 4.325 04/25/2022   Lab Results  Component Value Date   HGBA1C 4.9 04/22/2022   Lab Results  Component Value Date   CHOL 208 (H) 01/28/2022   HDL 80 01/28/2022   LDLCALC 116 (H) 01/28/2022   TRIG 59 01/28/2022   CHOLHDL 2.6 01/28/2022    Significant Diagnostic Results in last 30 days:  CUP PACEART REMOTE DEVICE CHECK  Result Date: 02/06/2023 Scheduled remote reviewed. Normal device function.  Presenting  shows undersensed AF, small P wave of 0.1 mv at max atrial sensitivity is noted.  Over 7,500 AHR detections consistent with AF totaling 33.8%, AF burden likely underreported due to undersensing.  Known history of AF, not a candidate for OAC or Watchman per Epic. Routing to triage for further review of undersensing in the setting of persistent AF, consider programming to VVI mode. Next remote 91 days. - CS, CVRS   Assessment/Plan  1. Acute on chronic diastolic (congestive) heart failure (HCC) Increase lasix to 40 mg daily for 4 days then return to 20 mg Kdur was previously  stopped due to hyperkalemia, pt is on ARB  2. Primary hypertension Above goal but the automatic cuff is being used and she has a wide variation between the systolic and diastolic Will diurese more BP manual daily for 1 week and report. Continue ARB and hydralazine.   3. Left pleural effusion Lungs were clear with air moving through the left base well with no symptoms. I suspect this effusion has decreased in size. Currently  on increased lasix dosing.  Has been noted on prior xray 7/30  and echo   4. Pericardial effusion Treated with colchicine Followed by cardiology.  I will reach out to her cardiologist via staff messaging for coordination of care.   Family/ staff Communication: nurse and resident.    Labs/tests ordered:  BMP BNP pending.    Total time :  time greater than 50% of total time spent doing pt counseling and coordination of care   Addendum 02/08/23: BMP returned WNL. BNP 551. Continue increased lasix dosing.

## 2023-02-07 NOTE — Telephone Encounter (Signed)
Scheduled remote reviewed. Normal device function.    Presenting shows undersensed AF, small P wave of 0.1 mv at max atrial sensitivity is noted. Programmed Sensitivity 0.51mv.  Over 7,500 AHR detections consistent with AF totaling 33.8%, AF burden likely underreported due to undersensing.  Known history of AF, not a candidate for OAC or Watchman per Epic.  Routing to triage for further review of undersensing in the setting of persistent AF, consider programming to VVI mode. Next remote 91 days. - CS, CVRS  Forwarding to Dr. Ladona Ridgel for review if we should bring in for reprogramming VVI. Patient resides at a nursing facility.

## 2023-02-08 NOTE — Telephone Encounter (Signed)
Call back received from Pt.  Pt scheduled with device clinic for device reprogramming.  Pt states she has been fatigued.  Reviewed Pt's histograms.  Pt may benefit from adding rate response when she is reprogrammed to VVI.  Will confirm with Dr. Ladona Ridgel.

## 2023-02-08 NOTE — Telephone Encounter (Signed)
Attempted to call Pt.  No answer.  No VM.  Spoke with daughter per DPR.  Advised of need to reprogram device d/t small P waves.  Daughter will have Pt call to schedule device clinic appointment for reprogramming.

## 2023-02-12 NOTE — Telephone Encounter (Signed)
Ok to turn on rate response. GT

## 2023-02-15 NOTE — Progress Notes (Signed)
Remote pacemaker transmission.   

## 2023-02-27 ENCOUNTER — Ambulatory Visit: Payer: Medicare Other | Attending: Interventional Cardiology

## 2023-02-27 DIAGNOSIS — I442 Atrioventricular block, complete: Secondary | ICD-10-CM

## 2023-02-27 LAB — CUP PACEART INCLINIC DEVICE CHECK
Date Time Interrogation Session: 20240918201139
Implantable Lead Connection Status: 753985
Implantable Lead Connection Status: 753985
Implantable Lead Implant Date: 20230530
Implantable Lead Implant Date: 20230530
Implantable Lead Location: 753859
Implantable Lead Location: 753860
Implantable Lead Model: 3830
Implantable Lead Model: 5076
Implantable Pulse Generator Implant Date: 20230530

## 2023-02-27 NOTE — Progress Notes (Signed)
Pacemaker check in clinic. Patient brought in acutely to clinic today to make changes to her programming due to persistent AF and noted undersensing of AF burden due to small P waves.  Atrial sensitivity programmed already at 0.44mv.  Device also interrogated today with otherwise normal device function. Multiple AT/AF events noted, AF burden only 66%; however, suspect closer to 100% due to frequent undersensing of fib waves. Patient reports doing well, but has noted ongoing fatigue.  Not a candidate for OAC or watchman.     PROGRAMMING CHANGE MADE: 1.  Mode from DDD 60 to VVIR 60 (rate response turned on due to patient's complaint of fatigue).

## 2023-03-07 DIAGNOSIS — Z961 Presence of intraocular lens: Secondary | ICD-10-CM | POA: Diagnosis not present

## 2023-03-07 DIAGNOSIS — H52203 Unspecified astigmatism, bilateral: Secondary | ICD-10-CM | POA: Diagnosis not present

## 2023-03-19 ENCOUNTER — Encounter: Payer: Self-pay | Admitting: Internal Medicine

## 2023-03-19 ENCOUNTER — Ambulatory Visit: Payer: Medicare Other | Admitting: Internal Medicine

## 2023-03-19 VITALS — BP 182/74 | HR 73 | Temp 97.2°F | Ht 65.0 in | Wt 139.6 lb

## 2023-03-19 DIAGNOSIS — J90 Pleural effusion, not elsewhere classified: Secondary | ICD-10-CM | POA: Diagnosis not present

## 2023-03-19 DIAGNOSIS — I10 Essential (primary) hypertension: Secondary | ICD-10-CM | POA: Diagnosis not present

## 2023-03-19 DIAGNOSIS — D631 Anemia in chronic kidney disease: Secondary | ICD-10-CM | POA: Diagnosis not present

## 2023-03-19 DIAGNOSIS — N1831 Chronic kidney disease, stage 3a: Secondary | ICD-10-CM

## 2023-03-19 DIAGNOSIS — I5033 Acute on chronic diastolic (congestive) heart failure: Secondary | ICD-10-CM

## 2023-03-19 MED ORDER — AMLODIPINE BESYLATE 2.5 MG PO TABS: 2.5000 mg | ORAL_TABLET | Freq: Every day | ORAL | Status: DC

## 2023-03-19 NOTE — Progress Notes (Signed)
Location:  [facility]   Place of Service:  Clinic   Provider:   Code Status: DNR Goals of Care:     03/19/2023   11:25 AM  Advanced Directives  Does Patient Have a Medical Advance Directive? Yes  Type of Advance Directive Living will;Out of facility DNR (pink MOST or yellow form)  Does patient want to make changes to medical advance directive? No - Patient declined     Chief Complaint  Patient presents with   Acute Visit    Complains of Hypertension and Edema    HPI: Patient is a 87 y.o. female seen today for an acute visit for Weight gain ,Edema and Hypertension  She is in [place] in [place] Acute issue Hypertension Her BP has been running high in past few days Some more then 180 She is asymptomatic She has also noticed her Legs are swollen both Has gained almost 10 lbs  Wt Readings from Last 3 Encounters:  03/19/23 139 lb 9.6 oz (63.3 kg)  02/07/23 131 lb 6.4 oz (59.6 kg)  02/06/23 132 lb 12.8 oz (60.2 kg)    She denies any Cough or SOB or chest pain or Fever Walks with her walker Low endurance She does have history of CHF and Left Pleural effusion  Other issues Has history of chronic diarrhea and Previous h/o C Diff colitis S/p PPM placement on 05/30 for CHB  A Fib Amiodarone tried but then taken off due to GI side effects Not on anticoagulation due to cerebral AVMs.  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 teen Had a work-up at [facility] and has been on Dilantin since then History of unhealed right humerus shaft fracture Peripheral neuropathy  Past Medical History:  Diagnosis Date   Abnormal cardiovascular stress test    Carotid arterial disease (HCC)    MODERATE RIGHT   Cerebral AV malformation    DDD (degenerative disc disease)    SEVERE WITH SPINAL STENOSIS   Hypercholesterolemia    Hypertension    MVP (mitral valve prolapse)    MILD   Numbness    LEFT FOOT AND ANKLE    Osteopenia    Presence of permanent cardiac pacemaker    Pseudo-gout     Past Surgical History:  Procedure Laterality Date   APPENDECTOMY     BREAST MASS EXCISION     CARPAL TUNNEL RELEASE     CHOLECYSTECTOMY N/A 04/23/2022   Procedure: LAPAROSCOPIC CHOLECYSTECTOMY;  Surgeon: Abigail Miyamoto, MD;  Location: [facility];  Service: General;  Laterality: N/A;   gallbladder     PACEMAKER IMPLANT N/A 11/07/2021   Procedure: PACEMAKER IMPLANT;  Surgeon: Marinus Maw, MD;  Location: [facility];  Service: Cardiovascular;  Laterality: N/A;   REMOVAL OF PARATHYROID GLAND     STRESS NUCLEAR STUDY     ABNORMAL   TONSILLECTOMY      Allergies  Allergen Reactions   Aleve [Naproxen] Other (See Comments)   Azithromycin Other (See Comments)    Reaction not recalled   Penicillins Hives and Other (See Comments)    Bad reaction as a child after a shot of Penicillin   Pravastatin Other (See Comments)    Reaction not recalled   Sulfa Antibiotics Other (See Comments)   Sulfa Drugs Cross Reactors Other (See Comments)    Reaction not recalled- stated she can take it "in small doses"   Tape Itching and Other (See  Comments)    Cannot wear for an extended period of time   Naproxen Sodium Rash    Outpatient Encounter Medications as of 03/19/2023  Medication Sig   acetaminophen (TYLENOL) 500 MG tablet Take 500 mg by mouth every 6 (six) hours as needed for mild pain or headache.   amLODipine (NORVASC) 2.5 MG tablet Take 1 tablet (2.5 mg total) by mouth daily.   b complex vitamins capsule Take 1 capsule by mouth daily.   dextromethorphan-guaiFENesin (ROBITUSSIN-DM) 10-100 MG/5ML liquid Take 10 mLs by mouth every 6 (six) hours as needed for cough.   diclofenac Sodium (VOLTAREN) 1 % GEL Apply 4 g topically 4 (four) times daily.   fluticasone (FLONASE) 50 MCG/ACT nasal spray Place into both nostrils daily.   furosemide (LASIX) 20 MG tablet Take 40 mg by mouth daily.   hydrALAZINE (APRESOLINE) 50 MG  tablet Take 50 mg by mouth 4 (four) times daily.   ipratropium-albuterol (DUONEB) 0.5-2.5 (3) MG/3ML SOLN Take 3 mLs by nebulization every 6 (six) hours as needed.   loperamide (IMODIUM A-D) 2 MG tablet Take 2 mg by mouth every 4 (four) hours as needed for diarrhea or loose stools.   magnesium oxide (MAG-OX) 400 (240 Mg) MG tablet Take 400 mg by mouth in the morning, at noon, and at bedtime.   Multiple Vitamins-Iron (MULTI-VITAMIN/IRON PO) Take 1 tablet by mouth daily.   ondansetron (ZOFRAN-ODT) 4 MG disintegrating tablet Take 4 mg by mouth every 4 (four) hours as needed for nausea or vomiting.   PHENobarbital (LUMINAL) 64.8 MG tablet Take 1 tablet (64.8 mg total) by mouth daily. Take 1 tablet every morning   phenytoin (DILANTIN) 100 MG ER capsule Take 200 mg by mouth daily.   Polyethyl Glycol-Propyl Glycol (SYSTANE) 0.4-0.3 % SOLN Place 1 drop into both eyes in the morning and at bedtime.   Probiotic Product (ALIGN) 4 MG CAPS Take 4 mg by mouth daily.   valsartan (DIOVAN) 160 MG tablet Take 1 tablet (160 mg total) by mouth 2 (two) times daily.   [DISCONTINUED] furosemide (LASIX) 20 MG tablet Take 1 tablet (20 mg total) by mouth daily. (Patient taking differently: Take 20 mg by mouth daily as needed. For Weight Gain 3lb in 1 day or 5lb in 1 week.)   [DISCONTINUED] furosemide (LASIX) 40 MG tablet Take 1 tablet (40 mg total) by mouth daily for 4 days.   [DISCONTINUED] potassium chloride SA (KLOR-CON M) 20 MEQ tablet Take 1 tablet (20 mEq total) by mouth daily for 2 days.   No facility-administered encounter medications on file as of 03/19/2023.    Review of Systems:  Review of Systems  Constitutional:  Positive for unexpected weight change. Negative for activity change and appetite change.  HENT: Negative.    Respiratory:  Negative for cough and shortness of breath.   Cardiovascular:  Positive for leg swelling.  Gastrointestinal:  Negative for constipation.  Genitourinary: Negative.    Musculoskeletal:  Positive for gait problem. Negative for arthralgias and myalgias.  Skin: Negative.   Neurological:  Negative for dizziness and weakness.  Psychiatric/Behavioral:  Negative for confusion, dysphoric mood and sleep disturbance.     Health Maintenance  Topic Date Due   DEXA SCAN  Never done   INFLUENZA VACCINE  01/10/2023   COVID-19 Vaccine (6 - 2023-24 season) 02/10/2023   Medicare Annual Wellness (AWV)  03/20/2023   Pneumonia Vaccine 50+ Years old  Completed   Zoster Vaccines- Shingrix  Completed   HPV VACCINES  Aged Out  DTaP/Tdap/Td  Discontinued    Physical Exam: Vitals:   03/19/23 1123 03/19/23 1135  BP: (!) 180/62 (!) 182/74  Pulse: 73   Temp: (!) 97.2 F (36.2 C)   SpO2: 98%   Weight: 139 lb 9.6 oz (63.3 kg)   Height: 5\' 5"  (1.651 m)    Body mass index is 23.23 kg/m. Physical Exam Vitals reviewed.  Constitutional:      Appearance: Normal appearance.  HENT:     Head: Normocephalic.     Nose: Nose normal.     Mouth/Throat:     Mouth: Mucous membranes are moist.     Pharynx: Oropharynx is clear.  Eyes:     Pupils: Pupils are equal, round, and reactive to light.  Cardiovascular:     Rate and Rhythm: Normal rate and regular rhythm.     Pulses: Normal pulses.     Heart sounds: Normal heart sounds. No murmur heard. Pulmonary:     Effort: Pulmonary effort is normal.     Breath sounds: Normal breath sounds.  Abdominal:     General: Abdomen is flat. Bowel sounds are normal.     Palpations: Abdomen is soft.  Musculoskeletal:        General: Swelling present.     Cervical back: Neck supple.     Comments: Moderate Edema  Skin:    General: Skin is warm.  Neurological:     General: No focal deficit present.     Mental Status: She is alert and oriented to person, place, and time.  Psychiatric:        Mood and Affect: Mood normal.        Thought Content: Thought content normal.     Labs reviewed: Basic Metabolic Panel: Recent Labs     04/22/22 1826 04/23/22 0811 04/24/22 0504 04/25/22 0735 04/26/22 0901 08/28/22 1131 09/18/22 0000 12/20/22 1015 12/27/22 1154 01/07/23 0000  NA  --    < > 136 135   < > 141   < > 138 139 133*  K  --    < > 3.7 3.7   < > 5.6*   < > 5.4* 4.9 4.8  4.8  CL  --    < > 103 103   < > 104   < > 103 101 101  101  CO2  --    < > 22 19*   < > 20   < > 21 22 23*  GLUCOSE  --    < > 91 89   < > 95  --  87 98  --   BUN  --    < > 29* 29*   < > 38*   < > 34* 30* 40*  CREATININE  --    < > 1.14* 1.32*   < > 1.20*   < > 1.22* 1.19* 1.1  CALCIUM 8.1*   < > 8.1* 8.4*   < > 9.7   < > 9.9 10.1 8.7  MG 1.1*  --  1.8 1.9  --   --   --   --   --   --   PHOS  --   --  4.4 3.5  --   --   --   --   --   --   TSH  --   --   --  4.325  --   --   --   --   --   --    < > =  values in this interval not displayed.   Liver Function Tests: Recent Labs    04/24/22 0504 04/25/22 0735 04/26/22 0901 05/07/22 0000 06/01/22 0000 07/31/22 0000 09/18/22 0000  AST 24 21 18    < > 30 32 31  ALT 16 15 12    < > 25 23 27   ALKPHOS 52 53 54   < > 77 63 3.9*  BILITOT 0.7 1.1 0.9  --   --   --   --   PROT 5.8* 5.9* 5.9*  --   --   --   --   ALBUMIN 2.8* 2.8* 2.6*   < > 3.5 3.7 3.9   < > = values in this interval not displayed.   Recent Labs    04/22/22 0434  LIPASE 42   Recent Labs    04/26/22 0901  AMMONIA 18   CBC: Recent Labs    04/24/22 0504 04/25/22 0735 04/26/22 0901 05/07/22 0000 06/01/22 0000 09/18/22 0000 01/08/23 0000  WBC 11.3* 8.9 5.7   < > 6.3 5.2 4.9  NEUTROABS 10.0* 7.6  --   --  5.20  --   --   HGB 9.1* 9.5* 10.2*   < > 10.2* 10.0* 10.5*  HCT 28.1* 29.3* 32.4*   < > 31* 29* 29*  MCV 104.5* 103.5* 106.2*  --   --   --   --   PLT 237 274 283   < > 254 243 223   < > = values in this interval not displayed.   Lipid Panel: No results for input(s): "CHOL", "HDL", "LDLCALC", "TRIG", "CHOLHDL", "LDLDIRECT" in the last 8760 hours. Lab Results  Component Value Date   HGBA1C 4.9  04/22/2022    Procedures since last visit: CUP PACEART INCLINIC DEVICE CHECK  Result Date: 02/27/2023 Pacemaker check in clinic. Patient brought in acutely to clinic today to make changes to her programming due to persistent AF and noted undersensing of AF burden due to small P waves.  Atrial sensitivity programmed already at 0.85mv.  Device also interrogated today with otherwise normal device function. Multiple AT/AF events noted, AF burden only 66%; however, suspect closer to 100% due to frequent undersensing of fib waves. Patient reports doing well, but has noted ongoing fatigue.  Not a candidate for OAC or watchman.   PROGRAMMING CHANGE MADE: 1.  Mode from DDD 60 to VVIR 60 (rate response turned on due to patient's complaint of fatigue).Syliva Overman, RN   Assessment/Plan 1. Acute on chronic diastolic (congestive) heart failure (HCC) Change her Lasix to 40 mg every day BMP in 2 weeks Reval in 1 week  2. Primary hypertension Start her on Norvasc 2.5 mg every day Hydralazine 10 mg PRN for SBP more then 180  3. Stage 3a chronic kidney disease (HCC) Creat stable  4. Anemia due to stage 3a chronic kidney disease (HCC) HGB stable  5. Pleural effusion, left Will Need Follow up after Diuresis She is ASymptomatic    Labs/tests ordered:  BMP in 2 weeks Next appt:  03/25/2023

## 2023-03-22 ENCOUNTER — Telehealth: Payer: Self-pay | Admitting: Adult Health

## 2023-03-22 MED ORDER — TORSEMIDE 20 MG PO TABS: 20.0000 mg | ORAL_TABLET | Freq: Every day | ORAL | 0 refills | Status: DC

## 2023-03-22 NOTE — Telephone Encounter (Signed)
Nurse reported no change in weight or edema on lasix. Will discontinue and try torsemide. Has f/u Apt Monday 10/14 and labs already ordered.

## 2023-03-25 ENCOUNTER — Encounter: Payer: Self-pay | Admitting: Adult Health

## 2023-03-25 ENCOUNTER — Non-Acute Institutional Stay: Payer: Medicare Other | Admitting: Adult Health

## 2023-03-25 VITALS — BP 148/68 | HR 81 | Temp 97.7°F | Resp 17 | Ht 65.0 in | Wt 132.4 lb

## 2023-03-25 DIAGNOSIS — R195 Other fecal abnormalities: Secondary | ICD-10-CM | POA: Diagnosis not present

## 2023-03-25 DIAGNOSIS — J90 Pleural effusion, not elsewhere classified: Secondary | ICD-10-CM | POA: Diagnosis not present

## 2023-03-25 DIAGNOSIS — I10 Essential (primary) hypertension: Secondary | ICD-10-CM | POA: Diagnosis not present

## 2023-03-25 DIAGNOSIS — I5033 Acute on chronic diastolic (congestive) heart failure: Secondary | ICD-10-CM | POA: Diagnosis not present

## 2023-03-25 MED ORDER — TORSEMIDE 20 MG PO TABS: 10.0000 mg | ORAL_TABLET | ORAL | Status: DC

## 2023-03-25 MED ORDER — TORSEMIDE 20 MG PO TABS: 20.0000 mg | ORAL_TABLET | ORAL | Status: DC

## 2023-03-25 MED ORDER — AMLODIPINE BESYLATE 5 MG PO TABS: 5.0000 mg | ORAL_TABLET | Freq: Every day | ORAL | Status: AC

## 2023-03-25 NOTE — Progress Notes (Signed)
Location:  [facility] illustrator of Service:  Clinic (12) Provider:   Peggye Ley, ANP [facility] [phone]   Mahlon Gammon, MD  Patient Care Team: Mahlon Gammon, MD as PCP - General (Internal Medicine) [place], [name], MD as PCP - Cardiology (Cardiology) Van Clines, MD as Consulting Physician (Neurology)  Extended Emergency Contact Information Primary Emergency Contact: [name],[name] Mobile Phone: [phone] Relation: Daughter Secondary Emergency Contact: VIANCA, BRACHER Mobile Phone: [phone] Relation: Son  Code Status:  DNR Goals of care: Advanced Directive information    03/25/2023    8:48 AM  Advanced Directives  Does Patient Have a Medical Advance Directive? Yes  Type of Advance Directive Living will;Out of facility DNR (pink MOST or yellow form)  Does patient want to make changes to medical advance directive? No - Patient declined     Chief Complaint  Patient presents with   Medical Management of Chronic Issues    Patient is being seen for a follow up    HPI:  Pt is a 87 y.o. female seen today for an acute visit  follow up regarding CHF  PMH: aortic atherosclerosis, cerebral AV malformation, HTN, HLD, Heart block AV-s/p PPM 05/30, MVP, afib, epilepsy, encephalopathy, neuropathy, anemia, and unstable gait.  She is s/p lap chole 04/23/22 and had and post op seizure. S/p PPM placement on 05/30 for CHB  01/27/22 Diagnosed with Pericarditis with Pericardial Effusion, which was treated with colchicine.    Her medtronic device is undersensing with afib, and some adjustments are being made per EP notes 02/27/23.   Seen by Dr Chales Abrahams 03/19/23 diagnosed with acute CHF, Lasix dose increased to 40 mg daily. After a few days there was no change in weight or edema. BP was elevated also and Norvasc started later dose was increased to 5 mg.  On 10/11 the lasix was discontinued and torsemide started at 20 mg daily.  Feel very  tired and washed out. Denies sob with exertion or lying down. No dizziness. Feels so weak.  Reports loose stool x 1. No abd pain. Lots of gas and sounds.    Reviewed echo 08/23/22 EF 60-65% with severely elevated pulmonary artery pressure, bilateral atrial enlargement,  Mostly posterior pericardial effusion measuring 1.77 cm. Compared with  the echo 02/2022, the effusion is larger. No evidence of tamponade.  Moderate pericardial effusion. There is no evidence of cardiac tamponade.  Moderate pleural effusion in the left  lateral region.     Afib not on DOAC due to cerebral AV malformation  Past Medical History:  Diagnosis Date   Abnormal cardiovascular stress test    Carotid arterial disease (HCC)    MODERATE RIGHT   Cerebral AV malformation    DDD (degenerative disc disease)    SEVERE WITH SPINAL STENOSIS   Hypercholesterolemia    Hypertension    MVP (mitral valve prolapse)    MILD   Numbness    LEFT FOOT AND ANKLE   Osteopenia    Presence of permanent cardiac pacemaker    Pseudo-gout    Past Surgical History:  Procedure Laterality Date   APPENDECTOMY     BREAST MASS EXCISION     CARPAL TUNNEL RELEASE     CHOLECYSTECTOMY N/A 04/23/2022   Procedure: LAPAROSCOPIC CHOLECYSTECTOMY;  Surgeon: Abigail Miyamoto, MD;  Location: [facility];  Service: General;  Laterality: N/A;   gallbladder     PACEMAKER IMPLANT N/A 11/07/2021   Procedure: PACEMAKER IMPLANT;  Surgeon: Lewayne Bunting  W, MD;  Location: [facility];  Service: Cardiovascular;  Laterality: N/A;   REMOVAL OF PARATHYROID GLAND     STRESS NUCLEAR STUDY     ABNORMAL   TONSILLECTOMY      Allergies  Allergen Reactions   Aleve [Naproxen] Other (See Comments)   Azithromycin Other (See Comments)    Reaction not recalled   Penicillins Hives and Other (See Comments)    Bad reaction as a child after a shot of Penicillin   Pravastatin Other (See Comments)    Reaction not recalled   Sulfa Antibiotics Other (See Comments)    Sulfa Drugs Cross Reactors Other (See Comments)    Reaction not recalled- stated she can take it "in small doses"   Tape Itching and Other (See Comments)    Cannot wear for an extended period of time   Naproxen Sodium Rash    Outpatient Encounter Medications as of 03/25/2023  Medication Sig   acetaminophen (TYLENOL) 500 MG tablet Take 500 mg by mouth every 6 (six) hours as needed for mild pain or headache.   amLODipine (NORVASC) 2.5 MG tablet Take 1 tablet (2.5 mg total) by mouth daily.   b complex vitamins capsule Take 1 capsule by mouth daily.   dextromethorphan-guaiFENesin (ROBITUSSIN-DM) 10-100 MG/5ML liquid Take 10 mLs by mouth every 6 (six) hours as needed for cough.   diclofenac Sodium (VOLTAREN) 1 % GEL Apply 4 g topically 4 (four) times daily.   fluticasone (FLONASE) 50 MCG/ACT nasal spray Place into both nostrils daily.   hydrALAZINE (APRESOLINE) 10 MG tablet Take 10 mg by mouth daily as needed.   hydrALAZINE (APRESOLINE) 50 MG tablet Take 50 mg by mouth 4 (four) times daily.   ipratropium-albuterol (DUONEB) 0.5-2.5 (3) MG/3ML SOLN Take 3 mLs by nebulization every 6 (six) hours as needed.   loperamide (IMODIUM A-D) 2 MG tablet Take 2 mg by mouth every 4 (four) hours as needed for diarrhea or loose stools.   magnesium oxide (MAG-OX) 400 (240 Mg) MG tablet Take 400 mg by mouth in the morning, at noon, and at bedtime.   Multiple Vitamins-Iron (MULTI-VITAMIN/IRON PO) Take 1 tablet by mouth daily.   ondansetron (ZOFRAN-ODT) 4 MG disintegrating tablet Take 4 mg by mouth every 4 (four) hours as needed for nausea or vomiting.   PHENobarbital (LUMINAL) 64.8 MG tablet Take 1 tablet (64.8 mg total) by mouth daily. Take 1 tablet every morning   phenytoin (DILANTIN) 100 MG ER capsule Take 200 mg by mouth daily.   Polyethyl Glycol-Propyl Glycol (SYSTANE) 0.4-0.3 % SOLN Place 1 drop into both eyes in the morning and at bedtime.   Probiotic Product (ALIGN) 4 MG CAPS Take 4 mg by mouth daily.    torsemide (DEMADEX) 20 MG tablet Take 1 tablet (20 mg total) by mouth daily.   valsartan (DIOVAN) 160 MG tablet Take 1 tablet (160 mg total) by mouth 2 (two) times daily.   No facility-administered encounter medications on file as of 03/25/2023.    Review of Systems  Constitutional:  Positive for fatigue and unexpected weight change. Negative for activity change, appetite change, chills, diaphoresis and fever.  HENT:  Negative for congestion.   Respiratory:  Negative for cough, shortness of breath and wheezing.   Cardiovascular:  Positive for leg swelling. Negative for chest pain and palpitations.  Gastrointestinal:  Negative for abdominal distention, abdominal pain, constipation and diarrhea.  Genitourinary:  Negative for difficulty urinating and dysuria.  Musculoskeletal:  Positive for gait problem. Negative for  arthralgias, back pain, joint swelling and myalgias.  Neurological:  Negative for dizziness, tremors, seizures, syncope, facial asymmetry, speech difficulty, weakness, light-headedness, numbness and headaches.  Psychiatric/Behavioral:  Negative for agitation, behavioral problems and confusion.        Memory loss    Immunization History  Administered Date(s) Administered   Fluad Quad(high Dose 65+) 04/16/2022   Influenza Split 03/28/2009, 03/17/2010, 03/26/2011, 04/01/2012, 03/05/2013   Influenza, Quadrivalent, Recombinant, Inj, Pf 03/08/2018, 02/13/2019, 02/24/2020   Influenza,inj,Quad PF,6+ Mos 02/25/2014, 03/05/2015   Influenza-Unspecified 02/09/2014, 02/25/2016, 04/06/2017, 04/25/2021   Moderna Sars-Covid-2 Vaccination 07/17/2019, 08/14/2019, 02/24/2020, 04/26/2020   Pneumococcal Conjugate-13 11/26/2014   Pneumococcal Polysaccharide-23 06/23/2003, 11/10/2012   Pneumococcal-Unspecified 06/23/2003, 11/10/2012   Td (Adult), 2 Lf Tetanus Toxid, Preservative Free 06/11/2000, 11/06/2011   Td (Adult),5 Lf Tetanus Toxid, Preservative Free 06/11/2000, 11/06/2011   Unspecified  SARS-COV-2 Vaccination 04/28/2021   Zoster Recombinant(Shingrix) 01/09/2019, 05/08/2019   Zoster, Live 01/15/2006, 01/15/2019   Pertinent  Health Maintenance Due  Topic Date Due   DEXA SCAN  Never done   INFLUENZA VACCINE  01/10/2023      07/19/2022   10:35 AM 07/30/2022    1:44 PM 10/08/2022    2:38 PM 02/06/2023    3:46 PM 03/19/2023   11:25 AM  Fall Risk  Falls in the past year? 1 1 0 0 0  Was there an injury with Fall? 0 0 0 0 0  Fall Risk Category Calculator 2 1 0 0 0  Patient at Risk for Falls Due to  History of fall(s) Impaired balance/gait;Impaired mobility;History of fall(s) History of fall(s);Impaired balance/gait   Fall risk Follow up Falls evaluation completed Falls evaluation completed  Falls evaluation completed;Education provided    Functional Status Survey:    Vitals:   03/25/23 0844  Height: 5\' 5"  (1.651 m)   Body mass index is 23.23 kg/m. Physical Exam Vitals and nursing note reviewed.  Constitutional:      General: She is not in acute distress.    Appearance: She is not diaphoretic.  HENT:     Head: Normocephalic and atraumatic.  Neck:     Vascular: No JVD.  Cardiovascular:     Rate and Rhythm: Normal rate and regular rhythm.     Heart sounds: No murmur heard. Pulmonary:     Effort: Pulmonary effort is normal. No respiratory distress.     Breath sounds: No wheezing.     Comments: Decreased left base Left breast slightly swollen no warmth or redness Abdominal:     General: Bowel sounds are normal. There is no distension.     Palpations: Abdomen is soft.     Tenderness: There is no abdominal tenderness.  Musculoskeletal:     Comments: BLE edema +2   Skin:    General: Skin is warm and dry.  Neurological:     Mental Status: She is alert and oriented to person, place, and time.     Labs reviewed: Recent Labs    04/22/22 1826 04/23/22 0811 04/24/22 0504 04/25/22 0735 04/26/22 0901 08/28/22 1131 09/18/22 0000 12/20/22 1015 12/27/22 1154  01/07/23 0000  NA  --    < > 136 135   < > 141   < > 138 139 133*  K  --    < > 3.7 3.7   < > 5.6*   < > 5.4* 4.9 4.8  4.8  CL  --    < > 103 103   < > 104   < > 103  101 101  101  CO2  --    < > 22 19*   < > 20   < > 21 22 23*  GLUCOSE  --    < > 91 89   < > 95  --  87 98  --   BUN  --    < > 29* 29*   < > 38*   < > 34* 30* 40*  CREATININE  --    < > 1.14* 1.32*   < > 1.20*   < > 1.22* 1.19* 1.1  CALCIUM 8.1*   < > 8.1* 8.4*   < > 9.7   < > 9.9 10.1 8.7  MG 1.1*  --  1.8 1.9  --   --   --   --   --   --   PHOS  --   --  4.4 3.5  --   --   --   --   --   --    < > = values in this interval not displayed.   Recent Labs    04/24/22 0504 04/25/22 0735 04/26/22 0901 05/07/22 0000 06/01/22 0000 07/31/22 0000 09/18/22 0000  AST 24 21 18    < > 30 32 31  ALT 16 15 12    < > 25 23 27   ALKPHOS 52 53 54   < > 77 63 3.9*  BILITOT 0.7 1.1 0.9  --   --   --   --   PROT 5.8* 5.9* 5.9*  --   --   --   --   ALBUMIN 2.8* 2.8* 2.6*   < > 3.5 3.7 3.9   < > = values in this interval not displayed.   Recent Labs    04/24/22 0504 04/25/22 0735 04/26/22 0901 05/07/22 0000 06/01/22 0000 09/18/22 0000 01/08/23 0000  WBC 11.3* 8.9 5.7   < > 6.3 5.2 4.9  NEUTROABS 10.0* 7.6  --   --  5.20  --   --   HGB 9.1* 9.5* 10.2*   < > 10.2* 10.0* 10.5*  HCT 28.1* 29.3* 32.4*   < > 31* 29* 29*  MCV 104.5* 103.5* 106.2*  --   --   --   --   PLT 237 274 283   < > 254 243 223   < > = values in this interval not displayed.   Lab Results  Component Value Date   TSH 4.325 04/25/2022   Lab Results  Component Value Date   HGBA1C 4.9 04/22/2022   Lab Results  Component Value Date   CHOL 208 (H) 01/28/2022   HDL 80 01/28/2022   LDLCALC 116 (H) 01/28/2022   TRIG 59 01/28/2022   CHOLHDL 2.6 01/28/2022    Significant Diagnostic Results in last 30 days:  CUP PACEART INCLINIC DEVICE CHECK  Result Date: 02/27/2023 Pacemaker check in clinic. Patient brought in acutely to clinic today to make changes to  her programming due to persistent AF and noted undersensing of AF burden due to small P waves.  Atrial sensitivity programmed already at 0.87mv.  Device also interrogated today with otherwise normal device function. Multiple AT/AF events noted, AF burden only 66%; however, suspect closer to 100% due to frequent undersensing of fib waves. Patient reports doing well, but has noted ongoing fatigue.  Not a candidate for OAC or watchman.   PROGRAMMING CHANGE MADE: 1.  Mode from DDD 60 to VVIR 60 (rate response turned on due to patient's  complaint of fatigue).Syliva Overman, RN   Assessment/Plan  1. Acute on chronic diastolic (congestive) heart failure (HCC) Change torsemide to 20 mg alternating with 10 mg due to frequency and fatigue with weight loss. Has lost 6 lbs F/U two weeks  2. Primary hypertension Improved with norvasc 5mg   .  3. Left pleural effusion No pain or sob Decreased left base breath sounds other wise clear Continue diuresis.  4. Loose Stool Will reduce mag from tid to bid If continues with loose stools, check cdiff If cdiff is neg then she can use imodium  5. Hypomagnesia Reduce magnesium to bid Check mag level with next lab draw.   Family/ staff Communication: resident    Labs/tests ordered:  CBC BMP add Mg ordered 10/22  Total time :  time greater than 50% of total time spent doing pt counseling and coordination of care

## 2023-03-26 DIAGNOSIS — L821 Other seborrheic keratosis: Secondary | ICD-10-CM | POA: Diagnosis not present

## 2023-03-26 DIAGNOSIS — L723 Sebaceous cyst: Secondary | ICD-10-CM | POA: Diagnosis not present

## 2023-03-26 DIAGNOSIS — H61002 Unspecified perichondritis of left external ear: Secondary | ICD-10-CM | POA: Diagnosis not present

## 2023-03-26 DIAGNOSIS — L720 Epidermal cyst: Secondary | ICD-10-CM | POA: Diagnosis not present

## 2023-03-26 DIAGNOSIS — Z85828 Personal history of other malignant neoplasm of skin: Secondary | ICD-10-CM | POA: Diagnosis not present

## 2023-03-28 DIAGNOSIS — R197 Diarrhea, unspecified: Secondary | ICD-10-CM | POA: Diagnosis not present

## 2023-04-02 ENCOUNTER — Other Ambulatory Visit (HOSPITAL_BASED_OUTPATIENT_CLINIC_OR_DEPARTMENT_OTHER): Payer: Self-pay | Admitting: Internal Medicine

## 2023-04-02 DIAGNOSIS — Z23 Encounter for immunization: Secondary | ICD-10-CM | POA: Diagnosis not present

## 2023-04-02 DIAGNOSIS — J90 Pleural effusion, not elsewhere classified: Secondary | ICD-10-CM

## 2023-04-02 DIAGNOSIS — H02845 Edema of left lower eyelid: Secondary | ICD-10-CM | POA: Diagnosis not present

## 2023-04-02 DIAGNOSIS — R601 Generalized edema: Secondary | ICD-10-CM | POA: Diagnosis not present

## 2023-04-02 LAB — BASIC METABOLIC PANEL
BUN: 45 — AB (ref 4–21)
CO2: 28 — AB (ref 13–22)
Chloride: 101 (ref 99–108)
Creatinine: 1.3 — AB (ref 0.5–1.1)
Glucose: 98
Potassium: 4 meq/L (ref 3.5–5.1)
Sodium: 142 (ref 137–147)

## 2023-04-02 LAB — CBC: RBC: 2.6 — AB (ref 3.87–5.11)

## 2023-04-02 LAB — CBC AND DIFFERENTIAL
HCT: 27 — AB (ref 36–46)
Hemoglobin: 9.3 — AB (ref 12.0–16.0)
Platelets: 207 10*3/uL (ref 150–400)
WBC: 4.4

## 2023-04-02 LAB — COMPREHENSIVE METABOLIC PANEL
Calcium: 9.1 (ref 8.7–10.7)
eGFR: 39

## 2023-04-04 ENCOUNTER — Ambulatory Visit (HOSPITAL_BASED_OUTPATIENT_CLINIC_OR_DEPARTMENT_OTHER)
Admission: RE | Admit: 2023-04-04 | Discharge: 2023-04-04 | Disposition: A | Discharge status: Home / Self Care | Payer: Medicare Other | Source: Ambulatory Visit | Attending: Internal Medicine | Admitting: Internal Medicine

## 2023-04-04 DIAGNOSIS — J90 Pleural effusion, not elsewhere classified: Secondary | ICD-10-CM | POA: Insufficient documentation

## 2023-04-04 DIAGNOSIS — I700 Atherosclerosis of aorta: Secondary | ICD-10-CM | POA: Diagnosis not present

## 2023-04-04 DIAGNOSIS — J449 Chronic obstructive pulmonary disease, unspecified: Secondary | ICD-10-CM | POA: Diagnosis not present

## 2023-04-04 DIAGNOSIS — Z950 Presence of cardiac pacemaker: Secondary | ICD-10-CM | POA: Diagnosis not present

## 2023-04-04 LAB — CBC AND DIFFERENTIAL
HCT: 27 — AB (ref 36–46)
Hemoglobin: 9.3 — AB (ref 12.0–16.0)
Platelets: 207 10*3/uL (ref 150–400)
WBC: 4.4

## 2023-04-04 LAB — BASIC METABOLIC PANEL
BUN: 45 — AB (ref 4–21)
CO2: 28 — AB (ref 13–22)
Chloride: 101 (ref 99–108)
Creatinine: 1.3 — AB (ref 0.5–1.1)
Glucose: 98
Potassium: 4 meq/L (ref 3.5–5.1)
Sodium: 142 (ref 137–147)

## 2023-04-04 LAB — COMPREHENSIVE METABOLIC PANEL: eGFR: 39

## 2023-04-04 LAB — CBC: RBC: 2.6 — AB (ref 3.87–5.11)

## 2023-04-08 ENCOUNTER — Encounter: Payer: Self-pay | Admitting: Adult Health

## 2023-04-08 ENCOUNTER — Non-Acute Institutional Stay: Payer: Medicare Other | Admitting: Adult Health

## 2023-04-08 VITALS — BP 136/64 | HR 72 | Temp 97.8°F | Resp 18 | Ht 65.0 in | Wt 120.0 lb

## 2023-04-08 DIAGNOSIS — I5033 Acute on chronic diastolic (congestive) heart failure: Secondary | ICD-10-CM | POA: Diagnosis not present

## 2023-04-08 DIAGNOSIS — D539 Nutritional anemia, unspecified: Secondary | ICD-10-CM

## 2023-04-08 DIAGNOSIS — R195 Other fecal abnormalities: Secondary | ICD-10-CM | POA: Diagnosis not present

## 2023-04-08 DIAGNOSIS — I10 Essential (primary) hypertension: Secondary | ICD-10-CM | POA: Diagnosis not present

## 2023-04-08 NOTE — Progress Notes (Signed)
Location:  Medical illustrator of Service:  Clinic (12) Provider:   Peggye Ley, ANP [facility] [phone]   Mahlon Gammon, MD  Patient Care Team: Mahlon Gammon, MD as PCP - General (Internal Medicine) [place], [name], MD as PCP - Cardiology (Cardiology) Van Clines, MD as Consulting Physician (Neurology)  Extended Emergency Contact Information Primary Emergency Contact: [name],[name] Mobile Phone: [phone] Relation: Daughter Secondary Emergency Contact: DAVON, SEYDEL Mobile Phone: [phone] Relation: Son  Code Status:  DNR Goals of care: Advanced Directive information    04/08/2023    1:34 PM  Advanced Directives  Does Patient Have a Medical Advance Directive? Yes  Type of Advance Directive Living will;Out of facility DNR (pink MOST or yellow form)  Does patient want to make changes to medical advance directive? Yes (MAU/Ambulatory/Procedural Areas - Information given)     Chief Complaint  Patient presents with   Medical Management of Chronic Issues    Patient is being seen for a follow up    HPI:  Pt is a 87 y.o. female seen today for an acute visit  follow up regarding CHF  PMH: aortic atherosclerosis, cerebral AV malformation, HTN, HLD, Heart block AV-s/p PPM 05/30, MVP, afib, epilepsy, encephalopathy, neuropathy, anemia, and unstable gait.  She is s/p lap chole 04/23/22 and had and post op seizure. S/p PPM placement on 05/30 for CHB  01/27/22 Diagnosed with Pericarditis with Pericardial Effusion, which was treated with colchicine.    Her medtronic device is undersensing with afib, and some adjustments are being made per EP notes 02/27/23.   Seen by Dr Chales Abrahams 03/19/23 diagnosed with acute CHF, Lasix dose increased to 40 mg daily. After a few days there was no change in weight or edema. BP was elevated also and Norvasc started later dose was increased to 5 mg.  On 10/11 the lasix was discontinued and torsemide  started at 20 mg daily.  Dose reduced on 10/14 due to rapid weight loss and fatigue.    Reviewed echo 08/23/22 EF 60-65% with severely elevated pulmonary artery pressure, bilateral atrial enlargement,  Mostly posterior pericardial effusion measuring 1.77 cm. Compared with  the echo 02/2022, the effusion is larger. No evidence of tamponade.  Moderate pericardial effusion. There is no evidence of cardiac tamponade.  Moderate pleural effusion in the left  lateral region.     Afib not on DOAC due to cerebral AV malformation  Update 04/08/23: pt reports that she has lost a lot of weight now her skin is sagging. She feels a lot better with less fatigue. No sob. Edema improved. Lost 19 lbs.  Also she was having some loose stools and magnesium was decreased to bid. Recheck level 1.7.  Stool consistency improved.  BP has been elevated in AL But is WNL here at the clinic.  Past Medical History:  Diagnosis Date   Abnormal cardiovascular stress test    Carotid arterial disease (HCC)    MODERATE RIGHT   Cerebral AV malformation    DDD (degenerative disc disease)    SEVERE WITH SPINAL STENOSIS   Hypercholesterolemia    Hypertension    MVP (mitral valve prolapse)    MILD   Numbness    LEFT FOOT AND ANKLE   Osteopenia    Presence of permanent cardiac pacemaker    Pseudo-gout    Past Surgical History:  Procedure Laterality Date   APPENDECTOMY     BREAST MASS EXCISION     CARPAL  TUNNEL RELEASE     CHOLECYSTECTOMY N/A 04/23/2022   Procedure: LAPAROSCOPIC CHOLECYSTECTOMY;  Surgeon: Abigail Miyamoto, MD;  Location: [facility];  Service: General;  Laterality: N/A;   gallbladder     PACEMAKER IMPLANT N/A 11/07/2021   Procedure: PACEMAKER IMPLANT;  Surgeon: Marinus Maw, MD;  Location: [facility] INVASIVE [facility];  Service: Cardiovascular;  Laterality: N/A;   REMOVAL OF PARATHYROID GLAND     STRESS NUCLEAR STUDY     ABNORMAL   TONSILLECTOMY      Allergies  Allergen Reactions   Aleve [Naproxen]  Other (See Comments)   Azithromycin Other (See Comments)    Reaction not recalled   Penicillins Hives and Other (See Comments)    Bad reaction as a child after a shot of Penicillin   Pravastatin Other (See Comments)    Reaction not recalled   Sulfa Antibiotics Other (See Comments)   Sulfa Drugs Cross Reactors Other (See Comments)    Reaction not recalled- stated she can take it "in small doses"   Tape Itching and Other (See Comments)    Cannot wear for an extended period of time   Naproxen Sodium Rash    Outpatient Encounter Medications as of 04/08/2023  Medication Sig   acetaminophen (TYLENOL) 500 MG tablet Take 500 mg by mouth every 6 (six) hours as needed for mild pain or headache.   amLODipine (NORVASC) 5 MG tablet Take 1 tablet (5 mg total) by mouth daily.   b complex vitamins capsule Take 1 capsule by mouth daily.   dextromethorphan-guaiFENesin (ROBITUSSIN-DM) 10-100 MG/5ML liquid Take 10 mLs by mouth every 6 (six) hours as needed for cough.   diclofenac Sodium (VOLTAREN) 1 % GEL Apply 4 g topically 4 (four) times daily.   fluticasone (FLONASE) 50 MCG/ACT nasal spray Place into both nostrils daily.   hydrALAZINE (APRESOLINE) 10 MG tablet Take 10 mg by mouth daily as needed.   hydrALAZINE (APRESOLINE) 50 MG tablet Take 50 mg by mouth 4 (four) times daily.   ipratropium-albuterol (DUONEB) 0.5-2.5 (3) MG/3ML SOLN Take 3 mLs by nebulization every 6 (six) hours as needed.   loperamide (IMODIUM A-D) 2 MG tablet Take 2 mg by mouth 3 (three) times daily as needed for diarrhea or loose stools.   magnesium oxide (MAG-OX) 400 (240 Mg) MG tablet Take 400 mg by mouth 2 (two) times daily.   Multiple Vitamins-Iron (MULTI-VITAMIN/IRON PO) Take 1 tablet by mouth daily.   ondansetron (ZOFRAN-ODT) 4 MG disintegrating tablet Take 4 mg by mouth every 4 (four) hours as needed for nausea or vomiting.   PHENobarbital (LUMINAL) 64.8 MG tablet Take 1 tablet (64.8 mg total) by mouth daily. Take 1 tablet  every morning   phenytoin (DILANTIN) 100 MG ER capsule Take 200 mg by mouth daily.   Polyethyl Glycol-Propyl Glycol (SYSTANE) 0.4-0.3 % SOLN Place 1 drop into both eyes in the morning and at bedtime.   Probiotic Product (ALIGN) 4 MG CAPS Take 4 mg by mouth daily.   torsemide (DEMADEX) 20 MG tablet Take 0.5 tablets (10 mg total) by mouth every other day. Take 10 mg Tues, Thurs, and Saturday   torsemide (DEMADEX) 20 MG tablet Take 1 tablet (20 mg total) by mouth every other day. Monday Wed, Fri, and SUn   valsartan (DIOVAN) 160 MG tablet Take 1 tablet (160 mg total) by mouth 2 (two) times daily.   No facility-administered encounter medications on file as of 04/08/2023.    Review of Systems  Constitutional:  Negative for  activity change, appetite change, chills, diaphoresis, fatigue, fever and unexpected weight change.  HENT:  Negative for congestion.   Respiratory:  Negative for cough, shortness of breath and wheezing.   Cardiovascular:  Positive for leg swelling. Negative for chest pain and palpitations.  Gastrointestinal:  Negative for abdominal distention, abdominal pain, constipation and diarrhea.  Genitourinary:  Negative for difficulty urinating and dysuria.  Musculoskeletal:  Positive for gait problem. Negative for arthralgias, back pain, joint swelling and myalgias.  Neurological:  Negative for dizziness, tremors, seizures, syncope, facial asymmetry, speech difficulty, weakness, light-headedness, numbness and headaches.  Psychiatric/Behavioral:  Negative for agitation, behavioral problems and confusion.        Memory loss    Immunization History  Administered Date(s) Administered   Fluad Quad(high Dose 65+) 04/16/2022   Fluad Trivalent(High Dose 65+) 04/02/2023   Influenza Split 03/28/2009, 03/17/2010, 03/26/2011, 04/01/2012, 03/05/2013   Influenza, Quadrivalent, Recombinant, Inj, Pf 03/08/2018, 02/13/2019, 02/24/2020   Influenza,inj,Quad PF,6+ Mos 02/25/2014, 03/05/2015    Influenza-Unspecified 02/09/2014, 02/25/2016, 04/06/2017, 04/25/2021   Moderna Covid-19 Vaccine Bivalent Booster 6yrs & up 04/02/2023   Moderna Sars-Covid-2 Vaccination 07/17/2019, 08/14/2019, 02/24/2020, 04/26/2020   Pneumococcal Conjugate-13 11/26/2014   Pneumococcal Polysaccharide-23 06/23/2003, 11/10/2012   Pneumococcal-Unspecified 06/23/2003, 11/10/2012   Td (Adult), 2 Lf Tetanus Toxid, Preservative Free 06/11/2000, 11/06/2011   Td (Adult),5 Lf Tetanus Toxid, Preservative Free 06/11/2000, 11/06/2011   Unspecified SARS-COV-2 Vaccination 04/28/2021   Zoster Recombinant(Shingrix) 01/09/2019, 05/08/2019   Zoster, Live 01/15/2006, 01/15/2019   Pertinent  Health Maintenance Due  Topic Date Due   DEXA SCAN  Never done   INFLUENZA VACCINE  Completed      07/30/2022    1:44 PM 10/08/2022    2:38 PM 02/06/2023    3:46 PM 03/19/2023   11:25 AM 04/08/2023    1:34 PM  Fall Risk  Falls in the past year? 1 0 0 0 0  Was there an injury with Fall? 0 0 0 0 0  Fall Risk Category Calculator 1 0 0 0 0  Patient at Risk for Falls Due to History of fall(s) Impaired balance/gait;Impaired mobility;History of fall(s) History of fall(s);Impaired balance/gait    Fall risk Follow up Falls evaluation completed  Falls evaluation completed;Education provided  Falls evaluation completed   Functional Status Survey:    Vitals:   04/08/23 1328  BP: 136/64  Pulse: 72  Resp: 18  Temp: 97.8 F (36.6 C)  TempSrc: Temporal  SpO2: 98%  Weight: 120 lb (54.4 kg)  Height: 5\' 5"  (1.651 m)    Body mass index is 19.97 kg/m. Weight: 120 lb (54.4 kg)  Wt Readings from Last 3 Encounters:  04/08/23 120 lb (54.4 kg)  03/25/23 132 lb 6.4 oz (60.1 kg)  03/19/23 139 lb 9.6 oz (63.3 kg)    Physical Exam Vitals and nursing note reviewed.  Constitutional:      General: She is not in acute distress.    Appearance: She is not diaphoretic.  HENT:     Head: Normocephalic and atraumatic.  Neck:     Vascular: No JVD.   Cardiovascular:     Rate and Rhythm: Normal rate and regular rhythm.     Heart sounds: No murmur heard. Pulmonary:     Effort: Pulmonary effort is normal. No respiratory distress.     Breath sounds: No wheezing.     Comments: Decreased left base Left breast slightly swollen no warmth or redness Abdominal:     General: Bowel sounds are normal. There is no distension.  Palpations: Abdomen is soft.     Tenderness: There is no abdominal tenderness.  Musculoskeletal:     Comments: BLE edema +2   Skin:    General: Skin is warm and dry.  Neurological:     Mental Status: She is alert and oriented to person, place, and time.     Labs reviewed: Recent Labs    04/22/22 1826 04/23/22 0811 04/24/22 0504 04/25/22 0735 04/26/22 0901 08/28/22 1131 09/18/22 0000 12/20/22 1015 12/27/22 1154 01/07/23 0000 04/02/23 0000  NA  --    < > 136 135   < > 141   < > 138 139 133* 142  K  --    < > 3.7 3.7   < > 5.6*   < > 5.4* 4.9 4.8  4.8 4.0  CL  --    < > 103 103   < > 104   < > 103 101 101  101 101  CO2  --    < > 22 19*   < > 20   < > 21 22 23* 28*  GLUCOSE  --    < > 91 89   < > 95  --  87 98  --   --   BUN  --    < > 29* 29*   < > 38*   < > 34* 30* 40* 45*  CREATININE  --    < > 1.14* 1.32*   < > 1.20*   < > 1.22* 1.19* 1.1 1.3*  CALCIUM 8.1*   < > 8.1* 8.4*   < > 9.7   < > 9.9 10.1 8.7 9.1  MG 1.1*  --  1.8 1.9  --   --   --   --   --   --   --   PHOS  --   --  4.4 3.5  --   --   --   --   --   --   --    < > = values in this interval not displayed.   Recent Labs    04/24/22 0504 04/25/22 0735 04/26/22 0901 05/07/22 0000 06/01/22 0000 07/31/22 0000 09/18/22 0000  AST 24 21 18    < > 30 32 31  ALT 16 15 12    < > 25 23 27   ALKPHOS 52 53 54   < > 77 63 3.9*  BILITOT 0.7 1.1 0.9  --   --   --   --   PROT 5.8* 5.9* 5.9*  --   --   --   --   ALBUMIN 2.8* 2.8* 2.6*   < > 3.5 3.7 3.9   < > = values in this interval not displayed.   Recent Labs    04/24/22 0504  04/25/22 0735 04/26/22 0901 05/07/22 0000 06/01/22 0000 09/18/22 0000 01/08/23 0000 04/02/23 0000  WBC 11.3* 8.9 5.7   < > 6.3 5.2 4.9 4.4  NEUTROABS 10.0* 7.6  --   --  5.20  --   --   --   HGB 9.1* 9.5* 10.2*   < > 10.2* 10.0* 10.5* 9.3*  HCT 28.1* 29.3* 32.4*   < > 31* 29* 29* 27*  MCV 104.5* 103.5* 106.2*  --   --   --   --   --   PLT 237 274 283   < > 254 243 223 207   < > = values in this interval not displayed.   Lab Results  Component Value Date   TSH 4.325 04/25/2022   Lab Results  Component Value Date   HGBA1C 4.9 04/22/2022   Lab Results  Component Value Date   CHOL 208 (H) 01/28/2022   HDL 80 01/28/2022   LDLCALC 116 (H) 01/28/2022   TRIG 59 01/28/2022   CHOLHDL 2.6 01/28/2022    Significant Diagnostic Results in last 30 days:  No results found.  Assessment/Plan  1. Acute on chronic diastolic (congestive) heart failure (HCC) Change torsemide to 20 mg M W F Sun Weight much improved and status much improved.  Daily weights Monitor bp   2. Primary hypertension Intermittently elevated Keep meds the same and use prn  .  3. Left pleural effusion CXR done 10/24 with results pending Lungs are clear with no resp symptoms.   4. Loose Stool Cdiff neg Improved Continue prn imodium  5. Hypomagnesia Continue Mag BID  Mg 1.7 04/02/23  6. Anemia Slight trend down macrocytic Recheck with iron studies and B12   Family/ staff Communication: resident    Labs/tests ordered: TSH  Lipid CBC CMP Iron panel prior to next apt.  F/U with Dr Chales Abrahams in 2 months.   Total time :  time greater than 50% of total time spent doing pt counseling and coordination of care

## 2023-04-11 ENCOUNTER — Other Ambulatory Visit: Payer: Self-pay | Admitting: Adult Health

## 2023-04-11 MED ORDER — PHENOBARBITAL 64.8 MG PO TABS: 64.8000 mg | ORAL_TABLET | Freq: Every day | ORAL | 5 refills | Status: DC

## 2023-05-08 ENCOUNTER — Ambulatory Visit (INDEPENDENT_AMBULATORY_CARE_PROVIDER_SITE_OTHER): Payer: Medicare Other

## 2023-05-08 DIAGNOSIS — I442 Atrioventricular block, complete: Secondary | ICD-10-CM

## 2023-05-08 LAB — CUP PACEART REMOTE DEVICE CHECK
Battery Remaining Longevity: 127 mo
Battery Voltage: 3.03 V
Brady Statistic AP VP Percent: 0 %
Brady Statistic AP VS Percent: 0 %
Brady Statistic AS VP Percent: 97.33 %
Brady Statistic AS VS Percent: 2.67 %
Brady Statistic RA Percent Paced: 26.45 %
Brady Statistic RV Percent Paced: 97.33 %
Date Time Interrogation Session: 20241127004030
Implantable Lead Connection Status: 753985
Implantable Lead Connection Status: 753985
Implantable Lead Implant Date: 20230530
Implantable Lead Implant Date: 20230530
Implantable Lead Location: 753859
Implantable Lead Location: 753860
Implantable Lead Model: 3830
Implantable Lead Model: 5076
Implantable Pulse Generator Implant Date: 20230530
Lead Channel Impedance Value: 190 Ohm
Lead Channel Impedance Value: 323 Ohm
Lead Channel Impedance Value: 437 Ohm
Lead Channel Impedance Value: 475 Ohm
Lead Channel Pacing Threshold Amplitude: 1.125 V
Lead Channel Pacing Threshold Amplitude: 1.375 V
Lead Channel Pacing Threshold Pulse Width: 0.4 ms
Lead Channel Pacing Threshold Pulse Width: 0.4 ms
Lead Channel Sensing Intrinsic Amplitude: 0.125 mV
Lead Channel Sensing Intrinsic Amplitude: 0.25 mV
Lead Channel Sensing Intrinsic Amplitude: 8 mV
Lead Channel Sensing Intrinsic Amplitude: 8 mV
Lead Channel Setting Pacing Amplitude: 2.25 V
Lead Channel Setting Pacing Pulse Width: 0.4 ms
Lead Channel Setting Sensing Sensitivity: 1.2 mV
Zone Setting Status: 755011

## 2023-05-14 ENCOUNTER — Encounter: Payer: BLUE CROSS/BLUE SHIELD | Admitting: Internal Medicine

## 2023-06-10 ENCOUNTER — Encounter: Payer: Self-pay | Admitting: Adult Health

## 2023-06-10 ENCOUNTER — Non-Acute Institutional Stay: Payer: Medicare Other | Admitting: Adult Health

## 2023-06-10 DIAGNOSIS — I10 Essential (primary) hypertension: Secondary | ICD-10-CM

## 2023-06-10 DIAGNOSIS — R635 Abnormal weight gain: Secondary | ICD-10-CM | POA: Diagnosis not present

## 2023-06-10 DIAGNOSIS — K529 Noninfective gastroenteritis and colitis, unspecified: Secondary | ICD-10-CM

## 2023-06-10 MED ORDER — TORSEMIDE 20 MG PO TABS: 20.0000 mg | ORAL_TABLET | Freq: Every day | ORAL | Status: DC

## 2023-06-10 NOTE — Progress Notes (Signed)
Location:  [facility] Room Number: [phone] Place of Service:  ALF [phone]) Provider: Tamsen Roers, MD  Patient Care Team: Mahlon Gammon, MD as PCP - General (Internal Medicine) [place], [name], MD as PCP - Cardiology (Cardiology) Van Clines, MD as Consulting Physician (Neurology)  Extended Emergency Contact Information Primary Emergency Contact: [name],[name] Mobile Phone: [phone] Relation: Daughter Secondary Emergency Contact: ALYXIA, CENTEIO Mobile Phone: [phone] Relation: Son  Code Status:  DNR Goals of care: Advanced Directive information    06/10/2023   10:46 AM  Advanced Directives  Does Patient Have a Medical Advance Directive? Yes  Type of Advance Directive Living will;Out of facility DNR (pink MOST or yellow form)  Does patient want to make changes to medical advance directive? No - Patient declined  Pre-existing out of facility DNR order (yellow form or pink MOST form) Yellow form placed in chart (order not valid for inpatient use)     Chief Complaint  Patient presents with   Acute Visit    Patient is being seen for acute weight gain    Health Maintenance    Patient is due for a bone density     HPI:  Pt is a 87 y.o. female seen today for an acute visit for weight gain 5 lbs in the past week. She also has swelling of the face and legs.  She denies sob, doe, or chest pain  Also reports a soft stool with each BM not formed but not watery. No abd pain. No fever. Has had  intermittent loose stools before.  PMH: aortic atherosclerosis, cerebral AV malformation, HTN, HLD, Heart block AV-s/p PPM 05/30, MVP, afib, epilepsy, encephalopathy, neuropathy, anemia, and unstable gait.  She is s/p lap chole 04/23/22 and had and post op seizure. S/p PPM placement on 05/30 for CHB  01/27/22 Diagnosed with Pericarditis with Pericardial Effusion, which was treated with colchicine.   Her medtronic device is  undersensing with afib, and some adjustments are being made per EP notes 02/27/23.   Echo 08/23/22 EF 60-65% with severely elevated pulmonary artery pressure, bilateral atrial enlargement,  Mostly posterior pericardial effusion measuring 1.77 cm. Compared with  the echo 02/2022, the effusion is larger. No evidence of tamponade.  Moderate pericardial effusion. There is no evidence of cardiac tamponade.  Moderate pleural effusion in the left  lateral region.   BP has been elevated systolic 150-170/40-60s Past Medical History:  Diagnosis Date   Abnormal cardiovascular stress test    Carotid arterial disease (HCC)    MODERATE RIGHT   Cerebral AV malformation    DDD (degenerative disc disease)    SEVERE WITH SPINAL STENOSIS   Hypercholesterolemia    Hypertension    MVP (mitral valve prolapse)    MILD   Numbness    LEFT FOOT AND ANKLE   Osteopenia    Presence of permanent cardiac pacemaker    Pseudo-gout    Past Surgical History:  Procedure Laterality Date   APPENDECTOMY     BREAST MASS EXCISION     CARPAL TUNNEL RELEASE     CHOLECYSTECTOMY N/A 04/23/2022   Procedure: LAPAROSCOPIC CHOLECYSTECTOMY;  Surgeon: Abigail Miyamoto, MD;  Location: [facility];  Service: General;  Laterality: N/A;   gallbladder     PACEMAKER IMPLANT N/A 11/07/2021   Procedure: PACEMAKER IMPLANT;  Surgeon: Marinus Maw, MD;  Location: [facility];  Service: Cardiovascular;  Laterality: N/A;   REMOVAL OF PARATHYROID GLAND  STRESS NUCLEAR STUDY     ABNORMAL   TONSILLECTOMY      Allergies  Allergen Reactions   Aleve [Naproxen] Other (See Comments)   Azithromycin Other (See Comments)    Reaction not recalled   Penicillins Hives and Other (See Comments)    Bad reaction as a child after a shot of Penicillin   Pravastatin Other (See Comments)    Reaction not recalled   Sulfa Antibiotics Other (See Comments)   Sulfa Drugs Cross Reactors Other (See Comments)    Reaction not recalled- stated she  can take it "in small doses"   Tape Itching and Other (See Comments)    Cannot wear for an extended period of time   Naproxen Sodium Rash    Outpatient Encounter Medications as of 06/10/2023  Medication Sig   acetaminophen (TYLENOL) 500 MG tablet Take 500 mg by mouth every 6 (six) hours as needed for mild pain or headache.   amLODipine (NORVASC) 5 MG tablet Take 1 tablet (5 mg total) by mouth daily.   b complex vitamins capsule Take 1 capsule by mouth daily.   dextromethorphan-guaiFENesin (ROBITUSSIN-DM) 10-100 MG/5ML liquid Take 10 mLs by mouth every 6 (six) hours as needed for cough.   fluticasone (FLONASE) 50 MCG/ACT nasal spray Place into both nostrils daily.   hydrALAZINE (APRESOLINE) 10 MG tablet Take 10 mg by mouth daily as needed.   hydrALAZINE (APRESOLINE) 50 MG tablet Take 50 mg by mouth 4 (four) times daily.   ipratropium-albuterol (DUONEB) 0.5-2.5 (3) MG/3ML SOLN Take 3 mLs by nebulization every 6 (six) hours as needed.   loperamide (IMODIUM A-D) 2 MG tablet Take 2 mg by mouth 3 (three) times daily as needed for diarrhea or loose stools.   magnesium oxide (MAG-OX) 400 (240 Mg) MG tablet Take 400 mg by mouth 2 (two) times daily.   Multiple Vitamins-Iron (MULTI-VITAMIN/IRON PO) Take 1 tablet by mouth daily.   ondansetron (ZOFRAN-ODT) 4 MG disintegrating tablet Take 4 mg by mouth every 4 (four) hours as needed for nausea or vomiting.   PHENobarbital (LUMINAL) 64.8 MG tablet Take 1 tablet (64.8 mg total) by mouth daily. Take 1 tablet every morning   phenytoin (DILANTIN) 100 MG ER capsule Take 200 mg by mouth daily.   Polyethyl Glycol-Propyl Glycol (SYSTANE) 0.4-0.3 % SOLN Place 1 drop into both eyes in the morning and at bedtime.   Probiotic Product (ALIGN) 4 MG CAPS Take 4 mg by mouth daily.   valsartan (DIOVAN) 160 MG tablet Take 1 tablet (160 mg total) by mouth 2 (two) times daily.   [DISCONTINUED] amLODipine (NORVASC) 2.5 MG tablet Take 2.5 mg by mouth daily.   [DISCONTINUED]  torsemide (DEMADEX) 20 MG tablet Take 1 tablet (20 mg total) by mouth every other day. Monday Wed, Fri, and SUn   torsemide (DEMADEX) 20 MG tablet Take 1 tablet (20 mg total) by mouth daily. Monday Wed, Fri, and SUn   [DISCONTINUED] diclofenac Sodium (VOLTAREN) 1 % GEL Apply 4 g topically 4 (four) times daily. (Patient not taking: Reported on 06/10/2023)   No facility-administered encounter medications on file as of 06/10/2023.    Review of Systems  Constitutional:  Positive for unexpected weight change. Negative for activity change, appetite change, chills, diaphoresis, fatigue and fever.  HENT:  Negative for congestion and sore throat.   Respiratory:  Negative for cough, shortness of breath and wheezing.   Cardiovascular:  Positive for leg swelling. Negative for chest pain and palpitations.  Gastrointestinal:  Negative for abdominal  distention, abdominal pain, constipation, diarrhea, nausea, rectal pain and vomiting.       Soft stool with each urination  Genitourinary:  Negative for difficulty urinating and dysuria.  Musculoskeletal:  Positive for arthralgias and gait problem. Negative for back pain, joint swelling and myalgias.  Neurological:  Negative for dizziness, tremors, seizures, syncope, facial asymmetry, speech difficulty, weakness, light-headedness, numbness and headaches.  Psychiatric/Behavioral:  Negative for agitation, behavioral problems and confusion.     Immunization History  Administered Date(s) Administered   Fluad Quad(high Dose 65+) 04/16/2022   Fluad Trivalent(High Dose 65+) 04/02/2023   Influenza Split 03/28/2009, 03/17/2010, 03/26/2011, 04/01/2012, 03/05/2013   Influenza, Quadrivalent, Recombinant, Inj, Pf 03/08/2018, 02/13/2019, 02/24/2020   Influenza,inj,Quad PF,6+ Mos 02/25/2014, 03/05/2015   Influenza-Unspecified 02/09/2014, 02/25/2016, 04/06/2017, 04/25/2021   Moderna Covid-19 Vaccine Bivalent Booster 7yrs & up 04/02/2023   Moderna Sars-Covid-2 Vaccination  07/17/2019, 08/14/2019, 02/24/2020, 04/26/2020   Pneumococcal Conjugate-13 11/26/2014   Pneumococcal Polysaccharide-23 06/23/2003, 11/10/2012   Pneumococcal-Unspecified 06/23/2003, 11/10/2012   Td (Adult), 2 Lf Tetanus Toxid, Preservative Free 06/11/2000, 11/06/2011   Td (Adult),5 Lf Tetanus Toxid, Preservative Free 06/11/2000, 11/06/2011   Unspecified SARS-COV-2 Vaccination 04/28/2021   Zoster Recombinant(Shingrix) 01/09/2019, 05/08/2019   Zoster, Live 01/15/2006, 01/15/2019   Pertinent  Health Maintenance Due  Topic Date Due   DEXA SCAN  Never done   INFLUENZA VACCINE  Completed      07/30/2022    1:44 PM 10/08/2022    2:38 PM 02/06/2023    3:46 PM 03/19/2023   11:25 AM 04/08/2023    1:34 PM  Fall Risk  Falls in the past year? 1 0 0 0 0  Was there an injury with Fall? 0 0 0 0 0  Fall Risk Category Calculator 1 0 0 0 0  Patient at Risk for Falls Due to History of fall(s) Impaired balance/gait;Impaired mobility;History of fall(s) History of fall(s);Impaired balance/gait    Fall risk Follow up Falls evaluation completed  Falls evaluation completed;Education provided  Falls evaluation completed   Functional Status Survey:    Vitals:   06/10/23 1034  BP: (!) 164/47  Pulse: 65  Resp: 18  Temp: 97.8 F (36.6 C)  TempSrc: Temporal  SpO2: 97%  Weight: 121 lb 9.6 oz (55.2 kg)  Height: 5\' 5"  (1.651 m)   Body mass index is 20.24 kg/m. Physical Exam Vitals and nursing note reviewed.  Constitutional:      General: She is not in acute distress.    Appearance: She is not diaphoretic.  HENT:     Head: Normocephalic and atraumatic.     Mouth/Throat:     Mouth: Mucous membranes are moist.     Pharynx: Oropharynx is clear.  Neck:     Vascular: No JVD.  Cardiovascular:     Rate and Rhythm: Normal rate and regular rhythm.     Heart sounds: No murmur heard. Pulmonary:     Effort: Pulmonary effort is normal. No respiratory distress.     Breath sounds: Normal breath sounds. No  wheezing.  Abdominal:     General: Abdomen is flat. Bowel sounds are normal. There is no distension.     Palpations: Abdomen is soft.     Tenderness: There is no abdominal tenderness.  Musculoskeletal:     Comments: BLE edema +2 Facial edema on the right side and edema to eyes.   Skin:    General: Skin is warm and dry.  Neurological:     Mental Status: She is alert and oriented to  person, place, and time.  Psychiatric:        Mood and Affect: Mood normal.     Labs reviewed: Recent Labs    08/28/22 1131 09/18/22 0000 12/20/22 1015 12/27/22 1154 01/07/23 0000 04/02/23 0000  NA 141   < > 138 139 133* 142  K 5.6*   < > 5.4* 4.9 4.8  4.8 4.0  CL 104   < > 103 101 101  101 101  CO2 20   < > 21 22 23* 28*  GLUCOSE 95  --  87 98  --   --   BUN 38*   < > 34* 30* 40* 45*  CREATININE 1.20*   < > 1.22* 1.19* 1.1 1.3*  CALCIUM 9.7   < > 9.9 10.1 8.7 9.1   < > = values in this interval not displayed.   Recent Labs    07/31/22 0000 09/18/22 0000  AST 32 31  ALT 23 27  ALKPHOS 63 3.9*  ALBUMIN 3.7 3.9   Recent Labs    09/18/22 0000 01/08/23 0000 04/02/23 0000  WBC 5.2 4.9 4.4  HGB 10.0* 10.5* 9.3*  HCT 29* 29* 27*  PLT 243 223 207   Lab Results  Component Value Date   TSH 4.325 04/25/2022   Lab Results  Component Value Date   HGBA1C 4.9 04/22/2022   Lab Results  Component Value Date   CHOL 208 (H) 01/28/2022   HDL 80 01/28/2022   LDLCALC 116 (H) 01/28/2022   TRIG 59 01/28/2022   CHOLHDL 2.6 01/28/2022    Significant Diagnostic Results in last 30 days:  No results found.  Assessment/Plan  1. Weight gain (Primary) Likely due to CHF with fluid retention Increase torsemide to 20 mg daily Ordered f/u CBC BMP  2. Primary hypertension Above goal Increasing torsemide Has f/u 06/17/22  3. Frequent stools No watery stools, just soft with each urination.  On magnesium for low mag and arrhythmia control Can use imodium prn, f/u if not improving.    Family/ staff Communication: nurse  Labs/tests ordered:  labs already ordered 06/12/22  Total time :  time greater than 50% of total time spent doing pt counseling and coordination of care

## 2023-06-13 DIAGNOSIS — D509 Iron deficiency anemia, unspecified: Secondary | ICD-10-CM | POA: Diagnosis not present

## 2023-06-13 DIAGNOSIS — I5020 Unspecified systolic (congestive) heart failure: Secondary | ICD-10-CM | POA: Diagnosis not present

## 2023-06-13 LAB — BASIC METABOLIC PANEL
BUN: 62 — AB (ref 4–21)
CO2: 24 — AB (ref 13–22)
Chloride: 101 (ref 99–108)
Creatinine: 1.4 — AB (ref 0.5–1.1)
Glucose: 100
Potassium: 4.3 meq/L (ref 3.5–5.1)
Sodium: 131 — AB (ref 137–147)

## 2023-06-13 LAB — COMPREHENSIVE METABOLIC PANEL
Albumin: 3.5 (ref 3.5–5.0)
Calcium: 9.1 (ref 8.7–10.7)
Globulin: 2.2
eGFR: 36

## 2023-06-13 LAB — LIPID PANEL
HDL: 97 — AB (ref 35–70)
LDL Cholesterol: 97
LDl/HDL Ratio: 2.1
Triglycerides: 51 (ref 40–160)

## 2023-06-13 LAB — HEPATIC FUNCTION PANEL
ALT: 21 U/L (ref 7–35)
AST: 30 (ref 13–35)
Alkaline Phosphatase: 74 (ref 25–125)
Bilirubin, Total: 0.2

## 2023-06-13 LAB — HEMOGLOBIN A1C: Hemoglobin A1C: 204

## 2023-06-18 ENCOUNTER — Encounter: Payer: Self-pay | Admitting: Internal Medicine

## 2023-06-18 ENCOUNTER — Non-Acute Institutional Stay: Payer: Medicare Other | Admitting: Internal Medicine

## 2023-06-18 VITALS — BP 130/80 | HR 67 | Temp 96.9°F | Ht 65.0 in | Wt 122.2 lb

## 2023-06-18 DIAGNOSIS — I5033 Acute on chronic diastolic (congestive) heart failure: Secondary | ICD-10-CM

## 2023-06-18 DIAGNOSIS — I10 Essential (primary) hypertension: Secondary | ICD-10-CM

## 2023-06-18 DIAGNOSIS — D631 Anemia in chronic kidney disease: Secondary | ICD-10-CM

## 2023-06-18 DIAGNOSIS — N1831 Chronic kidney disease, stage 3a: Secondary | ICD-10-CM | POA: Diagnosis not present

## 2023-06-18 NOTE — Progress Notes (Signed)
 Location:  [facility] of Service:  Clinic (12)  Provider:   Code Status: DNR Goals of Care:     06/10/2023   10:46 AM  Advanced Directives  Does Patient Have a Medical Advance Directive? Yes  Type of Advance Directive Living will;Out of facility DNR (pink MOST or yellow form)  Does patient want to make changes to medical advance directive? No - Patient declined  Pre-existing out of facility DNR order (yellow form or pink MOST form) Yellow form placed in chart (order not valid for inpatient use)     Chief Complaint  Patient presents with   Medical Management of Chronic Issues    Patient presents today for a 3 month follow-up   Quality Metric Gaps    AWV, DEXA scan    HPI: Patient is a 88 y.o. female seen today for medical management of chronic diseases.    She is in AL in [place]   Has history of chronic diarrhea and Previous h/o C Diff colitis S/p PPM placement on 05/30 for CHB  A Fib Amiodarone  tried but then taken off due to GI side effects Not on anticoagulation due to cerebral AVMs.  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 teen Had a work-up at [facility] and has been on Dilantin  since then History of unhealed right humerus shaft fracture Peripheral neuropathy  Was seen by Chrsity for increased weight gain  Her BUN and Creat got elevated on higher doses and it was again reduced to   3/week  She also continues to have issues with feeling fatigue and tired  Also gets SOB easily    Past Medical History:  Diagnosis Date   Abnormal cardiovascular stress test    Carotid arterial disease (HCC)    MODERATE RIGHT   Cerebral AV malformation    DDD (degenerative disc disease)    SEVERE WITH SPINAL STENOSIS   Hypercholesterolemia    Hypertension    MVP (mitral valve prolapse)    MILD   Numbness    LEFT FOOT AND ANKLE   Osteopenia    Presence of permanent  cardiac pacemaker    Pseudo-gout     Past Surgical History:  Procedure Laterality Date   APPENDECTOMY     BREAST MASS EXCISION     CARPAL TUNNEL RELEASE     CHOLECYSTECTOMY N/A 04/23/2022   Procedure: LAPAROSCOPIC CHOLECYSTECTOMY;  Surgeon: Vernetta Berg, MD;  Location: [facility];  Service: General;  Laterality: N/A;   gallbladder     PACEMAKER IMPLANT N/A 11/07/2021   Procedure: PACEMAKER IMPLANT;  Surgeon: Waddell Danelle ORN, MD;  Location: [facility];  Service: Cardiovascular;  Laterality: N/A;   REMOVAL OF PARATHYROID  GLAND     STRESS NUCLEAR STUDY     ABNORMAL   TONSILLECTOMY      Allergies  Allergen Reactions   Aleve [Naproxen] Other (See Comments)   Azithromycin Other (See Comments)    Reaction not recalled   Penicillins Hives and Other (See Comments)    Bad reaction as a child after a shot of Penicillin   Pravastatin Other (See Comments)    Reaction not recalled   Sulfa Antibiotics Other (See Comments)   Sulfa Drugs Cross Reactors Other (See Comments)    Reaction not recalled- stated she can take it in small doses   Tape Itching and Other (See Comments)    Cannot wear for an  extended period of time   Naproxen Sodium Rash    Outpatient Encounter Medications as of 06/18/2023  Medication Sig   acetaminophen  (TYLENOL ) 500 MG tablet Take 500 mg by mouth every 6 (six) hours as needed for mild pain or headache.   amLODipine  (NORVASC ) 5 MG tablet Take 1 tablet (5 mg total) by mouth daily.   b complex vitamins capsule Take 1 capsule by mouth daily.   dextromethorphan-guaiFENesin  (ROBITUSSIN-DM) 10-100 MG/5ML liquid Take 10 mLs by mouth every 6 (six) hours as needed for cough.   fluticasone  (FLONASE ) 50 MCG/ACT nasal spray Place into both nostrils daily.   hydrALAZINE  (APRESOLINE ) 10 MG tablet Take 10 mg by mouth daily as needed.   hydrALAZINE  (APRESOLINE ) 50 MG tablet Take 50 mg by mouth 4 (four) times daily.   ipratropium-albuterol  (DUONEB) 0.5-2.5 (3) MG/3ML SOLN  Take 3 mLs by nebulization every 6 (six) hours as needed.   loperamide  (IMODIUM  A-D) 2 MG tablet Take 2 mg by mouth 3 (three) times daily as needed for diarrhea or loose stools.   magnesium  oxide (MAG-OX) 400 (240 Mg) MG tablet Take 400 mg by mouth 2 (two) times daily.   Multiple Vitamins-Iron  (MULTI-VITAMIN/IRON  PO) Take 1 tablet by mouth daily.   ondansetron  (ZOFRAN -ODT) 4 MG disintegrating tablet Take 4 mg by mouth every 4 (four) hours as needed for nausea or vomiting.   PHENobarbital  (LUMINAL) 64.8 MG tablet Take 1 tablet (64.8 mg total) by mouth daily. Take 1 tablet every morning   phenytoin  (DILANTIN ) 100 MG ER capsule Take 200 mg by mouth daily.   Polyethyl Glycol-Propyl Glycol (SYSTANE) 0.4-0.3 % SOLN Place 1 drop into both eyes in the morning and at bedtime.   Probiotic Product (ALIGN) 4 MG CAPS Take 4 mg by mouth daily.   torsemide  (DEMADEX ) 20 MG tablet Take 1 tablet (20 mg total) by mouth daily.   valsartan  (DIOVAN ) 160 MG tablet Take 1 tablet (160 mg total) by mouth 2 (two) times daily.   No facility-administered encounter medications on file as of 06/18/2023.    Review of Systems:  Review of Systems  Constitutional:  Positive for activity change. Negative for appetite change.  HENT: Negative.    Respiratory:  Positive for shortness of breath. Negative for cough.   Cardiovascular:  Negative for leg swelling.  Gastrointestinal:  Negative for constipation.  Genitourinary: Negative.   Musculoskeletal:  Positive for gait problem. Negative for arthralgias and myalgias.  Skin: Negative.   Neurological:  Positive for weakness. Negative for dizziness.  Psychiatric/Behavioral:  Negative for confusion, dysphoric mood and sleep disturbance.     Health Maintenance  Topic Date Due   DEXA SCAN  Never done   Medicare Annual Wellness (AWV)  03/20/2023   COVID-19 Vaccine (7 - 2024-25 season) 06/26/2023 (Originally 05/28/2023)   Pneumonia Vaccine 13+ Years old  Completed   INFLUENZA  VACCINE  Completed   Zoster Vaccines- Shingrix  Completed   HPV VACCINES  Aged Out   DTaP/Tdap/Td  Discontinued    Physical Exam: Vitals:   06/18/23 1351  BP: 130/80  Pulse: 67  Temp: (!) 96.9 F (36.1 C)  SpO2: 95%  Weight: 122 lb 3.2 oz (55.4 kg)  Height: 5' 5 (1.651 m)   Body mass index is 20.34 kg/m. Physical Exam Vitals reviewed.  Constitutional:      Appearance: Normal appearance.  HENT:     Head: Normocephalic.     Nose: Nose normal.     Mouth/Throat:     Mouth: Mucous  membranes are moist.     Pharynx: Oropharynx is clear.  Eyes:     Pupils: Pupils are equal, round, and reactive to light.  Cardiovascular:     Rate and Rhythm: Normal rate and regular rhythm.     Pulses: Normal pulses.     Heart sounds: Normal heart sounds. No murmur heard. Pulmonary:     Effort: Pulmonary effort is normal.     Breath sounds: Normal breath sounds.  Abdominal:     General: Abdomen is flat. Bowel sounds are normal.     Palpations: Abdomen is soft.  Musculoskeletal:        General: No swelling.     Cervical back: Neck supple.  Skin:    General: Skin is warm.  Neurological:     General: No focal deficit present.     Mental Status: She is alert and oriented to person, place, and time.  Psychiatric:        Mood and Affect: Mood normal.        Thought Content: Thought content normal.     Labs reviewed: Basic Metabolic Panel: Recent Labs    08/28/22 1131 09/18/22 0000 12/20/22 1015 12/27/22 1154 01/07/23 0000 04/02/23 0000  NA 141   < > 138 139 133* 142  K 5.6*   < > 5.4* 4.9 4.8  4.8 4.0  CL 104   < > 103 101 101  101 101  CO2 20   < > 21 22 23* 28*  GLUCOSE 95  --  87 98  --   --   BUN 38*   < > 34* 30* 40* 45*  CREATININE 1.20*   < > 1.22* 1.19* 1.1 1.3*  CALCIUM  9.7   < > 9.9 10.1 8.7 9.1   < > = values in this interval not displayed.   Liver Function Tests: Recent Labs    07/31/22 0000 09/18/22 0000  AST 32 31  ALT 23 27  ALKPHOS 63 3.9*  ALBUMIN  3.7 3.9   No results for input(s): LIPASE, AMYLASE in the last 8760 hours. No results for input(s): AMMONIA in the last 8760 hours. CBC: Recent Labs    09/18/22 0000 01/08/23 0000 04/02/23 0000  WBC 5.2 4.9 4.4  HGB 10.0* 10.5* 9.3*  HCT 29* 29* 27*  PLT 243 223 207   Lipid Panel: No results for input(s): CHOL, HDL, LDLCALC, TRIG, CHOLHDL, LDLDIRECT in the last 8760 hours. Lab Results  Component Value Date   HGBA1C 4.9 04/22/2022    Procedures since last visit: No results found.  Assessment/Plan 1. Anemia due to stage 3a chronic kidney disease (HCC) (Primary) Check Stool for Occult  Addendum They are negative so far B 12 is normal Will repeat labs with Iron  Studies Possible Hematology referral if Needed  2. Primary hypertension On Valsartan  and Norvasc   3. Acute on chronic diastolic (congestive) heart failure (HCC) Torsemide  was changed to every day but now back on 3/week    Labs/tests ordered:   Next appt:  Visit date not found

## 2023-06-20 DIAGNOSIS — N189 Chronic kidney disease, unspecified: Secondary | ICD-10-CM | POA: Diagnosis not present

## 2023-06-20 LAB — CBC: RBC: 2.4 — AB (ref 3.87–5.11)

## 2023-06-20 LAB — BASIC METABOLIC PANEL
BUN: 57 — AB (ref 4–21)
CO2: 24 — AB (ref 13–22)
Chloride: 104 (ref 99–108)
Creatinine: 1.5 — AB (ref 0.5–1.1)
Glucose: 97
Potassium: 4.3 meq/L (ref 3.5–5.1)
Sodium: 141 (ref 137–147)

## 2023-06-20 LAB — CBC AND DIFFERENTIAL
HCT: 24 — AB (ref 36–46)
Hemoglobin: 8.4 — AB (ref 12.0–16.0)
Platelets: 191 10*3/uL (ref 150–400)
WBC: 4.6

## 2023-06-20 LAB — COMPREHENSIVE METABOLIC PANEL
Calcium: 9.4 (ref 8.7–10.7)
eGFR: 35

## 2023-06-20 LAB — VITAMIN B12: Vitamin B-12: 700

## 2023-06-22 NOTE — Progress Notes (Signed)
 Office Visit    Patient Name: [name] Date of Encounter: 06/26/2023  Primary Care Provider:  Marguerite Shiley, MD Primary Cardiologist:  Sueellen Kayes [place], MD  Chief Complaint    88 year old female with a history of recurrent syncope (Stokes-Adams attack), complete heart block s/p PPM, RBBB, paroxysmal atrial fibrillation, mitral valve prolapse, hypertension, hyperlipidemia, carotid artery disease, DDD, cerebral AV malformation, complex partial seizures, memory impairment, iron  deficiency anemia, and osteopenia who presents for follow-up related to heart block, hypertension, and atrial fibrillation. She resides at [facility]. Noted BP there typically 140-150 systolic. Much better today. No increase in SOB. No chest pain. No edema. Denies any dizziness or palpitations. Pacer check Nov 27 was normal.   Past Medical History    Past Medical History:  Diagnosis Date   Abnormal cardiovascular stress test    Carotid arterial disease (HCC)    MODERATE RIGHT   Cerebral AV malformation    DDD (degenerative disc disease)    SEVERE WITH SPINAL STENOSIS   Hypercholesterolemia    Hypertension    MVP (mitral valve prolapse)    MILD   Numbness    LEFT FOOT AND ANKLE   Osteopenia    Presence of permanent cardiac pacemaker    Pseudo-gout    Past Surgical History:  Procedure Laterality Date   APPENDECTOMY     BREAST MASS EXCISION     CARPAL TUNNEL RELEASE     CHOLECYSTECTOMY N/A 04/23/2022   Procedure: LAPAROSCOPIC CHOLECYSTECTOMY;  Surgeon: Oza Blumenthal, MD;  Location: [facility];  Service: General;  Laterality: N/A;   gallbladder     PACEMAKER IMPLANT N/A 11/07/2021   Procedure: PACEMAKER IMPLANT;  Surgeon: Tammie Fall, MD;  Location: [facility];  Service: Cardiovascular;  Laterality: N/A;   REMOVAL OF PARATHYROID  GLAND     STRESS NUCLEAR STUDY     ABNORMAL   TONSILLECTOMY      Allergies  Allergies  Allergen Reactions   Aleve [Naproxen] Other (See Comments)    Azithromycin Other (See Comments)    Reaction not recalled   Penicillins Hives and Other (See Comments)    Bad reaction as a child after a shot of Penicillin   Pravastatin Other (See Comments)    Reaction not recalled   Sulfa Antibiotics Other (See Comments)   Sulfa Drugs Cross Reactors Other (See Comments)    Reaction not recalled- stated she can take it "in small doses"   Tape Itching and Other (See Comments)    Cannot wear for an extended period of time   Naproxen Sodium Rash    History of Present Illness    88 year old female with the above past medical history including recurrent syncope (Stokes-Adams attack), complete heart block s/p PPM, RBBB, paroxysmal atrial fibrillation, mitral valve prolapse, hypertension, hyperlipidemia, carotid artery disease, DDD, cerebral AV malformation, complex partial seizures, memory impairment, iron  deficiency anemia, and osteopenia.  She has a history of mildly abnormal Myoview  study in 2010.  Not on ASA due to history of cerebral AV malformation.  She lives at [facility].  She history of recurrent syncope.  She was hospitalized in February 2022 with syncope, confusion, hyponatremia following diarrheal illness.  Carotid Dopplers in July 2022 showed 40 to 59% B ICA stenosis, stable from prior study.   She presented to the ED on 10/15/2021 with syncope.  Echocardiogram showed EF 45 to 50%, LVH, normal RV, moderately dilated left, mild MR, pericardial effusion.  Telemetry monitoring showed frequent PVCs.  Radiology and neurology were consulted.  EEG was negative for seizure activity, UA was negative for UTI.  It was mildly elevated with low suspicion for ACS.  She was hospitalized from 10/15/2021 to 10/17/2021.  She was discharged to assisted living with outpatient 14-day live Zio patch revealed intermittent CHB, says lasting up to 10 seconds.  She was last in the office on 11/03/2021 by Dr. Carolynne Citron who recommended PPM for complete heart block.  She  underwent PPM implantation on 11/07/2021 with Dr. Carolynne Citron.  She was discharged to skilled nursing facility on 11/07/2021.  The facility notified us  of low-grade fevers over the weekend following her procedure, left arm swelling.  Her staff nurse, PPM was without signs of infection. PPM remote transmission on 11/14/2021 alerted for new onset atrial fibrillation. Per Dr. Carolynne Citron, she was to be referred to the atrial fibrillation clinic.  When seen in Afib clinic in July she was doing well. Not a candidate for anticoagulation due to cerebral AVMs. Also not a candidate for Watchman device since she cannot take any antiplatelet or anticoagulants. She was recently admitted 8/19 with pleuritic chest pain. Ruled out for MI. Coronary CTA showed nonobstructive CAD. There was a moderate pericardial effusion noted and this was confirmed on Echo. Normal pacemaker parameters suggested no lead migration. She was treated for pericarditis with colchicine .   She was seen by Dr Carolynne Citron EPS in September 2023 who recommended a trial of amiodarone  for her Afib.  She noted persistent nausea, diarrhea and poor appetite. Amiodarone  was discontinued and she was managed with rate control.  She underwent lap chole on 04/23/2022. Postoperatively she was noted to have a seizure. She reported she had follow-up with neurology planned.   She was seen by Lawana Pray in March with LE edema. Echo repeated and showed severe biatrial enlargement, severe pulmonary HTN and moderate pericardial effusion. She was managed with lasix , compression hose and treated with a course of colchicine . She was seen in July and clinically improved.   Home Medications    Current Outpatient Medications  Medication Sig Dispense Refill   acetaminophen  (TYLENOL ) 500 MG tablet Take 500 mg by mouth every 6 (six) hours as needed for mild pain or headache.     amLODipine  (NORVASC ) 5 MG tablet Take 1 tablet (5 mg total) by mouth daily.     b complex vitamins capsule  Take 1 capsule by mouth daily.     dextromethorphan-guaiFENesin  (ROBITUSSIN-DM) 10-100 MG/5ML liquid Take 10 mLs by mouth every 6 (six) hours as needed for cough.     fluticasone  (FLONASE ) 50 MCG/ACT nasal spray Place into both nostrils daily.     hydrALAZINE  (APRESOLINE ) 10 MG tablet Take 10 mg by mouth daily as needed.     hydrALAZINE  (APRESOLINE ) 50 MG tablet Take 50 mg by mouth 4 (four) times daily.     ipratropium-albuterol  (DUONEB) 0.5-2.5 (3) MG/3ML SOLN Take 3 mLs by nebulization every 6 (six) hours as needed.     loperamide  (IMODIUM  A-D) 2 MG tablet Take 2 mg by mouth 3 (three) times daily as needed for diarrhea or loose stools.     magnesium  oxide (MAG-OX) 400 (240 Mg) MG tablet Take 400 mg by mouth 2 (two) times daily.     Multiple Vitamins-Iron  (MULTI-VITAMIN/IRON  PO) Take 1 tablet by mouth daily.     ondansetron  (ZOFRAN -ODT) 4 MG disintegrating tablet Take 4 mg by mouth every 4 (four) hours as needed for nausea or vomiting.     PHENobarbital  (LUMINAL) 64.8 MG tablet  Take 1 tablet (64.8 mg total) by mouth daily. Take 1 tablet every morning 30 tablet 5   phenytoin  (DILANTIN ) 100 MG ER capsule Take 200 mg by mouth daily.     Polyethyl Glycol-Propyl Glycol (SYSTANE) 0.4-0.3 % SOLN Place 1 drop into both eyes in the morning and at bedtime.     Probiotic Product (ALIGN) 4 MG CAPS Take 4 mg by mouth daily.     torsemide  (DEMADEX ) 20 MG tablet Take 1 tablet (20 mg total) by mouth daily.     valsartan  (DIOVAN ) 160 MG tablet Take 1 tablet (160 mg total) by mouth 2 (two) times daily. 60 tablet 0   No current facility-administered medications for this visit.     Review of Systems    She denies chest pain, palpitations, dyspnea, pnd, orthopnea, n, v, dizziness, syncope, edema, weight gain, or early satiety. All other systems reviewed and are otherwise negative except as noted above.   Physical Exam    VS:  BP 120/64 (BP Location: Left Arm, Patient Position: Sitting)   Pulse 70   Ht 5\' 1"   (1.549 m)   Wt 122 lb 9.6 oz (55.6 kg)   SpO2 99%   BMI 23.17 kg/m   GEN: Well nourished, well developed, in no acute distress. HEENT: normal. Neck: Supple, no JVD, bilateral carotid bruits, no masses. Cardiac: RRR, no murmurs, rubs, or gallops. No clubbing, cyanosis, edema.  Radials/DP/PT 2+ and equal bilaterally.  Respiratory:  Respirations regular and unlabored, clear to auscultation bilaterally. GI: Soft, nontender, nondistended, BS + x 4. MS: no deformity or atrophy. Skin: warm and dry, no rash. Neuro:  Strength and sensation are intact. Psych: Normal affect.  Accessory Clinical Findings    ECG is not done today.  Lab Results  Component Value Date   WBC 4.4 04/02/2023   HGB 9.3 (A) 04/02/2023   HCT 27 (A) 04/02/2023   MCV 106.2 (H) 04/26/2022   PLT 207 04/02/2023   Lab Results  Component Value Date   CREATININE 1.3 (A) 04/02/2023   BUN 45 (A) 04/02/2023   NA 142 04/02/2023   K 4.0 04/02/2023   CL 101 04/02/2023   CO2 28 (A) 04/02/2023   Lab Results  Component Value Date   ALT 27 09/18/2022   AST 31 09/18/2022   ALKPHOS 3.9 (A) 09/18/2022   BILITOT 0.9 04/26/2022   Lab Results  Component Value Date   CHOL 208 (H) 01/28/2022   HDL 80 01/28/2022   LDLCALC 116 (H) 01/28/2022   TRIG 59 01/28/2022   CHOLHDL 2.6 01/28/2022    Lab Results  Component Value Date   HGBA1C 4.9 04/22/2022     Coronary CTA 01/27/22   __Coronary Arteries:  Normal coronary origin.  Right dominance.   RCA is a large dominant artery that gives rise to PDA and PLA. Calcified plaque in ostial RCA causes 0-24% stenosis. Calcified plaque in proximal RCA causes 25-49% stenosis.   Left main is a large artery that gives rise to LAD and LCX arteries. Calcified plaque in ostial left main causes 0-24% stenosis   LAD is a large vessel. Calcified plaque in proximal LAD causes 0-24% stenosis.   LCX is a non-dominant artery that gives rise to one large OM1 branch. There is no plaque.    Other findings:   Left Ventricle: Normal size   Left Atrium: Mild enlargement   Pulmonary Veins: Normal configuration   Right Ventricle: Normal size   Right Atrium: Normal size.  Dual chamber  pacemaker   Thoracic aorta: Normal size   Pulmonary Arteries: Normal size   Systemic Veins: Normal drainage   Pericardium: Moderate pericardial effusion, primarily located adjacent to the posterior/lateral walls of the LV   IMPRESSION: 1. Technically difficult study due to multiple PVCs during acquisition. With ECG editing study is largely interpretable, though distal RCA remains uninterpretable   2. Coronary calcium  score of 329. This was 67th percentile for age and sex matched control.   3. Normal coronary origin with right dominance.   4. Nonobstructive CAD   5. Calcified plaque in proximal RCA causes mild (25-49%) stenosis.   6. Calcified plaque causes minimal (0-24%) stenosis in ostial left main, ostial RCA, and proximal LAD   7. Moderate pericardial effusion, primarily located adjacent to the posterior/lateral walls of the LV   OVER Read by Radiologist      IMPRESSION: Cardiomegaly.  Small pericardial effusion.   Left lower lobe atelectasis or scarring.   Aortic atherosclerosis.     Echo 01/28/22 IMPRESSIONS     1. Left ventricular ejection fraction, by estimation, is 50 to 55%. The  left ventricle has low normal function. The left ventricle has no regional  wall motion abnormalities. Indeterminate diastolic filling due to E-A  fusion.   2. Right ventricular systolic function is normal. The right ventricular  size is normal. There is mildly elevated pulmonary artery systolic  pressure.   3. Left atrial size was mildly dilated.   4. Right atrial size was mildly dilated.   5. Moderate pericardial effusion. The pericardial effusion is  circumferential. There is no evidence of cardiac tamponade.   6. The mitral valve is normal in structure. Mild mitral valve   regurgitation.   7. The aortic valve is grossly normal. Aortic valve regurgitation is not  visualized. No aortic stenosis is present.   8. The inferior vena cava is normal in size with greater than 50%  respiratory variability, suggesting right atrial pressure of 3 mmHg.   Comparison(s): Prior images reviewed side by side. The pericardial  effusion is larger.   FINDINGS   Left Ventricle: Left ventricular ejection fraction, by estimation, is 50  to 55%. The left ventricle has low normal function. The left ventricle has  no regional wall motion abnormalities. The left ventricular internal  cavity size was normal in size.  There is no left ventricular hypertrophy. Abnormal (paradoxical) septal  motion, consistent with RV pacemaker. Indeterminate diastolic filling due  to E-A fusion.   Right Ventricle: The right ventricular size is normal. No increase in  right ventricular wall thickness. Right ventricular systolic function is  normal. There is mildly elevated pulmonary artery systolic pressure. The  tricuspid regurgitant velocity is 3.04   m/s, and with an assumed right atrial pressure of 3 mmHg, the estimated  right ventricular systolic pressure is 40.0 mmHg.   Left Atrium: Left atrial size was mildly dilated.   Right Atrium: Right atrial size was mildly dilated.   Pericardium: A moderately sized pericardial effusion is present. The  pericardial effusion is circumferential. There is no evidence of cardiac  tamponade.   Mitral Valve: The mitral valve is normal in structure. Mild mitral valve  regurgitation. MV peak gradient, 15.2 mmHg. The mean mitral valve gradient  is 4.0 mmHg.   Tricuspid Valve: The tricuspid valve is normal in structure. Tricuspid  valve regurgitation is not demonstrated.   Aortic Valve: The aortic valve is grossly normal. Aortic valve  regurgitation is not visualized. No aortic stenosis is  present.   Pulmonic Valve: The pulmonic valve was normal in  structure. Pulmonic valve  regurgitation is trivial.   Aorta: The aortic root is normal in size and structure.   Venous: The inferior vena cava is normal in size with greater than 50%  respiratory variability, suggesting right atrial pressure of 3 mmHg.   IAS/Shunts: No atrial level shunt detected by color flow Doppler.   ___________   Echo 02/13/22: IMPRESSIONS     1. Moderate pericardial effusion. Unchanged from prior study. Moderate  pericardial effusion. The pericardial effusion is circumferential. There  is no evidence of cardiac tamponade.   2. Left ventricular ejection fraction, by estimation, is 55 to 60%. The  left ventricle has normal function. The left ventricle has no regional  wall motion abnormalities.   3. Right ventricular systolic function is normal. The right ventricular  size is normal. Tricuspid regurgitation signal is inadequate for assessing  PA pressure.   4. The mitral valve is myxomatous. Mild mitral valve regurgitation.   5. The inferior vena cava is normal in size with greater than 50%  respiratory variability, suggesting right atrial pressure of 3 mmHg.   Comparison(s): No significant change from prior study. 01/28/22 EF 50-55%.  Moderate pericardial effusion.   Echocardiogram 08/23/2022   IMPRESSIONS     1. Left ventricular ejection fraction, by estimation, is 60 to 65%. The  left ventricle has normal function. The left ventricle has no regional  wall motion abnormalities. There is mild concentric left ventricular  hypertrophy. Left ventricular diastolic  function could not be evaluated. Elevated left ventricular end-diastolic  pressure.   2. Right ventricular systolic function is normal. The right ventricular  size is normal. There is severely elevated pulmonary artery systolic  pressure.   3. Left atrial size was severely dilated.   4. Right atrial size was severely dilated.   5. Mostly posterior pericardial effusion measuring 1.77 cm. Compared  with  the echo 02/2022, the effusion is larger. No evidence of tamponade.  Moderate pericardial effusion. There is no evidence of cardiac tamponade.  Moderate pleural effusion in the left  lateral region.   6. The mitral valve is normal in structure. Trivial mitral valve  regurgitation. No evidence of mitral stenosis.   7. The aortic valve is tricuspid. Aortic valve regurgitation is not  visualized. No aortic stenosis is present.   8. The inferior vena cava is normal in size with <50% respiratory  variability, suggesting right atrial pressure of 8 mmHg.    Assessment & Plan    1. Recurrent syncope/CHB: S/p PPM 11/07/2021.  She denies any recurrent syncope.  Follow up with EP  2.  Paroxysmal  atrial fibrillation: patient's last device check demonstrated low Afib burden. Rate controlled with Afib. She is completely asymptomatic. Did not tolerate amiodarone  before. Continue with rate control strategy. Unfortunately, she is not a candidate for DOAC at this time given history of cerebral AV malformation.  Also not a candidate for Watchman device.   3. Chronic  moderate pericardial effusion noted on CT and Echo. No evidence of hemodynamic compromise. No symptoms   5. Carotid artery disease: Carotid Dopplers in July 2022 showed 40 to 59% B ICA stenosis, stable from prior study.  Bilateral carotid bruits on exam.  Asymptomatic.  No indication for repeat study at this time.  6. Hypertension: BP is now well controlled.   7. Hyperlipidemia: LDL was 116 Continue simvastatin .  8. Anemia: CBC was 9.9 in June 2023.  Monitored and  managed per PCP.  9. Partial symptomatic epilepsy with complex partial seizures: Managed on Dilantin , and phenobarbital .  Stable.  10.  Disposition: follow up in 6 months   Jamilla Galli [place], MD,FACC 06/26/2023, 10:48 AM

## 2023-06-25 ENCOUNTER — Ambulatory Visit: Payer: Medicare Other | Admitting: General Practice

## 2023-06-26 ENCOUNTER — Ambulatory Visit: Payer: Medicare Other | Attending: General Practice | Admitting: Cardiology

## 2023-06-26 ENCOUNTER — Encounter: Payer: Self-pay | Admitting: Cardiology

## 2023-06-26 VITALS — BP 120/64 | HR 70 | Ht 61.0 in | Wt 122.6 lb

## 2023-06-26 DIAGNOSIS — I480 Paroxysmal atrial fibrillation: Secondary | ICD-10-CM

## 2023-06-26 DIAGNOSIS — I442 Atrioventricular block, complete: Secondary | ICD-10-CM | POA: Diagnosis not present

## 2023-06-26 DIAGNOSIS — I10 Essential (primary) hypertension: Secondary | ICD-10-CM

## 2023-06-26 DIAGNOSIS — I3139 Other pericardial effusion (noninflammatory): Secondary | ICD-10-CM | POA: Diagnosis not present

## 2023-06-26 NOTE — Patient Instructions (Signed)
 Medication Instructions:  Continue same medications *If you need a refill on your cardiac medications before your next appointment, please call your pharmacy*   Lab Work: None ordered   Testing/Procedures: None ordered   Follow-Up: At [facility], you and your health needs are our priority.  As part of our continuing mission to provide you with exceptional heart care, we have created designated Provider Care Teams.  These Care Teams include your primary Cardiologist (physician) and Advanced Practice Providers (APPs -  Physician Assistants and Nurse Practitioners) who all work together to provide you with the care you need, when you need it.  We recommend signing up for the patient portal called "MyChart".  Sign up information is provided on this After Visit Summary.  MyChart is used to connect with patients for Virtual Visits (Telemedicine).  Patients are able to view lab/test results, encounter notes, upcoming appointments, etc.  Non-urgent messages can be sent to your provider as well.   To learn more about what you can do with MyChart, go to [email or web address].    Your next appointment:  6 months    Call in April to schedule July appointment     Provider:  Dr.[name]

## 2023-07-04 DIAGNOSIS — N189 Chronic kidney disease, unspecified: Secondary | ICD-10-CM | POA: Diagnosis not present

## 2023-07-04 DIAGNOSIS — I5020 Unspecified systolic (congestive) heart failure: Secondary | ICD-10-CM | POA: Diagnosis not present

## 2023-07-08 ENCOUNTER — Encounter: Payer: Self-pay | Admitting: Adult Health

## 2023-07-08 ENCOUNTER — Non-Acute Institutional Stay: Payer: Medicare Other | Admitting: Adult Health

## 2023-07-08 VITALS — BP 150/60 | HR 80 | Temp 97.3°F | Resp 18 | Ht 61.0 in | Wt 123.2 lb

## 2023-07-08 DIAGNOSIS — D539 Nutritional anemia, unspecified: Secondary | ICD-10-CM | POA: Diagnosis not present

## 2023-07-08 DIAGNOSIS — G609 Hereditary and idiopathic neuropathy, unspecified: Secondary | ICD-10-CM | POA: Diagnosis not present

## 2023-07-08 DIAGNOSIS — I5032 Chronic diastolic (congestive) heart failure: Secondary | ICD-10-CM | POA: Diagnosis not present

## 2023-07-08 DIAGNOSIS — R21 Rash and other nonspecific skin eruption: Secondary | ICD-10-CM

## 2023-07-08 DIAGNOSIS — I10 Essential (primary) hypertension: Secondary | ICD-10-CM | POA: Diagnosis not present

## 2023-07-08 MED ORDER — TORSEMIDE 20 MG PO TABS: 20.0000 mg | ORAL_TABLET | ORAL | Status: DC

## 2023-07-08 MED ORDER — IRON (FERROUS SULFATE) 325 (65 FE) MG PO TABS: 325.0000 mg | ORAL_TABLET | ORAL | Status: AC

## 2023-07-08 MED ORDER — TRIAMCINOLONE ACETONIDE 0.1 % EX CREA: 1.0000 | TOPICAL_CREAM | Freq: Two times a day (BID) | CUTANEOUS | Status: AC | PRN

## 2023-07-08 NOTE — Progress Notes (Signed)
Location: [facility]   POS:  clinic  Provider:  Peggye Ley, ANP [facility] [phone]   Code Status: DNR Goals of Care:     06/10/2023   10:46 AM  Advanced Directives  Does Patient Have a Medical Advance Directive? Yes  Type of Advance Directive Living will;Out of facility DNR (pink MOST or yellow form)  Does patient want to make changes to medical advance directive? No - Patient declined  Pre-existing out of facility DNR order (yellow form or pink MOST form) Yellow form placed in chart (order not valid for inpatient use)     Chief Complaint  Patient presents with   Medical Management of Chronic Issues    2 week follow up.  Neuropathy in feet, pt had ome itching Medicare Annual Wellness, covid and DEXA SCAN.    HPI: Patient is a 88 y.o. female seen today for medical management of chronic diseases.    Discussed the use of AI scribe software for clinical note transcription with the patient, who gave verbal consent to proceed.  History of Present Illness   The patient, an 87 year old with a history of heart problems and anemia, presented for a two-week follow-up. They reported experiencing fatigue, which they initially attributed to age and boredom. They denied experiencing shortness of breath, palpitations, or chest pain. The patient's blood pressure was recorded as 150/60, and their weight was stable at 123 lbs. They reported a significant weight loss from their previous weight of around 135 lbs, which they attributed to fluid loss managed with diuretics.  The patient's medication regimen included Norvasc, Hydralazine, Torsemide, and Valsartan for blood pressure and heart-related issues. They also have a pacemaker due to a complete heart block. The patient's ejection fraction was reported to be 45-50%, indicating a slight reduction from the normal range.  The patient also reported worsening neuropathy in their feet, affecting their balance and causing  numbness. They also reported occasional rashes that appeared in different places and resolved spontaneously. They experienced a significant episode of diarrhea, which left them feeling clammy and cold, but this has not recurred. The patient's stool was tested and found to be negative for blood.  The patient's hemoglobin levels were reported to be low, indicating anemia. Their B12 levels were within normal limits, but due to the worsening neuropathy, further investigation into a potential B12 deficiency was planned.      Past Medical History:  Diagnosis Date   Abnormal cardiovascular stress test    Carotid arterial disease (HCC)    MODERATE RIGHT   Cerebral AV malformation    DDD (degenerative disc disease)    SEVERE WITH SPINAL STENOSIS   Hypercholesterolemia    Hypertension    MVP (mitral valve prolapse)    MILD   Numbness    LEFT FOOT AND ANKLE   Osteopenia    Presence of permanent cardiac pacemaker    Pseudo-gout     Past Surgical History:  Procedure Laterality Date   APPENDECTOMY     BREAST MASS EXCISION     CARPAL TUNNEL RELEASE     CHOLECYSTECTOMY N/A 04/23/2022   Procedure: LAPAROSCOPIC CHOLECYSTECTOMY;  Surgeon: Abigail Miyamoto, MD;  Location: [facility];  Service: General;  Laterality: N/A;   gallbladder     PACEMAKER IMPLANT N/A 11/07/2021   Procedure: PACEMAKER IMPLANT;  Surgeon: Marinus Maw, MD;  Location: [facility] INVASIVE [facility];  Service: Cardiovascular;  Laterality: N/A;   REMOVAL OF PARATHYROID GLAND     STRESS NUCLEAR STUDY  ABNORMAL   TONSILLECTOMY      Allergies  Allergen Reactions   Aleve [Naproxen] Other (See Comments)   Azithromycin Other (See Comments)    Reaction not recalled   Penicillins Hives and Other (See Comments)    Bad reaction as a child after a shot of Penicillin   Pravastatin Other (See Comments)    Reaction not recalled   Sulfa Antibiotics Other (See Comments)   Sulfa Drugs Cross Reactors Other (See Comments)    Reaction not  recalled- stated she can take it "in small doses"   Tape Itching and Other (See Comments)    Cannot wear for an extended period of time   Naproxen Sodium Rash    Outpatient Encounter Medications as of 07/08/2023  Medication Sig   acetaminophen (TYLENOL) 500 MG tablet Take 500 mg by mouth every 6 (six) hours as needed for mild pain or headache.   amLODipine (NORVASC) 5 MG tablet Take 1 tablet (5 mg total) by mouth daily.   b complex vitamins capsule Take 1 capsule by mouth daily.   dextromethorphan-guaiFENesin (ROBITUSSIN-DM) 10-100 MG/5ML liquid Take 10 mLs by mouth every 6 (six) hours as needed for cough.   fluticasone (FLONASE) 50 MCG/ACT nasal spray Place into both nostrils daily.   hydrALAZINE (APRESOLINE) 10 MG tablet Take 10 mg by mouth daily as needed.   hydrALAZINE (APRESOLINE) 50 MG tablet Take 50 mg by mouth 4 (four) times daily.   ipratropium-albuterol (DUONEB) 0.5-2.5 (3) MG/3ML SOLN Take 3 mLs by nebulization every 6 (six) hours as needed.   Iron, Ferrous Sulfate, 325 (65 Fe) MG TABS Take 325 mg by mouth 3 (three) times a week.   loperamide (IMODIUM A-D) 2 MG tablet Take 2 mg by mouth 3 (three) times daily as needed for diarrhea or loose stools.   magnesium oxide (MAG-OX) 400 (240 Mg) MG tablet Take 400 mg by mouth 2 (two) times daily.   ondansetron (ZOFRAN-ODT) 4 MG disintegrating tablet Take 4 mg by mouth every 4 (four) hours as needed for nausea or vomiting.   PHENobarbital (LUMINAL) 64.8 MG tablet Take 1 tablet (64.8 mg total) by mouth daily. Take 1 tablet every morning   phenytoin (DILANTIN) 100 MG ER capsule Take 200 mg by mouth daily.   Polyethyl Glycol-Propyl Glycol (SYSTANE) 0.4-0.3 % SOLN Place 1 drop into both eyes in the morning and at bedtime.   Probiotic Product (ALIGN) 4 MG CAPS Take 4 mg by mouth daily.   torsemide (DEMADEX) 20 MG tablet Take 1 tablet (20 mg total) by mouth daily. (Patient taking differently: Take 20 mg by mouth 3 (three) times a week.)    triamcinolone cream (KENALOG) 0.1 % Apply 1 Application topically 2 (two) times daily as needed.   valsartan (DIOVAN) 160 MG tablet Take 1 tablet (160 mg total) by mouth 2 (two) times daily.   [DISCONTINUED] Multiple Vitamins-Iron (MULTI-VITAMIN/IRON PO) Take 1 tablet by mouth daily.   No facility-administered encounter medications on file as of 07/08/2023.    Review of Systems:  Review of Systems  Constitutional:  Positive for fatigue. Negative for activity change, appetite change, chills, diaphoresis, fever and unexpected weight change.  HENT:  Negative for congestion.   Respiratory:  Negative for cough, shortness of breath and wheezing.   Cardiovascular:  Positive for leg swelling. Negative for chest pain and palpitations.  Gastrointestinal:  Negative for abdominal distention, abdominal pain, constipation and diarrhea.       1 episode of diarrhea  Genitourinary:  Negative for  difficulty urinating and dysuria.  Musculoskeletal:  Positive for gait problem. Negative for arthralgias, back pain, joint swelling and myalgias.  Neurological:  Negative for dizziness, tremors, seizures, syncope, facial asymmetry, speech difficulty, weakness, light-headedness, numbness and headaches.  Psychiatric/Behavioral:  Negative for agitation, behavioral problems and confusion.     Health Maintenance  Topic Date Due   DEXA SCAN  Never done   Medicare Annual Wellness (AWV)  03/20/2023   COVID-19 Vaccine (7 - 2024-25 season) 07/24/2023 (Originally 05/28/2023)   Pneumonia Vaccine 44+ Years old  Completed   INFLUENZA VACCINE  Completed   Zoster Vaccines- Shingrix  Completed   HPV VACCINES  Aged Out   DTaP/Tdap/Td  Discontinued    Physical Exam: Vitals:   07/08/23 1407  BP: (!) 150/60  Pulse: 80  Resp: 18  Temp: (!) 97.3 F (36.3 C)  TempSrc: Temporal  SpO2: 96%  Weight: 123 lb 3.2 oz (55.9 kg)  Height: 5\' 1"  (1.549 m)   Body mass index is 23.28 kg/m. Physical Exam Vitals and nursing note  reviewed.  Constitutional:      General: She is not in acute distress.    Appearance: She is not diaphoretic.  HENT:     Head: Normocephalic and atraumatic.  Neck:     Vascular: No JVD.  Cardiovascular:     Rate and Rhythm: Normal rate and regular rhythm.     Heart sounds: No murmur heard. Pulmonary:     Effort: Pulmonary effort is normal. No respiratory distress.     Breath sounds: Normal breath sounds. No wheezing.  Abdominal:     General: Bowel sounds are normal. There is no distension.     Palpations: Abdomen is soft.  Musculoskeletal:     Comments: + 1 BLE edema  Skin:    General: Skin is warm and dry.  Neurological:     Mental Status: She is alert and oriented to person, place, and time.  Psychiatric:        Mood and Affect: Mood normal.     Labs reviewed: Basic Metabolic Panel: Recent Labs    08/28/22 1131 09/18/22 0000 12/20/22 1015 12/27/22 1154 01/07/23 0000 04/02/23 0000 04/04/23 0000 06/13/23 0000 06/20/23 0000  NA 141   < > 138 139   < > 142 142 131* 141  K 5.6*   < > 5.4* 4.9   < > 4.0 4.0 4.3 4.3  CL 104   < > 103 101   < > 101 101 101 104  CO2 20   < > 21 22   < > 28* 28* 24* 24*  GLUCOSE 95  --  87 98  --   --   --   --   --   BUN 38*   < > 34* 30*   < > 45* 45* 62* 57*  CREATININE 1.20*   < > 1.22* 1.19*   < > 1.3* 1.3* 1.4* 1.5*  CALCIUM 9.7   < > 9.9 10.1   < > 9.1  --  9.1 9.4   < > = values in this interval not displayed.   Liver Function Tests: Recent Labs    07/31/22 0000 09/18/22 0000 06/13/23 0000  AST 32 31 30  ALT 23 27 21   ALKPHOS 63 3.9* 74  ALBUMIN 3.7 3.9 3.5   No results for input(s): "LIPASE", "AMYLASE" in the last 8760 hours. No results for input(s): "AMMONIA" in the last 8760 hours. CBC: Recent Labs    04/02/23 0000  04/04/23 0000 06/20/23 0000  WBC 4.4 4.4 4.6  HGB 9.3* 9.3* 8.4*  HCT 27* 27* 24*  PLT 207 207 191   Lipid Panel: Recent Labs    06/13/23 0000  HDL 97*  LDLCALC 97  TRIG 51   Lab Results   Component Value Date   HGBA1C 204 06/13/2023    Procedures since last visit: No results found.  Assessment/Plan  Assessment and Plan    Anemia Hemoglobin improved from 8.4 to 9.6. Fatigue present, possibly multifactorial due to anemia, age, and heart disease. -Start iron supplementation three times a week. -Repeat blood work in one month to assess response to iron supplementation. -Check for potential B12 deficiency and thyroid levels in next blood work.  Peripheral Neuropathy Increased numbness in feet and balance issues reported. Atrophy noted in calves. -Consider B12 deficiency as a potential cause, to be investigated in next blood work.  Congestive Heart Failure Stable weight and no reported shortness of breath. On Norvasc, Hydralazine, Torsemide, and Valsartan. -Continue current medications.  Hypertension Blood pressure readings vary, but manual readings are within normal range. -Continue current medications.  Rash Intermittent rash reported on arms. -Prescribe Triamcinolone cream for symptomatic relief. -Advise patient to take a picture of the rash during the next occurrence for further evaluation.  Follow-up in 1 month with Dr. Chales Abrahams.       Labs/tests ordered:  * No order type specified * CBC TSH Iron panel MMA in one month  Next appt:  08/05/2023      Total time :  time greater than 50% of total time spent doing pt counseling and coordination of care

## 2023-07-23 ENCOUNTER — Encounter: Payer: Self-pay | Admitting: Neurology

## 2023-07-23 ENCOUNTER — Ambulatory Visit (INDEPENDENT_AMBULATORY_CARE_PROVIDER_SITE_OTHER): Payer: Medicare Other | Admitting: Neurology

## 2023-07-23 VITALS — BP 156/55 | HR 76 | Ht 61.0 in | Wt 120.8 lb

## 2023-07-23 DIAGNOSIS — G40209 Localization-related (focal) (partial) symptomatic epilepsy and epileptic syndromes with complex partial seizures, not intractable, without status epilepticus: Secondary | ICD-10-CM

## 2023-07-23 DIAGNOSIS — G629 Polyneuropathy, unspecified: Secondary | ICD-10-CM

## 2023-07-23 DIAGNOSIS — Q282 Arteriovenous malformation of cerebral vessels: Secondary | ICD-10-CM

## 2023-07-23 NOTE — Progress Notes (Signed)
NEUROLOGY FOLLOW UP OFFICE NOTE  ADAMARYS SHALL [id number] 06/06/1936  HISTORY OF PRESENT ILLNESS: I had the pleasure of seeing [name] in follow-up in the neurology clinic on 07/23/2023.  The patient was last seen a year ago for seizures. She is alone in the office today.  Records and images were personally reviewed where available.  She had been seizure-free since teenage years on Phenobarbital 64.8mg  daily and Phenytoin 200mg  daily. 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 Overnight EEG done which initially showed continuous generalized 3 to 6 Hz theta-delta slowing, generalized periodic discharges with triphasic morphology were noted at 1hz , more prominent when awake/stimulated. Periodic discharges resolved, EEG from 11/17 to 11/18 showed diffuse slowing. Episode felt to be focal seizure in the setting of stress, anesthesia, analgesics.   She denies any seizures in the past year, no word-finding difficulties or confusion. She denies any staring/unresponsive episodes, gaps in time, olfactory/gustatory hallucinations, focal numbness/tingling/weakness, myoclonic jerks. No headaches, dizziness, vision changes, no falls. She is beginning for feel like she has neuropathy, she describes tingling in both feet like they are going to sleep, worse on the left.. No pain. She reports symptoms are constant and annoying. She sleeps well. At times during the visit, right eyelid is closed, she does not notice it and states her eyelids are "sticky" sometimes. No double vision or difficulty swallowing. She is noted to have a deformity on the right upper arm, she denies any prior injuries and attributes the soreness on right arm to the BP cuff at [facility].    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 She reports that seizures started in childhood, she had a nocturnal seizure and was unresponsive, brought to [facility] where she was found to have the AVM. She was started on Phenobarbital and Phenytoin and states that she had no seizures since her teenage years, until she had a witnessed seizure post-op on 04/23/2022. She underwent laparoscopic cholecystectomy and per notes, had sudden onset difficulty with speech and confusion. She had word-finding problems, speaking short sentences that made sense but were not in the context of the conversation. She had difficulty following instructions. 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 She had an overnight EEG done which initially showed continuous generalized 3 to 6 Hz theta-delta slowing, generalized periodic discharges with triphasic morphology were noted at 1hz , more prominent when awake/stimulated. Periodic discharges resolved, EEG from 11/17 to 11/18 showed diffuse slowing. Dilantin level was low at 2.6, Phenobarbital level therapeutic 18.9. Per notes, likely had a postop seizure in the setting of stress, anesthesia, and analgesics. No changes were made to her medications, she continues on Phenobarbital 64.8mg  daily and Phenytoin 200mg  daily without side effects.   She denies any staring/unresponsive episodes, gaps in time, olfactory/gustatory hallucinations, focal numbness/tingling/weakness, myoclonic jerks. She denies any headaches, dizziness, diplopia, dysarthria/dysphagia, neck/back pain, bowel/bladder dysfunction. She always uses her walker. She had a  near-fall 6 weeks ago. Sleep is good.   Epilepsy Risk Factors:  Large left frontal AVM. She had a normal birth and early development.  There is no history of febrile convulsions, CNS infections such as meningitis/encephalitis, significant traumatic brain injury, neurosurgical procedures, or family history of seizures.  PAST MEDICAL HISTORY: Past Medical History:  Diagnosis Date   Abnormal cardiovascular stress test    Carotid arterial disease (HCC)    MODERATE RIGHT   Cerebral AV malformation    DDD (degenerative disc disease)    SEVERE WITH SPINAL STENOSIS   Hypercholesterolemia    Hypertension    MVP (mitral valve prolapse)    MILD   Numbness    LEFT FOOT AND ANKLE   Osteopenia    Presence of permanent cardiac pacemaker    Pseudo-gout     MEDICATIONS: Current Outpatient Medications on File Prior to Visit  Medication Sig Dispense Refill   acetaminophen (TYLENOL) 500 MG tablet Take 500 mg by mouth every 6 (six) hours as needed for mild pain or headache.     amLODipine (NORVASC) 5 MG tablet Take 1 tablet (5 mg total) by mouth daily.     b complex vitamins capsule Take 1 capsule by mouth daily.     dextromethorphan-guaiFENesin (ROBITUSSIN-DM) 10-100 MG/5ML liquid Take 10 mLs by mouth every 6 (six) hours as needed for cough.     fluticasone (FLONASE) 50 MCG/ACT nasal spray Place into both nostrils daily.     hydrALAZINE (APRESOLINE) 10 MG tablet Take 10 mg by mouth daily as needed.     hydrALAZINE (APRESOLINE) 50 MG tablet Take 50 mg by mouth 4 (four) times daily.     ipratropium-albuterol (DUONEB) 0.5-2.5 (3) MG/3ML SOLN Take 3 mLs by nebulization every 6 (six) hours as needed.     Iron, Ferrous Sulfate, 325 (65 Fe) MG TABS Take 325 mg by mouth 3 (three) times a week.     loperamide (IMODIUM A-D) 2 MG tablet Take 2 mg by mouth 3 (three) times daily as needed for diarrhea or loose stools.     magnesium oxide (MAG-OX) 400 (240 Mg) MG tablet Take 400 mg by mouth 2 (two) times daily.      Multiple Vitamins-Iron (MULTI-VITAMIN/IRON PO) Take by mouth.     ondansetron (ZOFRAN-ODT) 4 MG disintegrating tablet Take 4 mg by mouth every 4 (four) hours as needed for nausea or vomiting.     PHENobarbital (LUMINAL) 64.8 MG tablet Take 1 tablet (64.8 mg total) by mouth daily. Take 1 tablet every morning 30 tablet 5   phenytoin (DILANTIN) 100 MG ER capsule Take 200 mg by mouth daily.     Polyethyl Glycol-Propyl Glycol (SYSTANE) 0.4-0.3 % SOLN Place 1 drop into both eyes in the morning and at bedtime.     Probiotic Product (ALIGN) 4 MG CAPS Take 4 mg by mouth daily.     torsemide (DEMADEX) 20 MG tablet Take 1 tablet (20 mg total) by mouth 3 (three) times a week.     triamcinolone cream (KENALOG) 0.1 % Apply 1 Application topically 2 (two) times daily as needed.     valsartan (DIOVAN) 160 MG tablet Take 1 tablet (160 mg total) by mouth 2 (two) times daily. 60 tablet 0   No current facility-administered medications on file prior to visit.    ALLERGIES: Allergies  Allergen Reactions   Aleve [Naproxen] Other (See Comments)   Azithromycin Other (See Comments)    Reaction not recalled   Penicillins Hives and Other (See Comments)    Bad reaction as a child after a shot of  Penicillin   Pravastatin Other (See Comments)    Reaction not recalled   Sulfa Antibiotics Other (See Comments)   Sulfa Drugs Cross Reactors Other (See Comments)    Reaction not recalled- stated she can take it "in small doses"   Tape Itching and Other (See Comments)    Cannot wear for an extended period of time   Naproxen Sodium Rash    FAMILY HISTORY: Family History  Problem Relation Age of Onset   Stroke Mother    Lung cancer Father    Pneumonia Brother    Breast cancer Neg Hx     SOCIAL HISTORY: Social History   Socioeconomic History   Marital status: Widowed    Spouse name: Not on file   Number of children: 3   Years of education: Not on file   Highest education level: Not on file  Occupational  History   Not on file  Tobacco Use   Smoking status: Former    Current packs/day: 0.00    Average packs/day: 1 pack/day for 14.0 years (14.0 ttl pk-yrs)    Types: Cigarettes    Start date: 11/08/1954    Quit date: 11/07/1968    Years since quitting: 54.7   Smokeless tobacco: Never  Vaping Use   Vaping status: Never Used  Substance and Sexual Activity   Alcohol use: No   Drug use: No   Sexual activity: Not on file  Other Topics Concern   Not on file  Social History Narrative   [place] [employer]    Right handed    Social Drivers of Health   Financial Resource Strain: Not on file  Food Insecurity: No Food Insecurity (04/22/2022)   Hunger Vital Sign    Worried About Running Out of Food in the Last Year: Never true    Ran Out of Food in the Last Year: Never true  Transportation Needs: No Transportation Needs (04/22/2022)   PRAPARE - Administrator, Civil Service (Medical): No    Lack of Transportation (Non-Medical): No  Physical Activity: Not on file  Stress: Not on file  Social Connections: Not on file  Intimate Partner Violence: Not At Risk (04/22/2022)   Humiliation, Afraid, Rape, and Kick questionnaire    Fear of Current or Ex-Partner: No    Emotionally Abused: No    Physically Abused: No    Sexually Abused: No     PHYSICAL EXAM: Vitals:   07/23/23 0953  BP: (!) 156/55  Pulse: 76  SpO2: 98%   General: No acute distress Head:  Normocephalic/atraumatic Skin/Extremities: No rash, no edema. Deformity on right upper arm Neurological Exam: alert and awake. No aphasia or dysarthria. Fund of knowledge is appropriate.  Attention and concentration are normal.   Cranial nerves: Pupils equal, round. Extraocular movements intact with no nystagmus. At times, right eyelid is closed, but she is able to open them volitionally. Visual fields full.  No facial asymmetry.  Motor: Bulk and tone normal, 4/5 right shoulder abduction, otherwise 5/5 throughout.  Intact to all modalities on both UE, intact cold on both LE, decreased vibration sense to knees bilaterally. Finger to nose testing intact but holds right arm with left hand. Gait narrow-based and steady with walker, no ataxia. No tremors.    IMPRESSION: This is a very pleasant 88 yo RH woman with a history of hypertension, hyperlipidemia, right carotid artery disease, CHB s/p PPM, atrial fibrillation (not on anticoagulation due to large AVM), spinal stenosis, large left  frontal AVM with subsequent seizures. She had been seizure-free since teenage years on Phenobarbital 64.8mg  daily and Phenytoin 200mg  daily. She had a transient episode of aphasia post-op last 04/2022, MRI brain showed the known large left AVM, no acute changes. EEG initially showed generalized 3 to 6 Hz theta-delta slowing, generalized periodic discharges with triphasic morphology were noted at 1hz , that then resolved. Episode felt to be focal seizure, none since 04/2022. Continue current seizure medications. She does have mild neuropathy on exam today, notes indicate PCP will be checking bloodwork, agree with B12, TSH. Discussed side effects of neuropathic pain medications such as Gabapentin, since she does not have pain, would be cautious in her age group, recommended increasing physical activity/exercise. She does not drive. Follow-up as needed, call for any changes.   Thank you for allowing me to participate in her care.  Please do not hesitate to call for any questions or concerns.    Patrcia Dolly, M.D.   CC: Dr. Chales Abrahams

## 2023-07-23 NOTE — Patient Instructions (Signed)
Good to see you.  Continue all your medications  2. Proceed with bloodwork for neuropathy, increase physical activity. Medications used for burning pain with neuropathy, such as Gabapentin, have their own set of side effects such as drowsiness, dizziness, increased risk of falls.   3. Follow-up as needed, call for any changes

## 2023-08-05 ENCOUNTER — Non-Acute Institutional Stay: Payer: Medicare Other | Admitting: Adult Health

## 2023-08-05 ENCOUNTER — Encounter: Payer: Self-pay | Admitting: Adult Health

## 2023-08-05 VITALS — BP 162/68 | HR 81 | Temp 98.0°F | Ht 61.0 in | Wt 124.4 lb

## 2023-08-05 DIAGNOSIS — Z0000 Encounter for general adult medical examination without abnormal findings: Secondary | ICD-10-CM | POA: Diagnosis not present

## 2023-08-05 NOTE — Patient Instructions (Signed)
 [name] , Thank you for taking time to come for your Medicare Wellness Visit. I appreciate your ongoing commitment to your health goals. Please review the following plan we discussed and let me know if I can assist you in the future.   Screening recommendations/referrals: Colonoscopy aged out Mammogram aged out Bone Density ordered  Recommended yearly ophthalmology/optometry visit for glaucoma screening and checkup Recommended yearly dental visit for hygiene and checkup  Vaccinations: Influenza vaccine- due annually in September/October Pneumococcal vaccine up to date Tdap vaccine ordered  Shingles vaccine up to date    Advanced directives: reviewed  Conditions/risks identified: fall risk   Next appointment: 1 year   Preventive Care 47 Years and Older, Female Preventive care refers to lifestyle choices and visits with your health care provider that can promote health and wellness. What does preventive care include? A yearly physical exam. This is also called an annual well check. Dental exams once or twice a year. Routine eye exams. Ask your health care provider how often you should have your eyes checked. Personal lifestyle choices, including: Daily care of your teeth and gums. Regular physical activity. Eating a healthy diet. Avoiding tobacco and drug use. Limiting alcohol use. Practicing safe sex. Taking low-dose aspirin every day. Taking vitamin and mineral supplements as recommended by your health care provider. What happens during an annual well check? The services and screenings done by your health care provider during your annual well check will depend on your age, overall health, lifestyle risk factors, and family history of disease. Counseling  Your health care provider may ask you questions about your: Alcohol use. Tobacco use. Drug use. Emotional well-being. Home and relationship well-being. Sexual activity. Eating habits. History of falls. Memory and  ability to understand (cognition). Work and work Astronomer. Reproductive health. Screening  You may have the following tests or measurements: Height, weight, and BMI. Blood pressure. Lipid and cholesterol levels. These may be checked every 5 years, or more frequently if you are over 25 years old. Skin check. Lung cancer screening. You may have this screening every year starting at age 52 if you have a 30-pack-year history of smoking and currently smoke or have quit within the past 15 years. Fecal occult blood test (FOBT) of the stool. You may have this test every year starting at age 75. Flexible sigmoidoscopy or colonoscopy. You may have a sigmoidoscopy every 5 years or a colonoscopy every 10 years starting at age 81. Hepatitis C blood test. Hepatitis B blood test. Sexually transmitted disease (STD) testing. Diabetes screening. This is done by checking your blood sugar (glucose) after you have not eaten for a while (fasting). You may have this done every 1-3 years. Bone density scan. This is done to screen for osteoporosis. You may have this done starting at age 39. Mammogram. This may be done every 1-2 years. Talk to your health care provider about how often you should have regular mammograms. Talk with your health care provider about your test results, treatment options, and if necessary, the need for more tests. Vaccines  Your health care provider may recommend certain vaccines, such as: Influenza vaccine. This is recommended every year. Tetanus, diphtheria, and acellular pertussis (Tdap, Td) vaccine. You may need a Td booster every 10 years. Zoster vaccine. You may need this after age 41. Pneumococcal 13-valent conjugate (PCV13) vaccine. One dose is recommended after age 35. Pneumococcal polysaccharide (PPSV23) vaccine. One dose is recommended after age 28. Talk to your health care provider about which screenings  and vaccines you need and how often you need them. This information is  not intended to replace advice given to you by your health care provider. Make sure you discuss any questions you have with your health care provider. Document Released: 06/24/2015 Document Revised: 02/15/2016 Document Reviewed: 03/29/2015 Elsevier Interactive Patient Education  2017 [employer].  Fall Prevention in the Home Falls can cause injuries. They can happen to people of all ages. There are many things you can do to make your home safe and to help prevent falls. What can I do on the outside of my home? Regularly fix the edges of walkways and driveways and fix any cracks. Remove anything that might make you trip as you walk through a door, such as a raised step or threshold. Trim any bushes or trees on the path to your home. Use bright outdoor lighting. Clear any walking paths of anything that might make someone trip, such as rocks or tools. Regularly check to see if handrails are loose or broken. Make sure that both sides of any steps have handrails. Any raised decks and porches should have guardrails on the edges. Have any leaves, snow, or ice cleared regularly. Use sand or salt on walking paths during winter. Clean up any spills in your garage right away. This includes oil or grease spills. What can I do in the bathroom? Use night lights. Install grab bars by the toilet and in the tub and shower. Do not use towel bars as grab bars. Use non-skid mats or decals in the tub or shower. If you need to sit down in the shower, use a plastic, non-slip stool. Keep the floor dry. Clean up any water that spills on the floor as soon as it happens. Remove soap buildup in the tub or shower regularly. Attach bath mats securely with double-sided non-slip rug tape. Do not have throw rugs and other things on the floor that can make you trip. What can I do in the bedroom? Use night lights. Make sure that you have a light by your bed that is easy to reach. Do not use any sheets or blankets that  are too big for your bed. They should not hang down onto the floor. Have a firm chair that has side arms. You can use this for support while you get dressed. Do not have throw rugs and other things on the floor that can make you trip. What can I do in the kitchen? Clean up any spills right away. Avoid walking on wet floors. Keep items that you use a lot in easy-to-reach places. If you need to reach something above you, use a strong step stool that has a grab bar. Keep electrical cords out of the way. Do not use floor polish or wax that makes floors slippery. If you must use wax, use non-skid floor wax. Do not have throw rugs and other things on the floor that can make you trip. What can I do with my stairs? Do not leave any items on the stairs. Make sure that there are handrails on both sides of the stairs and use them. Fix handrails that are broken or loose. Make sure that handrails are as long as the stairways. Check any carpeting to make sure that it is firmly attached to the stairs. Fix any carpet that is loose or worn. Avoid having throw rugs at the top or bottom of the stairs. If you do have throw rugs, attach them to the floor with carpet tape.  Make sure that you have a light switch at the top of the stairs and the bottom of the stairs. If you do not have them, ask someone to add them for you. What else can I do to help prevent falls? Wear shoes that: Do not have high heels. Have rubber bottoms. Are comfortable and fit you well. Are closed at the toe. Do not wear sandals. If you use a stepladder: Make sure that it is fully opened. Do not climb a closed stepladder. Make sure that both sides of the stepladder are locked into place. Ask someone to hold it for you, if possible. Clearly mark and make sure that you can see: Any grab bars or handrails. First and last steps. Where the edge of each step is. Use tools that help you move around (mobility aids) if they are needed. These  include: Canes. Walkers. Scooters. Crutches. Turn on the lights when you go into a dark area. Replace any light bulbs as soon as they burn out. Set up your furniture so you have a clear path. Avoid moving your furniture around. If any of your floors are uneven, fix them. If there are any pets around you, be aware of where they are. Review your medicines with your doctor. Some medicines can make you feel dizzy. This can increase your chance of falling. Ask your doctor what other things that you can do to help prevent falls. This information is not intended to replace advice given to you by your health care provider. Make sure you discuss any questions you have with your health care provider. Document Released: 03/24/2009 Document Revised: 11/03/2015 Document Reviewed: 07/02/2014 Elsevier Interactive Patient Education  2017 [employer].

## 2023-08-05 NOTE — Progress Notes (Addendum)
 Subjective:   [name] is a 88 y.o. female who presents for Medicare Annual (Subsequent) preventive examination.  Visit Complete: In person  Patient Medicare AWV questionnaire was completed by the patient on 08/05/23; I have confirmed that all information answered by patient is correct and no changes since this date.        Objective:    Today's Vitals   08/05/23 1532  Pulse: 81  Temp: 98 F (36.7 C)  TempSrc: Temporal  SpO2: 98%  Weight: 124 lb 6.4 oz (56.4 kg)  Height: 5\' 1"  (1.549 m)   Body mass index is 23.51 kg/m.     08/05/2023    3:33 PM 07/23/2023    9:49 AM 06/10/2023   10:46 AM 04/08/2023    1:34 PM 03/25/2023    8:48 AM 03/19/2023   11:25 AM 02/06/2023    2:47 PM  Advanced Directives  Does Patient Have a Medical Advance Directive? Yes Yes Yes Yes Yes Yes Yes  Type of Advance Directive Living will;Out of facility DNR (pink MOST or yellow form) Out of facility DNR (pink MOST or yellow form);Healthcare Power of Attorney Living will;Out of facility DNR (pink MOST or yellow form) Living will;Out of facility DNR (pink MOST or yellow form) Living will;Out of facility DNR (pink MOST or yellow form) Living will;Out of facility DNR (pink MOST or yellow form) Living will;Out of facility DNR (pink MOST or yellow form)  Does patient want to make changes to medical advance directive? No - Patient declined  No - Patient declined Yes (MAU/Ambulatory/Procedural Areas - Information given) No - Patient declined No - Patient declined No - Patient declined  Pre-existing out of facility DNR order (yellow form or pink MOST form) Yellow form placed in chart (order not valid for inpatient use)  Yellow form placed in chart (order not valid for inpatient use)        Current Medications (verified) Outpatient Encounter Medications as of 08/05/2023  Medication Sig   acetaminophen (TYLENOL) 500 MG tablet Take 500 mg by mouth every 6 (six) hours as needed for mild pain or headache.    amLODipine (NORVASC) 5 MG tablet Take 1 tablet (5 mg total) by mouth daily.   b complex vitamins capsule Take 1 capsule by mouth daily.   dextromethorphan-guaiFENesin (ROBITUSSIN-DM) 10-100 MG/5ML liquid Take 10 mLs by mouth every 6 (six) hours as needed for cough.   fluticasone (FLONASE) 50 MCG/ACT nasal spray Place into both nostrils daily.   hydrALAZINE (APRESOLINE) 10 MG tablet Take 10 mg by mouth daily as needed.   hydrALAZINE (APRESOLINE) 50 MG tablet Take 50 mg by mouth 4 (four) times daily.   ipratropium-albuterol (DUONEB) 0.5-2.5 (3) MG/3ML SOLN Take 3 mLs by nebulization every 6 (six) hours as needed.   Iron, Ferrous Sulfate, 325 (65 Fe) MG TABS Take 325 mg by mouth 3 (three) times a week.   loperamide (IMODIUM A-D) 2 MG tablet Take 2 mg by mouth 3 (three) times daily as needed for diarrhea or loose stools.   magnesium oxide (MAG-OX) 400 (240 Mg) MG tablet Take 400 mg by mouth 2 (two) times daily.   Multiple Vitamins-Iron (MULTI-VITAMIN/IRON PO) Take by mouth.   ondansetron (ZOFRAN-ODT) 4 MG disintegrating tablet Take 4 mg by mouth every 4 (four) hours as needed for nausea or vomiting.   PHENobarbital (LUMINAL) 64.8 MG tablet Take 1 tablet (64.8 mg total) by mouth daily. Take 1 tablet every morning   phenytoin (DILANTIN) 100 MG ER capsule Take  200 mg by mouth daily.   Polyethyl Glycol-Propyl Glycol (SYSTANE) 0.4-0.3 % SOLN Place 1 drop into both eyes in the morning and at bedtime.   torsemide (DEMADEX) 20 MG tablet Take 1 tablet (20 mg total) by mouth 3 (three) times a week.   triamcinolone cream (KENALOG) 0.1 % Apply 1 Application topically 2 (two) times daily as needed.   valsartan (DIOVAN) 160 MG tablet Take 1 tablet (160 mg total) by mouth 2 (two) times daily.   [DISCONTINUED] Probiotic Product (ALIGN) 4 MG CAPS Take 4 mg by mouth daily.   No facility-administered encounter medications on file as of 08/05/2023.    Allergies (verified) Aleve [naproxen], Azithromycin, Penicillins,  Pravastatin, Sulfa antibiotics, Sulfa drugs cross reactors, Tape, and Naproxen sodium   History: Past Medical History:  Diagnosis Date   Abnormal cardiovascular stress test    Carotid arterial disease (HCC)    MODERATE RIGHT   Cerebral AV malformation    DDD (degenerative disc disease)    SEVERE WITH SPINAL STENOSIS   Hypercholesterolemia    Hypertension    MVP (mitral valve prolapse)    MILD   Numbness    LEFT FOOT AND ANKLE   Osteopenia    Presence of permanent cardiac pacemaker    Pseudo-gout    Past Surgical History:  Procedure Laterality Date   APPENDECTOMY     BREAST MASS EXCISION     CARPAL TUNNEL RELEASE     CHOLECYSTECTOMY N/A 04/23/2022   Procedure: LAPAROSCOPIC CHOLECYSTECTOMY;  Surgeon: Abigail Miyamoto, MD;  Location: [facility];  Service: General;  Laterality: N/A;   gallbladder     PACEMAKER IMPLANT N/A 11/07/2021   Procedure: PACEMAKER IMPLANT;  Surgeon: Marinus Maw, MD;  Location: [facility];  Service: Cardiovascular;  Laterality: N/A;   REMOVAL OF PARATHYROID GLAND     STRESS NUCLEAR STUDY     ABNORMAL   TONSILLECTOMY     Family History  Problem Relation Age of Onset   Stroke Mother    Lung cancer Father    Pneumonia Brother    Breast cancer Neg Hx    Social History   Socioeconomic History   Marital status: Widowed    Spouse name: Not on file   Number of children: 3   Years of education: Not on file   Highest education level: Not on file  Occupational History   Not on file  Tobacco Use   Smoking status: Former    Current packs/day: 0.00    Average packs/day: 1 pack/day for 14.0 years (14.0 ttl pk-yrs)    Types: Cigarettes    Start date: 11/08/1954    Quit date: 11/07/1968    Years since quitting: 54.7   Smokeless tobacco: Never  Vaping Use   Vaping status: Never Used  Substance and Sexual Activity   Alcohol use: No   Drug use: No   Sexual activity: Not on file  Other Topics Concern   Not on file  Social History Narrative    [place] [employer]    Right handed    Social Drivers of Health   Financial Resource Strain: Not on file  Food Insecurity: No Food Insecurity (08/05/2023)   Hunger Vital Sign    Worried About Running Out of Food in the Last Year: Never true    Ran Out of Food in the Last Year: Never true  Transportation Needs: No Transportation Needs (04/22/2022)   PRAPARE - Administrator, Civil Service (Medical):  No    Lack of Transportation (Non-Medical): No  Physical Activity: Not on file  Stress: Not on file  Social Connections: Not on file    Tobacco Counseling Counseling given: Not Answered   Clinical Intake:  Pre-visit preparation completed: Yes  Pain : No/denies pain     BMI - recorded: 23.51 Nutritional Status: BMI of 19-24  Normal Nutritional Risks: None Diabetes: No  How often do you need to have someone help you when you read instructions, pamphlets, or other written materials from your doctor or pharmacy?: 2 - Rarely What is the last grade level you completed in school?: high school graduate  Interpreter Needed?: No      Activities of Daily Living    08/05/2023    3:35 PM  In your present state of health, do you have any difficulty performing the following activities:  Hearing? 0  Vision? 0  Difficulty concentrating or making decisions? 0  Walking or climbing stairs? 1  Dressing or bathing? 0  Doing errands, shopping? 0    Patient Care Team: Mahlon Gammon, MD as PCP - General (Internal Medicine) [place], [name], MD as PCP - Cardiology (Cardiology) Van Clines, MD as Consulting Physician (Neurology)  Indicate any recent Medical Services you may have received from other than [facility] providers in the past year (date may be approximate).     Assessment:   This is a routine wellness examination for [name].  Hearing/Vision screen Hearing Screening - Comments:: Patient declined  Vision Screening - Comments:: Yes patient has with dr  Charlotte Sanes    Goals Addressed   None    Depression Screen    08/05/2023    3:34 PM 04/08/2023    1:34 PM 03/19/2023   11:25 AM 06/26/2022    1:39 PM 05/01/2022    9:39 AM 04/17/2022    4:35 PM 03/27/2022    1:00 PM  PHQ 2/9 Scores  PHQ - 2 Score 0 0 0 0 0 0 0    Fall Risk    08/05/2023    3:34 PM 07/23/2023    9:49 AM 06/18/2023    1:50 PM 04/08/2023    1:34 PM 03/19/2023   11:25 AM  Fall Risk   Falls in the past year? 0 0 0 0 0  Number falls in past yr: 0 0 0 0 0  Injury with Fall? 0 0 0 0 0  Risk for fall due to : No Fall Risks  No Fall Risks    Follow up Falls evaluation completed Falls evaluation completed Falls evaluation completed Falls evaluation completed     MEDICARE RISK AT HOME: Medicare Risk at Home Any stairs in or around the home?: No If so, are there any without handrails?: No Home free of loose throw rugs in walkways, pet beds, electrical cords, etc?: Yes Adequate lighting in your home to reduce risk of falls?: Yes Life alert?: Yes Use of a cane, walker or w/c?: Yes Grab bars in the bathroom?: Yes Shower chair or bench in shower?: Yes Elevated toilet seat or a handicapped toilet?: Yes  TIMED UP AND GO:  Was the test performed?  No    Cognitive Function:    08/05/2023    3:35 PM  MMSE - Mini Mental State Exam  Orientation to time 4  Orientation to Place 5  Registration 3  Attention/ Calculation 5  Recall 0  Language- name 2 objects 2  Language- repeat 1  Language- follow 3 step command 3  Language- read & follow direction 1  Write a sentence 1  Copy design 1  Total score 26        Immunizations Immunization History  Administered Date(s) Administered   Fluad Quad(high Dose 65+) 04/16/2022   Fluad Trivalent(High Dose 65+) 04/02/2023   Influenza Split 03/28/2009, 03/17/2010, 03/26/2011, 04/01/2012, 03/05/2013   Influenza, Quadrivalent, Recombinant, Inj, Pf 03/08/2018, 02/13/2019, 02/24/2020   Influenza,inj,Quad PF,6+ Mos 02/25/2014,  03/05/2015   Influenza-Unspecified 02/09/2014, 02/25/2016, 04/06/2017, 04/25/2021   Moderna Covid-19 Vaccine Bivalent Booster 79yrs & up 04/02/2023   Moderna Sars-Covid-2 Vaccination 07/17/2019, 08/14/2019, 02/24/2020, 04/26/2020   Pneumococcal Conjugate-13 11/26/2014   Pneumococcal Polysaccharide-23 06/23/2003, 11/10/2012   Pneumococcal-Unspecified 06/23/2003, 11/10/2012   Td (Adult), 2 Lf Tetanus Toxid, Preservative Free 06/11/2000, 11/06/2011   Td (Adult),5 Lf Tetanus Toxid, Preservative Free 06/11/2000, 11/06/2011   Unspecified SARS-COV-2 Vaccination 04/28/2021   Zoster Recombinant(Shingrix) 01/09/2019, 05/08/2019   Zoster, Live 01/15/2006, 01/15/2019    TDAP status: Due, Education has been provided regarding the importance of this vaccine. Advised may receive this vaccine at local pharmacy or Health Dept. Aware to provide a copy of the vaccination record if obtained from local pharmacy or Health Dept. Verbalized acceptance and understanding.  Flu Vaccine status: Up to date  Pneumococcal vaccine status: Up to date  Covid-19 vaccine status: Completed vaccines  Qualifies for Shingles Vaccine? Yes   Zostavax completed Yes   Shingrix Completed?: Yes  Screening Tests Health Maintenance  Topic Date Due   DEXA SCAN  Never done   COVID-19 Vaccine (7 - 2024-25 season) 08/21/2023 (Originally 05/28/2023)   Medicare Annual Wellness (AWV)  08/04/2024   Pneumonia Vaccine 59+ Years old  Completed   INFLUENZA VACCINE  Completed   Zoster Vaccines- Shingrix  Completed   HPV VACCINES  Aged Out   DTaP/Tdap/Td  Discontinued    Health Maintenance  Health Maintenance Due  Topic Date Due   DEXA SCAN  Never done    Colorectal cancer screening: No longer required.   Mammogram status: No longer required due to aged.  Bone Density status: Ordered 08/05/23. Pt provided with contact info and advised to call to schedule appt.  Lung Cancer Screening: (Low Dose CT Chest recommended if Age 49-80  years, 20 pack-year currently smoking OR have quit w/in 15years.) does not qualify.   Lung Cancer Screening Referral: Na  Additional Screening:  Hepatitis C Screening: does not qualify; Completed NA  Vision Screening: Recommended annual ophthalmology exams for early detection of glaucoma and other disorders of the eye. Is the patient up to date with their annual eye exam?  Yes  Who is the provider or what is the name of the office in which the patient attends annual eye exams? [email or web address] If pt is not established with a provider, would they like to be referred to a provider to establish care? No .   Dental Screening: Recommended annual dental exams for proper oral hygiene  Diabetic Foot Exam:   Community Resource Referral / Chronic Care Management: CRR required this visit?  No   CCM required this visit?  No     Plan:     I have personally reviewed and noted the following in the patient's chart:   Medical and social history Use of alcohol, tobacco or illicit drugs  Current medications and supplements including opioid prescriptions. Patient is not currently taking opioid prescriptions. Functional ability and status Nutritional status Physical activity Advanced directives List of other physicians Hospitalizations, surgeries, and ER visits in previous 12 months Vitals  Screenings to include cognitive, depression, and falls Referrals and appointments  In addition, I have reviewed and discussed with patient certain preventive protocols, quality metrics, and best practice recommendations. A written personalized care plan for preventive services as well as general preventive health recommendations were provided to patient.     Fletcher Anon, NP   08/05/2023   After Visit Summary: Given to patient  Nurse Notes: she really wants to come off some of her medications. We will d/c the align. She has labs coming up later this week and will need to discuss her anemia and symptoms of  neuropathy at another apt.

## 2023-08-06 ENCOUNTER — Other Ambulatory Visit: Payer: Self-pay | Admitting: Internal Medicine

## 2023-08-06 DIAGNOSIS — E2839 Other primary ovarian failure: Secondary | ICD-10-CM

## 2023-08-07 ENCOUNTER — Ambulatory Visit (INDEPENDENT_AMBULATORY_CARE_PROVIDER_SITE_OTHER): Payer: Medicare Other

## 2023-08-07 DIAGNOSIS — I442 Atrioventricular block, complete: Secondary | ICD-10-CM | POA: Diagnosis not present

## 2023-08-08 DIAGNOSIS — D649 Anemia, unspecified: Secondary | ICD-10-CM | POA: Diagnosis not present

## 2023-08-08 LAB — CUP PACEART REMOTE DEVICE CHECK
Battery Remaining Longevity: 133 mo
Battery Voltage: 3.03 V
Brady Statistic AP VP Percent: 0 %
Brady Statistic AP VS Percent: 0 %
Brady Statistic AS VP Percent: 90.23 %
Brady Statistic AS VS Percent: 9.77 %
Brady Statistic RA Percent Paced: 0 %
Brady Statistic RV Percent Paced: 90.23 %
Date Time Interrogation Session: 20250225210300
Implantable Lead Connection Status: 753985
Implantable Lead Connection Status: 753985
Implantable Lead Implant Date: 20230530
Implantable Lead Implant Date: 20230530
Implantable Lead Location: 753859
Implantable Lead Location: 753860
Implantable Lead Model: 3830
Implantable Lead Model: 5076
Implantable Pulse Generator Implant Date: 20230530
Lead Channel Impedance Value: 190 Ohm
Lead Channel Impedance Value: 304 Ohm
Lead Channel Impedance Value: 418 Ohm
Lead Channel Impedance Value: 475 Ohm
Lead Channel Pacing Threshold Amplitude: 0.75 V
Lead Channel Pacing Threshold Amplitude: 1.375 V
Lead Channel Pacing Threshold Pulse Width: 0.4 ms
Lead Channel Pacing Threshold Pulse Width: 0.4 ms
Lead Channel Sensing Intrinsic Amplitude: 0.125 mV
Lead Channel Sensing Intrinsic Amplitude: 0.25 mV
Lead Channel Sensing Intrinsic Amplitude: 8 mV
Lead Channel Sensing Intrinsic Amplitude: 8 mV
Lead Channel Setting Pacing Amplitude: 2 V
Lead Channel Setting Pacing Pulse Width: 0.4 ms
Lead Channel Setting Sensing Sensitivity: 1.2 mV
Zone Setting Status: 755011

## 2023-08-08 LAB — IRON,TIBC AND FERRITIN PANEL
Iron: 66
TIBC: 192
UIBC: 126

## 2023-08-11 ENCOUNTER — Encounter: Payer: Self-pay | Admitting: Internal Medicine

## 2023-08-13 ENCOUNTER — Non-Acute Institutional Stay: Payer: Medicare Other | Admitting: Internal Medicine

## 2023-08-13 ENCOUNTER — Encounter: Payer: Self-pay | Admitting: Internal Medicine

## 2023-08-13 VITALS — BP 138/60 | HR 70 | Temp 98.0°F | Resp 17 | Ht 61.0 in | Wt 119.0 lb

## 2023-08-13 DIAGNOSIS — D631 Anemia in chronic kidney disease: Secondary | ICD-10-CM | POA: Diagnosis not present

## 2023-08-13 DIAGNOSIS — N1831 Chronic kidney disease, stage 3a: Secondary | ICD-10-CM | POA: Diagnosis not present

## 2023-08-13 DIAGNOSIS — G609 Hereditary and idiopathic neuropathy, unspecified: Secondary | ICD-10-CM

## 2023-08-13 DIAGNOSIS — H6123 Impacted cerumen, bilateral: Secondary | ICD-10-CM

## 2023-08-13 DIAGNOSIS — I10 Essential (primary) hypertension: Secondary | ICD-10-CM | POA: Diagnosis not present

## 2023-08-13 DIAGNOSIS — I5032 Chronic diastolic (congestive) heart failure: Secondary | ICD-10-CM | POA: Diagnosis not present

## 2023-08-13 NOTE — Progress Notes (Signed)
 Location:  [facility] features editor of Service:  Clinic (12)  Provider:   Code Status: DNR Goals of Care:     08/13/2023   10:31 AM  Advanced Directives  Does Patient Have a Medical Advance Directive? Yes  Type of Advance Directive Living will;Out of facility DNR (pink MOST or yellow form)  Pre-existing out of facility DNR order (yellow form or pink MOST form) Yellow form placed in chart (order not valid for inpatient use)     Chief Complaint  Patient presents with   Medical Management of Chronic Issues    Follow up. Discuss the need for DEXA. Discuss problems with both feet    HPI: Patient is a 88 y.o. female seen today for medical management of chronic diseases.   She is in AL in [place]     Has history of chronic diarrhea and Previous h/o C Diff colitis S/p PPM placement on 05/30 for CHB  A Fib Amiodarone tried but then taken off due to GI side effects Not on anticoagulation due to cerebral AVMs.  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 teen Had a work-up at [facility] and has been on Dilantin since then History of unhealed right humerus shaft fracture Peripheral neuropathy   Discussed the use of AI scribe software for clinical note transcription with the patient, who gave verbal consent to proceed.  History of Present Illness   Peripheral Neuropathy The patient, with a history of neuropathy, presents with worsening symptoms. She describes a sensation of numbness in both feet, similar to the feeling of a limb 'falling asleep.' The numbness, initially localized to the feet, has begun to ascend the legs. The symptoms are most bothersome when changing positions, causing the patient to feel unstable and 'get my feet all tangled up.' She denies nocturnal symptoms and reports sleeping well.  The patient has consulted a neurologist who suggested increasing vitamin B12 might help; however,  her B12 levels were found to be normal. The patient is not currently on a B12 supplement. The neurologist also mentioned gabapentin, a medication that could help control the symptoms, but the patient does not recall this discussion.  The patient's mobility has been significantly affected by the neuropathy, to the point where she is considering getting a power chair for mobility, especially for longer distances. She expresses a desire for increased independence and frustration with the cost of assistance for mobility.  The patient also mentions a recent episode of severe diarrhea, suspected to be due to a 'bad fruit cup.' She reports that the diarrhea has since resolved. She has noticed some weight loss, which she attributes to a temporary change in eating habits due to the diarrhea.  Wt Readings from Last 3 Encounters:  08/13/23 119 lb (54 kg)  08/05/23 124 lb 6.4 oz (56.4 kg)  07/23/23 120 lb 12.8 oz (54.8 kg)    Past Medical History:  Diagnosis Date   Abnormal cardiovascular stress test    Carotid arterial disease (HCC)    MODERATE RIGHT   Cerebral AV malformation    DDD (degenerative disc disease)    SEVERE WITH SPINAL STENOSIS   Hypercholesterolemia    Hypertension    MVP (mitral valve prolapse)    MILD   Numbness    LEFT FOOT AND ANKLE   Osteopenia    Presence of permanent cardiac pacemaker    Pseudo-gout     Past Surgical  History:  Procedure Laterality Date   APPENDECTOMY     BREAST MASS EXCISION     CARPAL TUNNEL RELEASE     CHOLECYSTECTOMY N/A 04/23/2022   Procedure: LAPAROSCOPIC CHOLECYSTECTOMY;  Surgeon: Abigail Miyamoto, MD;  Location: [facility];  Service: General;  Laterality: N/A;   gallbladder     PACEMAKER IMPLANT N/A 11/07/2021   Procedure: PACEMAKER IMPLANT;  Surgeon: Marinus Maw, MD;  Location: [facility];  Service: Cardiovascular;  Laterality: N/A;   REMOVAL OF PARATHYROID GLAND     STRESS NUCLEAR STUDY     ABNORMAL   TONSILLECTOMY       Allergies  Allergen Reactions   Aleve [Naproxen] Other (See Comments)   Azithromycin Other (See Comments)    Reaction not recalled   Penicillins Hives and Other (See Comments)    Bad reaction as a child after a shot of Penicillin   Pravastatin Other (See Comments)    Reaction not recalled   Sulfa Antibiotics Other (See Comments)   Sulfa Drugs Cross Reactors Other (See Comments)    Reaction not recalled- stated she can take it "in small doses"   Tape Itching and Other (See Comments)    Cannot wear for an extended period of time   Naproxen Sodium Rash    Outpatient Encounter Medications as of 08/13/2023  Medication Sig   acetaminophen (TYLENOL) 500 MG tablet Take 500 mg by mouth every 6 (six) hours as needed for mild pain or headache.   amLODipine (NORVASC) 5 MG tablet Take 1 tablet (5 mg total) by mouth daily.   b complex vitamins capsule Take 1 capsule by mouth daily.   dextromethorphan-guaiFENesin (ROBITUSSIN-DM) 10-100 MG/5ML liquid Take 10 mLs by mouth every 6 (six) hours as needed for cough.   fluticasone (FLONASE) 50 MCG/ACT nasal spray Place into both nostrils daily.   hydrALAZINE (APRESOLINE) 10 MG tablet Take 10 mg by mouth daily as needed.   hydrALAZINE (APRESOLINE) 50 MG tablet Take 50 mg by mouth 4 (four) times daily.   ipratropium-albuterol (DUONEB) 0.5-2.5 (3) MG/3ML SOLN Take 3 mLs by nebulization every 6 (six) hours as needed.   Iron, Ferrous Sulfate, 325 (65 Fe) MG TABS Take 325 mg by mouth 3 (three) times a week.   loperamide (IMODIUM A-D) 2 MG tablet Take 2 mg by mouth 3 (three) times daily as needed for diarrhea or loose stools.   magnesium oxide (MAG-OX) 400 (240 Mg) MG tablet Take 400 mg by mouth 2 (two) times daily.   Multiple Vitamins-Iron (MULTI-VITAMIN/IRON PO) Take by mouth.   ondansetron (ZOFRAN-ODT) 4 MG disintegrating tablet Take 4 mg by mouth every 4 (four) hours as needed for nausea or vomiting.   PHENobarbital (LUMINAL) 64.8 MG tablet Take 1 tablet  (64.8 mg total) by mouth daily. Take 1 tablet every morning   phenytoin (DILANTIN) 100 MG ER capsule Take 200 mg by mouth daily.   Polyethyl Glycol-Propyl Glycol (SYSTANE) 0.4-0.3 % SOLN Place 1 drop into both eyes in the morning and at bedtime.   torsemide (DEMADEX) 20 MG tablet Take 1 tablet (20 mg total) by mouth 3 (three) times a week.   triamcinolone cream (KENALOG) 0.1 % Apply 1 Application topically 2 (two) times daily as needed.   valsartan (DIOVAN) 160 MG tablet Take 1 tablet (160 mg total) by mouth 2 (two) times daily.   No facility-administered encounter medications on file as of 08/13/2023.    Review of Systems:  Review of Systems  Constitutional:  Positive for  appetite change. Negative for activity change.  HENT: Negative.    Respiratory:  Negative for cough and shortness of breath.   Cardiovascular:  Negative for leg swelling.  Gastrointestinal:  Negative for constipation.  Genitourinary: Negative.   Musculoskeletal:  Positive for gait problem. Negative for arthralgias and myalgias.  Skin: Negative.   Neurological:  Positive for numbness. Negative for dizziness and weakness.  Psychiatric/Behavioral:  Negative for confusion, dysphoric mood and sleep disturbance.     Health Maintenance  Topic Date Due   DEXA SCAN  Never done   COVID-19 Vaccine (7 - 2024-25 season) 08/21/2023 (Originally 05/28/2023)   Medicare Annual Wellness (AWV)  08/04/2024   Pneumonia Vaccine 35+ Years old  Completed   INFLUENZA VACCINE  Completed   Zoster Vaccines- Shingrix  Completed   HPV VACCINES  Aged Out   DTaP/Tdap/Td  Discontinued    Physical Exam: Vitals:   08/13/23 1028  BP: 138/60  Pulse: 70  Resp: 17  Temp: 98 F (36.7 C)  TempSrc: Temporal  SpO2: 97%  Weight: 119 lb (54 kg)  Height: 5\' 1"  (1.549 m)   Body mass index is 22.48 kg/m. Physical Exam Vitals reviewed.  Constitutional:      Appearance: Normal appearance.  HENT:     Head: Normocephalic.     Ears:      Comments: Ear Wax Both ears    Nose: Nose normal.     Mouth/Throat:     Mouth: Mucous membranes are moist.     Pharynx: Oropharynx is clear.  Eyes:     Pupils: Pupils are equal, round, and reactive to light.  Cardiovascular:     Rate and Rhythm: Normal rate and regular rhythm.     Pulses: Normal pulses.     Heart sounds: Normal heart sounds. No murmur heard. Pulmonary:     Effort: Pulmonary effort is normal.     Breath sounds: Normal breath sounds.  Abdominal:     General: Abdomen is flat. Bowel sounds are normal.     Palpations: Abdomen is soft.  Musculoskeletal:        General: No swelling.     Cervical back: Neck supple.  Skin:    General: Skin is warm.  Neurological:     General: No focal deficit present.     Mental Status: She is alert and oriented to person, place, and time.  Psychiatric:        Mood and Affect: Mood normal.        Thought Content: Thought content normal.     Labs reviewed: Basic Metabolic Panel: Recent Labs    08/28/22 1131 09/18/22 0000 12/20/22 1015 12/27/22 1154 01/07/23 0000 04/02/23 0000 04/04/23 0000 06/13/23 0000 06/20/23 0000  NA 141   < > 138 139   < > 142 142 131* 141  K 5.6*   < > 5.4* 4.9   < > 4.0 4.0 4.3 4.3  CL 104   < > 103 101   < > 101 101 101 104  CO2 20   < > 21 22   < > 28* 28* 24* 24*  GLUCOSE 95  --  87 98  --   --   --   --   --   BUN 38*   < > 34* 30*   < > 45* 45* 62* 57*  CREATININE 1.20*   < > 1.22* 1.19*   < > 1.3* 1.3* 1.4* 1.5*  CALCIUM 9.7   < > 9.9 10.1   < >  9.1  --  9.1 9.4   < > = values in this interval not displayed.   Liver Function Tests: Recent Labs    09/18/22 0000 06/13/23 0000  AST 31 30  ALT 27 21  ALKPHOS 3.9* 74  ALBUMIN 3.9 3.5   No results for input(s): "LIPASE", "AMYLASE" in the last 8760 hours. No results for input(s): "AMMONIA" in the last 8760 hours. CBC: Recent Labs    04/02/23 0000 04/04/23 0000 06/20/23 0000  WBC 4.4 4.4 4.6  HGB 9.3* 9.3* 8.4*  HCT 27* 27* 24*   PLT 207 207 191   Lipid Panel: Recent Labs    06/13/23 0000  HDL 97*  LDLCALC 97  TRIG 51   Lab Results  Component Value Date   HGBA1C 204 06/13/2023    Procedures since last visit: CUP PACEART REMOTE DEVICE CHECK Result Date: 08/08/2023 Scheduled remote reviewed. Normal device function.  Next remote 91 days. LA, CVRS   Assessment/Plan 1. Idiopathic peripheral neuropathy (Primary) Worsening neuropathy in feet and legs affecting mobility. . Gabapentin discussed but patient hesitant due to side effects and medication load.  - Discuss gabapentin if symptoms worsen. - Encourage walker use for stability. Referral Made for Eval for Power chair per therapy 2. Anemia due to stage 3a chronic kidney disease (HCC) Iron Stores are good On Iron now B 12 was also good Occult negative Repeat HGB in 3 months if no improvement then refer to Hematologist  3. Primary hypertension Norvasc and Valsartan and Hydralazine  4. Chronic diastolic congestive heart failure (HCC) Doing well in Torsemide  5. Bilateral impacted cerumen Ear wash Written    6 Weight Loss Weight loss due to diarrhea and dietary restrictions. Appetite good, plans to resume normal diet. - Encourage nutritional supplements like Ensure. 7 Diarrhea Now resolved per Patient 8 PAF Off amiodarone due to side effects No Anticoagulation due to AVMS Follows with EP  .9 Heart block AV complete (HCC) S/p PPM   10 Cerebral AV malformation Follows with Neurology     11 History of complex partial epilepsy On Phenobarb and  Dilantin        Labs/tests ordered:   Next appt:  08/13/2023

## 2023-08-14 ENCOUNTER — Encounter: Payer: Medicare Other | Admitting: Internal Medicine

## 2023-08-23 DIAGNOSIS — R2689 Other abnormalities of gait and mobility: Secondary | ICD-10-CM | POA: Diagnosis not present

## 2023-08-23 DIAGNOSIS — I5032 Chronic diastolic (congestive) heart failure: Secondary | ICD-10-CM | POA: Diagnosis not present

## 2023-08-23 DIAGNOSIS — G609 Hereditary and idiopathic neuropathy, unspecified: Secondary | ICD-10-CM | POA: Diagnosis not present

## 2023-08-23 DIAGNOSIS — R262 Difficulty in walking, not elsewhere classified: Secondary | ICD-10-CM | POA: Diagnosis not present

## 2023-08-23 DIAGNOSIS — M6281 Muscle weakness (generalized): Secondary | ICD-10-CM | POA: Diagnosis not present

## 2023-08-27 DIAGNOSIS — I5032 Chronic diastolic (congestive) heart failure: Secondary | ICD-10-CM | POA: Diagnosis not present

## 2023-08-27 DIAGNOSIS — M6281 Muscle weakness (generalized): Secondary | ICD-10-CM | POA: Diagnosis not present

## 2023-08-27 DIAGNOSIS — G609 Hereditary and idiopathic neuropathy, unspecified: Secondary | ICD-10-CM | POA: Diagnosis not present

## 2023-08-27 DIAGNOSIS — R262 Difficulty in walking, not elsewhere classified: Secondary | ICD-10-CM | POA: Diagnosis not present

## 2023-08-27 DIAGNOSIS — R2689 Other abnormalities of gait and mobility: Secondary | ICD-10-CM | POA: Diagnosis not present

## 2023-08-28 DIAGNOSIS — R262 Difficulty in walking, not elsewhere classified: Secondary | ICD-10-CM | POA: Diagnosis not present

## 2023-08-28 DIAGNOSIS — I5032 Chronic diastolic (congestive) heart failure: Secondary | ICD-10-CM | POA: Diagnosis not present

## 2023-08-28 DIAGNOSIS — G609 Hereditary and idiopathic neuropathy, unspecified: Secondary | ICD-10-CM | POA: Diagnosis not present

## 2023-08-28 DIAGNOSIS — M6281 Muscle weakness (generalized): Secondary | ICD-10-CM | POA: Diagnosis not present

## 2023-08-28 DIAGNOSIS — R2689 Other abnormalities of gait and mobility: Secondary | ICD-10-CM | POA: Diagnosis not present

## 2023-09-01 DIAGNOSIS — R262 Difficulty in walking, not elsewhere classified: Secondary | ICD-10-CM | POA: Diagnosis not present

## 2023-09-01 DIAGNOSIS — M6281 Muscle weakness (generalized): Secondary | ICD-10-CM | POA: Diagnosis not present

## 2023-09-01 DIAGNOSIS — R2689 Other abnormalities of gait and mobility: Secondary | ICD-10-CM | POA: Diagnosis not present

## 2023-09-01 DIAGNOSIS — I5032 Chronic diastolic (congestive) heart failure: Secondary | ICD-10-CM | POA: Diagnosis not present

## 2023-09-01 DIAGNOSIS — G609 Hereditary and idiopathic neuropathy, unspecified: Secondary | ICD-10-CM | POA: Diagnosis not present

## 2023-09-03 DIAGNOSIS — M6281 Muscle weakness (generalized): Secondary | ICD-10-CM | POA: Diagnosis not present

## 2023-09-03 DIAGNOSIS — R2689 Other abnormalities of gait and mobility: Secondary | ICD-10-CM | POA: Diagnosis not present

## 2023-09-03 DIAGNOSIS — R262 Difficulty in walking, not elsewhere classified: Secondary | ICD-10-CM | POA: Diagnosis not present

## 2023-09-03 DIAGNOSIS — I5032 Chronic diastolic (congestive) heart failure: Secondary | ICD-10-CM | POA: Diagnosis not present

## 2023-09-03 DIAGNOSIS — G609 Hereditary and idiopathic neuropathy, unspecified: Secondary | ICD-10-CM | POA: Diagnosis not present

## 2023-09-05 DIAGNOSIS — R262 Difficulty in walking, not elsewhere classified: Secondary | ICD-10-CM | POA: Diagnosis not present

## 2023-09-05 DIAGNOSIS — M6281 Muscle weakness (generalized): Secondary | ICD-10-CM | POA: Diagnosis not present

## 2023-09-05 DIAGNOSIS — I5032 Chronic diastolic (congestive) heart failure: Secondary | ICD-10-CM | POA: Diagnosis not present

## 2023-09-05 DIAGNOSIS — G609 Hereditary and idiopathic neuropathy, unspecified: Secondary | ICD-10-CM | POA: Diagnosis not present

## 2023-09-05 DIAGNOSIS — R2689 Other abnormalities of gait and mobility: Secondary | ICD-10-CM | POA: Diagnosis not present

## 2023-09-10 DIAGNOSIS — G609 Hereditary and idiopathic neuropathy, unspecified: Secondary | ICD-10-CM | POA: Diagnosis not present

## 2023-09-10 DIAGNOSIS — I5032 Chronic diastolic (congestive) heart failure: Secondary | ICD-10-CM | POA: Diagnosis not present

## 2023-09-10 DIAGNOSIS — M6281 Muscle weakness (generalized): Secondary | ICD-10-CM | POA: Diagnosis not present

## 2023-09-10 DIAGNOSIS — R262 Difficulty in walking, not elsewhere classified: Secondary | ICD-10-CM | POA: Diagnosis not present

## 2023-09-10 DIAGNOSIS — R2689 Other abnormalities of gait and mobility: Secondary | ICD-10-CM | POA: Diagnosis not present

## 2023-09-10 NOTE — Addendum Note (Signed)
 Addended by: Elease Etienne A on: 09/10/2023 12:52 PM   Modules accepted: Orders

## 2023-09-10 NOTE — Progress Notes (Signed)
 Remote pacemaker transmission.

## 2023-09-12 DIAGNOSIS — I5032 Chronic diastolic (congestive) heart failure: Secondary | ICD-10-CM | POA: Diagnosis not present

## 2023-09-12 DIAGNOSIS — M6281 Muscle weakness (generalized): Secondary | ICD-10-CM | POA: Diagnosis not present

## 2023-09-12 DIAGNOSIS — R262 Difficulty in walking, not elsewhere classified: Secondary | ICD-10-CM | POA: Diagnosis not present

## 2023-09-12 DIAGNOSIS — R2689 Other abnormalities of gait and mobility: Secondary | ICD-10-CM | POA: Diagnosis not present

## 2023-09-12 DIAGNOSIS — G609 Hereditary and idiopathic neuropathy, unspecified: Secondary | ICD-10-CM | POA: Diagnosis not present

## 2023-09-13 DIAGNOSIS — R2689 Other abnormalities of gait and mobility: Secondary | ICD-10-CM | POA: Diagnosis not present

## 2023-09-13 DIAGNOSIS — R262 Difficulty in walking, not elsewhere classified: Secondary | ICD-10-CM | POA: Diagnosis not present

## 2023-09-13 DIAGNOSIS — M6281 Muscle weakness (generalized): Secondary | ICD-10-CM | POA: Diagnosis not present

## 2023-09-13 DIAGNOSIS — G609 Hereditary and idiopathic neuropathy, unspecified: Secondary | ICD-10-CM | POA: Diagnosis not present

## 2023-09-13 DIAGNOSIS — I5032 Chronic diastolic (congestive) heart failure: Secondary | ICD-10-CM | POA: Diagnosis not present

## 2023-09-17 ENCOUNTER — Encounter: Payer: Self-pay | Admitting: Orthopedic Surgery

## 2023-09-17 ENCOUNTER — Non-Acute Institutional Stay: Payer: Self-pay | Admitting: Orthopedic Surgery

## 2023-09-17 DIAGNOSIS — D631 Anemia in chronic kidney disease: Secondary | ICD-10-CM | POA: Diagnosis not present

## 2023-09-17 DIAGNOSIS — H6121 Impacted cerumen, right ear: Secondary | ICD-10-CM | POA: Diagnosis not present

## 2023-09-17 DIAGNOSIS — R195 Other fecal abnormalities: Secondary | ICD-10-CM | POA: Diagnosis not present

## 2023-09-17 DIAGNOSIS — R2689 Other abnormalities of gait and mobility: Secondary | ICD-10-CM | POA: Diagnosis not present

## 2023-09-17 DIAGNOSIS — N1831 Chronic kidney disease, stage 3a: Secondary | ICD-10-CM

## 2023-09-17 DIAGNOSIS — M6281 Muscle weakness (generalized): Secondary | ICD-10-CM | POA: Diagnosis not present

## 2023-09-17 DIAGNOSIS — G609 Hereditary and idiopathic neuropathy, unspecified: Secondary | ICD-10-CM | POA: Diagnosis not present

## 2023-09-17 DIAGNOSIS — R262 Difficulty in walking, not elsewhere classified: Secondary | ICD-10-CM | POA: Diagnosis not present

## 2023-09-17 DIAGNOSIS — J302 Other seasonal allergic rhinitis: Secondary | ICD-10-CM | POA: Diagnosis not present

## 2023-09-17 DIAGNOSIS — I5032 Chronic diastolic (congestive) heart failure: Secondary | ICD-10-CM | POA: Diagnosis not present

## 2023-09-17 MED ORDER — POLYETHYLENE GLYCOL 3350 17 GM/SCOOP PO POWD: 17.0000 g | Freq: Two times a day (BID) | ORAL | Status: DC

## 2023-09-17 NOTE — Progress Notes (Signed)
 Location:  [facility] Room Number: 626/A Place of Service:  ALF ([phone]) Provider:  Octavia Heir, NP   Mahlon Gammon, MD  Patient Care Team: Mahlon Gammon, MD as PCP - General (Internal Medicine) [place], [name], MD as PCP - Cardiology (Cardiology) Van Clines, MD as Consulting Physician (Neurology)  Extended Emergency Contact Information Primary Emergency Contact: [name],[name] Mobile Phone: [phone] Relation: Daughter Secondary Emergency Contact: VERNIA, TEEM Mobile Phone: [phone] Relation: Son  Code Status:  DNR Goals of care: Advanced Directive information    08/13/2023   10:31 AM  Advanced Directives  Does Patient Have a Medical Advance Directive? Yes  Type of Advance Directive Living will;Out of facility DNR (pink MOST or yellow form)  Pre-existing out of facility DNR order (yellow form or pink MOST form) Yellow form placed in chart (order not valid for inpatient use)     Chief Complaint  Patient presents with   Acute Visit    Loose stools, nasal congestion    HPI:  Pt is a 88 y.o. female seen today for acute visit due to loose stools and nasal congestion.   She currently resides on the assisted living unit at [facility]. PMH: aortic atherosclerosis, HTN, MVP, PAF, CHB s/p pacemaker 05/30, neuropathy, encephalopathy, epilepsy, iron deficiency anemia and low back pain.   H/o c.diff colitis. She reports a few episodes of loose stools 04/07. Nursing gave her one dose of Imodium and symptoms improved. No episodes today. Describes stools as brown and "leafy." Denies fever, abdominal pain, N/V or bloody stools. Lack of appetite at this time. She does use anusol prn for hemorrhoids. Unable to recall last normal bowel movement.   Increased rhinorrhea, sneezing and itchy eyes since going outside yesterday. She is on Flonase daily. Covid/flu negative today.   Past Medical History:  Diagnosis Date   Abnormal cardiovascular  stress test    Carotid arterial disease (HCC)    MODERATE RIGHT   Cerebral AV malformation    DDD (degenerative disc disease)    SEVERE WITH SPINAL STENOSIS   Hypercholesterolemia    Hypertension    MVP (mitral valve prolapse)    MILD   Numbness    LEFT FOOT AND ANKLE   Osteopenia    Presence of permanent cardiac pacemaker    Pseudo-gout    Past Surgical History:  Procedure Laterality Date   APPENDECTOMY     BREAST MASS EXCISION     CARPAL TUNNEL RELEASE     CHOLECYSTECTOMY N/A 04/23/2022   Procedure: LAPAROSCOPIC CHOLECYSTECTOMY;  Surgeon: Abigail Miyamoto, MD;  Location: [facility];  Service: General;  Laterality: N/A;   gallbladder     PACEMAKER IMPLANT N/A 11/07/2021   Procedure: PACEMAKER IMPLANT;  Surgeon: Marinus Maw, MD;  Location: [facility];  Service: Cardiovascular;  Laterality: N/A;   REMOVAL OF PARATHYROID GLAND     STRESS NUCLEAR STUDY     ABNORMAL   TONSILLECTOMY      Allergies  Allergen Reactions   Aleve [Naproxen] Other (See Comments)   Azithromycin Other (See Comments)    Reaction not recalled   Penicillins Hives and Other (See Comments)    Bad reaction as a child after a shot of Penicillin   Pravastatin Other (See Comments)    Reaction not recalled   Sulfa Antibiotics Other (See Comments)   Sulfa Drugs Cross Reactors Other (See Comments)    Reaction not recalled- stated she can take it "in small doses"  Tape Itching and Other (See Comments)    Cannot wear for an extended period of time   Naproxen Sodium Rash    Outpatient Encounter Medications as of 09/17/2023  Medication Sig   acetaminophen (TYLENOL) 500 MG tablet Take 500 mg by mouth every 6 (six) hours as needed for mild pain or headache.   amLODipine (NORVASC) 5 MG tablet Take 1 tablet (5 mg total) by mouth daily.   b complex vitamins capsule Take 1 capsule by mouth daily.   dextromethorphan-guaiFENesin (ROBITUSSIN-DM) 10-100 MG/5ML liquid Take 10 mLs by mouth every 6 (six) hours as  needed for cough.   fluticasone (FLONASE) 50 MCG/ACT nasal spray Place into both nostrils daily.   hydrALAZINE (APRESOLINE) 10 MG tablet Take 10 mg by mouth daily as needed.   hydrALAZINE (APRESOLINE) 50 MG tablet Take 50 mg by mouth 4 (four) times daily.   ipratropium-albuterol (DUONEB) 0.5-2.5 (3) MG/3ML SOLN Take 3 mLs by nebulization every 6 (six) hours as needed.   Iron, Ferrous Sulfate, 325 (65 Fe) MG TABS Take 325 mg by mouth 3 (three) times a week.   loperamide (IMODIUM A-D) 2 MG tablet Take 2 mg by mouth 3 (three) times daily as needed for diarrhea or loose stools.   magnesium oxide (MAG-OX) 400 (240 Mg) MG tablet Take 400 mg by mouth 2 (two) times daily.   Multiple Vitamins-Iron (MULTI-VITAMIN/IRON PO) Take by mouth.   ondansetron (ZOFRAN-ODT) 4 MG disintegrating tablet Take 4 mg by mouth every 4 (four) hours as needed for nausea or vomiting.   PHENobarbital (LUMINAL) 64.8 MG tablet Take 1 tablet (64.8 mg total) by mouth daily. Take 1 tablet every morning   phenytoin (DILANTIN) 100 MG ER capsule Take 200 mg by mouth daily.   Polyethyl Glycol-Propyl Glycol (SYSTANE) 0.4-0.3 % SOLN Place 1 drop into both eyes in the morning and at bedtime.   torsemide (DEMADEX) 20 MG tablet Take 1 tablet (20 mg total) by mouth 3 (three) times a week.   triamcinolone cream (KENALOG) 0.1 % Apply 1 Application topically 2 (two) times daily as needed.   valsartan (DIOVAN) 160 MG tablet Take 1 tablet (160 mg total) by mouth 2 (two) times daily.   No facility-administered encounter medications on file as of 09/17/2023.    Review of Systems  Constitutional:  Positive for fatigue. Negative for fever.  HENT:  Positive for congestion, rhinorrhea and sneezing. Negative for ear pain, sore throat and trouble swallowing.   Eyes:  Positive for itching. Negative for visual disturbance.  Respiratory:  Negative for cough and shortness of breath.   Cardiovascular:  Negative for chest pain and leg swelling.   Gastrointestinal:  Positive for abdominal distention, constipation and diarrhea. Negative for abdominal pain, blood in stool, nausea and vomiting.  Musculoskeletal:  Positive for gait problem. Negative for back pain.  Neurological:  Positive for weakness. Negative for dizziness.  Psychiatric/Behavioral:  Negative for dysphoric mood. The patient is not nervous/anxious.     Immunization History  Administered Date(s) Administered   Fluad Quad(high Dose 65+) 04/16/2022   Fluad Trivalent(High Dose 65+) 04/02/2023   Influenza Split 03/28/2009, 03/17/2010, 03/26/2011, 04/01/2012, 03/05/2013   Influenza, Quadrivalent, Recombinant, Inj, Pf 03/08/2018, 02/13/2019, 02/24/2020   Influenza,inj,Quad PF,6+ Mos 02/25/2014, 03/05/2015   Influenza-Unspecified 02/09/2014, 02/25/2016, 04/06/2017, 04/25/2021   Moderna Covid-19 Vaccine Bivalent Booster 60yrs & up 04/02/2023   Moderna Sars-Covid-2 Vaccination 07/17/2019, 08/14/2019, 02/24/2020, 04/26/2020   Pneumococcal Conjugate-13 11/26/2014   Pneumococcal Polysaccharide-23 06/23/2003, 11/10/2012   Pneumococcal-Unspecified 06/23/2003, 11/10/2012  Td (Adult), 2 Lf Tetanus Toxid, Preservative Free 06/11/2000, 11/06/2011   Td (Adult),5 Lf Tetanus Toxid, Preservative Free 06/11/2000, 11/06/2011   Unspecified SARS-COV-2 Vaccination 04/28/2021   Zoster Recombinant(Shingrix) 01/09/2019, 05/08/2019   Zoster, Live 01/15/2006, 01/15/2019   Pertinent  Health Maintenance Due  Topic Date Due   DEXA SCAN  Never done   INFLUENZA VACCINE  01/10/2024      04/08/2023    1:34 PM 06/18/2023    1:50 PM 07/23/2023    9:49 AM 08/05/2023    3:34 PM 08/13/2023   10:30 AM  Fall Risk  Falls in the past year? 0 0 0 0 0  Was there an injury with Fall? 0 0 0 0 0  Fall Risk Category Calculator 0 0 0 0 0  Patient at Risk for Falls Due to  No Fall Risks  No Fall Risks Impaired balance/gait;Impaired mobility  Fall risk Follow up Falls evaluation completed Falls evaluation completed  Falls evaluation completed Falls evaluation completed Falls evaluation completed   Functional Status Survey:    Vitals:   09/17/23 1222  BP: (!) 175/63  Pulse: 64  Resp: 18  Temp: (!) 97.4 F (36.3 C)  SpO2: 98%  Weight: 116 lb 6.4 oz (52.8 kg)  Height: 5\' 1"  (1.549 m)   Body mass index is 21.99 kg/m. Physical Exam Vitals reviewed.  Constitutional:      General: She is not in acute distress. HENT:     Head: Normocephalic.     Right Ear: There is no impacted cerumen.     Left Ear: There is impacted cerumen.     Nose: Nose normal.     Right Turbinates: Enlarged.     Left Turbinates: Enlarged.  Eyes:     General:        Right eye: No discharge.  Cardiovascular:     Rate and Rhythm: Normal rate and regular rhythm.     Pulses: Normal pulses.     Heart sounds: Normal heart sounds.  Pulmonary:     Effort: Pulmonary effort is normal.     Breath sounds: Normal breath sounds.  Abdominal:     General: There is distension.     Tenderness: There is no abdominal tenderness. There is no guarding.     Comments: RLQ and LLQ hypoactive bowel sounds  Musculoskeletal:     Cervical back: Neck supple.     Right lower leg: No edema.     Left lower leg: No edema.  Skin:    General: Skin is warm.     Capillary Refill: Capillary refill takes less than 2 seconds.  Neurological:     General: No focal deficit present.     Mental Status: She is alert and oriented to person, place, and time.     Motor: Weakness present.     Gait: Gait abnormal.  Psychiatric:        Mood and Affect: Mood normal.     Labs reviewed: Recent Labs    12/20/22 1015 12/27/22 1154 01/07/23 0000 04/02/23 0000 04/04/23 0000 06/13/23 0000 06/20/23 0000  NA 138 139   < > 142 142 131* 141  K 5.4* 4.9   < > 4.0 4.0 4.3 4.3  CL 103 101   < > 101 101 101 104  CO2 21 22   < > 28* 28* 24* 24*  GLUCOSE 87 98  --   --   --   --   --   BUN 34* 30*   < >  45* 45* 62* 57*  CREATININE 1.22* 1.19*   < > 1.3* 1.3*  1.4* 1.5*  CALCIUM 9.9 10.1   < > 9.1  --  9.1 9.4   < > = values in this interval not displayed.   Recent Labs    09/18/22 0000 06/13/23 0000  AST 31 30  ALT 27 21  ALKPHOS 3.9* 74  ALBUMIN 3.9 3.5   Recent Labs    04/02/23 0000 04/04/23 0000 06/20/23 0000  WBC 4.4 4.4 4.6  HGB 9.3* 9.3* 8.4*  HCT 27* 27* 24*  PLT 207 207 191   Lab Results  Component Value Date   TSH 4.325 04/25/2022   Lab Results  Component Value Date   HGBA1C 204 06/13/2023   Lab Results  Component Value Date   CHOL 208 (H) 01/28/2022   HDL 97 (A) 06/13/2023   LDLCALC 97 06/13/2023   TRIG 51 06/13/2023   CHOLHDL 2.6 01/28/2022    Significant Diagnostic Results in last 30 days:  No results found.  Assessment/Plan 1. Loose stools (Primary) - several episodes 04/07> resolved with imodium  - abdominal distension, lower quadrants hypoactive - concerns for constipation - start miralax BID x 2 days - if no improvement or symptoms worsen> recommend KUB/ stool pcr  2. Anemia due to stage 3a chronic kidney disease (HCC) - hgb 9.6 (01/23)> was 8.4 (01/09) - iron ferritin normal, TIBC low, B12 normal - cont ferrous sulfate - repeat cbc/diff  3. Seasonal allergies - swollen nasal turbinates, rhinorrhea, sneezing  - cont Flonase - Zyrtec 10 mg po at bedtime x 14 days   4. Impacted cerumen of right ear - start Debrox per [facility] protocol    Family/ staff Communication: plan discussed with patient and nurse  Labs/tests ordered:  cbc/diff, bmp 09/19/2023

## 2023-09-17 NOTE — Progress Notes (Signed)
 Subjective:     Patient ID: [name], female   DOB: 1935/06/12, 88 y.o.   MRN: [id number] Chief Complaint: Loose stools yesterday X 2  HPI  Patient reports two episodes of loose, runny bowel movements that began yesterday. She received one dose of Imodium administered by the RN, which resolved the diarrhea with no bowel movements after. Describes the stools as non-greasy, non-floating, non-mucousy, non-bloody, or foul-smelling. Reported "one small drop of blood noted after hemorrhoidal cream use", not associated with diarrhea. Reports recent consumption of salad prior to symptom onset. She states she normally has regular bowel movements with formed, brown stools, with the last normal BM occurring on Sunday. Feels generally fatigued and attributed it to decreased appetite and low oral intake. Tolerated soup earlier today and plans to eat more soup and crackers.    Reports sinus congestion beginning last week after walking outside during current pollen season, along with sneezing and runny nose, but no itchy watery eyes.   Flu and COVID-19 tests were negative this morning.    Review of Systems She denies fever, nausea, vomiting, or abdominal pain. Denies chest pain, palpitations, shortness of breath, urinary symptoms, or dysuria. No recent antibiotic use, or sick contacts reported.     Objective:   Physical Exam Constitutional:      Appearance: Normal appearance. She is normal weight.     Comments: Reported feeling fatigued due to decreased appetite and eating.  HENT:     Head: Normocephalic and atraumatic.     Right Ear: Tympanic membrane normal.     Left Ear: Tympanic membrane normal.     Ears:     Comments: Yellow cerumen on the right ear more than the left.     Nose: Rhinorrhea present.     Mouth/Throat:     Pharynx: Posterior oropharyngeal erythema present.  Eyes:     Extraocular Movements: Extraocular movements intact.     Conjunctiva/sclera: Conjunctivae normal.      Pupils: Pupils are equal, round, and reactive to light.  Cardiovascular:     Rate and Rhythm: Normal rate and regular rhythm.     Pulses: Normal pulses.     Heart sounds: Normal heart sounds.     Comments: Subcutaneous pacemaker on the left chest. Pulmonary:     Effort: Pulmonary effort is normal.     Breath sounds: Normal breath sounds.  Abdominal:     General: There is distension.     Palpations: Abdomen is soft. There is no mass.     Tenderness: There is no abdominal tenderness. There is no guarding or rebound.  Musculoskeletal:        General: Normal range of motion.     Cervical back: Normal range of motion and neck supple.  Skin:    General: Skin is warm and dry.  Neurological:     General: No focal deficit present.     Mental Status: She is alert and oriented to person, place, and time.  Psychiatric:        Mood and Affect: Mood normal.        Behavior: Behavior normal.        Thought Content: Thought content normal.        Judgment: Judgment normal.        Assessment:      Acute constipation with overflow diarrhea - ICD-10: K59.00 (Potential)  Elderly female with previously regular bowel habits, now presenting with new-onset reduced appetite, no BM since Sunday, and two episodes  of loose stool yesterday likely representing fecal seepage. Hyperactive bowel sounds LUQ and hypoactive bowel sounds all the other 3 quadrants and effectiveness of a single Imodium dose support this diagnosis. No signs of infection or systemic illness. No ongoing diarrhea, abdominal pain, fever, or other red flags.  2. Seasonal allergic rhinitis - ICD-10: J30.1 Symptoms include sneezing, nasal congestion, and rhinorrhea without itchy eyes, triggered after outdoor exposure during pollen season.  3. Decreased appetite and mild generalized fatigue - ICD-10: R63.5, R53.83 Likely related to acute illness, mild dehydration, and dietary insufficiency. Normal electrolytes Na 141, K 4.3, Cl 104 on  06/20/2023  4. Anemia, unspecified (D64.9) - Hgb 8.4 g/dL on 40/98/11 Likely contributing to fatigue and weakness. Etiology undetermined, no overt GI bleeding, occult GI loss, nutritional deficiency (iron, B12). Most likely anemia of chronic disease due to her baseline reduced renal function.  5. History of hemorrhoids with minor bleeding - ICD-10: I84.9 Tiny blood drop noted likely secondary to topical cream application and straining as per patient report. Not currently flaring or an issue of concern.     Plan:      GI Management: Start MiraLAX 17g in 4 oz water BID for 2 days to support bowel regularity following Imodium use. Maintain bland diet: BRAT + soup. Encourage increased oral fluid intake. Allergic Rhinitis: Start Cetirizine (Zyrtec) 10 mg QHS for 14 days for sinus symptoms. General Measures: Encourage light physical activity as tolerated (walking with walker). Monitor for any recurrence of loose stools, signs of dehydration, or changes in mental status. Ordered CBC with differential and BMP for next lab draw on 09/19/2023 per facility protocol.

## 2023-09-19 ENCOUNTER — Encounter: Payer: Self-pay | Admitting: Adult Health

## 2023-09-19 ENCOUNTER — Non-Acute Institutional Stay: Payer: Self-pay | Admitting: Adult Health

## 2023-09-19 DIAGNOSIS — Z950 Presence of cardiac pacemaker: Secondary | ICD-10-CM | POA: Diagnosis not present

## 2023-09-19 DIAGNOSIS — R5383 Other fatigue: Secondary | ICD-10-CM | POA: Diagnosis not present

## 2023-09-19 DIAGNOSIS — J90 Pleural effusion, not elsewhere classified: Secondary | ICD-10-CM | POA: Diagnosis not present

## 2023-09-19 DIAGNOSIS — I10 Essential (primary) hypertension: Secondary | ICD-10-CM | POA: Diagnosis not present

## 2023-09-19 DIAGNOSIS — I517 Cardiomegaly: Secondary | ICD-10-CM | POA: Diagnosis not present

## 2023-09-19 DIAGNOSIS — R051 Acute cough: Secondary | ICD-10-CM | POA: Diagnosis not present

## 2023-09-19 DIAGNOSIS — I509 Heart failure, unspecified: Secondary | ICD-10-CM | POA: Diagnosis not present

## 2023-09-19 DIAGNOSIS — D649 Anemia, unspecified: Secondary | ICD-10-CM | POA: Diagnosis not present

## 2023-09-19 MED ORDER — CETIRIZINE HCL 10 MG PO TABS: 10.0000 mg | ORAL_TABLET | Freq: Every day | ORAL | Status: DC

## 2023-09-19 NOTE — Progress Notes (Addendum)
 Location:  Medical illustrator of Service:  ALF (13) Provider:   Peggye Ley, ANP [facility] [phone]   Mahlon Gammon, MD  Patient Care Team: Mahlon Gammon, MD as PCP - General (Internal Medicine) [place], [name], MD as PCP - Cardiology (Cardiology) Van Clines, MD as Consulting Physician (Neurology)  Extended Emergency Contact Information Primary Emergency Contact: [name],[name] Mobile Phone: [phone] Relation: Daughter Secondary Emergency Contact: ELFRIEDA, ESPINO Mobile Phone: [phone] Relation: Son  Code Status:  DNR Goals of care: Advanced Directive information    08/13/2023   10:31 AM  Advanced Directives  Does Patient Have a Medical Advance Directive? Yes  Type of Advance Directive Living will;Out of facility DNR (pink MOST or yellow form)  Pre-existing out of facility DNR order (yellow form or pink MOST form) Yellow form placed in chart (order not valid for inpatient use)     Chief Complaint  Patient presents with   Acute Visit    Fever cough    HPI:  The patient is an 88 year old who presents with fever, fatigue, cough, and low appetite.  She has been experiencing fever, fatigue, cough, and low appetite since September 17, 2023. The cough is nonproductive. She attributes some symptoms to pollen exposure earlier in the week and started taking Zyrtec, which has alleviated nasal congestion and sneezing. COVID-19 and influenza tests were negative on two separate occasions.  On April 8, she was treated for acute constipation and experienced overflow diarrhea. She was prescribed MiraLAX and has had several bowel movements since then. She wishes to discontinue MiraLAX. No nausea, vomiting, or abdominal pain.  She has chronic fatigue, which she attributes to anemia, but notes it is worse today. Her last hemoglobin level was 8.8 on August 08, 2023. She has good iron stores and normal B12 levels and is on iron and B  complex supplementation.  Her blood pressure was elevated at 180/56 this morning before taking her medications. She is on Diovan, torsemide, hydralazine, and Norvasc for blood pressure management. Typically, her blood pressure decreases to the 130-140s systolic after medication.  PMH: aortic atherosclerosis, cerebral AV malformation, HTN, HLD, Heart block AV-s/p PPM 05/30, MVP, afib, epilepsy, encephalopathy, neuropathy, anemia, and unstable gait.  She is s/p lap chole 04/23/22 and had and post op seizure. S/p PPM placement on 05/30 for CHB  01/27/22 Diagnosed with Pericarditis with Pericardial Effusion, which was treated with colchicine.   Past Medical History:  Diagnosis Date   Abnormal cardiovascular stress test    Carotid arterial disease (HCC)    MODERATE RIGHT   Cerebral AV malformation    DDD (degenerative disc disease)    SEVERE WITH SPINAL STENOSIS   Hypercholesterolemia    Hypertension    MVP (mitral valve prolapse)    MILD   Numbness    LEFT FOOT AND ANKLE   Osteopenia    Presence of permanent cardiac pacemaker    Pseudo-gout    Past Surgical History:  Procedure Laterality Date   APPENDECTOMY     BREAST MASS EXCISION     CARPAL TUNNEL RELEASE     CHOLECYSTECTOMY N/A 04/23/2022   Procedure: LAPAROSCOPIC CHOLECYSTECTOMY;  Surgeon: Abigail Miyamoto, MD;  Location: [facility] ORS;  Service: General;  Laterality: N/A;   gallbladder     PACEMAKER IMPLANT N/A 11/07/2021   Procedure: PACEMAKER IMPLANT;  Surgeon: Marinus Maw, MD;  Location: [facility]ASIVE [facility];  Service: Cardiovascular;  Laterality: N/A;   REMOVAL  OF PARATHYROID GLAND     STRESS NUCLEAR STUDY     ABNORMAL   TONSILLECTOMY      Allergies  Allergen Reactions   Aleve [Naproxen] Other (See Comments)   Azithromycin Other (See Comments)    Reaction not recalled   Penicillins Hives and Other (See Comments)    Bad reaction as a child after a shot of Penicillin   Pravastatin Other (See Comments)    Reaction not  recalled   Sulfa Antibiotics Other (See Comments)   Sulfa Drugs Cross Reactors Other (See Comments)    Reaction not recalled- stated she can take it "in small doses"   Tape Itching and Other (See Comments)    Cannot wear for an extended period of time   Naproxen Sodium Rash    Outpatient Encounter Medications as of 09/19/2023  Medication Sig   cetirizine (ZYRTEC ALLERGY) 10 MG tablet Take 1 tablet (10 mg total) by mouth at bedtime for 14 days.   acetaminophen (TYLENOL) 500 MG tablet Take 500 mg by mouth every 6 (six) hours as needed for mild pain or headache.   amLODipine (NORVASC) 5 MG tablet Take 1 tablet (5 mg total) by mouth daily.   b complex vitamins capsule Take 1 capsule by mouth daily.   dextromethorphan-guaiFENesin (ROBITUSSIN-DM) 10-100 MG/5ML liquid Take 10 mLs by mouth every 6 (six) hours as needed for cough.   fluticasone (FLONASE) 50 MCG/ACT nasal spray Place into both nostrils daily.   hydrALAZINE (APRESOLINE) 10 MG tablet Take 10 mg by mouth daily as needed.   hydrALAZINE (APRESOLINE) 50 MG tablet Take 50 mg by mouth 4 (four) times daily.   ipratropium-albuterol (DUONEB) 0.5-2.5 (3) MG/3ML SOLN Take 3 mLs by nebulization every 6 (six) hours as needed.   Iron, Ferrous Sulfate, 325 (65 Fe) MG TABS Take 325 mg by mouth 3 (three) times a week.   loperamide (IMODIUM A-D) 2 MG tablet Take 2 mg by mouth 3 (three) times daily as needed for diarrhea or loose stools.   magnesium oxide (MAG-OX) 400 (240 Mg) MG tablet Take 400 mg by mouth 2 (two) times daily.   Multiple Vitamins-Iron (MULTI-VITAMIN/IRON PO) Take by mouth.   ondansetron (ZOFRAN-ODT) 4 MG disintegrating tablet Take 4 mg by mouth every 4 (four) hours as needed for nausea or vomiting.   PHENobarbital (LUMINAL) 64.8 MG tablet Take 1 tablet (64.8 mg total) by mouth daily. Take 1 tablet every morning   phenytoin (DILANTIN) 100 MG ER capsule Take 200 mg by mouth daily.   Polyethyl Glycol-Propyl Glycol (SYSTANE) 0.4-0.3 % SOLN  Place 1 drop into both eyes in the morning and at bedtime.   torsemide (DEMADEX) 20 MG tablet Take 1 tablet (20 mg total) by mouth 3 (three) times a week.   triamcinolone cream (KENALOG) 0.1 % Apply 1 Application topically 2 (two) times daily as needed.   valsartan (DIOVAN) 160 MG tablet Take 1 tablet (160 mg total) by mouth 2 (two) times daily.   [DISCONTINUED] polyethylene glycol powder (GLYCOLAX/MIRALAX) 17 GM/SCOOP powder Take 17 g by mouth 2 (two) times daily for 3 days.   No facility-administered encounter medications on file as of 09/19/2023.    Review of Systems  Constitutional:  Positive for activity change, appetite change and fatigue. Negative for chills, diaphoresis and fever.  HENT:  Positive for congestion. Negative for sinus pressure, sinus pain, sore throat and trouble swallowing.   Respiratory:  Positive for cough. Negative for shortness of breath and wheezing.   Cardiovascular:  Negative for chest pain and leg swelling.  Gastrointestinal:  Positive for diarrhea. Negative for abdominal distention, abdominal pain, constipation, nausea and vomiting.  Genitourinary:  Negative for difficulty urinating, dysuria and urgency.  Musculoskeletal:  Positive for arthralgias and gait problem. Negative for back pain, myalgias and neck pain.  Skin:  Negative for rash.  Neurological:  Negative for dizziness and weakness.  Psychiatric/Behavioral:  Negative for confusion.     Immunization History  Administered Date(s) Administered   Fluad Quad(high Dose 65+) 04/16/2022   Fluad Trivalent(High Dose 65+) 04/02/2023   Influenza Split 03/28/2009, 03/17/2010, 03/26/2011, 04/01/2012, 03/05/2013   Influenza, Quadrivalent, Recombinant, Inj, Pf 03/08/2018, 02/13/2019, 02/24/2020   Influenza,inj,Quad PF,6+ Mos 02/25/2014, 03/05/2015   Influenza-Unspecified 02/09/2014, 02/25/2016, 04/06/2017, 04/25/2021   Moderna Covid-19 Vaccine Bivalent Booster 36yrs & up 04/02/2023   Moderna Sars-Covid-2  Vaccination 07/17/2019, 08/14/2019, 02/24/2020, 04/26/2020   Pneumococcal Conjugate-13 11/26/2014   Pneumococcal Polysaccharide-23 06/23/2003, 11/10/2012   Pneumococcal-Unspecified 06/23/2003, 11/10/2012   Td (Adult), 2 Lf Tetanus Toxid, Preservative Free 06/11/2000, 11/06/2011   Td (Adult),5 Lf Tetanus Toxid, Preservative Free 06/11/2000, 11/06/2011   Unspecified SARS-COV-2 Vaccination 04/28/2021   Zoster Recombinant(Shingrix) 01/09/2019, 05/08/2019   Zoster, Live 01/15/2006, 01/15/2019   Pertinent  Health Maintenance Due  Topic Date Due   DEXA SCAN  Never done   INFLUENZA VACCINE  01/10/2024      04/08/2023    1:34 PM 06/18/2023    1:50 PM 07/23/2023    9:49 AM 08/05/2023    3:34 PM 08/13/2023   10:30 AM  Fall Risk  Falls in the past year? 0 0 0 0 0  Was there an injury with Fall? 0 0 0 0 0  Fall Risk Category Calculator 0 0 0 0 0  Patient at Risk for Falls Due to  No Fall Risks  No Fall Risks Impaired balance/gait;Impaired mobility  Fall risk Follow up Falls evaluation completed Falls evaluation completed Falls evaluation completed Falls evaluation completed Falls evaluation completed   Functional Status Survey:    Vitals:   09/19/23 1526 09/19/23 1611  BP: (!) 180/56 (!) 154/84  Pulse: 63   Resp: (!) 21   Temp: 100.1 F (37.8 C)   SpO2: 93%    There is no height or weight on file to calculate BMI. Physical Exam Vitals and nursing note reviewed.  Constitutional:      General: She is not in acute distress.    Appearance: She is not diaphoretic.  HENT:     Head: Normocephalic and atraumatic.     Right Ear: There is impacted cerumen.     Left Ear: There is impacted cerumen.     Nose: Congestion present.     Mouth/Throat:     Mouth: Mucous membranes are moist.     Pharynx: Oropharynx is clear.     Comments: Whit coating to tongue Eyes:     Conjunctiva/sclera: Conjunctivae normal.     Pupils: Pupils are equal, round, and reactive to light.  Neck:     Vascular: No  JVD.  Cardiovascular:     Rate and Rhythm: Normal rate and regular rhythm.     Heart sounds: Murmur heard.  Pulmonary:     Effort: Pulmonary effort is normal. No respiratory distress.     Breath sounds: Rales (left base) present. No wheezing.  Abdominal:     General: Bowel sounds are normal. There is no distension.     Palpations: Abdomen is soft.     Tenderness: There is no abdominal  tenderness.  Musculoskeletal:     Cervical back: No rigidity or tenderness.  Lymphadenopathy:     Cervical: No cervical adenopathy.  Skin:    General: Skin is warm and dry.  Neurological:     General: No focal deficit present.     Mental Status: She is alert. Mental status is at baseline.     Labs reviewed: Recent Labs    12/20/22 1015 12/27/22 1154 01/07/23 0000 04/02/23 0000 04/04/23 0000 06/13/23 0000 06/20/23 0000  NA 138 139   < > 142 142 131* 141  K 5.4* 4.9   < > 4.0 4.0 4.3 4.3  CL 103 101   < > 101 101 101 104  CO2 21 22   < > 28* 28* 24* 24*  GLUCOSE 87 98  --   --   --   --   --   BUN 34* 30*   < > 45* 45* 62* 57*  CREATININE 1.22* 1.19*   < > 1.3* 1.3* 1.4* 1.5*  CALCIUM 9.9 10.1   < > 9.1  --  9.1 9.4   < > = values in this interval not displayed.   Recent Labs    06/13/23 0000  AST 30  ALT 21  ALKPHOS 74  ALBUMIN 3.5   Recent Labs    04/02/23 0000 04/04/23 0000 06/20/23 0000  WBC 4.4 4.4 4.6  HGB 9.3* 9.3* 8.4*  HCT 27* 27* 24*  PLT 207 207 191   Lab Results  Component Value Date   TSH 4.325 04/25/2022   Lab Results  Component Value Date   HGBA1C 204 06/13/2023   Lab Results  Component Value Date   CHOL 208 (H) 01/28/2022   HDL 97 (A) 06/13/2023   LDLCALC 97 06/13/2023   TRIG 51 06/13/2023   CHOLHDL 2.6 01/28/2022    Significant Diagnostic Results in last 30 days:  No results found.  Assessment/Plan Viral Illness with Cough Nonproductive cough, fatigue, low appetite, low-grade fever. Negative COVID-19 and influenza tests. Rales suggest  possible pneumonia, but clinical presentation indicates viral illness. - Order 2-view chest X-ray to rule out pneumonia. - Administer PRN Duoneb and Robitussin for cough management.  Anemia Chronic fatigue likely due to anemia. Hemoglobin at 8.8 g/dL. Adequate iron stores and normal B12 levels. On iron and B complex supplementation. - Continue iron supplementation. - Consider follow-up with hematology if anemia does not improve.  Hypertension Blood pressure elevated at 180/56 mmHg pre-medication. On Diovan, Torsemide, Hydralazine, and Norvasc. Post-medication systolic BP 130-140 mmHg. - Recheck blood pressure after medication administration.  Constipation Acute constipation treated with MiraLAX. Reports several bowel movements, wishes to discontinue MiraLAX. - Discontinue MiraLAX as requested.  Total time :  time greater than 50% of total time spent doing pt counseling and coordination of care

## 2023-09-20 ENCOUNTER — Non-Acute Institutional Stay: Payer: Self-pay | Admitting: Orthopedic Surgery

## 2023-09-20 ENCOUNTER — Encounter: Payer: Self-pay | Admitting: Orthopedic Surgery

## 2023-09-20 ENCOUNTER — Telehealth: Payer: Self-pay

## 2023-09-20 DIAGNOSIS — R051 Acute cough: Secondary | ICD-10-CM

## 2023-09-20 DIAGNOSIS — R197 Diarrhea, unspecified: Secondary | ICD-10-CM

## 2023-09-20 DIAGNOSIS — R062 Wheezing: Secondary | ICD-10-CM | POA: Diagnosis not present

## 2023-09-20 DIAGNOSIS — I5032 Chronic diastolic (congestive) heart failure: Secondary | ICD-10-CM

## 2023-09-20 NOTE — Telephone Encounter (Signed)
 Paper received and placed in PCP Mahlon Gammon, MD mailbox to fill out her part and have mailed to patient so it can be faxed at clinic.

## 2023-09-20 NOTE — Telephone Encounter (Signed)
 Copied from CRM [id number]. Topic: Clinical - Prescription Issue >> Sep 20, 2023  9:55 AM Maree Krabbe H wrote: Reason for CRM: [facility]) called and needed more information on a prior auth for a rx for the patient, tried to call clinic got a answer and was transferred but no one answered, [facility] callback number is [phone], Reuel Boom also stated that he would be sending a fax over within the hour if someone could fill it out and fax it back that would be okay as well.

## 2023-09-20 NOTE — Progress Notes (Signed)
 Location:  [facility] Room Number: 626/A Place of Service:  SNF [phone]) Provider:  Arnetha Bhat, NP   Marguerite Shiley, MD  Patient Care Team: Marguerite Shiley, MD as PCP - General (Internal Medicine) [place], [name], MD as PCP - Cardiology (Cardiology) Jhonny Moss, MD as Consulting Physician (Neurology)  Extended Emergency Contact Information Primary Emergency Contact: [name],[name] Mobile Phone: [phone] Relation: Daughter Secondary Emergency Contact: [name], [name] Mobile Phone: [phone] Relation: Son  Code Status:  DNR Goals of care: Advanced Directive information    08/13/2023   10:31 AM  Advanced Directives  Does Patient Have a Medical Advance Directive? Yes  Type of Advance Directive Living will;Out of facility DNR (pink MOST or yellow form)  Pre-existing out of facility DNR order (yellow form or pink MOST form) Yellow form placed in chart (order not valid for inpatient use)     Chief Complaint  Patient presents with   Acute Visit    Fever, ongoing cough/congestion    HPI:  Pt is a 88 y.o. female seen today for acute visit due to ongoing cough.   She currently resides on the assisted living unit at [facility]. PMH: aortic atherosclerosis, HTN, MVP, PAF, CHB s/p pacemaker 05/30, neuropathy, encephalopathy, epilepsy, iron deficiency anemia and low back pain.   04/08 she reported rhinorrhea, sneezing and itchy eyes. She was prescribed Zyrtec x 14 days. Also on Flonase. 04/10 she began to have fever, fatigue and cough. Covid/flu tests negative. CXR, duonebs and robitussin prescribed. CXR revealed CHF and mild left effusion. This morning she had a fever of 100.1. She admits to fatigue and feeling like she has a chest cold. She also continues to have nasal congestion. Remains on torsemide for CHF. Daily weights reviewed, no substantial weight gain. No ankle edema.   Diarrhea has subsided after stopping miralax. She denies abdominal  discomfort and N/V. Admits to poor appetite.   Past Medical History:  Diagnosis Date   Abnormal cardiovascular stress test    Carotid arterial disease (HCC)    MODERATE RIGHT   Cerebral AV malformation    DDD (degenerative disc disease)    SEVERE WITH SPINAL STENOSIS   Hypercholesterolemia    Hypertension    MVP (mitral valve prolapse)    MILD   Numbness    LEFT FOOT AND ANKLE   Osteopenia    Presence of permanent cardiac pacemaker    Pseudo-gout    Past Surgical History:  Procedure Laterality Date   APPENDECTOMY     BREAST MASS EXCISION     CARPAL TUNNEL RELEASE     CHOLECYSTECTOMY N/A 04/23/2022   Procedure: LAPAROSCOPIC CHOLECYSTECTOMY;  Surgeon: Oza Blumenthal, MD;  Location: [facility];  Service: General;  Laterality: N/A;   gallbladder     PACEMAKER IMPLANT N/A 11/07/2021   Procedure: PACEMAKER IMPLANT;  Surgeon: Tammie Fall, MD;  Location: [facility];  Service: Cardiovascular;  Laterality: N/A;   REMOVAL OF PARATHYROID GLAND     STRESS NUCLEAR STUDY     ABNORMAL   TONSILLECTOMY      Allergies  Allergen Reactions   Aleve [Naproxen] Other (See Comments)   Azithromycin Other (See Comments)    Reaction not recalled   Penicillins Hives and Other (See Comments)    Bad reaction as a child after a shot of Penicillin   Pravastatin Other (See Comments)    Reaction not recalled   Sulfa Antibiotics Other (See Comments)   Sulfa Drugs  Cross Reactors Other (See Comments)    Reaction not recalled- stated she can take it "in small doses"   Tape Itching and Other (See Comments)    Cannot wear for an extended period of time   Naproxen Sodium Rash    Outpatient Encounter Medications as of 09/20/2023  Medication Sig   acetaminophen (TYLENOL) 500 MG tablet Take 500 mg by mouth every 6 (six) hours as needed for mild pain or headache.   amLODipine (NORVASC) 5 MG tablet Take 1 tablet (5 mg total) by mouth daily.   b complex vitamins capsule Take 1 capsule by mouth  daily.   cetirizine (ZYRTEC ALLERGY) 10 MG tablet Take 1 tablet (10 mg total) by mouth at bedtime for 14 days.   dextromethorphan-guaiFENesin (ROBITUSSIN-DM) 10-100 MG/5ML liquid Take 10 mLs by mouth every 6 (six) hours as needed for cough.   fluticasone (FLONASE) 50 MCG/ACT nasal spray Place into both nostrils daily.   hydrALAZINE (APRESOLINE) 10 MG tablet Take 10 mg by mouth daily as needed.   hydrALAZINE (APRESOLINE) 50 MG tablet Take 50 mg by mouth 4 (four) times daily.   ipratropium-albuterol (DUONEB) 0.5-2.5 (3) MG/3ML SOLN Take 3 mLs by nebulization every 6 (six) hours as needed.   Iron, Ferrous Sulfate, 325 (65 Fe) MG TABS Take 325 mg by mouth 3 (three) times a week.   loperamide (IMODIUM A-D) 2 MG tablet Take 2 mg by mouth 3 (three) times daily as needed for diarrhea or loose stools.   magnesium oxide (MAG-OX) 400 (240 Mg) MG tablet Take 400 mg by mouth 2 (two) times daily.   Multiple Vitamins-Iron (MULTI-VITAMIN/IRON PO) Take by mouth.   ondansetron (ZOFRAN-ODT) 4 MG disintegrating tablet Take 4 mg by mouth every 4 (four) hours as needed for nausea or vomiting.   PHENobarbital (LUMINAL) 64.8 MG tablet Take 1 tablet (64.8 mg total) by mouth daily. Take 1 tablet every morning   phenytoin (DILANTIN) 100 MG ER capsule Take 200 mg by mouth daily.   Polyethyl Glycol-Propyl Glycol (SYSTANE) 0.4-0.3 % SOLN Place 1 drop into both eyes in the morning and at bedtime.   torsemide (DEMADEX) 20 MG tablet Take 1 tablet (20 mg total) by mouth 3 (three) times a week.   triamcinolone cream (KENALOG) 0.1 % Apply 1 Application topically 2 (two) times daily as needed.   valsartan (DIOVAN) 160 MG tablet Take 1 tablet (160 mg total) by mouth 2 (two) times daily.   No facility-administered encounter medications on file as of 09/20/2023.    Review of Systems  Constitutional:  Positive for fatigue and fever.  HENT:  Positive for rhinorrhea and sinus pressure. Negative for ear pain, sinus pain, sore throat and  trouble swallowing.   Respiratory:  Positive for cough and wheezing. Negative for shortness of breath.   Cardiovascular:  Negative for chest pain and leg swelling.  Gastrointestinal:  Negative for abdominal distention, abdominal pain, diarrhea, nausea and vomiting.  Musculoskeletal:  Negative for myalgias.  Neurological:  Positive for weakness. Negative for dizziness and headaches.  Psychiatric/Behavioral:  Negative for confusion and dysphoric mood. The patient is not nervous/anxious.     Immunization History  Administered Date(s) Administered   Fluad Quad(high Dose 65+) 04/16/2022   Fluad Trivalent(High Dose 65+) 04/02/2023   Influenza Split 03/28/2009, 03/17/2010, 03/26/2011, 04/01/2012, 03/05/2013   Influenza, Quadrivalent, Recombinant, Inj, Pf 03/08/2018, 02/13/2019, 02/24/2020   Influenza,inj,Quad PF,6+ Mos 02/25/2014, 03/05/2015   Influenza-Unspecified 02/09/2014, 02/25/2016, 04/06/2017, 04/25/2021   Moderna Covid-19 Vaccine Bivalent Booster 35yrs & up  04/02/2023   Moderna Sars-Covid-2 Vaccination 07/17/2019, 08/14/2019, 02/24/2020, 04/26/2020   Pneumococcal Conjugate-13 11/26/2014   Pneumococcal Polysaccharide-23 06/23/2003, 11/10/2012   Pneumococcal-Unspecified 06/23/2003, 11/10/2012   Td (Adult), 2 Lf Tetanus Toxid, Preservative Free 06/11/2000, 11/06/2011   Td (Adult),5 Lf Tetanus Toxid, Preservative Free 06/11/2000, 11/06/2011   Unspecified SARS-COV-2 Vaccination 04/28/2021   Zoster Recombinant(Shingrix) 01/09/2019, 05/08/2019   Zoster, Live 01/15/2006, 01/15/2019   Pertinent  Health Maintenance Due  Topic Date Due   DEXA SCAN  Never done   INFLUENZA VACCINE  01/10/2024      04/08/2023    1:34 PM 06/18/2023    1:50 PM 07/23/2023    9:49 AM 08/05/2023    3:34 PM 08/13/2023   10:30 AM  Fall Risk  Falls in the past year? 0 0 0 0 0  Was there an injury with Fall? 0 0 0 0 0  Fall Risk Category Calculator 0 0 0 0 0  Patient at Risk for Falls Due to  No Fall Risks  No Fall  Risks Impaired balance/gait;Impaired mobility  Fall risk Follow up Falls evaluation completed Falls evaluation completed Falls evaluation completed Falls evaluation completed Falls evaluation completed   Functional Status Survey:    Vitals:   09/20/23 1643  BP: (!) 129/52  Pulse: 63  Resp: (!) 21  Temp: 100.1 F (37.8 C)  SpO2: 93%  Weight: 116 lb 6.4 oz (52.8 kg)  Height: 5\' 1"  (1.549 m)   Body mass index is 21.99 kg/m. Physical Exam Vitals reviewed.  Constitutional:      General: She is not in acute distress. HENT:     Head: Normocephalic.  Eyes:     General:        Right eye: No discharge.        Left eye: No discharge.  Cardiovascular:     Rate and Rhythm: Normal rate and regular rhythm.     Pulses: Normal pulses.     Heart sounds: Normal heart sounds.  Pulmonary:     Effort: Pulmonary effort is normal. No respiratory distress.     Breath sounds: Examination of the right-upper field reveals wheezing and rhonchi. Examination of the left-upper field reveals wheezing and rhonchi. Examination of the right-middle field reveals rhonchi. Examination of the left-middle field reveals decreased breath sounds and rhonchi. Examination of the left-lower field reveals decreased breath sounds. Decreased breath sounds, wheezing and rhonchi present. No rales.  Abdominal:     General: Bowel sounds are normal.     Palpations: Abdomen is soft.  Musculoskeletal:     Cervical back: Neck supple.     Right lower leg: No edema.     Left lower leg: No edema.  Skin:    General: Skin is warm.     Capillary Refill: Capillary refill takes less than 2 seconds.  Neurological:     General: No focal deficit present.     Mental Status: She is alert and oriented to person, place, and time.  Psychiatric:        Mood and Affect: Mood normal.     Labs reviewed: Recent Labs    12/20/22 1015 12/27/22 1154 01/07/23 0000 04/02/23 0000 04/04/23 0000 06/13/23 0000 06/20/23 0000  NA 138 139   <  > 142 142 131* 141  K 5.4* 4.9   < > 4.0 4.0 4.3 4.3  CL 103 101   < > 101 101 101 104  CO2 21 22   < > 28* 28* 24* 24*  GLUCOSE  87 98  --   --   --   --   --   BUN 34* 30*   < > 45* 45* 62* 57*  CREATININE 1.22* 1.19*   < > 1.3* 1.3* 1.4* 1.5*  CALCIUM 9.9 10.1   < > 9.1  --  9.1 9.4   < > = values in this interval not displayed.   Recent Labs    06/13/23 0000  AST 30  ALT 21  ALKPHOS 74  ALBUMIN 3.5   Recent Labs    04/02/23 0000 04/04/23 0000 06/20/23 0000  WBC 4.4 4.4 4.6  HGB 9.3* 9.3* 8.4*  HCT 27* 27* 24*  PLT 207 207 191   Lab Results  Component Value Date   TSH 4.325 04/25/2022   Lab Results  Component Value Date   HGBA1C 204 06/13/2023   Lab Results  Component Value Date   CHOL 208 (H) 01/28/2022   HDL 97 (A) 06/13/2023   LDLCALC 97 06/13/2023   TRIG 51 06/13/2023   CHOLHDL 2.6 01/28/2022    Significant Diagnostic Results in last 30 days:  No results found.  Assessment/Plan 1. Acute cough (Primary) - ongoing since 04/08 - increased fatigue, fever 100.1, nasal congestion, cough> sometimes productive - CXR> CHF and mild left effusion - no recent weight gain/edema - diffuse wheezing and rhonchi noted to upper lobes, left side mildly diminished - differentials> URI, CHF, PNA - will start doxy for suspected PNA/URI - if no improvement recommend bmp and bnp to r/o CHF exacerbation - cont torsemide - doxycycline (VIBRA-TABS) 100 MG tablet; Take 1 tablet (100 mg total) by mouth 2 (two) times daily for 7 days.  2. Wheezing - see above - cont duonebs - start prednisone to improve wheezing and recent poor appetite - predniSONE (DELTASONE) 20 MG tablet; Take 2 tablets (40 mg total) by mouth daily with breakfast for 5 days, THEN 1 tablet (20 mg total) daily with breakfast for 2 days.  3. Diarrhea, unspecified type - no episodes x 1 day - increased miralax stopped - bowel sounds normal  4. Chronic diastolic congestive heart failure (HCC) - weights  stable, no ankle edema - see above - cont torsemide  Family/ staff Communication: plan discussed with patient and nursing  Labs/tests ordered:  none

## 2023-09-21 MED ORDER — PREDNISONE 20 MG PO TABS: ORAL_TABLET | ORAL | Status: DC

## 2023-09-21 MED ORDER — DOXYCYCLINE HYCLATE 100 MG PO TABS: 100.0000 mg | ORAL_TABLET | Freq: Two times a day (BID) | ORAL | Status: DC

## 2023-09-24 ENCOUNTER — Non-Acute Institutional Stay: Payer: Self-pay | Admitting: Orthopedic Surgery

## 2023-09-24 ENCOUNTER — Encounter: Payer: Self-pay | Admitting: Orthopedic Surgery

## 2023-09-24 DIAGNOSIS — I5033 Acute on chronic diastolic (congestive) heart failure: Secondary | ICD-10-CM | POA: Diagnosis not present

## 2023-09-24 DIAGNOSIS — N179 Acute kidney failure, unspecified: Secondary | ICD-10-CM | POA: Diagnosis not present

## 2023-09-24 DIAGNOSIS — E871 Hypo-osmolality and hyponatremia: Secondary | ICD-10-CM | POA: Diagnosis not present

## 2023-09-24 DIAGNOSIS — R051 Acute cough: Secondary | ICD-10-CM

## 2023-09-24 DIAGNOSIS — E860 Dehydration: Secondary | ICD-10-CM | POA: Diagnosis not present

## 2023-09-24 DIAGNOSIS — I5032 Chronic diastolic (congestive) heart failure: Secondary | ICD-10-CM | POA: Diagnosis not present

## 2023-09-24 NOTE — Progress Notes (Unsigned)
 Location:  [facility] Room Number: 626/A Place of Service:  ALF [phone]) Provider:  Octavia Heir, NP   Mahlon Gammon, MD  Patient Care Team: Mahlon Gammon, MD as PCP - General (Internal Medicine) [place], [name], MD as PCP - Cardiology (Cardiology) Van Clines, MD as Consulting Physician (Neurology)  Extended Emergency Contact Information Primary Emergency Contact: [name],[name] Mobile Phone: [phone] Relation: Daughter Secondary Emergency Contact: LYRICK, WORLAND Mobile Phone: [phone] Relation: Son  Code Status:  DNR Goals of care: Advanced Directive information    08/13/2023   10:31 AM  Advanced Directives  Does Patient Have a Medical Advance Directive? Yes  Type of Advance Directive Living will;Out of facility DNR (pink MOST or yellow form)  Pre-existing out of facility DNR order (yellow form or pink MOST form) Yellow form placed in chart (order not valid for inpatient use)     Chief Complaint  Patient presents with   Acute Visit    HPI:  Pt is a 88 y.o. female seen today for acute visit due to acute cough.   She currently resides on the assisted living unit at [facility]. PMH: aortic atherosclerosis, HTN, MVP, PAF, CHB s/p pacemaker 05/30, neuropathy, encephalopathy, epilepsy, iron deficiency anemia and low back pain.   "04/08 she reported rhinorrhea, sneezing and itchy eyes. She was prescribed Zyrtec x 14 days. Also on Flonase. 04/10 she began to have fever, fatigue and cough. Covid/flu tests negative. CXR, duonebs and robitussin prescribed. CXR revealed CHF and mild left effusion> unchanged from prior images. This morning she had a fever of 100.1. She admits to fatigue and feeling like she has a chest cold. She also continues to have nasal congestion. Remains on torsemide for CHF. Daily weights reviewed, no substantial weight gain. No ankle edema."   Today she reports worsening cough and fatigue. Cough worse with exertion.  Fever and congestion have subsided. Remains on doxycycline and prednisone taper until 04/17.    Past Surgical History:  Procedure Laterality Date   APPENDECTOMY     BREAST MASS EXCISION     CARPAL TUNNEL RELEASE     CHOLECYSTECTOMY N/A 04/23/2022   Procedure: LAPAROSCOPIC CHOLECYSTECTOMY;  Surgeon: Abigail Miyamoto, MD;  Location: [facility];  Service: General;  Laterality: N/A;   gallbladder     PACEMAKER IMPLANT N/A 11/07/2021   Procedure: PACEMAKER IMPLANT;  Surgeon: Marinus Maw, MD;  Location: [facility];  Service: Cardiovascular;  Laterality: N/A;   REMOVAL OF PARATHYROID GLAND     STRESS NUCLEAR STUDY     ABNORMAL   TONSILLECTOMY      Allergies  Allergen Reactions   Aleve [Naproxen] Other (See Comments)   Azithromycin Other (See Comments)    Reaction not recalled   Penicillins Hives and Other (See Comments)    Bad reaction as a child after a shot of Penicillin   Pravastatin Other (See Comments)    Reaction not recalled   Sulfa Antibiotics Other (See Comments)   Sulfa Drugs Cross Reactors Other (See Comments)    Reaction not recalled- stated she can take it "in small doses"   Tape Itching and Other (See Comments)    Cannot wear for an extended period of time   Naproxen Sodium Rash    Outpatient Encounter Medications as of 09/24/2023  Medication Sig   acetaminophen (TYLENOL) 500 MG tablet Take 500 mg by mouth every 6 (six) hours as needed for mild pain or headache.   amLODipine (NORVASC)  5 MG tablet Take 1 tablet (5 mg total) by mouth daily.   b complex vitamins capsule Take 1 capsule by mouth daily.   cetirizine (ZYRTEC ALLERGY) 10 MG tablet Take 1 tablet (10 mg total) by mouth at bedtime for 14 days.   dextromethorphan-guaiFENesin (ROBITUSSIN-DM) 10-100 MG/5ML liquid Take 10 mLs by mouth every 6 (six) hours as needed for cough.   doxycycline (VIBRA-TABS) 100 MG tablet Take 1 tablet (100 mg total) by mouth 2 (two) times daily for 7 days.   fluticasone  (FLONASE) 50 MCG/ACT nasal spray Place into both nostrils daily.   hydrALAZINE (APRESOLINE) 10 MG tablet Take 10 mg by mouth daily as needed.   hydrALAZINE (APRESOLINE) 50 MG tablet Take 50 mg by mouth 4 (four) times daily.   ipratropium-albuterol (DUONEB) 0.5-2.5 (3) MG/3ML SOLN Take 3 mLs by nebulization every 6 (six) hours as needed.   Iron, Ferrous Sulfate, 325 (65 Fe) MG TABS Take 325 mg by mouth 3 (three) times a week.   loperamide (IMODIUM A-D) 2 MG tablet Take 2 mg by mouth 3 (three) times daily as needed for diarrhea or loose stools.   magnesium oxide (MAG-OX) 400 (240 Mg) MG tablet Take 400 mg by mouth 2 (two) times daily.   Multiple Vitamins-Iron (MULTI-VITAMIN/IRON PO) Take by mouth.   ondansetron (ZOFRAN-ODT) 4 MG disintegrating tablet Take 4 mg by mouth every 4 (four) hours as needed for nausea or vomiting.   PHENobarbital (LUMINAL) 64.8 MG tablet Take 1 tablet (64.8 mg total) by mouth daily. Take 1 tablet every morning   phenytoin (DILANTIN) 100 MG ER capsule Take 200 mg by mouth daily.   Polyethyl Glycol-Propyl Glycol (SYSTANE) 0.4-0.3 % SOLN Place 1 drop into both eyes in the morning and at bedtime.   predniSONE (DELTASONE) 20 MG tablet Take 2 tablets (40 mg total) by mouth daily with breakfast for 5 days, THEN 1 tablet (20 mg total) daily with breakfast for 2 days.   torsemide (DEMADEX) 20 MG tablet Take 1 tablet (20 mg total) by mouth 3 (three) times a week.   triamcinolone cream (KENALOG) 0.1 % Apply 1 Application topically 2 (two) times daily as needed.   valsartan (DIOVAN) 160 MG tablet Take 1 tablet (160 mg total) by mouth 2 (two) times daily.   No facility-administered encounter medications on file as of 09/24/2023.    Review of Systems  Constitutional:  Positive for fatigue. Negative for fever.  HENT:  Negative for rhinorrhea and trouble swallowing.   Respiratory:  Positive for cough, shortness of breath and wheezing.   Cardiovascular:  Negative for chest pain and leg  swelling.  Gastrointestinal:  Negative for abdominal distention and abdominal pain.  Genitourinary:  Negative for dysuria and frequency.  Musculoskeletal:  Positive for gait problem.  Skin:  Negative for wound.  Neurological:  Positive for weakness. Negative for dizziness and headaches.  Psychiatric/Behavioral:  Negative for confusion and dysphoric mood. The patient is not nervous/anxious.     Immunization History  Administered Date(s) Administered   Fluad Quad(high Dose 65+) 04/16/2022   Fluad Trivalent(High Dose 65+) 04/02/2023   Influenza Split 03/28/2009, 03/17/2010, 03/26/2011, 04/01/2012, 03/05/2013   Influenza, Quadrivalent, Recombinant, Inj, Pf 03/08/2018, 02/13/2019, 02/24/2020   Influenza,inj,Quad PF,6+ Mos 02/25/2014, 03/05/2015   Influenza-Unspecified 02/09/2014, 02/25/2016, 04/06/2017, 04/25/2021   Moderna Covid-19 Vaccine Bivalent Booster 41yrs & up 04/02/2023   Moderna Sars-Covid-2 Vaccination 07/17/2019, 08/14/2019, 02/24/2020, 04/26/2020   Pneumococcal Conjugate-13 11/26/2014   Pneumococcal Polysaccharide-23 06/23/2003, 11/10/2012   Pneumococcal-Unspecified 06/23/2003, 11/10/2012  Td (Adult), 2 Lf Tetanus Toxid, Preservative Free 06/11/2000, 11/06/2011   Td (Adult),5 Lf Tetanus Toxid, Preservative Free 06/11/2000, 11/06/2011   Unspecified SARS-COV-2 Vaccination 04/28/2021   Zoster Recombinant(Shingrix) 01/09/2019, 05/08/2019   Zoster, Live 01/15/2006, 01/15/2019   Pertinent  Health Maintenance Due  Topic Date Due   DEXA SCAN  Never done   INFLUENZA VACCINE  01/10/2024      04/08/2023    1:34 PM 06/18/2023    1:50 PM 07/23/2023    9:49 AM 08/05/2023    3:34 PM 08/13/2023   10:30 AM  Fall Risk  Falls in the past year? 0 0 0 0 0  Was there an injury with Fall? 0 0 0 0 0  Fall Risk Category Calculator 0 0 0 0 0  Patient at Risk for Falls Due to  No Fall Risks  No Fall Risks Impaired balance/gait;Impaired mobility  Fall risk Follow up Falls evaluation completed  Falls evaluation completed Falls evaluation completed Falls evaluation completed Falls evaluation completed   Functional Status Survey:    Vitals:   09/24/23 1227  BP: 129/76  Pulse: 60  Resp: 19  Temp: 98.4 F (36.9 C)  SpO2: 94%  Weight: 116 lb 6.4 oz (52.8 kg)  Height: 5\' 5"  (1.651 m)   Body mass index is 19.37 kg/m. Physical Exam Vitals reviewed.  Constitutional:      General: She is not in acute distress. HENT:     Head: Normocephalic.  Eyes:     General:        Right eye: No discharge.        Left eye: No discharge.  Cardiovascular:     Rate and Rhythm: Normal rate and regular rhythm.     Pulses: Normal pulses.     Heart sounds: Normal heart sounds.  Pulmonary:     Effort: Pulmonary effort is normal. No respiratory distress.     Breath sounds: Examination of the right-upper field reveals rales. Examination of the right-middle field reveals rales. Examination of the left-middle field reveals decreased breath sounds. Examination of the right-lower field reveals rales. Examination of the left-lower field reveals rales. Decreased breath sounds and rales present. No wheezing or rhonchi.  Abdominal:     General: Bowel sounds are normal. There is no distension.     Palpations: Abdomen is soft.     Tenderness: There is no abdominal tenderness.  Musculoskeletal:     Cervical back: Neck supple.     Right lower leg: Edema present.     Left lower leg: Edema present.     Comments: Non pitting  Skin:    General: Skin is warm.     Capillary Refill: Capillary refill takes less than 2 seconds.  Neurological:     General: No focal deficit present.     Mental Status: She is alert and oriented to person, place, and time.     Motor: Weakness present.     Gait: Gait abnormal.  Psychiatric:        Mood and Affect: Mood normal.     Labs reviewed: Recent Labs    12/20/22 1015 12/27/22 1154 01/07/23 0000 04/02/23 0000 04/04/23 0000 06/13/23 0000 06/20/23 0000  NA 138  139   < > 142 142 131* 141  K 5.4* 4.9   < > 4.0 4.0 4.3 4.3  CL 103 101   < > 101 101 101 104  CO2 21 22   < > 28* 28* 24* 24*  GLUCOSE 87 98  --   --   --   --   --  BUN 34* 30*   < > 45* 45* 62* 57*  CREATININE 1.22* 1.19*   < > 1.3* 1.3* 1.4* 1.5*  CALCIUM 9.9 10.1   < > 9.1  --  9.1 9.4   < > = values in this interval not displayed.   Recent Labs    06/13/23 0000  AST 30  ALT 21  ALKPHOS 74  ALBUMIN 3.5   Recent Labs    04/02/23 0000 04/04/23 0000 06/20/23 0000  WBC 4.4 4.4 4.6  HGB 9.3* 9.3* 8.4*  HCT 27* 27* 24*  PLT 207 207 191   Lab Results  Component Value Date   TSH 4.325 04/25/2022   Lab Results  Component Value Date   HGBA1C 204 06/13/2023   Lab Results  Component Value Date   CHOL 208 (H) 01/28/2022   HDL 97 (A) 06/13/2023   LDLCALC 97 06/13/2023   TRIG 51 06/13/2023   CHOLHDL 2.6 01/28/2022    Significant Diagnostic Results in last 30 days:  No results found.  Assessment/Plan 1. Acute cough (Primary) - ongoing - 04/11 fever 100.1, nasal congestion> doxycycline started - CXR noted CHF and left pleural effusion> unchanged from past images - rhonchi and wheezing improved, profuse rales on exam today - stat BNP and BMP> BNP 5393, BUN/creat 106/2.40, Na+ 134> received torsemide 40 mg dose> other doses held per on call provider  - give extra torsemide 40 mg po QAM x 2 days - cont torsemide 20 mg M/W/F  2. Acute on chronic diastolic (congestive) heart failure (HCC) - see above - send to ED for evaluation  3. Acute renal failure, unspecified acute renal failure type (HCC) - see above - BUN/creat 106/2.40 09/24/2023   Family/ staff Communication: plan discussed with patient and nursing   Labs/tests ordered:  ED evaluation

## 2023-09-25 ENCOUNTER — Other Ambulatory Visit: Payer: Self-pay

## 2023-09-25 ENCOUNTER — Emergency Department (HOSPITAL_COMMUNITY)

## 2023-09-25 ENCOUNTER — Inpatient Hospital Stay (HOSPITAL_COMMUNITY)
Admission: EM | Admit: 2023-09-25 | Discharge: 2023-09-27 | DRG: 682 | Disposition: A | Discharge status: Skilled Nursing Facility | Attending: Internal Medicine | Admitting: Internal Medicine

## 2023-09-25 ENCOUNTER — Observation Stay (HOSPITAL_COMMUNITY)

## 2023-09-25 ENCOUNTER — Encounter (HOSPITAL_COMMUNITY): Payer: Self-pay | Admitting: Emergency Medicine

## 2023-09-25 DIAGNOSIS — I10 Essential (primary) hypertension: Secondary | ICD-10-CM

## 2023-09-25 DIAGNOSIS — R059 Cough, unspecified: Secondary | ICD-10-CM | POA: Diagnosis not present

## 2023-09-25 DIAGNOSIS — I110 Hypertensive heart disease with heart failure: Secondary | ICD-10-CM | POA: Diagnosis not present

## 2023-09-25 DIAGNOSIS — Z87891 Personal history of nicotine dependence: Secondary | ICD-10-CM | POA: Diagnosis not present

## 2023-09-25 DIAGNOSIS — I442 Atrioventricular block, complete: Secondary | ICD-10-CM | POA: Diagnosis not present

## 2023-09-25 DIAGNOSIS — E7800 Pure hypercholesterolemia, unspecified: Secondary | ICD-10-CM | POA: Diagnosis present

## 2023-09-25 DIAGNOSIS — I5032 Chronic diastolic (congestive) heart failure: Secondary | ICD-10-CM | POA: Diagnosis not present

## 2023-09-25 DIAGNOSIS — N2889 Other specified disorders of kidney and ureter: Secondary | ICD-10-CM | POA: Diagnosis present

## 2023-09-25 DIAGNOSIS — Z950 Presence of cardiac pacemaker: Secondary | ICD-10-CM | POA: Diagnosis not present

## 2023-09-25 DIAGNOSIS — I2489 Other forms of acute ischemic heart disease: Secondary | ICD-10-CM | POA: Diagnosis present

## 2023-09-25 DIAGNOSIS — I2510 Atherosclerotic heart disease of native coronary artery without angina pectoris: Secondary | ICD-10-CM | POA: Diagnosis present

## 2023-09-25 DIAGNOSIS — R799 Abnormal finding of blood chemistry, unspecified: Secondary | ICD-10-CM

## 2023-09-25 DIAGNOSIS — I341 Nonrheumatic mitral (valve) prolapse: Secondary | ICD-10-CM | POA: Diagnosis present

## 2023-09-25 DIAGNOSIS — Z681 Body mass index (BMI) 19 or less, adult: Secondary | ICD-10-CM

## 2023-09-25 DIAGNOSIS — R531 Weakness: Secondary | ICD-10-CM | POA: Diagnosis not present

## 2023-09-25 DIAGNOSIS — J189 Pneumonia, unspecified organism: Secondary | ICD-10-CM | POA: Clinically undetermined

## 2023-09-25 DIAGNOSIS — R918 Other nonspecific abnormal finding of lung field: Secondary | ICD-10-CM | POA: Diagnosis not present

## 2023-09-25 DIAGNOSIS — I480 Paroxysmal atrial fibrillation: Secondary | ICD-10-CM | POA: Diagnosis present

## 2023-09-25 DIAGNOSIS — R636 Underweight: Secondary | ICD-10-CM | POA: Diagnosis present

## 2023-09-25 DIAGNOSIS — Z7401 Bed confinement status: Secondary | ICD-10-CM | POA: Diagnosis not present

## 2023-09-25 DIAGNOSIS — Z882 Allergy status to sulfonamides status: Secondary | ICD-10-CM | POA: Diagnosis not present

## 2023-09-25 DIAGNOSIS — G40909 Epilepsy, unspecified, not intractable, without status epilepticus: Secondary | ICD-10-CM | POA: Diagnosis present

## 2023-09-25 DIAGNOSIS — Z66 Do not resuscitate: Secondary | ICD-10-CM | POA: Diagnosis not present

## 2023-09-25 DIAGNOSIS — Z881 Allergy status to other antibiotic agents status: Secondary | ICD-10-CM

## 2023-09-25 DIAGNOSIS — Z801 Family history of malignant neoplasm of trachea, bronchus and lung: Secondary | ICD-10-CM | POA: Diagnosis not present

## 2023-09-25 DIAGNOSIS — N179 Acute kidney failure, unspecified: Secondary | ICD-10-CM | POA: Diagnosis present

## 2023-09-25 DIAGNOSIS — I447 Left bundle-branch block, unspecified: Secondary | ICD-10-CM | POA: Diagnosis not present

## 2023-09-25 DIAGNOSIS — Z823 Family history of stroke: Secondary | ICD-10-CM | POA: Diagnosis not present

## 2023-09-25 DIAGNOSIS — D649 Anemia, unspecified: Secondary | ICD-10-CM

## 2023-09-25 DIAGNOSIS — R42 Dizziness and giddiness: Secondary | ICD-10-CM | POA: Diagnosis not present

## 2023-09-25 DIAGNOSIS — D509 Iron deficiency anemia, unspecified: Secondary | ICD-10-CM | POA: Diagnosis present

## 2023-09-25 DIAGNOSIS — I4430 Unspecified atrioventricular block: Secondary | ICD-10-CM | POA: Diagnosis not present

## 2023-09-25 DIAGNOSIS — L89151 Pressure ulcer of sacral region, stage 1: Secondary | ICD-10-CM | POA: Diagnosis present

## 2023-09-25 DIAGNOSIS — Z880 Allergy status to penicillin: Secondary | ICD-10-CM

## 2023-09-25 DIAGNOSIS — Z886 Allergy status to analgesic agent status: Secondary | ICD-10-CM | POA: Diagnosis not present

## 2023-09-25 DIAGNOSIS — Z79899 Other long term (current) drug therapy: Secondary | ICD-10-CM | POA: Diagnosis not present

## 2023-09-25 DIAGNOSIS — Z888 Allergy status to other drugs, medicaments and biological substances status: Secondary | ICD-10-CM

## 2023-09-25 DIAGNOSIS — N3000 Acute cystitis without hematuria: Secondary | ICD-10-CM

## 2023-09-25 DIAGNOSIS — N390 Urinary tract infection, site not specified: Secondary | ICD-10-CM | POA: Diagnosis not present

## 2023-09-25 LAB — CBG MONITORING, ED: Glucose-Capillary: 109 mg/dL — ABNORMAL HIGH (ref 70–99)

## 2023-09-25 LAB — URINALYSIS, ROUTINE W REFLEX MICROSCOPIC
Bilirubin Urine: NEGATIVE
Glucose, UA: NEGATIVE mg/dL
Ketones, ur: NEGATIVE mg/dL
Nitrite: NEGATIVE
Protein, ur: 30 mg/dL — AB
Specific Gravity, Urine: 1.011 (ref 1.005–1.030)
WBC, UA: >50 WBC/hpf (ref 0–5)
pH: 5 (ref 5.0–8.0)

## 2023-09-25 LAB — BASIC METABOLIC PANEL WITH GFR
Anion gap: 14 (ref 5–15)
BUN: 119 mg/dL — ABNORMAL HIGH (ref 8–23)
CO2: 22 mmol/L (ref 22–32)
Calcium: 8.9 mg/dL (ref 8.9–10.3)
Chloride: 98 mmol/L (ref 98–111)
Creatinine, Ser: 2.14 mg/dL — ABNORMAL HIGH (ref 0.44–1.00)
GFR, Estimated: 22 mL/min — ABNORMAL LOW (ref >60)
Glucose, Bld: 114 mg/dL — ABNORMAL HIGH (ref 70–99)
Potassium: 3.8 mmol/L (ref 3.5–5.1)
Sodium: 134 mmol/L — ABNORMAL LOW (ref 135–145)

## 2023-09-25 LAB — CBC
HCT: 32.6 % — ABNORMAL LOW (ref 36.0–46.0)
Hemoglobin: 11 g/dL — ABNORMAL LOW (ref 12.0–15.0)
MCH: 32.9 pg (ref 26.0–34.0)
MCHC: 33.7 g/dL (ref 30.0–36.0)
MCV: 97.6 fL (ref 80.0–100.0)
Platelets: 262 10*3/uL (ref 150–400)
RBC: 3.34 MIL/uL — ABNORMAL LOW (ref 3.87–5.11)
RDW: 12.5 % (ref 11.5–15.5)
WBC: 10.2 10*3/uL (ref 4.0–10.5)
nRBC: 0 % (ref 0.0–0.2)

## 2023-09-25 LAB — TROPONIN I (HIGH SENSITIVITY): Troponin I (High Sensitivity): 56 ng/L — ABNORMAL HIGH (ref <18)

## 2023-09-25 LAB — BRAIN NATRIURETIC PEPTIDE: B Natriuretic Peptide: 104.7 pg/mL — ABNORMAL HIGH (ref 0.0–100.0)

## 2023-09-25 MED ORDER — SODIUM CHLORIDE 0.9 % IV SOLN
1.0000 g | INTRAVENOUS | Status: DC
  Administered 2023-09-25 – 2023-09-26 (×2): 1 g via INTRAVENOUS
  Filled 2023-09-25 (×2): qty 10

## 2023-09-25 MED ORDER — SODIUM CHLORIDE 0.9 % IV SOLN: Freq: Once | INTRAVENOUS | Status: AC

## 2023-09-25 MED ORDER — ENOXAPARIN SODIUM 30 MG/0.3ML IJ SOSY
30.0000 mg | PREFILLED_SYRINGE | INTRAMUSCULAR | Status: DC
  Administered 2023-09-25 – 2023-09-26 (×2): 30 mg via SUBCUTANEOUS
  Filled 2023-09-25 (×2): qty 0.3

## 2023-09-25 MED ORDER — IPRATROPIUM-ALBUTEROL 0.5-2.5 (3) MG/3ML IN SOLN: 3.0000 mL | Freq: Four times a day (QID) | RESPIRATORY_TRACT | Status: DC | PRN

## 2023-09-25 MED ORDER — AMLODIPINE BESYLATE 5 MG PO TABS
5.0000 mg | ORAL_TABLET | Freq: Every day | ORAL | Status: DC
  Administered 2023-09-26 – 2023-09-27 (×2): 5 mg via ORAL
  Filled 2023-09-25 (×2): qty 1

## 2023-09-25 MED ORDER — GUAIFENESIN-DM 100-10 MG/5ML PO SYRP: 10.0000 mL | ORAL_SOLUTION | Freq: Four times a day (QID) | ORAL | Status: DC | PRN

## 2023-09-25 MED ORDER — PHENYTOIN SODIUM EXTENDED 100 MG PO CAPS
200.0000 mg | ORAL_CAPSULE | Freq: Every day | ORAL | Status: DC
  Administered 2023-09-26 – 2023-09-27 (×2): 200 mg via ORAL
  Filled 2023-09-25 (×2): qty 2

## 2023-09-25 MED ORDER — ONDANSETRON 4 MG PO TBDP: 4.0000 mg | ORAL_TABLET | ORAL | Status: DC | PRN

## 2023-09-25 MED ORDER — LORATADINE 10 MG PO TABS
10.0000 mg | ORAL_TABLET | Freq: Every day | ORAL | Status: DC
  Administered 2023-09-26 – 2023-09-27 (×2): 10 mg via ORAL
  Filled 2023-09-25 (×2): qty 1

## 2023-09-25 MED ORDER — SODIUM CHLORIDE 0.9 % IV SOLN: INTRAVENOUS | Status: DC

## 2023-09-25 MED ORDER — PHENOBARBITAL 32.4 MG PO TABS
64.8000 mg | ORAL_TABLET | Freq: Every day | ORAL | Status: DC
  Administered 2023-09-26 – 2023-09-27 (×2): 64.8 mg via ORAL
  Filled 2023-09-25 (×2): qty 2

## 2023-09-25 MED ORDER — FLUTICASONE PROPIONATE 50 MCG/ACT NA SUSP
2.0000 | Freq: Every day | NASAL | Status: DC
  Administered 2023-09-26 – 2023-09-27 (×2): 2 via NASAL
  Filled 2023-09-25: qty 16

## 2023-09-25 MED ORDER — SODIUM CHLORIDE 0.9 % IV BOLUS
500.0000 mL | Freq: Once | INTRAVENOUS | Status: AC
  Administered 2023-09-25: 500 mL via INTRAVENOUS

## 2023-09-25 MED ORDER — ACETAMINOPHEN 500 MG PO TABS: 500.0000 mg | ORAL_TABLET | Freq: Four times a day (QID) | ORAL | Status: DC | PRN

## 2023-09-25 NOTE — ED Provider Notes (Signed)
 [place] [facility] AT [facility] Provider Note   CSN: [id number] Arrival date & time: 09/25/23  1144     History  Chief Complaint  Patient presents with   Abnormal Lab   Weakness    [name] is a 88 y.o. female with a history of cerebral AV malformation, atrial fibrillation, and CAD who presents to the ED today for abnormal labs.  Patient had labs done at her living facility which showed an elevated kidney function as well as an elevated BNP, provider recommended patient come to the ED for further evaluation.  Patient endorses increased generalized weakness over the past several days.  She states that she has not been eating much.  Denies any chest pain or shortness of breath. No fevers, abdominal pain, nausea, vomiting, or diarrhea.  She does endorse some sinus congestion.  Note from previous provider mentions cough, which patient denies at this time.  No additional complaints or concerns.    Home Medications Prior to Admission medications   Medication Sig Start Date End Date Taking? Authorizing Provider  acetaminophen  (TYLENOL ) 500 MG tablet Take 500 mg by mouth every 6 (six) hours as needed for mild pain or headache.    [provider]  amLODipine  (NORVASC ) 5 MG tablet Take 1 tablet (5 mg total) by mouth daily. 03/25/23   Wert, Christina, NP  b complex vitamins capsule Take 1 capsule by mouth daily.    [provider]  cetirizine  (ZYRTEC  ALLERGY) 10 MG tablet Take 1 tablet (10 mg total) by mouth at bedtime for 14 days. 09/17/23 10/01/23  Raylene Calamity, NP  dextromethorphan-guaiFENesin  (ROBITUSSIN-DM) 10-100 MG/5ML liquid Take 10 mLs by mouth every 6 (six) hours as needed for cough.    [provider]  doxycycline  (VIBRA -TABS) 100 MG tablet Take 1 tablet (100 mg total) by mouth 2 (two) times daily for 7 days. 09/21/23 09/28/23  Fargo, Amy E, NP  fluticasone  (FLONASE ) 50 MCG/ACT nasal spray Place into both nostrils daily.     [provider]  hydrALAZINE  (APRESOLINE ) 10 MG tablet Take 10 mg by mouth daily as needed.    [provider]  hydrALAZINE  (APRESOLINE ) 50 MG tablet Take 50 mg by mouth 4 (four) times daily.    [provider]  ipratropium-albuterol  (DUONEB) 0.5-2.5 (3) MG/3ML SOLN Take 3 mLs by nebulization every 6 (six) hours as needed.    [provider]  Iron , Ferrous Sulfate , 325 (65 Fe) MG TABS Take 325 mg by mouth 3 (three) times a week. 07/08/23   Raylene Calamity, NP  loperamide  (IMODIUM  A-D) 2 MG tablet Take 2 mg by mouth 3 (three) times daily as needed for diarrhea or loose stools.    [provider]  magnesium  oxide (MAG-OX) 400 (240 Mg) MG tablet Take 400 mg by mouth 2 (two) times daily.    [provider]  Multiple Vitamins-Iron  (MULTI-VITAMIN/IRON  PO) Take by mouth.    [provider]  ondansetron  (ZOFRAN -ODT) 4 MG disintegrating tablet Take 4 mg by mouth every 4 (four) hours as needed for nausea or vomiting. 02/20/22   [provider]  PHENobarbital  (LUMINAL) 64.8 MG tablet Take 1 tablet (64.8 mg total) by mouth daily. Take 1 tablet every morning 04/11/23   Wert, Christina, NP  phenytoin  (DILANTIN ) 100 MG ER capsule Take 200 mg by mouth daily.    [provider]  Polyethyl Glycol-Propyl Glycol (SYSTANE) 0.4-0.3 % SOLN Place 1 drop into both eyes in the morning and at bedtime.  [provider]  predniSONE  (DELTASONE ) 20 MG tablet Take 2 tablets (40 mg total) by mouth daily with breakfast for 5 days, THEN 1 tablet (20 mg total) daily with breakfast for 2 days. 09/21/23 09/28/23  Fargo, Amy E, NP  torsemide  (DEMADEX ) 20 MG tablet Take 1 tablet (20 mg total) by mouth 3 (three) times a week. 07/08/23   Raylene Calamity, NP  triamcinolone  cream (KENALOG ) 0.1 % Apply 1 Application topically 2 (two) times daily as needed. 07/08/23   Raylene Calamity, NP  valsartan  (DIOVAN ) 160 MG tablet Take 1 tablet (160 mg total) by mouth 2  (two) times daily. 09/06/22   Raylene Calamity, NP      Allergies    Aleve [naproxen], Azithromycin, Penicillins, Pravastatin, Sulfa antibiotics, Sulfa drugs cross reactors, Tape, and Naproxen sodium    Review of Systems   Review of Systems  Neurological:  Positive for weakness.  All other systems reviewed and are negative.   Physical Exam Updated Vital Signs BP (!) 154/46 (BP Location: Left Arm)   Pulse 61   Temp (!) 97.4 F (36.3 C) (Oral)   Resp 16   Ht 5\' 5"  (1.651 m)   Wt 52.8 kg   SpO2 100%   BMI 19.37 kg/m  Physical Exam Vitals and nursing note reviewed.  Constitutional:      General: She is not in acute distress.    Appearance: Normal appearance.  HENT:     Head: Normocephalic and atraumatic.     Mouth/Throat:     Mouth: Mucous membranes are moist.  Eyes:     Conjunctiva/sclera: Conjunctivae normal.     Pupils: Pupils are equal, round, and reactive to light.  Cardiovascular:     Rate and Rhythm: Normal rate and regular rhythm.     Pulses: Normal pulses.     Heart sounds: Normal heart sounds.  Pulmonary:     Effort: Pulmonary effort is normal.     Breath sounds: Normal breath sounds.  Abdominal:     Palpations: Abdomen is soft.     Tenderness: There is no abdominal tenderness.  Musculoskeletal:        General: Normal range of motion.     Right lower leg: Edema present.     Left lower leg: Edema present.     Comments: Mild lower extremity edema  Skin:    General: Skin is warm and dry.     Findings: No rash.  Neurological:     General: No focal deficit present.     Mental Status: She is alert.     Sensory: No sensory deficit.     Motor: No weakness.  Psychiatric:        Mood and Affect: Mood normal.        Behavior: Behavior normal.     ED Results / Procedures / Treatments   Labs (all labs ordered are listed, but only abnormal results are displayed) Labs Reviewed  BASIC METABOLIC PANEL WITH GFR - Abnormal; Notable for the following components:       Result Value   Sodium 134 (*)    Glucose, Bld 114 (*)    BUN 119 (*)    Creatinine, Ser 2.14 (*)    GFR, Estimated 22 (*)    All other components within normal limits  CBC - Abnormal; Notable for the following components:   RBC 3.34 (*)    Hemoglobin 11.0 (*)    HCT 32.6 (*)    All other components within normal limits  BRAIN NATRIURETIC PEPTIDE - Abnormal; Notable for the following components:   B Natriuretic Peptide 104.7 (*)    All other components within normal limits  CBG MONITORING, ED - Abnormal; Notable for the following components:   Glucose-Capillary 109 (*)    All other components within normal limits  TROPONIN I (HIGH SENSITIVITY) - Abnormal; Notable for the following components:   Troponin I (High Sensitivity) 56 (*)    All other components within normal limits  URINALYSIS, ROUTINE W REFLEX MICROSCOPIC    EKG None  Radiology No results found.  Procedures Procedures: not indicated.   Medications Ordered in ED Medications  sodium chloride  0.9 % bolus 500 mL (500 mLs Intravenous New Bag/Given 09/25/23 1427)    ED Course/ Medical Decision Making/ A&P                                 Medical Decision Making Amount and/or Complexity of Data Reviewed Labs: ordered. Radiology: ordered.  Risk Prescription drug management. Decision regarding hospitalization.   This patient presents to the ED for concern of generalized weakness, this involves an extensive number of treatment options, and is a complaint that carries with it a high risk of complications and morbidity.   Differential diagnosis includes: dehydration, electrolyte derangement, AKI, CHF exacerbation, hypoglycemia, etc.   Comorbidities  See HPI above   Additional History  Additional history obtained from notes from provider at living facility.   Lab Tests  I ordered and personally interpreted labs.  The pertinent results include:   CBG of 109 CBC is reassuring - no signs of acute  infection or anemia Troponin is 56 - within normal limits for patient BNP of 104.7   Consultations  I requested consultation with Dr. Irene Mannheim with nephrology,  and discussed lab and imaging findings as well as pertinent plan - they recommend: stop torsemide  and slow infusion of fluids. Renal ultrasound to rule out obstruction as cause of AKI. I requested consultation with Dr. Nichole Barker with [facility],  and discussed lab and imaging findings as well as pertinent plan - they recommend: admission for further work up and management of symptoms.   Problem List / ED Course / Critical Interventions / Medication Management  Patient reports feeling generalized weakness for the past several days.  Reports decreased p.o. intake as well.  States that has not been urinating as much either.  No abdominal pain or back pain.  No nausea, vomiting, diarrhea.  No fevers. I ordered medications including: NS for AKI  Fluids given slowly due to history of CHF. Renal ultrasound ordered to rule out obstruction as cause of AKI. Pending at time of admission. I have reviewed the patients home medicines and have made adjustments as needed   Social Determinants of Health  Tobacco use   Test / Admission - Considered  Discussed findings with patient and family member at bedside. They are agreeable with plan for admission.       Final Clinical Impression(s) / ED Diagnoses Final diagnoses:  AKI (acute kidney injury) [facility])    Rx / DC Orders ED Discharge Orders     None         Sonnie Dusky, PA-C 09/25/23 1720    Albertus Hughs, DO 09/28/23 [phone]

## 2023-09-25 NOTE — H&P (Signed)
 History and Physical    Patient: [name] ZOX:[id number] DOB: 1935/09/07 DOA: 09/25/2023 DOS: the patient was seen and examined on 09/25/2023 PCP: Marguerite Shiley, MD  Patient coming from: assisted living at [facility]  Chief Complaint:  Chief Complaint  Patient presents with   Abnormal Lab   Weakness   HPI: [name] is a 88 y.o. female with medical history significant of HTN, MVP, PAF, CHB s/p pacemaker, neuropathy, encephalopathy, epilepsy, iron deficiency anemia, was brought in from [facility], for generalized weakness, low grade fever, cold and nasal congestion for one week.  She was prescribed doxycycline and prednisone taper by her PCP without any improvement. She also reports occasional dry cough.  She denies any nausea, vomiting , abdominal pain, diarrhea. She denies any dizziness, headache , any sensory abnormalities.    ED work up Labs reveal creatinine of 2.14, BUN 119, sodium of 134. BNP of 104.7, troponin 56 UA shows large leukocytes, many bacteria, .  She was referred to [facility] for further evaluation.    Review of Systems: As mentioned in the history of present illness. All other systems reviewed and are negative. Past Medical History:  Diagnosis Date   Abnormal cardiovascular stress test    Carotid arterial disease (HCC)    MODERATE RIGHT   Cerebral AV malformation    DDD (degenerative disc disease)    SEVERE WITH SPINAL STENOSIS   Hypercholesterolemia    Hypertension    MVP (mitral valve prolapse)    MILD   Numbness    LEFT FOOT AND ANKLE   Osteopenia    Presence of permanent cardiac pacemaker    Pseudo-gout    Past Surgical History:  Procedure Laterality Date   APPENDECTOMY     BREAST MASS EXCISION     CARPAL TUNNEL RELEASE     CHOLECYSTECTOMY N/A 04/23/2022   Procedure: LAPAROSCOPIC CHOLECYSTECTOMY;  Surgeon: Oza Blumenthal, MD;  Location: [facility] ORS;  Service: General;  Laterality: N/A;   gallbladder     PACEMAKER IMPLANT N/A  11/07/2021   Procedure: PACEMAKER IMPLANT;  Surgeon: Tammie Fall, MD;  Location: [facility] INVASIVE CV LAB;  Service: Cardiovascular;  Laterality: N/A;   REMOVAL OF PARATHYROID GLAND     STRESS NUCLEAR STUDY     ABNORMAL   TONSILLECTOMY     Social History:  reports that she quit smoking about 54 years ago. Her smoking use included cigarettes. She started smoking about 68 years ago. She has a 14 pack-year smoking history. She has never used smokeless tobacco. She reports that she does not drink alcohol and does not use drugs.  Allergies  Allergen Reactions   Aleve [Naproxen] Other (See Comments)   Azithromycin Other (See Comments)    Reaction not recalled   Penicillins Hives and Other (See Comments)    Bad reaction as a child after a shot of Penicillin   Pravastatin Other (See Comments)    Reaction not recalled   Sulfa Antibiotics Other (See Comments)   Sulfa Drugs Cross Reactors Other (See Comments)    Reaction not recalled- stated she can take it "in small doses"   Tape Itching and Other (See Comments)    Cannot wear for an extended period of time   Naproxen Sodium Rash    Family History  Problem Relation Age of Onset   Stroke Mother    Lung cancer Father    Pneumonia Brother    Breast cancer Neg Hx     Prior to Admission  medications   Medication Sig Start Date End Date Taking? Authorizing Provider  acetaminophen (TYLENOL) 500 MG tablet Take 500 mg by mouth every 6 (six) hours as needed for mild pain or headache.    [provider]  amLODipine (NORVASC) 5 MG tablet Take 1 tablet (5 mg total) by mouth daily. 03/25/23   Fletcher Anon, NP  b complex vitamins capsule Take 1 capsule by mouth daily.    [provider]  cetirizine (ZYRTEC ALLERGY) 10 MG tablet Take 1 tablet (10 mg total) by mouth at bedtime for 14 days. 09/17/23 10/01/23  Fletcher Anon, NP  dextromethorphan-guaiFENesin (ROBITUSSIN-DM) 10-100 MG/5ML liquid Take 10 mLs by mouth every 6 (six) hours as  needed for cough.    [provider]  doxycycline (VIBRA-TABS) 100 MG tablet Take 1 tablet (100 mg total) by mouth 2 (two) times daily for 7 days. 09/21/23 09/28/23  Fargo, Amy E, NP  fluticasone (FLONASE) 50 MCG/ACT nasal spray Place into both nostrils daily.    [provider]  hydrALAZINE (APRESOLINE) 10 MG tablet Take 10 mg by mouth daily as needed.    [provider]  hydrALAZINE (APRESOLINE) 50 MG tablet Take 50 mg by mouth 4 (four) times daily.    [provider]  ipratropium-albuterol (DUONEB) 0.5-2.5 (3) MG/3ML SOLN Take 3 mLs by nebulization every 6 (six) hours as needed.    [provider]  Iron, Ferrous Sulfate, 325 (65 Fe) MG TABS Take 325 mg by mouth 3 (three) times a week. 07/08/23   Fletcher Anon, NP  loperamide (IMODIUM A-D) 2 MG tablet Take 2 mg by mouth 3 (three) times daily as needed for diarrhea or loose stools.    [provider]  magnesium oxide (MAG-OX) 400 (240 Mg) MG tablet Take 400 mg by mouth 2 (two) times daily.    [provider]  Multiple Vitamins-Iron (MULTI-VITAMIN/IRON PO) Take by mouth.    [provider]  ondansetron (ZOFRAN-ODT) 4 MG disintegrating tablet Take 4 mg by mouth every 4 (four) hours as needed for nausea or vomiting. 02/20/22   [provider]  PHENobarbital (LUMINAL) 64.8 MG tablet Take 1 tablet (64.8 mg total) by mouth daily. Take 1 tablet every morning 04/11/23   Fletcher Anon, NP  phenytoin (DILANTIN) 100 MG ER capsule Take 200 mg by mouth daily.    [provider]  Polyethyl Glycol-Propyl Glycol (SYSTANE) 0.4-0.3 % SOLN Place 1 drop into both eyes in the morning and at bedtime.    [provider]  predniSONE (DELTASONE) 20 MG tablet Take 2 tablets (40 mg total) by mouth daily with breakfast for 5 days, THEN 1 tablet (20 mg total) daily with breakfast for 2 days. 09/21/23 09/28/23  Fargo, Amy E, NP  torsemide (DEMADEX) 20 MG tablet Take 1 tablet (20 mg  total) by mouth 3 (three) times a week. 07/08/23   Fletcher Anon, NP  triamcinolone cream (KENALOG) 0.1 % Apply 1 Application topically 2 (two) times daily as needed. 07/08/23   Fletcher Anon, NP  valsartan (DIOVAN) 160 MG tablet Take 1 tablet (160 mg total) by mouth 2 (two) times daily. 09/06/22   Fletcher Anon, NP    Physical Exam: Vitals:   09/25/23 1145 09/25/23 1449 09/25/23 1544  BP: (!) 149/43  (!) 154/46  Pulse: 63  61  Resp: 18  16  Temp: 97.8 F (36.6 C)  (!) 97.4 F (36.3 C)  TempSrc: Oral  Oral  SpO2: 100%  100%  Weight:  52.8  kg   Height:  5\' 5"  (1.651 m)    General exam: Appears calm and comfortable  Respiratory system: Clear to auscultation. Respiratory effort normal. Cardiovascular system: S1 & S2 heard, RRR. No JVD, murmurs,  Gastrointestinal system: Abdomen is nondistended, soft and nontender.  Central nervous system: Alert and oriented. No focal neurological deficits. Extremities: Symmetric 5 x 5 power. Skin: No rashes, Psychiatry: Mood & affect appropriate.   Data Reviewed: Results for orders placed or performed during the hospital encounter of 09/25/23 (from the past 24 hours)  CBG monitoring, ED     Status: Abnormal   Collection Time: 09/25/23 12:04 PM  Result Value Ref Range   Glucose-Capillary 109 (H) 70 - 99 mg/dL  Basic metabolic panel     Status: Abnormal   Collection Time: 09/25/23 12:45 PM  Result Value Ref Range   Sodium 134 (L) 135 - 145 mmol/L   Potassium 3.8 3.5 - 5.1 mmol/L   Chloride 98 98 - 111 mmol/L   CO2 22 22 - 32 mmol/L   Glucose, Bld 114 (H) 70 - 99 mg/dL   BUN 161 (H) 8 - 23 mg/dL   Creatinine, Ser 0.96 (H) 0.44 - 1.00 mg/dL   Calcium 8.9 8.9 - 04.5 mg/dL   GFR, Estimated 22 (L) >60 mL/min   Anion gap 14 5 - 15  CBC     Status: Abnormal   Collection Time: 09/25/23 12:45 PM  Result Value Ref Range   WBC 10.2 4.0 - 10.5 K/uL   RBC 3.34 (L) 3.87 - 5.11 MIL/uL   Hemoglobin 11.0 (L) 12.0 - 15.0 g/dL   HCT 40.9 (L) 81.1 -  46.0 %   MCV 97.6 80.0 - 100.0 fL   MCH 32.9 26.0 - 34.0 pg   MCHC 33.7 30.0 - 36.0 g/dL   RDW 91.4 78.2 - 95.6 %   Platelets 262 150 - 400 K/uL   nRBC 0.0 0.0 - 0.2 %  Brain natriuretic peptide     Status: Abnormal   Collection Time: 09/25/23 12:45 PM  Result Value Ref Range   B Natriuretic Peptide 104.7 (H) 0.0 - 100.0 pg/mL  Troponin I (High Sensitivity)     Status: Abnormal   Collection Time: 09/25/23 12:45 PM  Result Value Ref Range   Troponin I (High Sensitivity) 56 (H) <18 ng/L  Urinalysis, Routine w reflex microscopic -Urine, Clean Catch     Status: Abnormal   Collection Time: 09/25/23  3:46 PM  Result Value Ref Range   Color, Urine AMBER (A) YELLOW   APPearance TURBID (A) CLEAR   Specific Gravity, Urine 1.011 1.005 - 1.030   pH 5.0 5.0 - 8.0   Glucose, UA NEGATIVE NEGATIVE mg/dL   Hgb urine dipstick MODERATE (A) NEGATIVE   Bilirubin Urine NEGATIVE NEGATIVE   Ketones, ur NEGATIVE NEGATIVE mg/dL   Protein, ur 30 (A) NEGATIVE mg/dL   Nitrite NEGATIVE NEGATIVE   Leukocytes,Ua LARGE (A) NEGATIVE   RBC / HPF 21-50 0 - 5 RBC/hpf   WBC, UA >50 0 - 5 WBC/hpf   Bacteria, UA MANY (A) NONE SEEN   Squamous Epithelial / HPF 6-10 0 - 5 /HPF   WBC Clumps PRESENT    Non Squamous Epithelial 0-5 (A) NONE SEEN     Assessment and Plan:  Acute kidney injury:  Suspect from poor oral intake.  Get US renal to check for obstruction.  Gently hydrate and repeat renal parameters in am.  Holding torsemide and ACE inhibitors.  UA  is abnormal, check urine cultures and start ceftriaxone.     Hypertension:  Well controlled. Avoid hypotension.    Seizure disorder Resume phenobarbital.    CHB S/P PPM PAF Rate controlled.    Mild anemia Monitor hemoglobin.      Advance Care Planning:   Code Status: Limited: Do not attempt resuscitation (DNR) -DNR-LIMITED -Do Not Intubate/DNI    Consults: none.   Family Communication: none at bedside.   Severity of Illness: The  appropriate patient status for this patient is OBSERVATION. Observation status is judged to be reasonable and necessary in order to provide the required intensity of service to ensure the patient's safety. The patient's presenting symptoms, physical exam findings, and initial radiographic and laboratory data in the context of their medical condition is felt to place them at decreased risk for further clinical deterioration. Furthermore, it is anticipated that the patient will be medically stable for discharge from the hospital within 2 midnights of admission.   Author: Feliciana Horn, MD 09/25/2023 7:10 PM  For on call review [email or web address].

## 2023-09-25 NOTE — ED Triage Notes (Signed)
 Pt BIB by [facility] for abnormal lab results. Per EMS, pt reports increased BUN and kidney dysfunction. Pt endorses decreased UOP and generalized weakness. Normal PO intake.

## 2023-09-25 NOTE — ED Provider Triage Note (Signed)
 Emergency Medicine Provider Triage Evaluation Note  [name] , a 88 y.o. female  was evaluated in triage.  Pt complains of generalized weakness.  Patient reports poor appetite for the past several days.  She had increased BUN on recent labs and is here for kidney dysfunction.  Denies nausea, vomiting, diarrhea.  Review of Systems  Positive: Generalized weakness, anorexia Negative: N/V/D, abdominal pain  Physical Exam  BP (!) 149/43 (BP Location: Left Arm)   Pulse 63   Temp 97.8 F (36.6 C) (Oral)   Resp 18   SpO2 100%  Gen:   Awake, no distress   Resp:  Normal effort  MSK:   Moves extremities without difficulty  Other:    Medical Decision Making  Medically screening exam initiated at 12:09 PM.  Appropriate orders placed.  Cheryll Corti was informed that the remainder of the evaluation will be completed by another provider, this initial triage assessment does not replace that evaluation, and the importance of remaining in the ED until their evaluation is complete.    Sonnie Dusky, PA-C 09/25/23 1212

## 2023-09-25 NOTE — ED Notes (Signed)
 Patient transported to Ultrasound

## 2023-09-26 ENCOUNTER — Other Ambulatory Visit: Payer: Self-pay

## 2023-09-26 DIAGNOSIS — J189 Pneumonia, unspecified organism: Secondary | ICD-10-CM | POA: Diagnosis not present

## 2023-09-26 DIAGNOSIS — N2889 Other specified disorders of kidney and ureter: Secondary | ICD-10-CM | POA: Diagnosis present

## 2023-09-26 DIAGNOSIS — Z7401 Bed confinement status: Secondary | ICD-10-CM | POA: Diagnosis not present

## 2023-09-26 DIAGNOSIS — N390 Urinary tract infection, site not specified: Secondary | ICD-10-CM

## 2023-09-26 DIAGNOSIS — D649 Anemia, unspecified: Secondary | ICD-10-CM | POA: Diagnosis not present

## 2023-09-26 DIAGNOSIS — Z950 Presence of cardiac pacemaker: Secondary | ICD-10-CM | POA: Diagnosis not present

## 2023-09-26 DIAGNOSIS — Z66 Do not resuscitate: Secondary | ICD-10-CM | POA: Diagnosis not present

## 2023-09-26 DIAGNOSIS — Z823 Family history of stroke: Secondary | ICD-10-CM | POA: Diagnosis not present

## 2023-09-26 DIAGNOSIS — Z79899 Other long term (current) drug therapy: Secondary | ICD-10-CM | POA: Diagnosis not present

## 2023-09-26 DIAGNOSIS — R799 Abnormal finding of blood chemistry, unspecified: Secondary | ICD-10-CM

## 2023-09-26 DIAGNOSIS — I2510 Atherosclerotic heart disease of native coronary artery without angina pectoris: Secondary | ICD-10-CM | POA: Diagnosis present

## 2023-09-26 DIAGNOSIS — Z886 Allergy status to analgesic agent status: Secondary | ICD-10-CM | POA: Diagnosis not present

## 2023-09-26 DIAGNOSIS — D509 Iron deficiency anemia, unspecified: Secondary | ICD-10-CM | POA: Diagnosis not present

## 2023-09-26 DIAGNOSIS — R531 Weakness: Secondary | ICD-10-CM | POA: Diagnosis not present

## 2023-09-26 DIAGNOSIS — Z87891 Personal history of nicotine dependence: Secondary | ICD-10-CM | POA: Diagnosis not present

## 2023-09-26 DIAGNOSIS — Z801 Family history of malignant neoplasm of trachea, bronchus and lung: Secondary | ICD-10-CM | POA: Diagnosis not present

## 2023-09-26 DIAGNOSIS — I442 Atrioventricular block, complete: Secondary | ICD-10-CM | POA: Diagnosis not present

## 2023-09-26 DIAGNOSIS — G40909 Epilepsy, unspecified, not intractable, without status epilepticus: Secondary | ICD-10-CM | POA: Diagnosis not present

## 2023-09-26 DIAGNOSIS — I10 Essential (primary) hypertension: Secondary | ICD-10-CM | POA: Diagnosis not present

## 2023-09-26 DIAGNOSIS — N179 Acute kidney failure, unspecified: Secondary | ICD-10-CM | POA: Diagnosis not present

## 2023-09-26 DIAGNOSIS — I5032 Chronic diastolic (congestive) heart failure: Secondary | ICD-10-CM | POA: Diagnosis not present

## 2023-09-26 DIAGNOSIS — R636 Underweight: Secondary | ICD-10-CM | POA: Diagnosis present

## 2023-09-26 DIAGNOSIS — I341 Nonrheumatic mitral (valve) prolapse: Secondary | ICD-10-CM | POA: Diagnosis not present

## 2023-09-26 DIAGNOSIS — E7800 Pure hypercholesterolemia, unspecified: Secondary | ICD-10-CM | POA: Diagnosis present

## 2023-09-26 DIAGNOSIS — L89151 Pressure ulcer of sacral region, stage 1: Secondary | ICD-10-CM | POA: Diagnosis not present

## 2023-09-26 DIAGNOSIS — Z880 Allergy status to penicillin: Secondary | ICD-10-CM | POA: Diagnosis not present

## 2023-09-26 DIAGNOSIS — I480 Paroxysmal atrial fibrillation: Secondary | ICD-10-CM | POA: Diagnosis not present

## 2023-09-26 DIAGNOSIS — I2489 Other forms of acute ischemic heart disease: Secondary | ICD-10-CM | POA: Diagnosis not present

## 2023-09-26 DIAGNOSIS — R059 Cough, unspecified: Secondary | ICD-10-CM | POA: Diagnosis not present

## 2023-09-26 DIAGNOSIS — Z681 Body mass index (BMI) 19 or less, adult: Secondary | ICD-10-CM | POA: Diagnosis not present

## 2023-09-26 DIAGNOSIS — I110 Hypertensive heart disease with heart failure: Secondary | ICD-10-CM | POA: Diagnosis not present

## 2023-09-26 DIAGNOSIS — Z882 Allergy status to sulfonamides status: Secondary | ICD-10-CM | POA: Diagnosis not present

## 2023-09-26 DIAGNOSIS — R918 Other nonspecific abnormal finding of lung field: Secondary | ICD-10-CM | POA: Diagnosis not present

## 2023-09-26 LAB — BASIC METABOLIC PANEL WITH GFR
Anion gap: 13 (ref 5–15)
BUN: 105 mg/dL — ABNORMAL HIGH (ref 8–23)
CO2: 21 mmol/L — ABNORMAL LOW (ref 22–32)
Calcium: 8.8 mg/dL — ABNORMAL LOW (ref 8.9–10.3)
Chloride: 102 mmol/L (ref 98–111)
Creatinine, Ser: 1.72 mg/dL — ABNORMAL HIGH (ref 0.44–1.00)
GFR, Estimated: 29 mL/min — ABNORMAL LOW (ref >60)
Glucose, Bld: 91 mg/dL (ref 70–99)
Potassium: 3.8 mmol/L (ref 3.5–5.1)
Sodium: 136 mmol/L (ref 135–145)

## 2023-09-26 LAB — CBC WITH DIFFERENTIAL/PLATELET
Abs Immature Granulocytes: 0.09 10*3/uL — ABNORMAL HIGH (ref 0.00–0.07)
Basophils Absolute: 0 10*3/uL (ref 0.0–0.1)
Basophils Relative: 0 %
Eosinophils Absolute: 0 10*3/uL (ref 0.0–0.5)
Eosinophils Relative: 0 %
HCT: 30.8 % — ABNORMAL LOW (ref 36.0–46.0)
Hemoglobin: 10.7 g/dL — ABNORMAL LOW (ref 12.0–15.0)
Immature Granulocytes: 1 %
Lymphocytes Relative: 11 %
Lymphs Abs: 1 10*3/uL (ref 0.7–4.0)
MCH: 33.6 pg (ref 26.0–34.0)
MCHC: 34.7 g/dL (ref 30.0–36.0)
MCV: 96.9 fL (ref 80.0–100.0)
Monocytes Absolute: 1 10*3/uL (ref 0.1–1.0)
Monocytes Relative: 11 %
Neutro Abs: 7.2 10*3/uL (ref 1.7–7.7)
Neutrophils Relative %: 77 %
Platelets: 264 10*3/uL (ref 150–400)
RBC: 3.18 MIL/uL — ABNORMAL LOW (ref 3.87–5.11)
RDW: 12.4 % (ref 11.5–15.5)
WBC: 9.4 10*3/uL (ref 4.0–10.5)
nRBC: 0 % (ref 0.0–0.2)

## 2023-09-26 LAB — CBC
HCT: 32.1 % — ABNORMAL LOW (ref 36.0–46.0)
Hemoglobin: 10.8 g/dL — ABNORMAL LOW (ref 12.0–15.0)
MCH: 32.7 pg (ref 26.0–34.0)
MCHC: 33.6 g/dL (ref 30.0–36.0)
MCV: 97.3 fL (ref 80.0–100.0)
Platelets: 188 10*3/uL (ref 150–400)
RBC: 3.3 MIL/uL — ABNORMAL LOW (ref 3.87–5.11)
RDW: 12.4 % (ref 11.5–15.5)
WBC: 9 10*3/uL (ref 4.0–10.5)
nRBC: 0 % (ref 0.0–0.2)

## 2023-09-26 MED ORDER — ORAL CARE MOUTH RINSE: 15.0000 mL | OROMUCOSAL | Status: DC | PRN

## 2023-09-26 MED ORDER — PREDNISONE 20 MG PO TABS
20.0000 mg | ORAL_TABLET | Freq: Every day | ORAL | Status: AC
  Administered 2023-09-26 – 2023-09-27 (×2): 20 mg via ORAL
  Filled 2023-09-26 (×2): qty 1

## 2023-09-26 NOTE — Plan of Care (Signed)

## 2023-09-26 NOTE — Care Management Obs Status (Signed)
 MEDICARE OBSERVATION STATUS NOTIFICATION   Patient Details  Name: [name] MRN: [id number] Date of Birth: [birth year]   Medicare Observation Status Notification Given:  Yes  Moon/Obs letter signed and copy given  Wynonia Hedges 09/26/2023, 3:36 PM

## 2023-09-26 NOTE — Progress Notes (Signed)
 [facility]                                                                               [name], is a 88 y.o. female, DOB - [birth year], WUJ:[id number] Admit date - 09/25/2023    Outpatient Primary MD for the patient is Mahlon Gammon, MD  LOS - 0  days    Brief summary    [name] is a 88 y.o. female with medical history significant of HTN, MVP, PAF, CHB s/p pacemaker, neuropathy, encephalopathy, epilepsy, iron deficiency anemia, was brought in from [facility], for generalized weakness, low grade fever, cold and nasal congestion for one week.  WORK UP revealed AKI.   Assessment & Plan    Assessment and Plan:    Acute kidney injury:  Suspect from poor oral intake/ dehydration US kidneys are negative for hydronephrosis.  Hydrated with IV fluids, and repeat renal parameters show improvement.  Holding torsemide and ACE inhibitors.  UA is abnormal, check urine cultures and start ceftriaxone.        Hypertension:  Well controlled. Avoid hypotension.      Seizure disorder Resume phenobarbital and dilantin.      CHB S/P PPM PAF Rate controlled.      Mild anemia Monitor hemoglobin.   Mildly elevated troponin Suspect from demand ischemia from AKI and dehydration.     Estimated body mass index is 19.37 kg/m as calculated from the following:   Height as of this encounter: 5\' 5"  (1.651 m).   Weight as of this encounter: 52.8 kg.  Code Status: DNR DVT Prophylaxis:  enoxaparin (LOVENOX) injection 30 mg Start: 09/25/23 2200   Level of Care: Level of care: Med-Surg Family Communication: Updated patient's daughter at bedside.   Disposition Plan:     Remains inpatient appropriate:  IV fluids.   Procedures:  None.   Consultants:   None.   Antimicrobials:   Anti-infectives (From admission, onward)    Start     Dose/Rate Route Frequency Ordered Stop   09/25/23 1900  cefTRIAXone (ROCEPHIN) 1 g in sodium chloride 0.9 % 100 mL  IVPB        1 g 200 mL/hr over 30 Minutes Intravenous Every 24 hours 09/25/23 1825          Medications  Scheduled Meds:  amLODipine  5 mg Oral Daily   enoxaparin (LOVENOX) injection  30 mg Subcutaneous Q24H   fluticasone  2 spray Each Nare Daily   loratadine  10 mg Oral Daily   PHENobarbital  64.8 mg Oral Daily   phenytoin  200 mg Oral Daily   predniSONE  20 mg Oral Daily   Continuous Infusions:  sodium chloride Stopped (09/26/23 1342)   cefTRIAXone (ROCEPHIN)  IV Stopped (09/25/23 2157)   PRN Meds:.acetaminophen, guaiFENesin-dextromethorphan, ipratropium-albuterol, ondansetron, mouth rinse    Subjective:   [name] was seen and examined today.  Slowly improving. No chest pain or sob. No nausea, vomiting or abd pain.   Objective:   Vitals:   09/25/23 2131 09/26/23 0505 09/26/23 1019 09/26/23 1019  BP: (!) 167/46 (!) 122/39 (!) 122/34 (!) 122/34  Pulse: 64 (!) 59 60 (!)  57  Resp: 18 18 18 18   Temp: (!) 97.5 F (36.4 C) 97.9 F (36.6 C) 97.8 F (36.6 C) 97.8 F (36.6 C)  TempSrc: Oral     SpO2: 99% 97% 95% 97%  Weight:      Height:        Intake/Output Summary (Last 24 hours) at 09/26/2023 1430 Last data filed at 09/26/2023 1356 Gross per 24 hour  Intake 1736.34 ml  Output --  Net 1736.34 ml   Filed Weights   09/25/23 1449  Weight: 52.8 kg     Exam General exam: Appears calm and comfortable  Respiratory system: Clear to auscultation. Respiratory effort normal. Cardiovascular system: S1 & S2 heard, RRR. No JVD,  Gastrointestinal system: Abdomen is nondistended, soft and nontender.  Central nervous system: Alert and oriented. No focal neurological deficits. Extremities: Symmetric 5 x 5 power. Skin: No rashes, lesions or ulcers Psychiatry:. Mood & affect appropriate.     Data Reviewed:  I have personally reviewed following labs and imaging studies   CBC Lab Results  Component Value Date   WBC 9.4 09/26/2023   RBC 3.18 (L) 09/26/2023    HGB 10.7 (L) 09/26/2023   HCT 30.8 (L) 09/26/2023   MCV 96.9 09/26/2023   MCH 33.6 09/26/2023   PLT 264 09/26/2023   MCHC 34.7 09/26/2023   RDW 12.4 09/26/2023   LYMPHSABS 1.0 09/26/2023   MONOABS 1.0 09/26/2023   EOSABS 0.0 09/26/2023   BASOSABS 0.0 09/26/2023     Last metabolic panel Lab Results  Component Value Date   NA 136 09/26/2023   K 3.8 09/26/2023   CL 102 09/26/2023   CO2 21 (L) 09/26/2023   BUN 105 (H) 09/26/2023   CREATININE 1.72 (H) 09/26/2023   GLUCOSE 91 09/26/2023   GFRNONAA 29 (L) 09/26/2023   GFRAA 53.34 04/25/2021   CALCIUM 8.8 (L) 09/26/2023   PHOS 3.5 04/25/2022   PROT 5.9 (L) 04/26/2022   ALBUMIN 3.5 06/13/2023   BILITOT 0.9 04/26/2022   ALKPHOS 74 06/13/2023   AST 30 06/13/2023   ALT 21 06/13/2023   ANIONGAP 13 09/26/2023    CBG (last 3)  Recent Labs    09/25/23 1204  GLUCAP 109*      Coagulation Profile: No results for input(s): "INR", "PROTIME" in the last 168 hours.   Radiology Studies: US  Renal Result Date: 09/25/2023 CLINICAL DATA:  Acute kidney injury EXAM: RENAL / URINARY TRACT ULTRASOUND COMPLETE COMPARISON:  04/22/2022 FINDINGS: Right Kidney: Renal measurements: 8.7 x 4.2 x 4.7 cm = volume: 90 mL. 1.9 cm midpole cyst which appears simple. No follow-up imaging recommended. Cortical thinning. Normal echotexture. No hydronephrosis. Left Kidney: Renal measurements: 9.6 x 5.3 x 5.4 cm = volume: 46 mL. Cortical thinning. Normal echotexture. No mass or hydronephrosis. Bladder: Decompressed Other: None. IMPRESSION: No acute findings.  No hydronephrosis. Electronically Signed   By: Janeece Mechanic M.D.   On: 09/25/2023 22:06       Feliciana Horn M.D. [facility] 09/26/2023, 2:30 PM  Available via Epic secure chat 7am-7pm After 7 pm, please refer to night coverage provider listed on amion.

## 2023-09-27 ENCOUNTER — Inpatient Hospital Stay (HOSPITAL_COMMUNITY)

## 2023-09-27 DIAGNOSIS — N179 Acute kidney failure, unspecified: Secondary | ICD-10-CM | POA: Diagnosis not present

## 2023-09-27 DIAGNOSIS — R799 Abnormal finding of blood chemistry, unspecified: Secondary | ICD-10-CM | POA: Diagnosis not present

## 2023-09-27 DIAGNOSIS — I442 Atrioventricular block, complete: Secondary | ICD-10-CM | POA: Diagnosis not present

## 2023-09-27 DIAGNOSIS — I10 Essential (primary) hypertension: Secondary | ICD-10-CM | POA: Diagnosis not present

## 2023-09-27 LAB — BASIC METABOLIC PANEL WITH GFR
Anion gap: 9 (ref 5–15)
BUN: 86 mg/dL — ABNORMAL HIGH (ref 8–23)
CO2: 22 mmol/L (ref 22–32)
Calcium: 8.6 mg/dL — ABNORMAL LOW (ref 8.9–10.3)
Chloride: 107 mmol/L (ref 98–111)
Creatinine, Ser: 1.44 mg/dL — ABNORMAL HIGH (ref 0.44–1.00)
GFR, Estimated: 36 mL/min — ABNORMAL LOW (ref >60)
Glucose, Bld: 90 mg/dL (ref 70–99)
Potassium: 3.9 mmol/L (ref 3.5–5.1)
Sodium: 138 mmol/L (ref 135–145)

## 2023-09-27 MED ORDER — GUAIFENESIN-DM 100-10 MG/5ML PO SYRP: 10.0000 mL | ORAL_SOLUTION | Freq: Four times a day (QID) | ORAL | 0 refills | Status: AC | PRN

## 2023-09-27 MED ORDER — LEVOFLOXACIN 250 MG PO TABS: 500.0000 mg | ORAL_TABLET | Freq: Every day | ORAL | 0 refills | Status: AC

## 2023-09-27 MED ORDER — ACETAMINOPHEN 500 MG PO TABS: 500.0000 mg | ORAL_TABLET | Freq: Four times a day (QID) | ORAL | 0 refills | Status: AC | PRN

## 2023-09-27 MED ORDER — TORSEMIDE 20 MG PO TABS: 20.0000 mg | ORAL_TABLET | ORAL | Status: DC

## 2023-09-27 NOTE — NC FL2 (Signed)
 [place]  MEDICAID FL2 LEVEL OF CARE FORM     IDENTIFICATION  Patient Name: [name] Birthdate: 04/24/1936 Sex: female Admission Date (Current Location): 09/25/2023  [facility] and [place] Number:  [employer], Television/film/video and Address:  [facility] [place], [address] [place], [place] [address]      Provider Number: [id number]  Attending Physician Name and Address:  Cherlyn Labella, MD  Relative Name and Phone Number:       Current Level of Care: Hospital Recommended Level of Care: Skilled Nursing Facility Prior Approval Number:    Date Approved/Denied:   PASRR Number: [id number]  Discharge Plan: SNF    Current Diagnoses: Patient Active Problem List   Diagnosis Date Noted   AKI (acute kidney injury) (HCC) 09/25/2023   Cholecystitis with cholelithiasis 04/24/2022   Acute cholecystitis 04/22/2022   Hyperglycemia 04/22/2022   Hypocalcemia 04/22/2022   Neuropathy 04/05/2022   Pericardial effusion without cardiac tamponade 01/28/2022   Acute pericarditis    Pacemaker    Heart block AV complete (HCC)    Paroxysmal atrial fibrillation (HCC)    Aortic atherosclerosis (HCC)    Chest pain 01/27/2022   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   Syncope 10/15/2021   Hypertension    Cerebral AV malformation    Dyslipidemia    Acute kidney injury superimposed on chronic kidney disease (HCC)    Elevated LFTs 05/24/2021   Iron  deficiency anemia 05/24/2021   Macrocytic anemia 05/17/2021   Hypokalemia 04/11/2021   Pressure injury of skin 07/22/2020   Hyponatremia 07/21/2020   Mixed hyperlipidemia 07/21/2020   Acute metabolic encephalopathy 07/21/2020   History of complex partial epilepsy 07/21/2020   Hypomagnesemia 07/21/2020   Hypokalemia due to excessive gastrointestinal loss of potassium 07/21/2020   Diarrhea 07/21/2020   Partial symptomatic epilepsy with complex partial seizures, not intractable, without status epilepticus (HCC) 07/28/2014   Spinal  stenosis of lumbar region 07/28/2014   Abnormality of gait 11/24/2013   Low back pain 11/24/2013   Essential hypertension    Hypercholesterolemia    Carotid arterial disease (HCC)    MVP (mitral valve prolapse)    Abnormal cardiovascular stress test     Orientation RESPIRATION BLADDER Height & Weight        Normal Continent Weight: 116 lb 6.5 oz (52.8 kg) Height:  5' 5 (165.1 cm)  BEHAVIORAL SYMPTOMS/MOOD NEUROLOGICAL BOWEL NUTRITION STATUS      Continent Diet (regular diet)  AMBULATORY STATUS COMMUNICATION OF NEEDS Skin   Extensive Assist Verbally Other (Comment) (presssure injury coccyx right leg stage I)                       Personal Care Assistance Level of Assistance  Bathing, Feeding, Dressing Bathing Assistance: Maximum assistance Feeding assistance: Independent Dressing Assistance: Maximum assistance     Functional Limitations Info  Sight, Hearing, Speech Sight Info: Adequate Hearing Info: Adequate Speech Info: Adequate    SPECIAL CARE FACTORS FREQUENCY  PT (By licensed PT), OT (By licensed OT)     PT Frequency: 5x per week OT Frequency: 5x per week            Contractures Contractures Info: Not present    Additional Factors Info  Code Status, Allergies Code Status Info: DNR Limited Allergies Info: Aleve,Azithromycin,Penicillins,Pravastatin,Sulfa Antibiotics,Naproxen Sodium,Tape,Sulfa Drugs Cross Reactors           Current Medications (09/27/2023):  This is the current hospital active medication list Current Facility-Administered Medications  Medication  Dose Route Frequency Provider Last Rate Last Admin   acetaminophen  (TYLENOL ) tablet 500 mg  500 mg Oral Q6H PRN Akula, Vijaya, MD       amLODipine  (NORVASC ) tablet 5 mg  5 mg Oral Daily Akula, Vijaya, MD   5 mg at 09/27/23 0910   cefTRIAXone  (ROCEPHIN ) 1 g in sodium chloride  0.9 % 100 mL IVPB  1 g Intravenous Q24H Akula, Vijaya, MD 200 mL/hr at 09/26/23 1820 1 g at 09/26/23 1820   enoxaparin   (LOVENOX ) injection 30 mg  30 mg Subcutaneous Q24H Akula, Vijaya, MD   30 mg at 09/26/23 2141   fluticasone  (FLONASE ) 50 MCG/ACT nasal spray 2 spray  2 spray Each Nare Daily Akula, Vijaya, MD   2 spray at 09/27/23 0912   guaiFENesin -dextromethorphan (ROBITUSSIN DM) 100-10 MG/5ML syrup 10 mL  10 mL Oral Q6H PRN Cherlyn Labella, MD       ipratropium-albuterol  (DUONEB) 0.5-2.5 (3) MG/3ML nebulizer solution 3 mL  3 mL Nebulization Q6H PRN Cherlyn Labella, MD       loratadine  (CLARITIN ) tablet 10 mg  10 mg Oral Daily Akula, Vijaya, MD   10 mg at 09/27/23 9088   ondansetron  (ZOFRAN -ODT) disintegrating tablet 4 mg  4 mg Oral Q4H PRN Akula, Vijaya, MD       Oral care mouth rinse  15 mL Mouth Rinse PRN Akula, Vijaya, MD       PHENobarbital  (LUMINAL) tablet 64.8 mg  64.8 mg Oral Daily Akula, Vijaya, MD   64.8 mg at 09/27/23 9090   phenytoin  (DILANTIN ) ER capsule 200 mg  200 mg Oral Daily Akula, Vijaya, MD   200 mg at 09/27/23 9087     Discharge Medications: Please see discharge summary for a list of discharge medications.  Relevant Imaging Results:  Relevant Lab Results:   Additional Information SSN [id number]  Montie LOISE Louder, LCSW

## 2023-09-27 NOTE — Evaluation (Signed)
 Occupational Therapy Evaluation Patient Details Name: [name] MRN: [id number] DOB: 12/19/1935 Today's Date: 09/27/2023   History of Present Illness   Pt is an 88 y/o female who presents 09/25/2023 from [facility] with generalized weakness, low grade fever, cold and nasal congestion x1 week. She was found to have UTI and AKI. PMH significant for HTN, CAD, cerebral AV malformation, DDD, HTN, MVP, osteopenia, permanent cardiac pacemaker, parathyroid  gland removal.     Clinical Impressions Pt admitted for above, PTA pt lived at ALF and was ind with ADLs, having supervision for showers and using rollator to ambulate. Pt presenting as generally weak and noted to be positive for orthostatics with OOB attempts. Pt able to stand with CGA but remains limited by low BP and dizziness. Pt requiring Mod A to setup A with ADLs at this time. OT to continue following pt acutely to address listed deficits and help transition to next level of care. Patient would benefit from post acute skilled rehab facility with <3 hours of therapy and 24/7 support      If plan is discharge home, recommend the following:   A little help with walking and/or transfers;A little help with bathing/dressing/bathroom;Assistance with cooking/housework;Direct supervision/assist for financial management;Supervision due to cognitive status;Direct supervision/assist for medications management     Functional Status Assessment   Patient has had a recent decline in their functional status and demonstrates the ability to make significant improvements in function in a reasonable and predictable amount of time.     Equipment Recommendations   None recommended by OT (defer)     Recommendations for Other Services         Precautions/Restrictions   Precautions Precautions: Fall Recall of Precautions/Restrictions: Impaired Precaution/Restrictions Comments: watch BP Restrictions Weight Bearing Restrictions Per  Provider Order: No     Mobility Bed Mobility Overal bed mobility: Needs Assistance Bed Mobility: Supine to Sit, Sit to Supine     Supine to sit: Supervision Sit to supine: Min assist   General bed mobility comments: min A to assist RLE with return to bed.    Transfers Overall transfer level: Needs assistance Equipment used: Rolling walker (2 wheels) Transfers: Sit to/from Stand Sit to Stand: Contact guard assist           General transfer comment: STS x2 from EOB with CGA, pt dizzy upon standing (+ for orhtostatics) . Lateral steps at bedside with CGA      Balance Overall balance assessment: Needs assistance Sitting-balance support: Feet supported, No upper extremity supported Sitting balance-Leahy Scale: Fair Sitting balance - Comments: static sitting   Standing balance support: No upper extremity supported, During functional activity, Reliant on assistive device for balance Standing balance-Leahy Scale: Poor Standing balance comment: reliant on RW                           ADL either performed or assessed with clinical judgement   ADL Overall ADL's : Needs assistance/impaired Eating/Feeding: Sitting;Set up   Grooming: Sitting;Set up   Upper Body Bathing: Sitting;Set up   Lower Body Bathing: Sitting/lateral leans;Minimal assistance   Upper Body Dressing : Sitting;Set up   Lower Body Dressing: Sit to/from stand;Moderate assistance   Toilet Transfer: Contact guard assist;Squat-pivot;Rolling walker (2 wheels)   Toileting- Clothing Manipulation and Hygiene: Sitting/lateral lean;Moderate assistance         General ADL Comments: deferred furhter ambulation as pt was orthostatic     Vision   Vision Assessment?:  Vision impaired- to be further tested in functional context Additional Comments: drooping of R eye lid noticed, pt also noted having a hard time locating banana pudding on tray table. Pt daughter reports her eye droop has been present  since admission     Perception         Praxis         Pertinent Vitals/Pain Pain Assessment Pain Assessment: Faces Faces Pain Scale: Hurts little more Pain Location: Bottom Pain Descriptors / Indicators: Sore Pain Intervention(s): Limited activity within patient's tolerance, Monitored during session, Repositioned     Extremity/Trunk Assessment Upper Extremity Assessment Upper Extremity Assessment: Generalized weakness;RUE deficits/detail RUE Deficits / Details: Pt reports a hx of RUE injury and able to perform AROM of 80 degrees shoulder flexion, elbow  with ~25 degree ext lag at baseline. overall 3/5 MMT except elbow ext 4/5       Cervical / Trunk Assessment Cervical / Trunk Assessment: Kyphotic   Communication Communication Communication: No apparent difficulties   Cognition Arousal: Alert Behavior During Therapy: Flat affect Cognition: Cognition impaired   Orientation impairments: Time Awareness: Online awareness impaired, Intellectual awareness intact Memory impairment (select all impairments): Working memory Attention impairment (select first level of impairment): Selective attention Executive functioning impairment (select all impairments): Reasoning, Problem solving                   Following commands: Intact       Cueing  General Comments   Cueing Techniques: Verbal cues  Bp sitting EOB 118/40 (62), standing 72/58(64). Pt c/o dizziness with prolonged standing. Pt bottom wound assessed, pillow repositioned for pressure relief. Sacral wound noted to be moist so it was pat dried with paper towel.   Exercises     Shoulder Instructions      Home Living Family/patient expects to be discharged to:: Assisted living                             Home Equipment: Rollator (4 wheels) (Has an electric wheelchair ordered at [employer], needs to finish the training on it.)   Additional Comments: [facility]      Prior  Functioning/Environment Prior Level of Function : Needs assist             Mobility Comments: Uses the rollator all the time. Reports even with the rollator sometimes needs help getting around due to neuropathy. ADLs Comments: Supervision mostly with her showers, but someone is there if she needs assist. Needs assist with getting TED hose on but other than that can do her own dressing.    OT Problem List: Decreased strength;Impaired balance (sitting and/or standing);Decreased knowledge of precautions   OT Treatment/Interventions: Self-care/ADL training;Patient/family education;Therapeutic exercise;Balance training;Therapeutic activities;Cognitive remediation/compensation      OT Goals(Current goals can be found in the care plan section)   Acute Rehab OT Goals Patient Stated Goal: To get better, go back to ALF OT Goal Formulation: With patient Time For Goal Achievement: 10/11/23 Potential to Achieve Goals: Good ADL Goals Pt Will Perform Grooming: with modified independence;sitting Pt Will Perform Lower Body Dressing: sit to/from stand;with contact guard assist Pt Will Transfer to Toilet: with supervision;bedside commode;stand pivot transfer Pt Will Perform Tub/Shower Transfer: Shower transfer;with contact guard assist;rolling walker;ambulating Additional ADL Goal #3: Pt will tolerate >31mins OOB activity   OT Frequency:  Min 2X/week    Co-evaluation  AM-PAC OT 6 Clicks Daily Activity     Outcome Measure Help from another person eating meals?: A Little Help from another person taking care of personal grooming?: A Little Help from another person toileting, which includes using toliet, bedpan, or urinal?: A Lot Help from another person bathing (including washing, rinsing, drying)?: A Little Help from another person to put on and taking off regular upper body clothing?: A Little Help from another person to put on and taking off regular lower body clothing?: A  Lot 6 Click Score: 16   End of Session Equipment Utilized During Treatment: Gait belt;Rolling walker (2 wheels) Nurse Communication: Mobility status  Activity Tolerance: Treatment limited secondary to medical complications (Comment) Patient left: in bed;with call bell/phone within reach;with bed alarm set;with family/visitor present  OT Visit Diagnosis: Unsteadiness on feet (R26.81);Other abnormalities of gait and mobility (R26.89);Muscle weakness (generalized) (M62.81)                Time: 8445-8381 OT Time Calculation (min): 24 min Charges:  OT General Charges $OT Visit: 1 Visit OT Evaluation $OT Eval Low Complexity: 1 Low OT Treatments $Therapeutic Activity: 8-22 mins  09/27/2023  AB, OTR/L  Acute Rehabilitation Services  Office: [phone]   Curtistine JONETTA Das 09/27/2023, 5:30 PM

## 2023-09-27 NOTE — Evaluation (Signed)
 Physical Therapy Evaluation  Patient Details Name: [name] MRN: [id number] DOB: [birth year] Today's Date: 09/27/2023  History of Present Illness  Pt is an 88 y/o female who presents 09/25/2023 from [facility] with generalized weakness, low grade fever, cold and nasal congestion x1 week. She was found to have UTI and AKI. PMH significant for HTN, CAD, cerebral AV malformation, DDD, HTN, MVP, osteopenia, permanent cardiac pacemaker, parathyroid  gland removal.   Clinical Impression  Pt admitted with above diagnosis. Pt currently with functional limitations due to the deficits listed below (see PT Problem List). At the time of PT eval pt was able to perform transfers with up to min assist and RW for support. Pt appears confused at times, refusing to attempt to use the [facility] however urinating on the floor instead. She was aware that she was voiding but refusing to attempt to make it to the commode. Noted poor sitting balance, especially with attempts to weight shift laterally. Anticipate pt will require post-acute rehab <3 hours/day to maximize functional independence and safety and facilitate return to ALF. Pt will benefit from acute skilled PT to increase their independence and safety with mobility to allow discharge.           If plan is discharge home, recommend the following: A lot of help with walking and/or transfers;A lot of help with bathing/dressing/bathroom;Assistance with cooking/housework;Assist for transportation;Help with stairs or ramp for entrance;Supervision due to cognitive status   Can travel by private vehicle   No    Equipment Recommendations Rolling walker (2 wheels) (TBD by next venur of care)  Recommendations for Other Services       Functional Status Assessment Patient has had a recent decline in their functional status and demonstrates the ability to make significant improvements in function in a reasonable and predictable amount of time.     Precautions /  Restrictions Precautions Precautions: Fall Recall of Precautions/Restrictions: Impaired Precaution/Restrictions Comments: Monitor for orthostatics? Restrictions Weight Bearing Restrictions Per Provider Order: No      Mobility  Bed Mobility Overal bed mobility: Needs Assistance Bed Mobility: Rolling, Sidelying to Sit, Sit to Supine Rolling: Supervision Sidelying to sit: Supervision   Sit to supine: Supervision   General bed mobility comments: Increased time and effort but able to complete without assistance. Heavy use of rails required.    Transfers Overall transfer level: Needs assistance Equipment used: Rolling walker (2 wheels) Transfers: Sit to/from Stand Sit to Stand: Min assist           General transfer comment: Assist to power up to full stand. VC's for hand placement on seated surface for safety. Pt with urinary incontinence upon stand, refusing to try and transfer to the [facility] and urinated on the floor. Mobility was limited in order to complete hygiene and linen change.    Ambulation/Gait               General Gait Details: Unable to progress to gait training at this time.  Stairs            Wheelchair Mobility     Tilt Bed    Modified Rankin (Stroke Patients Only)       Balance Overall balance assessment: Needs assistance Sitting-balance support: Single extremity supported Sitting balance-Leahy Scale: Poor Sitting balance - Comments: Pt unable to shift weight without losing balance either to the R or L down onto forearm.   Standing balance support: No upper extremity supported, During functional activity, Reliant on assistive device for balance  Standing balance-Leahy Scale: Poor                               Pertinent Vitals/Pain Pain Assessment Pain Assessment: Faces Faces Pain Scale: Hurts little more Pain Location: Bottom Pain Descriptors / Indicators: Sore Pain Intervention(s): Limited activity within patient's  tolerance, Monitored during session, Repositioned    Home Living Family/patient expects to be discharged to:: Assisted living                 Home Equipment: Rollator (4 wheels) (Has an electric wheelchair ordered at [facility], needs to finish the training on it.) Additional Comments: [facility]    Prior Function Prior Level of Function : Needs assist             Mobility Comments: Uses the rollator all the time. Reports even with the rollator sometimes needs help getting around due to neuropathy. ADLs Comments: Supervision mostly with her showers, but someone is there if she needs assist. Needs assist with getting TED hose on but other than that can do her own dressing.     Extremity/Trunk Assessment   Upper Extremity Assessment Upper Extremity Assessment: RUE deficits/detail;LUE deficits/detail RUE Deficits / Details: Weakness and limited AROM in shoulders. Pt using L hand to raise RUE but able to attempt raising LUE without assist.    Lower Extremity Assessment Lower Extremity Assessment: Generalized weakness (4-/5 strength in quads, hamstrings, ankle DF, hip flexors bilaterally)    Cervical / Trunk Assessment Cervical / Trunk Assessment: Kyphotic  Communication   Communication Communication: No apparent difficulties    Cognition Arousal: Alert Behavior During Therapy: Flat affect   PT - Cognitive impairments: Awareness, Memory, Problem solving, Safety/Judgement                       PT - Cognition Comments: Daughter shaking head behind pt to confirm/deny some details of PLOF. Requires cues for completion of tasks and problem solving at times. Following commands: Intact       Cueing       General Comments      Exercises     Assessment/Plan    PT Assessment Patient needs continued PT services  PT Problem List Decreased strength;Decreased range of motion;Decreased activity tolerance;Decreased balance;Decreased mobility;Decreased  knowledge of use of DME;Decreased safety awareness;Decreased cognition;Decreased knowledge of precautions;Pain       PT Treatment Interventions DME instruction;Gait training;Functional mobility training;Therapeutic activities;Therapeutic exercise;Balance training;Patient/family education;Cognitive remediation    PT Goals (Current goals can be found in the Care Plan section)  Acute Rehab PT Goals Patient Stated Goal: Leave the hospital, return to her ALF room PT Goal Formulation: With patient/family Time For Goal Achievement: 10/04/23 Potential to Achieve Goals: Good    Frequency Min 2X/week     Co-evaluation               AM-PAC PT 6 Clicks Mobility  Outcome Measure Help needed turning from your back to your side while in a flat bed without using bedrails?: A Little Help needed moving from lying on your back to sitting on the side of a flat bed without using bedrails?: A Little Help needed moving to and from a bed to a chair (including a wheelchair)?: A Little Help needed standing up from a chair using your arms (e.g., wheelchair or bedside chair)?: A Little Help needed to walk in hospital room?: A Little Help needed climbing 3-5 steps with  a railing? : A Little 6 Click Score: 18    End of Session Equipment Utilized During Treatment: Gait belt Activity Tolerance: Patient tolerated treatment well Patient left: in bed;with call bell/phone within reach;with bed alarm set;with family/visitor present Nurse Communication: Mobility status PT Visit Diagnosis: Unsteadiness on feet (R26.81);Difficulty in walking, not elsewhere classified (R26.2)    Time: 9061-8980 PT Time Calculation (min) (ACUTE ONLY): 41 min   Charges:   PT Evaluation $PT Eval Moderate Complexity: 1 Mod PT Treatments $Gait Training: 8-22 mins $Therapeutic Activity: 8-22 mins PT General Charges $$ ACUTE PT VISIT: 1 Visit         Leita Sable, PT, DPT Acute Rehabilitation Services Secure Chat  Preferred Office: [phone]   Leita JONETTA Sable 09/27/2023, 1:00 PM

## 2023-09-27 NOTE — Discharge Summary (Addendum)
 Physician Discharge Summary   Patient: [name] MRN: [id number] DOB: May 08, 1936  Admit date:     09/25/2023  Discharge date: 09/27/23  Discharge Physician: Feliciana Horn   PCP: Marguerite Shiley, MD   Recommendations at discharge:  Please follow up with PCP on Monday,  Please follow up with cbc and bmp on Monday.   Discharge Diagnoses: Principal Problem:   AKI (acute kidney injury) [facility])    Hospital Course: [name] is a 88 y.o. female with medical history significant of HTN, MVP, PAF, CHB s/p pacemaker, neuropathy, encephalopathy, epilepsy, iron  deficiency anemia, was brought in from [facility], for generalized weakness, low grade fever, cold and nasal congestion for one week.  WORK UP revealed AKI.   Assessment and Plan:    Acute kidney injury:  Suspect from poor oral intake/ dehydration US  kidneys are negative for hydronephrosis.  Hydrated with IV fluids, and repeat renal parameters show improvement.  Holding valsartan  for now.  UA is abnormal,  Urine cultures done and pending.  She was started on ceftriaxone , transitioned to oral meds on discharge.  Please follow up with urine cultures with PCP.        Hypertension:  Well controlled. Avoid hypotension.  Holding hydralazine ,valsartan , and torsemide . Recheck renal parameters on Monday and restart meds slowly. Continue norvasc .      Seizure disorder Resume phenobarbital  and dilantin .      CHB S/P PPM PAF Rate controlled.      Mild anemia Monitor hemoglobin.    Mildly elevated troponin Suspect from demand ischemia from AKI and dehydration.  EKG does not show any ischemic changes.   Left sided pneumonia. Start the patient on levaquin  for 4 days.  She remains on RA.      Estimated body mass index is 19.37 kg/m as calculated from the following:   Height as of this encounter: 5\' 5"  (1.651 m).   Weight as of this encounter: 52.8 kg.        Consultants: none.  Procedures performed:  none.   Disposition: Skilled nursing facility Diet recommendation:  Regular diet DISCHARGE MEDICATION: Allergies as of 09/27/2023       Reactions   Aleve [naproxen] Other (See Comments)   Azithromycin Other (See Comments)   Reaction not recalled   Penicillins Hives, Other (See Comments)   Bad reaction as a child after a shot of Penicillin   Pravastatin Other (See Comments)   Reaction not recalled   Sulfa Antibiotics Other (See Comments)   Sulfa Drugs Cross Reactors Other (See Comments)   Reaction not recalled- stated she can take it "in small doses"   Tape Itching, Other (See Comments)   Cannot wear for an extended period of time   Naproxen Sodium Rash        Medication List     PAUSE taking these medications    valsartan  160 MG tablet Wait to take this until: September 30, 2023 Commonly known as: Diovan  Take 1 tablet (160 mg total) by mouth 2 (two) times daily.       STOP taking these medications    dextromethorphan-guaiFENesin  10-100 MG/5ML liquid Commonly known as: ROBITUSSIN-DM Replaced by: guaiFENesin -dextromethorphan 100-10 MG/5ML syrup   doxycycline  100 MG capsule Commonly known as: MONODOX    doxycycline  100 MG tablet Commonly known as: VIBRA -TABS   hydrALAZINE  50 MG tablet Commonly known as: APRESOLINE    predniSONE  20 MG tablet Commonly known as: DELTASONE        TAKE these medications    acetaminophen   500 MG tablet Commonly known as: TYLENOL  Take 1 tablet (500 mg total) by mouth every 6 (six) hours as needed for mild pain (pain score 1-3) or headache.   amLODipine  5 MG tablet Commonly known as: NORVASC  Take 1 tablet (5 mg total) by mouth daily.   b complex vitamins capsule Take 1 capsule by mouth daily.   cetirizine  10 MG tablet Commonly known as: ZyrTEC  Allergy Take 1 tablet (10 mg total) by mouth at bedtime for 14 days.   fluticasone  50 MCG/ACT nasal spray Commonly known as: FLONASE  Place 1-2 sprays into both nostrils daily.    guaiFENesin -dextromethorphan 100-10 MG/5ML syrup Commonly known as: ROBITUSSIN DM Take 10 mLs by mouth every 6 (six) hours as needed for cough. Replaces: dextromethorphan-guaiFENesin  10-100 MG/5ML liquid   ipratropium-albuterol  0.5-2.5 (3) MG/3ML Soln Commonly known as: DUONEB Take 3 mLs by nebulization every 6 (six) hours as needed (SOB/Wheezing).   Iron  (Ferrous Sulfate ) 325 (65 Fe) MG Tabs Take 325 mg by mouth 3 (three) times a week.   levofloxacin  250 MG tablet Commonly known as: Levaquin  Take 2 tablets (500 mg total) by mouth daily for 4 days.   magnesium  oxide 400 (240 Mg) MG tablet Commonly known as: MAG-OX Take 400 mg by mouth 2 (two) times daily.   ondansetron  4 MG disintegrating tablet Commonly known as: ZOFRAN -ODT Take 4 mg by mouth every 4 (four) hours as needed for nausea or vomiting.   PHENobarbital  64.8 MG tablet Commonly known as: LUMINAL Take 1 tablet (64.8 mg total) by mouth daily. Take 1 tablet every morning   phenytoin  100 MG ER capsule Commonly known as: DILANTIN  Take 200 mg by mouth daily.   Systane 0.4-0.3 % Soln Generic drug: Polyethyl Glycol-Propyl Glycol Place 1 drop into both eyes in the morning and at bedtime.   torsemide  20 MG tablet Commonly known as: DEMADEX  Take 1 tablet (20 mg total) by mouth 3 (three) times a week. Start taking on: September 30, 2023   triamcinolone  cream 0.1 % Commonly known as: KENALOG  Apply 1 Application topically 2 (two) times daily as needed.               Discharge Care Instructions  (From admission, onward)           Start     Ordered   09/27/23 0000  Discharge wound care:       Comments: Local wound care.   09/27/23 1552            Discharge Exam: Filed Weights   09/25/23 1449  Weight: 52.8 kg   General exam: Appears calm and comfortable  Respiratory system: Clear to auscultation. Respiratory effort normal. Cardiovascular system: S1 & S2 heard, RRR. No JVD, Gastrointestinal system:  Abdomen is nondistended, soft and nontender.  Central nervous system: Alert and oriented. No focal neurological deficits. Extremities: Symmetric 5 x 5 power. Skin: No rashes,  Psychiatry:  Mood & affect appropriate.    Condition at discharge: fair  The results of significant diagnostics from this hospitalization (including imaging, microbiology, ancillary and laboratory) are listed below for reference.   Imaging Studies: DG CHEST PORT 1 VIEW Result Date: 09/27/2023 CLINICAL DATA:  10031 Cough 10031. EXAM: PORTABLE CHEST 1 VIEW COMPARISON:  04/04/2023. FINDINGS: Bilateral lungs appear hyperlucent with coarse bronchovascular markings, in keeping with COPD. Re-demonstration of left retrocardiac airspace opacity obscuring the left hemidiaphragm and blunting the left lateral costophrenic angle, suggesting combination of left lung atelectasis and/or consolidation with pleural effusion. No significant interval change  since the prior study. Bilateral lungs otherwise appear clear. No dense consolidation or lung collapse. Bilateral costophrenic angles are clear. Stable cardio-mediastinal silhouette. There is a left sided 2-lead pacemaker. No acute osseous abnormalities. The soft tissues are within normal limits. IMPRESSION: No significant interval change since the prior study. Persistent left retrocardiac opacity, as described above. Electronically Signed   By: Beula Brunswick M.D.   On: 09/27/2023 15:22   US  Renal Result Date: 09/25/2023 CLINICAL DATA:  Acute kidney injury EXAM: RENAL / URINARY TRACT ULTRASOUND COMPLETE COMPARISON:  04/22/2022 FINDINGS: Right Kidney: Renal measurements: 8.7 x 4.2 x 4.7 cm = volume: 90 mL. 1.9 cm midpole cyst which appears simple. No follow-up imaging recommended. Cortical thinning. Normal echotexture. No hydronephrosis. Left Kidney: Renal measurements: 9.6 x 5.3 x 5.4 cm = volume: 46 mL. Cortical thinning. Normal echotexture. No mass or hydronephrosis. Bladder: Decompressed  Other: None. IMPRESSION: No acute findings.  No hydronephrosis. Electronically Signed   By: Janeece Mechanic M.D.   On: 09/25/2023 22:06    Microbiology: Results for orders placed or performed during the hospital encounter of 09/25/23  Urine Culture (for pregnant, neutropenic or urologic patients or patients with an indwelling urinary catheter)     Status: None (Preliminary result)   Collection Time: 09/25/23  4:38 PM   Specimen: Urine, Clean Catch  Result Value Ref Range Status   Specimen Description URINE, CLEAN CATCH  Final   Special Requests NONE  Final   Culture   Final    CULTURE REINCUBATED FOR BETTER GROWTH Performed at [facility], [address] [place], [place] [address]    Report Status PENDING  Incomplete    Labs: CBC: Recent Labs  Lab 09/25/23 1245 09/25/23 2354 09/26/23 0448  WBC 10.2 9.0 9.4  NEUTROABS  --   --  7.2  HGB 11.0* 10.8* 10.7*  HCT 32.6* 32.1* 30.8*  MCV 97.6 97.3 96.9  PLT 262 188 264   Basic Metabolic Panel: Recent Labs  Lab 09/25/23 1245 09/26/23 0448 09/27/23 0508  NA 134* 136 138  K 3.8 3.8 3.9  CL 98 102 107  CO2 22 21* 22  GLUCOSE 114* 91 90  BUN 119* 105* 86*  CREATININE 2.14* 1.72* 1.44*  CALCIUM  8.9 8.8* 8.6*   Liver Function Tests: No results for input(s): "AST", "ALT", "ALKPHOS", "BILITOT", "PROT", "ALBUMIN" in the last 168 hours. CBG: Recent Labs  Lab 09/25/23 1204  GLUCAP 109*    Discharge time spent: 36 minutes.   Signed: Feliciana Horn, MD [facility] 09/27/2023

## 2023-09-27 NOTE — Progress Notes (Signed)
   09/27/23 0854  TOC Brief Assessment  Insurance and Status Reviewed (Medicare A and B)  Patient has primary care physician Yes Joanell Mowers MD)  Home environment has been reviewed From [facility]  Prior level of function: Independent  Prior/Current Home Services No current home services  Social Drivers of Health Review SDOH reviewed no interventions necessary (Former smoker - Medium risk)  Readmission risk has been reviewed Yes (18%)  Transition of care needs no transition of care needs at this time   Patient presents with generalized weakness, low grade fever, cold and nasal congestion for one week. Abnormal labs reveal AKI   PT and OT to evaluate for needs. Please place [facility] consult if needed

## 2023-09-27 NOTE — TOC Initial Note (Signed)
 Transition of Care [facility]) - Initial/Assessment Note    Patient Details  Name: [name] MRN: [id number] Date of Birth: 1936/05/01  [facility] CM/SW Contact:    Valery Gaucher, LCSW Phone Number: 09/27/2023, 3:02 PM  Clinical Narrative:                  CSW met with patient and her daughter. CSW introduced self and explained role. Patient confirmed she arrived from [facility] and confirmed the plan is to return. Patient is agreeable to returning to short term rehab at [facility]. All questions answered.  Liddie Reel, MSW, LCSW Clinical Social Worker    Expected Discharge Plan: Skilled Nursing Facility Barriers to Discharge: Barriers Resolved   Patient Goals and CMS Choice            Expected Discharge Plan and Services In-house Referral: Clinical Social Work     Living arrangements for the past 2 months: Assisted Living Facility                                      Prior Living Arrangements/Services Living arrangements for the past 2 months: Assisted Living Facility Lives with:: Facility Resident          Need for Family Participation in Patient Care: Yes (Comment) Care giver support system in place?: Yes (comment)   Criminal Activity/Legal Involvement Pertinent to Current Situation/Hospitalization: No - Comment as needed  Activities of Daily Living   ADL Screening (condition at time of admission) Independently performs ADLs?: No Does the patient have a NEW difficulty with bathing/dressing/toileting/self-feeding that is expected to last >3 days?: No Does the patient have a NEW difficulty with getting in/out of bed, walking, or climbing stairs that is expected to last >3 days?: No Does the patient have a NEW difficulty with communication that is expected to last >3 days?: No Is the patient deaf or have difficulty hearing?: No Does the patient have difficulty seeing, even when wearing glasses/contacts?: No Does the patient have  difficulty concentrating, remembering, or making decisions?: No  Permission Sought/Granted Permission sought to share information with : Family Supports Permission granted to share information with : Yes, Verbal Permission Granted  Share Information with NAME: [place] Barbara  Permission granted to share info w AGENCY: [facility]  Permission granted to share info w Relationship: daughter  Permission granted to share info w Contact Information: [phone]  Emotional Assessment Appearance:: Appears stated age Attitude/Demeanor/Rapport: Engaged Affect (typically observed): Accepting, Appropriate Orientation: : Oriented to Self, Oriented to Place, Oriented to  Time, Oriented to Situation Alcohol  / Substance Use: Not Applicable Psych Involvement: No (comment)  Admission diagnosis:  Elevated BUN [R79.9] AKI (acute kidney injury) (HCC) [N17.9] Patient Active Problem List   Diagnosis Date Noted   AKI (acute kidney injury) (HCC) 09/25/2023   Cholecystitis with cholelithiasis 04/24/2022   Acute cholecystitis 04/22/2022   Hyperglycemia 04/22/2022   Hypocalcemia 04/22/2022   Neuropathy 04/05/2022   Pericardial effusion without cardiac tamponade 01/28/2022   Acute pericarditis    Pacemaker    Heart block AV complete (HCC)    Paroxysmal atrial fibrillation (HCC)    Aortic atherosclerosis (HCC)    Chest pain 01/27/2022   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   Syncope 10/15/2021   Hypertension    Cerebral AV malformation    Dyslipidemia    Acute kidney injury superimposed on chronic kidney disease (HCC)  Elevated LFTs 05/24/2021   Iron  deficiency anemia 05/24/2021   Macrocytic anemia 05/17/2021   Hypokalemia 04/11/2021   Pressure injury of skin 07/22/2020   Hyponatremia 07/21/2020   Mixed hyperlipidemia 07/21/2020   Acute metabolic encephalopathy 07/21/2020   History of complex partial epilepsy 07/21/2020   Hypomagnesemia 07/21/2020   Hypokalemia due to excessive gastrointestinal loss of  potassium 07/21/2020   Diarrhea 07/21/2020   Partial symptomatic epilepsy with complex partial seizures, not intractable, without status epilepticus (HCC) 07/28/2014   Spinal stenosis of lumbar region 07/28/2014   Abnormality of gait 11/24/2013   Low back pain 11/24/2013   Essential hypertension    Hypercholesterolemia    Carotid arterial disease (HCC)    MVP (mitral valve prolapse)    Abnormal cardiovascular stress test    PCP:  Marguerite Shiley, MD Pharmacy:   [facility] - [place], [place] - [phone] [place] [address] Phone: [phone] Fax: [phone]     Social Drivers of Health (SDOH) Social History: SDOH Screenings   Food Insecurity: No Food Insecurity (09/26/2023)  Housing: Low Risk  (09/26/2023)  Transportation Needs: No Transportation Needs (09/26/2023)  Utilities: Not At Risk (09/26/2023)  Alcohol  Screen: Low Risk  (08/05/2023)  Depression (PHQ2-9): Low Risk  (08/13/2023)  Social Connections: Moderately Integrated (09/26/2023)  Tobacco Use: Medium Risk (09/25/2023)   SDOH Interventions:     Readmission Risk Interventions     No data to display

## 2023-09-27 NOTE — TOC Transition Note (Signed)
 Transition of Care [facility]) - Discharge Note   Patient Details  Name: [name] MRN: [id number] Date of Birth: 1936/06/10  Transition of Care [facility]) CM/SW Contact:  Valery Gaucher, LCSW Phone Number: 09/27/2023, 3:54 PM   Clinical Narrative:     Patient will Discharge to: [facility]/SNF Discharge Date: 09/27/2023 Family Notified: daughter Transport JX:[id number]   Per MD patient is ready for discharge. RN, patient, and facility notified of discharge. Discharge Summary sent to facility. RN given number for [phone]. Ambulance transport requested for patient.   Clinical Social Worker signing off.  Liddie Reel, MSW, LCSW Clinical Social Worker    Final next level of care: Skilled Nursing Facility Barriers to Discharge: Barriers Resolved   Patient Goals and CMS Choice            Discharge Placement                       Discharge Plan and Services Additional resources added to the After Visit Summary for   In-house Referral: Clinical Social Work                                   Social Drivers of Health (SDOH) Interventions SDOH Screenings   Food Insecurity: No Food Insecurity (09/26/2023)  Housing: Low Risk  (09/26/2023)  Transportation Needs: No Transportation Needs (09/26/2023)  Utilities: Not At Risk (09/26/2023)  Alcohol  Screen: Low Risk  (08/05/2023)  Depression (PHQ2-9): Low Risk  (08/13/2023)  Social Connections: Moderately Integrated (09/26/2023)  Tobacco Use: Medium Risk (09/25/2023)     Readmission Risk Interventions     No data to display

## 2023-09-30 ENCOUNTER — Non-Acute Institutional Stay (SKILLED_NURSING_FACILITY): Payer: Self-pay | Admitting: Internal Medicine

## 2023-09-30 ENCOUNTER — Encounter: Payer: Self-pay | Admitting: Internal Medicine

## 2023-09-30 DIAGNOSIS — I480 Paroxysmal atrial fibrillation: Secondary | ICD-10-CM

## 2023-09-30 DIAGNOSIS — I10 Essential (primary) hypertension: Secondary | ICD-10-CM | POA: Diagnosis not present

## 2023-09-30 DIAGNOSIS — D631 Anemia in chronic kidney disease: Secondary | ICD-10-CM | POA: Diagnosis not present

## 2023-09-30 DIAGNOSIS — N1831 Chronic kidney disease, stage 3a: Secondary | ICD-10-CM

## 2023-09-30 DIAGNOSIS — N179 Acute kidney failure, unspecified: Secondary | ICD-10-CM | POA: Diagnosis not present

## 2023-09-30 DIAGNOSIS — R531 Weakness: Secondary | ICD-10-CM

## 2023-09-30 DIAGNOSIS — E860 Dehydration: Secondary | ICD-10-CM | POA: Diagnosis not present

## 2023-09-30 DIAGNOSIS — I5032 Chronic diastolic (congestive) heart failure: Secondary | ICD-10-CM

## 2023-09-30 LAB — URINE CULTURE

## 2023-09-30 LAB — CBC: RBC: 2.81 — AB (ref 3.87–5.11)

## 2023-09-30 LAB — BASIC METABOLIC PANEL WITH GFR
BUN: 47 — AB (ref 4–21)
CO2: 24 — AB (ref 13–22)
Chloride: 106 (ref 99–108)
Creatinine: 1.2 — AB (ref 0.5–1.1)
Creatinine: 1.9 — AB (ref 0.5–1.1)
Glucose: 86
Potassium: 4.6 meq/L (ref 3.5–5.1)
Sodium: 139 (ref 137–147)

## 2023-09-30 LAB — CBC AND DIFFERENTIAL
HCT: 28 — AB (ref 36–46)
Hemoglobin: 9.4 — AB (ref 12.0–16.0)
Platelets: 245 10*3/uL (ref 150–400)
WBC: 7.7

## 2023-09-30 LAB — COMPREHENSIVE METABOLIC PANEL WITH GFR
Calcium: 8.9 (ref 8.7–10.7)
eGFR: 45

## 2023-09-30 NOTE — Progress Notes (Signed)
 Location: Medical illustrator of Service:  SNF (31)  Provider:   Code Status: DNR Goals of Care:     09/26/2023    2:48 PM  Advanced Directives  Would patient like information on creating a medical advance directive? No - Patient declined     Chief Complaint  Patient presents with   Acute Visit    HPI: Patient is a 88 y.o. female seen today for an acute visit for Follow up from the Office She is in [place] in [place]   Patient was having cough and fever for few days.  It was nonproductive cough.  She was COVID and flu negative Chest x-ray showed possible mild left effusion and possible CHF.  She was treated with doxycycline  for pneumonia and was also started on prednisone  Then her labs came back and had creatinine was up to 2.4.BUN was 106  She was sent to ED.  She was admitted.  Her ultrasound renal ultrasound did not show any obstruction But Torsemide   was stopped.  She was gently Hydrated She was also treated with antibiotics which were changed to Levaquin  Had this x-ray showed persistent left retrocardiac opacity questionable consolidation are atelectasis  She is back in Rehab Just feels weak other wise back to Normal  Denies cough fever or SOB  Patient has  Has history of chronic diarrhea and Previous h/o C Diff colitis S/p PPM placement on 05/30 for CHB  A Fib Amiodarone  tried but then taken off due to GI side effects Not on anticoagulation due to cerebral AVMs.  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 teen Had a work-up at [facility] and has been on Dilantin  since then History of unhealed right humerus shaft fracture Peripheral neuropathy   Past Medical History:  Diagnosis Date   Abnormal cardiovascular stress test    Carotid arterial disease (HCC)    MODERATE RIGHT   Cerebral AV malformation    DDD (degenerative disc disease)    SEVERE WITH SPINAL STENOSIS    Hypercholesterolemia    Hypertension    MVP (mitral valve prolapse)    MILD   Numbness    LEFT FOOT AND ANKLE   Osteopenia    Presence of permanent cardiac pacemaker    Pseudo-gout     Past Surgical History:  Procedure Laterality Date   APPENDECTOMY     BREAST MASS EXCISION     CARPAL TUNNEL RELEASE     CHOLECYSTECTOMY N/A 04/23/2022   Procedure: LAPAROSCOPIC CHOLECYSTECTOMY;  Surgeon: Oza Blumenthal, MD;  Location: [facility] ORS;  Service: General;  Laterality: N/A;   gallbladder     PACEMAKER IMPLANT N/A 11/07/2021   Procedure: PACEMAKER IMPLANT;  Surgeon: Tammie Fall, MD;  Location: [facility] INVASIVE CV LAB;  Service: Cardiovascular;  Laterality: N/A;   REMOVAL OF PARATHYROID  GLAND     STRESS NUCLEAR STUDY     ABNORMAL   TONSILLECTOMY      Allergies  Allergen Reactions   Aleve [Naproxen] Other (See Comments)   Azithromycin Other (See Comments)    Reaction not recalled   Penicillins Hives and Other (See Comments)    Bad reaction as a child after a shot of Penicillin   Pravastatin Other (See Comments)    Reaction not recalled   Sulfa Antibiotics Other (See Comments)   Sulfa Drugs Cross Reactors Other (See Comments)    Reaction not recalled- stated she can take it "  in small doses"   Tape Itching and Other (See Comments)    Cannot wear for an extended period of time   Naproxen Sodium Rash    Outpatient Encounter Medications as of 09/30/2023  Medication Sig   acetaminophen  (TYLENOL ) 500 MG tablet Take 1 tablet (500 mg total) by mouth every 6 (six) hours as needed for mild pain (pain score 1-3) or headache.   amLODipine  (NORVASC ) 5 MG tablet Take 1 tablet (5 mg total) by mouth daily.   b complex vitamins capsule Take 1 capsule by mouth daily.   cetirizine  (ZYRTEC  ALLERGY) 10 MG tablet Take 1 tablet (10 mg total) by mouth at bedtime for 14 days.   fluticasone  (FLONASE ) 50 MCG/ACT nasal spray Place 1-2 sprays into both nostrils daily.   guaiFENesin -dextromethorphan (ROBITUSSIN  DM) 100-10 MG/5ML syrup Take 10 mLs by mouth every 6 (six) hours as needed for cough.   ipratropium-albuterol  (DUONEB) 0.5-2.5 (3) MG/3ML SOLN Take 3 mLs by nebulization every 6 (six) hours as needed (SOB/Wheezing).   Iron , Ferrous Sulfate , 325 (65 Fe) MG TABS Take 325 mg by mouth 3 (three) times a week.   levofloxacin  (LEVAQUIN ) 250 MG tablet Take 2 tablets (500 mg total) by mouth daily for 4 days.   magnesium  oxide (MAG-OX) 400 (240 Mg) MG tablet Take 400 mg by mouth 2 (two) times daily.   ondansetron  (ZOFRAN -ODT) 4 MG disintegrating tablet Take 4 mg by mouth every 4 (four) hours as needed for nausea or vomiting.   PHENobarbital  (LUMINAL) 64.8 MG tablet Take 1 tablet (64.8 mg total) by mouth daily. Take 1 tablet every morning   phenytoin  (DILANTIN ) 100 MG ER capsule Take 200 mg by mouth daily.   Polyethyl Glycol-Propyl Glycol (SYSTANE) 0.4-0.3 % SOLN Place 1 drop into both eyes in the morning and at bedtime.   torsemide  (DEMADEX ) 20 MG tablet Take 1 tablet (20 mg total) by mouth 3 (three) times a week.   triamcinolone  cream (KENALOG ) 0.1 % Apply 1 Application topically 2 (two) times daily as needed.   valsartan  (DIOVAN ) 160 MG tablet Take 1 tablet (160 mg total) by mouth 2 (two) times daily.   No facility-administered encounter medications on file as of 09/30/2023.    Review of Systems:  Review of Systems  Constitutional:  Positive for activity change. Negative for appetite change.  HENT: Negative.    Respiratory:  Negative for cough and shortness of breath.   Cardiovascular:  Negative for leg swelling.  Gastrointestinal:  Negative for constipation.  Genitourinary: Negative.   Musculoskeletal:  Positive for gait problem. Negative for arthralgias and myalgias.  Skin: Negative.   Neurological:  Positive for weakness. Negative for dizziness.  Psychiatric/Behavioral:  Negative for confusion, dysphoric mood and sleep disturbance.     Health Maintenance  Topic Date Due   DEXA SCAN  Never  done   COVID-19 Vaccine (7 - 2024-25 season) 05/28/2023   INFLUENZA VACCINE  01/10/2024   Medicare Annual Wellness (AWV)  08/04/2024   Pneumonia Vaccine 25+ Years old  Completed   Zoster Vaccines- Shingrix  Completed   HPV VACCINES  Aged Out   Meningococcal B Vaccine  Aged Out   DTaP/Tdap/Td  Discontinued    Physical Exam: Vitals:   09/30/23 2114  BP: 136/60  Pulse: 60  Resp: 16  Temp: 98.3 F (36.8 C)  Weight: 114 lb (51.7 kg)   Body mass index is 18.97 kg/m. Physical Exam Vitals reviewed.  Constitutional:      Appearance: Normal appearance.  HENT:  Head: Normocephalic.     Nose: Nose normal.     Mouth/Throat:     Mouth: Mucous membranes are moist.     Pharynx: Oropharynx is clear.  Eyes:     Pupils: Pupils are equal, round, and reactive to light.  Cardiovascular:     Rate and Rhythm: Normal rate and regular rhythm.     Pulses: Normal pulses.     Heart sounds: Normal heart sounds. No murmur heard. Pulmonary:     Effort: Pulmonary effort is normal.     Breath sounds: Normal breath sounds.     Comments: Rales Present in Left Lower area Abdominal:     General: Abdomen is flat. Bowel sounds are normal.     Palpations: Abdomen is soft.  Musculoskeletal:        General: No swelling.     Cervical back: Neck supple.  Skin:    General: Skin is warm.  Neurological:     General: No focal deficit present.     Mental Status: She is alert and oriented to person, place, and time.  Psychiatric:        Mood and Affect: Mood normal.        Thought Content: Thought content normal.     Labs reviewed: Basic Metabolic Panel: Recent Labs    09/25/23 1245 09/26/23 0448 09/27/23 0508  NA 134* 136 138  K 3.8 3.8 3.9  CL 98 102 107  CO2 22 21* 22  GLUCOSE 114* 91 90  BUN 119* 105* 86*  CREATININE 2.14* 1.72* 1.44*  CALCIUM  8.9 8.8* 8.6*   Liver Function Tests: Recent Labs    06/13/23 0000  AST 30  ALT 21  ALKPHOS 74  ALBUMIN 3.5   No results for input(s):  "LIPASE", "AMYLASE" in the last 8760 hours. No results for input(s): "AMMONIA" in the last 8760 hours. CBC: Recent Labs    09/25/23 1245 09/25/23 2354 09/26/23 0448  WBC 10.2 9.0 9.4  NEUTROABS  --   --  7.2  HGB 11.0* 10.8* 10.7*  HCT 32.6* 32.1* 30.8*  MCV 97.6 97.3 96.9  PLT 262 188 264   Lipid Panel: Recent Labs    06/13/23 0000  HDL 97*  LDLCALC 97  TRIG 51   Lab Results  Component Value Date   HGBA1C 204 06/13/2023    Procedures since last visit: DG CHEST PORT 1 VIEW Result Date: 09/27/2023 CLINICAL DATA:  10031 Cough 10031. EXAM: PORTABLE CHEST 1 VIEW COMPARISON:  04/04/2023. FINDINGS: Bilateral lungs appear hyperlucent with coarse bronchovascular markings, in keeping with COPD. Re-demonstration of left retrocardiac airspace opacity obscuring the left hemidiaphragm and blunting the left lateral costophrenic angle, suggesting combination of left lung atelectasis and/or consolidation with pleural effusion. No significant interval change since the prior study. Bilateral lungs otherwise appear clear. No dense consolidation or lung collapse. Bilateral costophrenic angles are clear. Stable cardio-mediastinal silhouette. There is a left sided 2-lead pacemaker. No acute osseous abnormalities. The soft tissues are within normal limits. IMPRESSION: No significant interval change since the prior study. Persistent left retrocardiac opacity, as described above. Electronically Signed   By: Beula Brunswick M.D.   On: 09/27/2023 15:22   US  Renal Result Date: 09/25/2023 CLINICAL DATA:  Acute kidney injury EXAM: RENAL / URINARY TRACT ULTRASOUND COMPLETE COMPARISON:  04/22/2022 FINDINGS: Right Kidney: Renal measurements: 8.7 x 4.2 x 4.7 cm = volume: 90 mL. 1.9 cm midpole cyst which appears simple. No follow-up imaging recommended. Cortical thinning. Normal echotexture. No hydronephrosis. Left  Kidney: Renal measurements: 9.6 x 5.3 x 5.4 cm = volume: 46 mL. Cortical thinning. Normal echotexture. No  mass or hydronephrosis. Bladder: Decompressed Other: None. IMPRESSION: No acute findings.  No hydronephrosis. Electronically Signed   By: Janeece Mechanic M.D.   On: 09/25/2023 22:06    Assessment/Plan 1. AKI (acute kidney injury) (HCC) (Primary) Diuretics were held and she was rehydrated Repeat Labs are pending  2. Chronic diastolic congestive heart failure (HCC) Back on Torsemide   3. Primary hypertension Hydralazine  was stopped and now she is on Norvasc  and Valsartan   4. Anemia due to stage 3a chronic kidney disease (HCC) On iron  Repeat Labs are pending  5. Paroxysmal atrial fibrillation (HCC) No Anticoagulation due to AVMS Follows with EP  6. Hypomagnesemia Supplement  7. Weakness Will get therapy  8 Heart block AV complete (HCC) S/p PPM   9 Cerebral AV malformation Follows with Neurology     10 History of complex partial epilepsy On Phenobarb and  Dilantin      Labs/tests ordered:   Next appt:  11/18/2023

## 2023-10-01 DIAGNOSIS — R2689 Other abnormalities of gait and mobility: Secondary | ICD-10-CM | POA: Diagnosis not present

## 2023-10-01 DIAGNOSIS — M62562 Muscle wasting and atrophy, not elsewhere classified, left lower leg: Secondary | ICD-10-CM | POA: Diagnosis not present

## 2023-10-01 DIAGNOSIS — R278 Other lack of coordination: Secondary | ICD-10-CM | POA: Diagnosis not present

## 2023-10-01 DIAGNOSIS — G9332 Myalgic encephalomyelitis/chronic fatigue syndrome: Secondary | ICD-10-CM | POA: Diagnosis not present

## 2023-10-01 DIAGNOSIS — R531 Weakness: Secondary | ICD-10-CM | POA: Diagnosis not present

## 2023-10-01 DIAGNOSIS — M62561 Muscle wasting and atrophy, not elsewhere classified, right lower leg: Secondary | ICD-10-CM | POA: Diagnosis not present

## 2023-10-01 DIAGNOSIS — E871 Hypo-osmolality and hyponatremia: Secondary | ICD-10-CM | POA: Diagnosis not present

## 2023-10-02 DIAGNOSIS — R278 Other lack of coordination: Secondary | ICD-10-CM | POA: Diagnosis not present

## 2023-10-02 DIAGNOSIS — M62562 Muscle wasting and atrophy, not elsewhere classified, left lower leg: Secondary | ICD-10-CM | POA: Diagnosis not present

## 2023-10-02 DIAGNOSIS — R531 Weakness: Secondary | ICD-10-CM | POA: Diagnosis not present

## 2023-10-02 DIAGNOSIS — M62561 Muscle wasting and atrophy, not elsewhere classified, right lower leg: Secondary | ICD-10-CM | POA: Diagnosis not present

## 2023-10-02 DIAGNOSIS — G9332 Myalgic encephalomyelitis/chronic fatigue syndrome: Secondary | ICD-10-CM | POA: Diagnosis not present

## 2023-10-02 DIAGNOSIS — R2689 Other abnormalities of gait and mobility: Secondary | ICD-10-CM | POA: Diagnosis not present

## 2023-10-03 DIAGNOSIS — R531 Weakness: Secondary | ICD-10-CM | POA: Diagnosis not present

## 2023-10-03 DIAGNOSIS — M62562 Muscle wasting and atrophy, not elsewhere classified, left lower leg: Secondary | ICD-10-CM | POA: Diagnosis not present

## 2023-10-03 DIAGNOSIS — G9332 Myalgic encephalomyelitis/chronic fatigue syndrome: Secondary | ICD-10-CM | POA: Diagnosis not present

## 2023-10-03 DIAGNOSIS — M62561 Muscle wasting and atrophy, not elsewhere classified, right lower leg: Secondary | ICD-10-CM | POA: Diagnosis not present

## 2023-10-03 DIAGNOSIS — R2689 Other abnormalities of gait and mobility: Secondary | ICD-10-CM | POA: Diagnosis not present

## 2023-10-03 DIAGNOSIS — R278 Other lack of coordination: Secondary | ICD-10-CM | POA: Diagnosis not present

## 2023-10-04 DIAGNOSIS — R2689 Other abnormalities of gait and mobility: Secondary | ICD-10-CM | POA: Diagnosis not present

## 2023-10-04 DIAGNOSIS — M62561 Muscle wasting and atrophy, not elsewhere classified, right lower leg: Secondary | ICD-10-CM | POA: Diagnosis not present

## 2023-10-04 DIAGNOSIS — R531 Weakness: Secondary | ICD-10-CM | POA: Diagnosis not present

## 2023-10-04 DIAGNOSIS — G9332 Myalgic encephalomyelitis/chronic fatigue syndrome: Secondary | ICD-10-CM | POA: Diagnosis not present

## 2023-10-04 DIAGNOSIS — R278 Other lack of coordination: Secondary | ICD-10-CM | POA: Diagnosis not present

## 2023-10-04 DIAGNOSIS — M62562 Muscle wasting and atrophy, not elsewhere classified, left lower leg: Secondary | ICD-10-CM | POA: Diagnosis not present

## 2023-10-05 DIAGNOSIS — R278 Other lack of coordination: Secondary | ICD-10-CM | POA: Diagnosis not present

## 2023-10-05 DIAGNOSIS — R531 Weakness: Secondary | ICD-10-CM | POA: Diagnosis not present

## 2023-10-05 DIAGNOSIS — G9332 Myalgic encephalomyelitis/chronic fatigue syndrome: Secondary | ICD-10-CM | POA: Diagnosis not present

## 2023-10-05 DIAGNOSIS — M62562 Muscle wasting and atrophy, not elsewhere classified, left lower leg: Secondary | ICD-10-CM | POA: Diagnosis not present

## 2023-10-05 DIAGNOSIS — R2689 Other abnormalities of gait and mobility: Secondary | ICD-10-CM | POA: Diagnosis not present

## 2023-10-05 DIAGNOSIS — M62561 Muscle wasting and atrophy, not elsewhere classified, right lower leg: Secondary | ICD-10-CM | POA: Diagnosis not present

## 2023-10-07 ENCOUNTER — Encounter: Payer: Self-pay | Admitting: Adult Health

## 2023-10-07 ENCOUNTER — Non-Acute Institutional Stay (SKILLED_NURSING_FACILITY): Payer: Self-pay | Admitting: Adult Health

## 2023-10-07 DIAGNOSIS — I5032 Chronic diastolic (congestive) heart failure: Secondary | ICD-10-CM | POA: Diagnosis not present

## 2023-10-07 DIAGNOSIS — D539 Nutritional anemia, unspecified: Secondary | ICD-10-CM | POA: Diagnosis not present

## 2023-10-07 DIAGNOSIS — G629 Polyneuropathy, unspecified: Secondary | ICD-10-CM

## 2023-10-07 DIAGNOSIS — R269 Unspecified abnormalities of gait and mobility: Secondary | ICD-10-CM

## 2023-10-07 DIAGNOSIS — R2689 Other abnormalities of gait and mobility: Secondary | ICD-10-CM | POA: Diagnosis not present

## 2023-10-07 DIAGNOSIS — R278 Other lack of coordination: Secondary | ICD-10-CM | POA: Diagnosis not present

## 2023-10-07 DIAGNOSIS — I10 Essential (primary) hypertension: Secondary | ICD-10-CM | POA: Diagnosis not present

## 2023-10-07 DIAGNOSIS — J90 Pleural effusion, not elsewhere classified: Secondary | ICD-10-CM | POA: Diagnosis not present

## 2023-10-07 DIAGNOSIS — M62561 Muscle wasting and atrophy, not elsewhere classified, right lower leg: Secondary | ICD-10-CM | POA: Diagnosis not present

## 2023-10-07 DIAGNOSIS — R531 Weakness: Secondary | ICD-10-CM | POA: Diagnosis not present

## 2023-10-07 DIAGNOSIS — N1831 Chronic kidney disease, stage 3a: Secondary | ICD-10-CM

## 2023-10-07 DIAGNOSIS — G9332 Myalgic encephalomyelitis/chronic fatigue syndrome: Secondary | ICD-10-CM | POA: Diagnosis not present

## 2023-10-07 DIAGNOSIS — M62562 Muscle wasting and atrophy, not elsewhere classified, left lower leg: Secondary | ICD-10-CM | POA: Diagnosis not present

## 2023-10-07 NOTE — Progress Notes (Signed)
 Location:  [facility] Room Number: 155 A Place of Service:  SNF [phone]) Provider:  Raylene Calamity, NP    Patient Care Team: Marguerite Shiley, MD as PCP - General (Internal Medicine) [place], [name], MD as PCP - Cardiology (Cardiology) Jhonny Moss, MD as Consulting Physician (Neurology)  Extended Emergency Contact Information Primary Emergency Contact: [name],[name] Mobile Phone: [phone] Relation: Daughter Secondary Emergency Contact: KARISTA, ZANT Mobile Phone: [phone] Relation: Son  Code Status:  DNR Goals of care: Advanced Directive information    09/26/2023    2:48 PM  Advanced Directives  Would patient like information on creating a medical advance directive? No - Patient declined     Chief Complaint  Patient presents with   Acute Visit    Discharge     HPI:  Pt is a 88 y.o. female seen today for discharge from [facility].   Hospitalized 09/25/23-09/27/23 due to AKI (BUN 105 Cr 1.72) likely due to poor oral intake. US  of the kidneys was negative for hydronephrosis. She was gently hydrated and torsemide  held.  UA was abnormal. Rocephin  given and discharged on antibiotics. Final urine culture grew 40K of Citrobacter.  CXR showed left retrocardiac opacity effusion vs pna. She was discharged on Levaquin   Prior to the hospitalization she was treated for URI with doxycycline  and prednisone  Seen 4/15 and found to have elevated BNP not duplicated during her hospitalization. Was given extra torsemide .    Worked with therapy and made gains but continues to need assistance with ADLs and has fatigue. She is going to be working on using a Neurosurgeon.  No sob or cp. Weight stable No urinary symptoms. Feels ready to return to [facility] and continue therapy.  Main complaint is fatigue.   During her rehab stay valsartan  was restarted. Norvasc  was added back. Torsemide  on three times weekly. Has  chronic unchanged leg edema. Using compression hose.   PMH significant for syncope and pacemaker, chronic left effusion, CHF, anemia, cerebral AV malformation on seizures preventions meds , afib not on doac due to the above, off amiodarone  due to GI s/e, diarrhea, s/p cholecystectomy, neuropathy , prior pericarditis treated with colchicine   Past Medical History:  Diagnosis Date   Abnormal cardiovascular stress test    Carotid arterial disease (HCC)    MODERATE RIGHT   Cerebral AV malformation    DDD (degenerative disc disease)    SEVERE WITH SPINAL STENOSIS   Hypercholesterolemia    Hypertension    MVP (mitral valve prolapse)    MILD   Numbness    LEFT FOOT AND ANKLE   Osteopenia    Presence of permanent cardiac pacemaker    Pseudo-gout    Past Surgical History:  Procedure Laterality Date   APPENDECTOMY     BREAST MASS EXCISION     CARPAL TUNNEL RELEASE     CHOLECYSTECTOMY N/A 04/23/2022   Procedure: LAPAROSCOPIC CHOLECYSTECTOMY;  Surgeon: Oza Blumenthal, MD;  Location: [facility];  Service: General;  Laterality: N/A;   gallbladder     PACEMAKER IMPLANT N/A 11/07/2021   Procedure: PACEMAKER IMPLANT;  Surgeon: Tammie Fall, MD;  Location: [facility];  Service: Cardiovascular;  Laterality: N/A;   REMOVAL OF PARATHYROID  GLAND     STRESS NUCLEAR STUDY     ABNORMAL   TONSILLECTOMY      Allergies  Allergen Reactions   Aleve [Naproxen] Other (See Comments)   Azithromycin Other (See Comments)  Reaction not recalled   Penicillins Hives and Other (See Comments)    Bad reaction as a child after a shot of Penicillin   Pravastatin Other (See Comments)    Reaction not recalled   Sulfa Antibiotics Other (See Comments)   Sulfa Drugs Cross Reactors Other (See Comments)    Reaction not recalled- stated she can take it "in small doses"   Tape Itching and Other (See Comments)    Cannot wear for an extended period of time   Naproxen Sodium Rash    Outpatient Encounter  Medications as of 10/07/2023  Medication Sig   acetaminophen  (TYLENOL ) 500 MG tablet Take 1 tablet (500 mg total) by mouth every 6 (six) hours as needed for mild pain (pain score 1-3) or headache.   amLODipine  (NORVASC ) 5 MG tablet Take 1 tablet (5 mg total) by mouth daily.   b complex vitamins capsule Take 1 capsule by mouth daily.   fluticasone  (FLONASE ) 50 MCG/ACT nasal spray Place 1-2 sprays into both nostrils daily.   guaiFENesin -dextromethorphan (ROBITUSSIN DM) 100-10 MG/5ML syrup Take 10 mLs by mouth every 6 (six) hours as needed for cough.   ipratropium-albuterol  (DUONEB) 0.5-2.5 (3) MG/3ML SOLN Take 3 mLs by nebulization every 6 (six) hours as needed (SOB/Wheezing).   Iron , Ferrous Sulfate , 325 (65 Fe) MG TABS Take 325 mg by mouth 3 (three) times a week.   magnesium  oxide (MAG-OX) 400 (240 Mg) MG tablet Take 400 mg by mouth 2 (two) times daily.   ondansetron  (ZOFRAN -ODT) 4 MG disintegrating tablet Take 4 mg by mouth every 4 (four) hours as needed for nausea or vomiting.   PHENobarbital  (LUMINAL) 64.8 MG tablet Take 1 tablet (64.8 mg total) by mouth daily. Take 1 tablet every morning   phenytoin  (DILANTIN ) 100 MG ER capsule Take 200 mg by mouth daily.   Polyethyl Glycol-Propyl Glycol (SYSTANE) 0.4-0.3 % SOLN Place 1 drop into both eyes in the morning and at bedtime.   torsemide  (DEMADEX ) 20 MG tablet Take 1 tablet (20 mg total) by mouth 3 (three) times a week.   triamcinolone  cream (KENALOG ) 0.1 % Apply 1 Application topically 2 (two) times daily as needed.   valsartan  (DIOVAN ) 160 MG tablet Take 1 tablet (160 mg total) by mouth 2 (two) times daily.   cetirizine  (ZYRTEC  ALLERGY) 10 MG tablet Take 1 tablet (10 mg total) by mouth at bedtime for 14 days.   No facility-administered encounter medications on file as of 10/07/2023.    Review of Systems  Constitutional:  Positive for fatigue. Negative for activity change, appetite change, chills, diaphoresis, fever and unexpected weight change.   HENT:  Negative for congestion.   Respiratory:  Negative for cough, shortness of breath and wheezing.   Cardiovascular:  Positive for leg swelling. Negative for chest pain and palpitations.  Gastrointestinal:  Negative for abdominal distention, abdominal pain, constipation and diarrhea.  Genitourinary:  Negative for difficulty urinating and dysuria.  Musculoskeletal:  Positive for arthralgias and gait problem. Negative for back pain, joint swelling and myalgias.  Neurological:  Positive for weakness. Negative for dizziness, tremors, seizures, syncope, facial asymmetry, speech difficulty, light-headedness, numbness and headaches.  Psychiatric/Behavioral:  Negative for agitation, behavioral problems and confusion.     Immunization History  Administered Date(s) Administered   Fluad Quad(high Dose 65+) 04/16/2022   Fluad Trivalent(High Dose 65+) 04/02/2023   Fluzone Influenza virus vaccine,trivalent (IIV3), split virus 03/17/2010, 03/26/2011, 04/01/2012, 03/05/2013   Influenza Split 03/28/2009   Influenza, Quadrivalent, Recombinant, Inj, Pf 03/08/2018, 02/13/2019, 02/24/2020  Influenza,inj,Quad PF,6+ Mos 02/25/2014, 03/05/2015   Influenza-Unspecified 02/09/2014, 02/25/2016, 04/06/2017, 04/25/2021   Moderna Covid-19 Vaccine  Bivalent Booster 73yrs & up 04/02/2023   Moderna Sars-Covid-2 Vaccination 07/17/2019, 08/14/2019, 02/24/2020, 04/26/2020   Pneumococcal Conjugate-13 11/26/2014   Pneumococcal Polysaccharide-23 06/23/2003, 11/10/2012   Pneumococcal-Unspecified 06/23/2003, 11/10/2012   Td (Adult), 2 Lf Tetanus Toxid, Preservative Free 06/11/2000, 11/06/2011   Td (Adult),5 Lf Tetanus Toxid, Preservative Free 06/11/2000, 11/06/2011   Unspecified SARS-COV-2 Vaccination 04/28/2021   Zoster Recombinant(Shingrix) 01/09/2019, 05/08/2019   Zoster, Live 01/15/2006, 01/15/2019   Pertinent  Health Maintenance Due  Topic Date Due   DEXA SCAN  Never done   INFLUENZA VACCINE  01/10/2024       04/08/2023    1:34 PM 06/18/2023    1:50 PM 07/23/2023    9:49 AM 08/05/2023    3:34 PM 08/13/2023   10:30 AM  Fall Risk  Falls in the past year? 0 0 0 0 0  Was there an injury with Fall? 0 0 0 0 0  Fall Risk Category Calculator 0 0 0 0 0  Patient at Risk for Falls Due to  No Fall Risks  No Fall Risks Impaired balance/gait;Impaired mobility  Fall risk Follow up Falls evaluation completed Falls evaluation completed Falls evaluation completed Falls evaluation completed Falls evaluation completed   Functional Status Survey:    Vitals:   10/07/23 1044  BP: (!) 143/53  Pulse: 62  Resp: 16  Temp: 98.1 F (36.7 C)  SpO2: 94%  Weight: 118 lb (53.5 kg)  Height: 5\' 5"  (1.651 m)   Body mass index is 19.64 kg/m. Physical Exam Vitals and nursing note reviewed.  Constitutional:      General: She is not in acute distress.    Appearance: Normal appearance. She is not diaphoretic.  HENT:     Head: Normocephalic and atraumatic.     Nose: No congestion.  Neck:     Thyroid : No thyromegaly.     Vascular: No carotid bruit or JVD.  Cardiovascular:     Rate and Rhythm: Normal rate and regular rhythm.     Heart sounds: Normal heart sounds. No murmur heard. Pulmonary:     Effort: Pulmonary effort is normal. No respiratory distress.     Breath sounds: Normal breath sounds. No stridor.  Abdominal:     General: Bowel sounds are normal. There is no distension.     Palpations: Abdomen is soft.     Tenderness: There is no abdominal tenderness.  Musculoskeletal:     Cervical back: No rigidity. No muscular tenderness.     Right lower leg: Edema (+1) present.     Left lower leg: Edema (+1) present.  Lymphadenopathy:     Cervical: No cervical adenopathy.  Skin:    General: Skin is warm and dry.  Neurological:     General: No focal deficit present.     Mental Status: She is alert and oriented to person, place, and time. Mental status is at baseline.     Cranial Nerves: No cranial nerve deficit.   Psychiatric:        Mood and Affect: Mood normal.     Labs reviewed: Recent Labs    09/25/23 1245 09/26/23 0448 09/27/23 0508 09/30/23 0000  NA 134* 136 138 139  K 3.8 3.8 3.9 4.6  CL 98 102 107 106  CO2 22 21* 22 24*  GLUCOSE 114* 91 90  --   BUN 119* 105* 86* 47*  CREATININE 2.14* 1.72* 1.44* 1.9*  CALCIUM  8.9 8.8* 8.6* 8.9   Recent Labs    06/13/23 0000  AST 30  ALT 21  ALKPHOS 74  ALBUMIN 3.5   Recent Labs    09/25/23 1245 09/25/23 2354 09/26/23 0448 09/30/23 0000  WBC 10.2 9.0 9.4 7.7  NEUTROABS  --   --  7.2  --   HGB 11.0* 10.8* 10.7* 9.4*  HCT 32.6* 32.1* 30.8* 28*  MCV 97.6 97.3 96.9  --   PLT 262 188 264 245   Lab Results  Component Value Date   TSH 4.325 04/25/2022   Lab Results  Component Value Date   HGBA1C 204 06/13/2023   Lab Results  Component Value Date   CHOL 208 (H) 01/28/2022   HDL 97 (A) 06/13/2023   LDLCALC 97 06/13/2023   TRIG 51 06/13/2023   CHOLHDL 2.6 01/28/2022    Significant Diagnostic Results in last 30 days:  DG CHEST PORT 1 VIEW Result Date: 09/27/2023 CLINICAL DATA:  10031 Cough 10031. EXAM: PORTABLE CHEST 1 VIEW COMPARISON:  04/04/2023. FINDINGS: Bilateral lungs appear hyperlucent with coarse bronchovascular markings, in keeping with COPD. Re-demonstration of left retrocardiac airspace opacity obscuring the left hemidiaphragm and blunting the left lateral costophrenic angle, suggesting combination of left lung atelectasis and/or consolidation with pleural effusion. No significant interval change since the prior study. Bilateral lungs otherwise appear clear. No dense consolidation or lung collapse. Bilateral costophrenic angles are clear. Stable cardio-mediastinal silhouette. There is a left sided 2-lead pacemaker. No acute osseous abnormalities. The soft tissues are within normal limits. IMPRESSION: No significant interval change since the prior study. Persistent left retrocardiac opacity, as described above.  Electronically Signed   By: Beula Brunswick M.D.   On: 09/27/2023 15:22   US  Renal Result Date: 09/25/2023 CLINICAL DATA:  Acute kidney injury EXAM: RENAL / URINARY TRACT ULTRASOUND COMPLETE COMPARISON:  04/22/2022 FINDINGS: Right Kidney: Renal measurements: 8.7 x 4.2 x 4.7 cm = volume: 90 mL. 1.9 cm midpole cyst which appears simple. No follow-up imaging recommended. Cortical thinning. Normal echotexture. No hydronephrosis. Left Kidney: Renal measurements: 9.6 x 5.3 x 5.4 cm = volume: 46 mL. Cortical thinning. Normal echotexture. No mass or hydronephrosis. Bladder: Decompressed Other: None. IMPRESSION: No acute findings.  No hydronephrosis. Electronically Signed   By: Janeece Mechanic M.D.   On: 09/25/2023 22:06    Assessment/Plan  1. Pleural effusion on left (Primary) Chronic and recently treated with levaquin  Discussed with Dr Venice Gillis Will order f/u CT scan at next apt in 2 weeks Ok to discharge to [facility]  2. Macrocytic anemia Prior B12 and MMA test normal Chronic disease state MCV 98 Will repeat with peripheral smear   3. Primary hypertension Improved Continue valsartan  and norvasc    4. Chronic diastolic congestive heart failure (HCC) Doing well on torsemide  three times weekly Monitor weights Low sodium diet Leg elevation hose  5. Stage 3a chronic kidney disease (HCC) Cr 1.19 09/30/23 Improved.  Continue to periodically monitor BMP and avoid nephrotoxic agents   6. Neuropathy Long term issues with normal b12 Has seen neurology   7. Abnormality of gait Will continue to work with therapy  8. Cerebral malformation On seizure prevention meds. Did have a seizure in 2023.     Labs/tests ordered:  CMP CBC peripheral smear

## 2023-10-08 DIAGNOSIS — M4807 Spinal stenosis, lumbosacral region: Secondary | ICD-10-CM | POA: Diagnosis not present

## 2023-10-08 DIAGNOSIS — R262 Difficulty in walking, not elsewhere classified: Secondary | ICD-10-CM | POA: Diagnosis not present

## 2023-10-08 DIAGNOSIS — M6281 Muscle weakness (generalized): Secondary | ICD-10-CM | POA: Diagnosis not present

## 2023-10-08 DIAGNOSIS — R41841 Cognitive communication deficit: Secondary | ICD-10-CM | POA: Diagnosis not present

## 2023-10-08 DIAGNOSIS — J9601 Acute respiratory failure with hypoxia: Secondary | ICD-10-CM | POA: Diagnosis not present

## 2023-10-08 DIAGNOSIS — I5033 Acute on chronic diastolic (congestive) heart failure: Secondary | ICD-10-CM | POA: Diagnosis not present

## 2023-10-08 DIAGNOSIS — R5382 Chronic fatigue, unspecified: Secondary | ICD-10-CM | POA: Diagnosis not present

## 2023-10-10 DIAGNOSIS — R5382 Chronic fatigue, unspecified: Secondary | ICD-10-CM | POA: Diagnosis not present

## 2023-10-10 DIAGNOSIS — M6281 Muscle weakness (generalized): Secondary | ICD-10-CM | POA: Diagnosis not present

## 2023-10-10 DIAGNOSIS — I5033 Acute on chronic diastolic (congestive) heart failure: Secondary | ICD-10-CM | POA: Diagnosis not present

## 2023-10-10 DIAGNOSIS — J9601 Acute respiratory failure with hypoxia: Secondary | ICD-10-CM | POA: Diagnosis not present

## 2023-10-10 DIAGNOSIS — R41841 Cognitive communication deficit: Secondary | ICD-10-CM | POA: Diagnosis not present

## 2023-10-10 DIAGNOSIS — R262 Difficulty in walking, not elsewhere classified: Secondary | ICD-10-CM | POA: Diagnosis not present

## 2023-10-10 DIAGNOSIS — M4807 Spinal stenosis, lumbosacral region: Secondary | ICD-10-CM | POA: Diagnosis not present

## 2023-10-11 ENCOUNTER — Other Ambulatory Visit: Payer: Self-pay | Admitting: Adult Health

## 2023-10-11 DIAGNOSIS — R41841 Cognitive communication deficit: Secondary | ICD-10-CM | POA: Diagnosis not present

## 2023-10-11 DIAGNOSIS — I5033 Acute on chronic diastolic (congestive) heart failure: Secondary | ICD-10-CM | POA: Diagnosis not present

## 2023-10-11 DIAGNOSIS — J9601 Acute respiratory failure with hypoxia: Secondary | ICD-10-CM | POA: Diagnosis not present

## 2023-10-11 DIAGNOSIS — R262 Difficulty in walking, not elsewhere classified: Secondary | ICD-10-CM | POA: Diagnosis not present

## 2023-10-11 DIAGNOSIS — M6281 Muscle weakness (generalized): Secondary | ICD-10-CM | POA: Diagnosis not present

## 2023-10-11 DIAGNOSIS — M4807 Spinal stenosis, lumbosacral region: Secondary | ICD-10-CM | POA: Diagnosis not present

## 2023-10-11 MED ORDER — PHENOBARBITAL 64.8 MG PO TABS: 64.8000 mg | ORAL_TABLET | Freq: Every day | ORAL | 5 refills | Status: DC

## 2023-10-12 DIAGNOSIS — M6281 Muscle weakness (generalized): Secondary | ICD-10-CM | POA: Diagnosis not present

## 2023-10-12 DIAGNOSIS — M4807 Spinal stenosis, lumbosacral region: Secondary | ICD-10-CM | POA: Diagnosis not present

## 2023-10-12 DIAGNOSIS — R41841 Cognitive communication deficit: Secondary | ICD-10-CM | POA: Diagnosis not present

## 2023-10-12 DIAGNOSIS — I5033 Acute on chronic diastolic (congestive) heart failure: Secondary | ICD-10-CM | POA: Diagnosis not present

## 2023-10-12 DIAGNOSIS — J9601 Acute respiratory failure with hypoxia: Secondary | ICD-10-CM | POA: Diagnosis not present

## 2023-10-12 DIAGNOSIS — R262 Difficulty in walking, not elsewhere classified: Secondary | ICD-10-CM | POA: Diagnosis not present

## 2023-10-14 DIAGNOSIS — R41841 Cognitive communication deficit: Secondary | ICD-10-CM | POA: Diagnosis not present

## 2023-10-14 DIAGNOSIS — M6281 Muscle weakness (generalized): Secondary | ICD-10-CM | POA: Diagnosis not present

## 2023-10-14 DIAGNOSIS — M4807 Spinal stenosis, lumbosacral region: Secondary | ICD-10-CM | POA: Diagnosis not present

## 2023-10-14 DIAGNOSIS — I5033 Acute on chronic diastolic (congestive) heart failure: Secondary | ICD-10-CM | POA: Diagnosis not present

## 2023-10-14 DIAGNOSIS — J9601 Acute respiratory failure with hypoxia: Secondary | ICD-10-CM | POA: Diagnosis not present

## 2023-10-14 DIAGNOSIS — R262 Difficulty in walking, not elsewhere classified: Secondary | ICD-10-CM | POA: Diagnosis not present

## 2023-10-15 DIAGNOSIS — M6281 Muscle weakness (generalized): Secondary | ICD-10-CM | POA: Diagnosis not present

## 2023-10-15 DIAGNOSIS — R41841 Cognitive communication deficit: Secondary | ICD-10-CM | POA: Diagnosis not present

## 2023-10-15 DIAGNOSIS — J9601 Acute respiratory failure with hypoxia: Secondary | ICD-10-CM | POA: Diagnosis not present

## 2023-10-15 DIAGNOSIS — R262 Difficulty in walking, not elsewhere classified: Secondary | ICD-10-CM | POA: Diagnosis not present

## 2023-10-15 DIAGNOSIS — M4807 Spinal stenosis, lumbosacral region: Secondary | ICD-10-CM | POA: Diagnosis not present

## 2023-10-15 DIAGNOSIS — I5033 Acute on chronic diastolic (congestive) heart failure: Secondary | ICD-10-CM | POA: Diagnosis not present

## 2023-10-16 DIAGNOSIS — J9601 Acute respiratory failure with hypoxia: Secondary | ICD-10-CM | POA: Diagnosis not present

## 2023-10-16 DIAGNOSIS — M6281 Muscle weakness (generalized): Secondary | ICD-10-CM | POA: Diagnosis not present

## 2023-10-16 DIAGNOSIS — R41841 Cognitive communication deficit: Secondary | ICD-10-CM | POA: Diagnosis not present

## 2023-10-16 DIAGNOSIS — R262 Difficulty in walking, not elsewhere classified: Secondary | ICD-10-CM | POA: Diagnosis not present

## 2023-10-16 DIAGNOSIS — M4807 Spinal stenosis, lumbosacral region: Secondary | ICD-10-CM | POA: Diagnosis not present

## 2023-10-16 DIAGNOSIS — I5033 Acute on chronic diastolic (congestive) heart failure: Secondary | ICD-10-CM | POA: Diagnosis not present

## 2023-10-17 DIAGNOSIS — M4807 Spinal stenosis, lumbosacral region: Secondary | ICD-10-CM | POA: Diagnosis not present

## 2023-10-17 DIAGNOSIS — R41841 Cognitive communication deficit: Secondary | ICD-10-CM | POA: Diagnosis not present

## 2023-10-17 DIAGNOSIS — R262 Difficulty in walking, not elsewhere classified: Secondary | ICD-10-CM | POA: Diagnosis not present

## 2023-10-17 DIAGNOSIS — J9601 Acute respiratory failure with hypoxia: Secondary | ICD-10-CM | POA: Diagnosis not present

## 2023-10-17 DIAGNOSIS — I5033 Acute on chronic diastolic (congestive) heart failure: Secondary | ICD-10-CM | POA: Diagnosis not present

## 2023-10-17 DIAGNOSIS — M6281 Muscle weakness (generalized): Secondary | ICD-10-CM | POA: Diagnosis not present

## 2023-10-18 DIAGNOSIS — M4807 Spinal stenosis, lumbosacral region: Secondary | ICD-10-CM | POA: Diagnosis not present

## 2023-10-18 DIAGNOSIS — I5033 Acute on chronic diastolic (congestive) heart failure: Secondary | ICD-10-CM | POA: Diagnosis not present

## 2023-10-18 DIAGNOSIS — R41841 Cognitive communication deficit: Secondary | ICD-10-CM | POA: Diagnosis not present

## 2023-10-18 DIAGNOSIS — M6281 Muscle weakness (generalized): Secondary | ICD-10-CM | POA: Diagnosis not present

## 2023-10-18 DIAGNOSIS — J9601 Acute respiratory failure with hypoxia: Secondary | ICD-10-CM | POA: Diagnosis not present

## 2023-10-18 DIAGNOSIS — R262 Difficulty in walking, not elsewhere classified: Secondary | ICD-10-CM | POA: Diagnosis not present

## 2023-10-18 LAB — BASIC METABOLIC PANEL WITH GFR
BUN: 39 — AB (ref 4–21)
CO2: 25 — AB (ref 13–22)
Chloride: 102 (ref 99–108)
Creatinine: 1.3 — AB (ref 0.5–1.1)
Glucose: 95
Potassium: 4.4 meq/L (ref 3.5–5.1)
Sodium: 138 (ref 137–147)

## 2023-10-18 LAB — CBC AND DIFFERENTIAL
HCT: 23 — AB (ref 36–46)
Hemoglobin: 8 — AB (ref 12.0–16.0)
Neutrophils Absolute: 4.1
Platelets: 274 10*3/uL (ref 150–400)
WBC: 5.8

## 2023-10-18 LAB — COMPREHENSIVE METABOLIC PANEL WITH GFR
Albumin: 3.2 — AB (ref 3.5–5.0)
Calcium: 8.8 (ref 8.7–10.7)
Globulin: 2.1
eGFR: 41

## 2023-10-18 LAB — CBC: RBC: 2.35 — AB (ref 3.87–5.11)

## 2023-10-21 ENCOUNTER — Encounter: Payer: Self-pay | Admitting: Adult Health

## 2023-10-21 ENCOUNTER — Ambulatory Visit: Admitting: Adult Health

## 2023-10-21 VITALS — BP 138/48 | HR 65 | Temp 98.6°F | Resp 18 | Ht 65.0 in | Wt 118.0 lb

## 2023-10-21 DIAGNOSIS — D539 Nutritional anemia, unspecified: Secondary | ICD-10-CM | POA: Diagnosis not present

## 2023-10-21 DIAGNOSIS — I10 Essential (primary) hypertension: Secondary | ICD-10-CM | POA: Diagnosis not present

## 2023-10-21 DIAGNOSIS — R413 Other amnesia: Secondary | ICD-10-CM | POA: Diagnosis not present

## 2023-10-21 DIAGNOSIS — N1831 Chronic kidney disease, stage 3a: Secondary | ICD-10-CM

## 2023-10-21 DIAGNOSIS — I5032 Chronic diastolic (congestive) heart failure: Secondary | ICD-10-CM

## 2023-10-21 DIAGNOSIS — R41841 Cognitive communication deficit: Secondary | ICD-10-CM | POA: Diagnosis not present

## 2023-10-21 DIAGNOSIS — J9601 Acute respiratory failure with hypoxia: Secondary | ICD-10-CM | POA: Diagnosis not present

## 2023-10-21 DIAGNOSIS — R634 Abnormal weight loss: Secondary | ICD-10-CM

## 2023-10-21 DIAGNOSIS — E782 Mixed hyperlipidemia: Secondary | ICD-10-CM | POA: Diagnosis not present

## 2023-10-21 DIAGNOSIS — G40209 Localization-related (focal) (partial) symptomatic epilepsy and epileptic syndromes with complex partial seizures, not intractable, without status epilepticus: Secondary | ICD-10-CM | POA: Diagnosis not present

## 2023-10-21 DIAGNOSIS — I442 Atrioventricular block, complete: Secondary | ICD-10-CM | POA: Diagnosis not present

## 2023-10-21 DIAGNOSIS — R262 Difficulty in walking, not elsewhere classified: Secondary | ICD-10-CM | POA: Diagnosis not present

## 2023-10-21 DIAGNOSIS — J90 Pleural effusion, not elsewhere classified: Secondary | ICD-10-CM | POA: Diagnosis not present

## 2023-10-21 DIAGNOSIS — I5033 Acute on chronic diastolic (congestive) heart failure: Secondary | ICD-10-CM | POA: Diagnosis not present

## 2023-10-21 DIAGNOSIS — M4807 Spinal stenosis, lumbosacral region: Secondary | ICD-10-CM | POA: Diagnosis not present

## 2023-10-21 DIAGNOSIS — M6281 Muscle weakness (generalized): Secondary | ICD-10-CM | POA: Diagnosis not present

## 2023-10-21 MED ORDER — TORSEMIDE 20 MG PO TABS
20.0000 mg | ORAL_TABLET | Freq: Every day | ORAL | 0 refills | Status: DC | PRN
Start: 2023-10-21 — End: 2024-03-16

## 2023-10-21 NOTE — Progress Notes (Signed)
 Location:  [facility]  POS: Clinic  Provider: Raylene Calamity, ANP  Code Status: DNR Goals of Care:     09/26/2023    2:48 PM  Advanced Directives  Would patient like information on creating a medical advance directive? No - Patient declined     Chief Complaint  Patient presents with   Medical Management of Chronic Issues    F/u after rehab discharge.     HPI:  [name] is an 88 year old female who presents for a two-week follow-up after a rehab stay following a hospitalization for pneumonia.  PMH significant for syncope and pacemaker, chronic left effusion, CHF, anemia, cerebral AV malformation on seizures preventions meds , afib not on doac due to the above, off amiodarone  due to GI s/e, diarrhea, s/p cholecystectomy, neuropathy , prior pericarditis treated with colchicine   She was hospitalized from April 16th to April 18th for an acute kidney injury and possible pneumonia. During her hospitalization, a chest x-ray showed a left retrocardiac opacity, which was treated as pneumonia with Levaquin . Prior to this, she had been on doxycycline  and prednisone . She was discharged back to [facility] on April [facility]th.  She experiences persistent chest congestion, particularly in the morning, accompanied by a cough that produces mucus she is unable to expel. No shortness of breath is noted. She is not very active and spends much of her time napping. She uses a walker for mobility and has recently started using a motorized scooter. No purulent sputum or fever or oxygen  requirement.  She has a history of congestive heart failure, anemia, and neuropathy. Her heart function was evaluated in March 2024, showing normal ejection fraction but elevated pulmonary artery pressure, dilated atrium, and pericardial effusion. She has a pacemaker due to complete heart block.  Her blood pressure has been erratic, with recent readings ranging from 168/64 to 120s/70s. Her blood pressure  medication, valsartan , was stopped during her hospitalization due to acute kidney injury but was restarted in rehab along with Norvasc . Her diuretic, torsemide , is currently taken three times a week with an as-needed dose if her weight increases significantly.  Anemia:  She is on iron  supplementation three times a week for anemia.  She has macrocytosis with normal B12 and folate, Hgb 8.0 10/18/23, trending down from prior values. Feels fatigue which is a chronic issue. She also receives Ensure daily to help with nutrition.    Past Medical History:  Diagnosis Date   Abnormal cardiovascular stress test    Carotid arterial disease (HCC)    MODERATE RIGHT   Cerebral AV malformation    DDD (degenerative disc disease)    SEVERE WITH SPINAL STENOSIS   Hypercholesterolemia    Hypertension    MVP (mitral valve prolapse)    MILD   Numbness    LEFT FOOT AND ANKLE   Osteopenia    Presence of permanent cardiac pacemaker    Pseudo-gout     Past Surgical History:  Procedure Laterality Date   APPENDECTOMY     BREAST MASS EXCISION     CARPAL TUNNEL RELEASE     CHOLECYSTECTOMY N/A 04/23/2022   Procedure: LAPAROSCOPIC CHOLECYSTECTOMY;  Surgeon: Oza Blumenthal, MD;  Location: [facility];  Service: General;  Laterality: N/A;   gallbladder     PACEMAKER IMPLANT N/A 11/07/2021   Procedure: PACEMAKER IMPLANT;  Surgeon: Tammie Fall, MD;  Location: [facility];  Service: Cardiovascular;  Laterality: N/A;   REMOVAL OF PARATHYROID  GLAND  STRESS NUCLEAR STUDY     ABNORMAL   TONSILLECTOMY      Allergies  Allergen Reactions   Aleve [Naproxen] Other (See Comments)   Azithromycin Other (See Comments)    Reaction not recalled   Penicillins Hives and Other (See Comments)    Bad reaction as a child after a shot of Penicillin   Pravastatin Other (See Comments)    Reaction not recalled   Sulfa Antibiotics Other (See Comments)   Sulfa Drugs Cross Reactors Other (See Comments)    Reaction not  recalled- stated she can take it "in small doses"   Tape Itching and Other (See Comments)    Cannot wear for an extended period of time   Naproxen Sodium Rash    Outpatient Encounter Medications as of 10/21/2023  Medication Sig   acetaminophen  (TYLENOL ) 500 MG tablet Take 1 tablet (500 mg total) by mouth every 6 (six) hours as needed for mild pain (pain score 1-3) or headache.   amLODipine  (NORVASC ) 5 MG tablet Take 1 tablet (5 mg total) by mouth daily.   b complex vitamins capsule Take 1 capsule by mouth daily.   fluticasone  (FLONASE ) 50 MCG/ACT nasal spray Place 1-2 sprays into both nostrils daily.   guaiFENesin -dextromethorphan (ROBITUSSIN DM) 100-10 MG/5ML syrup Take 10 mLs by mouth every 6 (six) hours as needed for cough.   ipratropium-albuterol  (DUONEB) 0.5-2.5 (3) MG/3ML SOLN Take 3 mLs by nebulization every 6 (six) hours as needed (SOB/Wheezing).   Iron , Ferrous Sulfate , 325 (65 Fe) MG TABS Take 325 mg by mouth 3 (three) times a week.   magnesium  oxide (MAG-OX) 400 (240 Mg) MG tablet Take 400 mg by mouth 2 (two) times daily.   ondansetron  (ZOFRAN -ODT) 4 MG disintegrating tablet Take 4 mg by mouth every 4 (four) hours as needed for nausea or vomiting.   PHENobarbital  (LUMINAL) 64.8 MG tablet Take 1 tablet (64.8 mg total) by mouth daily. Take 1 tablet every morning   phenytoin  (DILANTIN ) 100 MG ER capsule Take 200 mg by mouth daily.   Polyethyl Glycol-Propyl Glycol (SYSTANE) 0.4-0.3 % SOLN Place 1 drop into both eyes in the morning and at bedtime.   torsemide  (DEMADEX ) 20 MG tablet Take 1 tablet (20 mg total) by mouth 3 (three) times a week.   torsemide  (DEMADEX ) 20 MG tablet Take 1 tablet (20 mg total) by mouth daily as needed. For weight gain 3lbs in a day 5 lbs in a week   triamcinolone  cream (KENALOG ) 0.1 % Apply 1 Application topically 2 (two) times daily as needed.   valsartan  (DIOVAN ) 160 MG tablet Take 1 tablet (160 mg total) by mouth 2 (two) times daily.   No  facility-administered encounter medications on file as of 10/21/2023.    Review of Systems:  Review of Systems  Constitutional:  Positive for fatigue. Negative for activity change, appetite change, chills, diaphoresis, fever and unexpected weight change.  HENT:  Positive for congestion.   Respiratory:  Positive for cough (improving). Negative for shortness of breath and wheezing.   Cardiovascular:  Positive for leg swelling. Negative for chest pain and palpitations.  Gastrointestinal:  Negative for abdominal distention, abdominal pain, constipation and diarrhea.  Genitourinary:  Negative for difficulty urinating and dysuria.  Musculoskeletal:  Positive for gait problem. Negative for arthralgias, back pain, joint swelling and myalgias.  Neurological:  Negative for dizziness, tremors, seizures, syncope, facial asymmetry, speech difficulty, weakness, light-headedness, numbness and headaches.  Psychiatric/Behavioral:  Negative for agitation, behavioral problems and confusion.  Short term memory loss    Health Maintenance  Topic Date Due   DEXA SCAN  Never done   COVID-19 Vaccine (7 - 2024-25 season) 04/10/2024 (Originally 05/28/2023)   INFLUENZA VACCINE  01/10/2024   Medicare Annual Wellness (AWV)  08/04/2024   Pneumonia Vaccine 19+ Years old  Completed   Zoster Vaccines- Shingrix  Completed   HPV VACCINES  Aged Out   Meningococcal B Vaccine  Aged Out   DTaP/Tdap/Td  Discontinued    Physical Exam: Vitals:   10/21/23 0848 10/21/23 1403  BP: (!) 146/54 (!) 138/48  Pulse: 65   Resp: 18   Temp: 98.6 F (37 C)   SpO2: 98%   Weight: 118 lb (53.5 kg)   Height: 5\' 5"  (1.651 m)    Body mass index is 19.64 kg/m. Physical Exam Vitals and nursing note reviewed.  HENT:     Nose: Congestion present.     Mouth/Throat:     Mouth: Mucous membranes are moist.     Pharynx: Oropharynx is clear.  Cardiovascular:     Rate and Rhythm: Normal rate and regular rhythm.  Pulmonary:      Effort: Pulmonary effort is normal. No respiratory distress.     Breath sounds: No wheezing or rales.     Comments: Decreased left base Abdominal:     General: Bowel sounds are normal.     Palpations: Abdomen is soft. There is no mass.     Tenderness: There is no abdominal tenderness.     Comments: Slight firmness to abd but not tender.   Musculoskeletal:     Cervical back: No rigidity or tenderness.     Comments: BLE edema +2  Lymphadenopathy:     Cervical: Cervical adenopathy present.  Neurological:     General: No focal deficit present.     Mental Status: Mental status is at baseline.     Labs reviewed: Basic Metabolic Panel: Recent Labs    09/25/23 1245 09/26/23 0448 09/27/23 0508 09/30/23 0000 10/18/23 0000  NA 134* 136 138 139 138  K 3.8 3.8 3.9 4.6 4.4  CL 98 102 107 106 102  CO2 22 21* 22 24* 25*  GLUCOSE 114* 91 90  --   --   BUN 119* 105* 86* 47* 39*  CREATININE 2.14* 1.72* 1.44* 1.2*  1.9* 1.3*  CALCIUM  8.9 8.8* 8.6* 8.9 8.8   Liver Function Tests: Recent Labs    06/13/23 0000  AST 30  ALT 21  ALKPHOS 74  ALBUMIN 3.5   No results for input(s): "LIPASE", "AMYLASE" in the last 8760 hours. No results for input(s): "AMMONIA" in the last 8760 hours. CBC: Recent Labs    09/25/23 1245 09/25/23 2354 09/26/23 0448 09/30/23 0000 10/18/23 0000  WBC 10.2 9.0 9.4 7.7 5.8  NEUTROABS  --   --  7.2  --  4.10  HGB 11.0* 10.8* 10.7* 9.4* 8.0*  HCT 32.6* 32.1* 30.8* 28* 23*  MCV 97.6 97.3 96.9  --   --   PLT 262 188 264 245 274   Lipid Panel: Recent Labs    06/13/23 0000  HDL 97*  LDLCALC 97  TRIG 51   Lab Results  Component Value Date   HGBA1C 204 06/13/2023    Procedures since last visit: DG CHEST PORT 1 VIEW Result Date: 09/27/2023 CLINICAL DATA:  10031 Cough 10031. EXAM: PORTABLE CHEST 1 VIEW COMPARISON:  04/04/2023. FINDINGS: Bilateral lungs appear hyperlucent with coarse bronchovascular markings, in keeping with COPD. Re-demonstration of  left retrocardiac airspace opacity obscuring the left hemidiaphragm and blunting the left lateral costophrenic angle, suggesting combination of left lung atelectasis and/or consolidation with pleural effusion. No significant interval change since the prior study. Bilateral lungs otherwise appear clear. No dense consolidation or lung collapse. Bilateral costophrenic angles are clear. Stable cardio-mediastinal silhouette. There is a left sided 2-lead pacemaker. No acute osseous abnormalities. The soft tissues are within normal limits. IMPRESSION: No significant interval change since the prior study. Persistent left retrocardiac opacity, as described above. Electronically Signed   By: Beula Brunswick M.D.   On: 09/27/2023 15:22   US  Renal Result Date: 09/25/2023 CLINICAL DATA:  Acute kidney injury EXAM: RENAL / URINARY TRACT ULTRASOUND COMPLETE COMPARISON:  04/22/2022 FINDINGS: Right Kidney: Renal measurements: 8.7 x 4.2 x 4.7 cm = volume: 90 mL. 1.9 cm midpole cyst which appears simple. No follow-up imaging recommended. Cortical thinning. Normal echotexture. No hydronephrosis. Left Kidney: Renal measurements: 9.6 x 5.3 x 5.4 cm = volume: 46 mL. Cortical thinning. Normal echotexture. No mass or hydronephrosis. Bladder: Decompressed Other: None. IMPRESSION: No acute findings.  No hydronephrosis. Electronically Signed   By: Janeece Mechanic M.D.   On: 09/25/2023 22:06    Assessment/Plan  Pleural Effusion Persistent left retrocardiac opacity on chest x-ray,prior hx of persistent left pleural effusion.  CT scan of the chest and abd with contrast recommended to evaluate opacity and rule out other causes  - Order chest CT to evaluate persistent left retrocardiac opacity.  Pneumonia Recent pneumonia treated with antibiotics. Residual chest congestion and cough likely post-infectious changes. Improving  Congestive Heart Failure Diastolic heart failure with normal ejection fraction (60-65%). Elevated pulmonary  artery pressure, dilated atrium, and pericardial effusion noted. Fluid status well-managed. - Monitor weight daily at assisted living facility. - Adjust diuretic (torsemide ) dosage based on weight changes, with as needed doses if weight increases by more than 3 pounds in a day or 5 pounds in a week.  Hypertension Erratic blood pressure with satisfactory current reading of 138/48. Management complicated by recent acute kidney injury and medication adjustments. - Monitor blood pressure regularly at assisted living facility. - Review blood pressure trends and adjust medications as needed.  Acute Kidney Injury Recent acute kidney injury likely due to dehydration. Kidney function improved with creatinine at 1.2.  Anemia On iron  Macrocytosis Trending down Check hemoccults Recheck CBC 1 week Consider hematology referral  Hx of Cerebral AV malformation and seizures On phenobarbital  and phenytoin  No new seizures Followed by neurology   Follow-up Follow-up needed to review CT scan results and monitor ongoing health issues. - Schedule follow-up appointment in one month to review CT scan results and ongoing health issues.  F/U 1 month CBC 1 week  Has peripheral smear pending.   Total time :  time greater than 50% of total time spent doing pt counseling and coordination of care

## 2023-10-22 DIAGNOSIS — R262 Difficulty in walking, not elsewhere classified: Secondary | ICD-10-CM | POA: Diagnosis not present

## 2023-10-22 DIAGNOSIS — I5033 Acute on chronic diastolic (congestive) heart failure: Secondary | ICD-10-CM | POA: Diagnosis not present

## 2023-10-22 DIAGNOSIS — M6281 Muscle weakness (generalized): Secondary | ICD-10-CM | POA: Diagnosis not present

## 2023-10-22 DIAGNOSIS — R41841 Cognitive communication deficit: Secondary | ICD-10-CM | POA: Diagnosis not present

## 2023-10-22 DIAGNOSIS — M4807 Spinal stenosis, lumbosacral region: Secondary | ICD-10-CM | POA: Diagnosis not present

## 2023-10-22 DIAGNOSIS — J9601 Acute respiratory failure with hypoxia: Secondary | ICD-10-CM | POA: Diagnosis not present

## 2023-10-23 DIAGNOSIS — R262 Difficulty in walking, not elsewhere classified: Secondary | ICD-10-CM | POA: Diagnosis not present

## 2023-10-23 DIAGNOSIS — R41841 Cognitive communication deficit: Secondary | ICD-10-CM | POA: Diagnosis not present

## 2023-10-23 DIAGNOSIS — J9601 Acute respiratory failure with hypoxia: Secondary | ICD-10-CM | POA: Diagnosis not present

## 2023-10-23 DIAGNOSIS — I5033 Acute on chronic diastolic (congestive) heart failure: Secondary | ICD-10-CM | POA: Diagnosis not present

## 2023-10-23 DIAGNOSIS — M6281 Muscle weakness (generalized): Secondary | ICD-10-CM | POA: Diagnosis not present

## 2023-10-23 DIAGNOSIS — M4807 Spinal stenosis, lumbosacral region: Secondary | ICD-10-CM | POA: Diagnosis not present

## 2023-10-24 DIAGNOSIS — M6281 Muscle weakness (generalized): Secondary | ICD-10-CM | POA: Diagnosis not present

## 2023-10-24 DIAGNOSIS — I5033 Acute on chronic diastolic (congestive) heart failure: Secondary | ICD-10-CM | POA: Diagnosis not present

## 2023-10-24 DIAGNOSIS — R41841 Cognitive communication deficit: Secondary | ICD-10-CM | POA: Diagnosis not present

## 2023-10-24 DIAGNOSIS — R262 Difficulty in walking, not elsewhere classified: Secondary | ICD-10-CM | POA: Diagnosis not present

## 2023-10-24 DIAGNOSIS — J9601 Acute respiratory failure with hypoxia: Secondary | ICD-10-CM | POA: Diagnosis not present

## 2023-10-24 DIAGNOSIS — M4807 Spinal stenosis, lumbosacral region: Secondary | ICD-10-CM | POA: Diagnosis not present

## 2023-10-25 DIAGNOSIS — R262 Difficulty in walking, not elsewhere classified: Secondary | ICD-10-CM | POA: Diagnosis not present

## 2023-10-25 DIAGNOSIS — R41841 Cognitive communication deficit: Secondary | ICD-10-CM | POA: Diagnosis not present

## 2023-10-25 DIAGNOSIS — I5033 Acute on chronic diastolic (congestive) heart failure: Secondary | ICD-10-CM | POA: Diagnosis not present

## 2023-10-25 DIAGNOSIS — J9601 Acute respiratory failure with hypoxia: Secondary | ICD-10-CM | POA: Diagnosis not present

## 2023-10-25 DIAGNOSIS — M6281 Muscle weakness (generalized): Secondary | ICD-10-CM | POA: Diagnosis not present

## 2023-10-25 DIAGNOSIS — M4807 Spinal stenosis, lumbosacral region: Secondary | ICD-10-CM | POA: Diagnosis not present

## 2023-10-28 DIAGNOSIS — M4807 Spinal stenosis, lumbosacral region: Secondary | ICD-10-CM | POA: Diagnosis not present

## 2023-10-28 DIAGNOSIS — M6281 Muscle weakness (generalized): Secondary | ICD-10-CM | POA: Diagnosis not present

## 2023-10-28 DIAGNOSIS — R262 Difficulty in walking, not elsewhere classified: Secondary | ICD-10-CM | POA: Diagnosis not present

## 2023-10-28 DIAGNOSIS — R41841 Cognitive communication deficit: Secondary | ICD-10-CM | POA: Diagnosis not present

## 2023-10-28 DIAGNOSIS — J9601 Acute respiratory failure with hypoxia: Secondary | ICD-10-CM | POA: Diagnosis not present

## 2023-10-28 DIAGNOSIS — I5033 Acute on chronic diastolic (congestive) heart failure: Secondary | ICD-10-CM | POA: Diagnosis not present

## 2023-10-28 DIAGNOSIS — D6489 Other specified anemias: Secondary | ICD-10-CM | POA: Diagnosis not present

## 2023-10-28 NOTE — Addendum Note (Signed)
 Addended by: Americo Baker on: 10/28/2023 12:19 PM   Modules accepted: Orders

## 2023-10-29 DIAGNOSIS — R41841 Cognitive communication deficit: Secondary | ICD-10-CM | POA: Diagnosis not present

## 2023-10-29 DIAGNOSIS — R262 Difficulty in walking, not elsewhere classified: Secondary | ICD-10-CM | POA: Diagnosis not present

## 2023-10-29 DIAGNOSIS — I5033 Acute on chronic diastolic (congestive) heart failure: Secondary | ICD-10-CM | POA: Diagnosis not present

## 2023-10-29 DIAGNOSIS — M4807 Spinal stenosis, lumbosacral region: Secondary | ICD-10-CM | POA: Diagnosis not present

## 2023-10-29 DIAGNOSIS — J9601 Acute respiratory failure with hypoxia: Secondary | ICD-10-CM | POA: Diagnosis not present

## 2023-10-29 DIAGNOSIS — M6281 Muscle weakness (generalized): Secondary | ICD-10-CM | POA: Diagnosis not present

## 2023-10-30 DIAGNOSIS — R262 Difficulty in walking, not elsewhere classified: Secondary | ICD-10-CM | POA: Diagnosis not present

## 2023-10-30 DIAGNOSIS — M4807 Spinal stenosis, lumbosacral region: Secondary | ICD-10-CM | POA: Diagnosis not present

## 2023-10-30 DIAGNOSIS — M6281 Muscle weakness (generalized): Secondary | ICD-10-CM | POA: Diagnosis not present

## 2023-10-30 DIAGNOSIS — I5033 Acute on chronic diastolic (congestive) heart failure: Secondary | ICD-10-CM | POA: Diagnosis not present

## 2023-10-30 DIAGNOSIS — R41841 Cognitive communication deficit: Secondary | ICD-10-CM | POA: Diagnosis not present

## 2023-10-30 DIAGNOSIS — J9601 Acute respiratory failure with hypoxia: Secondary | ICD-10-CM | POA: Diagnosis not present

## 2023-10-31 DIAGNOSIS — I5033 Acute on chronic diastolic (congestive) heart failure: Secondary | ICD-10-CM | POA: Diagnosis not present

## 2023-10-31 DIAGNOSIS — M6281 Muscle weakness (generalized): Secondary | ICD-10-CM | POA: Diagnosis not present

## 2023-10-31 DIAGNOSIS — M4807 Spinal stenosis, lumbosacral region: Secondary | ICD-10-CM | POA: Diagnosis not present

## 2023-10-31 DIAGNOSIS — R41841 Cognitive communication deficit: Secondary | ICD-10-CM | POA: Diagnosis not present

## 2023-10-31 DIAGNOSIS — R262 Difficulty in walking, not elsewhere classified: Secondary | ICD-10-CM | POA: Diagnosis not present

## 2023-10-31 DIAGNOSIS — J9601 Acute respiratory failure with hypoxia: Secondary | ICD-10-CM | POA: Diagnosis not present

## 2023-11-01 DIAGNOSIS — M4807 Spinal stenosis, lumbosacral region: Secondary | ICD-10-CM | POA: Diagnosis not present

## 2023-11-01 DIAGNOSIS — R262 Difficulty in walking, not elsewhere classified: Secondary | ICD-10-CM | POA: Diagnosis not present

## 2023-11-01 DIAGNOSIS — R41841 Cognitive communication deficit: Secondary | ICD-10-CM | POA: Diagnosis not present

## 2023-11-01 DIAGNOSIS — J9601 Acute respiratory failure with hypoxia: Secondary | ICD-10-CM | POA: Diagnosis not present

## 2023-11-01 DIAGNOSIS — I5033 Acute on chronic diastolic (congestive) heart failure: Secondary | ICD-10-CM | POA: Diagnosis not present

## 2023-11-01 DIAGNOSIS — M6281 Muscle weakness (generalized): Secondary | ICD-10-CM | POA: Diagnosis not present

## 2023-11-04 DIAGNOSIS — M4807 Spinal stenosis, lumbosacral region: Secondary | ICD-10-CM | POA: Diagnosis not present

## 2023-11-04 DIAGNOSIS — M6281 Muscle weakness (generalized): Secondary | ICD-10-CM | POA: Diagnosis not present

## 2023-11-04 DIAGNOSIS — R41841 Cognitive communication deficit: Secondary | ICD-10-CM | POA: Diagnosis not present

## 2023-11-04 DIAGNOSIS — I5033 Acute on chronic diastolic (congestive) heart failure: Secondary | ICD-10-CM | POA: Diagnosis not present

## 2023-11-04 DIAGNOSIS — R262 Difficulty in walking, not elsewhere classified: Secondary | ICD-10-CM | POA: Diagnosis not present

## 2023-11-04 DIAGNOSIS — J9601 Acute respiratory failure with hypoxia: Secondary | ICD-10-CM | POA: Diagnosis not present

## 2023-11-06 ENCOUNTER — Ambulatory Visit (INDEPENDENT_AMBULATORY_CARE_PROVIDER_SITE_OTHER): Payer: Medicare Other

## 2023-11-06 DIAGNOSIS — R41841 Cognitive communication deficit: Secondary | ICD-10-CM | POA: Diagnosis not present

## 2023-11-06 DIAGNOSIS — M4807 Spinal stenosis, lumbosacral region: Secondary | ICD-10-CM | POA: Diagnosis not present

## 2023-11-06 DIAGNOSIS — M6281 Muscle weakness (generalized): Secondary | ICD-10-CM | POA: Diagnosis not present

## 2023-11-06 DIAGNOSIS — R55 Syncope and collapse: Secondary | ICD-10-CM

## 2023-11-06 DIAGNOSIS — I5033 Acute on chronic diastolic (congestive) heart failure: Secondary | ICD-10-CM | POA: Diagnosis not present

## 2023-11-06 DIAGNOSIS — R262 Difficulty in walking, not elsewhere classified: Secondary | ICD-10-CM | POA: Diagnosis not present

## 2023-11-06 DIAGNOSIS — J9601 Acute respiratory failure with hypoxia: Secondary | ICD-10-CM | POA: Diagnosis not present

## 2023-11-07 DIAGNOSIS — R262 Difficulty in walking, not elsewhere classified: Secondary | ICD-10-CM | POA: Diagnosis not present

## 2023-11-07 DIAGNOSIS — M4807 Spinal stenosis, lumbosacral region: Secondary | ICD-10-CM | POA: Diagnosis not present

## 2023-11-07 DIAGNOSIS — J9601 Acute respiratory failure with hypoxia: Secondary | ICD-10-CM | POA: Diagnosis not present

## 2023-11-07 DIAGNOSIS — R41841 Cognitive communication deficit: Secondary | ICD-10-CM | POA: Diagnosis not present

## 2023-11-07 DIAGNOSIS — I5033 Acute on chronic diastolic (congestive) heart failure: Secondary | ICD-10-CM | POA: Diagnosis not present

## 2023-11-07 DIAGNOSIS — M6281 Muscle weakness (generalized): Secondary | ICD-10-CM | POA: Diagnosis not present

## 2023-11-07 LAB — CUP PACEART REMOTE DEVICE CHECK
Battery Remaining Longevity: 131 mo
Battery Voltage: 3.03 V
Brady Statistic AP VP Percent: 0 %
Brady Statistic AP VS Percent: 0 %
Brady Statistic AS VP Percent: 96.96 %
Brady Statistic AS VS Percent: 3.04 %
Brady Statistic RA Percent Paced: 0 %
Brady Statistic RV Percent Paced: 96.96 %
Date Time Interrogation Session: 20250528010728
Implantable Lead Connection Status: 753985
Implantable Lead Connection Status: 753985
Implantable Lead Implant Date: 20230530
Implantable Lead Implant Date: 20230530
Implantable Lead Location: 753859
Implantable Lead Location: 753860
Implantable Lead Model: 3830
Implantable Lead Model: 5076
Implantable Pulse Generator Implant Date: 20230530
Lead Channel Impedance Value: 190 Ohm
Lead Channel Impedance Value: 304 Ohm
Lead Channel Impedance Value: 418 Ohm
Lead Channel Impedance Value: 475 Ohm
Lead Channel Pacing Threshold Amplitude: 0.875 V
Lead Channel Pacing Threshold Amplitude: 1.375 V
Lead Channel Pacing Threshold Pulse Width: 0.4 ms
Lead Channel Pacing Threshold Pulse Width: 0.4 ms
Lead Channel Sensing Intrinsic Amplitude: 0.125 mV
Lead Channel Sensing Intrinsic Amplitude: 0.25 mV
Lead Channel Sensing Intrinsic Amplitude: 9.25 mV
Lead Channel Sensing Intrinsic Amplitude: 9.25 mV
Lead Channel Setting Pacing Amplitude: 2 V
Lead Channel Setting Pacing Pulse Width: 0.4 ms
Lead Channel Setting Sensing Sensitivity: 1.2 mV
Zone Setting Status: 755011

## 2023-11-08 ENCOUNTER — Ambulatory Visit (HOSPITAL_BASED_OUTPATIENT_CLINIC_OR_DEPARTMENT_OTHER)
Admission: RE | Admit: 2023-11-08 | Discharge: 2023-11-08 | Disposition: A | Discharge status: Home / Self Care | Source: Ambulatory Visit | Attending: Adult Health | Admitting: Adult Health

## 2023-11-08 DIAGNOSIS — R634 Abnormal weight loss: Secondary | ICD-10-CM | POA: Insufficient documentation

## 2023-11-08 DIAGNOSIS — J90 Pleural effusion, not elsewhere classified: Secondary | ICD-10-CM

## 2023-11-08 DIAGNOSIS — J9601 Acute respiratory failure with hypoxia: Secondary | ICD-10-CM | POA: Diagnosis not present

## 2023-11-08 DIAGNOSIS — R41841 Cognitive communication deficit: Secondary | ICD-10-CM | POA: Diagnosis not present

## 2023-11-08 DIAGNOSIS — M4807 Spinal stenosis, lumbosacral region: Secondary | ICD-10-CM | POA: Diagnosis not present

## 2023-11-08 DIAGNOSIS — R262 Difficulty in walking, not elsewhere classified: Secondary | ICD-10-CM | POA: Diagnosis not present

## 2023-11-08 DIAGNOSIS — I5033 Acute on chronic diastolic (congestive) heart failure: Secondary | ICD-10-CM | POA: Diagnosis not present

## 2023-11-08 DIAGNOSIS — M6281 Muscle weakness (generalized): Secondary | ICD-10-CM | POA: Diagnosis not present

## 2023-11-08 MED ORDER — IOHEXOL 300 MG/ML  SOLN
80.0000 mL | Freq: Once | INTRAMUSCULAR | Status: AC | PRN
  Administered 2023-11-08: 80 mL via INTRAVENOUS

## 2023-11-11 DIAGNOSIS — I5033 Acute on chronic diastolic (congestive) heart failure: Secondary | ICD-10-CM | POA: Diagnosis not present

## 2023-11-11 DIAGNOSIS — R262 Difficulty in walking, not elsewhere classified: Secondary | ICD-10-CM | POA: Diagnosis not present

## 2023-11-11 DIAGNOSIS — R5382 Chronic fatigue, unspecified: Secondary | ICD-10-CM | POA: Diagnosis not present

## 2023-11-11 DIAGNOSIS — M6281 Muscle weakness (generalized): Secondary | ICD-10-CM | POA: Diagnosis not present

## 2023-11-11 DIAGNOSIS — M4807 Spinal stenosis, lumbosacral region: Secondary | ICD-10-CM | POA: Diagnosis not present

## 2023-11-11 DIAGNOSIS — J9601 Acute respiratory failure with hypoxia: Secondary | ICD-10-CM | POA: Diagnosis not present

## 2023-11-11 DIAGNOSIS — R41841 Cognitive communication deficit: Secondary | ICD-10-CM | POA: Diagnosis not present

## 2023-11-12 DIAGNOSIS — J9601 Acute respiratory failure with hypoxia: Secondary | ICD-10-CM | POA: Diagnosis not present

## 2023-11-12 DIAGNOSIS — R41841 Cognitive communication deficit: Secondary | ICD-10-CM | POA: Diagnosis not present

## 2023-11-12 DIAGNOSIS — M6281 Muscle weakness (generalized): Secondary | ICD-10-CM | POA: Diagnosis not present

## 2023-11-12 DIAGNOSIS — R262 Difficulty in walking, not elsewhere classified: Secondary | ICD-10-CM | POA: Diagnosis not present

## 2023-11-12 DIAGNOSIS — I5033 Acute on chronic diastolic (congestive) heart failure: Secondary | ICD-10-CM | POA: Diagnosis not present

## 2023-11-12 DIAGNOSIS — M4807 Spinal stenosis, lumbosacral region: Secondary | ICD-10-CM | POA: Diagnosis not present

## 2023-11-13 DIAGNOSIS — R262 Difficulty in walking, not elsewhere classified: Secondary | ICD-10-CM | POA: Diagnosis not present

## 2023-11-13 DIAGNOSIS — M6281 Muscle weakness (generalized): Secondary | ICD-10-CM | POA: Diagnosis not present

## 2023-11-13 DIAGNOSIS — J9601 Acute respiratory failure with hypoxia: Secondary | ICD-10-CM | POA: Diagnosis not present

## 2023-11-13 DIAGNOSIS — I5033 Acute on chronic diastolic (congestive) heart failure: Secondary | ICD-10-CM | POA: Diagnosis not present

## 2023-11-13 DIAGNOSIS — R41841 Cognitive communication deficit: Secondary | ICD-10-CM | POA: Diagnosis not present

## 2023-11-13 DIAGNOSIS — M4807 Spinal stenosis, lumbosacral region: Secondary | ICD-10-CM | POA: Diagnosis not present

## 2023-11-14 ENCOUNTER — Ambulatory Visit: Payer: Self-pay | Admitting: Internal Medicine

## 2023-11-14 DIAGNOSIS — R41841 Cognitive communication deficit: Secondary | ICD-10-CM | POA: Diagnosis not present

## 2023-11-14 DIAGNOSIS — I5033 Acute on chronic diastolic (congestive) heart failure: Secondary | ICD-10-CM | POA: Diagnosis not present

## 2023-11-14 DIAGNOSIS — M4807 Spinal stenosis, lumbosacral region: Secondary | ICD-10-CM | POA: Diagnosis not present

## 2023-11-14 DIAGNOSIS — E7800 Pure hypercholesterolemia, unspecified: Secondary | ICD-10-CM | POA: Diagnosis not present

## 2023-11-14 DIAGNOSIS — R262 Difficulty in walking, not elsewhere classified: Secondary | ICD-10-CM | POA: Diagnosis not present

## 2023-11-14 DIAGNOSIS — M6281 Muscle weakness (generalized): Secondary | ICD-10-CM | POA: Diagnosis not present

## 2023-11-14 DIAGNOSIS — J9601 Acute respiratory failure with hypoxia: Secondary | ICD-10-CM | POA: Diagnosis not present

## 2023-11-14 DIAGNOSIS — G40209 Localization-related (focal) (partial) symptomatic epilepsy and epileptic syndromes with complex partial seizures, not intractable, without status epilepticus: Secondary | ICD-10-CM | POA: Diagnosis not present

## 2023-11-14 LAB — BASIC METABOLIC PANEL WITH GFR
BUN: 62 — AB (ref 4–21)
CO2: 22 (ref 13–22)
Chloride: 104 (ref 99–108)
Creatinine: 1.6 — AB (ref 0.5–1.1)
Glucose: 92
Potassium: 4.5 meq/L (ref 3.5–5.1)
Sodium: 140 (ref 137–147)

## 2023-11-14 LAB — HEPATIC FUNCTION PANEL
ALT: 14 U/L (ref 7–35)
AST: 24 (ref 13–35)
Alkaline Phosphatase: 86 (ref 25–125)
Bilirubin, Total: 0.2

## 2023-11-14 LAB — CBC AND DIFFERENTIAL
HCT: 28 — AB (ref 36–46)
Hemoglobin: 9.5 — AB (ref 12.0–16.0)
Platelets: 225 10*3/uL (ref 150–400)
WBC: 4.2

## 2023-11-14 LAB — TSH: TSH: 4.05 (ref 0.41–5.90)

## 2023-11-14 LAB — COMPREHENSIVE METABOLIC PANEL WITH GFR
Albumin: 3.6 (ref 3.5–5.0)
Calcium: 9.1 (ref 8.7–10.7)
eGFR: 32

## 2023-11-14 LAB — CBC: RBC: 2.71 — AB (ref 3.87–5.11)

## 2023-11-15 ENCOUNTER — Telehealth: Payer: Self-pay | Admitting: Adult Health

## 2023-11-15 MED ORDER — TORSEMIDE 10 MG PO TABS: 10.0000 mg | ORAL_TABLET | ORAL | Status: DC

## 2023-11-15 NOTE — Telephone Encounter (Signed)
 BUN and Cr are elevated above baseline 61.5 and 1.58 Encourage fluids Monitor vitals Reduce torsemide  to 10 mg M W F  Repeat labs 1 week

## 2023-11-18 ENCOUNTER — Encounter: Admitting: Adult Health

## 2023-11-18 DIAGNOSIS — J9601 Acute respiratory failure with hypoxia: Secondary | ICD-10-CM | POA: Diagnosis not present

## 2023-11-18 DIAGNOSIS — I5033 Acute on chronic diastolic (congestive) heart failure: Secondary | ICD-10-CM | POA: Diagnosis not present

## 2023-11-18 DIAGNOSIS — M6281 Muscle weakness (generalized): Secondary | ICD-10-CM | POA: Diagnosis not present

## 2023-11-18 DIAGNOSIS — R262 Difficulty in walking, not elsewhere classified: Secondary | ICD-10-CM | POA: Diagnosis not present

## 2023-11-18 DIAGNOSIS — R41841 Cognitive communication deficit: Secondary | ICD-10-CM | POA: Diagnosis not present

## 2023-11-18 DIAGNOSIS — M4807 Spinal stenosis, lumbosacral region: Secondary | ICD-10-CM | POA: Diagnosis not present

## 2023-11-18 NOTE — Progress Notes (Unsigned)
 Cardiology Office Note:  .   Date:  11/18/2023  ID:  [name], DOB [birth year], MRN [id number] PCP: Marguerite Shiley, MD  [facility] Providers Cardiologist:  Peter Swaziland, MD {  History of Present Illness: [name]   [name] is a 88 y.o. female w/PMHx of  cerebral AV malformation, IDA, seizure (post lap-chole Nove 2023) HTN, HLD, MVP AFib CHB w/PPM Pericarditis, pericardial effusion (ideopathic)  01/27/2022 with pleuritic chest pain. She was ruled out for MI. Her coronary CTA showed nonobstructive CAD. She was noted to have moderate pericardial effusion which was confirmed via echocardiogram. At that time she was noted to have normal PPM parameters suggesting no lead migration. Her pericarditis was treated with colchicine    Follow-up echocardiogram 02/13/2022 showed no change in moderate effusion, no evidence of tamponade, and plan for repeat echocardiogram   She saw Dr. Carolynne Citron 06/29/22, minimal palpitations, no changes were made, AFib described as persistent  March 2024 echo Mostly posterior pericardial effusion measuring 1.77 cm. Compared with  the echo 02/2022, the effusion is larger. No evidence of tamponade.  Moderate pericardial effusion. There is no evidence of cardiac tamponade.  Moderate pleural effusion in the left  lateral region.   Saw Gen cards team 12/27/22, doing well, support stockings for her edema, remained fairly active.  Saw Dr. Swaziland 06/26/23, again doing well, still at [facility], no symptoms exam findings to suggest and changes in her effusion No changes were made  Today's visit is scheduled as an annual visit ROS:   She is doing well Resides at [facility] in ALF now Denies any symptoms of concern outside of her neuropathy pain/\ No CP, SOB No near syncope or syncope   Device information MDT dual chamber PPM implanted 11/07/21 VVIR programming  Arrhythmia/AAD hx AFib found 11/2021 not a candidate for anticoagulation due to her cerebral  AVMs. She is not a candidate for Watchman device since she cannot take antiplatelet or anticoagulants   Amiodarone  started Sept 2023 quickly stopped 2/2 GI intolerance  Studies Reviewed: [name]    EKG not done today  DEVICE interrogation done today and reviewed by myself Battery and lead measurements are good 3 NSVTs (since Sept 2024) 98.4% VP   Echocardiogram 08/23/2022 IMPRESSIONS   1. Left ventricular ejection fraction, by estimation, is 60 to 65%. The  left ventricle has normal function. The left ventricle has no regional  wall motion abnormalities. There is mild concentric left ventricular  hypertrophy. Left ventricular diastolic  function could not be evaluated. Elevated left ventricular end-diastolic  pressure.   2. Right ventricular systolic function is normal. The right ventricular  size is normal. There is severely elevated pulmonary artery systolic  pressure.   3. Left atrial size was severely dilated.   4. Right atrial size was severely dilated.   5. Mostly posterior pericardial effusion measuring 1.77 cm. Compared with  the echo 02/2022, the effusion is larger. No evidence of tamponade.  Moderate pericardial effusion. There is no evidence of cardiac tamponade.  Moderate pleural effusion in the left  lateral region.   6. The mitral valve is normal in structure. Trivial mitral valve  regurgitation. No evidence of mitral stenosis.   7. The aortic valve is tricuspid. Aortic valve regurgitation is not  visualized. No aortic stenosis is present.   8. The inferior vena cava is normal in size with <50% respiratory  variability, suggesting right atrial pressure of 8 mmHg.   Risk Assessment/Calculations:    Physical Exam:  VS:  There were no vitals taken for this visit.   Wt Readings from Last 3 Encounters:  10/21/23 118 lb (53.5 kg)  10/07/23 118 lb (53.5 kg)  09/30/23 114 lb (51.7 kg)    GEN: Well nourished, quite thin,  in no acute distress NECK: No JVD; No carotid  bruits CARDIAC: irreg-irreg, no murmurs, rubs, gallops RESPIRATORY:  CTA b/l without rales, wheezing or rhonchi  ABDOMEN: Soft, non-tender, non-distended EXTREMITIES: No edema; No deformity   PPM site: is stable, no thinning, fluctuation, tethering  ASSESSMENT AND PLAN: .    PPM intact function no programming changes made  permanent AFib Not an a/c candidate 2/2 cerebral AVM controlled rates  Chronic pericardial effusion No symptoms to suggest clinic change C/w Dr. Yehuda Helms    Dispo: remotes as usual, back with EP in clinic again in a year, sooner if needed  Signed, Debbie Fails, PA-C

## 2023-11-19 DIAGNOSIS — J9601 Acute respiratory failure with hypoxia: Secondary | ICD-10-CM | POA: Diagnosis not present

## 2023-11-19 DIAGNOSIS — M4807 Spinal stenosis, lumbosacral region: Secondary | ICD-10-CM | POA: Diagnosis not present

## 2023-11-19 DIAGNOSIS — I5033 Acute on chronic diastolic (congestive) heart failure: Secondary | ICD-10-CM | POA: Diagnosis not present

## 2023-11-19 DIAGNOSIS — R41841 Cognitive communication deficit: Secondary | ICD-10-CM | POA: Diagnosis not present

## 2023-11-19 DIAGNOSIS — M6281 Muscle weakness (generalized): Secondary | ICD-10-CM | POA: Diagnosis not present

## 2023-11-19 DIAGNOSIS — R262 Difficulty in walking, not elsewhere classified: Secondary | ICD-10-CM | POA: Diagnosis not present

## 2023-11-20 DIAGNOSIS — J9601 Acute respiratory failure with hypoxia: Secondary | ICD-10-CM | POA: Diagnosis not present

## 2023-11-20 DIAGNOSIS — M4807 Spinal stenosis, lumbosacral region: Secondary | ICD-10-CM | POA: Diagnosis not present

## 2023-11-20 DIAGNOSIS — R262 Difficulty in walking, not elsewhere classified: Secondary | ICD-10-CM | POA: Diagnosis not present

## 2023-11-20 DIAGNOSIS — R41841 Cognitive communication deficit: Secondary | ICD-10-CM | POA: Diagnosis not present

## 2023-11-20 DIAGNOSIS — M6281 Muscle weakness (generalized): Secondary | ICD-10-CM | POA: Diagnosis not present

## 2023-11-20 DIAGNOSIS — I5033 Acute on chronic diastolic (congestive) heart failure: Secondary | ICD-10-CM | POA: Diagnosis not present

## 2023-11-21 ENCOUNTER — Encounter: Payer: Self-pay | Admitting: Physician Assistant

## 2023-11-21 ENCOUNTER — Ambulatory Visit: Attending: Physician Assistant | Admitting: Physician Assistant

## 2023-11-21 VITALS — BP 150/60 | HR 67 | Ht 65.0 in | Wt 119.4 lb

## 2023-11-21 DIAGNOSIS — M4807 Spinal stenosis, lumbosacral region: Secondary | ICD-10-CM | POA: Diagnosis not present

## 2023-11-21 DIAGNOSIS — I4821 Permanent atrial fibrillation: Secondary | ICD-10-CM | POA: Insufficient documentation

## 2023-11-21 DIAGNOSIS — M6281 Muscle weakness (generalized): Secondary | ICD-10-CM | POA: Diagnosis not present

## 2023-11-21 DIAGNOSIS — Z950 Presence of cardiac pacemaker: Secondary | ICD-10-CM | POA: Diagnosis not present

## 2023-11-21 DIAGNOSIS — I3139 Other pericardial effusion (noninflammatory): Secondary | ICD-10-CM | POA: Insufficient documentation

## 2023-11-21 DIAGNOSIS — R262 Difficulty in walking, not elsewhere classified: Secondary | ICD-10-CM | POA: Diagnosis not present

## 2023-11-21 DIAGNOSIS — R41841 Cognitive communication deficit: Secondary | ICD-10-CM | POA: Diagnosis not present

## 2023-11-21 DIAGNOSIS — J9601 Acute respiratory failure with hypoxia: Secondary | ICD-10-CM | POA: Diagnosis not present

## 2023-11-21 DIAGNOSIS — D649 Anemia, unspecified: Secondary | ICD-10-CM | POA: Diagnosis not present

## 2023-11-21 DIAGNOSIS — I5033 Acute on chronic diastolic (congestive) heart failure: Secondary | ICD-10-CM | POA: Diagnosis not present

## 2023-11-21 LAB — CUP PACEART INCLINIC DEVICE CHECK
Battery Remaining Longevity: 132 mo
Battery Voltage: 3.03 V
Brady Statistic AP VP Percent: 0 %
Brady Statistic AP VS Percent: 0 %
Brady Statistic AS VP Percent: 94.8 %
Brady Statistic AS VS Percent: 5.2 %
Brady Statistic RA Percent Paced: 26.45 %
Brady Statistic RV Percent Paced: 94.8 %
Date Time Interrogation Session: 20250612141059
Implantable Lead Connection Status: 753985
Implantable Lead Connection Status: 753985
Implantable Lead Implant Date: 20230530
Implantable Lead Implant Date: 20230530
Implantable Lead Location: 753859
Implantable Lead Location: 753860
Implantable Lead Model: 3830
Implantable Lead Model: 5076
Implantable Pulse Generator Implant Date: 20230530
Lead Channel Impedance Value: 228 Ohm
Lead Channel Impedance Value: 342 Ohm
Lead Channel Impedance Value: 437 Ohm
Lead Channel Impedance Value: 456 Ohm
Lead Channel Pacing Threshold Amplitude: 1 V
Lead Channel Pacing Threshold Amplitude: 1.375 V
Lead Channel Pacing Threshold Pulse Width: 0.4 ms
Lead Channel Pacing Threshold Pulse Width: 0.4 ms
Lead Channel Sensing Intrinsic Amplitude: 0.125 mV
Lead Channel Sensing Intrinsic Amplitude: 0.25 mV
Lead Channel Sensing Intrinsic Amplitude: 8.625 mV
Lead Channel Sensing Intrinsic Amplitude: 8.875 mV
Lead Channel Setting Pacing Amplitude: 2 V
Lead Channel Setting Pacing Pulse Width: 0.4 ms
Lead Channel Setting Sensing Sensitivity: 1.2 mV
Zone Setting Status: 755011

## 2023-11-21 NOTE — Patient Instructions (Signed)
 Medication Instructions:   Your physician recommends that you continue on your current medications as directed. Please refer to the Current Medication list given to you today.  *If you need a refill on your cardiac medications before your next appointment, please call your pharmacy*  Lab Work: NONE ORDERED  TODAY    If you have labs (blood work) drawn today and your tests are completely normal, you will receive your results only by: MyChart Message (if you have MyChart) OR A paper copy in the mail If you have any lab test that is abnormal or we need to change your treatment, we will call you to review the results.  Testing/Procedures: NONE ORDERED  TODAY     Follow-Up: At [facility], you and your health needs are our priority.  As part of our continuing mission to provide you with exceptional heart care, our providers are all part of one team.  This team includes your primary Cardiologist (physician) and Advanced Practice Providers or APPs (Physician Assistants and Nurse Practitioners) who all work together to provide you with the care you need, when you need it.  Your next appointment:   1 year(s)  Provider:   You may see  or one of the following Advanced Practice Providers on your designated Care Team:   Mertha Abrahams, PA-C Michael Andy Tillery, PA-C Creighton Doffing, NP   We recommend signing up for the patient portal called MyChart.  Sign up information is provided on this After Visit Summary.  MyChart is used to connect with patients for Virtual Visits (Telemedicine).  Patients are able to view lab/test results, encounter notes, upcoming appointments, etc.  Non-urgent messages can be sent to your provider as well.   To learn more about what you can do with MyChart, go to [email or web address].   Other Instructions

## 2023-11-22 DIAGNOSIS — M4807 Spinal stenosis, lumbosacral region: Secondary | ICD-10-CM | POA: Diagnosis not present

## 2023-11-22 DIAGNOSIS — M6281 Muscle weakness (generalized): Secondary | ICD-10-CM | POA: Diagnosis not present

## 2023-11-22 DIAGNOSIS — I5033 Acute on chronic diastolic (congestive) heart failure: Secondary | ICD-10-CM | POA: Diagnosis not present

## 2023-11-22 DIAGNOSIS — R262 Difficulty in walking, not elsewhere classified: Secondary | ICD-10-CM | POA: Diagnosis not present

## 2023-11-22 DIAGNOSIS — R41841 Cognitive communication deficit: Secondary | ICD-10-CM | POA: Diagnosis not present

## 2023-11-22 DIAGNOSIS — J9601 Acute respiratory failure with hypoxia: Secondary | ICD-10-CM | POA: Diagnosis not present

## 2023-11-25 DIAGNOSIS — M6281 Muscle weakness (generalized): Secondary | ICD-10-CM | POA: Diagnosis not present

## 2023-11-25 DIAGNOSIS — J9601 Acute respiratory failure with hypoxia: Secondary | ICD-10-CM | POA: Diagnosis not present

## 2023-11-25 DIAGNOSIS — I5033 Acute on chronic diastolic (congestive) heart failure: Secondary | ICD-10-CM | POA: Diagnosis not present

## 2023-11-25 DIAGNOSIS — R41841 Cognitive communication deficit: Secondary | ICD-10-CM | POA: Diagnosis not present

## 2023-11-25 DIAGNOSIS — M4807 Spinal stenosis, lumbosacral region: Secondary | ICD-10-CM | POA: Diagnosis not present

## 2023-11-25 DIAGNOSIS — R262 Difficulty in walking, not elsewhere classified: Secondary | ICD-10-CM | POA: Diagnosis not present

## 2023-11-26 ENCOUNTER — Non-Acute Institutional Stay: Admitting: Internal Medicine

## 2023-11-26 ENCOUNTER — Encounter: Payer: Self-pay | Admitting: Internal Medicine

## 2023-11-26 VITALS — BP 150/50 | Temp 98.3°F | Ht 65.0 in | Wt 122.0 lb

## 2023-11-26 DIAGNOSIS — N1832 Chronic kidney disease, stage 3b: Secondary | ICD-10-CM | POA: Diagnosis not present

## 2023-11-26 DIAGNOSIS — E782 Mixed hyperlipidemia: Secondary | ICD-10-CM | POA: Diagnosis not present

## 2023-11-26 DIAGNOSIS — G40209 Localization-related (focal) (partial) symptomatic epilepsy and epileptic syndromes with complex partial seizures, not intractable, without status epilepticus: Secondary | ICD-10-CM | POA: Diagnosis not present

## 2023-11-26 DIAGNOSIS — D631 Anemia in chronic kidney disease: Secondary | ICD-10-CM | POA: Diagnosis not present

## 2023-11-26 DIAGNOSIS — I10 Essential (primary) hypertension: Secondary | ICD-10-CM

## 2023-11-26 DIAGNOSIS — G629 Polyneuropathy, unspecified: Secondary | ICD-10-CM

## 2023-11-26 DIAGNOSIS — I442 Atrioventricular block, complete: Secondary | ICD-10-CM

## 2023-11-26 DIAGNOSIS — I5032 Chronic diastolic (congestive) heart failure: Secondary | ICD-10-CM

## 2023-11-26 DIAGNOSIS — N1831 Chronic kidney disease, stage 3a: Secondary | ICD-10-CM | POA: Diagnosis not present

## 2023-11-26 DIAGNOSIS — I480 Paroxysmal atrial fibrillation: Secondary | ICD-10-CM

## 2023-11-26 DIAGNOSIS — R269 Unspecified abnormalities of gait and mobility: Secondary | ICD-10-CM

## 2023-11-26 NOTE — Progress Notes (Signed)
 Location:  Medical illustrator of Service:  Clinic (12)  Provider:   Code Status: *** Goals of Care:     09/26/2023    2:48 PM  Advanced Directives  Would patient like information on creating a medical advance directive? No - Patient declined     Chief Complaint  Patient presents with   Follow-up    HPI: Patient is a 88 y.o. female seen today for medical management of chronic diseases.     Past Medical History:  Diagnosis Date   Abnormal cardiovascular stress test    Carotid arterial disease (HCC)    MODERATE RIGHT   Cerebral AV malformation    DDD (degenerative disc disease)    SEVERE WITH SPINAL STENOSIS   Hypercholesterolemia    Hypertension    MVP (mitral valve prolapse)    MILD   Numbness    LEFT FOOT AND ANKLE   Osteopenia    Presence of permanent cardiac pacemaker    Pseudo-gout     Past Surgical History:  Procedure Laterality Date   APPENDECTOMY     BREAST MASS EXCISION     CARPAL TUNNEL RELEASE     CHOLECYSTECTOMY N/A 04/23/2022   Procedure: LAPAROSCOPIC CHOLECYSTECTOMY;  Surgeon: Oza Blumenthal, MD;  Location: [facility];  Service: General;  Laterality: N/A;   gallbladder     PACEMAKER IMPLANT N/A 11/07/2021   Procedure: PACEMAKER IMPLANT;  Surgeon: Tammie Fall, MD;  Location: [facility];  Service: Cardiovascular;  Laterality: N/A;   REMOVAL OF PARATHYROID  GLAND     STRESS NUCLEAR STUDY     ABNORMAL   TONSILLECTOMY      Allergies  Allergen Reactions   Aleve [Naproxen] Other (See Comments)   Azithromycin Other (See Comments)    Reaction not recalled   Penicillins Hives and Other (See Comments)    Bad reaction as a child after a shot of Penicillin   Pravastatin Other (See Comments)    Reaction not recalled   Sulfa Antibiotics Other (See Comments)   Sulfa Drugs Cross Reactors Other (See Comments)    Reaction not recalled- stated she can take it in small doses   Tape Itching and Other (See Comments)     Cannot wear for an extended period of time   Naproxen Sodium Rash    Outpatient Encounter Medications as of 11/26/2023  Medication Sig   acetaminophen  (TYLENOL ) 500 MG tablet Take 1 tablet (500 mg total) by mouth every 6 (six) hours as needed for mild pain (pain score 1-3) or headache.   amLODipine  (NORVASC ) 5 MG tablet Take 1 tablet (5 mg total) by mouth daily.   b complex vitamins capsule Take 1 capsule by mouth daily.   fluticasone  (FLONASE ) 50 MCG/ACT nasal spray Place 1-2 sprays into both nostrils daily.   guaiFENesin -dextromethorphan (ROBITUSSIN DM) 100-10 MG/5ML syrup Take 10 mLs by mouth every 6 (six) hours as needed for cough.   ipratropium-albuterol  (DUONEB) 0.5-2.5 (3) MG/3ML SOLN Take 3 mLs by nebulization every 6 (six) hours as needed (SOB/Wheezing).   Iron , Ferrous Sulfate , 325 (65 Fe) MG TABS Take 325 mg by mouth 3 (three) times a week.   magnesium  oxide (MAG-OX) 400 (240 Mg) MG tablet Take 400 mg by mouth 2 (two) times daily.   ondansetron  (ZOFRAN -ODT) 4 MG disintegrating tablet Take 4 mg by mouth every 4 (four) hours as needed for nausea or vomiting.   PHENobarbital  (LUMINAL) 64.8 MG tablet Take 1 tablet (64.8 mg total) by  mouth daily. Take 1 tablet every morning   phenytoin  (DILANTIN ) 100 MG ER capsule Take 200 mg by mouth daily.   Polyethyl Glycol-Propyl Glycol (SYSTANE) 0.4-0.3 % SOLN Place 1 drop into both eyes in the morning and at bedtime.   torsemide  (DEMADEX ) 10 MG tablet Take 1 tablet (10 mg total) by mouth 3 (three) times a week.   torsemide  (DEMADEX ) 20 MG tablet Take 1 tablet (20 mg total) by mouth daily as needed. For weight gain 3lbs in a day 5 lbs in a week   triamcinolone  cream (KENALOG ) 0.1 % Apply 1 Application topically 2 (two) times daily as needed.   valsartan  (DIOVAN ) 160 MG tablet Take 1 tablet (160 mg total) by mouth 2 (two) times daily.   No facility-administered encounter medications on file as of 11/26/2023.    Review of Systems:  Review of  Systems  Health Maintenance  Topic Date Due   DEXA SCAN  Never done   COVID-19 Vaccine (7 - 2024-25 season) 04/10/2024 (Originally 05/28/2023)   INFLUENZA VACCINE  01/10/2024   Medicare Annual Wellness (AWV)  08/04/2024   Pneumococcal Vaccine: 50+ Years  Completed   Zoster Vaccines- Shingrix  Completed   HPV VACCINES  Aged Out   Meningococcal B Vaccine  Aged Out   DTaP/Tdap/Td  Discontinued    Physical Exam: Vitals:   11/26/23 1029  BP: (!) 144/50  Temp: 98.3 F (36.8 C)  SpO2: 99%  Weight: 122 lb (55.3 kg)  Height: 5' 5 (1.651 m)   Body mass index is 20.3 kg/m. Physical Exam  Labs reviewed: Basic Metabolic Panel: Recent Labs    09/25/23 1245 09/26/23 0448 09/27/23 0508 09/30/23 0000 10/18/23 0000 11/14/23 0000  NA 134* 136 138 139 138 140  K 3.8 3.8 3.9 4.6 4.4 4.5  CL 98 102 107 106 102 104  CO2 22 21* 22 24* 25* 22  GLUCOSE 114* 91 90  --   --   --   BUN 119* 105* 86* 47* 39* 62*  CREATININE 2.14* 1.72* 1.44* 1.2*  1.9* 1.3* 1.6*  CALCIUM  8.9 8.8* 8.6* 8.9 8.8 9.1  TSH  --   --   --   --   --  4.05   Liver Function Tests: Recent Labs    06/13/23 0000 10/18/23 0000 11/14/23 0000  AST 30  --  24  ALT 21  --  14  ALKPHOS 74  --  86  ALBUMIN 3.5 3.2* 3.6   No results for input(s): LIPASE, AMYLASE in the last 8760 hours. No results for input(s): AMMONIA in the last 8760 hours. CBC: Recent Labs    09/25/23 1245 09/25/23 2354 09/26/23 0448 09/30/23 0000 10/18/23 0000 11/14/23 0000  WBC 10.2 9.0 9.4 7.7 5.8 4.2  NEUTROABS  --   --  7.2  --  4.10  --   HGB 11.0* 10.8* 10.7* 9.4* 8.0* 9.5*  HCT 32.6* 32.1* 30.8* 28* 23* 28*  MCV 97.6 97.3 96.9  --   --   --   PLT 262 188 264 245 274 225   Lipid Panel: Recent Labs    06/13/23 0000  HDL 97*  LDLCALC 97  TRIG 51   Lab Results  Component Value Date   HGBA1C 204 06/13/2023    Procedures since last visit: CUP PACEART INCLINIC DEVICE CHECK Result Date: 11/21/2023 Normal in-clinic  _dual__ chamber pacemaker check (programmed VVIR). Presenting Rhythm: _AF/VP and VS__ . Routine testing of thresholds, sensing, and impedance demonstrate stable parameters  and no programming changes needed at this time. presumed permanant AFib, 3 NSVTs. Estimated longevity __10.8 years__ . Pt enrolled in remote follow-up. RU  CT Chest W Contrast Result Date: 11/13/2023 EXAM: CT CHEST WITH CONTRAST 11/08/2023 01:49:52 PM TECHNIQUE: CT of the chest was performed with the administration of intravenous contrast. Multiplanar reformatted images are provided for review. Automated exposure control, iterative reconstruction, and/or weight based adjustment of the mA/kV was utilized to reduce the radiation dose to as low as reasonably achievable. CONTRAST: 80mL iohexol  (OMNIPAQUE ) 300 MG/ML solution COMPARISON: Cardiac CT chest dated 01/27/2022. CLINICAL HISTORY: Pleural effusion, known or suspected (Ped 0-17y). Unintentional weight loss, diarrhea. Denies chest or abdominal pain. History of pacemaker, cholecystectomy, appendectomy. FINDINGS: MEDIASTINUM: Cardiomegaly with left cardiomediastinal shift. Mild coronary atherosclerosis of the right coronary artery. Left subclavian pacemaker. LYMPH NODES: No mediastinal, hilar or axillary lymphadenopathy. LUNGS AND PLEURA: Mild lingular and left lower lobe atelectasis. Small left pleural effusion. SOFT TISSUES/BONES: Mild degenerative changes of the visualized thoracolumbar spine. UPPER ABDOMEN: Limited images of the upper abdomen demonstrate no acute abnormality. IMPRESSION: 1. Cardiomegaly. 2. Small left pleural effusion. Electronically signed by: Zadie Herter MD 11/13/2023 07:50 PM EDT RP Workstation: [id number]   CT ABDOMEN PELVIS W CONTRAST Result Date: 11/08/2023 EXAMINATION: CT ABDOMEN PELVIS W CONTRAST CLINICAL INDICATION: Female, 88 years old. Weight loss, unintended TECHNIQUE: Axial CT of the abdomen and pelvis with 100 cc Omnipaque  300 intravenous contrast.  Multiplanar reformations provided. Unless otherwise specified, incidental thyroid , adrenal, renal lesions do not require dedicated imaging follow up. Additionally, any mentioned pulmonary nodules do not require dedicated imaging follow-up based on the Fleischner guidelines unless otherwise specified. Coronary calcifications are not identified unless otherwise specified. COMPARISON: 06/22/2021 FINDINGS: The heart is enlarged. There are coronary calcifications. Cardiac conduction device partially imaged. There are minimal atelectatic changes in the lung bases. There is a small left pleural effusion. The liver appears normal. Gallbladder surgically absent. The spleen contains a small cyst. Pancreas is normal. The right adrenal is normal. Similar suspected left adrenal adenoma. Bilateral renal cysts are noted. The abdominal aorta is normal in caliber. Scattered atherosclerotic changes are present. The urinary bladder is normal. The uterus is normal. There is colonic diverticulosis. Large and small bowel loops are otherwise within normal limits. No free fluid or adenopathy. There is diffuse osseous demineralization. There is scoliosis with degenerative changes of the spine and bony pelvis. IMPRESSION: No acute findings in the abdomen or pelvis. Small left pleural effusion. DOSE REDUCTION: This exam was performed according to our departmental dose-optimization program which includes automated exposure control, adjustment of the mA and/or kV according to patient size and/or use of iterative reconstruction technique. Electronically signed by: [place] Engel MD 11/08/2023 05:24 PM EDT RP Workstation: [id number]   CUP PACEART REMOTE DEVICE CHECK Result Date: 11/07/2023 PPM Scheduled remote reviewed. Normal device function.  Presenting rhythm:  AS/VP Next remote 91 days. [place], CVRS   Assessment/Plan There are no diagnoses linked to this encounter.   Labs/tests ordered:  * No order type specified * Next appt:  Visit date  not found

## 2023-11-26 NOTE — Progress Notes (Signed)
9

## 2023-11-27 DIAGNOSIS — R262 Difficulty in walking, not elsewhere classified: Secondary | ICD-10-CM | POA: Diagnosis not present

## 2023-11-27 DIAGNOSIS — M6281 Muscle weakness (generalized): Secondary | ICD-10-CM | POA: Diagnosis not present

## 2023-11-27 DIAGNOSIS — I5033 Acute on chronic diastolic (congestive) heart failure: Secondary | ICD-10-CM | POA: Diagnosis not present

## 2023-11-27 DIAGNOSIS — J9601 Acute respiratory failure with hypoxia: Secondary | ICD-10-CM | POA: Diagnosis not present

## 2023-11-27 DIAGNOSIS — M4807 Spinal stenosis, lumbosacral region: Secondary | ICD-10-CM | POA: Diagnosis not present

## 2023-11-27 DIAGNOSIS — R41841 Cognitive communication deficit: Secondary | ICD-10-CM | POA: Diagnosis not present

## 2023-11-29 DIAGNOSIS — I5033 Acute on chronic diastolic (congestive) heart failure: Secondary | ICD-10-CM | POA: Diagnosis not present

## 2023-11-29 DIAGNOSIS — J9601 Acute respiratory failure with hypoxia: Secondary | ICD-10-CM | POA: Diagnosis not present

## 2023-11-29 DIAGNOSIS — M6281 Muscle weakness (generalized): Secondary | ICD-10-CM | POA: Diagnosis not present

## 2023-11-29 DIAGNOSIS — M4807 Spinal stenosis, lumbosacral region: Secondary | ICD-10-CM | POA: Diagnosis not present

## 2023-11-29 DIAGNOSIS — R41841 Cognitive communication deficit: Secondary | ICD-10-CM | POA: Diagnosis not present

## 2023-11-29 DIAGNOSIS — R262 Difficulty in walking, not elsewhere classified: Secondary | ICD-10-CM | POA: Diagnosis not present

## 2023-12-02 DIAGNOSIS — J9601 Acute respiratory failure with hypoxia: Secondary | ICD-10-CM | POA: Diagnosis not present

## 2023-12-02 DIAGNOSIS — M6281 Muscle weakness (generalized): Secondary | ICD-10-CM | POA: Diagnosis not present

## 2023-12-02 DIAGNOSIS — I5033 Acute on chronic diastolic (congestive) heart failure: Secondary | ICD-10-CM | POA: Diagnosis not present

## 2023-12-02 DIAGNOSIS — R262 Difficulty in walking, not elsewhere classified: Secondary | ICD-10-CM | POA: Diagnosis not present

## 2023-12-02 DIAGNOSIS — R41841 Cognitive communication deficit: Secondary | ICD-10-CM | POA: Diagnosis not present

## 2023-12-02 DIAGNOSIS — M4807 Spinal stenosis, lumbosacral region: Secondary | ICD-10-CM | POA: Diagnosis not present

## 2023-12-03 DIAGNOSIS — R262 Difficulty in walking, not elsewhere classified: Secondary | ICD-10-CM | POA: Diagnosis not present

## 2023-12-03 DIAGNOSIS — I5033 Acute on chronic diastolic (congestive) heart failure: Secondary | ICD-10-CM | POA: Diagnosis not present

## 2023-12-03 DIAGNOSIS — J9601 Acute respiratory failure with hypoxia: Secondary | ICD-10-CM | POA: Diagnosis not present

## 2023-12-03 DIAGNOSIS — M6281 Muscle weakness (generalized): Secondary | ICD-10-CM | POA: Diagnosis not present

## 2023-12-03 DIAGNOSIS — R41841 Cognitive communication deficit: Secondary | ICD-10-CM | POA: Diagnosis not present

## 2023-12-03 DIAGNOSIS — M4807 Spinal stenosis, lumbosacral region: Secondary | ICD-10-CM | POA: Diagnosis not present

## 2023-12-04 DIAGNOSIS — M4807 Spinal stenosis, lumbosacral region: Secondary | ICD-10-CM | POA: Diagnosis not present

## 2023-12-04 DIAGNOSIS — R262 Difficulty in walking, not elsewhere classified: Secondary | ICD-10-CM | POA: Diagnosis not present

## 2023-12-04 DIAGNOSIS — R41841 Cognitive communication deficit: Secondary | ICD-10-CM | POA: Diagnosis not present

## 2023-12-04 DIAGNOSIS — I5033 Acute on chronic diastolic (congestive) heart failure: Secondary | ICD-10-CM | POA: Diagnosis not present

## 2023-12-04 DIAGNOSIS — M6281 Muscle weakness (generalized): Secondary | ICD-10-CM | POA: Diagnosis not present

## 2023-12-04 DIAGNOSIS — J9601 Acute respiratory failure with hypoxia: Secondary | ICD-10-CM | POA: Diagnosis not present

## 2023-12-05 DIAGNOSIS — M6281 Muscle weakness (generalized): Secondary | ICD-10-CM | POA: Diagnosis not present

## 2023-12-05 DIAGNOSIS — R41841 Cognitive communication deficit: Secondary | ICD-10-CM | POA: Diagnosis not present

## 2023-12-05 DIAGNOSIS — R262 Difficulty in walking, not elsewhere classified: Secondary | ICD-10-CM | POA: Diagnosis not present

## 2023-12-05 DIAGNOSIS — M4807 Spinal stenosis, lumbosacral region: Secondary | ICD-10-CM | POA: Diagnosis not present

## 2023-12-05 DIAGNOSIS — I5033 Acute on chronic diastolic (congestive) heart failure: Secondary | ICD-10-CM | POA: Diagnosis not present

## 2023-12-05 DIAGNOSIS — J9601 Acute respiratory failure with hypoxia: Secondary | ICD-10-CM | POA: Diagnosis not present

## 2023-12-06 DIAGNOSIS — R262 Difficulty in walking, not elsewhere classified: Secondary | ICD-10-CM | POA: Diagnosis not present

## 2023-12-06 DIAGNOSIS — M4807 Spinal stenosis, lumbosacral region: Secondary | ICD-10-CM | POA: Diagnosis not present

## 2023-12-06 DIAGNOSIS — R41841 Cognitive communication deficit: Secondary | ICD-10-CM | POA: Diagnosis not present

## 2023-12-06 DIAGNOSIS — M6281 Muscle weakness (generalized): Secondary | ICD-10-CM | POA: Diagnosis not present

## 2023-12-06 DIAGNOSIS — J9601 Acute respiratory failure with hypoxia: Secondary | ICD-10-CM | POA: Diagnosis not present

## 2023-12-06 DIAGNOSIS — I5033 Acute on chronic diastolic (congestive) heart failure: Secondary | ICD-10-CM | POA: Diagnosis not present

## 2023-12-09 DIAGNOSIS — R41841 Cognitive communication deficit: Secondary | ICD-10-CM | POA: Diagnosis not present

## 2023-12-09 DIAGNOSIS — I5033 Acute on chronic diastolic (congestive) heart failure: Secondary | ICD-10-CM | POA: Diagnosis not present

## 2023-12-09 DIAGNOSIS — R262 Difficulty in walking, not elsewhere classified: Secondary | ICD-10-CM | POA: Diagnosis not present

## 2023-12-09 DIAGNOSIS — J9601 Acute respiratory failure with hypoxia: Secondary | ICD-10-CM | POA: Diagnosis not present

## 2023-12-09 DIAGNOSIS — M4807 Spinal stenosis, lumbosacral region: Secondary | ICD-10-CM | POA: Diagnosis not present

## 2023-12-09 DIAGNOSIS — M6281 Muscle weakness (generalized): Secondary | ICD-10-CM | POA: Diagnosis not present

## 2023-12-10 DIAGNOSIS — R262 Difficulty in walking, not elsewhere classified: Secondary | ICD-10-CM | POA: Diagnosis not present

## 2023-12-10 DIAGNOSIS — M6281 Muscle weakness (generalized): Secondary | ICD-10-CM | POA: Diagnosis not present

## 2023-12-10 DIAGNOSIS — M4807 Spinal stenosis, lumbosacral region: Secondary | ICD-10-CM | POA: Diagnosis not present

## 2023-12-10 DIAGNOSIS — J9601 Acute respiratory failure with hypoxia: Secondary | ICD-10-CM | POA: Diagnosis not present

## 2023-12-10 DIAGNOSIS — I5033 Acute on chronic diastolic (congestive) heart failure: Secondary | ICD-10-CM | POA: Diagnosis not present

## 2023-12-10 DIAGNOSIS — R5382 Chronic fatigue, unspecified: Secondary | ICD-10-CM | POA: Diagnosis not present

## 2023-12-10 DIAGNOSIS — R41841 Cognitive communication deficit: Secondary | ICD-10-CM | POA: Diagnosis not present

## 2023-12-11 DIAGNOSIS — I5033 Acute on chronic diastolic (congestive) heart failure: Secondary | ICD-10-CM | POA: Diagnosis not present

## 2023-12-11 DIAGNOSIS — M6281 Muscle weakness (generalized): Secondary | ICD-10-CM | POA: Diagnosis not present

## 2023-12-11 DIAGNOSIS — R41841 Cognitive communication deficit: Secondary | ICD-10-CM | POA: Diagnosis not present

## 2023-12-11 DIAGNOSIS — R262 Difficulty in walking, not elsewhere classified: Secondary | ICD-10-CM | POA: Diagnosis not present

## 2023-12-11 DIAGNOSIS — M4807 Spinal stenosis, lumbosacral region: Secondary | ICD-10-CM | POA: Diagnosis not present

## 2023-12-11 DIAGNOSIS — J9601 Acute respiratory failure with hypoxia: Secondary | ICD-10-CM | POA: Diagnosis not present

## 2023-12-12 DIAGNOSIS — J9601 Acute respiratory failure with hypoxia: Secondary | ICD-10-CM | POA: Diagnosis not present

## 2023-12-12 DIAGNOSIS — M4807 Spinal stenosis, lumbosacral region: Secondary | ICD-10-CM | POA: Diagnosis not present

## 2023-12-12 DIAGNOSIS — M6281 Muscle weakness (generalized): Secondary | ICD-10-CM | POA: Diagnosis not present

## 2023-12-12 DIAGNOSIS — R262 Difficulty in walking, not elsewhere classified: Secondary | ICD-10-CM | POA: Diagnosis not present

## 2023-12-12 DIAGNOSIS — R41841 Cognitive communication deficit: Secondary | ICD-10-CM | POA: Diagnosis not present

## 2023-12-12 DIAGNOSIS — I5033 Acute on chronic diastolic (congestive) heart failure: Secondary | ICD-10-CM | POA: Diagnosis not present

## 2023-12-16 DIAGNOSIS — R41841 Cognitive communication deficit: Secondary | ICD-10-CM | POA: Diagnosis not present

## 2023-12-16 DIAGNOSIS — M4807 Spinal stenosis, lumbosacral region: Secondary | ICD-10-CM | POA: Diagnosis not present

## 2023-12-16 DIAGNOSIS — M6281 Muscle weakness (generalized): Secondary | ICD-10-CM | POA: Diagnosis not present

## 2023-12-16 DIAGNOSIS — J9601 Acute respiratory failure with hypoxia: Secondary | ICD-10-CM | POA: Diagnosis not present

## 2023-12-16 DIAGNOSIS — R262 Difficulty in walking, not elsewhere classified: Secondary | ICD-10-CM | POA: Diagnosis not present

## 2023-12-16 DIAGNOSIS — I5033 Acute on chronic diastolic (congestive) heart failure: Secondary | ICD-10-CM | POA: Diagnosis not present

## 2023-12-17 DIAGNOSIS — D509 Iron deficiency anemia, unspecified: Secondary | ICD-10-CM | POA: Diagnosis not present

## 2023-12-17 LAB — BASIC METABOLIC PANEL WITH GFR
BUN: 38 — AB (ref 4–21)
CO2: 24 — AB (ref 13–22)
Chloride: 104 (ref 99–108)
Creatinine: 1.2 — AB (ref 0.5–1.1)
Glucose: 94
Potassium: 4.5 meq/L (ref 3.5–5.1)
Sodium: 138 (ref 137–147)

## 2023-12-17 LAB — COMPREHENSIVE METABOLIC PANEL WITH GFR
Calcium: 8.6 — AB (ref 8.7–10.7)
eGFR: 44

## 2023-12-18 DIAGNOSIS — R41841 Cognitive communication deficit: Secondary | ICD-10-CM | POA: Diagnosis not present

## 2023-12-18 DIAGNOSIS — R262 Difficulty in walking, not elsewhere classified: Secondary | ICD-10-CM | POA: Diagnosis not present

## 2023-12-18 DIAGNOSIS — M4807 Spinal stenosis, lumbosacral region: Secondary | ICD-10-CM | POA: Diagnosis not present

## 2023-12-18 DIAGNOSIS — I5033 Acute on chronic diastolic (congestive) heart failure: Secondary | ICD-10-CM | POA: Diagnosis not present

## 2023-12-18 DIAGNOSIS — M6281 Muscle weakness (generalized): Secondary | ICD-10-CM | POA: Diagnosis not present

## 2023-12-18 DIAGNOSIS — J9601 Acute respiratory failure with hypoxia: Secondary | ICD-10-CM | POA: Diagnosis not present

## 2023-12-19 DIAGNOSIS — R262 Difficulty in walking, not elsewhere classified: Secondary | ICD-10-CM | POA: Diagnosis not present

## 2023-12-19 DIAGNOSIS — M4807 Spinal stenosis, lumbosacral region: Secondary | ICD-10-CM | POA: Diagnosis not present

## 2023-12-19 DIAGNOSIS — J9601 Acute respiratory failure with hypoxia: Secondary | ICD-10-CM | POA: Diagnosis not present

## 2023-12-19 DIAGNOSIS — M6281 Muscle weakness (generalized): Secondary | ICD-10-CM | POA: Diagnosis not present

## 2023-12-19 DIAGNOSIS — I5033 Acute on chronic diastolic (congestive) heart failure: Secondary | ICD-10-CM | POA: Diagnosis not present

## 2023-12-19 DIAGNOSIS — R41841 Cognitive communication deficit: Secondary | ICD-10-CM | POA: Diagnosis not present

## 2023-12-20 DIAGNOSIS — M6281 Muscle weakness (generalized): Secondary | ICD-10-CM | POA: Diagnosis not present

## 2023-12-20 DIAGNOSIS — R41841 Cognitive communication deficit: Secondary | ICD-10-CM | POA: Diagnosis not present

## 2023-12-20 DIAGNOSIS — M4807 Spinal stenosis, lumbosacral region: Secondary | ICD-10-CM | POA: Diagnosis not present

## 2023-12-20 DIAGNOSIS — R262 Difficulty in walking, not elsewhere classified: Secondary | ICD-10-CM | POA: Diagnosis not present

## 2023-12-20 DIAGNOSIS — I5033 Acute on chronic diastolic (congestive) heart failure: Secondary | ICD-10-CM | POA: Diagnosis not present

## 2023-12-20 DIAGNOSIS — J9601 Acute respiratory failure with hypoxia: Secondary | ICD-10-CM | POA: Diagnosis not present

## 2023-12-23 DIAGNOSIS — I5033 Acute on chronic diastolic (congestive) heart failure: Secondary | ICD-10-CM | POA: Diagnosis not present

## 2023-12-23 DIAGNOSIS — M6281 Muscle weakness (generalized): Secondary | ICD-10-CM | POA: Diagnosis not present

## 2023-12-23 DIAGNOSIS — M4807 Spinal stenosis, lumbosacral region: Secondary | ICD-10-CM | POA: Diagnosis not present

## 2023-12-23 DIAGNOSIS — J9601 Acute respiratory failure with hypoxia: Secondary | ICD-10-CM | POA: Diagnosis not present

## 2023-12-23 DIAGNOSIS — R262 Difficulty in walking, not elsewhere classified: Secondary | ICD-10-CM | POA: Diagnosis not present

## 2023-12-23 DIAGNOSIS — R41841 Cognitive communication deficit: Secondary | ICD-10-CM | POA: Diagnosis not present

## 2023-12-24 DIAGNOSIS — I5033 Acute on chronic diastolic (congestive) heart failure: Secondary | ICD-10-CM | POA: Diagnosis not present

## 2023-12-24 DIAGNOSIS — R41841 Cognitive communication deficit: Secondary | ICD-10-CM | POA: Diagnosis not present

## 2023-12-24 DIAGNOSIS — J9601 Acute respiratory failure with hypoxia: Secondary | ICD-10-CM | POA: Diagnosis not present

## 2023-12-24 DIAGNOSIS — M6281 Muscle weakness (generalized): Secondary | ICD-10-CM | POA: Diagnosis not present

## 2023-12-24 DIAGNOSIS — M4807 Spinal stenosis, lumbosacral region: Secondary | ICD-10-CM | POA: Diagnosis not present

## 2023-12-24 DIAGNOSIS — R262 Difficulty in walking, not elsewhere classified: Secondary | ICD-10-CM | POA: Diagnosis not present

## 2023-12-25 DIAGNOSIS — R41841 Cognitive communication deficit: Secondary | ICD-10-CM | POA: Diagnosis not present

## 2023-12-25 DIAGNOSIS — M6281 Muscle weakness (generalized): Secondary | ICD-10-CM | POA: Diagnosis not present

## 2023-12-25 DIAGNOSIS — J9601 Acute respiratory failure with hypoxia: Secondary | ICD-10-CM | POA: Diagnosis not present

## 2023-12-25 DIAGNOSIS — R262 Difficulty in walking, not elsewhere classified: Secondary | ICD-10-CM | POA: Diagnosis not present

## 2023-12-25 DIAGNOSIS — I5033 Acute on chronic diastolic (congestive) heart failure: Secondary | ICD-10-CM | POA: Diagnosis not present

## 2023-12-25 DIAGNOSIS — M4807 Spinal stenosis, lumbosacral region: Secondary | ICD-10-CM | POA: Diagnosis not present

## 2023-12-26 DIAGNOSIS — M4807 Spinal stenosis, lumbosacral region: Secondary | ICD-10-CM | POA: Diagnosis not present

## 2023-12-26 DIAGNOSIS — M6281 Muscle weakness (generalized): Secondary | ICD-10-CM | POA: Diagnosis not present

## 2023-12-26 DIAGNOSIS — I5033 Acute on chronic diastolic (congestive) heart failure: Secondary | ICD-10-CM | POA: Diagnosis not present

## 2023-12-26 DIAGNOSIS — R262 Difficulty in walking, not elsewhere classified: Secondary | ICD-10-CM | POA: Diagnosis not present

## 2023-12-26 DIAGNOSIS — R41841 Cognitive communication deficit: Secondary | ICD-10-CM | POA: Diagnosis not present

## 2023-12-26 DIAGNOSIS — J9601 Acute respiratory failure with hypoxia: Secondary | ICD-10-CM | POA: Diagnosis not present

## 2023-12-27 DIAGNOSIS — R262 Difficulty in walking, not elsewhere classified: Secondary | ICD-10-CM | POA: Diagnosis not present

## 2023-12-27 DIAGNOSIS — M6281 Muscle weakness (generalized): Secondary | ICD-10-CM | POA: Diagnosis not present

## 2023-12-27 DIAGNOSIS — R41841 Cognitive communication deficit: Secondary | ICD-10-CM | POA: Diagnosis not present

## 2023-12-27 DIAGNOSIS — M4807 Spinal stenosis, lumbosacral region: Secondary | ICD-10-CM | POA: Diagnosis not present

## 2023-12-27 DIAGNOSIS — J9601 Acute respiratory failure with hypoxia: Secondary | ICD-10-CM | POA: Diagnosis not present

## 2023-12-27 DIAGNOSIS — I5033 Acute on chronic diastolic (congestive) heart failure: Secondary | ICD-10-CM | POA: Diagnosis not present

## 2023-12-30 DIAGNOSIS — M6281 Muscle weakness (generalized): Secondary | ICD-10-CM | POA: Diagnosis not present

## 2023-12-30 DIAGNOSIS — M4807 Spinal stenosis, lumbosacral region: Secondary | ICD-10-CM | POA: Diagnosis not present

## 2023-12-30 DIAGNOSIS — R262 Difficulty in walking, not elsewhere classified: Secondary | ICD-10-CM | POA: Diagnosis not present

## 2023-12-30 DIAGNOSIS — R41841 Cognitive communication deficit: Secondary | ICD-10-CM | POA: Diagnosis not present

## 2023-12-30 DIAGNOSIS — J9601 Acute respiratory failure with hypoxia: Secondary | ICD-10-CM | POA: Diagnosis not present

## 2023-12-30 DIAGNOSIS — I5033 Acute on chronic diastolic (congestive) heart failure: Secondary | ICD-10-CM | POA: Diagnosis not present

## 2023-12-30 NOTE — Addendum Note (Signed)
 Addended by: VICCI SELLER A on: 12/30/2023 12:11 PM   Modules accepted: Orders

## 2023-12-30 NOTE — Progress Notes (Signed)
 Remote pacemaker transmission.

## 2024-01-01 DIAGNOSIS — M4807 Spinal stenosis, lumbosacral region: Secondary | ICD-10-CM | POA: Diagnosis not present

## 2024-01-01 DIAGNOSIS — I5033 Acute on chronic diastolic (congestive) heart failure: Secondary | ICD-10-CM | POA: Diagnosis not present

## 2024-01-01 DIAGNOSIS — R41841 Cognitive communication deficit: Secondary | ICD-10-CM | POA: Diagnosis not present

## 2024-01-01 DIAGNOSIS — J9601 Acute respiratory failure with hypoxia: Secondary | ICD-10-CM | POA: Diagnosis not present

## 2024-01-01 DIAGNOSIS — R262 Difficulty in walking, not elsewhere classified: Secondary | ICD-10-CM | POA: Diagnosis not present

## 2024-01-01 DIAGNOSIS — M6281 Muscle weakness (generalized): Secondary | ICD-10-CM | POA: Diagnosis not present

## 2024-01-03 ENCOUNTER — Ambulatory Visit: Admitting: Cardiology

## 2024-01-06 DIAGNOSIS — J9601 Acute respiratory failure with hypoxia: Secondary | ICD-10-CM | POA: Diagnosis not present

## 2024-01-06 DIAGNOSIS — M6281 Muscle weakness (generalized): Secondary | ICD-10-CM | POA: Diagnosis not present

## 2024-01-06 DIAGNOSIS — R262 Difficulty in walking, not elsewhere classified: Secondary | ICD-10-CM | POA: Diagnosis not present

## 2024-01-06 DIAGNOSIS — I5033 Acute on chronic diastolic (congestive) heart failure: Secondary | ICD-10-CM | POA: Diagnosis not present

## 2024-01-06 DIAGNOSIS — M4807 Spinal stenosis, lumbosacral region: Secondary | ICD-10-CM | POA: Diagnosis not present

## 2024-01-06 DIAGNOSIS — R41841 Cognitive communication deficit: Secondary | ICD-10-CM | POA: Diagnosis not present

## 2024-01-09 DIAGNOSIS — R262 Difficulty in walking, not elsewhere classified: Secondary | ICD-10-CM | POA: Diagnosis not present

## 2024-01-09 DIAGNOSIS — M6281 Muscle weakness (generalized): Secondary | ICD-10-CM | POA: Diagnosis not present

## 2024-01-09 DIAGNOSIS — I5033 Acute on chronic diastolic (congestive) heart failure: Secondary | ICD-10-CM | POA: Diagnosis not present

## 2024-01-09 DIAGNOSIS — J9601 Acute respiratory failure with hypoxia: Secondary | ICD-10-CM | POA: Diagnosis not present

## 2024-01-09 DIAGNOSIS — R41841 Cognitive communication deficit: Secondary | ICD-10-CM | POA: Diagnosis not present

## 2024-01-09 DIAGNOSIS — M4807 Spinal stenosis, lumbosacral region: Secondary | ICD-10-CM | POA: Diagnosis not present

## 2024-01-10 DIAGNOSIS — R41841 Cognitive communication deficit: Secondary | ICD-10-CM | POA: Diagnosis not present

## 2024-01-10 DIAGNOSIS — M4807 Spinal stenosis, lumbosacral region: Secondary | ICD-10-CM | POA: Diagnosis not present

## 2024-01-10 DIAGNOSIS — I5033 Acute on chronic diastolic (congestive) heart failure: Secondary | ICD-10-CM | POA: Diagnosis not present

## 2024-01-13 DIAGNOSIS — I5033 Acute on chronic diastolic (congestive) heart failure: Secondary | ICD-10-CM | POA: Diagnosis not present

## 2024-01-13 DIAGNOSIS — R41841 Cognitive communication deficit: Secondary | ICD-10-CM | POA: Diagnosis not present

## 2024-01-13 DIAGNOSIS — M4807 Spinal stenosis, lumbosacral region: Secondary | ICD-10-CM | POA: Diagnosis not present

## 2024-01-15 DIAGNOSIS — M4807 Spinal stenosis, lumbosacral region: Secondary | ICD-10-CM | POA: Diagnosis not present

## 2024-01-15 DIAGNOSIS — I5033 Acute on chronic diastolic (congestive) heart failure: Secondary | ICD-10-CM | POA: Diagnosis not present

## 2024-01-15 DIAGNOSIS — R41841 Cognitive communication deficit: Secondary | ICD-10-CM | POA: Diagnosis not present

## 2024-01-17 DIAGNOSIS — I5033 Acute on chronic diastolic (congestive) heart failure: Secondary | ICD-10-CM | POA: Diagnosis not present

## 2024-01-17 DIAGNOSIS — M4807 Spinal stenosis, lumbosacral region: Secondary | ICD-10-CM | POA: Diagnosis not present

## 2024-01-17 DIAGNOSIS — R41841 Cognitive communication deficit: Secondary | ICD-10-CM | POA: Diagnosis not present

## 2024-01-20 DIAGNOSIS — I5033 Acute on chronic diastolic (congestive) heart failure: Secondary | ICD-10-CM | POA: Diagnosis not present

## 2024-01-20 DIAGNOSIS — R41841 Cognitive communication deficit: Secondary | ICD-10-CM | POA: Diagnosis not present

## 2024-01-20 DIAGNOSIS — M4807 Spinal stenosis, lumbosacral region: Secondary | ICD-10-CM | POA: Diagnosis not present

## 2024-01-22 DIAGNOSIS — M4807 Spinal stenosis, lumbosacral region: Secondary | ICD-10-CM | POA: Diagnosis not present

## 2024-01-22 DIAGNOSIS — R41841 Cognitive communication deficit: Secondary | ICD-10-CM | POA: Diagnosis not present

## 2024-01-22 DIAGNOSIS — I5033 Acute on chronic diastolic (congestive) heart failure: Secondary | ICD-10-CM | POA: Diagnosis not present

## 2024-01-24 DIAGNOSIS — I5033 Acute on chronic diastolic (congestive) heart failure: Secondary | ICD-10-CM | POA: Diagnosis not present

## 2024-01-24 DIAGNOSIS — M4807 Spinal stenosis, lumbosacral region: Secondary | ICD-10-CM | POA: Diagnosis not present

## 2024-01-24 DIAGNOSIS — R41841 Cognitive communication deficit: Secondary | ICD-10-CM | POA: Diagnosis not present

## 2024-01-27 DIAGNOSIS — R41841 Cognitive communication deficit: Secondary | ICD-10-CM | POA: Diagnosis not present

## 2024-01-27 DIAGNOSIS — M4807 Spinal stenosis, lumbosacral region: Secondary | ICD-10-CM | POA: Diagnosis not present

## 2024-01-27 DIAGNOSIS — I5033 Acute on chronic diastolic (congestive) heart failure: Secondary | ICD-10-CM | POA: Diagnosis not present

## 2024-01-29 DIAGNOSIS — M4807 Spinal stenosis, lumbosacral region: Secondary | ICD-10-CM | POA: Diagnosis not present

## 2024-01-29 DIAGNOSIS — R41841 Cognitive communication deficit: Secondary | ICD-10-CM | POA: Diagnosis not present

## 2024-01-29 DIAGNOSIS — I5033 Acute on chronic diastolic (congestive) heart failure: Secondary | ICD-10-CM | POA: Diagnosis not present

## 2024-01-31 DIAGNOSIS — I5033 Acute on chronic diastolic (congestive) heart failure: Secondary | ICD-10-CM | POA: Diagnosis not present

## 2024-01-31 DIAGNOSIS — M4807 Spinal stenosis, lumbosacral region: Secondary | ICD-10-CM | POA: Diagnosis not present

## 2024-01-31 DIAGNOSIS — R41841 Cognitive communication deficit: Secondary | ICD-10-CM | POA: Diagnosis not present

## 2024-02-03 DIAGNOSIS — R41841 Cognitive communication deficit: Secondary | ICD-10-CM | POA: Diagnosis not present

## 2024-02-03 DIAGNOSIS — I5033 Acute on chronic diastolic (congestive) heart failure: Secondary | ICD-10-CM | POA: Diagnosis not present

## 2024-02-03 DIAGNOSIS — M4807 Spinal stenosis, lumbosacral region: Secondary | ICD-10-CM | POA: Diagnosis not present

## 2024-02-05 ENCOUNTER — Ambulatory Visit (INDEPENDENT_AMBULATORY_CARE_PROVIDER_SITE_OTHER): Payer: Medicare Other

## 2024-02-05 DIAGNOSIS — I5033 Acute on chronic diastolic (congestive) heart failure: Secondary | ICD-10-CM | POA: Diagnosis not present

## 2024-02-05 DIAGNOSIS — R41841 Cognitive communication deficit: Secondary | ICD-10-CM | POA: Diagnosis not present

## 2024-02-05 DIAGNOSIS — R55 Syncope and collapse: Secondary | ICD-10-CM

## 2024-02-05 DIAGNOSIS — M4807 Spinal stenosis, lumbosacral region: Secondary | ICD-10-CM | POA: Diagnosis not present

## 2024-02-06 ENCOUNTER — Ambulatory Visit: Payer: Self-pay | Admitting: Internal Medicine

## 2024-02-06 LAB — CUP PACEART REMOTE DEVICE CHECK
Battery Remaining Longevity: 128 mo
Battery Voltage: 3.02 V
Brady Statistic AP VP Percent: 0 %
Brady Statistic AP VS Percent: 0 %
Brady Statistic AS VP Percent: 89.69 %
Brady Statistic AS VS Percent: 10.31 %
Brady Statistic RA Percent Paced: 0 %
Brady Statistic RV Percent Paced: 89.69 %
Date Time Interrogation Session: 20250826203427
Implantable Lead Connection Status: 753985
Implantable Lead Connection Status: 753985
Implantable Lead Implant Date: 20230530
Implantable Lead Implant Date: 20230530
Implantable Lead Location: 753859
Implantable Lead Location: 753860
Implantable Lead Model: 3830
Implantable Lead Model: 5076
Implantable Pulse Generator Implant Date: 20230530
Lead Channel Impedance Value: 228 Ohm
Lead Channel Impedance Value: 323 Ohm
Lead Channel Impedance Value: 418 Ohm
Lead Channel Impedance Value: 437 Ohm
Lead Channel Pacing Threshold Amplitude: 0.875 V
Lead Channel Pacing Threshold Amplitude: 1.375 V
Lead Channel Pacing Threshold Pulse Width: 0.4 ms
Lead Channel Pacing Threshold Pulse Width: 0.4 ms
Lead Channel Sensing Intrinsic Amplitude: 0.125 mV
Lead Channel Sensing Intrinsic Amplitude: 0.25 mV
Lead Channel Sensing Intrinsic Amplitude: 9.375 mV
Lead Channel Sensing Intrinsic Amplitude: 9.375 mV
Lead Channel Setting Pacing Amplitude: 2 V
Lead Channel Setting Pacing Pulse Width: 0.4 ms
Lead Channel Setting Sensing Sensitivity: 1.2 mV
Zone Setting Status: 755011

## 2024-02-07 DIAGNOSIS — R41841 Cognitive communication deficit: Secondary | ICD-10-CM | POA: Diagnosis not present

## 2024-02-07 DIAGNOSIS — M4807 Spinal stenosis, lumbosacral region: Secondary | ICD-10-CM | POA: Diagnosis not present

## 2024-02-07 DIAGNOSIS — I5033 Acute on chronic diastolic (congestive) heart failure: Secondary | ICD-10-CM | POA: Diagnosis not present

## 2024-02-10 DIAGNOSIS — R41841 Cognitive communication deficit: Secondary | ICD-10-CM | POA: Diagnosis not present

## 2024-02-10 DIAGNOSIS — I5033 Acute on chronic diastolic (congestive) heart failure: Secondary | ICD-10-CM | POA: Diagnosis not present

## 2024-02-10 DIAGNOSIS — M4807 Spinal stenosis, lumbosacral region: Secondary | ICD-10-CM | POA: Diagnosis not present

## 2024-02-12 DIAGNOSIS — I5033 Acute on chronic diastolic (congestive) heart failure: Secondary | ICD-10-CM | POA: Diagnosis not present

## 2024-02-12 DIAGNOSIS — M4807 Spinal stenosis, lumbosacral region: Secondary | ICD-10-CM | POA: Diagnosis not present

## 2024-02-12 DIAGNOSIS — R41841 Cognitive communication deficit: Secondary | ICD-10-CM | POA: Diagnosis not present

## 2024-02-17 DIAGNOSIS — R41841 Cognitive communication deficit: Secondary | ICD-10-CM | POA: Diagnosis not present

## 2024-02-17 DIAGNOSIS — M4807 Spinal stenosis, lumbosacral region: Secondary | ICD-10-CM | POA: Diagnosis not present

## 2024-02-17 DIAGNOSIS — I5033 Acute on chronic diastolic (congestive) heart failure: Secondary | ICD-10-CM | POA: Diagnosis not present

## 2024-02-19 DIAGNOSIS — I5033 Acute on chronic diastolic (congestive) heart failure: Secondary | ICD-10-CM | POA: Diagnosis not present

## 2024-02-19 DIAGNOSIS — R41841 Cognitive communication deficit: Secondary | ICD-10-CM | POA: Diagnosis not present

## 2024-02-19 DIAGNOSIS — M4807 Spinal stenosis, lumbosacral region: Secondary | ICD-10-CM | POA: Diagnosis not present

## 2024-02-21 DIAGNOSIS — M4807 Spinal stenosis, lumbosacral region: Secondary | ICD-10-CM | POA: Diagnosis not present

## 2024-02-21 DIAGNOSIS — R41841 Cognitive communication deficit: Secondary | ICD-10-CM | POA: Diagnosis not present

## 2024-02-21 DIAGNOSIS — I5033 Acute on chronic diastolic (congestive) heart failure: Secondary | ICD-10-CM | POA: Diagnosis not present

## 2024-02-24 ENCOUNTER — Non-Acute Institutional Stay: Admitting: Adult Health

## 2024-02-24 ENCOUNTER — Encounter: Payer: Self-pay | Admitting: Adult Health

## 2024-02-24 VITALS — BP 138/62 | Temp 97.9°F | Ht 65.0 in | Wt 119.0 lb

## 2024-02-24 DIAGNOSIS — D539 Nutritional anemia, unspecified: Secondary | ICD-10-CM | POA: Diagnosis not present

## 2024-02-24 DIAGNOSIS — R197 Diarrhea, unspecified: Secondary | ICD-10-CM

## 2024-02-24 DIAGNOSIS — I459 Conduction disorder, unspecified: Secondary | ICD-10-CM | POA: Diagnosis not present

## 2024-02-24 DIAGNOSIS — I5032 Chronic diastolic (congestive) heart failure: Secondary | ICD-10-CM | POA: Insufficient documentation

## 2024-02-24 DIAGNOSIS — G629 Polyneuropathy, unspecified: Secondary | ICD-10-CM | POA: Diagnosis not present

## 2024-02-24 DIAGNOSIS — E782 Mixed hyperlipidemia: Secondary | ICD-10-CM | POA: Diagnosis not present

## 2024-02-24 DIAGNOSIS — I509 Heart failure, unspecified: Secondary | ICD-10-CM | POA: Insufficient documentation

## 2024-02-24 DIAGNOSIS — I10 Essential (primary) hypertension: Secondary | ICD-10-CM

## 2024-02-24 DIAGNOSIS — Q282 Arteriovenous malformation of cerebral vessels: Secondary | ICD-10-CM

## 2024-02-24 DIAGNOSIS — M4807 Spinal stenosis, lumbosacral region: Secondary | ICD-10-CM | POA: Diagnosis not present

## 2024-02-24 DIAGNOSIS — R41841 Cognitive communication deficit: Secondary | ICD-10-CM | POA: Diagnosis not present

## 2024-02-24 DIAGNOSIS — I5033 Acute on chronic diastolic (congestive) heart failure: Secondary | ICD-10-CM | POA: Diagnosis not present

## 2024-02-24 NOTE — Progress Notes (Signed)
 Remote PPM Transmission

## 2024-02-24 NOTE — Assessment & Plan Note (Signed)
 On torsemide  Followed by cardiology

## 2024-02-24 NOTE — Assessment & Plan Note (Signed)
 ON mg per cardiology  Check Mg level

## 2024-02-24 NOTE — Assessment & Plan Note (Signed)
 Lab Results  Component Value Date   LDLCALC 97 06/13/2023   Not currently on meds.

## 2024-02-24 NOTE — Patient Instructions (Addendum)
  VISIT SUMMARY: Today, you were seen for diarrhea that has been ongoing for three days, along with generalized weakness and some shoulder discomfort. We discussed your symptoms, possible causes, and the impact of your current medications and supplements. We also reviewed your history of diverticulosis and cholecystectomy, which may be contributing to your symptoms.  YOUR PLAN: -DIARRHEA: Diarrhea is the frequent passage of loose or watery stools. It can be caused by various factors including diet, medications, or changes after surgery. We will conduct blood work to check your magnesium  and electrolytes, and a stool study to rule out bacterial infection. You will be given Imodium  to help with the symptoms, and it's important to follow a bland diet and stay hydrated. We will also perform flu and COVID tests to rule out these infections.  -HEART FAILURE: Heart failure is a condition where the heart does not pump blood as well as it should. Your current management will continue, but we need to monitor you closely for dehydration, especially since you are taking diuretics, which can increase this risk during diarrhea episodes.  INSTRUCTIONS: Please follow up with the lab for blood work and stool study as soon as possible. Continue taking your current medications as prescribed. If your symptoms worsen or you experience new symptoms, contact our office immediately. Stay hydrated and follow a bland diet. We will notify you with the results of the flu and COVID tests.                      Contains text generated by [employer]bridge.                                 Contains text generated by [employer]bridge.

## 2024-02-24 NOTE — Assessment & Plan Note (Signed)
 Controlled.

## 2024-02-24 NOTE — Assessment & Plan Note (Signed)
 Numbness to feet, affecting gait Normal b12 Not diabetic No pain

## 2024-02-24 NOTE — Assessment & Plan Note (Signed)
 On phenobarbital  and dilantin  for seizure prevention

## 2024-02-24 NOTE — Assessment & Plan Note (Signed)
 S/p pacemaker

## 2024-02-24 NOTE — Progress Notes (Signed)
 Location:  [facility]  POS: Clinic  Provider: Tawni America, ANP   Goals of Care:     09/26/2023    2:48 PM  Advanced Directives  Would patient like information on creating a medical advance directive? No - Patient declined     Chief Complaint  Patient presents with   Follow-up    3 month follow up Patient has concerns about diarrhea for a couple days    HPI: Discussed the use of AI scribe software for clinical note transcription with the patient, who gave verbal consent to proceed.  History of Present Illness [name] is an 88 year old female who presents with diarrhea.  Diarrhea and gastrointestinal symptoms - Diarrhea for almost three days, occurring two to three times daily - Stool with abnormal consistency - Symptoms worsen after eating a muffin - History of intermittent diarrhea in the past  - No use of antidiarrheal medications such as Imodium  or Lomotil - Stopped eating normal food, tolerating only soup and liquids with minimal solid food intake - CT scan in May showed diverticulosis without acute abnormalities  Generalized weakness and constitutional symptoms - Associated weakness with diarrhea - No fever, sore throat, or body aches -covid and flu negative 02/24/2024   Surgical history - History of cholecystectomy  PMH significant for syncope and pacemaker, chronic left effusion, CHF, anemia, cerebral AV malformation on seizures preventions meds , afib not on doac due to the above, off amiodarone   due to GI s/e, diarrhea, s/p cholecystectomy, neuropathy , prior pericarditis treated with colchicine   Hospitalized from April 16th to April 18th for an acute kidney injury and possible pneumonia. During her hospitalization, a chest x-ray showed a left retrocardiac opacity, which was treated as pneumonia with Levaquin . Recurrent left pleural effusion evaluated by CT of the chest 11/13/23 no acute findings cardiomegaly and small left effusion present. CT  of the abd due to weight loss 11/13/23 no acute findings.  Neuropathy in her feet affecting her gait and  motor task Macrocytic anemia peripheral smear showed macrocytic anemia with no significant morphologies B12 700, good iron  stores MCI with memory loss MMSE 26/30 09/2023 Cerebral AV malformation on phenobarbital  and dilantin  for seizure prevention CHF dilated atria, EF 60-65%, severe pulmonary htn with moderate pericardial effusion (treated with colchicine ) Stokes adams attack s/p pacemaker   Bone density: ordered and pending.  Mammogram: aged out  Past Medical History:  Diagnosis Date   Abnormal cardiovascular stress test    Carotid arterial disease (HCC)    MODERATE RIGHT   Cerebral AV malformation    DDD (degenerative disc disease)    SEVERE WITH SPINAL STENOSIS   Hypercholesterolemia    Hypertension    MVP (mitral valve prolapse)    MILD   Numbness    LEFT FOOT AND ANKLE   Osteopenia    Presence of permanent cardiac pacemaker    Pseudo-gout     Past Surgical History:  Procedure Laterality Date   APPENDECTOMY     BREAST MASS EXCISION     CARPAL TUNNEL RELEASE     CHOLECYSTECTOMY N/A 04/23/2022   Procedure: LAPAROSCOPIC CHOLECYSTECTOMY;  Surgeon: Vernetta Berg, MD;  Location: [facility];  Service: General;  Laterality: N/A;   gallbladder     PACEMAKER IMPLANT N/A 11/07/2021   Procedure: PACEMAKER IMPLANT;  Surgeon: Waddell Danelle ORN, MD;  Location: [facility];  Service: Cardiovascular;  Laterality: N/A;   REMOVAL OF PARATHYROID  GLAND     STRESS NUCLEAR STUDY  ABNORMAL   TONSILLECTOMY      Allergies  Allergen Reactions   Aleve [Naproxen] Other (See Comments)   Azithromycin Other (See Comments)    Reaction not recalled   Penicillins Hives and Other (See Comments)    Bad reaction as a child after a shot of Penicillin   Pravastatin Other (See Comments)    Reaction not recalled   Sulfa Antibiotics Other (See Comments)   Sulfa Drugs Cross Reactors Other  (See Comments)    Reaction not recalled- stated she can take it in small doses   Tape Itching and Other (See Comments)    Cannot wear for an extended period of time   Naproxen Sodium Rash    Outpatient Encounter Medications as of 02/24/2024  Medication Sig   acetaminophen  (TYLENOL ) 500 MG tablet Take 1 tablet (500 mg total) by mouth every 6 (six) hours as needed for mild pain (pain score 1-3) or headache.   amLODipine  (NORVASC ) 5 MG tablet Take 1 tablet (5 mg total) by mouth daily.   b complex vitamins capsule Take 1 capsule by mouth daily.   fluticasone  (FLONASE ) 50 MCG/ACT nasal spray Place 1-2 sprays into both nostrils daily.   guaiFENesin -dextromethorphan (ROBITUSSIN DM) 100-10 MG/5ML syrup Take 10 mLs by mouth every 6 (six) hours as needed for cough.   ipratropium-albuterol  (DUONEB) 0.5-2.5 (3) MG/3ML SOLN Take 3 mLs by nebulization every 6 (six) hours as needed (SOB/Wheezing).   Iron , Ferrous Sulfate , 325 (65 Fe) MG TABS Take 325 mg by mouth 3 (three) times a week.   magnesium  oxide (MAG-OX) 400 (240 Mg) MG tablet Take 400 mg by mouth 2 (two) times daily.   Nutritional Supplements (BOOST ORIGINAL PO) Take by mouth as directed. Give 1 can orally one time a day   ondansetron  (ZOFRAN -ODT) 4 MG disintegrating tablet Take 2 mg by mouth every 4 (four) hours as needed for nausea or vomiting.   PHENobarbital  (LUMINAL) 64.8 MG tablet Take 1 tablet (64.8 mg total) by mouth daily. Take 1 tablet every morning   phenytoin  (DILANTIN ) 100 MG ER capsule Take 200 mg by mouth daily.   Polyethyl Glycol-Propyl Glycol (SYSTANE) 0.4-0.3 % SOLN Place 1 drop into both eyes in the morning and at bedtime.   torsemide  (DEMADEX ) 10 MG tablet Take 1 tablet (10 mg total) by mouth 3 (three) times a week.   torsemide  (DEMADEX ) 20 MG tablet Take 1 tablet (20 mg total) by mouth daily as needed. For weight gain 3lbs in a day 5 lbs in a week   triamcinolone  cream (KENALOG ) 0.1 % Apply 1 Application topically 2 (two)  times daily as needed.   valsartan  (DIOVAN ) 160 MG tablet Take 1 tablet (160 mg total) by mouth 2 (two) times daily.   No facility-administered encounter medications on file as of 02/24/2024.    Review of Systems:  Review of Systems  Constitutional:  Positive for fatigue. Negative for activity change, appetite change, chills, diaphoresis and fever.  HENT:  Negative for congestion.   Respiratory:  Negative for cough, shortness of breath and wheezing.   Cardiovascular:  Positive for leg swelling. Negative for chest pain.  Gastrointestinal:  Positive for diarrhea. Negative for abdominal distention, abdominal pain, constipation, nausea and vomiting.  Genitourinary:  Negative for difficulty urinating, dysuria and urgency.  Musculoskeletal:  Positive for arthralgias (chronic shoulder pain) and gait problem. Negative for back pain, myalgias and neck pain.  Skin:  Negative for rash.  Neurological:  Negative for dizziness and weakness.  Psychiatric/Behavioral:  Negative for confusion.        Memory loss    Health Maintenance  Topic Date Due   DEXA SCAN  Never done   Influenza Vaccine  04/15/2024 (Originally 01/10/2024)   COVID-19 Vaccine (7 - 2025-26 season) 04/16/2024 (Originally 02/10/2024)   Medicare Annual Wellness (AWV)  08/04/2024   Pneumococcal Vaccine: 50+ Years  Completed   Zoster Vaccines- Shingrix  Completed   HPV VACCINES  Aged Out   Meningococcal B Vaccine  Aged Out   DTaP/Tdap/Td  Discontinued    Physical Exam: Vitals:   02/24/24 1221  BP: 138/62  Temp: 97.9 F (36.6 C)  Weight: 119 lb (54 kg)  Height: 5' 5 (1.651 m)   Body mass index is 19.8 kg/m. Physical Exam Vitals reviewed.  Constitutional:      General: She is not in acute distress.    Appearance: She is not diaphoretic.  HENT:     Head: Normocephalic and atraumatic.     Right Ear: Tympanic membrane normal.     Left Ear: Tympanic membrane normal.     Nose: Congestion present.     Mouth/Throat:     Mouth:  Mucous membranes are moist.     Pharynx: Oropharynx is clear.  Eyes:     General:        Right eye: No discharge.        Left eye: No discharge.     Conjunctiva/sclera: Conjunctivae normal.     Pupils: Pupils are equal, round, and reactive to light.  Neck:     Vascular: No JVD.  Cardiovascular:     Rate and Rhythm: Normal rate and regular rhythm.     Heart sounds: No murmur heard. Pulmonary:     Effort: Pulmonary effort is normal. No respiratory distress.     Breath sounds: Normal breath sounds. No wheezing.  Abdominal:     General: Abdomen is flat. There is no distension.     Palpations: Abdomen is soft.     Tenderness: There is no abdominal tenderness. There is no right CVA tenderness, left CVA tenderness or guarding.     Comments: Hyperactive x 4  Skin:    General: Skin is warm and dry.  Neurological:     Mental Status: She is alert and oriented to person, place, and time.  Psychiatric:        Mood and Affect: Mood normal.     Labs reviewed: Basic Metabolic Panel: Recent Labs    09/25/23 1245 09/26/23 0448 09/27/23 0508 09/30/23 0000 10/18/23 0000 11/14/23 0000 12/17/23 0000  NA 134* 136 138   < > 138 140 138  K 3.8 3.8 3.9   < > 4.4 4.5 4.5  CL 98 102 107   < > 102 104 104  CO2 22 21* 22   < > 25* 22 24*  GLUCOSE 114* 91 90  --   --   --   --   BUN 119* 105* 86*   < > 39* 62* 38*  CREATININE 2.14* 1.72* 1.44*   < > 1.3* 1.6* 1.2*  CALCIUM  8.9 8.8* 8.6*   < > 8.8 9.1 8.6*  TSH  --   --   --   --   --  4.05  --    < > = values in this interval not displayed.   Liver Function Tests: Recent Labs    06/13/23 0000 10/18/23 0000 11/14/23 0000  AST 30  --  24  ALT 21  --  14  ALKPHOS 74  --  86  ALBUMIN 3.5 3.2* 3.6   No results for input(s): LIPASE, AMYLASE in the last 8760 hours. No results for input(s): AMMONIA in the last 8760 hours. CBC: Recent Labs    09/25/23 1245 09/25/23 2354 09/26/23 0448 09/30/23 0000 10/18/23 0000 11/14/23 0000   WBC 10.2 9.0 9.4 7.7 5.8 4.2  NEUTROABS  --   --  7.2  --  4.10  --   HGB 11.0* 10.8* 10.7* 9.4* 8.0* 9.5*  HCT 32.6* 32.1* 30.8* 28* 23* 28*  MCV 97.6 97.3 96.9  --   --   --   PLT 262 188 264 245 274 225   Lipid Panel: Recent Labs    06/13/23 0000  HDL 97*  LDLCALC 97  TRIG 51   Lab Results  Component Value Date   HGBA1C 204 06/13/2023    Procedures since last visit: CUP PACEART REMOTE DEVICE CHECK Result Date: 02/06/2024 PPM Scheduled remote reviewed. Normal device function.  Presenting rhythm: mostly VP with PVCs. 1 NSVT event x 1 sec, V-rate 162 bpm.  Likely ventricular driven. Next remote 91 days. [place], CVRS   Assessment/Plan Assessment and Plan Assessment & Plan Diarrhea in the setting of diverticulosis and cholecystectomy Diarrhea for three days without fever or abdominal pain. Possible causes include dietary factors, IBS or post-cholecystectomy changes. Risk of dehydration due to diuretic use for heart failure. - Order blood work for magnesium  and electrolytes. - Order stool study for bacterial infection, O and P, cdiff - Administer Imodium  for symptomatic relief. - Advise bland diet and hydration. - covid and flu neg  Stokes Adams attack S/p pacemaker  Mixed hyperlipidemia Lab Results  Component Value Date   LDLCALC 97 06/13/2023   Not currently on meds.   Macrocytic anemia Lab Results  Component Value Date   HGB 9.5 (A) 11/14/2023   Monitor Hgb Hx of anemia of chronic disease.  B12 and iron  stores ok   Hypomagnesemia ON mg per cardiology  Check Mg level   Essential hypertension Controlled   Cerebral AV malformation On phenobarbital  and dilantin  for seizure prevention  Neuropathy Numbness to feet, affecting gait Normal b12 Not diabetic No pain  Congestive heart failure (CHF) (HCC) On torsemide  Followed by cardiology       Labs/tests ordered:  * No order type specified *CBC CMP MG in am, stool studies.  Next appt:  RTC if not  improving, has f/u in 3 months.    Total time :  time greater than 50% of total time spent doing pt counseling and coordination of care

## 2024-02-24 NOTE — Assessment & Plan Note (Signed)
 Lab Results  Component Value Date   HGB 9.5 (A) 11/14/2023   Monitor Hgb Hx of anemia of chronic disease.  B12 and iron  stores ok

## 2024-02-25 ENCOUNTER — Ambulatory Visit: Admitting: Cardiology

## 2024-02-25 DIAGNOSIS — R197 Diarrhea, unspecified: Secondary | ICD-10-CM | POA: Diagnosis not present

## 2024-02-26 DIAGNOSIS — R197 Diarrhea, unspecified: Secondary | ICD-10-CM | POA: Diagnosis not present

## 2024-02-26 DIAGNOSIS — M4807 Spinal stenosis, lumbosacral region: Secondary | ICD-10-CM | POA: Diagnosis not present

## 2024-02-26 DIAGNOSIS — R41841 Cognitive communication deficit: Secondary | ICD-10-CM | POA: Diagnosis not present

## 2024-02-26 DIAGNOSIS — I5033 Acute on chronic diastolic (congestive) heart failure: Secondary | ICD-10-CM | POA: Diagnosis not present

## 2024-02-27 DIAGNOSIS — R197 Diarrhea, unspecified: Secondary | ICD-10-CM | POA: Diagnosis not present

## 2024-02-28 DIAGNOSIS — R41841 Cognitive communication deficit: Secondary | ICD-10-CM | POA: Diagnosis not present

## 2024-02-28 DIAGNOSIS — M4807 Spinal stenosis, lumbosacral region: Secondary | ICD-10-CM | POA: Diagnosis not present

## 2024-02-28 DIAGNOSIS — I5033 Acute on chronic diastolic (congestive) heart failure: Secondary | ICD-10-CM | POA: Diagnosis not present

## 2024-03-03 DIAGNOSIS — M4807 Spinal stenosis, lumbosacral region: Secondary | ICD-10-CM | POA: Diagnosis not present

## 2024-03-03 DIAGNOSIS — I5033 Acute on chronic diastolic (congestive) heart failure: Secondary | ICD-10-CM | POA: Diagnosis not present

## 2024-03-03 DIAGNOSIS — E860 Dehydration: Secondary | ICD-10-CM | POA: Diagnosis not present

## 2024-03-03 DIAGNOSIS — R41841 Cognitive communication deficit: Secondary | ICD-10-CM | POA: Diagnosis not present

## 2024-03-06 DIAGNOSIS — R41841 Cognitive communication deficit: Secondary | ICD-10-CM | POA: Diagnosis not present

## 2024-03-06 DIAGNOSIS — M4807 Spinal stenosis, lumbosacral region: Secondary | ICD-10-CM | POA: Diagnosis not present

## 2024-03-06 DIAGNOSIS — I5033 Acute on chronic diastolic (congestive) heart failure: Secondary | ICD-10-CM | POA: Diagnosis not present

## 2024-03-09 DIAGNOSIS — I5033 Acute on chronic diastolic (congestive) heart failure: Secondary | ICD-10-CM | POA: Diagnosis not present

## 2024-03-09 DIAGNOSIS — R41841 Cognitive communication deficit: Secondary | ICD-10-CM | POA: Diagnosis not present

## 2024-03-09 DIAGNOSIS — M4807 Spinal stenosis, lumbosacral region: Secondary | ICD-10-CM | POA: Diagnosis not present

## 2024-03-10 DIAGNOSIS — Z23 Encounter for immunization: Secondary | ICD-10-CM | POA: Diagnosis not present

## 2024-03-15 NOTE — Progress Notes (Unsigned)
 Office Visit    Patient Name: [name] Date of Encounter: 03/16/2024  Primary Care Provider:  [name], MD Primary Cardiologist:  Arrabella Westerman [place], MD  Chief Complaint    88 year old female with a history of recurrent syncope (Stokes-Adams attack), complete heart block s/p PPM, RBBB, paroxysmal atrial fibrillation, mitral valve prolapse, hypertension, hyperlipidemia, carotid artery disease, DDD, cerebral AV malformation, complex partial seizures, memory impairment, iron  deficiency anemia, and osteopenia who presents for follow-up related to heart block, hypertension, and atrial fibrillation.   Past Medical History    Past Medical History:  Diagnosis Date   Abnormal cardiovascular stress test    Carotid arterial disease    MODERATE RIGHT   Cerebral AV malformation    DDD (degenerative disc disease)    SEVERE WITH SPINAL STENOSIS   Hypercholesterolemia    Hypertension    MVP (mitral valve prolapse)    MILD   Numbness    LEFT FOOT AND ANKLE   Osteopenia    Presence of permanent cardiac pacemaker    Pseudo-gout    Past Surgical History:  Procedure Laterality Date   APPENDECTOMY     BREAST MASS EXCISION     CARPAL TUNNEL RELEASE     CHOLECYSTECTOMY N/A 04/23/2022   Procedure: LAPAROSCOPIC CHOLECYSTECTOMY;  Surgeon: Vernetta Berg, MD;  Location: [facility];  Service: General;  Laterality: N/A;   gallbladder     PACEMAKER IMPLANT N/A 11/07/2021   Procedure: PACEMAKER IMPLANT;  Surgeon: Waddell Danelle ORN, MD;  Location: [facility];  Service: Cardiovascular;  Laterality: N/A;   REMOVAL OF PARATHYROID  GLAND     STRESS NUCLEAR STUDY     ABNORMAL   TONSILLECTOMY      Allergies  Allergies  Allergen Reactions   Aleve [Naproxen] Other (See Comments)   Azithromycin Other (See Comments)    Reaction not recalled   Penicillins Hives and Other (See Comments)    Bad reaction as a child after a shot of Penicillin   Pravastatin Other (See Comments)     Reaction not recalled   Sulfa Antibiotics Other (See Comments)   Sulfa Drugs Cross Reactors Other (See Comments)    Reaction not recalled- stated she can take it in small doses   Tape Itching and Other (See Comments)    Cannot wear for an extended period of time   Naproxen Sodium Rash    History of Present Illness    88 year old female with the above past medical history including recurrent syncope (Stokes-Adams attack), complete heart block s/p PPM, RBBB, paroxysmal atrial fibrillation, mitral valve prolapse, hypertension, hyperlipidemia, carotid artery disease, DDD, cerebral AV malformation, complex partial seizures, memory impairment, iron  deficiency anemia, and osteopenia.  She has a history of mildly abnormal Myoview  study in 2010.  Not on ASA due to history of cerebral AV malformation.  She lives at [facility].  She history of recurrent syncope.  She was hospitalized in February 2022 with syncope, confusion, hyponatremia following diarrheal illness.  Carotid Dopplers in July 2022 showed 40 to 59% B ICA stenosis, stable from prior study.   She presented to the ED on 10/15/2021 with syncope.  Echocardiogram showed EF 45 to 50%, LVH, normal RV, moderately dilated left, mild MR, pericardial effusion.  Telemetry monitoring showed frequent PVCs.  Radiology and neurology were consulted.  EEG was negative for seizure activity, UA was negative for UTI.  It was mildly elevated with low suspicion for ACS.  She was hospitalized from 10/15/2021 to  10/17/2021.  She was discharged to assisted living with outpatient 14-day live Zio patch revealed intermittent CHB, says lasting up to 10 seconds.  She was last in the office on 11/03/2021 by Dr. Waddell who recommended PPM for complete heart block.  She underwent PPM implantation on 11/07/2021 with Dr. Waddell.  She was discharged to skilled nursing facility on 11/07/2021.  Subsequent  PPM remote transmission on 11/14/2021 alerted for new onset atrial  fibrillation.   Was seen in Afib clinic in July. Not a candidate for anticoagulation due to cerebral AVMs. Also not a candidate for Watchman device since she cannot take any antiplatelet or anticoagulants. She was  admitted 8/19 with pleuritic chest pain. Ruled out for MI. Coronary CTA showed nonobstructive CAD. There was a moderate pericardial effusion noted and this was confirmed on Echo. Normal pacemaker parameters suggested no lead migration. She was treated for pericarditis with colchicine .   She was seen by Dr Waddell EPS in September 2023 who recommended a trial of amiodarone  for her Afib.  She noted persistent nausea, diarrhea and poor appetite. Amiodarone  was discontinued and she was managed with rate control.  She underwent lap chole on 04/23/2022. Postoperatively she was noted to have a seizure.  She was seen by Josefa Beauvais in March 2024 with LE edema. Echo repeated and showed severe biatrial enlargement, severe pulmonary HTN and moderate pericardial effusion. She was managed with lasix , compression hose. She was seen in July and clinically improved.   On follow up today she is doing ok. Notes swelling is controlled with compression hose and elevation. Takes diuretic 3 days/week. BP is typically high in am but comes down after morning meds. Has a wide pulse pressure with low diastolic readings. Denies chest pain or SOB. Notes she is getting old.   Home Medications    Current Outpatient Medications  Medication Sig Dispense Refill   acetaminophen  (TYLENOL ) 500 MG tablet Take 1 tablet (500 mg total) by mouth every 6 (six) hours as needed for mild pain (pain score 1-3) or headache. 30 tablet 0   amLODipine  (NORVASC ) 5 MG tablet Take 1 tablet (5 mg total) by mouth daily.     b complex vitamins capsule Take 1 capsule by mouth daily.     fluticasone  (FLONASE ) 50 MCG/ACT nasal spray Place 1-2 sprays into both nostrils daily.     guaiFENesin -dextromethorphan (ROBITUSSIN DM) 100-10 MG/5ML syrup  Take 10 mLs by mouth every 6 (six) hours as needed for cough. 118 mL 0   ipratropium-albuterol  (DUONEB) 0.5-2.5 (3) MG/3ML SOLN Take 3 mLs by nebulization every 6 (six) hours as needed (SOB/Wheezing).     Iron , Ferrous Sulfate , 325 (65 Fe) MG TABS Take 325 mg by mouth 3 (three) times a week.     magnesium  oxide (MAG-OX) 400 (240 Mg) MG tablet Take 400 mg by mouth 2 (two) times daily.     Nutritional Supplements (BOOST ORIGINAL PO) Take by mouth as directed. Give 1 can orally one time a day     ondansetron  (ZOFRAN -ODT) 4 MG disintegrating tablet Take 2 mg by mouth every 4 (four) hours as needed for nausea or vomiting.     phenytoin  (DILANTIN ) 100 MG ER capsule Take 200 mg by mouth daily.     Polyethyl Glycol-Propyl Glycol (SYSTANE) 0.4-0.3 % SOLN Place 1 drop into both eyes in the morning and at bedtime.     torsemide  (DEMADEX ) 10 MG tablet Take 1 tablet (10 mg total) by mouth 3 (three) times a week.  triamcinolone  cream (KENALOG ) 0.1 % Apply 1 Application topically 2 (two) times daily as needed.     valsartan  (DIOVAN ) 160 MG tablet Take 1 tablet (160 mg total) by mouth 2 (two) times daily. 60 tablet 0   No current facility-administered medications for this visit.     Review of Systems    She denies chest pain, palpitations, dyspnea, pnd, orthopnea, n, v, dizziness, syncope, edema, weight gain, or early satiety. All other systems reviewed and are otherwise negative except as noted above.   Physical Exam    VS:  BP (!) 190/66 (BP Location: Left Arm, Patient Position: Sitting, Cuff Size: Normal)   Pulse (!) 58   Resp 16   Ht 5' 5 (1.651 m)   Wt 125 lb (56.7 kg)   SpO2 96%   BMI 20.80 kg/m  repeat BP 180/48 GEN: Well nourished, well developed, in no acute distress. HEENT: normal. Neck: Supple, no JVD, bilateral carotid bruits, no masses. Cardiac: RRR, no murmurs, rubs, or gallops. No clubbing, cyanosis, edema.  Radials/DP/PT 2+ and equal bilaterally.  Respiratory:  Respirations  regular and unlabored, clear to auscultation bilaterally. GI: Soft, nontender, nondistended, BS + x 4. MS: no deformity or atrophy. Skin: warm and dry, no rash. Neuro:  Strength and sensation are intact. Psych: Normal affect.  Accessory Clinical Findings    ECG is not done today.  Lab Results  Component Value Date   WBC 4.2 11/14/2023   HGB 9.5 (A) 11/14/2023   HCT 28 (A) 11/14/2023   MCV 96.9 09/26/2023   PLT 225 11/14/2023   Lab Results  Component Value Date   CREATININE 1.2 (A) 12/17/2023   BUN 38 (A) 12/17/2023   NA 138 12/17/2023   K 4.5 12/17/2023   CL 104 12/17/2023   CO2 24 (A) 12/17/2023   Lab Results  Component Value Date   ALT 14 11/14/2023   AST 24 11/14/2023   ALKPHOS 86 11/14/2023   BILITOT 0.9 04/26/2022   Lab Results  Component Value Date   CHOL 208 (H) 01/28/2022   HDL 97 (A) 06/13/2023   LDLCALC 97 06/13/2023   TRIG 51 06/13/2023   CHOLHDL 2.6 01/28/2022    Lab Results  Component Value Date   HGBA1C 204 06/13/2023   Dated 03/03/24: BUN 56, creatinine 1.37. potassium 5.3. otherwise CMET.   Coronary CTA 01/27/22   __Coronary Arteries:  Normal coronary origin.  Right dominance.   RCA is a large dominant artery that gives rise to PDA and PLA. Calcified plaque in ostial RCA causes 0-24% stenosis. Calcified plaque in proximal RCA causes 25-49% stenosis.   Left main is a large artery that gives rise to LAD and LCX arteries. Calcified plaque in ostial left main causes 0-24% stenosis   LAD is a large vessel. Calcified plaque in proximal LAD causes 0-24% stenosis.   LCX is a non-dominant artery that gives rise to one large OM1 branch. There is no plaque.   Other findings:   Left Ventricle: Normal size   Left Atrium: Mild enlargement   Pulmonary Veins: Normal configuration   Right Ventricle: Normal size   Right Atrium: Normal size.  Dual chamber pacemaker   Thoracic aorta: Normal size   Pulmonary Arteries: Normal size   Systemic  Veins: Normal drainage   Pericardium: Moderate pericardial effusion, primarily located adjacent to the posterior/lateral walls of the LV   IMPRESSION: 1. Technically difficult study due to multiple PVCs during acquisition. With ECG editing study is largely interpretable, though  distal RCA remains uninterpretable   2. Coronary calcium  score of 329. This was 67th percentile for age and sex matched control.   3. Normal coronary origin with right dominance.   4. Nonobstructive CAD   5. Calcified plaque in proximal RCA causes mild (25-49%) stenosis.   6. Calcified plaque causes minimal (0-24%) stenosis in ostial left main, ostial RCA, and proximal LAD   7. Moderate pericardial effusion, primarily located adjacent to the posterior/lateral walls of the LV   OVER Read by Radiologist      IMPRESSION: Cardiomegaly.  Small pericardial effusion.   Left lower lobe atelectasis or scarring.   Aortic atherosclerosis.     Echo 01/28/22 IMPRESSIONS     1. Left ventricular ejection fraction, by estimation, is 50 to 55%. The  left ventricle has low normal function. The left ventricle has no regional  wall motion abnormalities. Indeterminate diastolic filling due to E-A  fusion.   2. Right ventricular systolic function is normal. The right ventricular  size is normal. There is mildly elevated pulmonary artery systolic  pressure.   3. Left atrial size was mildly dilated.   4. Right atrial size was mildly dilated.   5. Moderate pericardial effusion. The pericardial effusion is  circumferential. There is no evidence of cardiac tamponade.   6. The mitral valve is normal in structure. Mild mitral valve  regurgitation.   7. The aortic valve is grossly normal. Aortic valve regurgitation is not  visualized. No aortic stenosis is present.   8. The inferior vena cava is normal in size with greater than 50%  respiratory variability, suggesting right atrial pressure of 3 mmHg.    Comparison(s): Prior images reviewed side by side. The pericardial  effusion is larger.   FINDINGS   Left Ventricle: Left ventricular ejection fraction, by estimation, is 50  to 55%. The left ventricle has low normal function. The left ventricle has  no regional wall motion abnormalities. The left ventricular internal  cavity size was normal in size.  There is no left ventricular hypertrophy. Abnormal (paradoxical) septal  motion, consistent with RV pacemaker. Indeterminate diastolic filling due  to E-A fusion.   Right Ventricle: The right ventricular size is normal. No increase in  right ventricular wall thickness. Right ventricular systolic function is  normal. There is mildly elevated pulmonary artery systolic pressure. The  tricuspid regurgitant velocity is 3.04   m/s, and with an assumed right atrial pressure of 3 mmHg, the estimated  right ventricular systolic pressure is 40.0 mmHg.   Left Atrium: Left atrial size was mildly dilated.   Right Atrium: Right atrial size was mildly dilated.   Pericardium: A moderately sized pericardial effusion is present. The  pericardial effusion is circumferential. There is no evidence of cardiac  tamponade.   Mitral Valve: The mitral valve is normal in structure. Mild mitral valve  regurgitation. MV peak gradient, 15.2 mmHg. The mean mitral valve gradient  is 4.0 mmHg.   Tricuspid Valve: The tricuspid valve is normal in structure. Tricuspid  valve regurgitation is not demonstrated.   Aortic Valve: The aortic valve is grossly normal. Aortic valve  regurgitation is not visualized. No aortic stenosis is present.   Pulmonic Valve: The pulmonic valve was normal in structure. Pulmonic valve  regurgitation is trivial.   Aorta: The aortic root is normal in size and structure.   Venous: The inferior vena cava is normal in size with greater than 50%  respiratory variability, suggesting right atrial pressure of 3 mmHg.  IAS/Shunts: No  atrial level shunt detected by color flow Doppler.   ___________   Echo 02/13/22: IMPRESSIONS     1. Moderate pericardial effusion. Unchanged from prior study. Moderate  pericardial effusion. The pericardial effusion is circumferential. There  is no evidence of cardiac tamponade.   2. Left ventricular ejection fraction, by estimation, is 55 to 60%. The  left ventricle has normal function. The left ventricle has no regional  wall motion abnormalities.   3. Right ventricular systolic function is normal. The right ventricular  size is normal. Tricuspid regurgitation signal is inadequate for assessing  PA pressure.   4. The mitral valve is myxomatous. Mild mitral valve regurgitation.   5. The inferior vena cava is normal in size with greater than 50%  respiratory variability, suggesting right atrial pressure of 3 mmHg.   Comparison(s): No significant change from prior study. 01/28/22 EF 50-55%.  Moderate pericardial effusion.   Echocardiogram 08/23/2022   IMPRESSIONS     1. Left ventricular ejection fraction, by estimation, is 60 to 65%. The  left ventricle has normal function. The left ventricle has no regional  wall motion abnormalities. There is mild concentric left ventricular  hypertrophy. Left ventricular diastolic  function could not be evaluated. Elevated left ventricular end-diastolic  pressure.   2. Right ventricular systolic function is normal. The right ventricular  size is normal. There is severely elevated pulmonary artery systolic  pressure.   3. Left atrial size was severely dilated.   4. Right atrial size was severely dilated.   5. Mostly posterior pericardial effusion measuring 1.77 cm. Compared with  the echo 02/2022, the effusion is larger. No evidence of tamponade.  Moderate pericardial effusion. There is no evidence of cardiac tamponade.  Moderate pleural effusion in the left  lateral region.   6. The mitral valve is normal in structure. Trivial mitral valve   regurgitation. No evidence of mitral stenosis.   7. The aortic valve is tricuspid. Aortic valve regurgitation is not  visualized. No aortic stenosis is present.   8. The inferior vena cava is normal in size with <50% respiratory  variability, suggesting right atrial pressure of 8 mmHg.    Assessment & Plan    1. Recurrent syncope/CHB: S/p PPM 11/07/2021.  She denies any recurrent syncope.  Pacer followed by EP  2.  Paroxysmal  atrial fibrillation: Rate controlled with Afib. She is completely asymptomatic. Did not tolerate amiodarone  before. Continue with rate control strategy. Unfortunately, she is not a candidate for DOAC at this time given history of cerebral AV malformation.  Also not a candidate for Watchman device.   3. Chronic  moderate pericardial effusion noted on CT and Echo. No evidence of hemodynamic compromise. No symptoms   5. Carotid artery disease: Carotid Dopplers in July 2022 showed 40 to 59% B ICA stenosis, stable from prior study.  Bilateral carotid bruits on exam.  Asymptomatic.  No indication for repeat study at this time.  6. Hypertension: high systolic readings but with wide pulse pressure and low diastolic readings. Given history of syncope and low diastolic BP would be pretty liberal with BP control.  7. Hyperlipidemia: LDL was 116 Continue simvastatin .  8. Partial symptomatic epilepsy with complex partial seizures: Managed on Dilantin , and phenobarbital .  Stable.  10.  Disposition: follow up in 6 months   Kameron Blethen [place], MD,FACC 03/16/2024, 4:32 PM

## 2024-03-16 ENCOUNTER — Encounter: Payer: Self-pay | Admitting: Cardiology

## 2024-03-16 ENCOUNTER — Ambulatory Visit: Attending: Cardiology | Admitting: Cardiology

## 2024-03-16 VITALS — BP 190/66 | HR 58 | Resp 16 | Ht 65.0 in | Wt 125.0 lb

## 2024-03-16 DIAGNOSIS — I442 Atrioventricular block, complete: Secondary | ICD-10-CM | POA: Insufficient documentation

## 2024-03-16 DIAGNOSIS — J9601 Acute respiratory failure with hypoxia: Secondary | ICD-10-CM | POA: Diagnosis not present

## 2024-03-16 DIAGNOSIS — M4807 Spinal stenosis, lumbosacral region: Secondary | ICD-10-CM | POA: Diagnosis not present

## 2024-03-16 DIAGNOSIS — I5033 Acute on chronic diastolic (congestive) heart failure: Secondary | ICD-10-CM | POA: Diagnosis not present

## 2024-03-16 DIAGNOSIS — I3139 Other pericardial effusion (noninflammatory): Secondary | ICD-10-CM | POA: Insufficient documentation

## 2024-03-16 DIAGNOSIS — Z95811 Presence of heart assist device: Secondary | ICD-10-CM | POA: Insufficient documentation

## 2024-03-16 DIAGNOSIS — R41841 Cognitive communication deficit: Secondary | ICD-10-CM | POA: Diagnosis not present

## 2024-03-16 DIAGNOSIS — Z950 Presence of cardiac pacemaker: Secondary | ICD-10-CM | POA: Diagnosis not present

## 2024-03-16 DIAGNOSIS — I10 Essential (primary) hypertension: Secondary | ICD-10-CM | POA: Insufficient documentation

## 2024-03-16 DIAGNOSIS — I4821 Permanent atrial fibrillation: Secondary | ICD-10-CM | POA: Diagnosis not present

## 2024-03-16 NOTE — Patient Instructions (Signed)
 Medication Instructions:  Continue same medications *If you need a refill on your cardiac medications before your next appointment, please call your pharmacy*  Lab Work: None ordered  Testing/Procedures: None ordered  Follow-Up: At [facility], you and your health needs are our priority.  As part of our continuing mission to provide you with exceptional heart care, our providers are all part of one team.  This team includes your primary Cardiologist (physician) and Advanced Practice Providers or APPs (Physician Assistants and Nurse Practitioners) who all work together to provide you with the care you need, when you need it.  Your next appointment  6 months   Call in Jan to schedule April appointment     Provider:  Dr.[name]   We recommend signing up for the patient portal called MyChart.  Sign up information is provided on this After Visit Summary.  MyChart is used to connect with patients for Virtual Visits (Telemedicine).  Patients are able to view lab/test results, encounter notes, upcoming appointments, etc.  Non-urgent messages can be sent to your provider as well.   To learn more about what you can do with MyChart, go to [email or web address].

## 2024-03-19 DIAGNOSIS — N189 Chronic kidney disease, unspecified: Secondary | ICD-10-CM | POA: Diagnosis not present

## 2024-03-26 ENCOUNTER — Encounter: Payer: Self-pay | Admitting: Internal Medicine

## 2024-03-26 NOTE — Telephone Encounter (Signed)
 Error

## 2024-03-27 DIAGNOSIS — I5033 Acute on chronic diastolic (congestive) heart failure: Secondary | ICD-10-CM | POA: Diagnosis not present

## 2024-03-27 DIAGNOSIS — R41841 Cognitive communication deficit: Secondary | ICD-10-CM | POA: Diagnosis not present

## 2024-03-27 DIAGNOSIS — M4807 Spinal stenosis, lumbosacral region: Secondary | ICD-10-CM | POA: Diagnosis not present

## 2024-04-02 ENCOUNTER — Other Ambulatory Visit: Payer: Medicare Other

## 2024-04-03 DIAGNOSIS — M4807 Spinal stenosis, lumbosacral region: Secondary | ICD-10-CM | POA: Diagnosis not present

## 2024-04-03 DIAGNOSIS — R41841 Cognitive communication deficit: Secondary | ICD-10-CM | POA: Diagnosis not present

## 2024-04-03 DIAGNOSIS — I5033 Acute on chronic diastolic (congestive) heart failure: Secondary | ICD-10-CM | POA: Diagnosis not present

## 2024-04-09 ENCOUNTER — Other Ambulatory Visit: Payer: Self-pay | Admitting: Adult Health

## 2024-04-09 MED ORDER — PHENOBARBITAL 64.8 MG PO TABS: 64.8000 mg | ORAL_TABLET | Freq: Every day | ORAL | 5 refills | Status: DC

## 2024-04-23 ENCOUNTER — Ambulatory Visit: Admitting: Cardiology

## 2024-04-23 ENCOUNTER — Encounter: Payer: Self-pay | Admitting: Adult Health

## 2024-04-23 ENCOUNTER — Non-Acute Institutional Stay: Payer: Self-pay | Admitting: Adult Health

## 2024-04-23 DIAGNOSIS — I10 Essential (primary) hypertension: Secondary | ICD-10-CM | POA: Diagnosis not present

## 2024-04-23 DIAGNOSIS — S01302A Unspecified open wound of left ear, initial encounter: Secondary | ICD-10-CM | POA: Diagnosis not present

## 2024-04-23 DIAGNOSIS — I5031 Acute diastolic (congestive) heart failure: Secondary | ICD-10-CM

## 2024-04-23 NOTE — Progress Notes (Signed)
 Location:  Medical Illustrator of Service:  ALF (13) Provider:   Bari America, ANP [facility] [phone]   [name], MD  Patient Care Team: [name], MD as PCP - General (Internal Medicine) [name], [name], MD as PCP - Cardiology (Cardiology) [name], MD as Consulting Physician (Neurology)  Extended Emergency Contact Information Primary Emergency Contact: [name],[name] Mobile Phone: [phone] Relation: Daughter Secondary Emergency Contact: LENNETTE, FADER Mobile Phone: [phone] Relation: Son  Code Status:  DNR Goals of care: Advanced Directive information    09/26/2023    2:48 PM  Advanced Directives  Would patient like information on creating a medical advance directive? No - Patient declined     Chief Complaint  Patient presents with   Acute Visit    Weight gain, nasal congestion, facial edema    HPI:  The patient is an 88 year old with hx of syncope, status post pacemaker, atrial fibrillation, and congestive heart failure who presents with facial edema, weight gain, and nasal congestion.  Facial edema and nasal congestion - Facial edema and nasal congestion present for two days - Associated with sensation of facial pressure - No sore throat or fever -no sob -hx of facial edema when sleeping on her left side.   Weight gain - Gained seven pounds over the past month  Chest pressure - Chest pressure occurred one day ago - Chest pressure has resolved - No shortness of breath -normal 02 sats  Peripheral edema and localized pain - Chronic left-sided edema, attributed to sleeping on that side - Painful area on left ear, likely due to sleeping position  Gastrointestinal symptoms - Loose stools occurred one day ago, now resolved - Decreased appetite - Consuming Gatorade and ginger ale  LABS COVID-19 PCR test: negative Influenza PCR test: negative  Past Medical History:  Diagnosis Date    Abnormal cardiovascular stress test    Carotid arterial disease    MODERATE RIGHT   Cerebral AV malformation    DDD (degenerative disc disease)    SEVERE WITH SPINAL STENOSIS   Hypercholesterolemia    Hypertension    MVP (mitral valve prolapse)    MILD   Numbness    LEFT FOOT AND ANKLE   Osteopenia    Presence of permanent cardiac pacemaker    Pseudo-gout    Past Surgical History:  Procedure Laterality Date   APPENDECTOMY     BREAST MASS EXCISION     CARPAL TUNNEL RELEASE     CHOLECYSTECTOMY N/A 04/23/2022   Procedure: LAPAROSCOPIC CHOLECYSTECTOMY;  Surgeon: Vernetta Berg, MD;  Location: [facility];  Service: General;  Laterality: N/A;   gallbladder     PACEMAKER IMPLANT N/A 11/07/2021   Procedure: PACEMAKER IMPLANT;  Surgeon: Waddell Danelle ORN, MD;  Location: [facility];  Service: Cardiovascular;  Laterality: N/A;   REMOVAL OF PARATHYROID  GLAND     STRESS NUCLEAR STUDY     ABNORMAL   TONSILLECTOMY      Allergies  Allergen Reactions   Aleve [Naproxen] Other (See Comments)   Azithromycin Other (See Comments)    Reaction not recalled   Penicillins Hives and Other (See Comments)    Bad reaction as a child after a shot of Penicillin   Pravastatin Other (See Comments)    Reaction not recalled   Sulfa Antibiotics Other (See Comments)   Sulfa Drugs Cross Reactors Other (See Comments)    Reaction not recalled- stated she can take it  in small doses   Tape Itching and Other (See Comments)    Cannot wear for an extended period of time   Naproxen Sodium Rash    Outpatient Encounter Medications as of 04/23/2024  Medication Sig   acetaminophen  (TYLENOL ) 500 MG tablet Take 1 tablet (500 mg total) by mouth every 6 (six) hours as needed for mild pain (pain score 1-3) or headache.   amLODipine  (NORVASC ) 5 MG tablet Take 1 tablet (5 mg total) by mouth daily.   b complex vitamins capsule Take 1 capsule by mouth daily.   fluticasone  (FLONASE ) 50 MCG/ACT nasal spray Place 1-2  sprays into both nostrils daily.   guaiFENesin -dextromethorphan (ROBITUSSIN DM) 100-10 MG/5ML syrup Take 10 mLs by mouth every 6 (six) hours as needed for cough.   ipratropium-albuterol  (DUONEB) 0.5-2.5 (3) MG/3ML SOLN Take 3 mLs by nebulization every 6 (six) hours as needed (SOB/Wheezing).   Iron , Ferrous Sulfate , 325 (65 Fe) MG TABS Take 325 mg by mouth 3 (three) times a week.   magnesium  oxide (MAG-OX) 400 (240 Mg) MG tablet Take 400 mg by mouth 2 (two) times daily.   Nutritional Supplements (BOOST ORIGINAL PO) Take by mouth as directed. Give 1 can orally one time a day   ondansetron  (ZOFRAN -ODT) 4 MG disintegrating tablet Take 2 mg by mouth every 4 (four) hours as needed for nausea or vomiting.   PHENobarbital  (LUMINAL) 64.8 MG tablet Take 1 tablet (64.8 mg total) by mouth at bedtime.   phenytoin  (DILANTIN ) 100 MG ER capsule Take 200 mg by mouth daily.   Polyethyl Glycol-Propyl Glycol (SYSTANE) 0.4-0.3 % SOLN Place 1 drop into both eyes in the morning and at bedtime.   torsemide  (DEMADEX ) 10 MG tablet Take 1 tablet (10 mg total) by mouth 3 (three) times a week.   triamcinolone  cream (KENALOG ) 0.1 % Apply 1 Application topically 2 (two) times daily as needed.   valsartan  (DIOVAN ) 160 MG tablet Take 1 tablet (160 mg total) by mouth 2 (two) times daily.   No facility-administered encounter medications on file as of 04/23/2024.    Review of Systems  Constitutional:  Positive for appetite change and fatigue. Negative for activity change, chills, diaphoresis and fever.  HENT:  Positive for congestion, ear pain, facial swelling, rhinorrhea and sinus pressure. Negative for hearing loss, sinus pain, sneezing, sore throat and trouble swallowing.   Respiratory:  Negative for cough, shortness of breath and wheezing.   Cardiovascular:  Positive for leg swelling. Negative for chest pain.  Gastrointestinal:  Negative for abdominal distention, abdominal pain, constipation, diarrhea, nausea and vomiting.   Genitourinary:  Negative for difficulty urinating, dysuria and urgency.  Musculoskeletal:  Positive for arthralgias and gait problem. Negative for back pain, myalgias and neck pain.  Skin:  Negative for rash.  Neurological:  Negative for dizziness and weakness.  Psychiatric/Behavioral:  Negative for confusion.     Immunization History  Administered Date(s) Administered   Fluad Quad(high Dose 65+) 04/16/2022   Fluad Trivalent(High Dose 65+) 04/02/2023   Fluzone Influenza virus vaccine,trivalent (IIV3), split virus 03/17/2010, 03/26/2011, 04/01/2012, 03/05/2013   Influenza Split 03/28/2009   Influenza, Quadrivalent, Recombinant, Inj, Pf 03/08/2018, 02/13/2019, 02/24/2020   Influenza,inj,Quad PF,6+ Mos 02/25/2014, 03/05/2015   Influenza-Unspecified 02/09/2014, 02/25/2016, 04/06/2017, 04/25/2021   Moderna Covid-19 Vaccine  Bivalent Booster 43yrs & up 04/02/2023   Moderna Sars-Covid-2 Vaccination 07/17/2019, 08/14/2019, 02/24/2020, 04/26/2020   Pneumococcal Conjugate-13 11/26/2014   Pneumococcal Polysaccharide-23 06/23/2003, 11/10/2012   Pneumococcal-Unspecified 06/23/2003, 11/10/2012   Td (Adult), 2 Lf Tetanus Toxid, Preservative  Free 06/11/2000, 11/06/2011   Td (Adult),5 Lf Tetanus Toxid, Preservative Free 06/11/2000, 11/06/2011   Unspecified SARS-COV-2 Vaccination 04/28/2021   Zoster Recombinant(Shingrix) 01/09/2019, 05/08/2019   Zoster, Live 01/15/2006, 01/15/2019   Pertinent  Health Maintenance Due  Topic Date Due   DEXA SCAN  Never done   Influenza Vaccine  01/10/2024      04/08/2023    1:34 PM 06/18/2023    1:50 PM 07/23/2023    9:49 AM 08/05/2023    3:34 PM 08/13/2023   10:30 AM  Fall Risk  Falls in the past year? 0 0 0 0 0  Was there an injury with Fall? 0 0 0 0 0  Fall Risk Category Calculator 0 0 0 0 0  Patient at Risk for Falls Due to  No Fall Risks  No Fall Risks Impaired balance/gait;Impaired mobility  Fall risk Follow up Falls evaluation completed Falls evaluation  completed Falls evaluation completed Falls evaluation completed Falls evaluation completed   Functional Status Survey:    Vitals:   04/23/24 1209  BP: (!) 160/60  Pulse: 63  Resp: 20  Temp: 98.5 F (36.9 C)  SpO2: 94%  Weight: 127 lb 6.4 oz (57.8 kg)   Body mass index is 21.2 kg/m. Physical Exam Vitals and nursing note reviewed.  Constitutional:      Appearance: Normal appearance.  HENT:     Head: Normocephalic and atraumatic.     Right Ear: Tympanic membrane normal.     Left Ear: Tympanic membrane normal.     Ears:     Comments: Left outer auricle with area of erythema and tenderness, healing wound with escar    Nose: Congestion present.     Mouth/Throat:     Mouth: Mucous membranes are moist.     Pharynx: Oropharynx is clear.  Eyes:     Conjunctiva/sclera: Conjunctivae normal.     Pupils: Pupils are equal, round, and reactive to light.  Cardiovascular:     Rate and Rhythm: Normal rate and regular rhythm.     Heart sounds: No murmur heard. Pulmonary:     Effort: Pulmonary effort is normal. No respiratory distress.     Breath sounds: Rales (left base) present. No wheezing.     Comments: Decreased left base Abdominal:     General: Bowel sounds are normal. There is no distension.     Palpations: Abdomen is soft.     Tenderness: There is no abdominal tenderness.  Musculoskeletal:     Cervical back: Normal range of motion. No rigidity.     Right lower leg: Edema (+1) present.     Left lower leg: Edema (+1) present.  Lymphadenopathy:     Cervical: No cervical adenopathy.  Skin:    General: Skin is warm and dry.  Neurological:     General: No focal deficit present.     Mental Status: She is alert and oriented to person, place, and time. Mental status is at baseline.  Psychiatric:        Mood and Affect: Mood normal.     Labs reviewed: Recent Labs    09/25/23 1245 09/26/23 0448 09/27/23 0508 09/30/23 0000 10/18/23 0000 11/14/23 0000 12/17/23 0000  NA  134* 136 138   < > 138 140 138  K 3.8 3.8 3.9   < > 4.4 4.5 4.5  CL 98 102 107   < > 102 104 104  CO2 22 21* 22   < > 25* 22 24*  GLUCOSE 114* 91 90  --   --   --   --  BUN 119* 105* 86*   < > 39* 62* 38*  CREATININE 2.14* 1.72* 1.44*   < > 1.3* 1.6* 1.2*  CALCIUM  8.9 8.8* 8.6*   < > 8.8 9.1 8.6*   < > = values in this interval not displayed.   Recent Labs    06/13/23 0000 10/18/23 0000 11/14/23 0000  AST 30  --  24  ALT 21  --  14  ALKPHOS 74  --  86  ALBUMIN 3.5 3.2* 3.6   Recent Labs    09/25/23 1245 09/25/23 2354 09/26/23 0448 09/30/23 0000 10/18/23 0000 11/14/23 0000  WBC 10.2 9.0 9.4 7.7 5.8 4.2  NEUTROABS  --   --  7.2  --  4.10  --   HGB 11.0* 10.8* 10.7* 9.4* 8.0* 9.5*  HCT 32.6* 32.1* 30.8* 28* 23* 28*  MCV 97.6 97.3 96.9  --   --   --   PLT 262 188 264 245 274 225   Lab Results  Component Value Date   TSH 4.05 11/14/2023   Lab Results  Component Value Date   HGBA1C 204 06/13/2023   Lab Results  Component Value Date   CHOL 208 (H) 01/28/2022   HDL 97 (A) 06/13/2023   LDLCALC 97 06/13/2023   TRIG 51 06/13/2023   CHOLHDL 2.6 01/28/2022    Significant Diagnostic Results in last 30 days:  No results found.  Assessment/Plan  Acute on chronic diastolic CHF CHF dilated atria, EF 60-65%, severe pulmonary htn with moderate pericardial effusion (treated with colchicine ) Acute weight gain of seven pounds likely due to congestive heart failure. Facial edema and nasal congestion present. Chronic left-sided edema exacerbated by sleeping position. Cardiologist advised liberal management due to syncope and fall risk. - Administered torsemide  20 mg daily for two days, then resume prior dosing. - Monitor vital signs and follow up if symptoms worsen or do not improve.  Hypertension with wide pulse pressure Blood pressure elevated at 160/60 with wide pulse pressure. Cardiologist advised liberal management due to syncope and fall risk. Torsemide  may aid in blood  pressure control. - Continue to monitor blood pressure and adjust management as needed.  Left ear wound Painful area on left ear likely due to sleeping position. - Apply Bactroban BID for seven days. - Use padding on left ear.  Acute viral upper respiratory infection with nasal congestion and facial pressure Nasal congestion and facial pressure for two days. COVID and flu swabs normal. Likely viral infection. - Monitor symptoms and provide supportive care.  Check CMP 04/27/24 Total time :  time greater than 50% of total time spent doing pt counseling and coordination of care

## 2024-04-27 DIAGNOSIS — I5032 Chronic diastolic (congestive) heart failure: Secondary | ICD-10-CM | POA: Diagnosis not present

## 2024-04-30 ENCOUNTER — Non-Acute Institutional Stay (SKILLED_NURSING_FACILITY): Payer: Self-pay | Admitting: Adult Health

## 2024-04-30 ENCOUNTER — Encounter: Payer: Self-pay | Admitting: Adult Health

## 2024-04-30 DIAGNOSIS — R5383 Other fatigue: Secondary | ICD-10-CM | POA: Diagnosis not present

## 2024-04-30 DIAGNOSIS — S01302D Unspecified open wound of left ear, subsequent encounter: Secondary | ICD-10-CM | POA: Diagnosis not present

## 2024-04-30 DIAGNOSIS — N1832 Chronic kidney disease, stage 3b: Secondary | ICD-10-CM

## 2024-04-30 DIAGNOSIS — I5033 Acute on chronic diastolic (congestive) heart failure: Secondary | ICD-10-CM | POA: Diagnosis not present

## 2024-04-30 MED ORDER — HYDRALAZINE HCL 10 MG PO TABS: 10.0000 mg | ORAL_TABLET | Freq: Two times a day (BID) | ORAL | Status: DC | PRN

## 2024-04-30 NOTE — Progress Notes (Signed)
 Location:  Medical Illustrator of Service:  SNF (31) Provider:   Bari America, ANP [facility] [phone]   [name], MD  Patient Care Team: [name], MD as PCP - General (Internal Medicine) [name], [name], MD as PCP - Cardiology (Cardiology) [name], MD as Consulting Physician (Neurology)  Extended Emergency Contact Information Primary Emergency Contact: [name],[name] Mobile Phone: [phone] Relation: Daughter Secondary Emergency Contact: MALLIKA, SANMIGUEL Mobile Phone: [phone] Relation: Son  Code Status:  DNR Goals of care: Advanced Directive information    09/26/2023    2:48 PM  Advanced Directives  Would patient like information on creating a medical advance directive? No - Patient declined     Chief Complaint  Patient presents with   Acute Visit    fatigue    HPI:  Pt is a 88 y.o. female seen today for an acute visit for fatigue  Staff reports she is sleeping more and feeling tired and not going down to the dining area. Lives in [place]. She has lost 6 lbs after diuresis for weight gain. (Had two doses of torsemide  20 mg) Had a viral upper resp illness last week which has improved. No fever or cough. Occasional runny nose.   Follow up BMP after torsemide  below  04/27/24; BUN 46 Cr 1.34 K 3.8 NA 138  Her bp has been running high up to 174/80. Has chronic high systolic with low diastolic. Liberal management due to fall risk Hx of syncope with pacemaker followed by cardiology She denies any sob or chest pain Edema is improved to legs and face Has a wound to her left ear due to pressure from sleeping. There was a mild infection and she was placed on bactroban. Redness and tenderness are improving.   CBC H/H 10/29.4 chronic anemia on iron  Past Medical History:  Diagnosis Date   Abnormal cardiovascular stress test    Carotid arterial disease    MODERATE RIGHT   Cerebral AV malformation    DDD  (degenerative disc disease)    SEVERE WITH SPINAL STENOSIS   Hypercholesterolemia    Hypertension    MVP (mitral valve prolapse)    MILD   Numbness    LEFT FOOT AND ANKLE   Osteopenia    Presence of permanent cardiac pacemaker    Pseudo-gout    Past Surgical History:  Procedure Laterality Date   APPENDECTOMY     BREAST MASS EXCISION     CARPAL TUNNEL RELEASE     CHOLECYSTECTOMY N/A 04/23/2022   Procedure: LAPAROSCOPIC CHOLECYSTECTOMY;  Surgeon: Vernetta Berg, MD;  Location: [facility];  Service: General;  Laterality: N/A;   gallbladder     PACEMAKER IMPLANT N/A 11/07/2021   Procedure: PACEMAKER IMPLANT;  Surgeon: Waddell Danelle ORN, MD;  Location: [facility];  Service: Cardiovascular;  Laterality: N/A;   REMOVAL OF PARATHYROID  GLAND     STRESS NUCLEAR STUDY     ABNORMAL   TONSILLECTOMY      Allergies  Allergen Reactions   Aleve [Naproxen] Other (See Comments)   Azithromycin Other (See Comments)    Reaction not recalled   Penicillins Hives and Other (See Comments)    Bad reaction as a child after a shot of Penicillin   Pravastatin Other (See Comments)    Reaction not recalled   Sulfa Antibiotics Other (See Comments)   Sulfa Drugs Cross Reactors Other (See Comments)    Reaction not recalled- stated she can take it  in small doses   Tape Itching and Other (See Comments)    Cannot wear for an extended period of time   Naproxen Sodium Rash    Outpatient Encounter Medications as of 04/30/2024  Medication Sig   hydrALAZINE  (APRESOLINE ) 10 MG tablet Take 1 tablet (10 mg total) by mouth 2 (two) times daily as needed. Sbp >170   acetaminophen  (TYLENOL ) 500 MG tablet Take 1 tablet (500 mg total) by mouth every 6 (six) hours as needed for mild pain (pain score 1-3) or headache.   amLODipine  (NORVASC ) 5 MG tablet Take 1 tablet (5 mg total) by mouth daily.   b complex vitamins capsule Take 1 capsule by mouth daily.   fluticasone  (FLONASE ) 50 MCG/ACT nasal spray Place 1-2  sprays into both nostrils daily.   guaiFENesin -dextromethorphan (ROBITUSSIN DM) 100-10 MG/5ML syrup Take 10 mLs by mouth every 6 (six) hours as needed for cough.   ipratropium-albuterol  (DUONEB) 0.5-2.5 (3) MG/3ML SOLN Take 3 mLs by nebulization every 6 (six) hours as needed (SOB/Wheezing).   Iron , Ferrous Sulfate , 325 (65 Fe) MG TABS Take 325 mg by mouth 3 (three) times a week.   magnesium  oxide (MAG-OX) 400 (240 Mg) MG tablet Take 400 mg by mouth 2 (two) times daily.   Nutritional Supplements (BOOST ORIGINAL PO) Take by mouth as directed. Give 1 can orally one time a day   ondansetron  (ZOFRAN -ODT) 4 MG disintegrating tablet Take 2 mg by mouth every 4 (four) hours as needed for nausea or vomiting.   PHENobarbital  (LUMINAL) 64.8 MG tablet Take 1 tablet (64.8 mg total) by mouth at bedtime.   phenytoin  (DILANTIN ) 100 MG ER capsule Take 200 mg by mouth daily.   Polyethyl Glycol-Propyl Glycol (SYSTANE) 0.4-0.3 % SOLN Place 1 drop into both eyes in the morning and at bedtime.   torsemide  (DEMADEX ) 10 MG tablet Take 1 tablet (10 mg total) by mouth 3 (three) times a week.   triamcinolone  cream (KENALOG ) 0.1 % Apply 1 Application topically 2 (two) times daily as needed.   valsartan  (DIOVAN ) 160 MG tablet Take 1 tablet (160 mg total) by mouth 2 (two) times daily.   No facility-administered encounter medications on file as of 04/30/2024.    Review of Systems  Constitutional:  Positive for activity change and fatigue. Negative for appetite change, chills, diaphoresis and fever.  HENT:  Positive for congestion (intermittent).   Respiratory:  Negative for cough, shortness of breath and wheezing.   Cardiovascular:  Positive for leg swelling. Negative for chest pain.  Gastrointestinal:  Negative for abdominal distention, abdominal pain, constipation, diarrhea, nausea and vomiting.  Genitourinary:  Negative for difficulty urinating, dysuria and urgency.  Musculoskeletal:  Positive for arthralgias and gait  problem. Negative for back pain, myalgias and neck pain.  Skin:  Negative for rash.  Neurological:  Negative for dizziness and weakness.  Psychiatric/Behavioral:  Negative for confusion.     Immunization History  Administered Date(s) Administered   Fluad Quad(high Dose 65+) 04/16/2022   Fluad Trivalent(High Dose 65+) 04/02/2023   Fluzone Influenza virus vaccine,trivalent (IIV3), split virus 03/17/2010, 03/26/2011, 04/01/2012, 03/05/2013   Influenza Split 03/28/2009   Influenza, Quadrivalent, Recombinant, Inj, Pf 03/08/2018, 02/13/2019, 02/24/2020   Influenza,inj,Quad PF,6+ Mos 02/25/2014, 03/05/2015   Influenza-Unspecified 02/09/2014, 02/25/2016, 04/06/2017, 04/25/2021   Moderna Covid-19 Vaccine  Bivalent Booster 77yrs & up 04/02/2023   Moderna Sars-Covid-2 Vaccination 07/17/2019, 08/14/2019, 02/24/2020, 04/26/2020   Pneumococcal Conjugate-13 11/26/2014   Pneumococcal Polysaccharide-23 06/23/2003, 11/10/2012   Pneumococcal-Unspecified 06/23/2003, 11/10/2012   Td (Adult), 2  Lf Tetanus Toxid, Preservative Free 06/11/2000, 11/06/2011   Td (Adult),5 Lf Tetanus Toxid, Preservative Free 06/11/2000, 11/06/2011   Unspecified SARS-COV-2 Vaccination 04/28/2021   Zoster Recombinant(Shingrix) 01/09/2019, 05/08/2019   Zoster, Live 01/15/2006, 01/15/2019   Pertinent  Health Maintenance Due  Topic Date Due   Bone Density Scan  Never done   Influenza Vaccine  01/10/2024      04/08/2023    1:34 PM 06/18/2023    1:50 PM 07/23/2023    9:49 AM 08/05/2023    3:34 PM 08/13/2023   10:30 AM  Fall Risk  Falls in the past year? 0 0 0 0 0  Was there an injury with Fall? 0 0 0 0 0  Fall Risk Category Calculator 0 0 0 0 0  Patient at Risk for Falls Due to  No Fall Risks  No Fall Risks Impaired balance/gait;Impaired mobility  Fall risk Follow up Falls evaluation completed Falls evaluation completed Falls evaluation completed Falls evaluation completed Falls evaluation completed   Functional Status Survey:     Vitals:   04/30/24 1430 04/30/24 1431  BP: (!) 174/80 (!) 154/51  Pulse: 64   Weight: 122 lb 9.6 oz (55.6 kg)    Body mass index is 20.4 kg/m. Physical Exam Vitals and nursing note reviewed.  Constitutional:      General: She is not in acute distress.    Appearance: She is not diaphoretic.  HENT:     Head: Normocephalic and atraumatic.     Ears:     Comments: Left auricle with healing pinpoint wound with mild erythema. No tenderness. No drainage, much improved.     Nose: Nose normal.     Mouth/Throat:     Mouth: Mucous membranes are moist.     Pharynx: Oropharynx is clear.  Eyes:     Conjunctiva/sclera: Conjunctivae normal.     Pupils: Pupils are equal, round, and reactive to light.  Neck:     Vascular: No JVD.  Cardiovascular:     Rate and Rhythm: Normal rate and regular rhythm.     Heart sounds: Murmur heard.  Pulmonary:     Effort: Pulmonary effort is normal. No respiratory distress.     Breath sounds: No wheezing.     Comments: Slight decreased left base Abdominal:     General: Bowel sounds are normal. There is no distension.     Palpations: Abdomen is soft.     Tenderness: There is no abdominal tenderness.  Musculoskeletal:     Comments: Trace edema to both ankles  Skin:    General: Skin is warm and dry.  Neurological:     Mental Status: She is alert and oriented to person, place, and time.     Labs reviewed: Recent Labs    09/25/23 1245 09/26/23 0448 09/27/23 0508 09/30/23 0000 10/18/23 0000 11/14/23 0000 12/17/23 0000  NA 134* 136 138   < > 138 140 138  K 3.8 3.8 3.9   < > 4.4 4.5 4.5  CL 98 102 107   < > 102 104 104  CO2 22 21* 22   < > 25* 22 24*  GLUCOSE 114* 91 90  --   --   --   --   BUN 119* 105* 86*   < > 39* 62* 38*  CREATININE 2.14* 1.72* 1.44*   < > 1.3* 1.6* 1.2*  CALCIUM  8.9 8.8* 8.6*   < > 8.8 9.1 8.6*   < > = values in this interval not displayed.  Recent Labs    06/13/23 0000 10/18/23 0000 11/14/23 0000  AST 30  --  24   ALT 21  --  14  ALKPHOS 74  --  86  ALBUMIN 3.5 3.2* 3.6   Recent Labs    09/25/23 1245 09/25/23 2354 09/26/23 0448 09/30/23 0000 10/18/23 0000 11/14/23 0000  WBC 10.2 9.0 9.4 7.7 5.8 4.2  NEUTROABS  --   --  7.2  --  4.10  --   HGB 11.0* 10.8* 10.7* 9.4* 8.0* 9.5*  HCT 32.6* 32.1* 30.8* 28* 23* 28*  MCV 97.6 97.3 96.9  --   --   --   PLT 262 188 264 245 274 225   Lab Results  Component Value Date   TSH 4.05 11/14/2023   Lab Results  Component Value Date   HGBA1C 204 06/13/2023   Lab Results  Component Value Date   CHOL 208 (H) 01/28/2022   HDL 97 (A) 06/13/2023   LDLCALC 97 06/13/2023   TRIG 51 06/13/2023   CHOLHDL 2.6 01/28/2022    Significant Diagnostic Results in last 30 days:  No results found.  Assessment/Plan  1. Other fatigue (Primary) This is a chronic issue but recently she is recovering from a viral illness and had  weight loss after diuresis. BUN was slightly  up from baseline. Encouraged her to rest and drink some gatorade. Lung sounds are good. Vitals are good.  Will follow  2. Acute on chronic diastolic (congestive) heart failure (HCC) Improved Torsemide  10 mg three times weekly continue  Also on diovan  and norvasc   3. Open wound of left ear, unspecified open wound type, subsequent encounter Improved  Continues dressing changes  4. Stage 3b chronic kidney disease (HCC) Unchanged Continue to periodically monitor BMP and avoid nephrotoxic agents  5. HTN Add prn hydralazine  only if SBP>170    Total time :  time greater than 50% of total time spent doing pt counseling and coordination of care

## 2024-05-06 ENCOUNTER — Ambulatory Visit: Payer: Medicare Other

## 2024-05-13 ENCOUNTER — Ambulatory Visit

## 2024-05-13 DIAGNOSIS — I4821 Permanent atrial fibrillation: Secondary | ICD-10-CM

## 2024-05-13 LAB — CUP PACEART REMOTE DEVICE CHECK
Battery Remaining Longevity: 125 mo
Battery Voltage: 3.02 V
Brady Statistic AP VP Percent: 0 %
Brady Statistic AP VS Percent: 0 %
Brady Statistic AS VP Percent: 95.43 %
Brady Statistic AS VS Percent: 4.57 %
Brady Statistic RA Percent Paced: 0 %
Brady Statistic RV Percent Paced: 95.43 %
Date Time Interrogation Session: 20251203131440
Implantable Lead Connection Status: 753985
Implantable Lead Connection Status: 753985
Implantable Lead Implant Date: 20230530
Implantable Lead Implant Date: 20230530
Implantable Lead Location: 753859
Implantable Lead Location: 753860
Implantable Lead Model: 3830
Implantable Lead Model: 5076
Implantable Pulse Generator Implant Date: 20230530
Lead Channel Impedance Value: 228 Ohm
Lead Channel Impedance Value: 323 Ohm
Lead Channel Impedance Value: 418 Ohm
Lead Channel Impedance Value: 437 Ohm
Lead Channel Pacing Threshold Amplitude: 0.875 V
Lead Channel Pacing Threshold Amplitude: 1.375 V
Lead Channel Pacing Threshold Pulse Width: 0.4 ms
Lead Channel Pacing Threshold Pulse Width: 0.4 ms
Lead Channel Sensing Intrinsic Amplitude: 0.125 mV
Lead Channel Sensing Intrinsic Amplitude: 0.25 mV
Lead Channel Sensing Intrinsic Amplitude: 9.25 mV
Lead Channel Sensing Intrinsic Amplitude: 9.25 mV
Lead Channel Setting Pacing Amplitude: 2 V
Lead Channel Setting Pacing Pulse Width: 0.4 ms
Lead Channel Setting Sensing Sensitivity: 1.2 mV
Zone Setting Status: 755011

## 2024-05-14 NOTE — Progress Notes (Signed)
 Remote PPM Transmission

## 2024-05-17 ENCOUNTER — Ambulatory Visit: Payer: Self-pay | Admitting: Internal Medicine

## 2024-05-17 ENCOUNTER — Other Ambulatory Visit: Payer: Self-pay

## 2024-05-17 ENCOUNTER — Emergency Department (HOSPITAL_COMMUNITY)

## 2024-05-17 ENCOUNTER — Encounter (HOSPITAL_COMMUNITY): Payer: Self-pay | Admitting: Emergency Medicine

## 2024-05-17 ENCOUNTER — Inpatient Hospital Stay (HOSPITAL_COMMUNITY)
Admission: EM | Admit: 2024-05-17 | Discharge: 2024-05-20 | DRG: 480 | Disposition: A | Discharge status: Skilled Nursing Facility | Source: Skilled Nursing Facility | Attending: Internal Medicine | Admitting: Internal Medicine

## 2024-05-17 DIAGNOSIS — Z880 Allergy status to penicillin: Secondary | ICD-10-CM | POA: Diagnosis not present

## 2024-05-17 DIAGNOSIS — Z9049 Acquired absence of other specified parts of digestive tract: Secondary | ICD-10-CM | POA: Diagnosis not present

## 2024-05-17 DIAGNOSIS — R220 Localized swelling, mass and lump, head: Secondary | ICD-10-CM | POA: Diagnosis not present

## 2024-05-17 DIAGNOSIS — I672 Cerebral atherosclerosis: Secondary | ICD-10-CM | POA: Diagnosis not present

## 2024-05-17 DIAGNOSIS — Q282 Arteriovenous malformation of cerebral vessels: Secondary | ICD-10-CM

## 2024-05-17 DIAGNOSIS — Z66 Do not resuscitate: Secondary | ICD-10-CM | POA: Diagnosis present

## 2024-05-17 DIAGNOSIS — I3139 Other pericardial effusion (noninflammatory): Secondary | ICD-10-CM | POA: Diagnosis present

## 2024-05-17 DIAGNOSIS — D62 Acute posthemorrhagic anemia: Secondary | ICD-10-CM | POA: Insufficient documentation

## 2024-05-17 DIAGNOSIS — I270 Primary pulmonary hypertension: Secondary | ICD-10-CM | POA: Diagnosis not present

## 2024-05-17 DIAGNOSIS — Z87891 Personal history of nicotine dependence: Secondary | ICD-10-CM | POA: Diagnosis not present

## 2024-05-17 DIAGNOSIS — M8580 Other specified disorders of bone density and structure, unspecified site: Secondary | ICD-10-CM | POA: Diagnosis not present

## 2024-05-17 DIAGNOSIS — I480 Paroxysmal atrial fibrillation: Secondary | ICD-10-CM | POA: Diagnosis present

## 2024-05-17 DIAGNOSIS — M25552 Pain in left hip: Secondary | ICD-10-CM | POA: Diagnosis not present

## 2024-05-17 DIAGNOSIS — G936 Cerebral edema: Secondary | ICD-10-CM | POA: Diagnosis present

## 2024-05-17 DIAGNOSIS — N1832 Chronic kidney disease, stage 3b: Secondary | ICD-10-CM | POA: Diagnosis present

## 2024-05-17 DIAGNOSIS — S72142A Displaced intertrochanteric fracture of left femur, initial encounter for closed fracture: Secondary | ICD-10-CM | POA: Diagnosis present

## 2024-05-17 DIAGNOSIS — S199XXA Unspecified injury of neck, initial encounter: Secondary | ICD-10-CM | POA: Diagnosis not present

## 2024-05-17 DIAGNOSIS — I10 Essential (primary) hypertension: Secondary | ICD-10-CM | POA: Diagnosis present

## 2024-05-17 DIAGNOSIS — G40209 Localization-related (focal) (partial) symptomatic epilepsy and epileptic syndromes with complex partial seizures, not intractable, without status epilepticus: Secondary | ICD-10-CM | POA: Diagnosis present

## 2024-05-17 DIAGNOSIS — S0990XA Unspecified injury of head, initial encounter: Secondary | ICD-10-CM | POA: Diagnosis not present

## 2024-05-17 DIAGNOSIS — J90 Pleural effusion, not elsewhere classified: Secondary | ICD-10-CM | POA: Diagnosis not present

## 2024-05-17 DIAGNOSIS — S72001A Fracture of unspecified part of neck of right femur, initial encounter for closed fracture: Secondary | ICD-10-CM | POA: Diagnosis not present

## 2024-05-17 DIAGNOSIS — M47812 Spondylosis without myelopathy or radiculopathy, cervical region: Secondary | ICD-10-CM | POA: Diagnosis not present

## 2024-05-17 DIAGNOSIS — R918 Other nonspecific abnormal finding of lung field: Secondary | ICD-10-CM | POA: Diagnosis not present

## 2024-05-17 DIAGNOSIS — N183 Chronic kidney disease, stage 3 unspecified: Secondary | ICD-10-CM | POA: Diagnosis not present

## 2024-05-17 DIAGNOSIS — M4802 Spinal stenosis, cervical region: Secondary | ICD-10-CM | POA: Diagnosis not present

## 2024-05-17 DIAGNOSIS — M51369 Other intervertebral disc degeneration, lumbar region without mention of lumbar back pain or lower extremity pain: Secondary | ICD-10-CM | POA: Diagnosis not present

## 2024-05-17 DIAGNOSIS — I2510 Atherosclerotic heart disease of native coronary artery without angina pectoris: Secondary | ICD-10-CM | POA: Diagnosis present

## 2024-05-17 DIAGNOSIS — I509 Heart failure, unspecified: Secondary | ICD-10-CM | POA: Diagnosis not present

## 2024-05-17 DIAGNOSIS — Z801 Family history of malignant neoplasm of trachea, bronchus and lung: Secondary | ICD-10-CM | POA: Diagnosis not present

## 2024-05-17 DIAGNOSIS — L8990 Pressure ulcer of unspecified site, unspecified stage: Secondary | ICD-10-CM | POA: Diagnosis present

## 2024-05-17 DIAGNOSIS — I272 Pulmonary hypertension, unspecified: Secondary | ICD-10-CM | POA: Diagnosis present

## 2024-05-17 DIAGNOSIS — G40909 Epilepsy, unspecified, not intractable, without status epilepticus: Secondary | ICD-10-CM

## 2024-05-17 DIAGNOSIS — S7292XA Unspecified fracture of left femur, initial encounter for closed fracture: Secondary | ICD-10-CM

## 2024-05-17 DIAGNOSIS — Z823 Family history of stroke: Secondary | ICD-10-CM | POA: Diagnosis not present

## 2024-05-17 DIAGNOSIS — Z950 Presence of cardiac pacemaker: Secondary | ICD-10-CM | POA: Diagnosis not present

## 2024-05-17 DIAGNOSIS — Z881 Allergy status to other antibiotic agents status: Secondary | ICD-10-CM | POA: Diagnosis not present

## 2024-05-17 DIAGNOSIS — I5032 Chronic diastolic (congestive) heart failure: Secondary | ICD-10-CM | POA: Diagnosis present

## 2024-05-17 DIAGNOSIS — I341 Nonrheumatic mitral (valve) prolapse: Secondary | ICD-10-CM | POA: Diagnosis present

## 2024-05-17 DIAGNOSIS — R338 Other retention of urine: Secondary | ICD-10-CM | POA: Insufficient documentation

## 2024-05-17 DIAGNOSIS — I6529 Occlusion and stenosis of unspecified carotid artery: Secondary | ICD-10-CM | POA: Diagnosis not present

## 2024-05-17 DIAGNOSIS — I130 Hypertensive heart and chronic kidney disease with heart failure and stage 1 through stage 4 chronic kidney disease, or unspecified chronic kidney disease: Secondary | ICD-10-CM | POA: Diagnosis present

## 2024-05-17 DIAGNOSIS — M778 Other enthesopathies, not elsewhere classified: Secondary | ICD-10-CM | POA: Diagnosis not present

## 2024-05-17 DIAGNOSIS — Z043 Encounter for examination and observation following other accident: Secondary | ICD-10-CM | POA: Diagnosis not present

## 2024-05-17 DIAGNOSIS — E7800 Pure hypercholesterolemia, unspecified: Secondary | ICD-10-CM | POA: Diagnosis present

## 2024-05-17 DIAGNOSIS — I517 Cardiomegaly: Secondary | ICD-10-CM | POA: Diagnosis not present

## 2024-05-17 DIAGNOSIS — S72009A Fracture of unspecified part of neck of unspecified femur, initial encounter for closed fracture: Secondary | ICD-10-CM | POA: Diagnosis present

## 2024-05-17 DIAGNOSIS — W19XXXA Unspecified fall, initial encounter: Secondary | ICD-10-CM | POA: Diagnosis not present

## 2024-05-17 DIAGNOSIS — L89151 Pressure ulcer of sacral region, stage 1: Secondary | ICD-10-CM | POA: Diagnosis present

## 2024-05-17 DIAGNOSIS — Z7982 Long term (current) use of aspirin: Secondary | ICD-10-CM | POA: Diagnosis not present

## 2024-05-17 LAB — CBC WITH DIFFERENTIAL/PLATELET
Abs Immature Granulocytes: 0.04 K/uL (ref 0.00–0.07)
Basophils Absolute: 0 K/uL (ref 0.0–0.1)
Basophils Relative: 0 %
Eosinophils Absolute: 0 K/uL (ref 0.0–0.5)
Eosinophils Relative: 0 %
HCT: 27.1 % — ABNORMAL LOW (ref 36.0–46.0)
Hemoglobin: 8.9 g/dL — ABNORMAL LOW (ref 12.0–15.0)
Immature Granulocytes: 0 %
Lymphocytes Relative: 5 %
Lymphs Abs: 0.5 K/uL — ABNORMAL LOW (ref 0.7–4.0)
MCH: 33.8 pg (ref 26.0–34.0)
MCHC: 32.8 g/dL (ref 30.0–36.0)
MCV: 103 fL — ABNORMAL HIGH (ref 80.0–100.0)
Monocytes Absolute: 1.1 K/uL — ABNORMAL HIGH (ref 0.1–1.0)
Monocytes Relative: 12 %
Neutro Abs: 8 K/uL — ABNORMAL HIGH (ref 1.7–7.7)
Neutrophils Relative %: 83 %
Platelets: 187 K/uL (ref 150–400)
RBC: 2.63 MIL/uL — ABNORMAL LOW (ref 3.87–5.11)
RDW: 13.5 % (ref 11.5–15.5)
WBC: 9.8 K/uL (ref 4.0–10.5)
nRBC: 0 % (ref 0.0–0.2)

## 2024-05-17 LAB — ABO/RH: ABO/RH(D): A NEG

## 2024-05-17 LAB — BASIC METABOLIC PANEL WITH GFR
Anion gap: 11 (ref 5–15)
BUN: 46 mg/dL — ABNORMAL HIGH (ref 8–23)
CO2: 23 mmol/L (ref 22–32)
Calcium: 9.7 mg/dL (ref 8.9–10.3)
Chloride: 106 mmol/L (ref 98–111)
Creatinine, Ser: 1.3 mg/dL — ABNORMAL HIGH (ref 0.44–1.00)
GFR, Estimated: 40 mL/min — ABNORMAL LOW (ref >60)
Glucose, Bld: 119 mg/dL — ABNORMAL HIGH (ref 70–99)
Potassium: 4.6 mmol/L (ref 3.5–5.1)
Sodium: 140 mmol/L (ref 135–145)

## 2024-05-17 LAB — PROTIME-INR
INR: 1.1 (ref 0.8–1.2)
Prothrombin Time: 14.5 s (ref 11.4–15.2)

## 2024-05-17 MED ORDER — ONDANSETRON HCL 4 MG PO TABS: 4.0000 mg | ORAL_TABLET | Freq: Four times a day (QID) | ORAL | Status: DC | PRN

## 2024-05-17 MED ORDER — HYDROMORPHONE HCL 1 MG/ML IJ SOLN
0.2500 mg | INTRAMUSCULAR | Status: DC | PRN
  Administered 2024-05-18 – 2024-05-19 (×2): 0.25 mg via INTRAVENOUS
  Filled 2024-05-17 (×2): qty 0.5

## 2024-05-17 MED ORDER — ONDANSETRON HCL 4 MG/2ML IJ SOLN: 4.0000 mg | Freq: Four times a day (QID) | INTRAMUSCULAR | Status: DC | PRN

## 2024-05-17 MED ORDER — LEVETIRACETAM (KEPPRA) 500 MG/5 ML ADULT IV PUSH
500.0000 mg | Freq: Two times a day (BID) | INTRAVENOUS | Status: DC
  Filled 2024-05-17: qty 5

## 2024-05-17 MED ORDER — ACETAMINOPHEN 325 MG PO TABS
650.0000 mg | ORAL_TABLET | Freq: Four times a day (QID) | ORAL | Status: DC | PRN
  Administered 2024-05-18 – 2024-05-19 (×4): 650 mg via ORAL
  Filled 2024-05-17 (×4): qty 2

## 2024-05-17 MED ORDER — ACETAMINOPHEN 650 MG RE SUPP: 650.0000 mg | Freq: Four times a day (QID) | RECTAL | Status: DC | PRN

## 2024-05-17 MED ORDER — FENTANYL CITRATE (PF) 50 MCG/ML IJ SOSY
50.0000 ug | PREFILLED_SYRINGE | INTRAMUSCULAR | Status: AC | PRN
  Administered 2024-05-17 – 2024-05-18 (×2): 50 ug via INTRAVENOUS
  Filled 2024-05-17 (×3): qty 1

## 2024-05-17 MED ORDER — LEVETIRACETAM (KEPPRA) 500 MG/5 ML ADULT IV PUSH
500.0000 mg | Freq: Two times a day (BID) | INTRAVENOUS | Status: DC
  Administered 2024-05-18: 500 mg via INTRAVENOUS
  Filled 2024-05-17 (×5): qty 5

## 2024-05-17 MED ORDER — LEVETIRACETAM 500 MG PO TABS: 500.0000 mg | ORAL_TABLET | Freq: Two times a day (BID) | ORAL | Status: DC

## 2024-05-17 MED ORDER — AMLODIPINE BESYLATE 5 MG PO TABS
5.0000 mg | ORAL_TABLET | Freq: Every day | ORAL | Status: DC
  Administered 2024-05-17 – 2024-05-20 (×4): 5 mg via ORAL
  Filled 2024-05-17 (×4): qty 1

## 2024-05-17 MED ORDER — BISACODYL 5 MG PO TBEC: 5.0000 mg | DELAYED_RELEASE_TABLET | Freq: Every day | ORAL | Status: DC | PRN

## 2024-05-17 MED ORDER — LACTATED RINGERS IV BOLUS
500.0000 mL | Freq: Once | INTRAVENOUS | Status: AC
  Administered 2024-05-18: 500 mL via INTRAVENOUS

## 2024-05-17 MED ORDER — PHENOBARBITAL 32.4 MG PO TABS
64.8000 mg | ORAL_TABLET | Freq: Every day | ORAL | Status: DC
  Administered 2024-05-18 – 2024-05-19 (×3): 64.8 mg via ORAL
  Filled 2024-05-17 (×3): qty 2

## 2024-05-17 MED ORDER — DOCUSATE SODIUM 100 MG PO CAPS
100.0000 mg | ORAL_CAPSULE | Freq: Two times a day (BID) | ORAL | Status: DC
  Administered 2024-05-18 – 2024-05-20 (×4): 100 mg via ORAL
  Filled 2024-05-17 (×4): qty 1

## 2024-05-17 MED ORDER — LEVETIRACETAM (KEPPRA) 500 MG/5 ML ADULT IV PUSH
1500.0000 mg | Freq: Once | INTRAVENOUS | Status: AC
  Administered 2024-05-17: 1500 mg via INTRAVENOUS
  Filled 2024-05-17: qty 15

## 2024-05-17 MED ORDER — SODIUM CHLORIDE 0.9% FLUSH
3.0000 mL | Freq: Two times a day (BID) | INTRAVENOUS | Status: DC
  Administered 2024-05-17 – 2024-05-20 (×5): 3 mL via INTRAVENOUS

## 2024-05-17 MED ORDER — PHENYTOIN SODIUM EXTENDED 100 MG PO CAPS
200.0000 mg | ORAL_CAPSULE | Freq: Every day | ORAL | Status: DC
  Administered 2024-05-17 – 2024-05-20 (×4): 200 mg via ORAL
  Filled 2024-05-17 (×5): qty 2

## 2024-05-17 MED ORDER — MUPIROCIN 2 % EX OINT
1.0000 | TOPICAL_OINTMENT | Freq: Two times a day (BID) | CUTANEOUS | Status: DC
  Administered 2024-05-18 – 2024-05-20 (×6): 1 via NASAL
  Filled 2024-05-17: qty 22

## 2024-05-17 MED ORDER — LEVETIRACETAM 500 MG PO TABS
500.0000 mg | ORAL_TABLET | Freq: Two times a day (BID) | ORAL | Status: DC
  Administered 2024-05-18 – 2024-05-20 (×4): 500 mg via ORAL
  Filled 2024-05-17 (×4): qty 1

## 2024-05-17 NOTE — Plan of Care (Signed)
 Brief Neuro Note:  Patient has a hx of AVM with chronic vasogenic edema and on dilantin  for seizure control. Last time when she had gall bladder surgery, she had seizures post op and required transfer to [facility] for EEG. She is here for hipfracture and tTeam is worried about seizures post op again.  I think we should continue Dilantin  but in addition to that, it is resonable to load her up with Keppra  and do Keppra  for a week and then taper off.  - Continue Dilantin  ER 200mg  daily PO. Can be switched to Fosphenytoin  100mg  PE equivalent BID - will load with Keppra  20mg /Kg Iv once and start on 500mg  BID maintenance with plan to stop about a week after her hip fracture surgery.  Chatham Howington [facility]

## 2024-05-17 NOTE — H&P (Signed)
 History and Physical    Patient: [name] FMW:[id number] DOB: March 27, 1936 DOA: 05/17/2024 DOS: the patient was seen and examined on 05/17/2024 PCP: [name], MD  Patient coming from: ALF/ILF  Chief Complaint:  Chief Complaint  Patient presents with   Fall   HPI: [name] is a 88 y.o. female with medical history significant for CKD, atrial fibrillation not on anticoagulation because of cerebral AVN, history of pacemaker placement, congestive heart failure, cerebral AVM diagnosed in childhood, CAD, memory impairment, and iron  deficiency anemia who  presents after a fall.  The patient reports she was cleaning in her room and picking things up off the floor and she tripped over the leg of the table and hit her right elbow.  She said it was sore but she continued cleaning and ended up falling again this time she fell on the floor on her left side.  The staff helped her up into bed.  She did not discover the severe pain until she tried to get up this morning.  The patient was alert throughout yesterday evening and her cleaning and when she fell and trying to get up.  She never had a loss of consciousness.  She denies any dizziness or any chest pain.  In the emergency department the patient had a CT of her head and C-spine.  She also had x-rays of her right elbow and pelvis and chest. Her blood work reveals a creatinine of 1.3 which seems to be her baseline.  Her white count is normal at 9.8 her MCV is elevated at 103 and she is anemic with a hemoglobin of 8.9. She was found to have a left intertrochanteric femur fracture.  There is no fracture of her elbow.  Her chest x-ray reveals a stable left retrocardiac opacity and left pleural effusion present since at least 2023.  The CT of her head reveals a partially calcified mass compatible with her known AVM.  There is surrounding vasogenic edema causing some mass effect on the anterior horn of the left ventricle.  This was compared to her  last head CT dated November 2023. The ED provider called the neurovascular interventionalists on-call, Dr Ray who felt that no acute intervention is needed.  Dr. Gen[facility] was consulted.  He evaluated the patient and plans to do surgery tomorrow. The hospitalist have been called to admit the patient.  The patient's daughter tells me that her last surgery was in November 2023 when she had her gallbladder removed.  She did fine perioperatively but later that night while in her room she started having trouble with word finding then ended up having seizures.  She was then transferred from [facility] to the ICU at [facility]. The patient was placed on Keppra  for a few days. She did fine and was discharged on her usual regimen of Dilantin  in the morning with phenobarb at night.  Review of Systems: As mentioned in the history of present illness. All other systems reviewed and are negative. Past Medical History:  Diagnosis Date   Abnormal cardiovascular stress test    Carotid arterial disease    MODERATE RIGHT   Cerebral AV malformation    DDD (degenerative disc disease)    SEVERE WITH SPINAL STENOSIS   Hypercholesterolemia    Hypertension    MVP (mitral valve prolapse)    MILD   Numbness    LEFT FOOT AND ANKLE   Osteopenia    Presence of permanent cardiac  pacemaker    Pseudo-gout    Past Surgical History:  Procedure Laterality Date   APPENDECTOMY     BREAST MASS EXCISION     CARPAL TUNNEL RELEASE     CHOLECYSTECTOMY N/A 04/23/2022   Procedure: LAPAROSCOPIC CHOLECYSTECTOMY;  Surgeon: Vernetta Berg, MD;  Location: [facility] ORS;  Service: General;  Laterality: N/A;   gallbladder     PACEMAKER IMPLANT N/A 11/07/2021   Procedure: PACEMAKER IMPLANT;  Surgeon: Waddell Danelle ORN, MD;  Location: [facility] INVASIVE CV LAB;  Service: Cardiovascular;  Laterality: N/A;   REMOVAL OF PARATHYROID  GLAND     STRESS NUCLEAR STUDY     ABNORMAL   TONSILLECTOMY     Social History:   reports that she quit smoking about 55 years ago. Her smoking use included cigarettes. She started smoking about 69 years ago. She has a 14 pack-year smoking history. She has never used smokeless tobacco. She reports that she does not drink alcohol  and does not use drugs.  Allergies  Allergen Reactions   Aleve [Naproxen] Other (See Comments)   Azithromycin Other (See Comments)    Reaction not recalled   Penicillins Hives and Other (See Comments)    Bad reaction as a child after a shot of Penicillin   Pravastatin Other (See Comments)    Reaction not recalled   Sulfa Antibiotics Other (See Comments)   Sulfa Drugs Cross Reactors Other (See Comments)    Reaction not recalled- stated she can take it in small doses   Tape Itching and Other (See Comments)    Cannot wear for an extended period of time   Naproxen Sodium Rash    Family History  Problem Relation Age of Onset   Stroke Mother    Lung cancer Father    Pneumonia Brother    Breast cancer Neg Hx     Prior to Admission medications   Medication Sig Start Date End Date Taking? Authorizing Provider  acetaminophen  (TYLENOL ) 500 MG tablet Take 1 tablet (500 mg total) by mouth every 6 (six) hours as needed for mild pain (pain score 1-3) or headache. 09/27/23  Yes Cherlyn Labella, MD  amLODipine  (NORVASC ) 5 MG tablet Take 1 tablet (5 mg total) by mouth daily. 03/25/23  Yes Darlean Maus, NP  b complex vitamins capsule Take 1 capsule by mouth daily.   Yes [provider]  fluticasone  (FLONASE ) 50 MCG/ACT nasal spray Place 1-2 sprays into both nostrils daily.   Yes [provider]  guaiFENesin -dextromethorphan (ROBITUSSIN DM) 100-10 MG/5ML syrup Take 10 mLs by mouth every 6 (six) hours as needed for cough. 09/27/23  Yes Akula, Vijaya, MD  hydrALAZINE  (APRESOLINE ) 10 MG tablet Take 1 tablet (10 mg total) by mouth 2 (two) times daily as needed. Sbp >170 04/30/24  Yes Wert, Maus, NP  ipratropium-albuterol  (DUONEB) 0.5-2.5  (3) MG/3ML SOLN Take 3 mLs by nebulization every 6 (six) hours as needed (SOB/Wheezing).   Yes [provider]  Iron , Ferrous Sulfate , 325 (65 Fe) MG TABS Take 325 mg by mouth 3 (three) times a week. Patient taking differently: Take 325 mg by mouth 3 (three) times a week. On Mon, Wed, Fri 07/08/23  Yes Wert, Maus, NP  loperamide  (IMODIUM  A-D) 2 MG tablet Take 2 mg by mouth every 8 (eight) hours as needed for diarrhea or loose stools.   Yes [provider]  magnesium  oxide (MAG-OX) 400 (240 Mg) MG tablet Take 400 mg by mouth 2 (two) times daily.   Yes [provider]  ondansetron  (ZOFRAN -ODT) 4 MG disintegrating tablet Take 2 mg by mouth every 4 (four) hours as needed for nausea or vomiting. 02/20/22  Yes [provider]  PHENobarbital  (LUMINAL) 64.8 MG tablet Take 1 tablet (64.8 mg total) by mouth at bedtime. 04/09/24  Yes Wert, Tawni, NP  phenytoin  (DILANTIN ) 100 MG ER capsule Take 200 mg by mouth daily.   Yes [provider]  Polyethyl Glycol-Propyl Glycol (SYSTANE) 0.4-0.3 % SOLN Place 1 drop into both eyes in the morning and at bedtime.   Yes [provider]  torsemide  (DEMADEX ) 10 MG tablet Take 1 tablet (10 mg total) by mouth 3 (three) times a week. Patient taking differently: Take 10 mg by mouth 3 (three) times a week. On Mon, Wed, Fri; *Give 1 tablet orally every 24 hours as needed for Edema. Give as needed for 3lbs weight gain in 1 day or 5lbs weight gain in 1 week.* 11/15/23  Yes Wert, Tawni, NP  triamcinolone  cream (KENALOG ) 0.1 % Apply 1 Application topically 2 (two) times daily as needed. 07/08/23  Yes Darlean Tawni, NP  valsartan  (DIOVAN ) 160 MG tablet Take 1 tablet (160 mg total) by mouth 2 (two) times daily. 09/06/22  Yes Darlean Tawni, NP  Nutritional Supplements (BOOST ORIGINAL PO) Take by mouth as directed. Give 1 can orally one time a day    [provider]    Physical Exam: Vitals:   05/17/24 1300  05/17/24 1400 05/17/24 1500 05/17/24 1549  BP: (!) 167/52 (!) 174/47 (!) 173/43 (!) 173/43  Pulse: 64 65 66 (!) 59  Resp:  16 (!) 23 15  Temp:    99.6 F (37.6 C)  TempSrc:    Oral  SpO2: 97% 98% 99% 93%  Weight:      Height:       Physical Exam:  General: No acute distress, well developed, well nourished HEENT: Normocephalic, atraumatic, PERRL Cardiovascular: Normal rate and rhythm. Distal pulses intact. Pulmonary: Normal pulmonary effort, normal breath sounds Gastrointestinal: Nondistended abdomen, soft, non-tender, hypoactive bowel sounds Musculoskeletal:No lower ext pitting edema Left foot externally rotated Lymphadenopathy: No cervical LAD. Skin: Skin is warm and dry. Neuro: No focal deficits noted, AAOx3. PSYCH: Attentive and cooperative  Data Reviewed:  Results for orders placed or performed during the hospital encounter of 05/17/24 (from the past 24 hours)  Basic metabolic panel     Status: Abnormal   Collection Time: 05/17/24  1:14 PM  Result Value Ref Range   Sodium 140 135 - 145 mmol/L   Potassium 4.6 3.5 - 5.1 mmol/L   Chloride 106 98 - 111 mmol/L   CO2 23 22 - 32 mmol/L   Glucose, Bld 119 (H) 70 - 99 mg/dL   BUN 46 (H) 8 - 23 mg/dL   Creatinine, Ser 8.69 (H) 0.44 - 1.00 mg/dL   Calcium  9.7 8.9 - 10.3 mg/dL   GFR, Estimated 40 (L) >60 mL/min   Anion gap 11 5 - 15  CBC with Differential     Status: Abnormal   Collection Time: 05/17/24  1:14 PM  Result Value Ref Range   WBC 9.8 4.0 - 10.5 K/uL   RBC 2.63 (L) 3.87 - 5.11 MIL/uL   Hemoglobin 8.9 (L) 12.0 - 15.0 g/dL   HCT 72.8 (L) 63.9 - 53.9 %   MCV 103.0 (H) 80.0 - 100.0 fL   MCH 33.8 26.0 - 34.0 pg   MCHC 32.8 30.0 - 36.0 g/dL   RDW 86.4 88.4 - 84.4 %  Platelets 187 150 - 400 K/uL   nRBC 0.0 0.0 - 0.2 %   Neutrophils Relative % 83 %   Neutro Abs 8.0 (H) 1.7 - 7.7 K/uL   Lymphocytes Relative 5 %   Lymphs Abs 0.5 (L) 0.7 - 4.0 K/uL   Monocytes Relative 12 %   Monocytes Absolute 1.1 (H) 0.1 - 1.0  K/uL   Eosinophils Relative 0 %   Eosinophils Absolute 0.0 0.0 - 0.5 K/uL   Basophils Relative 0 %   Basophils Absolute 0.0 0.0 - 0.1 K/uL   Immature Granulocytes 0 %   Abs Immature Granulocytes 0.04 0.00 - 0.07 K/uL  Protime-INR     Status: None   Collection Time: 05/17/24  1:14 PM  Result Value Ref Range   Prothrombin Time 14.5 11.4 - 15.2 seconds   INR 1.1 0.8 - 1.2  Type and screen [facility]     Status: None   Collection Time: 05/17/24  1:14 PM  Result Value Ref Range   ABO/RH(D) A NEG    Antibody Screen NEG    Sample Expiration      05/20/2024,2359 Performed at [facility], [address], [place], [place] [address]   ABO/Rh     Status: None   Collection Time: 05/17/24  2:36 PM  Result Value Ref Range   ABO/RH(D)      A NEG Performed at [facility], [address], [place], [place] [address]    Echo 08/2022 IMPRESSIONS:   1. Left ventricular ejection fraction, by estimation, is 60 to 65%. The  left ventricle has normal function. The left ventricle has no regional  wall motion abnormalities. There is mild concentric left ventricular  hypertrophy. Left ventricular diastolic  function could not be evaluated. Elevated left ventricular end-diastolic  pressure.   2. Right ventricular systolic function is normal. The right ventricular  size is normal. There is severely elevated pulmonary artery systolic  pressure.   3. Left atrial size was severely dilated.   4. Right atrial size was severely dilated.   5. Mostly posterior pericardial effusion measuring 1.77 cm. Compared with  the echo 02/2022, the effusion is larger. No evidence of tamponade.  Moderate pericardial effusion. There is no evidence of cardiac tamponade.  Moderate pleural effusion in the left  lateral region.   6. The mitral valve is normal in structure. Trivial mitral valve  regurgitation. No evidence of mitral stenosis.   7. The aortic valve is  tricuspid. Aortic valve regurgitation is not  visualized. No aortic stenosis is present.   8. The inferior vena cava is normal in size with <50% respiratory  variability, suggesting right atrial pressure of 8 mmHg.   CT C Spine IMPRESSION: 1. No acute abnormality of the cervical spine related to the reported neck trauma. 2. Extensive diffuse degenerative changes throughout the cervical spine, including marked hypertrophic changes involving the atlantoaxial joint with bone overgrowth and cyst formation within the dens, and multilevel moderate-to-severe central spinal canal stenosis, greatest at C4-5.  Head CT IMPRESSION: 1. Partially calcified mass in anterior left frontal lobe with surrounding vasogenic edema, compatible with arteriovenous malformation, causing mass effect on anterior horn of left lateral ventricle. 2. Mucosal thickening in left maxillary sinus.   Assessment and Plan: Left femur fracture - Dr. Gonzella of [facility] is planning to do surgery in the morning.  Chronic Cerebral AVN with surrounding vasogenic edema/ history of postop seizures - Discussed with Dr. Maude Naegeli of  neurovascular intervention.  He agrees with consulting neurology about perioperative seizure risk management. No neurovascular intervention is needed at this time.  The vasogenic edema is not significantly changed from previously scans.  MRI would be needed to quantify minor changes. - Neurology has been consulted - They recommend continuing her baseline Dilantin  and nightly phenobarbital  but adding Keppra  perioperatively to hopefully prevent the postop seizures that she had after her last surgery.  Keppra  was used temporarily when the patient had postop seizures last time.  3. Htn - Will resume Norvasc . Hold Diovan  for now. She has prn Hydralazine  and Lasix  on her outpatient med list.    Advance Care Planning:   Code Status: Prior the patient names her daughter as her surrogate decision maker.   She has a yellow out of facility DNR form.  But she understands and is agreeable to being full code perioperatively.  Consults: Orthopedic surgery.  Neurology  Family Communication: Daughter is at bedside  Severity of Illness: The appropriate patient status for this patient is INPATIENT. Inpatient status is judged to be reasonable and necessary in order to provide the required intensity of service to ensure the patient's safety. The patient's presenting symptoms, physical exam findings, and initial radiographic and laboratory data in the context of their chronic comorbidities is felt to place them at high risk for further clinical deterioration. Furthermore, it is not anticipated that the patient will be medically stable for discharge from the hospital within 2 midnights of admission.   * I certify that at the point of admission it is my clinical judgment that the patient will require inpatient hospital care spanning beyond 2 midnights from the point of admission due to high intensity of service, high risk for further deterioration and high frequency of surveillance required.*  Author: ARTHEA CHILD, MD 05/17/2024 3:54 PM  For on call review [email or web address].

## 2024-05-17 NOTE — Plan of Care (Signed)
Discuss and review plan of care with patient/family  

## 2024-05-17 NOTE — ED Provider Notes (Addendum)
 I did talk with Dr. Maude Naegeli regarding finding on her head CT.  At this time this is very marginally changed from the past.  Does not seem like there is any need for any acute intervention from the standpoint but overall we will have Dr. Naegeli talk with hospitalist Dr. Arthea regarding medical clearance for surgery.  Please see their note regarding medical clearance for surgery.  At this time there is no acute intervention or need for any further workup of her CT head finding at this time per Dr. Naegeli.   Ruthe Cornet, DO 05/17/24 1602    Ruthe Cornet, DO 05/17/24 [phone]

## 2024-05-17 NOTE — ED Provider Notes (Signed)
 [facility] AT [facility] Provider Note   CSN: [id number] Arrival date & time: 05/17/24  1111     Patient presents with: [name]   [name] is a 88 y.o. female.  {Add pertinent medical, surgical, social history, OB history to HPI:32947} HPI     88 year old female comes in with chief complaint of mechanical fall.  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  Patient does not take any blood thinners.  Patient resides at an assisted living facility.  Patient fell last night, while getting up from the couch.  She fell onto the right side, and has elbow pain on the right side, but she also has pain on the left hip and she is having difficulty ambulating.    Prior to Admission medications   Medication Sig Start Date End Date Taking? Authorizing Provider  acetaminophen  (TYLENOL ) 500 MG tablet Take 1 tablet (500 mg total) by mouth every 6 (six) hours as needed for mild pain (pain score 1-3) or headache. 09/27/23   Cherlyn Labella, MD  amLODipine  (NORVASC ) 5 MG tablet Take 1 tablet (5 mg total) by mouth daily. 03/25/23   Wert, Christina, NP  b complex vitamins capsule Take 1 capsule by mouth daily.    [provider]  fluticasone  (FLONASE ) 50 MCG/ACT nasal spray Place 1-2 sprays into both nostrils daily.    [provider]  guaiFENesin -dextromethorphan (ROBITUSSIN DM) 100-10 MG/5ML syrup Take 10 mLs by mouth every 6 (six) hours as needed for cough. 09/27/23   Akula, Vijaya, MD  hydrALAZINE  (APRESOLINE ) 10 MG tablet Take 1 tablet (10 mg total) by mouth 2 (two) times daily as needed. Sbp >170 04/30/24   Darlean Maus, NP  ipratropium-albuterol  (DUONEB) 0.5-2.5 (3) MG/3ML SOLN Take 3 mLs by nebulization every 6 (six) hours as needed (SOB/Wheezing).    [provider]  Iron ,  Ferrous Sulfate , 325 (65 Fe) MG TABS Take 325 mg by mouth 3 (three) times a week. 07/08/23   Darlean Maus, NP  magnesium  oxide (MAG-OX) 400 (240 Mg) MG tablet Take 400 mg by mouth 2 (two) times daily.    [provider]  Nutritional Supplements (BOOST ORIGINAL PO) Take by mouth as directed. Give 1 can orally one time a day    [provider]  ondansetron  (ZOFRAN -ODT) 4 MG disintegrating tablet Take 2 mg by mouth every 4 (four) hours as needed for nausea or vomiting. 02/20/22   [provider]  PHENobarbital  (LUMINAL) 64.8 MG tablet Take 1 tablet (64.8 mg total) by mouth at bedtime. 04/09/24   Darlean Maus, NP  phenytoin  (DILANTIN ) 100 MG ER capsule Take 200 mg by mouth daily.    [provider]  Polyethyl Glycol-Propyl Glycol (SYSTANE) 0.4-0.3 % SOLN Place 1 drop into both eyes in the morning and at bedtime.    [provider]  torsemide  (DEMADEX ) 10 MG tablet Take 1 tablet (10 mg total) by mouth 3 (three) times a week. 11/15/23   Darlean Maus, NP  triamcinolone  cream (KENALOG ) 0.1 % Apply 1 Application topically 2 (two) times daily as needed. 07/08/23   Darlean Maus, NP  valsartan  (DIOVAN ) 160 MG tablet Take 1 tablet (160 mg total) by mouth 2 (two) times daily. 09/06/22   Darlean Maus, NP    Allergies: Aleve [naproxen], Azithromycin, Penicillins, Pravastatin, Sulfa antibiotics, Sulfa drugs cross  reactors, Tape, and Naproxen sodium    Review of Systems  All other systems reviewed and are negative.   Updated Vital Signs BP (!) 173/43 (BP Location: Left Arm)   Pulse 66   Temp 98.5 F (36.9 C) (Oral)   Resp (!) 23   Ht 5' 5 (1.651 m)   Wt 55.3 kg   SpO2 99%   BMI 20.30 kg/m   Physical Exam Vitals and nursing note reviewed.  Constitutional:      Appearance: She is well-developed.  HENT:     Head: Normocephalic and atraumatic.  Eyes:     Extraocular Movements: Extraocular movements intact.     Pupils: Pupils are equal, round, and  reactive to light.  Neck:     Comments: No midline c-spine tenderness Cardiovascular:     Rate and Rhythm: Normal rate and regular rhythm.  Pulmonary:     Effort: Pulmonary effort is normal. No respiratory distress.     Breath sounds: Normal breath sounds.  Chest:     Chest wall: No tenderness.  Abdominal:     General: Bowel sounds are normal. There is no distension.     Palpations: Abdomen is soft.     Tenderness: There is no abdominal tenderness.  Musculoskeletal:        General: Swelling and tenderness present. No deformity.     Cervical back: Neck supple.     Comments: Left lower extremity shortened patient has tenderness over the left hip  Skin:    General: Skin is warm and dry.  Neurological:     Mental Status: She is alert and oriented to person, place, and time.     Cranial Nerves: No cranial nerve deficit.     (all labs ordered are listed, but only abnormal results are displayed) Labs Reviewed  BASIC METABOLIC PANEL WITH GFR - Abnormal; Notable for the following components:      Result Value   Glucose, Bld 119 (*)    BUN 46 (*)    Creatinine, Ser 1.30 (*)    GFR, Estimated 40 (*)    All other components within normal limits  CBC WITH DIFFERENTIAL/PLATELET - Abnormal; Notable for the following components:   RBC 2.63 (*)    Hemoglobin 8.9 (*)    HCT 27.1 (*)    MCV 103.0 (*)    Neutro Abs 8.0 (*)    Lymphs Abs 0.5 (*)    Monocytes Absolute 1.1 (*)    All other components within normal limits  PROTIME-INR  TYPE AND SCREEN  ABO/RH    EKG: EKG Interpretation Date/Time:  Sunday May 17 2024 13:23:32 EST Ventricular Rate:  60 PR Interval:  189 QRS Duration:  138 QT Interval:  483 QTC Calculation: 483 R Axis:   114  Text Interpretation: Sinus rhythm Nonspecific intraventricular conduction delay Probable anterolateral infarct, age indeterm Confirmed by Charlyn Sora [phone]) on 05/17/2024 3:22:37 PM  Radiology: CT Cervical Spine Wo Contrast Result  Date: 05/17/2024 EXAM: CT CERVICAL SPINE WITHOUT CONTRAST 05/17/2024 12:53:59 PM TECHNIQUE: CT of the cervical spine was performed without the administration of intravenous contrast. Multiplanar reformatted images are provided for review. Automated exposure control, iterative reconstruction, and/or weight based adjustment of the mA/kV was utilized to reduce the radiation dose to as low as reasonably achievable. COMPARISON: CT of the cervical spine dated 10/15/2021. CLINICAL HISTORY: Neck trauma (Age >= 65y). FINDINGS: CERVICAL SPINE: BONES AND ALIGNMENT: Mild reversal of the normal cervical lordosis. No acute fracture or traumatic malalignment. DEGENERATIVE CHANGES:  Extensive diffuse degenerative changes throughout the cervical spine. Marked hypertrophic changes involving the atlantoaxial joint, with bone overgrowth and cyst formation within the dens. Moderate central spinal canal stenosis at the craniocervical junction. Posterior endplate ridging and ligamentum flavum thickening and calcification resulting in multilevel moderate-to-severe central spinal canal stenosis, which is greatest at C4-C5. Multilevel neuroforaminal stenosis. SOFT TISSUES: No prevertebral soft tissue swelling. Moderate calcification within the carotid bulbs. Mild biapical pleural scarring. IMPRESSION: 1. No acute abnormality of the cervical spine related to the reported neck trauma. 2. Extensive diffuse degenerative changes throughout the cervical spine, including marked hypertrophic changes involving the atlantoaxial joint with bone overgrowth and cyst formation within the dens, and multilevel moderate-to-severe central spinal canal stenosis, greatest at C4-5. Electronically signed by: Evalene Coho MD 05/17/2024 01:04 PM EST RP Workstation: [id number]   CT Head Wo Contrast Result Date: 05/17/2024 EXAM: CT HEAD WITHOUT CONTRAST 05/17/2024 12:53:59 PM TECHNIQUE: CT of the head was performed without the administration of intravenous  contrast. Automated exposure control, iterative reconstruction, and/or weight based adjustment of the mA/kV was utilized to reduce the radiation dose to as low as reasonably achievable. COMPARISON: CT of the head dated 04/25/2022. CLINICAL HISTORY: Head trauma, moderate-severe. FINDINGS: BRAIN AND VENTRICLES: No acute hemorrhage. No evidence of acute infarct. Partially calcified mass in anterior left frontal lobe with surrounding vasogenic edema remains compatible with arteriovenous malformation. Mass effect on anterior horn of left lateral ventricle. No hydrocephalus. No extra-axial collection. Atherosclerosis of skullbase vasculature without hyperdense vessel or abnormal calcification. ORBITS: No acute abnormality. SINUSES: Mucosal thickening in left maxillary sinus. SOFT TISSUES AND SKULL: No acute soft tissue abnormality. No skull fracture. IMPRESSION: 1. Partially calcified mass in anterior left frontal lobe with surrounding vasogenic edema, compatible with arteriovenous malformation, causing mass effect on anterior horn of left lateral ventricle. 2. Mucosal thickening in left maxillary sinus. Electronically signed by: Evalene Coho MD 05/17/2024 01:00 PM EST RP Workstation: [id number]   DG Hip Unilat With Pelvis 2-3 Views Left Result Date: 05/17/2024 EXAM: 2 or 3 VIEW(S) XRAY OF THE LEFT HIP 05/17/2024 12:36:00 PM COMPARISON: None available. CLINICAL HISTORY: fall FINDINGS: BONES AND JOINTS: There is an intertrochanteric fracture of the left femur with mild varus angulation. The bony pelvis appears to be intact. No malalignment. SOFT TISSUES: The soft tissues are unremarkable. LUMBAR SPINE: There is diffuse chronic degenerative disc disease throughout the lower lumbar spine and there is levoscoliotic curvature of the lumbar spine. IMPRESSION: 1. Intertrochanteric fracture of the left femur with mild varus angulation. 2. Intact bony pelvis. 3. Diffuse chronic degenerative disc disease throughout the lower  lumbar spine and levoscoliotic curvature of the lumbar spine. Electronically signed by: Evalene Coho MD 05/17/2024 12:55 PM EST RP Workstation: [id number]   DG Chest Port 1 View Result Date: 05/17/2024 EXAM: 1 VIEW(S) XRAY OF THE CHEST 05/17/2024 12:37:26 PM COMPARISON: 09/27/2023 CLINICAL HISTORY: fall FINDINGS: LINES, TUBES AND DEVICES: Left chest cardiac pacing device noted. LUNGS AND PLEURA: Stable left retrocardiac opacity. Stable left pleural effusion. No pneumothorax. HEART AND MEDIASTINUM: Cardiomegaly, unchanged. Atherosclerotic calcifications noted. BONES AND SOFT TISSUES: Severe degenerative changes of left shoulder. IMPRESSION: 1. Stable left retrocardiac opacity and left pleural effusion. 2. Cardiomegaly, unchanged, with left chest cardiac pacing device in place. Electronically signed by: Evalene Coho MD 05/17/2024 12:53 PM EST RP Workstation: [id number]   DG ELBOW COMPLETE RIGHT (3+VIEW) Result Date: 05/17/2024 EXAM: 3 VIEW(S) XRAY OF THE RIGHT ELBOW COMPARISON: None available. CLINICAL HISTORY: [id number] Fall [id number] FINDINGS: BONES AND JOINTS: The bones  appear osteopenic. There is mild chondrocalcinosis present within the radioulnar joint. There is enthesophyte formation along the olecranon. No acute fracture. No malalignment. A true lateral view of the elbow was not obtained. SOFT TISSUES: The soft tissues are unremarkable. IMPRESSION: 1. No acute fracture. 2. Mild chondrocalcinosis within the radioulnar joint. 3. Enthesophyte formation along the olecranon. 4. Osteopenic bones. Electronically signed by: Evalene Coho MD 05/17/2024 12:52 PM EST RP Workstation: [id number]    {Document cardiac monitor, telemetry assessment procedure when appropriate:32947} Procedures   Medications Ordered in the ED  fentaNYL  (SUBLIMAZE ) injection 50 mcg (has no administration in time range)      {Click here for ABCD2, HEART and other calculators REFRESH Note before signing:1}                               Medical Decision Making Amount and/or Complexity of Data Reviewed Labs: ordered. Radiology: ordered.  Risk Prescription drug management. Decision regarding hospitalization.   88 year old patient comes in after sustaining what appears to be a mechanical fall. Pertinent past medical includes left frontal AV malformation.  No blood thinner use. Collateral history provided by patient's daughter, was at the bedside.  Based on my history and exam, differential diagnosis includes: - Traumatic brain injury including intracranial hemorrhage - Long bone fractures - Contusions - Soft tissue injury - Concussion - Left hip fracture  Based on the initial assessment, the following workup was initiated CT scan of the head, C-spine and x-ray of the pelvis and chest.  I have independently interpreted the following imaging from the perspective of acute trauma: X-ray of the hip and the results indicate clear evidence of intertrochanteric fracture of the left hip.  I consulted orthopedic doctors, Dr. Marcey B.  Will operate on the patient tomorrow.  Patient CT scan of the brain had incidental finding of vasogenic edema due to the AV malformation.  I have consulted and spoken with Dr. Maude Naegeli.  Patient is not symptomatic from this finding.  She denies any new headaches, any new neurologic symptoms such as one-sided weakness, numbness, slurred speech.  Final recommendations pending from neurointerventional.  Final diagnoses:  Closed fracture of right hip, initial encounter [facility])    ED Discharge Orders     None

## 2024-05-17 NOTE — Consult Note (Signed)
 ORTHOPAEDIC CONSULTATION  REQUESTING PHYSICIAN: Charlyn Sora, MD  Chief Complaint: Left hip intertrochanteric fracture  HPI: [name] is a 88 y.o. female who presents with left displaced intertrochanteric hip fracture after a fall the night prior.  She was found to not be able to bear weight.  She subsequently presented to the emergency room.  She is also complaining of elbow pain as well.  Denies baseline elbow or hip pain.  She is otherwise a tourist information centre manager.  She is currently being worked up for osteoporosis  Past Medical History:  Diagnosis Date   Abnormal cardiovascular stress test    Carotid arterial disease    MODERATE RIGHT   Cerebral AV malformation    DDD (degenerative disc disease)    SEVERE WITH SPINAL STENOSIS   Hypercholesterolemia    Hypertension    MVP (mitral valve prolapse)    MILD   Numbness    LEFT FOOT AND ANKLE   Osteopenia    Presence of permanent cardiac pacemaker    Pseudo-gout    Past Surgical History:  Procedure Laterality Date   APPENDECTOMY     BREAST MASS EXCISION     CARPAL TUNNEL RELEASE     CHOLECYSTECTOMY N/A 04/23/2022   Procedure: LAPAROSCOPIC CHOLECYSTECTOMY;  Surgeon: Vernetta Berg, MD;  Location: [facility];  Service: General;  Laterality: N/A;   gallbladder     PACEMAKER IMPLANT N/A 11/07/2021   Procedure: PACEMAKER IMPLANT;  Surgeon: Waddell Danelle ORN, MD;  Location: [facility];  Service: Cardiovascular;  Laterality: N/A;   REMOVAL OF PARATHYROID  GLAND     STRESS NUCLEAR STUDY     ABNORMAL   TONSILLECTOMY     Social History   Socioeconomic History   Marital status: Widowed    Spouse name: Not on file   Number of children: 3   Years of education: Not on file   Highest education level: Not on file  Occupational History   Not on file  Tobacco Use   Smoking status: Former    Current packs/day: 0.00    Average packs/day: 1 pack/day for 14.0 years (14.0 ttl pk-yrs)    Types: Cigarettes    Start date:  11/08/1954    Quit date: 11/07/1968    Years since quitting: 55.5   Smokeless tobacco: Never  Vaping Use   Vaping status: Never Used  Substance and Sexual Activity   Alcohol  use: No   Drug use: No   Sexual activity: Not on file  Other Topics Concern   Not on file  Social History Narrative   [place] home [place] [employer]    Right handed    Social Drivers of Health   Financial Resource Strain: Not on file  Food Insecurity: No Food Insecurity (09/26/2023)   Hunger Vital Sign    Worried About Running Out of Food in the Last Year: Never true    Ran Out of Food in the Last Year: Never true  Transportation Needs: No Transportation Needs (09/26/2023)   PRAPARE - Administrator, Civil Service (Medical): No    Lack of Transportation (Non-Medical): No  Physical Activity: Not on file  Stress: Not on file  Social Connections: Moderately Integrated (09/26/2023)   Social Connection and Isolation Panel    Frequency of Communication with Friends and Family: Twice a week    Frequency of Social Gatherings with Friends and Family: Twice a week    Attends Religious Services: More than 4 times per year  Active Member of Clubs or Organizations: No    Attends Banker Meetings: 1 to 4 times per year    Marital Status: Widowed   Family History  Problem Relation Age of Onset   Stroke Mother    Lung cancer Father    Pneumonia Brother    Breast cancer Neg Hx    - negative except otherwise stated in the family history section Allergies  Allergen Reactions   Aleve [Naproxen] Other (See Comments)   Azithromycin Other (See Comments)    Reaction not recalled   Penicillins Hives and Other (See Comments)    Bad reaction as a child after a shot of Penicillin   Pravastatin Other (See Comments)    Reaction not recalled   Sulfa Antibiotics Other (See Comments)   Sulfa Drugs Cross Reactors Other (See Comments)    Reaction not recalled- stated she can take it in small doses    Tape Itching and Other (See Comments)    Cannot wear for an extended period of time   Naproxen Sodium Rash   Prior to Admission medications   Medication Sig Start Date End Date Taking? Authorizing Provider  acetaminophen  (TYLENOL ) 500 MG tablet Take 1 tablet (500 mg total) by mouth every 6 (six) hours as needed for mild pain (pain score 1-3) or headache. 09/27/23   Cherlyn Labella, MD  amLODipine  (NORVASC ) 5 MG tablet Take 1 tablet (5 mg total) by mouth daily. 03/25/23   Wert, Christina, NP  b complex vitamins capsule Take 1 capsule by mouth daily.    [provider]  fluticasone  (FLONASE ) 50 MCG/ACT nasal spray Place 1-2 sprays into both nostrils daily.    [provider]  guaiFENesin -dextromethorphan (ROBITUSSIN DM) 100-10 MG/5ML syrup Take 10 mLs by mouth every 6 (six) hours as needed for cough. 09/27/23   Akula, Vijaya, MD  hydrALAZINE  (APRESOLINE ) 10 MG tablet Take 1 tablet (10 mg total) by mouth 2 (two) times daily as needed. Sbp >170 04/30/24   Darlean Maus, NP  ipratropium-albuterol  (DUONEB) 0.5-2.5 (3) MG/3ML SOLN Take 3 mLs by nebulization every 6 (six) hours as needed (SOB/Wheezing).    [provider]  Iron , Ferrous Sulfate , 325 (65 Fe) MG TABS Take 325 mg by mouth 3 (three) times a week. 07/08/23   Darlean Maus, NP  magnesium  oxide (MAG-OX) 400 (240 Mg) MG tablet Take 400 mg by mouth 2 (two) times daily.    [provider]  Nutritional Supplements (BOOST ORIGINAL PO) Take by mouth as directed. Give 1 can orally one time a day    [provider]  ondansetron  (ZOFRAN -ODT) 4 MG disintegrating tablet Take 2 mg by mouth every 4 (four) hours as needed for nausea or vomiting. 02/20/22   [provider]  PHENobarbital  (LUMINAL) 64.8 MG tablet Take 1 tablet (64.8 mg total) by mouth at bedtime. 04/09/24   Wert, Christina, NP  phenytoin  (DILANTIN ) 100 MG ER capsule Take 200 mg by mouth daily.    [provider]  Polyethyl  Glycol-Propyl Glycol (SYSTANE) 0.4-0.3 % SOLN Place 1 drop into both eyes in the morning and at bedtime.    [provider]  torsemide  (DEMADEX ) 10 MG tablet Take 1 tablet (10 mg total) by mouth 3 (three) times a week. 11/15/23   Darlean Maus, NP  triamcinolone  cream (KENALOG ) 0.1 % Apply 1 Application topically 2 (two) times daily as needed. 07/08/23   Darlean Maus, NP  valsartan  (DIOVAN ) 160 MG tablet Take 1 tablet (160 mg total)  by mouth 2 (two) times daily. 09/06/22   Darlean Maus, NP   CT Cervical Spine Wo Contrast Result Date: 05/17/2024 EXAM: CT CERVICAL SPINE WITHOUT CONTRAST 05/17/2024 12:53:59 PM TECHNIQUE: CT of the cervical spine was performed without the administration of intravenous contrast. Multiplanar reformatted images are provided for review. Automated exposure control, iterative reconstruction, and/or weight based adjustment of the mA/kV was utilized to reduce the radiation dose to as low as reasonably achievable. COMPARISON: CT of the cervical spine dated 10/15/2021. CLINICAL HISTORY: Neck trauma (Age >= 65y). FINDINGS: CERVICAL SPINE: BONES AND ALIGNMENT: Mild reversal of the normal cervical lordosis. No acute fracture or traumatic malalignment. DEGENERATIVE CHANGES: Extensive diffuse degenerative changes throughout the cervical spine. Marked hypertrophic changes involving the atlantoaxial joint, with bone overgrowth and cyst formation within the dens. Moderate central spinal canal stenosis at the craniocervical junction. Posterior endplate ridging and ligamentum flavum thickening and calcification resulting in multilevel moderate-to-severe central spinal canal stenosis, which is greatest at C4-C5. Multilevel neuroforaminal stenosis. SOFT TISSUES: No prevertebral soft tissue swelling. Moderate calcification within the carotid bulbs. Mild biapical pleural scarring. IMPRESSION: 1. No acute abnormality of the cervical spine related to the reported neck trauma. 2. Extensive  diffuse degenerative changes throughout the cervical spine, including marked hypertrophic changes involving the atlantoaxial joint with bone overgrowth and cyst formation within the dens, and multilevel moderate-to-severe central spinal canal stenosis, greatest at C4-5. Electronically signed by: Evalene Coho MD 05/17/2024 01:04 PM EST RP Workstation: [id number]   CT Head Wo Contrast Result Date: 05/17/2024 EXAM: CT HEAD WITHOUT CONTRAST 05/17/2024 12:53:59 PM TECHNIQUE: CT of the head was performed without the administration of intravenous contrast. Automated exposure control, iterative reconstruction, and/or weight based adjustment of the mA/kV was utilized to reduce the radiation dose to as low as reasonably achievable. COMPARISON: CT of the head dated 04/25/2022. CLINICAL HISTORY: Head trauma, moderate-severe. FINDINGS: BRAIN AND VENTRICLES: No acute hemorrhage. No evidence of acute infarct. Partially calcified mass in anterior left frontal lobe with surrounding vasogenic edema remains compatible with arteriovenous malformation. Mass effect on anterior horn of left lateral ventricle. No hydrocephalus. No extra-axial collection. Atherosclerosis of skullbase vasculature without hyperdense vessel or abnormal calcification. ORBITS: No acute abnormality. SINUSES: Mucosal thickening in left maxillary sinus. SOFT TISSUES AND SKULL: No acute soft tissue abnormality. No skull fracture. IMPRESSION: 1. Partially calcified mass in anterior left frontal lobe with surrounding vasogenic edema, compatible with arteriovenous malformation, causing mass effect on anterior horn of left lateral ventricle. 2. Mucosal thickening in left maxillary sinus. Electronically signed by: Evalene Coho MD 05/17/2024 01:00 PM EST RP Workstation: [id number]   DG Hip Unilat With Pelvis 2-3 Views Left Result Date: 05/17/2024 EXAM: 2 or 3 VIEW(S) XRAY OF THE LEFT HIP 05/17/2024 12:36:00 PM COMPARISON: None available. CLINICAL HISTORY:  fall FINDINGS: BONES AND JOINTS: There is an intertrochanteric fracture of the left femur with mild varus angulation. The bony pelvis appears to be intact. No malalignment. SOFT TISSUES: The soft tissues are unremarkable. LUMBAR SPINE: There is diffuse chronic degenerative disc disease throughout the lower lumbar spine and there is levoscoliotic curvature of the lumbar spine. IMPRESSION: 1. Intertrochanteric fracture of the left femur with mild varus angulation. 2. Intact bony pelvis. 3. Diffuse chronic degenerative disc disease throughout the lower lumbar spine and levoscoliotic curvature of the lumbar spine. Electronically signed by: Evalene Coho MD 05/17/2024 12:55 PM EST RP Workstation: [id number]   DG Chest Port 1 View Result Date: 05/17/2024 EXAM: 1 VIEW(S) XRAY OF THE CHEST 05/17/2024 12:37:26 PM  COMPARISON: 09/27/2023 CLINICAL HISTORY: fall FINDINGS: LINES, TUBES AND DEVICES: Left chest cardiac pacing device noted. LUNGS AND PLEURA: Stable left retrocardiac opacity. Stable left pleural effusion. No pneumothorax. HEART AND MEDIASTINUM: Cardiomegaly, unchanged. Atherosclerotic calcifications noted. BONES AND SOFT TISSUES: Severe degenerative changes of left shoulder. IMPRESSION: 1. Stable left retrocardiac opacity and left pleural effusion. 2. Cardiomegaly, unchanged, with left chest cardiac pacing device in place. Electronically signed by: Evalene Coho MD 05/17/2024 12:53 PM EST RP Workstation: [id number]   DG ELBOW COMPLETE RIGHT (3+VIEW) Result Date: 05/17/2024 EXAM: 3 VIEW(S) XRAY OF THE RIGHT ELBOW COMPARISON: None available. CLINICAL HISTORY: [id number] Fall [id number] FINDINGS: BONES AND JOINTS: The bones appear osteopenic. There is mild chondrocalcinosis present within the radioulnar joint. There is enthesophyte formation along the olecranon. No acute fracture. No malalignment. A true lateral view of the elbow was not obtained. SOFT TISSUES: The soft tissues are unremarkable. IMPRESSION: 1. No  acute fracture. 2. Mild chondrocalcinosis within the radioulnar joint. 3. Enthesophyte formation along the olecranon. 4. Osteopenic bones. Electronically signed by: Evalene Coho MD 05/17/2024 12:52 PM EST RP Workstation: [id number]     Positive ROS: All other systems have been reviewed and were otherwise negative with the exception of those mentioned in the HPI and as above.  Physical Exam: General: No acute distress Cardiovascular: No pedal edema Respiratory: No cyanosis, no use of accessory musculature GI: No organomegaly, abdomen is soft and non-tender Skin: No lesions in the area of chief complaint Neurologic: Sensation intact distally Psychiatric: Patient is at baseline mood and affect Lymphatic: No axillary or cervical lymphadenopathy  MUSCULOSKELETAL:  Left hip is shortened externally rotated.  2+ [place] pedis pulse with sensation in all distributions intact  Independent Imaging Review: 3 views left hip: Displaced left intertrochanteric hip fracture  Assessment: 88 year old female with left displaced intertrochanteric hip fracture after a fall from standing.  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  She has active and has resulted in discuss surgical options.  At this time I would recommend left hip cephalomedullary nailing given her activity level.  Will plan for medical admission and consultation to ensure preoperative optimization and we will plan for n.p.o. at midnight in preparation for left hip cephalomedullary nailing on 12/8.  Plan: Plan for left hip cephalomedullary nailing 12/8, n.p.o. at midnight   After a lengthy discussion of treatment options, including risks, benefits, alternatives, complications of surgical and nonsurgical conservative options, the patient elected surgical repair.   The patient  is aware of the material risks  and complications including, but not limited to injury to adjacent structures, neurovascular injury, infection,  numbness, bleeding, implant failure, thermal burns, stiffness, persistent pain, failure to heal, disease transmission from allograft, need for further surgery, dislocation, anesthetic risks, blood clots, risks of death,and others. The probabilities of surgical success and failure discussed with patient given their particular co-morbidities.The time and nature of expected rehabilitation and recovery was discussed.The patient's questions were all answered preoperatively.  No barriers to understanding were noted. I explained the natural history of the disease process and Rx rationale.  I explained to the patient what I considered to be reasonable expectations given their personal situation.  The final treatment plan was arrived at through a shared patient decision making process model.   Thank you for the consult and the opportunity to see Ms. Kelsey Kelsey Parker, MD [facility] 1:27 PM

## 2024-05-17 NOTE — ED Triage Notes (Signed)
 Pt bib EMS from [facility]. Pt fell last night getting up from couch onto right side. Pt states the nurses were helping her up when she realized something was wrong with her left leg. Left leg has obvious shortening and outward rotation. Pt also complaining of right elbow pain from fall. Denies LOC, Head trauma, and denies thinners. Denies N/V/D.

## 2024-05-18 ENCOUNTER — Inpatient Hospital Stay (HOSPITAL_COMMUNITY)

## 2024-05-18 ENCOUNTER — Encounter (HOSPITAL_COMMUNITY): Payer: Self-pay | Admitting: Internal Medicine

## 2024-05-18 ENCOUNTER — Encounter (HOSPITAL_COMMUNITY)
Admission: EM | Disposition: A | Discharge status: Skilled Nursing Facility | Payer: Self-pay | Source: Skilled Nursing Facility | Attending: Internal Medicine

## 2024-05-18 DIAGNOSIS — S72142A Displaced intertrochanteric fracture of left femur, initial encounter for closed fracture: Secondary | ICD-10-CM | POA: Diagnosis present

## 2024-05-18 DIAGNOSIS — Z66 Do not resuscitate: Secondary | ICD-10-CM | POA: Diagnosis present

## 2024-05-18 DIAGNOSIS — N1832 Chronic kidney disease, stage 3b: Secondary | ICD-10-CM | POA: Diagnosis present

## 2024-05-18 HISTORY — PX: INTRAMEDULLARY (IM) NAIL INTERTROCHANTERIC: SHX5875

## 2024-05-18 LAB — ECHOCARDIOGRAM COMPLETE
AR max vel: 1.81 cm2
AV Area VTI: 1.75 cm2
AV Area mean vel: 1.72 cm2
AV Mean grad: 6 mmHg
AV Peak grad: 11.4 mmHg
Ao pk vel: 1.69 m/s
Area-P 1/2: 4.12 cm2
Calc EF: 59.2 %
Height: 65 in
MV VTI: 1.26 cm2
S' Lateral: 4.2 cm
Single Plane A2C EF: 58.7 %
Single Plane A4C EF: 59.2 %
Weight: 1940.05 [oz_av]

## 2024-05-18 LAB — BASIC METABOLIC PANEL WITH GFR
Anion gap: 10 (ref 5–15)
BUN: 44 mg/dL — ABNORMAL HIGH (ref 8–23)
CO2: 23 mmol/L (ref 22–32)
Calcium: 9.6 mg/dL (ref 8.9–10.3)
Chloride: 106 mmol/L (ref 98–111)
Creatinine, Ser: 1.22 mg/dL — ABNORMAL HIGH (ref 0.44–1.00)
GFR, Estimated: 43 mL/min — ABNORMAL LOW (ref >60)
Glucose, Bld: 127 mg/dL — ABNORMAL HIGH (ref 70–99)
Potassium: 4.2 mmol/L (ref 3.5–5.1)
Sodium: 139 mmol/L (ref 135–145)

## 2024-05-18 LAB — CBC
HCT: 25.8 % — ABNORMAL LOW (ref 36.0–46.0)
Hemoglobin: 8.3 g/dL — ABNORMAL LOW (ref 12.0–15.0)
MCH: 33.7 pg (ref 26.0–34.0)
MCHC: 32.2 g/dL (ref 30.0–36.0)
MCV: 104.9 fL — ABNORMAL HIGH (ref 80.0–100.0)
Platelets: 143 K/uL — ABNORMAL LOW (ref 150–400)
RBC: 2.46 MIL/uL — ABNORMAL LOW (ref 3.87–5.11)
RDW: 13.4 % (ref 11.5–15.5)
WBC: 9.5 K/uL (ref 4.0–10.5)
nRBC: 0 % (ref 0.0–0.2)

## 2024-05-18 LAB — SURGICAL PCR SCREEN
MRSA, PCR: NEGATIVE
Staphylococcus aureus: POSITIVE — AB

## 2024-05-18 SURGERY — FIXATION, FRACTURE, INTERTROCHANTERIC, WITH INTRAMEDULLARY ROD
Anesthesia: General | Laterality: Left

## 2024-05-18 MED ORDER — FENTANYL CITRATE (PF) 100 MCG/2ML IJ SOLN
INTRAMUSCULAR | Status: AC
  Filled 2024-05-18: qty 2

## 2024-05-18 MED ORDER — ONDANSETRON HCL 4 MG/2ML IJ SOLN: 4.0000 mg | Freq: Once | INTRAMUSCULAR | Status: DC | PRN

## 2024-05-18 MED ORDER — TRANEXAMIC ACID-NACL 1000-0.7 MG/100ML-% IV SOLN
INTRAVENOUS | Status: AC
  Filled 2024-05-18: qty 100

## 2024-05-18 MED ORDER — PROPOFOL 1000 MG/100ML IV EMUL
INTRAVENOUS | Status: AC
  Filled 2024-05-18: qty 100

## 2024-05-18 MED ORDER — CEFAZOLIN SODIUM-DEXTROSE 2-4 GM/100ML-% IV SOLN
INTRAVENOUS | Status: AC
  Filled 2024-05-18: qty 100

## 2024-05-18 MED ORDER — STERILE WATER FOR IRRIGATION IR SOLN
Status: DC | PRN
  Administered 2024-05-18: 2000 mL

## 2024-05-18 MED ORDER — FENTANYL CITRATE (PF) 250 MCG/5ML IJ SOLN
INTRAMUSCULAR | Status: DC | PRN
  Administered 2024-05-18 (×2): 50 ug via INTRAVENOUS

## 2024-05-18 MED ORDER — FLUTICASONE PROPIONATE 50 MCG/ACT NA SUSP
1.0000 | Freq: Every day | NASAL | Status: DC
  Administered 2024-05-18 – 2024-05-20 (×3): 1 via NASAL
  Filled 2024-05-18: qty 16

## 2024-05-18 MED ORDER — PROPOFOL 10 MG/ML IV BOLUS
INTRAVENOUS | Status: DC | PRN
  Administered 2024-05-18: 80 mg via INTRAVENOUS

## 2024-05-18 MED ORDER — ONDANSETRON HCL 4 MG/2ML IJ SOLN
INTRAMUSCULAR | Status: AC
  Filled 2024-05-18: qty 2

## 2024-05-18 MED ORDER — PROPOFOL 10 MG/ML IV BOLUS
INTRAVENOUS | Status: AC
  Filled 2024-05-18: qty 20

## 2024-05-18 MED ORDER — VANCOMYCIN HCL 1000 MG IV SOLR
INTRAVENOUS | Status: AC
  Filled 2024-05-18: qty 20

## 2024-05-18 MED ORDER — ROCURONIUM BROMIDE 10 MG/ML (PF) SYRINGE
PREFILLED_SYRINGE | INTRAVENOUS | Status: AC
  Filled 2024-05-18: qty 10

## 2024-05-18 MED ORDER — ACETAMINOPHEN 10 MG/ML IV SOLN: 1000.0000 mg | Freq: Once | INTRAVENOUS | Status: DC | PRN

## 2024-05-18 MED ORDER — POLYETHYL GLYCOL-PROPYL GLYCOL 0.4-0.3 % OP SOLN: 1.0000 [drp] | Freq: Two times a day (BID) | OPHTHALMIC | Status: DC

## 2024-05-18 MED ORDER — CEFAZOLIN SODIUM-DEXTROSE 2-4 GM/100ML-% IV SOLN
2.0000 g | Freq: Three times a day (TID) | INTRAVENOUS | Status: AC
  Administered 2024-05-18 – 2024-05-19 (×2): 2 g via INTRAVENOUS
  Filled 2024-05-18 (×2): qty 100

## 2024-05-18 MED ORDER — PROPOFOL 500 MG/50ML IV EMUL
INTRAVENOUS | Status: DC | PRN
  Administered 2024-05-18: 100 ug/kg/min via INTRAVENOUS
  Administered 2024-05-18: 125 ug/kg/min via INTRAVENOUS

## 2024-05-18 MED ORDER — ROCURONIUM BROMIDE 10 MG/ML (PF) SYRINGE
PREFILLED_SYRINGE | INTRAVENOUS | Status: DC | PRN
  Administered 2024-05-18: 50 mg via INTRAVENOUS

## 2024-05-18 MED ORDER — FENTANYL CITRATE (PF) 50 MCG/ML IJ SOSY: 25.0000 ug | PREFILLED_SYRINGE | INTRAMUSCULAR | Status: DC | PRN

## 2024-05-18 MED ORDER — IRBESARTAN 150 MG PO TABS
150.0000 mg | ORAL_TABLET | Freq: Every day | ORAL | Status: DC
  Administered 2024-05-18 – 2024-05-20 (×3): 150 mg via ORAL
  Filled 2024-05-18 (×3): qty 1

## 2024-05-18 MED ORDER — CEFAZOLIN SODIUM-DEXTROSE 2-4 GM/100ML-% IV SOLN
2.0000 g | Freq: Once | INTRAVENOUS | Status: AC
  Administered 2024-05-18: 2 g via INTRAVENOUS

## 2024-05-18 MED ORDER — 0.9 % SODIUM CHLORIDE (POUR BTL) OPTIME
TOPICAL | Status: DC | PRN
  Administered 2024-05-18: 1000 mL

## 2024-05-18 MED ORDER — SUGAMMADEX SODIUM 200 MG/2ML IV SOLN
INTRAVENOUS | Status: AC
  Filled 2024-05-18: qty 2

## 2024-05-18 MED ORDER — LACTATED RINGERS IV SOLN: INTRAVENOUS | Status: DC

## 2024-05-18 MED ORDER — LIDOCAINE HCL (CARDIAC) PF 100 MG/5ML IV SOSY
PREFILLED_SYRINGE | INTRAVENOUS | Status: DC | PRN
  Administered 2024-05-18: 100 mg via INTRAVENOUS

## 2024-05-18 MED ORDER — OXYCODONE HCL 5 MG PO TABS: 5.0000 mg | ORAL_TABLET | Freq: Once | ORAL | Status: DC | PRN

## 2024-05-18 MED ORDER — OXYCODONE HCL 5 MG/5ML PO SOLN: 5.0000 mg | Freq: Once | ORAL | Status: DC | PRN

## 2024-05-18 MED ORDER — AMISULPRIDE (ANTIEMETIC) 5 MG/2ML IV SOLN: 10.0000 mg | Freq: Once | INTRAVENOUS | Status: DC | PRN

## 2024-05-18 MED ORDER — TRANEXAMIC ACID-NACL 1000-0.7 MG/100ML-% IV SOLN
1000.0000 mg | INTRAVENOUS | Status: AC
  Administered 2024-05-18: 1000 mg via INTRAVENOUS

## 2024-05-18 MED ORDER — PHENYLEPHRINE HCL-NACL 20-0.9 MG/250ML-% IV SOLN
INTRAVENOUS | Status: DC | PRN
  Administered 2024-05-18: 20 ug/min via INTRAVENOUS

## 2024-05-18 MED ORDER — ONDANSETRON HCL 4 MG/2ML IJ SOLN
INTRAMUSCULAR | Status: DC | PRN
  Administered 2024-05-18: 4 mg via INTRAVENOUS

## 2024-05-18 MED ORDER — LIDOCAINE HCL (PF) 2 % IJ SOLN
INTRAMUSCULAR | Status: AC
  Filled 2024-05-18: qty 5

## 2024-05-18 MED ORDER — POLYVINYL ALCOHOL 1.4 % OP SOLN
1.0000 [drp] | Freq: Two times a day (BID) | OPHTHALMIC | Status: DC
  Administered 2024-05-18 – 2024-05-20 (×4): 1 [drp] via OPHTHALMIC
  Filled 2024-05-18: qty 15

## 2024-05-18 MED ORDER — DEXAMETHASONE SOD PHOSPHATE PF 10 MG/ML IJ SOLN
INTRAMUSCULAR | Status: DC | PRN
  Administered 2024-05-18: 10 mg via INTRAVENOUS

## 2024-05-18 MED ORDER — CHLORHEXIDINE GLUCONATE CLOTH 2 % EX PADS
6.0000 | MEDICATED_PAD | Freq: Every day | CUTANEOUS | Status: DC
  Administered 2024-05-18 – 2024-05-20 (×3): 6 via TOPICAL

## 2024-05-18 MED ORDER — CHLORHEXIDINE GLUCONATE 0.12 % MT SOLN
15.0000 mL | Freq: Once | OROMUCOSAL | Status: AC
  Administered 2024-05-18: 15 mL via OROMUCOSAL

## 2024-05-18 MED ORDER — ASPIRIN 325 MG PO TABS
325.0000 mg | ORAL_TABLET | Freq: Every day | ORAL | Status: DC
  Administered 2024-05-19 – 2024-05-20 (×2): 325 mg via ORAL
  Filled 2024-05-18 (×2): qty 1

## 2024-05-18 MED ORDER — SUGAMMADEX SODIUM 200 MG/2ML IV SOLN
INTRAVENOUS | Status: DC | PRN
  Administered 2024-05-18: 200 mg via INTRAVENOUS

## 2024-05-18 SURGICAL SUPPLY — 39 items
BAG COUNTER SPONGE SURGICOUNT (BAG) IMPLANT
BIT DRILL CROWE POINT TWST 4.3 (DRILL) IMPLANT
CHLORAPREP W/TINT 26 (MISCELLANEOUS) ×1 IMPLANT
COVER PERINEAL POST (MISCELLANEOUS) ×1 IMPLANT
COVER SURGICAL LIGHT HANDLE (MISCELLANEOUS) ×1 IMPLANT
DRAPE C-ARM 42X120 X-RAY (DRAPES) ×1 IMPLANT
DRAPE C-ARMOR (DRAPES) ×1 IMPLANT
DRAPE STERI IOBAN 125X83 (DRAPES) ×1 IMPLANT
DRESSING MEPILEX FLEX 4X4 (GAUZE/BANDAGES/DRESSINGS) ×2 IMPLANT
DRSG MEPILEX POST OP 4X8 (GAUZE/BANDAGES/DRESSINGS) IMPLANT
DRSG TEGADERM 4X4.75 (GAUZE/BANDAGES/DRESSINGS) IMPLANT
ELECT REM PT RETURN 15FT ADLT (MISCELLANEOUS) ×1 IMPLANT
GAUZE SPONGE 4X4 12PLY STRL (GAUZE/BANDAGES/DRESSINGS) IMPLANT
GAUZE XEROFORM 5X9 LF (GAUZE/BANDAGES/DRESSINGS) ×1 IMPLANT
GLOVE BIO SURGEON STRL SZ 6 (GLOVE) ×1 IMPLANT
GLOVE BIO SURGEON STRL SZ7.5 (GLOVE) ×1 IMPLANT
GLOVE BIOGEL PI IND STRL 6.5 (GLOVE) ×1 IMPLANT
GLOVE BIOGEL PI IND STRL 8 (GLOVE) ×1 IMPLANT
GLOVE ECLIPSE 8.0 STRL XLNG CF (GLOVE) IMPLANT
GLOVE SURG SYN 7.5 PF PI (GLOVE) ×1 IMPLANT
GOWN STRL REUS W/ TWL LRG LVL3 (GOWN DISPOSABLE) ×1 IMPLANT
GUIDEPIN VERSANAIL DSP 3.2X444 (ORTHOPEDIC DISPOSABLE SUPPLIES) IMPLANT
GUIDEWIRE BALL NOSE 100CM (WIRE) IMPLANT
KIT BASIN OR (CUSTOM PROCEDURE TRAY) ×1 IMPLANT
KIT TURNOVER KIT A (KITS) ×1 IMPLANT
MANIFOLD NEPTUNE II (INSTRUMENTS) ×1 IMPLANT
NAIL HIP AFFIXUS 11X360 125D (Nail) IMPLANT
NS IRRIG 1000ML POUR BTL (IV SOLUTION) ×1 IMPLANT
PACK GENERAL/GYN (CUSTOM PROCEDURE TRAY) ×1 IMPLANT
PROTECTOR NERVE ULNAR (MISCELLANEOUS) ×1 IMPLANT
SCREW BONE CORTICAL 5.0X44 (Screw) IMPLANT
SCREW CORTICL BON 5.0MM X 46MM (Screw) IMPLANT
SCREW LAG HIP FRA NAIL 10.5X90 (Orthopedic Implant) IMPLANT
SET IRRIG Y-TYPE CYSTO (SET/KITS/TRAYS/PACK) ×1 IMPLANT
STAPLER SKIN PROX 35W (STAPLE) ×1 IMPLANT
SUT VIC AB 0 CT1 36 (SUTURE) ×2 IMPLANT
SUT VIC AB 1 CT1 36 (SUTURE) ×1 IMPLANT
SUT VIC AB 2-0 CT1 TAPERPNT 27 (SUTURE) ×1 IMPLANT
TOWEL OR DSP ST BLU DLX 10/PK (DISPOSABLE) ×1 IMPLANT

## 2024-05-18 NOTE — Assessment & Plan Note (Signed)
 STAT echo requested by anesthesia related to prior h/o pericardial effusion and pulmonary HTN Echo 12/8 with presented ED and grade 3 diastolic CHF Moderate pulmonary HTN, small pericardial effusion present Appears compensated

## 2024-05-18 NOTE — Assessment & Plan Note (Deleted)
 Dr. Gonzella of Ortho care is planning to do surgery in the morning.

## 2024-05-18 NOTE — Assessment & Plan Note (Addendum)
 Amlodipine  continued on presentation Elevated BP today so irbesartan  (valsartan  per formulary) is resumed She has prn Hydralazine  on her home med list, not ordered

## 2024-05-18 NOTE — Assessment & Plan Note (Signed)
 Wound 05/17/24 1948 Pressure Injury Coccyx Right;Left;Medial Stage 1 -  Intact skin with non-blanchable redness of a localized area usually over a bony prominence. (Active)  POA

## 2024-05-18 NOTE — Brief Op Note (Signed)
   Brief Op Note  Date of Surgery: 05/18/2024  Preoperative Diagnosis: LEFT HIP FRACTURE  Postoperative Diagnosis: same  Procedure: Procedure(s): INTERTROCHANTERIC FRACTURE FIXATION WITH INTRAMEDULLARY ROD  Implants: Implant Name Type Inv. Item Serial No. Manufacturer Lot No. LRB No. Used Action  NAIL HIP AFFIXUS [id number] [id number] - [id number] Nail NAIL HIP AFFIXUS [id number] [id number]  [employer](ORTH,TRAU,BIO,SG) [id number] Left 1 Implanted  SCREW LAG HIP FRA NAIL 10.5X90 - [id number] Orthopedic Implant SCREW LAG HIP FRA NAIL 10.5X90  [employer](ORTH,TRAU,BIO,SG) [id number] Left 1 Implanted  SCREW BONE CORTICAL 5.0X44 - [id number] Screw SCREW BONE CORTICAL 5.0X44  [employer]IMMER [employer]ON(ORTH,TRAU,BIO,SG) [id number] Left 1 Implanted    Surgeons: Surgeon(s): Genelle Standing, MD  Anesthesia: Choice    Estimated Blood Loss: See anesthesia record  Complications: None  Condition to PACU: Stable  Standing LITTIE Genelle, MD 05/18/2024 4:36 PM

## 2024-05-18 NOTE — Progress Notes (Signed)
 Progress Note   Patient: [name] FMW:[id number] DOB: Jan 08, 1936 DOA: 05/17/2024     1 DOS: the patient was seen and examined on 05/18/2024   Brief hospital course: 88yo with h/o stage 3b CKD, afib on on AC due to cerebral AVM, pacemaker, CAD, MCI, and chronic HFpEF who presented on 12/7 with a mechanical fall.  She has a left intertrochanteric femur fracture.  Head CT with known AVM with surrounding vasogenic edema causing some mass effect on the anterior horn of the L ventricle; neurovascular interventionalist did not think acute intervention was needed.  Orthopedics is planning ORIF 12/8.    Assessment & Plan Closed displaced intertrochanteric fracture of left femur (HCC) L intertrochanteric femur fracture  Dr. Gonzella of Ortho care is planning to do ORIF today Pain control with Tylenol , Robaxin , Oxycodone , and Morphine  prn TOC team consult for rehab placement Will need PT consult post-operatively Hip fracture order set utilized TXA per orthopedics Cerebral AV malformation Partial symptomatic epilepsy with complex partial seizures, not intractable, without status epilepticus (HCC) Discussed with Dr. Maude Naegeli of neurovascular intervention Neurology consulted about perioperative seizure risk management; recommend continuing her baseline Dilantin  and nightly phenobarbital  but adding Keppra  perioperatively to hopefully prevent the postop seizures that she had after her last surgery Will add Keppra  for a week and then taper off No neurovascular intervention is needed at this time - the vasogenic edema is not significantly changed from previous scans Chronic diastolic CHF (congestive heart failure) (HCC) MVP (mitral valve prolapse) STAT echo requested by anesthesia related to prior h/o pericardial effusion and pulmonary HTN Echo 12/8 with presented ED and grade 3 diastolic CHF Moderate pulmonary HTN, small pericardial effusion present Appears compensated Paroxysmal atrial  fibrillation (HCC) Pacemaker Rate controlled with medication Pacer in place Not an AC candidate Pressure injury of skin Wound 05/17/24 1948 Pressure Injury Coccyx Right;Left;Medial Stage 1 -  Intact skin with non-blanchable redness of a localized area usually over a bony prominence. (Active)  POA Hypertension Amlodipine  continued on presentation Elevated BP today so irbesartan  (valsartan  per formulary) is resumed She has prn Hydralazine  on her home med list, not ordered Chronic kidney disease, stage 3b (HCC) Appears to be stable at this time Attempt to avoid nephrotoxic medications Recheck BMP in AM  DNR (do not resuscitate) DNR confirmed at the time of admission [facility] documents reviewed Patient has a gold out of facility DNR form      Consultants: Orthopedics Neurosurgery by telephone only  Procedures: ORIF 12/8  Antibiotics: None  30 Day Unplanned Readmission Risk Score    Flowsheet Row ED to Hosp-Admission (Current) from 05/17/2024 in [place] [facility]  30 Day Unplanned Readmission Risk Score (%) 15.48 Filed at 05/18/2024 0400    This score is the patient's risk of an unplanned readmission within 30 days of being discharged (0 -100%). The score is based on dignosis, age, lab data, medications, orders, and past utilization.   Low:  0-14.9   Medium: 15-21.9   High: 22-29.9   Extreme: 30 and above           Subjective: Sleeping this AM.  Daughter reports good pain control, no concerns.   Objective: Vitals:   05/18/24 1715 05/18/24 1730  BP: (!) 164/39 (!) 166/40  Pulse: (!) 59 60  Resp: 19 20  Temp:    SpO2: 98% 99%    Intake/Output Summary (Last 24 hours) at 05/18/2024 1741 Last data filed at 05/18/2024 1649 Gross per 24 hour  Intake 1219.43  ml  Output 425 ml  Net 794.43 ml   Filed Weights   05/17/24 1125 05/18/24 1236  Weight: 55.3 kg 55 kg    Exam:  General:  Appears calm and comfortable and is in NAD, sleeping comfortably Eyes:   normal lids, closed throughout ENT:  grossly normal hearing, lips & tongue, mmm; artificial dentition Cardiovascular:  RRR with 3/6 systolic mrumur. No LE edema.  Respiratory:   CTA bilaterally with no wheezes/rales/rhonchi.  Normal respiratory effort. Abdomen:  soft, NT, ND Skin:  no rash or induration seen on limited exam Musculoskeletal:  LLE shortened and externally rotated Psychiatric:  somnolent mood and affect, speech fluent and appropriate, AOx2 (thought it was 2015) Neurologic:  CN 2-12 grossly intact, moves all extremities in coordinated fashion other than LLE  Data Reviewed: I have reviewed the patient's lab results since admission.  Pertinent labs for today include:   Glucose 127 BUN 44/Creatinine 1.22/GFR 43, stable WBC 9.5 Hgb 8.3 Platelets 143     Family Communication: Daughter was present  Mobility: PT/OT Consulted      Code Status: Do not attempt resuscitation (DNR) PRE-ARREST INTERVENTIONS DESIRED   Disposition: Status is: Inpatient Remains inpatient appropriate because: ongoing management     Time spent: 50 minutes  Unresulted Labs (From admission, onward)     Start     Ordered   05/19/24 0500  Basic metabolic panel with GFR  Tomorrow morning,   R        05/18/24 1726   05/19/24 0500  CBC  Tomorrow morning,   R        05/18/24 1726             Author: Delon Herald, MD 05/18/2024 5:41 PM  For on call review [email or web address].

## 2024-05-18 NOTE — Transfer of Care (Signed)
 Immediate Anesthesia Transfer of Care Note  Patient: [name]  Procedure(s) Performed: INTERTROCHANTERIC FRACTURE FIXATION WITH INTRAMEDULLARY ROD (Left)  Patient Location: PACU  Anesthesia Type:General  Level of Consciousness: awake  Airway & Oxygen  Therapy: Patient Spontanous Breathing and Patient connected to nasal cannula oxygen   Post-op Assessment: Report given to RN and Post -op Vital signs reviewed and stable  Post vital signs: Reviewed and stable  Last Vitals:  Vitals Value Taken Time  BP 142/50 05/18/24 16:45  Temp    Pulse 62 05/18/24 16:48  Resp 21 05/18/24 16:48  SpO2 98 % 05/18/24 16:48  Vitals shown include unfiled device data.  Last Pain:  Vitals:   05/18/24 1236  TempSrc:   PainSc: 0-No pain      Patients Stated Pain Goal: 2 (05/18/24 9380)  Complications: No notable events documented.

## 2024-05-18 NOTE — Assessment & Plan Note (Signed)
 DNR confirmed at the time of admission [facility] documents reviewed Patient has a gold out of facility DNR form

## 2024-05-18 NOTE — Hospital Course (Signed)
 Brief Narrative:   88yo with h/o stage 3b CKD, afib on on AC due to cerebral AVM, pacemaker, CAD, MCI, and chronic HFpEF who presented on 12/7 with a mechanical fall. She has a left intertrochanteric femur fracture. Head CT with known AVM with surrounding vasogenic edema causing some mass effect on the anterior horn of the L ventricle; neurovascular interventionalist did not think acute intervention was needed. Orthopedics performed ORIF 12/8.  Neurology recommended continuing home Dilantin  and phenobarbital  and adding perioperative Keppra . Patient performed well postoperatively.  PT/OT recommended SNF.  Due to drowsiness Keppra  was reduced and will be tapered off over next 3 days as this was only started perioperatively per neurology recommendations.  Medically stable for discharge to SNF Voiding trial in 1 week if not she will need follow-up with urology  Assessment & Plan:   Closed displaced intertrochanteric fracture of left femur (HCC) Status post IM nail by orthopedic on 12/8.  Postop management per orthopedic.  Recommending daily full dose of aspirin , weightbearing as tolerated.  Did require 1 unit of PRBC transfusion.  Cerebral AV malformation Partial symptomatic epilepsy with complex partial seizures, not intractable, without status epilepticus [facility]) Prior provider discussed with Dr. Maude Naegeli of neurovascular intervention, recommended to continue phenobarbital  and Dilantin .  Taper Keppra  over next 3 days  ABLA (acute blood loss anemia) Baseline chronic anemia, Hgb 8.9 on presentation Postop due to some blood loss required 1 unit PRBC transfusion on 12/9  Acute urinary retention Recurrent retention issue during this hospitalization requiring Foley catheter placement.  Will discharge her with a catheter with 1 week urology follow-up.  Chronic diastolic CHF (congestive heart failure) (HCC) MVP (mitral valve prolapse) STAT echo requested by anesthesia related to prior h/o  pericardial effusion and pulmonary HTN Echo 12/8 with presented ED and grade 3 diastolic CHF Moderate pulmonary HTN, small pericardial effusion present  Paroxysmal atrial fibrillation (HCC) Pacemaker Appears to be rate controlled, pacer in place.  Not on anticoagulation candidate  Pressure injury of skin Wound 05/17/24 1948 Pressure Injury Coccyx Right;Left;Medial Stage 1 -  Intact skin with non-blanchable redness of a localized area usually over a bony prominence. (Active)  POA  Hypertension Continue Norvasc , ARB.  IV as needed  Chronic kidney disease, stage 3b (HCC) Creatinine around baseline of 1.3  DNR (do not resuscitate) DNR in place   Consultants: Orthopedics Neurosurgery by telephone only   Procedures: ORIF 12/8   Antibiotics: None  DVT prophylaxis: Place and maintain sequential compression device Start: 05/20/24 0144    Code Status: Do not attempt resuscitation (DNR) PRE-ARREST INTERVENTIONS DESIRED Family Communication:   Status is: Inpatient Remains inpatient appropriate because: Hopefully discharge back to her facility today   PT Follow up Recs:   Subjective:  Feels well no complaints  Examination:  General exam: Appears calm and comfortable  Respiratory system: Clear to auscultation. Respiratory effort normal. Cardiovascular system: S1 & S2 heard, RRR. No JVD, murmurs, rubs, gallops or clicks. No pedal edema. Gastrointestinal system: Abdomen is nondistended, soft and nontender. No organomegaly or masses felt. Normal bowel sounds heard. Central nervous system: Alert and oriented. No focal neurological deficits. Extremities: Symmetric 5 x 5 power.  Limited range of motion of her right elbow due to pain and discomfort Skin: No rashes, lesions or ulcers Psychiatry: Judgement and insight appear normal. Mood & affect appropriate.

## 2024-05-18 NOTE — Assessment & Plan Note (Signed)
 L intertrochanteric femur fracture  Dr. Gonzella of Ortho care is planning to do ORIF today Pain control with Tylenol , Robaxin , Oxycodone , and Morphine  prn TOC team consult for rehab placement Will need PT consult post-operatively Hip fracture order set utilized TXA per orthopedics

## 2024-05-18 NOTE — Assessment & Plan Note (Signed)
 Rate controlled with medication Pacer in place Not an [facility] candidate

## 2024-05-18 NOTE — Assessment & Plan Note (Addendum)
 Discussed with Dr. Maude Naegeli of neurovascular intervention Neurology consulted about perioperative seizure risk management; recommend continuing her baseline Dilantin  and nightly phenobarbital  but adding Keppra  perioperatively to hopefully prevent the postop seizures that she had after her last surgery Will add Keppra  for a week and then taper off No neurovascular intervention is needed at this time - the vasogenic edema is not significantly changed from previous scans

## 2024-05-18 NOTE — Progress Notes (Signed)
  Echocardiogram 2D Echocardiogram has been performed.  [name] 05/18/2024, 1:15 PM

## 2024-05-18 NOTE — Anesthesia Preprocedure Evaluation (Addendum)
 Anesthesia Evaluation  Patient identified by MRN, date of birth, ID band Patient awake    Reviewed: Allergy & Precautions, NPO status , Patient's Chart, lab work & pertinent test results  History of Anesthesia Complications Negative for: history of anesthetic complications  Airway Mallampati: II  TM Distance: >3 FB Neck ROM: Full    Dental  (+) Edentulous Upper, Edentulous Lower   Pulmonary former smoker   breath sounds clear to auscultation       Cardiovascular hypertension, pulmonary hypertension+CHF  + dysrhythmias (Not on AC 2/2 Cerebral AVM) Atrial Fibrillation + pacemaker (2/2 Complete Heart Block)  Rhythm:Irregular Rate:Normal     Neuro/Psych Seizures -, Poorly Controlled,  Cerebral aneurysm with midline shift (05/17/24)    GI/Hepatic hiatal hernia,neg GERD  ,,  Endo/Other  neg diabetes    Renal/GU Renal InsufficiencyRenal disease     Musculoskeletal L Femur Fracture 2/2 Fall   Abdominal   Peds  Hematology  (+) Blood dyscrasia, anemia Hgb 8.3, Plts 143K (05/18/24); Type and Screen active    Anesthesia Other Findings   Reproductive/Obstetrics                              Anesthesia Physical Anesthesia Plan  ASA: 4  Anesthesia Plan: General   Post-op Pain Management:    Induction: Intravenous  PONV Risk Score and Plan: 3 and Ondansetron , Dexamethasone , Treatment may vary due to age or medical condition and Propofol  infusion  Airway Management Planned: Oral ETT  Additional Equipment: Arterial line  Intra-op Plan:   Post-operative Plan: Extubation in OR  Informed Consent:     Suspend DNR.   Dental advisory given  Plan Discussed with: CRNA  Anesthesia Plan Comments:          Anesthesia Quick Evaluation

## 2024-05-18 NOTE — Anesthesia Procedure Notes (Signed)
 Procedure Name: Intubation Date/Time: 05/18/2024 3:33 PM  Performed by: Dasie Nena PARAS, CRNAPre-anesthesia Checklist: Patient identified, Emergency Drugs available, Suction available, Patient being monitored and Timeout performed Patient Re-evaluated:Patient Re-evaluated prior to induction Oxygen  Delivery Method: Circle system utilized Preoxygenation: Pre-oxygenation with 100% oxygen  Induction Type: IV induction Ventilation: Two handed mask ventilation required Laryngoscope Size: Miller and 3 Grade View: Grade I Tube type: Oral Tube size: 7.0 mm Number of attempts: 1 Airway Equipment and Method: Stylet Placement Confirmation: ETT inserted through vocal cords under direct vision, positive ETCO2 and breath sounds checked- equal and bilateral Secured at: 23 cm Tube secured with: Tape Dental Injury: Teeth and Oropharynx as per pre-operative assessment

## 2024-05-18 NOTE — Anesthesia Procedure Notes (Signed)
 Arterial Line Insertion Start/End12/01/2024 1:15 PM, 05/18/2024 1:15 PM Performed by: Dene Lauraine DASEN, MD, anesthesiologist  Patient location: Pre-op. Preanesthetic checklist: patient identified, IV checked, site marked, risks and benefits discussed, surgical consent, monitors and equipment checked, pre-op evaluation, timeout performed and anesthesia consent Lidocaine  1% used for infiltration Left, radial was placed Catheter size: 20 G Hand hygiene performed  and maximum sterile barriers used   Attempts: 1 Procedure performed using ultrasound to evaluate access site. Ultrasound Notes:relevant anatomy identified, ultrasound used to visualize needle entry and vessel patent under ultrasound. Following insertion, dressing applied. Post procedure assessment: normal and unchanged  Patient tolerated the procedure well with no immediate complications.

## 2024-05-18 NOTE — Op Note (Signed)
 Date of Surgery: 05/18/2024  INDICATIONS: [name] is a 88 y.o.-year-old female with left hip intertrochanteric fracture.  The risk and benefits of the procedure were discussed in detail and documented in the pre-operative evaluation.   PREOPERATIVE DIAGNOSIS: 1. Left hip intertrochanteric hip fracture  POSTOPERATIVE DIAGNOSIS: Same.  PROCEDURE: 1. Left hip cephulomedullary nailing  SURGEON: Elspeth LITTIE Parker MD  ASSISTANT: Conley Dawson, ATC  ANESTHESIA:  general  IV FLUIDS AND URINE: See anesthesia record.  ANTIBIOTICS: Ancef   ESTIMATED BLOOD LOSS: 25 mL.  IMPLANTS:  Implant Name Type Inv. Item Serial No. Manufacturer Lot No. LRB No. Used Action  NAIL HIP AFFIXUS [id number] [id number] - [id number] Nail NAIL HIP AFFIXUS [id number] [id number]  [employer](ORTH,TRAU,BIO,SG) [id number] Left 1 Implanted  SCREW LAG HIP FRA NAIL 10.5X90 - [id number] Orthopedic Implant SCREW LAG HIP FRA NAIL 10.5X90  [employer](ORTH,TRAU,BIO,SG) [id number] Left 1 Implanted  SCREW BONE CORTICAL 5.0X44 - [id number] Screw SCREW BONE CORTICAL 5.0X44  [employer](ORTH,TRAU,BIO,SG) [id number] Left 1 Implanted    DRAINS: None  CULTURES: None  COMPLICATIONS: none  DESCRIPTION OF PROCEDURE:   The patient's chart/medical history was reviewed and decision was made to administer peri-operative trans-exemic acid.  I have reviewed the patient's history and given the presence of a fragility fracture, I have deemed the necessity of a osteoporosis management referral or confirmed that the patient is currently enrolled in a osteoporosis treatment program.   The patient was identified in the preoperative holding area and the correct site was marked according universal protocol with nursing, and was subsequently taken back to the operating room.  Anesthesia was induced.  Antibiotics were given 1 hour prior to skin incision.  The patient was placed on the Hana table.  The traction post was placed.  Gross traction and internal rotation was  able to provide the best reduction.  The patient was subsequently prepped and draped in the usual sterile fashion.   Final timeout was performed.  A posterolateral approach to the greater trochanter was used.  This was done 3 cm proximal to the greater trochanter.  10 blade was used to incise through skin and IT band.  Blunt dissection was performed down to the level of the greater trochanter.  The pin was placed under direct fluoroscopic visualization at the center of the greater trochanter at the tip.  This was malleted into place down to the level of the lesser trochanter.  Opening reamer was then used.  A ball-tipped guidewire was then place to distal metaphyseal bone in the femur.  This was passed across the fracture site.  The ball wire was then measured and the appropriate size nail size long nail in 11mm was taken.  Size 12.5 reamer was used over the guidewire.  Nail was introduced.  The cephalomedullary wire was placed.  Again measurement was taken after confirming center center on the AP lateral fluoroscopy.  The appropriate size screw was then placed into the neck component of the femur.  The screw was tightened and then backed off one quarter term to allow for compression.  Distal interlocks were then placed.  This was done during perfect circle technique.  15 blade was used to incise through skin and IT band.  Drill was then used bicortically.  Screw sizes were measured and then 2 screws were placed both in static fashion.  The jig was removed.  The wounds were thoroughly irrigated.  Final fluoroscopy was confirmed good reduction on AP and laterals.  Wounds were closed in layers of  0 Vicryl 2-0 Vicryl and staples.  An Aquacel dressing was placed. All counts were correct at the end of the case. The patient was awoken and taken to the PACU without complication.      POSTOPERATIVE PLAN: She will be weight bearing as tolerated left leg. She will be seen by PT while admitted. She will be placed on  Aspirin  for DVT prophylaxis.  Elspeth LITTIE Parker, MD 4:36 PM

## 2024-05-18 NOTE — TOC Initial Note (Addendum)
 Transition of Care [facility]) - Initial/Assessment Note    Patient Details  Name: [name] MRN: [id number] Date of Birth: November 26, 1935  [facility]) CM/SW Contact:    [name], RN Phone Number: 05/18/2024, 12:33 PM  Clinical Narrative:   Admitted from [facility], has PCP and insurance on file. Plan for OR today. INPT CM for SNF  for SNF Placement. INPT CM will follow for dc planning, await PT consult post operative.                Expected Discharge Plan: Skilled Nursing Facility Barriers to Discharge: Continued Medical Work up   Patient Goals and CMS Choice Patient states their goals for this hospitalization and ongoing recovery are:: return to [facility]          Expected Discharge Plan and Services       Living arrangements for the past 2 months: Assisted Living Facility [facility])                                      Prior Living Arrangements/Services Living arrangements for the past 2 months: Assisted Living Facility [facility]) Lives with:: Self                   Activities of Daily Living   ADL Screening (condition at time of admission) Independently performs ADLs?: No Does the patient have a NEW difficulty with bathing/dressing/toileting/self-feeding that is expected to last >3 days?: No Does the patient have a NEW difficulty with getting in/out of bed, walking, or climbing stairs that is expected to last >3 days?: No Does the patient have a NEW difficulty with communication that is expected to last >3 days?: No Is the patient deaf or have difficulty hearing?: No Does the patient have difficulty seeing, even when wearing glasses/contacts?: No Does the patient have difficulty concentrating, remembering, or making decisions?: No  Permission Sought/Granted                  Emotional Assessment              Admission diagnosis:  Hip fracture (HCC) [S72.009A] Closed fracture of right hip, initial  encounter (HCC) [S72.001A] Patient Active Problem List   Diagnosis Date Noted   Chronic kidney disease, stage 3b (HCC) 05/18/2024   DNR (do not resuscitate) 05/18/2024   Hip fracture (HCC) 05/17/2024   Presence of heart assist device (HCC) 03/16/2024   Acute respiratory failure with hypoxia (HCC) 03/16/2024   Congestive heart failure (CHF) (HCC) 02/24/2024   Cholecystitis with cholelithiasis 04/24/2022   Hyperglycemia 04/22/2022   Neuropathy 04/05/2022   Pericardial effusion without cardiac tamponade 01/28/2022   Acute pericarditis    Pacemaker    Heart block AV complete (HCC)    Paroxysmal atrial fibrillation (HCC)    Aortic atherosclerosis    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   Syncope 10/15/2021   Hypertension    Cerebral AV malformation    Dyslipidemia    Iron  deficiency anemia 05/24/2021   Macrocytic anemia 05/17/2021   Pressure injury of skin 07/22/2020   Hyponatremia 07/21/2020   Mixed hyperlipidemia 07/21/2020   History of complex partial epilepsy 07/21/2020   Hypomagnesemia 07/21/2020   Diarrhea 07/21/2020   Partial symptomatic epilepsy with complex partial seizures, not intractable, without status epilepticus (HCC) 07/28/2014   Spinal stenosis of lumbar region 07/28/2014   Abnormality of gait 11/24/2013   Low  back pain 11/24/2013   Essential hypertension    Hypercholesterolemia    Carotid arterial disease    MVP (mitral valve prolapse)    Abnormal cardiovascular stress test    PCP:  [name], MD Pharmacy:   [facility] - [place], [place] - [phone] [address] [place] [address] Phone: [phone] Fax: [phone]     Social Drivers of Health (SDOH) Social History: SDOH Screenings   Food Insecurity: No Food Insecurity (05/17/2024)  Housing: Low Risk  (05/17/2024)  Transportation Needs: No Transportation Needs (05/17/2024)  Utilities: Not At Risk (05/17/2024)  Alcohol  Screen: Low Risk  (08/05/2023)   Depression (PHQ2-9): Low Risk  (08/13/2023)  Social Connections: Moderately Integrated (05/17/2024)  Tobacco Use: Medium Risk (05/18/2024)   SDOH Interventions:     Readmission Risk Interventions    05/18/2024   10:14 AM  Readmission Risk Prevention Plan  Transportation Screening Complete  PCP or Specialist Appt within 5-7 Days Complete  Home Care Screening Complete  Medication Review (RN CM) Complete

## 2024-05-18 NOTE — Anesthesia Postprocedure Evaluation (Signed)
 Anesthesia Post Note  Patient: [name]  Procedure(s) Performed: INTERTROCHANTERIC FRACTURE FIXATION WITH INTRAMEDULLARY ROD (Left)     Patient location during evaluation: PACU Anesthesia Type: General Level of consciousness: awake and alert Pain management: pain level controlled Vital Signs Assessment: post-procedure vital signs reviewed and stable Respiratory status: spontaneous breathing, nonlabored ventilation, respiratory function stable and patient connected to nasal cannula oxygen  Cardiovascular status: blood pressure returned to baseline and stable Postop Assessment: no apparent nausea or vomiting Anesthetic complications: no   No notable events documented.  Last Vitals:  Vitals:   05/18/24 1745 05/18/24 1843  BP: (!) 172/41 (!) 168/43  Pulse: 60 61  Resp: 18 16  Temp:  36.7 C  SpO2: 97% 99%    Last Pain:  Vitals:   05/18/24 1843  TempSrc: Oral  PainSc:                  [name]

## 2024-05-18 NOTE — Assessment & Plan Note (Signed)
-  Appears to be stable at this time -Attempt to avoid nephrotoxic medications -Recheck BMP in AM  

## 2024-05-19 ENCOUNTER — Encounter (HOSPITAL_COMMUNITY): Payer: Self-pay | Admitting: Orthopaedic Surgery

## 2024-05-19 DIAGNOSIS — D62 Acute posthemorrhagic anemia: Secondary | ICD-10-CM | POA: Insufficient documentation

## 2024-05-19 DIAGNOSIS — R338 Other retention of urine: Secondary | ICD-10-CM | POA: Insufficient documentation

## 2024-05-19 LAB — CBC
HCT: 24.1 % — ABNORMAL LOW (ref 36.0–46.0)
Hemoglobin: 7.8 g/dL — ABNORMAL LOW (ref 12.0–15.0)
MCH: 33.9 pg (ref 26.0–34.0)
MCHC: 32.4 g/dL (ref 30.0–36.0)
MCV: 104.8 fL — ABNORMAL HIGH (ref 80.0–100.0)
Platelets: 130 K/uL — ABNORMAL LOW (ref 150–400)
RBC: 2.3 MIL/uL — ABNORMAL LOW (ref 3.87–5.11)
RDW: 13.6 % (ref 11.5–15.5)
WBC: 9.6 K/uL (ref 4.0–10.5)
nRBC: 0 % (ref 0.0–0.2)

## 2024-05-19 LAB — BASIC METABOLIC PANEL WITH GFR
Anion gap: 9 (ref 5–15)
BUN: 45 mg/dL — ABNORMAL HIGH (ref 8–23)
CO2: 24 mmol/L (ref 22–32)
Calcium: 9.1 mg/dL (ref 8.9–10.3)
Chloride: 105 mmol/L (ref 98–111)
Creatinine, Ser: 1.43 mg/dL — ABNORMAL HIGH (ref 0.44–1.00)
GFR, Estimated: 36 mL/min — ABNORMAL LOW (ref >60)
Glucose, Bld: 112 mg/dL — ABNORMAL HIGH (ref 70–99)
Potassium: 4.4 mmol/L (ref 3.5–5.1)
Sodium: 138 mmol/L (ref 135–145)

## 2024-05-19 LAB — PREPARE RBC (CROSSMATCH)

## 2024-05-19 MED ORDER — TAMSULOSIN HCL 0.4 MG PO CAPS
0.4000 mg | ORAL_CAPSULE | Freq: Every day | ORAL | Status: DC
  Administered 2024-05-19: 0.4 mg via ORAL
  Filled 2024-05-19: qty 1

## 2024-05-19 MED ORDER — MORPHINE SULFATE (PF) 2 MG/ML IV SOLN: 2.0000 mg | INTRAVENOUS | Status: DC | PRN

## 2024-05-19 MED ORDER — METHOCARBAMOL 500 MG PO TABS
500.0000 mg | ORAL_TABLET | Freq: Four times a day (QID) | ORAL | Status: DC | PRN
  Administered 2024-05-19: 500 mg via ORAL
  Filled 2024-05-19: qty 1

## 2024-05-19 MED ORDER — SODIUM CHLORIDE 0.9% IV SOLUTION: Freq: Once | INTRAVENOUS | Status: AC

## 2024-05-19 MED ORDER — OXYCODONE HCL 5 MG PO TABS
5.0000 mg | ORAL_TABLET | ORAL | Status: DC | PRN
  Administered 2024-05-19: 5 mg via ORAL
  Filled 2024-05-19 (×2): qty 1

## 2024-05-19 MED ORDER — ORAL CARE MOUTH RINSE: 15.0000 mL | OROMUCOSAL | Status: DC | PRN

## 2024-05-19 MED ORDER — METHOCARBAMOL 1000 MG/10ML IJ SOLN: 500.0000 mg | Freq: Four times a day (QID) | INTRAMUSCULAR | Status: DC | PRN

## 2024-05-19 NOTE — Assessment & Plan Note (Signed)
 Baseline chronic anemia, Hgb 8.9 on presentation Post-operative Hgb 7.8 Will transfuse 1 unit PRBC per ortho recommendation Patient/daughter counseled and agree

## 2024-05-19 NOTE — Assessment & Plan Note (Signed)
 STAT echo requested by anesthesia related to prior h/o pericardial effusion and pulmonary HTN Echo 12/8 with presented ED and grade 3 diastolic CHF Moderate pulmonary HTN, small pericardial effusion present Appears compensated

## 2024-05-19 NOTE — Progress Notes (Signed)
 PT Cancellation Note  Patient Details Name: [name] MRN: [id number] DOB: 04-24-1936   Cancelled Treatment:    Reason Eval/Treat Not Completed: Patient not medically ready  PT order received, now orders for blood transfusion. PT will check back another time post  infusion. Darice Potters PT Acute Rehabilitation Services Office [phone]  [name] 05/19/2024, 12:32 PM

## 2024-05-19 NOTE — Assessment & Plan Note (Signed)
 Patient and daughter reported no UOP today Bladder scan with >900cc I/O cath x 1 Repeat bladder scan after 4-6 hours; if ongoing retention, will place foley

## 2024-05-19 NOTE — [place] FL2 (Signed)
 [place]  MEDICAID FL2 LEVEL OF CARE FORM     IDENTIFICATION  Patient Name: [name] Birthdate: June 25, 1935 Sex: female Admission Date (Current Location): 05/17/2024  [facility] and [place] Number:  Producer, Television/film/video and Address:  [facility],  [address] [place], [place] [address]      Provider Number: [id number]  Attending Physician Name and Address:  Barbarann Nest, MD  Relative Name and Phone Number:  Levorn Kung (Daughter)  [phone] [facility])    Current Level of Care: Hospital Recommended Level of Care: Skilled Nursing Facility ([facility]( [facility] Prior Approval Number:    Date Approved/Denied:   PASRR Number:    Discharge Plan: SNF ([facility] ( [facility]))    Current Diagnoses: Patient Active Problem List   Diagnosis Date Noted   Chronic kidney disease, stage 3b (HCC) 05/18/2024   DNR (do not resuscitate) 05/18/2024   Closed displaced intertrochanteric fracture of left femur (HCC) 05/18/2024   Presence of heart assist device (HCC) 03/16/2024   Acute respiratory failure with hypoxia (HCC) 03/16/2024   Chronic diastolic CHF (congestive heart failure) (HCC) 02/24/2024   Cholecystitis with cholelithiasis 04/24/2022   Hyperglycemia 04/22/2022   Neuropathy 04/05/2022   Pericardial effusion without cardiac tamponade 01/28/2022   Acute pericarditis    Pacemaker    Heart block AV complete (HCC)    Paroxysmal atrial fibrillation (HCC)    Aortic atherosclerosis    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   Syncope 10/15/2021   Hypertension    Cerebral AV malformation    Dyslipidemia    Iron  deficiency anemia 05/24/2021   Macrocytic anemia 05/17/2021   Pressure injury of skin 07/22/2020   Hyponatremia 07/21/2020   Mixed hyperlipidemia 07/21/2020   History of complex partial epilepsy 07/21/2020   Hypomagnesemia 07/21/2020   Diarrhea 07/21/2020   Partial symptomatic epilepsy with complex partial seizures, not  intractable, without status epilepticus (HCC) 07/28/2014   Spinal stenosis of lumbar region 07/28/2014   Abnormality of gait 11/24/2013   Low back pain 11/24/2013   Essential hypertension    Hypercholesterolemia    Carotid arterial disease    MVP (mitral valve prolapse)    Abnormal cardiovascular stress test     Orientation RESPIRATION BLADDER Height & Weight     Self, Situation  Normal Continent, External catheter Weight: 55 kg Height:  5' 5 (165.1 cm)  BEHAVIORAL SYMPTOMS/MOOD NEUROLOGICAL BOWEL NUTRITION STATUS      Continent Diet (regular)  AMBULATORY STATUS COMMUNICATION OF NEEDS Skin   Limited Assist Verbally Other (Comment) (Erythema bilateral buttocks and coccyx)                       Personal Care Assistance Level of Assistance  Bathing, Feeding, Dressing Bathing Assistance: Limited assistance Feeding assistance: Limited assistance Dressing Assistance: Limited assistance     Functional Limitations Info  Sight, Hearing, Speech Sight Info: Adequate Hearing Info: Adequate Speech Info: Adequate    SPECIAL CARE FACTORS FREQUENCY  PT (By licensed PT), OT (By licensed OT)     PT Frequency: 5x/wk OT Frequency: 5x/wk            Contractures Contractures Info: Not present    Additional Factors Info  Code Status, Allergies, Psychotropic Code Status Info: DNR Allergies Info: Aleve (Naproxen), Azithromycin, Penicillins, Pravastatin, Sulfa Antibiotics, Sulfa Drugs Cross Reactors, Tape, Naproxen Sodium Psychotropic Info: N/A         Current Medications (05/19/2024):  This is the current hospital active medication list  Current Facility-Administered Medications  Medication Dose Route Frequency Provider Last Rate Last Admin   acetaminophen  (TYLENOL ) tablet 650 mg  650 mg Oral Q6H PRN Genelle Standing, MD   650 mg at 05/19/24 0533   Or   acetaminophen  (TYLENOL ) suppository 650 mg  650 mg Rectal Q6H PRN Genelle Standing, MD       amLODipine  (NORVASC ) tablet 5 mg   5 mg Oral Daily Genelle Standing, MD   5 mg at 05/19/24 0941   artificial tears ophthalmic solution 1 drop  1 drop Both Eyes BID Barbarann Nest, MD   1 drop at 05/19/24 0944   aspirin  tablet 325 mg  325 mg Oral Daily Genelle Standing, MD   325 mg at 05/19/24 0941   bisacodyl  (DULCOLAX) EC tablet 5 mg  5 mg Oral Daily PRN Genelle Standing, MD       Chlorhexidine  Gluconate Cloth 2 % PADS 6 each  6 each Topical Daily Genelle Standing, MD   6 each at 05/19/24 1121   docusate sodium  (COLACE) capsule 100 mg  100 mg Oral BID Bokshan, Steven, MD   100 mg at 05/19/24 0941   fluticasone  (FLONASE ) 50 MCG/ACT nasal spray 1-2 spray  1-2 spray Each Nare Daily Barbarann Nest, MD   1 spray at 05/19/24 0944   HYDROmorphone  (DILAUDID ) injection 0.25 mg  0.25 mg Intravenous Q3H PRN Genelle Standing, MD   0.25 mg at 05/19/24 1107   irbesartan  (AVAPRO ) tablet 150 mg  150 mg Oral Daily Barbarann Nest, MD   150 mg at 05/19/24 0941   levETIRAcetam  (KEPPRA ) undiluted injection 500 mg  500 mg Intravenous BID Genelle Standing, MD   500 mg at 05/18/24 0940   Or   levETIRAcetam  (KEPPRA ) tablet 500 mg  500 mg Oral BID Genelle Standing, MD   500 mg at 05/19/24 0941   methocarbamol  (ROBAXIN ) tablet 500 mg  500 mg Oral Q6H PRN Britta Eva HERO, [facility]       mupirocin  ointment (BACTROBAN ) 2 % 1 Application  1 Application Nasal BID Genelle Standing, MD   1 Application at 05/19/24 0944   ondansetron  (ZOFRAN ) tablet 4 mg  4 mg Oral Q6H PRN Genelle Standing, MD       Or   ondansetron  (ZOFRAN ) injection 4 mg  4 mg Intravenous Q6H PRN Genelle Standing, MD       PHENobarbital  (LUMINAL) tablet 64.8 mg  64.8 mg Oral QHS Genelle Standing, MD   64.8 mg at 05/18/24 2148   phenytoin  (DILANTIN ) ER capsule 200 mg  200 mg Oral Daily Bokshan, Steven, MD   200 mg at 05/19/24 0941   sodium chloride  flush (NS) 0.9 % injection 3 mL  3 mL Intravenous Q12H Genelle Standing, MD   3 mL at 05/19/24 9056     Discharge Medications: Please see discharge summary for  a list of discharge medications.  Relevant Imaging Results:  Relevant Lab Results:   Additional Information SSN: [id number]  Kelsey JONELLE Rex, RN

## 2024-05-19 NOTE — Assessment & Plan Note (Signed)
 Discussed with Dr. Maude Naegeli of neurovascular intervention Neurology consulted about perioperative seizure risk management; recommend continuing her baseline Dilantin  and nightly phenobarbital  but adding Keppra  perioperatively to hopefully prevent the postop seizures that she had after her last surgery Will add Keppra  for a week and then taper off No neurovascular intervention is needed at this time - the vasogenic edema is not significantly changed from previous scans

## 2024-05-19 NOTE — Assessment & Plan Note (Signed)
 Rate controlled with medication Pacer in place Not an [facility] candidate

## 2024-05-19 NOTE — Discharge Instructions (Signed)
     Discharge Instructions    Attending Surgeon: Elspeth Parker, MD Office Phone Number: [phone]   Diagnosis and Procedures:    Surgeries Performed: Left hip nailing  Discharge Plan:    Diet: Resume usual diet. Begin with light or bland foods.  Drink plenty of fluids.  Activity:  Weight bearing as tolerated on left leg  GENERAL INSTRUCTIONS: 1.  Please apply ice to your wound to help with swelling and inflammation. This will improve your comfort and your overall recovery following surgery.     2. Please call Dr. Danetta office at [phone] with questions Monday-Friday during business hours. If no one answers, please leave a message and someone should get back to the patient within 24 hours. For emergencies please call 911 or proceed to the emergency room.   3. Patient to notify surgical team if experiences any of the following: Bowel/Bladder dysfunction, uncontrolled pain, nerve/muscle weakness, incision with increased drainage or redness, nausea/vomiting and Fever greater than 101.0 F.  Be alert for signs of infection including redness, streaking, odor, fever or chills. Be alert for excessive pain or bleeding and notify your surgeon immediately.  WOUND INSTRUCTIONS:   Leave your dressing, cast, or splint in place until your post operative visit.  Keep it clean and dry.  Always keep the incision clean and dry until the staples/sutures are removed. If there is no drainage from the incision you should keep it open to air. If there is drainage from the incision you must keep it covered at all times until the drainage stops  Do not soak in a bath tub, hot tub, pool, lake or other body of water  until 21 days after your surgery and your incision is completely dry and healed.  If you have removable sutures (or staples) they must be removed 10-14 days (unless otherwise instructed) from the day of your surgery.     1)  Elevate the extremity as much as possible.  2)  Keep  the dressing clean and dry.  3)  Please call us  if the dressing becomes wet or dirty.  4)  If you are experiencing worsening pain or worsening swelling, please call.     MEDICATIONS: Resume all previous home medications at the previous prescribed dose and frequency unless otherwise noted Start taking the  pain medications on an as-needed basis as prescribed  Please taper down pain medication over the next week following surgery.  Ideally you should not require a refill of any narcotic pain medication.  Take pain medication with food to minimize nausea. In addition to the prescribed pain medication, you may take over-the-counter pain relievers such as Tylenol .  Do NOT take additional tylenol  if your pain medication already has tylenol  in it.  Aspirin  325mg  daily per instructions on bottle. Narcotic policy: Per [facility] clinic policy, our goal is ensure optimal postoperative pain control with a multimodal pain management strategy. For all [facility] patients, our goal is to wean post-operative narcotic medications by 6 weeks post-operatively, and many times sooner. If this is not possible due to utilization of pain medication prior to surgery, your [facility] doctor will support your acute post-operative pain control for the first 6 weeks postoperatively, with a plan to transition you back to your primary pain team following that. Maralee will work to ensure a therapist, occupational.

## 2024-05-19 NOTE — Assessment & Plan Note (Signed)
 Amlodipine  continued on presentation Elevated BP today so irbesartan  (valsartan  per formulary) is resumed She has prn Hydralazine  on her home med list, not ordered

## 2024-05-19 NOTE — Progress Notes (Signed)
   Subjective:  Patient reports pain as mild.Tolerating diet. Received 1 unit pRBCS  Objective:   VITALS:   Vitals:   05/19/24 0626 05/19/24 0941 05/19/24 1337 05/19/24 1407  BP: (!) 154/55 125/69 (!) 119/34 (!) 117/35  Pulse: 63 (!) 59 60 61  Resp: 18  16 16   Temp: 97.9 F (36.6 C) (!) 97.4 F (36.3 C) 97.7 F (36.5 C) 97.7 F (36.5 C)  TempSrc: Oral Oral Oral   SpO2: 96% 98% 96% 97%  Weight:      Height:        Left hip dressing CDI, fires EHL/TA/GS with 2+ DP pusle on left Lab Results  Component Value Date   WBC 9.6 05/19/2024   HGB 7.8 (L) 05/19/2024   HCT 24.1 (L) 05/19/2024   MCV 104.8 (H) 05/19/2024   PLT 130 (L) 05/19/2024     Assessment/Plan:  1 Day Post-Op status post left hip cephulomedullary nailing, doing well  - Expected postop acute blood loss anemia - transfused 1 unit pRBCS - Patient to work with PT/OT to optimize mobilization safely - DVT ppx - SCDs, ambulation, Aspirin  325 mg daily - WBAT operative extremity - Pain control - multimodal pain management, ATC acetaminophen  in conjunction with as needed narcotic (oxycodone ), although this should be minimized with other modalities  - Discharge planning pending CM, appreciate coordination    Murphy Bundick 05/19/2024, 2:56 PM

## 2024-05-19 NOTE — Progress Notes (Signed)
 Progress Note   Patient: [name] FMW:[id number] DOB: 13-Jun-1935 DOA: 05/17/2024     2 DOS: the patient was seen and examined on 05/19/2024   Brief hospital course: 88yo with h/o stage 3b CKD, afib on on AC due to cerebral AVM, pacemaker, CAD, MCI, and chronic HFpEF who presented on 12/7 with a mechanical fall.  She has a left intertrochanteric femur fracture.  Head CT with known AVM with surrounding vasogenic edema causing some mass effect on the anterior horn of the L ventricle; neurovascular interventionalist did not think acute intervention was needed.  Orthopedics performed ORIF 12/8.   Assessment & Plan Closed displaced intertrochanteric fracture of left femur (HCC) L intertrochanteric femur fracture  Dr. Gonzella did ORIF on 12/8 Pain control with Tylenol , Robaxin , Oxycodone , and Morphine  prn TOC team consult for rehab placement - she lives at [facility] and is planned to go back on 12/10 PT consulted post-operatively - although [facility] does not require this prior to return Hip fracture order set utilized TXA per orthopedics Cerebral AV malformation Partial symptomatic epilepsy with complex partial seizures, not intractable, without status epilepticus (HCC) Discussed with Dr. Maude Naegeli of neurovascular intervention Neurology consulted about perioperative seizure risk management; recommend continuing her baseline Dilantin  and nightly phenobarbital  but adding Keppra  perioperatively to hopefully prevent the postop seizures that she had after her last surgery Will add Keppra  for a week and then taper off No neurovascular intervention is needed at this time - the vasogenic edema is not significantly changed from previous scans ABLA (acute blood loss anemia) Baseline chronic anemia, Hgb 8.9 on presentation Post-operative Hgb 7.8 Will transfuse 1 unit PRBC per ortho recommendation Patient/daughter counseled and agree Acute urinary retention Patient and daughter reported  no UOP today Bladder scan with >900cc I/O cath x 1 Repeat bladder scan after 4-6 hours; if ongoing retention, will place foley Chronic diastolic CHF (congestive heart failure) (HCC) MVP (mitral valve prolapse) STAT echo requested by anesthesia related to prior h/o pericardial effusion and pulmonary HTN Echo 12/8 with presented ED and grade 3 diastolic CHF Moderate pulmonary HTN, small pericardial effusion present Appears compensated Paroxysmal atrial fibrillation (HCC) Pacemaker Rate controlled with medication Pacer in place Not an AC candidate Pressure injury of skin Wound 05/17/24 1948 Pressure Injury Coccyx Right;Left;Medial Stage 1 -  Intact skin with non-blanchable redness of a localized area usually over a bony prominence. (Active)  POA Hypertension Amlodipine  continued on presentation Elevated BP today so irbesartan  (valsartan  per formulary) is resumed She has prn Hydralazine  on her home med list, not ordered Chronic kidney disease, stage 3b (HCC) Appears to be stable at this time Attempt to avoid nephrotoxic medications DNR (do not resuscitate) DNR confirmed at the time of admission [facility] documents reviewed Patient has a gold out of facility DNR form      Consultants: Orthopedics Neurosurgery by telephone only   Procedures: ORIF 12/8   Antibiotics: None  30 Day Unplanned Readmission Risk Score    Flowsheet Row ED to Hosp-Admission (Current) from 05/17/2024 in [place] [facility]  30 Day Unplanned Readmission Risk Score (%) 17.37 Filed at 05/19/2024 0400    This score is the patient's risk of an unplanned readmission within 30 days of being discharged (0 -100%). The score is based on dignosis, age, lab data, medications, orders, and past utilization.   Low:  0-14.9   Medium: 15-21.9   High: 22-29.9   Extreme: 30 and above  Subjective: Feeling ok, good pain control.  Has not voided today.   Objective: Vitals:   05/19/24 1337  05/19/24 1407  BP: (!) 119/34 (!) 117/35  Pulse: 60 61  Resp: 16 16  Temp: 97.7 F (36.5 C) 97.7 F (36.5 C)  SpO2: 96% 97%    Intake/Output Summary (Last 24 hours) at 05/19/2024 1517 Last data filed at 05/19/2024 1257 Gross per 24 hour  Intake 1724.43 ml  Output 2675 ml  Net -950.57 ml   Filed Weights   05/17/24 1125 05/18/24 1236  Weight: 55.3 kg 55 kg    Exam:  General:  Appears calm and comfortable and is in NAD Eyes:  normal lids, iris ENT:  grossly normal hearing, lips & tongue, mmm Cardiovascular:  RRR. No LE edema.  Respiratory:   CTA bilaterally with no wheezes/rales/rhonchi.  Normal respiratory effort. Abdomen:  soft, NT, ND Skin:  no rash or induration seen on limited exam Musculoskeletal:  able to wiggle toes, no reported pain at rest Psychiatric:  grossly normal mood and affect, speech fluent and appropriate, AOx3 Neurologic:  CN 2-12 grossly intact, moves all extremities in coordinated fashion  Data Reviewed: I have reviewed the patient's lab results since admission.  Pertinent labs for today include:   Stable BMP WBC 9.6 Hgb 7.8 Platelets 130     Family Communication: Daughter was present  Mobility: PT/OT Consulted      Code Status: Do not attempt resuscitation (DNR) PRE-ARREST INTERVENTIONS DESIRED   Disposition: Status is: Inpatient Remains inpatient appropriate because: ongoing management     Time spent: 50 minutes  Unresulted Labs (From admission, onward)     Start     Ordered   05/20/24 0500  CBC  Tomorrow morning,   R        05/19/24 0703   05/20/24 0500  Basic metabolic panel with GFR  Tomorrow morning,   R        05/19/24 0703             Author: Delon Herald, MD 05/19/2024 3:17 PM  For on call review [email or web address].

## 2024-05-19 NOTE — Plan of Care (Signed)
  Problem: Coping: Goal: Level of anxiety will decrease Outcome: Progressing   Problem: Elimination: Goal: Will not experience complications related to urinary retention Outcome: Progressing   Problem: Pain Managment: Goal: General experience of comfort will improve and/or be controlled Outcome: Progressing   Problem: Safety: Goal: Ability to remain free from injury will improve Outcome: Progressing

## 2024-05-19 NOTE — Assessment & Plan Note (Signed)
 DNR confirmed at the time of admission [facility] documents reviewed Patient has a gold out of facility DNR form

## 2024-05-19 NOTE — Progress Notes (Signed)
 PT Cancellation Note  Patient Details Name: [name] MRN: [id number] DOB: March 30, 1936   Cancelled Treatment:    Reason Eval/Treat Not Completed: Medical issues which prohibited therapy Getting I Unit og blood. Will eval  12/10. Probably DC to SNF.  Darice Potters PT Acute Rehabilitation Services Office [phone]   [name] 05/19/2024, 4:44 PM

## 2024-05-19 NOTE — Assessment & Plan Note (Addendum)
 L intertrochanteric femur fracture  Dr. Gonzella did ORIF on 12/8 Pain control with Tylenol , Robaxin , Oxycodone , and Morphine  prn TOC team consult for rehab placement - she lives at [facility] and is planned to go back on 12/10 PT consulted post-operatively - although [facility] does not require this prior to return Hip fracture order set utilized TXA per orthopedics

## 2024-05-19 NOTE — TOC Progression Note (Addendum)
 Transition of Care [facility]) - Progression Note    Patient Details  Name: [name] MRN: [id number] Date of Birth: 19-Jun-1935  [facility] CM/SW Contact  [name], RN Phone Number: 05/19/2024, 1:47 PM  Clinical Narrative:   Call to [facility], admissions coordinator at [facility], confirmed patient is ALF resident with plan to return to their rehab unit, will need FL2 and DC Summary at discharge. Plan for admission tomorrow, team notified.     Expected Discharge Plan: Skilled Nursing Facility Barriers to Discharge: Continued Medical Work up               Expected Discharge Plan and Services       Living arrangements for the past 2 months: Assisted Living Facility [employer])                                       Social Drivers of Health (SDOH) Interventions SDOH Screenings   Food Insecurity: No Food Insecurity (05/17/2024)  Housing: Low Risk  (05/17/2024)  Transportation Needs: No Transportation Needs (05/17/2024)  Utilities: Not At Risk (05/17/2024)  Alcohol  Screen: Low Risk  (08/05/2023)  Depression (PHQ2-9): Low Risk  (08/13/2023)  Social Connections: Moderately Integrated (05/17/2024)  Tobacco Use: Medium Risk (05/18/2024)    Readmission Risk Interventions    05/18/2024   10:14 AM  Readmission Risk Prevention Plan  Transportation Screening Complete  PCP or Specialist Appt within 5-7 Days Complete  Home Care Screening Complete  Medication Review (RN CM) Complete

## 2024-05-19 NOTE — Plan of Care (Signed)

## 2024-05-19 NOTE — Assessment & Plan Note (Addendum)
 Wound 05/17/24 1948 Pressure Injury Coccyx Right;Left;Medial Stage 1 -  Intact skin with non-blanchable redness of a localized area usually over a bony prominence. (Active)  POA

## 2024-05-19 NOTE — Assessment & Plan Note (Addendum)
-  Appears to be stable at this time -Attempt to avoid nephrotoxic medications

## 2024-05-20 DIAGNOSIS — S72142A Displaced intertrochanteric fracture of left femur, initial encounter for closed fracture: Secondary | ICD-10-CM | POA: Diagnosis not present

## 2024-05-20 LAB — BASIC METABOLIC PANEL WITH GFR
Anion gap: 9 (ref 5–15)
BUN: 51 mg/dL — ABNORMAL HIGH (ref 8–23)
CO2: 23 mmol/L (ref 22–32)
Calcium: 8.4 mg/dL — ABNORMAL LOW (ref 8.9–10.3)
Chloride: 100 mmol/L (ref 98–111)
Creatinine, Ser: 1.47 mg/dL — ABNORMAL HIGH (ref 0.44–1.00)
GFR, Estimated: 34 mL/min — ABNORMAL LOW (ref >60)
Glucose, Bld: 105 mg/dL — ABNORMAL HIGH (ref 70–99)
Potassium: 4.6 mmol/L (ref 3.5–5.1)
Sodium: 132 mmol/L — ABNORMAL LOW (ref 135–145)

## 2024-05-20 LAB — BPAM RBC
Blood Product Expiration Date: 202512302359
ISSUE DATE / TIME: 202512091343
Unit Type and Rh: 600

## 2024-05-20 LAB — CBC
HCT: 25.2 % — ABNORMAL LOW (ref 36.0–46.0)
Hemoglobin: 8.6 g/dL — ABNORMAL LOW (ref 12.0–15.0)
MCH: 32.8 pg (ref 26.0–34.0)
MCHC: 34.1 g/dL (ref 30.0–36.0)
MCV: 96.2 fL (ref 80.0–100.0)
Platelets: 127 K/uL — ABNORMAL LOW (ref 150–400)
RBC: 2.62 MIL/uL — ABNORMAL LOW (ref 3.87–5.11)
RDW: 16 % — ABNORMAL HIGH (ref 11.5–15.5)
WBC: 7.7 K/uL (ref 4.0–10.5)
nRBC: 0 % (ref 0.0–0.2)

## 2024-05-20 LAB — TYPE AND SCREEN
ABO/RH(D): A NEG
Antibody Screen: NEGATIVE
Unit division: 0

## 2024-05-20 MED ORDER — LEVETIRACETAM 500 MG PO TABS: 500.0000 mg | ORAL_TABLET | Freq: Once | ORAL | Status: DC

## 2024-05-20 MED ORDER — BISACODYL 5 MG PO TBEC: 5.0000 mg | DELAYED_RELEASE_TABLET | Freq: Every day | ORAL | Status: AC | PRN

## 2024-05-20 MED ORDER — ASPIRIN 325 MG PO TABS: 325.0000 mg | ORAL_TABLET | Freq: Every day | ORAL | 0 refills | Status: AC

## 2024-05-20 MED ORDER — METOPROLOL TARTRATE 5 MG/5ML IV SOLN: 5.0000 mg | INTRAVENOUS | Status: DC | PRN

## 2024-05-20 MED ORDER — TAMSULOSIN HCL 0.4 MG PO CAPS: 0.4000 mg | ORAL_CAPSULE | Freq: Every day | ORAL | 0 refills | Status: AC

## 2024-05-20 MED ORDER — LEVETIRACETAM 250 MG PO TABS: 250.0000 mg | ORAL_TABLET | Freq: Every day | ORAL | Status: DC

## 2024-05-20 MED ORDER — GLUCAGON HCL RDNA (DIAGNOSTIC) 1 MG IJ SOLR: 1.0000 mg | INTRAMUSCULAR | Status: DC | PRN

## 2024-05-20 MED ORDER — IPRATROPIUM-ALBUTEROL 0.5-2.5 (3) MG/3ML IN SOLN: 3.0000 mL | RESPIRATORY_TRACT | Status: DC | PRN

## 2024-05-20 MED ORDER — LEVETIRACETAM 250 MG PO TABS: ORAL_TABLET | ORAL | 0 refills | Status: DC

## 2024-05-20 MED ORDER — DOCUSATE SODIUM 100 MG PO CAPS: 100.0000 mg | ORAL_CAPSULE | Freq: Two times a day (BID) | ORAL | 0 refills | Status: AC

## 2024-05-20 MED ORDER — PHENOBARBITAL 64.8 MG PO TABS: 64.8000 mg | ORAL_TABLET | Freq: Every day | ORAL | 5 refills | Status: DC

## 2024-05-20 MED ORDER — HYDRALAZINE HCL 20 MG/ML IJ SOLN: 10.0000 mg | INTRAMUSCULAR | Status: DC | PRN

## 2024-05-20 MED ORDER — OXYCODONE HCL 5 MG PO TABS: 5.0000 mg | ORAL_TABLET | ORAL | 0 refills | Status: DC | PRN

## 2024-05-20 MED ORDER — PHENYTOIN SODIUM EXTENDED 100 MG PO CAPS: 200.0000 mg | ORAL_CAPSULE | Freq: Every day | ORAL | 0 refills | Status: DC

## 2024-05-20 NOTE — Plan of Care (Signed)
  Problem: Education: Goal: Knowledge of General Education information will improve Description: Including pain rating scale, medication(s)/side effects and non-pharmacologic comfort measures Outcome: Progressing   Problem: Clinical Measurements: Goal: Respiratory complications will improve Outcome: Progressing Goal: Cardiovascular complication will be avoided Outcome: Progressing   Problem: Nutrition: Goal: Adequate nutrition will be maintained Outcome: Progressing   Problem: Coping: Goal: Level of anxiety will decrease Outcome: Progressing   Problem: Pain Managment: Goal: General experience of comfort will improve and/or be controlled Outcome: Progressing

## 2024-05-20 NOTE — Progress Notes (Signed)
° °  Subjective:  Patient reports pain as mild.Tolerating diet. Hg stabilized after 1 unit of pRBCs close to baseline  Objective:   VITALS:   Vitals:   05/19/24 1407 05/19/24 1709 05/19/24 2147 05/20/24 0557  BP: (!) 117/35 (!) 129/40 (!) 136/38 (!) 130/43  Pulse: 61 60 62 (!) 59  Resp: 16 16 17 15   Temp: 97.7 F (36.5 C) (!) 97.4 F (36.3 C) 97.6 F (36.4 C) 97.9 F (36.6 C)  TempSrc:  Oral Oral Oral  SpO2: 97% 95% 98% 96%  Weight:      Height:        Left hip dressing CDI, fires EHL/TA/GS with 2+ DP pusle on left Lab Results  Component Value Date   WBC 7.7 05/20/2024   HGB 8.6 (L) 05/20/2024   HCT 25.2 (L) 05/20/2024   MCV 96.2 05/20/2024   PLT 127 (L) 05/20/2024     Assessment/Plan:  2 Days Post-Op status post left hip cephulomedullary nailing, doing well  - Expected postop acute blood loss anemia - transfused 1 unit pRBCS - Patient to work with PT/OT to optimize mobilization safely - DVT ppx - SCDs, ambulation, Aspirin  325 mg daily - WBAT operative extremity - Pain control - multimodal pain management, ATC acetaminophen  in conjunction with as needed narcotic (oxycodone ), although this should be minimized with other modalities  - Discharge planning pending CM, appreciate coordination    [name] 05/20/2024, 7:12 AM

## 2024-05-20 NOTE — Evaluation (Signed)
 Physical Therapy Evaluation Patient Details Name: [name] MRN: [id number] DOB: Feb 07, 1936 Today's Date: 05/20/2024  History of Present Illness  Pt is an 88 y.o. female who presented due to a fall. She was found to have L  intertrochanteric femur fracture. Pt s/p LLE cephulomedullary nailing on 05/18/24 with WBAT. Pt required 1 unit pRBCS.   PMH: CKD, afib, pacemaker, CHF, HTN, epilepsy, osteopenia, laparoscopic cholecystectomy on 04/23/22.  Clinical Impression  Pt admitted with above diagnosis.  Pt amb with rollator at baseline, from [facility]. Pt very sleepy/lethargic this pm but is agreeable to PT.  +2 max assist for bed mobility, +2 mod/max for STS x3.  Patient will benefit from continued inpatient follow up therapy, <3 hours/day   Pt currently with functional limitations due to the deficits listed below (see PT Problem List). Pt will benefit from acute skilled PT to increase their independence and safety with mobility to allow discharge.           If plan is discharge home, recommend the following: Two people to help with walking and/or transfers;Two people to help with bathing/dressing/bathroom;Assist for transportation;Help with stairs or ramp for entrance;Assistance with cooking/housework   Can travel by private vehicle   No    Equipment Recommendations None recommended by PT  Recommendations for Other Services       Functional Status Assessment Patient has had a recent decline in their functional status and demonstrates the ability to make significant improvements in function in a reasonable and predictable amount of time.     Precautions / Restrictions Precautions Precautions: Fall Recall of Precautions/Restrictions: Intact Restrictions Weight Bearing Restrictions Per Provider Order: No LLE Weight Bearing Per Provider Order: Weight bearing as tolerated      Mobility  Bed Mobility Overal bed mobility: Needs Assistance Bed Mobility: Supine to Sit, Sit  to Supine     Supine to sit: +2 for physical assistance, +2 for safety/equipment, Max assist Sit to supine: Max assist, +2 for physical assistance, +2 for safety/equipment, HOB elevated, Used rails   General bed mobility comments: multi-modal cues, pt arouses however has difficulty following commands to self assist; +2 assist for supine <>sit with pt needing assist with trunk and bil LEs.    Transfers Overall transfer level: Needs assistance Equipment used: Rolling walker (2 wheels), 2 person hand held assist               General transfer comment: STS x3, 2x to RW and once with bil HHA and support at knees to prevent buckling. pt unable to come to full standing. maintains cervical flexion - pt stating I am still asleep    Ambulation/Gait               General Gait Details: unable to safely attempt  Stairs            Wheelchair Mobility     Tilt Bed    Modified Rankin (Stroke Patients Only)       Balance Overall balance assessment: History of Falls, Needs assistance Sitting-balance support: Feet supported, No upper extremity supported Sitting balance-Leahy Scale: Fair Sitting balance - Comments: wtih incr time pt was able to sit EOB -close supervision for safety     Standing balance-Leahy Scale: Zero                               Pertinent Vitals/Pain Pain Assessment Pain Assessment: Faces Faces Pain Scale: Hurts even  more Pain Location: LLE wtih activity Pain Descriptors / Indicators: Grimacing, Guarding Pain Intervention(s): Limited activity within patient's tolerance, Monitored during session, Repositioned    Home Living Family/patient expects to be discharged to:: Skilled nursing facility                 Home Equipment: Rollator (4 wheels) Additional Comments: from [facility]    Prior Function Prior Level of Function : Needs assist             Mobility Comments: ambulatory 0--Used a 4WW ADLs Comments:  Supervision mostly with her showers, but someone is there if she needs assist. Needs assist with getting TED hose on but other than that can do her own dressing.     Extremity/Trunk Assessment   Upper Extremity Assessment Upper Extremity Assessment: Defer to OT evaluation    Lower Extremity Assessment Lower Extremity Assessment: LLE deficits/detail;RLE deficits/detail RLE Deficits / Details: grossly WFL LLE Deficits / Details: AAROM WFL, testing limited d/t lethargy and pain       Communication   Communication Communication: No apparent difficulties    Cognition Arousal: Lethargic Behavior During Therapy: WFL for tasks assessed/performed   PT - Cognitive impairments: Difficult to assess Difficult to assess due to: Level of arousal                       Following commands: Impaired Following commands impaired: Follows one step commands inconsistently, Follows one step commands with increased time     Cueing Cueing Techniques: Verbal cues, Tactile cues     General Comments      Exercises     Assessment/Plan    PT Assessment Patient needs continued PT services  PT Problem List Decreased strength;Decreased activity tolerance;Decreased balance;Decreased knowledge of use of DME;Pain;Decreased mobility       PT Treatment Interventions DME instruction;Therapeutic exercise;Gait training;Functional mobility training;Therapeutic activities;Patient/family education    PT Goals (Current goals can be found in the Care Plan section)  Acute Rehab PT Goals Patient Stated Goal: rehab PT Goal Formulation: With family Time For Goal Achievement: 06/03/24 Potential to Achieve Goals: Good    Frequency Min 3X/week     Co-evaluation               AM-PAC PT 6 Clicks Mobility  Outcome Measure Help needed turning from your back to your side while in a flat bed without using bedrails?: Total Help needed moving from lying on your back to sitting on the side of a  flat bed without using bedrails?: Total Help needed moving to and from a bed to a chair (including a wheelchair)?: Total Help needed standing up from a chair using your arms (e.g., wheelchair or bedside chair)?: Total Help needed to walk in hospital room?: Total Help needed climbing 3-5 steps with a railing? : Total 6 Click Score: 6    End of Session Equipment Utilized During Treatment: Gait belt Activity Tolerance: Patient limited by lethargy Patient left: in bed;with call bell/phone within reach;with bed alarm set;with family/visitor present Nurse Communication: Mobility status PT Visit Diagnosis: Other abnormalities of gait and mobility (R26.89);History of falling (Z91.81)    Time: 8393-8375 PT Time Calculation (min) (ACUTE ONLY): 18 min   Charges:   PT Evaluation $PT Eval Low Complexity: 1 Low   PT General Charges $$ ACUTE PT VISIT: 1 Visit         Alfard Cochrane, PT  Acute Rehab Dept [facility]) [phone]  05/20/2024   [facility] 05/20/2024, 4:37  PM

## 2024-05-20 NOTE — TOC Transition Note (Signed)
 Transition of Care [facility]) - Discharge Note   Patient Details  Name: [name] MRN: [id number] Date of Birth: [birth year]  [facility] of [facility]) CM/SW Contact:  Kelsey JONELLE Rex, RN Phone Number: 05/20/2024, 3:39 PM   Clinical Narrative:   DC to SNF ( [facility]), [place], admit  coordinator w/ [facility], confirmed bed available for transfer today, RM 151. [facility] for transport. No further INPT CM needs at this time.     Final next level of care: Skilled Nursing Facility Barriers to Discharge: Barriers Resolved   Patient Goals and CMS Choice Patient states their goals for this hospitalization and ongoing recovery are:: return to [facility] CMS Medicare.gov Compare Post Acute Care list provided to:: Patient Choice offered to / list presented to : Patient [place] ownership interest in [facility].provided to:: Patient    Discharge Placement              Patient chooses bed at: [facility] Patient to be transferred to facility by: [facility] Name of family member notified: family at bedside Patient and family notified of of transfer: 05/20/24  Discharge Plan and Services Additional resources added to the After Visit Summary for                                       Social Drivers of Health (SDOH) Interventions SDOH Screenings   Food Insecurity: No Food Insecurity (05/17/2024)  Housing: Low Risk  (05/17/2024)  Transportation Needs: No Transportation Needs (05/17/2024)  Utilities: Not At Risk (05/17/2024)  Alcohol  Screen: Low Risk  (08/05/2023)  Depression (PHQ2-9): Low Risk  (08/13/2023)  Social Connections: Moderately Integrated (05/17/2024)  Tobacco Use: Medium Risk (05/18/2024)     Readmission Risk Interventions    05/18/2024   10:14 AM  Readmission Risk Prevention Plan  Transportation Screening Complete  PCP or Specialist Appt within 5-7 Days Complete  Home Care Screening Complete  Medication Review (RN CM) Complete

## 2024-05-20 NOTE — Discharge Summary (Signed)
 Physician Discharge Summary  [name] FMW:[id number] DOB: [birth year] DOA: 05/17/2024  PCP: [name], MD  Admit date: 05/17/2024 Discharge date: 05/20/2024  Admitted From: ALF Disposition: SNF  Recommendations for Outpatient Follow-up:  Follow up with PCP in 1-2 weeks Please obtain BMP/CBC in one week your next doctors visit.  Full dose aspirin  for DVT prophylaxis per orthopedic Taper Keppra  over next 3 days, 250 mg daily   Discharge Condition: Stable CODE STATUS: DNR Diet recommendation: Heart healthy  Brief/Interim Summary: Brief Narrative:   88yo with h/o stage 3b CKD, afib on on AC due to cerebral AVM, pacemaker, CAD, MCI, and chronic HFpEF who presented on 12/7 with a mechanical fall. She has a left intertrochanteric femur fracture. Head CT with known AVM with surrounding vasogenic edema causing some mass effect on the anterior horn of the L ventricle; neurovascular interventionalist did not think acute intervention was needed. Orthopedics performed ORIF 12/8.  Neurology recommended continuing home Dilantin  and phenobarbital  and adding perioperative Keppra . Patient performed well postoperatively.  PT/OT recommended SNF.  Due to drowsiness Keppra  was reduced and will be tapered off over next 3 days as this was only started perioperatively per neurology recommendations.  Medically stable for discharge to SNF Voiding trial in 1 week if not she will need follow-up with urology  Assessment & Plan:   Closed displaced intertrochanteric fracture of left femur (HCC) Status post IM nail by orthopedic on 12/8.  Postop management per orthopedic.  Recommending daily full dose of aspirin , weightbearing as tolerated.  Did require 1 unit of PRBC transfusion.  Cerebral AV malformation Partial symptomatic epilepsy with complex partial seizures, not intractable, without status epilepticus [facility]) Prior provider discussed with Dr. Maude Naegeli of neurovascular intervention, recommended  to continue phenobarbital  and Dilantin .  Taper Keppra  over next 3 days  ABLA (acute blood loss anemia) Baseline chronic anemia, Hgb 8.9 on presentation Postop due to some blood loss required 1 unit PRBC transfusion on 12/9  Acute urinary retention Recurrent retention issue during this hospitalization requiring Foley catheter placement.  Will discharge her with a catheter with 1 week urology follow-up.  Chronic diastolic CHF (congestive heart failure) (HCC) MVP (mitral valve prolapse) STAT echo requested by anesthesia related to prior h/o pericardial effusion and pulmonary HTN Echo 12/8 with presented ED and grade 3 diastolic CHF Moderate pulmonary HTN, small pericardial effusion present  Paroxysmal atrial fibrillation (HCC) Pacemaker Appears to be rate controlled, pacer in place.  Not on anticoagulation candidate  Pressure injury of skin Wound 05/17/24 1948 Pressure Injury Coccyx Right;Left;Medial Stage 1 -  Intact skin with non-blanchable redness of a localized area usually over a bony prominence. (Active)  POA  Hypertension Continue Norvasc , ARB.  IV as needed  Chronic kidney disease, stage 3b (HCC) Creatinine around baseline of 1.3  DNR (do not resuscitate) DNR in place   Consultants: Orthopedics Neurosurgery by telephone only   Procedures: ORIF 12/8   Antibiotics: None  DVT prophylaxis: Place and maintain sequential compression device Start: 05/20/24 0144    Code Status: Do not attempt resuscitation (DNR) PRE-ARREST INTERVENTIONS DESIRED Family Communication:   Status is: Inpatient Remains inpatient appropriate because: Hopefully discharge back to her facility today   PT Follow up Recs:   Subjective:  Feels well no complaints  Examination:  General exam: Appears calm and comfortable  Respiratory system: Clear to auscultation. Respiratory effort normal. Cardiovascular system: S1 & S2 heard, RRR. No JVD, murmurs, rubs, gallops or clicks. No pedal  edema. Gastrointestinal system: Abdomen  is nondistended, soft and nontender. No organomegaly or masses felt. Normal bowel sounds heard. Central nervous system: Alert and oriented. No focal neurological deficits. Extremities: Symmetric 5 x 5 power.  Limited range of motion of her right elbow due to pain and discomfort Skin: No rashes, lesions or ulcers Psychiatry: Judgement and insight appear normal. Mood & affect appropriate.    Discharge Diagnoses:  Principal Problem:   Closed displaced intertrochanteric fracture of left femur (HCC) Active Problems:   Cerebral AV malformation   Hypertension   MVP (mitral valve prolapse)   Partial symptomatic epilepsy with complex partial seizures, not intractable, without status epilepticus (HCC)   Pressure injury of skin   Pacemaker   Paroxysmal atrial fibrillation (HCC)   Chronic diastolic CHF (congestive heart failure) (HCC)   Chronic kidney disease, stage 3b (HCC)   DNR (do not resuscitate)   ABLA (acute blood loss anemia)   Acute urinary retention      Discharge Exam: Vitals:   05/20/24 0557 05/20/24 1352  BP: (!) 130/43 (!) 131/38  Pulse: (!) 59 60  Resp: 15 15  Temp: 97.9 F (36.6 C) 98.5 F (36.9 C)  SpO2: 96% 94%   Vitals:   05/19/24 1709 05/19/24 2147 05/20/24 0557 05/20/24 1352  BP: (!) 129/40 (!) 136/38 (!) 130/43 (!) 131/38  Pulse: 60 62 (!) 59 60  Resp: 16 17 15 15   Temp: (!) 97.4 F (36.3 C) 97.6 F (36.4 C) 97.9 F (36.6 C) 98.5 F (36.9 C)  TempSrc: Oral Oral Oral Oral  SpO2: 95% 98% 96% 94%  Weight:      Height:          Discharge Instructions   Allergies as of 05/20/2024       Reactions   Aleve [naproxen] Other (See Comments)   Azithromycin Other (See Comments)   Reaction not recalled   Penicillins Hives, Other (See Comments)   Bad reaction as a child after a shot of Penicillin Tolerated Ancef  for orthopedic procedure 05/18/24   Pravastatin Other (See Comments)   Reaction not recalled   Sulfa  Antibiotics Other (See Comments)   Sulfa Drugs Cross Reactors Other (See Comments)   Reaction not recalled- stated she can take it in small doses   Tape Itching, Other (See Comments)   Cannot wear for an extended period of time   Naproxen Sodium Rash        Medication List     TAKE these medications    acetaminophen  500 MG tablet Commonly known as: TYLENOL  Take 1 tablet (500 mg total) by mouth every 6 (six) hours as needed for mild pain (pain score 1-3) or headache.   amLODipine  5 MG tablet Commonly known as: NORVASC  Take 1 tablet (5 mg total) by mouth daily.   aspirin  325 MG tablet Take 1 tablet (325 mg total) by mouth daily.   b complex vitamins capsule Take 1 capsule by mouth daily.   bisacodyl  5 MG EC tablet Commonly known as: DULCOLAX Take 1 tablet (5 mg total) by mouth daily as needed for moderate constipation.   BOOST ORIGINAL PO Take by mouth as directed. Give 1 can orally one time a day   docusate sodium  100 MG capsule Commonly known as: COLACE Take 1 capsule (100 mg total) by mouth 2 (two) times daily.   fluticasone  50 MCG/ACT nasal spray Commonly known as: FLONASE  Place 1-2 sprays into both nostrils daily.   guaiFENesin -dextromethorphan 100-10 MG/5ML syrup Commonly known as: ROBITUSSIN DM Take 10 mLs  by mouth every 6 (six) hours as needed for cough.   hydrALAZINE  10 MG tablet Commonly known as: APRESOLINE  Take 1 tablet (10 mg total) by mouth 2 (two) times daily as needed. Sbp >170   ipratropium-albuterol  0.5-2.5 (3) MG/3ML Soln Commonly known as: DUONEB Take 3 mLs by nebulization every 6 (six) hours as needed (SOB/Wheezing).   Iron  (Ferrous Sulfate ) 325 (65 Fe) MG Tabs Take 325 mg by mouth 3 (three) times a week. What changed: additional instructions   levETIRAcetam  250 MG tablet Commonly known as: KEPPRA  Take 1 tablet (250 mg total) by mouth daily in the afternoon for 3 days. Start taking on: May 21, 2024   loperamide  2 MG  tablet Commonly known as: IMODIUM  A-D Take 2 mg by mouth every 8 (eight) hours as needed for diarrhea or loose stools.   magnesium  oxide 400 (240 Mg) MG tablet Commonly known as: MAG-OX Take 400 mg by mouth 2 (two) times daily.   ondansetron  4 MG disintegrating tablet Commonly known as: ZOFRAN -ODT Take 2 mg by mouth every 4 (four) hours as needed for nausea or vomiting.   oxyCODONE  5 MG immediate release tablet Commonly known as: Roxicodone  Take 1 tablet (5 mg total) by mouth every 4 (four) hours as needed for severe pain (pain score 7-10) or breakthrough pain.   PHENobarbital  64.8 MG tablet Commonly known as: LUMINAL Take 1 tablet (64.8 mg total) by mouth at bedtime for 7 days.   phenytoin  100 MG ER capsule Commonly known as: DILANTIN  Take 2 capsules (200 mg total) by mouth daily for 7 days.   Systane 0.4-0.3 % Soln Generic drug: Polyethyl Glycol-Propyl Glycol Place 1 drop into both eyes in the morning and at bedtime.   tamsulosin  0.4 MG Caps capsule Commonly known as: FLOMAX  Take 1 capsule (0.4 mg total) by mouth daily after supper.   torsemide  10 MG tablet Commonly known as: DEMADEX  Take 1 tablet (10 mg total) by mouth 3 (three) times a week. What changed: additional instructions   triamcinolone  cream 0.1 % Commonly known as: KENALOG  Apply 1 Application topically 2 (two) times daily as needed.   valsartan  160 MG tablet Commonly known as: Diovan  Take 1 tablet (160 mg total) by mouth 2 (two) times daily.        Follow-up Information     Genelle Standing, MD Follow up.   Specialty: Orthopedic Surgery Contact information: [address] [place] [address] [phone]         [name], MD Follow up in 1 week(s).   Specialty: Internal Medicine Contact information: [address] [place] [address] [phone]                Allergies  Allergen Reactions   Aleve [Naproxen] Other (See Comments)   Azithromycin  Other (See Comments)    Reaction not recalled   Penicillins Hives and Other (See Comments)    Bad reaction as a child after a shot of Penicillin Tolerated Ancef  for orthopedic procedure 05/18/24   Pravastatin Other (See Comments)    Reaction not recalled   Sulfa Antibiotics Other (See Comments)   Sulfa Drugs Cross Reactors Other (See Comments)    Reaction not recalled- stated she can take it in small doses   Tape Itching and Other (See Comments)    Cannot wear for an extended period of time   Naproxen Sodium Rash    You were cared for by a hospitalist during your hospital stay. If you have  any questions about your discharge medications or the care you received while you were in the hospital after you are discharged, you can call the unit and asked to speak with the hospitalist on call if the hospitalist that took care of you is not available. Once you are discharged, your primary care physician will handle any further medical issues. Please note that no refills for any discharge medications will be authorized once you are discharged, as it is imperative that you return to your primary care physician (or establish a relationship with a primary care physician if you do not have one) for your aftercare needs so that they can reassess your need for medications and monitor your lab values.  You were cared for by a hospitalist during your hospital stay. If you have any questions about your discharge medications or the care you received while you were in the hospital after you are discharged, you can call the unit and asked to speak with the hospitalist on call if the hospitalist that took care of you is not available. Once you are discharged, your primary care physician will handle any further medical issues. Please note that NO REFILLS for any discharge medications will be authorized once you are discharged, as it is imperative that you return to your primary care physician (or establish a relationship  with a primary care physician if you do not have one) for your aftercare needs so that they can reassess your need for medications and monitor your lab values.  Please request your Prim.MD to go over all Hospital Tests and Procedure/Radiological results at the follow up, please get all Hospital records sent to your Prim MD by signing hospital release before you go home.  Get CBC, CMP, 2 view Chest X ray checked  by Primary MD during your next visit or SNF MD in 5-7 days ( we routinely change or add medications that can affect your baseline labs and fluid status, therefore we recommend that you get the mentioned basic workup next visit with your PCP, your PCP may decide not to get them or add new tests based on their clinical decision)  On your next visit with your primary care physician please Get Medicines reviewed and adjusted.  If you experience worsening of your admission symptoms, develop shortness of breath, life threatening emergency, suicidal or homicidal thoughts you must seek medical attention immediately by calling 911 or calling your MD immediately  if symptoms less severe.  You Must read complete instructions/literature along with all the possible adverse reactions/side effects for all the Medicines you take and that have been prescribed to you. Take any new Medicines after you have completely understood and accpet all the possible adverse reactions/side effects.   Do not drive, operate heavy machinery, perform activities at heights, swimming or participation in water  activities or provide baby sitting services if your were admitted for syncope or siezures until you have seen by Primary MD or a Neurologist and advised to do so again.  Do not drive when taking Pain medications.   Procedures/Studies: DG FEMUR MIN 2 VIEWS LEFT Result Date: 05/18/2024 CLINICAL DATA:  Left femur fixation EXAM: LEFT FEMUR 2 VIEWS COMPARISON:  Left hip radiograph dated 05/17/2024 FINDINGS: Seven fluoroscopic  images obtained during left femur fixation. 2 minutes 10 seconds fluoro time utilized. Radiation dose 6.83 mGy Kerma. Please see performing physicians operative report for full details. IMPRESSION: Fluoroscopic images were obtained for intraoperative guidance of left femur fixation. Electronically Signed   By: Limin  Xu M.D.   On: 05/18/2024 20:31   DG C-Arm 1-60 Min-No Report Result Date: 05/18/2024 Fluoroscopy was utilized by the requesting physician.  No radiographic interpretation.   ECHOCARDIOGRAM COMPLETE Result Date: 05/18/2024    ECHOCARDIOGRAM REPORT   Patient Name:   [name] Date of Exam: 05/18/2024 Medical Rec #:  [id number]        Height:       65.0 in Accession #:    [id number]       Weight:       121.3 lb Date of Birth:  [birth year]        BSA:          1.599 m Patient Age:    88 years         BP:           175/42 mmHg Patient Gender: F                HR:           60 bpm. Exam Location:  Inpatient Procedure: 2D Echo (Both Spectral and Color Flow Doppler were utilized during            procedure). Indications:    PHTN  History:        Patient has prior history of Echocardiogram examinations.                 Pericardial Disease, Pacemaker; Pulmonary HTN.  Sonographer:    Norleen Amour Referring Phys: [id number] JENNIFER YATES IMPRESSIONS  1. Left ventricular ejection fraction, by estimation, is 55 to 60%. Left ventricular ejection fraction by 2D MOD biplane is 59.2 %. The left ventricle has normal function. The left ventricle has no regional wall motion abnormalities. Left ventricular diastolic parameters are consistent with Grade III diastolic dysfunction (restrictive). Elevated left ventricular end-diastolic pressure.  2. Right ventricular systolic function is mildly reduced. The right ventricular size is normal. There is moderately elevated pulmonary artery systolic pressure. The estimated right ventricular systolic pressure is 51.8 mmHg.  3. Left atrial size was severely dilated.  4. A small  pericardial effusion is present. The pericardial effusion is posterior to the left ventricle.  5. The mitral valve is grossly normal. Trivial mitral valve regurgitation.  6. The aortic valve is tricuspid. Aortic valve regurgitation is not visualized.  7. The inferior vena cava is normal in size with <50% respiratory variability, suggesting right atrial pressure of 8 mmHg. Comparison(s): Changes from prior study are noted. 08/23/2022: LVEF 60-65%, moderate posterior pericardial effusion. FINDINGS  Left Ventricle: Left ventricular ejection fraction, by estimation, is 55 to 60%. Left ventricular ejection fraction by 2D MOD biplane is 59.2 %. The left ventricle has normal function. The left ventricle has no regional wall motion abnormalities. The left ventricular internal cavity size was normal in size. There is no left ventricular hypertrophy. Abnormal (paradoxical) septal motion, consistent with RV pacemaker. Left ventricular diastolic parameters are consistent with Grade III diastolic dysfunction (restrictive). Elevated left ventricular end-diastolic pressure. Right Ventricle: The right ventricular size is normal. No increase in right ventricular wall thickness. Right ventricular systolic function is mildly reduced. There is moderately elevated pulmonary artery systolic pressure. The tricuspid regurgitant velocity is 3.31 m/s, and with an assumed right atrial pressure of 8 mmHg, the estimated right ventricular systolic pressure is 51.8 mmHg. Left Atrium: Left atrial size was severely dilated. Right Atrium: Right atrial size was normal in size. Pericardium: A small pericardial effusion is present. The pericardial effusion is posterior  to the left ventricle. Mitral Valve: The mitral valve is grossly normal. Trivial mitral valve regurgitation. MV peak gradient, 14.6 mmHg. The mean mitral valve gradient is 3.0 mmHg. Tricuspid Valve: The tricuspid valve is grossly normal. Tricuspid valve regurgitation is mild. Aortic  Valve: The aortic valve is tricuspid. Aortic valve regurgitation is not visualized. Aortic valve mean gradient measures 6.0 mmHg. Aortic valve peak gradient measures 11.4 mmHg. Aortic valve area, by VTI measures 1.75 cm. Pulmonic Valve: The pulmonic valve was grossly normal. Pulmonic valve regurgitation is trivial. Aorta: The aortic root and ascending aorta are structurally normal, with no evidence of dilitation. Venous: The inferior vena cava is normal in size with less than 50% respiratory variability, suggesting right atrial pressure of 8 mmHg. IAS/Shunts: No atrial level shunt detected by color flow Doppler. Additional Comments: A device lead is visualized.  LEFT VENTRICLE PLAX 2D                        Biplane EF (MOD) LVIDd:         5.50 cm         LV Biplane EF:   Left LVIDs:         4.20 cm                          ventricular LV PW:         0.80 cm                          ejection LV IVS:        0.90 cm                          fraction by LVOT diam:     1.70 cm                          2D MOD LV SV:         60                               biplane is LV SV Index:   37                               59.2 %. LVOT Area:     2.27 cm                                Diastology                                LV e' medial:    4.99 cm/s LV Volumes (MOD)               LV E/e' medial:  37.1 LV vol d, MOD    71.7 ml       LV e' lateral:   7.89 cm/s A2C:                           LV E/e' lateral: 23.4 LV vol d, MOD    72.5 ml A4C: LV vol s, MOD  29.6 ml A2C: LV vol s, MOD    29.6 ml A4C: LV SV MOD A2C:   42.1 ml LV SV MOD A4C:   72.5 ml LV SV MOD BP:    42.9 ml RIGHT VENTRICLE            IVC RV Basal diam:  2.70 cm    IVC diam: 1.60 cm RV S prime:     9.29 cm/s TAPSE (M-mode): 1.7 cm     PULMONARY VEINS                            Diastolic Velocity: 58.00 cm/s                            S/D Velocity:       0.60                            Systolic Velocity:  33.70 cm/s LEFT ATRIUM              Index        RIGHT  ATRIUM           Index LA diam:        3.80 cm  2.38 cm/m   RA Area:     14.80 cm LA Vol (A2C):   107.0 ml 66.91 ml/m  RA Volume:   30.50 ml  19.07 ml/m LA Vol (A4C):   91.6 ml  57.28 ml/m LA Biplane Vol: 101.0 ml 63.16 ml/m  AORTIC VALVE                     PULMONIC VALVE AV Area (Vmax):    1.81 cm      PV Vmax:       1.45 m/s AV Area (Vmean):   1.72 cm      PV Peak grad:  8.4 mmHg AV Area (VTI):     1.75 cm AV Vmax:           169.00 cm/s AV Vmean:          114.000 cm/s AV VTI:            0.343 m AV Peak Grad:      11.4 mmHg AV Mean Grad:      6.0 mmHg LVOT Vmax:         135.00 cm/s LVOT Vmean:        86.500 cm/s LVOT VTI:          0.264 m LVOT/AV VTI ratio: 0.77  AORTA Ao Root diam: 2.80 cm Ao Asc diam:  2.20 cm MITRAL VALVE                TRICUSPID VALVE MV Area (PHT): 4.12 cm     TR Peak grad:   43.8 mmHg MV Area VTI:   1.26 cm     TR Vmax:        331.00 cm/s MV Peak grad:  14.6 mmHg MV Mean grad:  3.0 mmHg     SHUNTS MV Vmax:       1.91 m/s     Systemic VTI:  0.26 m MV Vmean:      73.6 cm/s    Systemic Diam: 1.70 cm MV Decel Time: 184 msec MV E velocity: 185.00 cm/s MV A velocity: 58.90 cm/s MV E/A ratio:  3.14 Vinie  Hilty MD Electronically signed by Vinie Maxcy MD Signature Date/Time: 05/18/2024/2:03:14 PM    Final    CT Cervical Spine Wo Contrast Result Date: 05/17/2024 EXAM: CT CERVICAL SPINE WITHOUT CONTRAST 05/17/2024 12:53:59 PM TECHNIQUE: CT of the cervical spine was performed without the administration of intravenous contrast. Multiplanar reformatted images are provided for review. Automated exposure control, iterative reconstruction, and/or weight based adjustment of the mA/kV was utilized to reduce the radiation dose to as low as reasonably achievable. COMPARISON: CT of the cervical spine dated 10/15/2021. CLINICAL HISTORY: Neck trauma (Age >= 65y). FINDINGS: CERVICAL SPINE: BONES AND ALIGNMENT: Mild reversal of the normal cervical lordosis. No acute fracture or traumatic  malalignment. DEGENERATIVE CHANGES: Extensive diffuse degenerative changes throughout the cervical spine. Marked hypertrophic changes involving the atlantoaxial joint, with bone overgrowth and cyst formation within the dens. Moderate central spinal canal stenosis at the craniocervical junction. Posterior endplate ridging and ligamentum flavum thickening and calcification resulting in multilevel moderate-to-severe central spinal canal stenosis, which is greatest at C4-C5. Multilevel neuroforaminal stenosis. SOFT TISSUES: No prevertebral soft tissue swelling. Moderate calcification within the carotid bulbs. Mild biapical pleural scarring. IMPRESSION: 1. No acute abnormality of the cervical spine related to the reported neck trauma. 2. Extensive diffuse degenerative changes throughout the cervical spine, including marked hypertrophic changes involving the atlantoaxial joint with bone overgrowth and cyst formation within the dens, and multilevel moderate-to-severe central spinal canal stenosis, greatest at C4-5. Electronically signed by: Evalene Coho MD 05/17/2024 01:04 PM EST RP Workstation: [id number]   CT Head Wo Contrast Result Date: 05/17/2024 EXAM: CT HEAD WITHOUT CONTRAST 05/17/2024 12:53:59 PM TECHNIQUE: CT of the head was performed without the administration of intravenous contrast. Automated exposure control, iterative reconstruction, and/or weight based adjustment of the mA/kV was utilized to reduce the radiation dose to as low as reasonably achievable. COMPARISON: CT of the head dated 04/25/2022. CLINICAL HISTORY: Head trauma, moderate-severe. FINDINGS: BRAIN AND VENTRICLES: No acute hemorrhage. No evidence of acute infarct. Partially calcified mass in anterior left frontal lobe with surrounding vasogenic edema remains compatible with arteriovenous malformation. Mass effect on anterior horn of left lateral ventricle. No hydrocephalus. No extra-axial collection. Atherosclerosis of skullbase vasculature  without hyperdense vessel or abnormal calcification. ORBITS: No acute abnormality. SINUSES: Mucosal thickening in left maxillary sinus. SOFT TISSUES AND SKULL: No acute soft tissue abnormality. No skull fracture. IMPRESSION: 1. Partially calcified mass in anterior left frontal lobe with surrounding vasogenic edema, compatible with arteriovenous malformation, causing mass effect on anterior horn of left lateral ventricle. 2. Mucosal thickening in left maxillary sinus. Electronically signed by: Evalene Coho MD 05/17/2024 01:00 PM EST RP Workstation: [id number]   DG Hip Unilat With Pelvis 2-3 Views Left Result Date: 05/17/2024 EXAM: 2 or 3 VIEW(S) XRAY OF THE LEFT HIP 05/17/2024 12:36:00 PM COMPARISON: None available. CLINICAL HISTORY: fall FINDINGS: BONES AND JOINTS: There is an intertrochanteric fracture of the left femur with mild varus angulation. The bony pelvis appears to be intact. No malalignment. SOFT TISSUES: The soft tissues are unremarkable. LUMBAR SPINE: There is diffuse chronic degenerative disc disease throughout the lower lumbar spine and there is levoscoliotic curvature of the lumbar spine. IMPRESSION: 1. Intertrochanteric fracture of the left femur with mild varus angulation. 2. Intact bony pelvis. 3. Diffuse chronic degenerative disc disease throughout the lower lumbar spine and levoscoliotic curvature of the lumbar spine. Electronically signed by: Evalene Coho MD 05/17/2024 12:55 PM EST RP Workstation: [id number]   DG Chest Port 1 View Result Date: 05/17/2024 EXAM: 1 VIEW(S) XRAY OF  THE CHEST 05/17/2024 12:37:26 PM COMPARISON: 09/27/2023 CLINICAL HISTORY: fall FINDINGS: LINES, TUBES AND DEVICES: Left chest cardiac pacing device noted. LUNGS AND PLEURA: Stable left retrocardiac opacity. Stable left pleural effusion. No pneumothorax. HEART AND MEDIASTINUM: Cardiomegaly, unchanged. Atherosclerotic calcifications noted. BONES AND SOFT TISSUES: Severe degenerative changes of left shoulder.  IMPRESSION: 1. Stable left retrocardiac opacity and left pleural effusion. 2. Cardiomegaly, unchanged, with left chest cardiac pacing device in place. Electronically signed by: Evalene Coho MD 05/17/2024 12:53 PM EST RP Workstation: [id number]   DG ELBOW COMPLETE RIGHT (3+VIEW) Result Date: 05/17/2024 EXAM: 3 VIEW(S) XRAY OF THE RIGHT ELBOW COMPARISON: None available. CLINICAL HISTORY: [id number] Fall [id number] FINDINGS: BONES AND JOINTS: The bones appear osteopenic. There is mild chondrocalcinosis present within the radioulnar joint. There is enthesophyte formation along the olecranon. No acute fracture. No malalignment. A true lateral view of the elbow was not obtained. SOFT TISSUES: The soft tissues are unremarkable. IMPRESSION: 1. No acute fracture. 2. Mild chondrocalcinosis within the radioulnar joint. 3. Enthesophyte formation along the olecranon. 4. Osteopenic bones. Electronically signed by: Evalene Coho MD 05/17/2024 12:52 PM EST RP Workstation: [id number]   CUP PACEART REMOTE DEVICE CHECK Result Date: 05/13/2024 PPM Scheduled remote reviewed. Normal device function.  Presenting rhythm: VP. Next remote transmission per protocol. - CS, CVRS    The results of significant diagnostics from this hospitalization (including imaging, microbiology, ancillary and laboratory) are listed below for reference.     Microbiology: Recent Results (from the past 240 hours)  Surgical PCR screen     Status: Abnormal   Collection Time: 05/17/24  8:14 PM   Specimen: Nasal Mucosa; Nasal Swab  Result Value Ref Range Status   MRSA, PCR NEGATIVE NEGATIVE Final   Staphylococcus aureus POSITIVE (A) NEGATIVE Final    Comment: (NOTE) The Xpert SA Assay (FDA approved for NASAL specimens in patients 78 years of age and older), is one component of a comprehensive surveillance program. It is not intended to diagnose infection nor to guide or monitor treatment. Performed at [facility], [address], [place], [place] [address]      Labs: BNP (last 3 results) Recent Labs    09/25/23 1245  BNP 104.7*   Basic Metabolic Panel: Recent Labs  Lab 05/17/24 1314 05/18/24 0325 05/19/24 0339 05/20/24 0358  NA 140 139 138 132*  K 4.6 4.2 4.4 4.6  CL 106 106 105 100  CO2 23 23 24 23   GLUCOSE 119* 127* 112* 105*  BUN 46* 44* 45* 51*  CREATININE 1.30* 1.22* 1.43* 1.47*  CALCIUM  9.7 9.6 9.1 8.4*   Liver Function Tests: No results for input(s): AST, ALT, ALKPHOS, BILITOT, PROT, ALBUMIN in the last 168 hours. No results for input(s): LIPASE, AMYLASE in the last 168 hours. No results for input(s): AMMONIA in the last 168 hours. CBC: Recent Labs  Lab 05/17/24 1314 05/18/24 0325 05/19/24 0339 05/20/24 0358  WBC 9.8 9.5 9.6 7.7  NEUTROABS 8.0*  --   --   --   HGB 8.9* 8.3* 7.8* 8.6*  HCT 27.1* 25.8* 24.1* 25.2*  MCV 103.0* 104.9* 104.8* 96.2  PLT 187 143* 130* 127*   Cardiac Enzymes: No results for input(s): CKTOTAL, CKMB, CKMBINDEX, TROPONINI in the last 168 hours. BNP: Invalid input(s): POCBNP CBG: No results for input(s): GLUCAP in the last 168 hours. D-Dimer No results for input(s): DDIMER in the last 72 hours. Hgb A1c No results for input(s): HGBA1C in the last 72 hours. Lipid Profile No results for  input(s): CHOL, HDL, LDLCALC, TRIG, CHOLHDL, LDLDIRECT in the last 72 hours. Thyroid  function studies No results for input(s): TSH, T4TOTAL, T3FREE, THYROIDAB in the last 72 hours.  Invalid input(s): FREET3 Anemia work up No results for input(s): VITAMINB12, FOLATE, FERRITIN, TIBC, IRON , RETICCTPCT in the last 72 hours. Urinalysis    Component Value Date/Time   COLORURINE AMBER (A) 09/25/2023 1546   APPEARANCEUR TURBID (A) 09/25/2023 1546   LABSPEC 1.011 09/25/2023 1546   PHURINE 5.0 09/25/2023 1546   GLUCOSEU NEGATIVE 09/25/2023 1546   HGBUR MODERATE (A) 09/25/2023 1546   BILIRUBINUR  NEGATIVE 09/25/2023 1546   KETONESUR NEGATIVE 09/25/2023 1546   PROTEINUR 30 (A) 09/25/2023 1546   NITRITE NEGATIVE 09/25/2023 1546   LEUKOCYTESUR LARGE (A) 09/25/2023 1546   Sepsis Labs Recent Labs  Lab 05/17/24 1314 05/18/24 0325 05/19/24 0339 05/20/24 0358  WBC 9.8 9.5 9.6 7.7   Microbiology Recent Results (from the past 240 hours)  Surgical PCR screen     Status: Abnormal   Collection Time: 05/17/24  8:14 PM   Specimen: Nasal Mucosa; Nasal Swab  Result Value Ref Range Status   MRSA, PCR NEGATIVE NEGATIVE Final   Staphylococcus aureus POSITIVE (A) NEGATIVE Final    Comment: (NOTE) The Xpert SA Assay (FDA approved for NASAL specimens in patients 52 years of age and older), is one component of a comprehensive surveillance program. It is not intended to diagnose infection nor to guide or monitor treatment. Performed at [facility], [address], [place], [place] [address]      Time coordinating discharge:  I have spent 35 minutes face to face with the patient and on the ward discussing the patients care, assessment, plan and disposition with other care givers. >50% of the time was devoted counseling the patient about the risks and benefits of treatment/Discharge disposition and coordinating care.   SIGNED:   Burgess JAYSON Dare, MD  [facility] 05/20/2024, 3:14 PM   If 7PM-7AM, please contact night-coverage

## 2024-05-20 NOTE — Progress Notes (Signed)
 OT Cancellation Note  Patient Details Name: [name] MRN: [id number] DOB: May 13, 1936   Cancelled Treatment:    Reason Eval/Treat Not Completed: Other (comment) (Pt agreed to attempt to work with therapy but had difficulties keeping eyes open. Daughter came into the room and reported pt is a late riser normally in the AM. Will follow up.)  Perri Aragones K OTR/L  Acute Rehab Services  [phone] office number  Warrick Berber 05/20/2024, 7:35 AM

## 2024-05-20 NOTE — Progress Notes (Addendum)
 Bladder scanned patient earlier in shift and it showed 150 m. Just repeated bladder scan for second time per shift and volume 412 ml. Notified J. Blondie, NP. New order to place foley. Will place and continue to monitor.

## 2024-05-20 NOTE — Evaluation (Addendum)
 Occupational Therapy Evaluation Patient Details Name: [name] MRN: [id number] DOB: [birth year] Today's Date: 05/20/2024   History of Present Illness   Pt is an 88 y.o. female who presented due to a fall. She was found to have L  intertrochanteric femur fracture. Pt s/p LLE cephulomedullary nailing on 05/18/24 with WBAT. Pt required 1 unit pRBCS.   PMH: CKD, afib, pacemaker, CHF, HTN, epilepsy, osteopenia, laparoscopic cholecystectomy on 04/23/22.     Clinical Impressions Pt at this time still sleepy and having difficulties keeping eyes open but was more alert from earlier and willing to attempt to participate with daughter in the room. At this time required max assist for supine to sitting and CGA-min assist while sitting at EOB and completed light UE ADLS with LUE as complained about RUE pain and guarded throughout the session. Pt then required max x2 for lateral scoot to HOB and max x2 for sitting to supine. Pt at the start of session had difficulties coordinating LUE to mouth when drinking but noted once they were more alert and cued to keep eyes open they were able to complete. Patient will benefit from continued inpatient follow up therapy, <3 hours/day. Pt and daughter prefers to go to [facility].       If plan is discharge home, recommend the following:   Two people to help with walking and/or transfers;Two people to help with bathing/dressing/bathroom;Assistance with cooking/housework;Assistance with feeding;Direct supervision/assist for medications management;Direct supervision/assist for financial management;Assist for transportation;Help with stairs or ramp for entrance     Functional Status Assessment   Patient has had a recent decline in their functional status and demonstrates the ability to make significant improvements in function in a reasonable and predictable amount of time.     Equipment Recommendations    (TBD)     Recommendations for Other  Services         Precautions/Restrictions   Precautions Precautions: Fall Recall of Precautions/Restrictions: Intact Restrictions Weight Bearing Restrictions Per Provider Order: Yes LLE Weight Bearing Per Provider Order: Weight bearing as tolerated     Mobility Bed Mobility Overal bed mobility: Needs Assistance Bed Mobility: Supine to Sit, Sit to Supine     Supine to sit: Max assist, HOB elevated, Used rails Sit to supine: Max assist, +2 for physical assistance, +2 for safety/equipment, HOB elevated, Used rails   General bed mobility comments: pt needs step by step cues    Transfers Overall transfer level: Needs assistance                 General transfer comment: lateral scoots only with max x2 assist      Balance Overall balance assessment: Needs assistance Sitting-balance support: Feet supported, Bilateral upper extremity supported Sitting balance-Leahy Scale: Poor Sitting balance - Comments: fair-poor as able to complete some light hair care and application of lip stick while sitting at EOB                                   ADL either performed or assessed with clinical judgement   ADL Overall ADL's : Needs assistance/impaired Eating/Feeding: Set up;Minimal assistance;Sitting Eating/Feeding Details (indicate cue type and reason): Pt R handed and only trying to LUE to coordinate to self feed and often closing eyes Grooming: Wash/dry face;Contact guard assist;Sitting   Upper Body Bathing: Maximal assistance;Sitting   Lower Body Bathing: Total assistance;Bed level   Upper Body Dressing : Maximal assistance  Lower Body Dressing: Total assistance                       Vision Baseline Vision/History: 1 Wears glasses Patient Visual Report:  (Per daughter often has decrease in ability to open R eye at baseline)       Perception         Praxis         Pertinent Vitals/Pain Pain Assessment Pain Assessment: Faces Faces  Pain Scale: Hurts whole lot Pain Location: LLE and RUE Pain Descriptors / Indicators: Grimacing, Guarding Pain Intervention(s): Limited activity within patient's tolerance, Monitored during session, Repositioned     Extremity/Trunk Assessment Upper Extremity Assessment Upper Extremity Assessment: RUE deficits/detail;Right hand dominant RUE Deficits / Details: Pt normal AROM at digit and wirst but guarding througohut the session. She did extend L arm to assist in proping self at EOB and walk arm over RUE: Unable to fully assess due to pain RUE Sensation: WNL RUE Coordination: decreased gross motor   Lower Extremity Assessment Lower Extremity Assessment: Defer to PT evaluation   Cervical / Trunk Assessment Cervical / Trunk Assessment: Kyphotic   Communication Communication Communication: No apparent difficulties   Cognition Arousal: Lethargic Behavior During Therapy: WFL for tasks assessed/performed Cognition: No apparent impairments                               Following commands: Intact       Cueing  General Comments   Cueing Techniques: Verbal cues      Exercises     Shoulder Instructions      Home Living Family/patient expects to be discharged to:: Assisted living                             Home Equipment: Rollator (4 wheels)   Additional Comments: [facility]      Prior Functioning/Environment Prior Level of Function : Needs assist             Mobility Comments: Used a 4WW ADLs Comments: Supervision mostly with her showers, but someone is there if she needs assist. Needs assist with getting TED hose on but other than that can do her own dressing.    OT Problem List: Decreased strength;Decreased range of motion;Decreased activity tolerance;Impaired balance (sitting and/or standing);Decreased safety awareness;Decreased knowledge of use of DME or AE;Impaired UE functional use;Pain   OT Treatment/Interventions:  Self-care/ADL training;Therapeutic exercise;DME and/or AE instruction;Therapeutic activities;Patient/family education;Balance training      OT Goals(Current goals can be found in the care plan section)   Acute Rehab OT Goals Patient Stated Goal: to get to [facility] rehab OT Goal Formulation: With patient Time For Goal Achievement: 06/03/24 Potential to Achieve Goals: Fair   OT Frequency:  Min 2X/week    Co-evaluation              AM-PAC OT 6 Clicks Daily Activity     Outcome Measure Help from another person eating meals?: A Little Help from another person taking care of personal grooming?: A Lot Help from another person toileting, which includes using toliet, bedpan, or urinal?: Total Help from another person bathing (including washing, rinsing, drying)?: A Lot Help from another person to put on and taking off regular upper body clothing?: A Lot Help from another person to put on and taking off regular lower body clothing?: Total 6 Click Score: 11  End of Session Nurse Communication: Mobility status  Activity Tolerance: Patient limited by pain Patient left: in bed;with call bell/phone within reach;with bed alarm set;with family/visitor present  OT Visit Diagnosis: Unsteadiness on feet (R26.81);Other abnormalities of gait and mobility (R26.89);Muscle weakness (generalized) (M62.81);Pain Pain - Right/Left: Left Pain - part of body: Leg                Time: 9159-9077 OT Time Calculation (min): 42 min Charges:  OT General Charges $OT Visit: 1 Visit OT Evaluation $OT Eval Moderate Complexity: 1 Mod OT Treatments $Self Care/Home Management : 23-37 mins  Warrick POUR OTR/L  Acute Rehab Services  [phone] office number   Warrick Berber 05/20/2024, 9:33 AM

## 2024-05-20 NOTE — Progress Notes (Signed)
 PROGRESS NOTE    [name]  FMW:[id number] DOB: Oct 29, 1935 DOA: 05/17/2024 PCP: [name], MD    Brief Narrative:   [id number] with h/o stage 3b CKD, afib on on AC due to cerebral AVM, pacemaker, CAD, MCI, and chronic HFpEF who presented on 12/7 with a mechanical fall. She has a left intertrochanteric femur fracture. Head CT with known AVM with surrounding vasogenic edema causing some mass effect on the anterior horn of the L ventricle; neurovascular interventionalist did not think acute intervention was needed. Orthopedics performed ORIF 12/8.  Neurology recommended continuing home Dilantin  and phenobarbital  and adding perioperative Keppra .  Assessment & Plan:   Closed displaced intertrochanteric fracture of left femur (HCC) Status post IM nail by orthopedic on 12/8.  Postop management per orthopedic.  Recommending daily full dose of aspirin , weightbearing as tolerated.  Did require 1 unit of PRBC transfusion.  Cerebral AV malformation Partial symptomatic epilepsy with complex partial seizures, not intractable, without status epilepticus [facility]) Prior provider discussed with Dr. Maude Naegeli of neurovascular intervention, recommended to continue phenobarbital  and Dilantin .  Taper Keppra  over next 5 days  ABLA (acute blood loss anemia) Baseline chronic anemia, Hgb 8.9 on presentation Postop due to some blood loss required 1 unit PRBC transfusion on 12/9  Acute urinary retention Recurrent retention issue during this hospitalization requiring Foley catheter placement.  Will discharge her with a catheter with 1 week urology follow-up.  Chronic diastolic CHF (congestive heart failure) (HCC) MVP (mitral valve prolapse) STAT echo requested by anesthesia related to prior h/o pericardial effusion and pulmonary HTN Echo 12/8 with presented ED and grade 3 diastolic CHF Moderate pulmonary HTN, small pericardial effusion present  Paroxysmal atrial fibrillation (HCC) Pacemaker Appears to  be rate controlled, pacer in place.  Not on anticoagulation candidate  Pressure injury of skin Wound 05/17/24 1948 Pressure Injury Coccyx Right;Left;Medial Stage 1 -  Intact skin with non-blanchable redness of a localized area usually over a bony prominence. (Active)  POA  Hypertension Continue Norvasc , ARB.  IV as needed  Chronic kidney disease, stage 3b (HCC) Creatinine around baseline of 1.3  DNR (do not resuscitate) DNR in place   Consultants: Orthopedics Neurosurgery by telephone only   Procedures: ORIF 12/8   Antibiotics: None  DVT prophylaxis: Place and maintain sequential compression device Start: 05/20/24 0144    Code Status: Do not attempt resuscitation (DNR) PRE-ARREST INTERVENTIONS DESIRED Family Communication:   Status is: Inpatient Remains inpatient appropriate because: Hopefully discharge back to her facility today   PT Follow up Recs:   Subjective:  Seen at bedside.  Little drowsy this morning Daughter present at bedside.  No new focal neurodeficits Right-sided droopy eyelid is intermittent and chronic per daughter.  Not concerned about stroke therefore would like to hold off on any further evaluation including CT and MRI  Examination:  General exam: Appears calm and comfortable  Respiratory system: Clear to auscultation. Respiratory effort normal. Cardiovascular system: S1 & S2 heard, RRR. No JVD, murmurs, rubs, gallops or clicks. No pedal edema. Gastrointestinal system: Abdomen is nondistended, soft and nontender. No organomegaly or masses felt. Normal bowel sounds heard. Central nervous system: Alert and oriented. No focal neurological deficits. Extremities: Symmetric 5 x 5 power.  Limited range of motion of her right elbow due to pain and discomfort Skin: No rashes, lesions or ulcers Psychiatry: Judgement and insight appear normal. Mood & affect appropriate.            Wound 05/17/24 1948 Pressure Injury Coccyx Right;Left;Medial  Stage 1  -  Intact skin with non-blanchable redness of a localized area usually over a bony prominence. (Active)     Diet Orders (From admission, onward)     Start     Ordered   05/18/24 1727  Diet regular Room service appropriate? Yes; Fluid consistency: Thin  Diet effective now       Question Answer Comment  Room service appropriate? Yes   Fluid consistency: Thin      05/18/24 1726            Objective: Vitals:   05/19/24 1407 05/19/24 1709 05/19/24 2147 05/20/24 0557  BP: (!) 117/35 (!) 129/40 (!) 136/38 (!) 130/43  Pulse: 61 60 62 (!) 59  Resp: 16 16 17 15   Temp: 97.7 F (36.5 C) (!) 97.4 F (36.3 C) 97.6 F (36.4 C) 97.9 F (36.6 C)  TempSrc:  Oral Oral Oral  SpO2: 97% 95% 98% 96%  Weight:      Height:        Intake/Output Summary (Last 24 hours) at 05/20/2024 1231 Last data filed at 05/20/2024 0558 Gross per 24 hour  Intake 868 ml  Output 2800 ml  Net -1932 ml   Filed Weights   05/17/24 1125 05/18/24 1236  Weight: 55.3 kg 55 kg    Scheduled Meds:  amLODipine   5 mg Oral Daily   artificial tears  1 drop Both Eyes BID   aspirin   325 mg Oral Daily   Chlorhexidine  Gluconate Cloth  6 each Topical Daily   docusate sodium   100 mg Oral BID   fluticasone   1-2 spray Each Nare Daily   irbesartan   150 mg Oral Daily   levETIRAcetam   500 mg Intravenous BID   Or   levETIRAcetam   500 mg Oral BID   mupirocin  ointment  1 Application Nasal BID   PHENobarbital   64.8 mg Oral QHS   phenytoin   200 mg Oral Daily   sodium chloride  flush  3 mL Intravenous Q12H   tamsulosin   0.4 mg Oral QPC supper   Continuous Infusions:  Nutritional status     Body mass index is 20.18 kg/m.  Data Reviewed:   CBC: Recent Labs  Lab 05/17/24 1314 05/18/24 0325 05/19/24 0339 05/20/24 0358  WBC 9.8 9.5 9.6 7.7  NEUTROABS 8.0*  --   --   --   HGB 8.9* 8.3* 7.8* 8.6*  HCT 27.1* 25.8* 24.1* 25.2*  MCV 103.0* 104.9* 104.8* 96.2  PLT 187 143* 130* 127*   Basic Metabolic  Panel: Recent Labs  Lab 05/17/24 1314 05/18/24 0325 05/19/24 0339 05/20/24 0358  NA 140 139 138 132*  K 4.6 4.2 4.4 4.6  CL 106 106 105 100  CO2 23 23 24 23   GLUCOSE 119* 127* 112* 105*  BUN 46* 44* 45* 51*  CREATININE 1.30* 1.22* 1.43* 1.47*  CALCIUM  9.7 9.6 9.1 8.4*   GFR: Estimated Creatinine Clearance: 23.9 mL/min (A) (by C-G formula based on SCr of 1.47 mg/dL (H)). Liver Function Tests: No results for input(s): AST, ALT, ALKPHOS, BILITOT, PROT, ALBUMIN in the last 168 hours. No results for input(s): LIPASE, AMYLASE in the last 168 hours. No results for input(s): AMMONIA in the last 168 hours. Coagulation Profile: Recent Labs  Lab 05/17/24 1314  INR 1.1   Cardiac Enzymes: No results for input(s): CKTOTAL, CKMB, CKMBINDEX, TROPONINI in the last 168 hours. BNP (last 3 results) No results for input(s): PROBNP in the last 8760 hours. HbA1C: No results for input(s): HGBA1C in  the last 72 hours. CBG: No results for input(s): GLUCAP in the last 168 hours. Lipid Profile: No results for input(s): CHOL, HDL, LDLCALC, TRIG, CHOLHDL, LDLDIRECT in the last 72 hours. Thyroid  Function Tests: No results for input(s): TSH, T4TOTAL, FREET4, T3FREE, THYROIDAB in the last 72 hours. Anemia Panel: No results for input(s): VITAMINB12, FOLATE, FERRITIN, TIBC, IRON , RETICCTPCT in the last 72 hours. Sepsis Labs: No results for input(s): PROCALCITON, LATICACIDVEN in the last 168 hours.  Recent Results (from the past 240 hours)  Surgical PCR screen     Status: Abnormal   Collection Time: 05/17/24  8:14 PM   Specimen: Nasal Mucosa; Nasal Swab  Result Value Ref Range Status   MRSA, PCR NEGATIVE NEGATIVE Final   Staphylococcus aureus POSITIVE (A) NEGATIVE Final    Comment: (NOTE) The Xpert SA Assay (FDA approved for NASAL specimens in patients 40 years of age and older), is one component of a comprehensive surveillance  program. It is not intended to diagnose infection nor to guide or monitor treatment. Performed at [facility], [address], [place], [place] [address]          Radiology Studies: DG FEMUR MIN 2 VIEWS LEFT Result Date: 05/18/2024 CLINICAL DATA:  Left femur fixation EXAM: LEFT FEMUR 2 VIEWS COMPARISON:  Left hip radiograph dated 05/17/2024 FINDINGS: Seven fluoroscopic images obtained during left femur fixation. 2 minutes 10 seconds fluoro time utilized. Radiation dose 6.83 mGy Kerma. Please see performing physicians operative report for full details. IMPRESSION: Fluoroscopic images were obtained for intraoperative guidance of left femur fixation. Electronically Signed   By: Limin  Xu M.D.   On: 05/18/2024 20:31   DG C-Arm 1-60 Min-No Report Result Date: 05/18/2024 Fluoroscopy was utilized by the requesting physician.  No radiographic interpretation.   ECHOCARDIOGRAM COMPLETE Result Date: 05/18/2024    ECHOCARDIOGRAM REPORT   Patient Name:   PEGGYE POON Date of Exam: 05/18/2024 Medical Rec #:  [id number]        Height:       65.0 in Accession #:    [id number]       Weight:       121.3 lb Date of Birth:  05/13/1936        BSA:          1.599 m Patient Age:    88 years         BP:           175/42 mmHg Patient Gender: F                HR:           60 bpm. Exam Location:  Inpatient Procedure: 2D Echo (Both Spectral and Color Flow Doppler were utilized during            procedure). Indications:    PHTN  History:        Patient has prior history of Echocardiogram examinations.                 Pericardial Disease, Pacemaker; Pulmonary HTN.  Sonographer:    Norleen Amour Referring Phys: [id number] JENNIFER YATES IMPRESSIONS  1. Left ventricular ejection fraction, by estimation, is 55 to 60%. Left ventricular ejection fraction by 2D MOD biplane is 59.2 %. The left ventricle has normal function. The left ventricle has no regional wall motion abnormalities. Left ventricular diastolic  parameters are consistent with Grade III diastolic dysfunction (restrictive). Elevated left ventricular end-diastolic pressure.  2. Right ventricular systolic function  is mildly reduced. The right ventricular size is normal. There is moderately elevated pulmonary artery systolic pressure. The estimated right ventricular systolic pressure is 51.8 mmHg.  3. Left atrial size was severely dilated.  4. A small pericardial effusion is present. The pericardial effusion is posterior to the left ventricle.  5. The mitral valve is grossly normal. Trivial mitral valve regurgitation.  6. The aortic valve is tricuspid. Aortic valve regurgitation is not visualized.  7. The inferior vena cava is normal in size with <50% respiratory variability, suggesting right atrial pressure of 8 mmHg. Comparison(s): Changes from prior study are noted. 08/23/2022: LVEF 60-65%, moderate posterior pericardial effusion. FINDINGS  Left Ventricle: Left ventricular ejection fraction, by estimation, is 55 to 60%. Left ventricular ejection fraction by 2D MOD biplane is 59.2 %. The left ventricle has normal function. The left ventricle has no regional wall motion abnormalities. The left ventricular internal cavity size was normal in size. There is no left ventricular hypertrophy. Abnormal (paradoxical) septal motion, consistent with RV pacemaker. Left ventricular diastolic parameters are consistent with Grade III diastolic dysfunction (restrictive). Elevated left ventricular end-diastolic pressure. Right Ventricle: The right ventricular size is normal. No increase in right ventricular wall thickness. Right ventricular systolic function is mildly reduced. There is moderately elevated pulmonary artery systolic pressure. The tricuspid regurgitant velocity is 3.31 m/s, and with an assumed right atrial pressure of 8 mmHg, the estimated right ventricular systolic pressure is 51.8 mmHg. Left Atrium: Left atrial size was severely dilated. Right Atrium: Right  atrial size was normal in size. Pericardium: A small pericardial effusion is present. The pericardial effusion is posterior to the left ventricle. Mitral Valve: The mitral valve is grossly normal. Trivial mitral valve regurgitation. MV peak gradient, 14.6 mmHg. The mean mitral valve gradient is 3.0 mmHg. Tricuspid Valve: The tricuspid valve is grossly normal. Tricuspid valve regurgitation is mild. Aortic Valve: The aortic valve is tricuspid. Aortic valve regurgitation is not visualized. Aortic valve mean gradient measures 6.0 mmHg. Aortic valve peak gradient measures 11.4 mmHg. Aortic valve area, by VTI measures 1.75 cm. Pulmonic Valve: The pulmonic valve was grossly normal. Pulmonic valve regurgitation is trivial. Aorta: The aortic root and ascending aorta are structurally normal, with no evidence of dilitation. Venous: The inferior vena cava is normal in size with less than 50% respiratory variability, suggesting right atrial pressure of 8 mmHg. IAS/Shunts: No atrial level shunt detected by color flow Doppler. Additional Comments: A device lead is visualized.  LEFT VENTRICLE PLAX 2D                        Biplane EF (MOD) LVIDd:         5.50 cm         LV Biplane EF:   Left LVIDs:         4.20 cm                          ventricular LV PW:         0.80 cm                          ejection LV IVS:        0.90 cm                          fraction by LVOT diam:     1.70 cm  2D MOD LV SV:         60                               biplane is LV SV Index:   37                               59.2 %. LVOT Area:     2.27 cm                                Diastology                                LV e' medial:    4.99 cm/s LV Volumes (MOD)               LV E/e' medial:  37.1 LV vol d, MOD    71.7 ml       LV e' lateral:   7.89 cm/s A2C:                           LV E/e' lateral: 23.4 LV vol d, MOD    72.5 ml A4C: LV vol s, MOD    29.6 ml A2C: LV vol s, MOD    29.6 ml A4C: LV SV MOD A2C:   42.1 ml LV  SV MOD A4C:   72.5 ml LV SV MOD BP:    42.9 ml RIGHT VENTRICLE            IVC RV Basal diam:  2.70 cm    IVC diam: 1.60 cm RV S prime:     9.29 cm/s TAPSE (M-mode): 1.7 cm     PULMONARY VEINS                            Diastolic Velocity: 58.00 cm/s                            S/D Velocity:       0.60                            Systolic Velocity:  33.70 cm/s LEFT ATRIUM              Index        RIGHT ATRIUM           Index LA diam:        3.80 cm  2.38 cm/m   RA Area:     14.80 cm LA Vol (A2C):   107.0 ml 66.91 ml/m  RA Volume:   30.50 ml  19.07 ml/m LA Vol (A4C):   91.6 ml  57.28 ml/m LA Biplane Vol: 101.0 ml 63.16 ml/m  AORTIC VALVE                     PULMONIC VALVE AV Area (Vmax):    1.81 cm      PV Vmax:       1.45 m/s AV Area (Vmean):   1.72 cm      PV Peak grad:  8.4 mmHg  AV Area (VTI):     1.75 cm AV Vmax:           169.00 cm/s AV Vmean:          114.000 cm/s AV VTI:            0.343 m AV Peak Grad:      11.4 mmHg AV Mean Grad:      6.0 mmHg LVOT Vmax:         135.00 cm/s LVOT Vmean:        86.500 cm/s LVOT VTI:          0.264 m LVOT/AV VTI ratio: 0.77  AORTA Ao Root diam: 2.80 cm Ao Asc diam:  2.20 cm MITRAL VALVE                TRICUSPID VALVE MV Area (PHT): 4.12 cm     TR Peak grad:   43.8 mmHg MV Area VTI:   1.26 cm     TR Vmax:        331.00 cm/s MV Peak grad:  14.6 mmHg MV Mean grad:  3.0 mmHg     SHUNTS MV Vmax:       1.91 m/s     Systemic VTI:  0.26 m MV Vmean:      73.6 cm/s    Systemic Diam: 1.70 cm MV Decel Time: 184 msec MV E velocity: 185.00 cm/s MV A velocity: 58.90 cm/s MV E/A ratio:  3.14 Vinie Maxcy MD Electronically signed by Vinie Maxcy MD Signature Date/Time: 05/18/2024/2:03:14 PM    Final            LOS: 3 days   Time spent= 35 mins    Burgess JAYSON Dare, MD [facility]  If 7PM-7AM, please contact night-coverage  05/20/2024, 12:31 PM

## 2024-05-20 NOTE — Progress Notes (Addendum)
° °      Overnight   NAME: [name] MRN: [id number] DOB : 06/17/1935    Date of Service   05/20/2024   HPI/Events of Note    Notified by RN for continuing to retain urine.  88 year old history of stage IIIb CKD, A-fib on AC due to cerebral AVM, pacemaker, CAD, MCI, chronic HFpEF  Admitted through ER for mechanical fall. ORIF 05/18/2024  Attending notes acute urinary retention original bladder scan greater than 900 cc, patient was INO cath x 1. Attending notes repeat bladder scan after 4 to 6 hours if ongoing retention will place Foley.  RN states at 0416 hrs. second bladder scan performed showing greater than 412 mL urine retained, therefore will place Foley.   Interventions/ Plan   Place Foley as indicated by attending notation Continue all previous attending orders. Continue all previous specially consult orders.      Lynwood Kipper BSN MSNA MSN ACNPC-AG Acute Care Nurse Practitioner [facility]

## 2024-05-21 ENCOUNTER — Encounter: Payer: Self-pay | Admitting: Adult Health

## 2024-05-21 ENCOUNTER — Non-Acute Institutional Stay (SKILLED_NURSING_FACILITY): Payer: Self-pay | Admitting: Adult Health

## 2024-05-21 DIAGNOSIS — I10 Essential (primary) hypertension: Secondary | ICD-10-CM

## 2024-05-21 DIAGNOSIS — J9601 Acute respiratory failure with hypoxia: Secondary | ICD-10-CM

## 2024-05-21 DIAGNOSIS — S72142A Displaced intertrochanteric fracture of left femur, initial encounter for closed fracture: Secondary | ICD-10-CM

## 2024-05-21 DIAGNOSIS — D62 Acute posthemorrhagic anemia: Secondary | ICD-10-CM

## 2024-05-21 DIAGNOSIS — R338 Other retention of urine: Secondary | ICD-10-CM

## 2024-05-21 DIAGNOSIS — I480 Paroxysmal atrial fibrillation: Secondary | ICD-10-CM | POA: Diagnosis not present

## 2024-05-21 DIAGNOSIS — N1832 Chronic kidney disease, stage 3b: Secondary | ICD-10-CM

## 2024-05-21 DIAGNOSIS — L89301 Pressure ulcer of unspecified buttock, stage 1: Secondary | ICD-10-CM

## 2024-05-21 DIAGNOSIS — Z950 Presence of cardiac pacemaker: Secondary | ICD-10-CM

## 2024-05-21 DIAGNOSIS — D509 Iron deficiency anemia, unspecified: Secondary | ICD-10-CM

## 2024-05-21 DIAGNOSIS — I5032 Chronic diastolic (congestive) heart failure: Secondary | ICD-10-CM

## 2024-05-21 MED ORDER — TORSEMIDE 10 MG PO TABS: 10.0000 mg | ORAL_TABLET | ORAL | Status: AC

## 2024-05-21 NOTE — Progress Notes (Signed)
 Location:  [facility] Illustrator of Service:  SNF (31) Provider:   Bari America, ANP [facility] [phone]   Kelsey Fredia CROME, Little  Patient Care Team: Kelsey Fredia CROME, Little as PCP - General (Internal Medicine) [name], [name] as PCP - Cardiology (Cardiology) [name], Little as Consulting Physician (Neurology)  Extended Emergency Contact Information Primary Emergency Contact: [name],[name] Mobile Phone: [phone] Relation: Daughter Secondary Emergency Contact: [name], Little Mobile Phone: [phone] Relation: Son  Code Status:  DNR Goals of care: Advanced Directive information    05/18/2024   12:27 PM  Advanced Directives  Does Patient Have a Medical Advance Directive? No  Would patient like information on creating a medical advance directive? No - Patient declined     Chief Complaint  Patient presents with   Follow-up    Hospital f/u    HPI:   The patient is an 88 year old with CKD, AFib, cerebral AVM, and chronic congestive heart failure who presents for follow-up after a recent hospitalization.  Left hip fracture and postoperative status - Hospitalized December 7th to December 10th, 2025 for mechanical fall resulting in left intertrochanteric femur fracture - Underwent intramedullary nailing on December 8th - Currently weight bearing as tolerated - Pain controlled with Tylenol  and oxycodone   Seizure disorder and cerebral arteriovenous malformation - History of cerebral arteriovenous malformation and epilepsy with complex partial seizures - During hospitalization, Keppra  was added for prevention and will be tapered off over three days - Continues phenobarbital  and Dilantin  per neurovascular intervention recommendations - CT head on December 7th showed partially calcified mass in anterior left frontal lobe with surrounding vasogenic edema, compatible with arteriovenous malformation causing mass effect in anterior horn of  left lateral ventricle  Chronic kidney disease and hypertension - Chronic kidney disease with baseline creatinine of 1.47 and BUN of 51 - Hypertension controlled with Norvasc , Diovan , and hydralazine   Atrial fibrillation and pacemaker - Atrial fibrillation not anticoagulated due to cerebral arteriovenous malformation - Pacemaker in place and rate controlled  Anemia - Chronic anemia with acute blood loss anemia during hospitalization - Hemoglobin 8.9 on admission, received one unit of packed red blood cells - Hemoglobin 8.6 at discharge  Congestive heart failure - Chronic congestive heart failure - Echocardiogram during hospitalization showed ejection fraction 55-60%, grade three diastolic dysfunction, severely dilated left atrium, and small pericardial effusion  Musculoskeletal and neurological imaging - CT cervical spine showed degenerative changes without acute fracture  MCI Has short term memory loss Slightly confused during visit which is not baseline for her.   Urinary retention  Foley in place Saw urology, on flomax  Voiding trial recommended next week  Noted hx of right unhealed humerus fracture, she has limited mobility in the right arm.  Past Medical History:  Diagnosis Date   Abnormal cardiovascular stress test    Carotid arterial disease    MODERATE RIGHT   Cerebral AV malformation    DDD (degenerative disc disease)    SEVERE WITH SPINAL STENOSIS   Hypercholesterolemia    Hypertension    MVP (mitral valve prolapse)    MILD   Numbness    LEFT FOOT AND ANKLE   Osteopenia    Presence of permanent cardiac pacemaker    Pseudo-gout    Past Surgical History:  Procedure Laterality Date   APPENDECTOMY     BREAST MASS EXCISION     CARPAL TUNNEL RELEASE     CHOLECYSTECTOMY N/A 04/23/2022   Procedure: LAPAROSCOPIC  CHOLECYSTECTOMY;  Surgeon: Vernetta Berg, Little;  Location: [facility] ORS;  Service: General;  Laterality: N/A;   gallbladder     INTRAMEDULLARY (IM) NAIL  INTERTROCHANTERIC Left 05/18/2024   Procedure: INTERTROCHANTERIC FRACTURE FIXATION WITH INTRAMEDULLARY ROD;  Surgeon: Genelle Standing, Little;  Location: [facility] ORS;  Service: Orthopedics;  Laterality: Left;   PACEMAKER IMPLANT N/A 11/07/2021   Procedure: PACEMAKER IMPLANT;  Surgeon: Waddell Danelle ORN, Little;  Location: [facility] INVASIVE CV [facility];  Service: Cardiovascular;  Laterality: N/A;   REMOVAL OF PARATHYROID  GLAND     STRESS NUCLEAR STUDY     ABNORMAL   TONSILLECTOMY      Allergies[1]  Outpatient Encounter Medications as of 05/21/2024  Medication Sig   acetaminophen  (TYLENOL ) 500 MG tablet Take 1 tablet (500 mg total) by mouth every 6 (six) hours as needed for mild pain (pain score 1-3) or headache.   amLODipine  (NORVASC ) 5 MG tablet Take 1 tablet (5 mg total) by mouth daily.   aspirin  325 MG tablet Take 1 tablet (325 mg total) by mouth daily.   b complex vitamins capsule Take 1 capsule by mouth daily.   bisacodyl  (DULCOLAX) 5 MG EC tablet Take 1 tablet (5 mg total) by mouth daily as needed for moderate constipation.   docusate sodium  (COLACE) 100 MG capsule Take 1 capsule (100 mg total) by mouth 2 (two) times daily.   fluticasone  (FLONASE ) 50 MCG/ACT nasal spray Place 1-2 sprays into both nostrils daily.   guaiFENesin -dextromethorphan (ROBITUSSIN DM) 100-10 MG/5ML syrup Take 10 mLs by mouth every 6 (six) hours as needed for cough.   hydrALAZINE  (APRESOLINE ) 10 MG tablet Take 1 tablet (10 mg total) by mouth 2 (two) times daily as needed. Sbp >170   ipratropium-albuterol  (DUONEB) 0.5-2.5 (3) MG/3ML SOLN Take 3 mLs by nebulization every 6 (six) hours as needed (SOB/Wheezing).   Iron , Ferrous Sulfate , 325 (65 Fe) MG TABS Take 325 mg by mouth 3 (three) times a week. (Patient taking differently: Take 325 mg by mouth 3 (three) times a week. On Mon, Wed, Fri)   levETIRAcetam  (KEPPRA ) 250 MG tablet Take 1 tablet (250 mg total) by mouth daily in the afternoon for 3 days.   loperamide  (IMODIUM  A-D) 2 MG tablet Take  2 mg by mouth every 8 (eight) hours as needed for diarrhea or loose stools.   magnesium  oxide (MAG-OX) 400 (240 Mg) MG tablet Take 400 mg by mouth 2 (two) times daily.   Nutritional Supplements (BOOST ORIGINAL PO) Take by mouth as directed. Give 1 can orally one time a day   ondansetron  (ZOFRAN -ODT) 4 MG disintegrating tablet Take 2 mg by mouth every 4 (four) hours as needed for nausea or vomiting.   oxyCODONE  (ROXICODONE ) 5 MG immediate release tablet Take 1 tablet (5 mg total) by mouth every 4 (four) hours as needed for severe pain (pain score 7-10) or breakthrough pain.   PHENobarbital  (LUMINAL) 64.8 MG tablet Take 1 tablet (64.8 mg total) by mouth at bedtime for 7 days.   phenytoin  (DILANTIN ) 100 MG ER capsule Take 2 capsules (200 mg total) by mouth daily for 7 days.   Polyethyl Glycol-Propyl Glycol (SYSTANE) 0.4-0.3 % SOLN Place 1 drop into both eyes in the morning and at bedtime.   tamsulosin  (FLOMAX ) 0.4 MG CAPS capsule Take 1 capsule (0.4 mg total) by mouth daily after supper.   [START ON 05/22/2024] torsemide  (DEMADEX ) 10 MG tablet Take 1 tablet (10 mg total) by mouth 3 (three) times a week.   triamcinolone  cream (KENALOG ) 0.1 %  Apply 1 Application topically 2 (two) times daily as needed.   valsartan  (DIOVAN ) 160 MG tablet Take 1 tablet (160 mg total) by mouth 2 (two) times daily.   [DISCONTINUED] torsemide  (DEMADEX ) 10 MG tablet Take 1 tablet (10 mg total) by mouth 3 (three) times a week. (Patient taking differently: Take 10 mg by mouth 3 (three) times a week. On Mon, Wed, Fri; *Give 1 tablet orally every 24 hours as needed for Edema. Give as needed for 3lbs weight gain in 1 day or 5lbs weight gain in 1 week.*)   No facility-administered encounter medications on file as of 05/21/2024.    Review of Systems  Constitutional:  Positive for activity change and fatigue. Negative for appetite change, chills, diaphoresis and fever.  HENT:  Negative for congestion.   Respiratory:  Negative for  cough, shortness of breath and wheezing.   Cardiovascular:  Negative for chest pain and leg swelling.  Gastrointestinal:  Negative for abdominal distention, abdominal pain, constipation, diarrhea, nausea and vomiting.  Genitourinary:  Positive for difficulty urinating (has foley). Negative for dysuria and urgency.  Musculoskeletal:  Positive for arthralgias and gait problem. Negative for back pain, myalgias and neck pain.  Skin:  Negative for rash.  Neurological:  Negative for dizziness and weakness.  Psychiatric/Behavioral:  Positive for confusion. Negative for agitation and behavioral problems.     Immunization History  Administered Date(s) Administered   Fluad Quad(high Dose 65+) 04/16/2022   Fluad Trivalent(High Dose 65+) 04/02/2023   Fluzone Influenza virus vaccine,trivalent (IIV3), split virus 03/17/2010, 03/26/2011, 04/01/2012, 03/05/2013   Influenza Split 03/28/2009   Influenza, Quadrivalent, Recombinant, Inj, Pf 03/08/2018, 02/13/2019, 02/24/2020   Influenza,inj,Quad PF,6+ Mos 02/25/2014, 03/05/2015   Influenza-Unspecified 02/09/2014, 02/25/2016, 04/06/2017, 04/25/2021   Moderna Covid-19 Vaccine  Bivalent Booster 55yrs & up 04/02/2023, 03/10/2024   Moderna Sars-Covid-2 Vaccination 07/17/2019, 08/14/2019, 02/24/2020, 04/26/2020   Pneumococcal Conjugate-13 11/26/2014   Pneumococcal Polysaccharide-23 06/23/2003, 11/10/2012   Pneumococcal-Unspecified 06/23/2003, 11/10/2012   Td (Adult), 2 Lf Tetanus Toxid, Preservative Free 06/11/2000, 11/06/2011   Td (Adult),5 Lf Tetanus Toxid, Preservative Free 06/11/2000, 11/06/2011   Unspecified SARS-COV-2 Vaccination 06/23/2019, 04/28/2021   Zoster Recombinant(Shingrix) 01/09/2019, 05/08/2019   Zoster, Live 01/15/2006, 01/15/2019   Pertinent  Health Maintenance Due  Topic Date Due   Bone Density Scan  Never done   Influenza Vaccine  01/10/2024      04/08/2023    1:34 PM 06/18/2023    1:50 PM 07/23/2023    9:49 AM 08/05/2023    3:34 PM  08/13/2023   10:30 AM  Fall Risk  Falls in the past year? 0 0 0 0 0  Was there an injury with Fall? 0  0  0  0  0   Fall Risk Category Calculator 0 0 0 0 0  Patient at Risk for Falls Due to  No Fall Risks  No Fall Risks Impaired balance/gait;Impaired mobility  Fall risk Follow up Falls evaluation completed Falls evaluation completed Falls evaluation completed Falls evaluation completed Falls evaluation completed     Data saved with a previous flowsheet row definition   Functional Status Survey:    Vitals:   05/21/24 1149  BP: (!) 147/50  Pulse: 61  Resp: 16  Temp: 98.6 F (37 C)  SpO2: 94%  Weight: 121 lb 4.8 oz (55 kg)   Body mass index is 20.19 kg/m. Physical Exam Vitals and nursing note reviewed.  Constitutional:      General: She is not in acute distress.    Appearance:  She is not diaphoretic.  HENT:     Head: Normocephalic and atraumatic.  Neck:     Vascular: No JVD.  Cardiovascular:     Rate and Rhythm: Normal rate and regular rhythm.     Heart sounds: No murmur heard. Pulmonary:     Effort: Pulmonary effort is normal. No respiratory distress.     Breath sounds: Normal breath sounds. No wheezing.  Abdominal:     General: Bowel sounds are normal. There is no distension.     Palpations: Abdomen is soft.     Tenderness: There is no abdominal tenderness.  Musculoskeletal:        General: Swelling (RUE chronic deformity from prior fx) present.     Right lower leg: No edema.     Left lower leg: Edema (+1) present.  Skin:    General: Skin is warm and dry.  Neurological:     General: No focal deficit present.     Mental Status: She is alert.     Comments: Oriented x 2 Somewhat confused on details of her health  Psychiatric:        Mood and Affect: Mood normal.     Labs reviewed: Recent Labs    05/18/24 0325 05/19/24 0339 05/20/24 0358  NA 139 138 132*  K 4.2 4.4 4.6  CL 106 105 100  CO2 23 24 23   GLUCOSE 127* 112* 105*  BUN 44* 45* 51*  CREATININE  1.22* 1.43* 1.47*  CALCIUM  9.6 9.1 8.4*   Recent Labs    06/13/23 0000 10/18/23 0000 11/14/23 0000  AST 30  --  24  ALT 21  --  14  ALKPHOS 74  --  86  ALBUMIN 3.5 3.2* 3.6   Recent Labs    09/26/23 0448 09/30/23 0000 10/18/23 0000 11/14/23 0000 05/17/24 1314 05/18/24 0325 05/19/24 0339 05/20/24 0358  WBC 9.4   < > 5.8   < > 9.8 9.5 9.6 7.7  NEUTROABS 7.2  --  4.10  --  8.0*  --   --   --   HGB 10.7*   < > 8.0*   < > 8.9* 8.3* 7.8* 8.6*  HCT 30.8*   < > 23*   < > 27.1* 25.8* 24.1* 25.2*  MCV 96.9  --   --   --  103.0* 104.9* 104.8* 96.2  PLT 264   < > 274   < > 187 143* 130* 127*   < > = values in this interval not displayed.   Lab Results  Component Value Date   TSH 4.05 11/14/2023   Lab Results  Component Value Date   HGBA1C 204 06/13/2023   Lab Results  Component Value Date   CHOL 208 (H) 01/28/2022   HDL 97 (A) 06/13/2023   LDLCALC 97 06/13/2023   TRIG 51 06/13/2023   CHOLHDL 2.6 01/28/2022    Significant Diagnostic Results in last 30 days:  DG FEMUR MIN 2 VIEWS LEFT Result Date: 05/18/2024 CLINICAL DATA:  Left femur fixation EXAM: LEFT FEMUR 2 VIEWS COMPARISON:  Left hip radiograph dated 05/17/2024 FINDINGS: Seven fluoroscopic images obtained during left femur fixation. 2 minutes 10 seconds fluoro time utilized. Radiation dose 6.83 mGy Kerma. Please see performing physicians operative report for full details. IMPRESSION: Fluoroscopic images were obtained for intraoperative guidance of left femur fixation. Electronically Signed   By: Limin  Xu M.D.   On: 05/18/2024 20:31   DG C-Arm 1-60 Min-No Report Result Date: 05/18/2024 Fluoroscopy was utilized by  the requesting physician.  No radiographic interpretation.   ECHOCARDIOGRAM COMPLETE Result Date: 05/18/2024    ECHOCARDIOGRAM REPORT   Patient Name:   [name] Date of Exam: 05/18/2024 Medical Rec #:  [id number]        Height:       65.0 in Accession #:    [id number]       Weight:       121.3 lb Date of  Birth:  Oct 09, 1935        BSA:          1.599 m Patient Age:    88 years         BP:           175/42 mmHg Patient Gender: F                HR:           60 bpm. Exam Location:  Inpatient Procedure: 2D Echo (Both Spectral and Color Flow Doppler were utilized during            procedure). Indications:    PHTN  History:        Patient has prior history of Echocardiogram examinations.                 Pericardial Disease, Pacemaker; Pulmonary HTN.  Sonographer:    Norleen Amour Referring Phys: [id number] JENNIFER YATES IMPRESSIONS  1. Left ventricular ejection fraction, by estimation, is 55 to 60%. Left ventricular ejection fraction by 2D MOD biplane is 59.2 %. The left ventricle has normal function. The left ventricle has no regional wall motion abnormalities. Left ventricular diastolic parameters are consistent with Grade III diastolic dysfunction (restrictive). Elevated left ventricular end-diastolic pressure.  2. Right ventricular systolic function is mildly reduced. The right ventricular size is normal. There is moderately elevated pulmonary artery systolic pressure. The estimated right ventricular systolic pressure is 51.8 mmHg.  3. Left atrial size was severely dilated.  4. A small pericardial effusion is present. The pericardial effusion is posterior to the left ventricle.  5. The mitral valve is grossly normal. Trivial mitral valve regurgitation.  6. The aortic valve is tricuspid. Aortic valve regurgitation is not visualized.  7. The inferior vena cava is normal in size with <50% respiratory variability, suggesting right atrial pressure of 8 mmHg. Comparison(s): Changes from prior study are noted. 08/23/2022: LVEF 60-65%, moderate posterior pericardial effusion. FINDINGS  Left Ventricle: Left ventricular ejection fraction, by estimation, is 55 to 60%. Left ventricular ejection fraction by 2D MOD biplane is 59.2 %. The left ventricle has normal function. The left ventricle has no regional wall motion abnormalities. The  left ventricular internal cavity size was normal in size. There is no left ventricular hypertrophy. Abnormal (paradoxical) septal motion, consistent with RV pacemaker. Left ventricular diastolic parameters are consistent with Grade III diastolic dysfunction (restrictive). Elevated left ventricular end-diastolic pressure. Right Ventricle: The right ventricular size is normal. No increase in right ventricular wall thickness. Right ventricular systolic function is mildly reduced. There is moderately elevated pulmonary artery systolic pressure. The tricuspid regurgitant velocity is 3.31 m/s, and with an assumed right atrial pressure of 8 mmHg, the estimated right ventricular systolic pressure is 51.8 mmHg. Left Atrium: Left atrial size was severely dilated. Right Atrium: Right atrial size was normal in size. Pericardium: A small pericardial effusion is present. The pericardial effusion is posterior to the left ventricle. Mitral Valve: The mitral valve is grossly normal. Trivial mitral valve regurgitation. MV peak gradient, 14.6  mmHg. The mean mitral valve gradient is 3.0 mmHg. Tricuspid Valve: The tricuspid valve is grossly normal. Tricuspid valve regurgitation is mild. Aortic Valve: The aortic valve is tricuspid. Aortic valve regurgitation is not visualized. Aortic valve mean gradient measures 6.0 mmHg. Aortic valve peak gradient measures 11.4 mmHg. Aortic valve area, by VTI measures 1.75 cm. Pulmonic Valve: The pulmonic valve was grossly normal. Pulmonic valve regurgitation is trivial. Aorta: The aortic root and ascending aorta are structurally normal, with no evidence of dilitation. Venous: The inferior vena cava is normal in size with less than 50% respiratory variability, suggesting right atrial pressure of 8 mmHg. IAS/Shunts: No atrial level shunt detected by color flow Doppler. Additional Comments: A device lead is visualized.  LEFT VENTRICLE PLAX 2D                        Biplane EF (MOD) LVIDd:         5.50 cm          LV Biplane EF:   Left LVIDs:         4.20 cm                          ventricular LV PW:         0.80 cm                          ejection LV IVS:        0.90 cm                          fraction by LVOT diam:     1.70 cm                          2D MOD LV SV:         60                               biplane is LV SV Index:   37                               59.2 %. LVOT Area:     2.27 cm                                Diastology                                LV e' medial:    4.99 cm/s LV Volumes (MOD)               LV E/e' medial:  37.1 LV vol d, MOD    71.7 ml       LV e' lateral:   7.89 cm/s A2C:                           LV E/e' lateral: 23.4 LV vol d, MOD    72.5 ml A4C: LV vol s, MOD    29.6 ml A2C: LV vol s, MOD    29.6 ml A4C: LV SV MOD A2C:  42.1 ml LV SV MOD A4C:   72.5 ml LV SV MOD BP:    42.9 ml RIGHT VENTRICLE            IVC RV Basal diam:  2.70 cm    IVC diam: 1.60 cm RV S prime:     9.29 cm/s TAPSE (M-mode): 1.7 cm     PULMONARY VEINS                            Diastolic Velocity: 58.00 cm/s                            S/D Velocity:       0.60                            Systolic Velocity:  33.70 cm/s LEFT ATRIUM              Index        RIGHT ATRIUM           Index LA diam:        3.80 cm  2.38 cm/m   RA Area:     14.80 cm LA Vol (A2C):   107.0 ml 66.91 ml/m  RA Volume:   30.50 ml  19.07 ml/m LA Vol (A4C):   91.6 ml  57.28 ml/m LA Biplane Vol: 101.0 ml 63.16 ml/m  AORTIC VALVE                     PULMONIC VALVE AV Area (Vmax):    1.81 cm      PV Vmax:       1.45 m/s AV Area (Vmean):   1.72 cm      PV Peak grad:  8.4 mmHg AV Area (VTI):     1.75 cm AV Vmax:           169.00 cm/s AV Vmean:          114.000 cm/s AV VTI:            0.343 m AV Peak Grad:      11.4 mmHg AV Mean Grad:      6.0 mmHg LVOT Vmax:         135.00 cm/s LVOT Vmean:        86.500 cm/s LVOT VTI:          0.264 m LVOT/AV VTI ratio: 0.77  AORTA Ao Root diam: 2.80 cm Ao Asc diam:  2.20 cm MITRAL VALVE                 TRICUSPID VALVE MV Area (PHT): 4.12 cm     TR Peak grad:   43.8 mmHg MV Area VTI:   1.26 cm     TR Vmax:        331.00 cm/s MV Peak grad:  14.6 mmHg MV Mean grad:  3.0 mmHg     SHUNTS MV Vmax:       1.91 m/s     Systemic VTI:  0.26 m MV Vmean:      73.6 cm/s    Systemic Diam: 1.70 cm MV Decel Time: 184 msec MV E velocity: 185.00 cm/s MV A velocity: 58.90 cm/s MV E/A ratio:  3.14 Vinie Maxcy Little Electronically signed by Vinie Maxcy Little Signature Date/Time: 05/18/2024/2:03:14 PM    Final  CT Cervical Spine Wo Contrast Result Date: 05/17/2024 EXAM: CT CERVICAL SPINE WITHOUT CONTRAST 05/17/2024 12:53:59 PM TECHNIQUE: CT of the cervical spine was performed without the administration of intravenous contrast. Multiplanar reformatted images are provided for review. Automated exposure control, iterative reconstruction, and/or weight based adjustment of the mA/kV was utilized to reduce the radiation dose to as low as reasonably achievable. COMPARISON: CT of the cervical spine dated 10/15/2021. CLINICAL HISTORY: Neck trauma (Age >= 65y). FINDINGS: CERVICAL SPINE: BONES AND ALIGNMENT: Mild reversal of the normal cervical lordosis. No acute fracture or traumatic malalignment. DEGENERATIVE CHANGES: Extensive diffuse degenerative changes throughout the cervical spine. Marked hypertrophic changes involving the atlantoaxial joint, with bone overgrowth and cyst formation within the dens. Moderate central spinal canal stenosis at the craniocervical junction. Posterior endplate ridging and ligamentum flavum thickening and calcification resulting in multilevel moderate-to-severe central spinal canal stenosis, which is greatest at C4-C5. Multilevel neuroforaminal stenosis. SOFT TISSUES: No prevertebral soft tissue swelling. Moderate calcification within the carotid bulbs. Mild biapical pleural scarring. IMPRESSION: 1. No acute abnormality of the cervical spine related to the reported neck trauma. 2. Extensive diffuse degenerative  changes throughout the cervical spine, including marked hypertrophic changes involving the atlantoaxial joint with bone overgrowth and cyst formation within the dens, and multilevel moderate-to-severe central spinal canal stenosis, greatest at C4-5. Electronically signed by: Evalene Coho Little 05/17/2024 01:04 PM EST RP Workstation: [id number]   CT Head Wo Contrast Result Date: 05/17/2024 EXAM: CT HEAD WITHOUT CONTRAST 05/17/2024 12:53:59 PM TECHNIQUE: CT of the head was performed without the administration of intravenous contrast. Automated exposure control, iterative reconstruction, and/or weight based adjustment of the mA/kV was utilized to reduce the radiation dose to as low as reasonably achievable. COMPARISON: CT of the head dated 04/25/2022. CLINICAL HISTORY: Head trauma, moderate-severe. FINDINGS: BRAIN AND VENTRICLES: No acute hemorrhage. No evidence of acute infarct. Partially calcified mass in anterior left frontal lobe with surrounding vasogenic edema remains compatible with arteriovenous malformation. Mass effect on anterior horn of left lateral ventricle. No hydrocephalus. No extra-axial collection. Atherosclerosis of skullbase vasculature without hyperdense vessel or abnormal calcification. ORBITS: No acute abnormality. SINUSES: Mucosal thickening in left maxillary sinus. SOFT TISSUES AND SKULL: No acute soft tissue abnormality. No skull fracture. IMPRESSION: 1. Partially calcified mass in anterior left frontal lobe with surrounding vasogenic edema, compatible with arteriovenous malformation, causing mass effect on anterior horn of left lateral ventricle. 2. Mucosal thickening in left maxillary sinus. Electronically signed by: Evalene Coho Little 05/17/2024 01:00 PM EST RP Workstation: [id number]   DG Hip Unilat With Pelvis 2-3 Views Left Result Date: 05/17/2024 EXAM: 2 or 3 VIEW(S) XRAY OF THE LEFT HIP 05/17/2024 12:36:00 PM COMPARISON: None available. CLINICAL HISTORY: fall FINDINGS: BONES  AND JOINTS: There is an intertrochanteric fracture of the left femur with mild varus angulation. The bony pelvis appears to be intact. No malalignment. SOFT TISSUES: The soft tissues are unremarkable. LUMBAR SPINE: There is diffuse chronic degenerative disc disease throughout the lower lumbar spine and there is levoscoliotic curvature of the lumbar spine. IMPRESSION: 1. Intertrochanteric fracture of the left femur with mild varus angulation. 2. Intact bony pelvis. 3. Diffuse chronic degenerative disc disease throughout the lower lumbar spine and levoscoliotic curvature of the lumbar spine. Electronically signed by: Evalene Coho Little 05/17/2024 12:55 PM EST RP Workstation: [id number]   DG Chest Port 1 View Result Date: 05/17/2024 EXAM: 1 VIEW(S) XRAY OF THE CHEST 05/17/2024 12:37:26 PM COMPARISON: 09/27/2023 CLINICAL HISTORY: fall FINDINGS: LINES, TUBES AND DEVICES: Left chest cardiac pacing  device noted. LUNGS AND PLEURA: Stable left retrocardiac opacity. Stable left pleural effusion. No pneumothorax. HEART AND MEDIASTINUM: Cardiomegaly, unchanged. Atherosclerotic calcifications noted. BONES AND SOFT TISSUES: Severe degenerative changes of left shoulder. IMPRESSION: 1. Stable left retrocardiac opacity and left pleural effusion. 2. Cardiomegaly, unchanged, with left chest cardiac pacing device in place. Electronically signed by: Evalene Coho Little 05/17/2024 12:53 PM EST RP Workstation: [id number]   DG ELBOW COMPLETE RIGHT (3+VIEW) Result Date: 05/17/2024 EXAM: 3 VIEW(S) XRAY OF THE RIGHT ELBOW COMPARISON: None available. CLINICAL HISTORY: [id number] Fall 190176 FINDINGS: BONES AND JOINTS: The bones appear osteopenic. There is mild chondrocalcinosis present within the radioulnar joint. There is enthesophyte formation along the olecranon. No acute fracture. No malalignment. A true lateral view of the elbow was not obtained. SOFT TISSUES: The soft tissues are unremarkable. IMPRESSION: 1. No acute fracture. 2. Mild  chondrocalcinosis within the radioulnar joint. 3. Enthesophyte formation along the olecranon. 4. Osteopenic bones. Electronically signed by: Evalene Coho Little 05/17/2024 12:52 PM EST RP Workstation: [id number]    Assessment/Plan  Left intertrochanteric femur fracture, post-IM nailing Status post IM nailing for fracture. Pain managed with Tylenol  and oxycodone . Weight bearing as tolerated. - Continue aspirin  for DVT prophylaxis. - Added compression hose for DVT prophylaxis. - Continue weight bearing as tolerated. -PT and OT  Chronic diastolic heart failure Echocardiogram showed EF 55-60% with grade 3 diastolic dysfunction. Severely dilated left atrium with small pericardial effusion. - Recommended daily weights for monitoring. -on torsemide   Chronic kidney disease, stage 3 CKD with baseline creatinine 1.47 and BUN 51.  Atrial fibrillation with pacemaker Atrial fibrillation managed with pacemaker. Rate controlled. Not on anticoagulation due to cerebral malformation.  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 Managed with phenobarbital  and Dilantin . Keppra  tapered off during hospitalization. - Continue phenobarbital  and Dilantin .  Chronic anemia with acute blood loss Chronic anemia with acute blood loss. Hemoglobin 8.9 on admission, 8.6 on discharge. Received one unit of packed red blood cells.  Hypertension Low diastolic chronic, avoid aggressive treatment per cardiology Controlled on Norvasc , Diovan , and hydralazine .  Stage 1 pressure injury  Chronic diarrhea Managed with Imodium  as needed. - Continue Imodium  as needed.  Abnormal chest xray Left retrocardiac opacity unchanged Not acute issue found on chest CT 11/08/23 No symptoms Hospital ordered f/u CXR   Family/ staff Communication: discussed with the resident and her son at bedside.   Labs/tests ordered:  CBC BMP 1 week CXR  12/16       [1]  Allergies Allergen Reactions   Aleve [Naproxen] Other (See Comments)   Azithromycin Other (See Comments)    Reaction not recalled   Penicillins Hives and Other (See Comments)    Bad reaction as a child after a shot of Penicillin Tolerated Ancef  for orthopedic procedure 05/18/24   Pravastatin Other (See Comments)    Reaction not recalled   Sulfa Antibiotics Other (See Comments)   Sulfa Drugs Cross Reactors Other (See Comments)    Reaction not recalled- stated she can take it in small doses   Tape Itching and Other (See Comments)    Cannot wear for an extended period of time   Naproxen Sodium Rash

## 2024-05-25 ENCOUNTER — Non-Acute Institutional Stay: Payer: Self-pay | Admitting: Internal Medicine

## 2024-05-25 ENCOUNTER — Encounter: Payer: Self-pay | Admitting: Internal Medicine

## 2024-05-25 DIAGNOSIS — Z950 Presence of cardiac pacemaker: Secondary | ICD-10-CM

## 2024-05-25 DIAGNOSIS — G40209 Localization-related (focal) (partial) symptomatic epilepsy and epileptic syndromes with complex partial seizures, not intractable, without status epilepticus: Secondary | ICD-10-CM | POA: Diagnosis not present

## 2024-05-25 DIAGNOSIS — Z8781 Personal history of (healed) traumatic fracture: Secondary | ICD-10-CM

## 2024-05-25 DIAGNOSIS — I10 Essential (primary) hypertension: Secondary | ICD-10-CM

## 2024-05-25 DIAGNOSIS — N1832 Chronic kidney disease, stage 3b: Secondary | ICD-10-CM | POA: Diagnosis not present

## 2024-05-25 DIAGNOSIS — I480 Paroxysmal atrial fibrillation: Secondary | ICD-10-CM | POA: Diagnosis not present

## 2024-05-25 DIAGNOSIS — R338 Other retention of urine: Secondary | ICD-10-CM | POA: Diagnosis not present

## 2024-05-25 DIAGNOSIS — D509 Iron deficiency anemia, unspecified: Secondary | ICD-10-CM

## 2024-05-25 DIAGNOSIS — I5032 Chronic diastolic (congestive) heart failure: Secondary | ICD-10-CM | POA: Diagnosis not present

## 2024-05-25 DIAGNOSIS — Z9889 Other specified postprocedural states: Secondary | ICD-10-CM | POA: Diagnosis not present

## 2024-05-25 NOTE — Progress Notes (Signed)
 Provider:   Location:  [facility] Illustr[facility] of Service:  SNF ([id number])  PCP: [name], MD Patient Care Team: [name], MD as PCP - General (Internal Medicine) [name], [name], MD as PCP - Cardiology (Cardiology) [name], MD as Consulting Physician (Neurology)  Extended Emergency Contact Information Primary Emergency Contact: [name],[name] Mobile Phone: [phone] Relation: Daughter Secondary Emergency Contact: TRINDA, HARLACHER Mobile Phone: [phone] Relation: Son  Code Status: DNR Goals of Care: Advanced Directive information    05/18/2024   12:27 PM  Advanced Directives  Does Patient Have a Medical Advance Directive? No  Would patient like information on creating a medical advance directive? No - Patient declined      Chief Complaint  Patient presents with   New Admit To SNF    HPI: Patient is a 88 y.o. female seen today for admission to SNF  Patient lives in [place] [name] was admitted in the hospital from 12/7 to 12/10 for left intertrochanteric femur fracture  Per patient she was trying to sit on the couch and missed it and fell.  She was sent to ED where she was found to have a left intertrochanteric fracture Patient also hit her right elbow which has been more painful.  The x-rays of the elbow did not show any fracture. She underwent IM nail by Ortho on 12/8.  She is on a full dose of aspirin  and is WBAT. She had a CT of her head which showed calcified mass in the left frontal lobe compatible with AV malformation with vasogenic edema causing mass effect on anterior horn of left lateral ventricle The neurosurgery did not recommend any intervention She was loaded with Keppra  to prevent seizures.  Postop.  Patient also had anemia postop and was given 1 unit of PRBC  She developed acute urinary retention and now Has a chronic Foley  She also had a cardiac echo preop which showed a EF of 55% Elevated pulmonary artery  pressure and a small pericardial effusion  Patient is doing well in rehab.  She is Product/process Development Scientist dependent.  Pain is controlled with Tylenol .  She is walking with her walker with therapy. Per nurses patient does get confused at night.  Previous History Has history of chronic diarrhea and Previous h/o C Diff colitis S/p PPM placement on 05/30 for CHB  A Fib Amiodarone  tried but then taken off due to GI side effects Not on anticoagulation due to cerebral AVMs.  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 teen Had a work-up at [facility] and has been on Dilantin  since then History of unhealed right humerus shaft fracture Peripheral neuropathy    Past Medical History:  Diagnosis Date   Abnormal cardiovascular stress test    Carotid arterial disease    MODERATE RIGHT   Cerebral AV malformation    DDD (degenerative disc disease)    SEVERE WITH SPINAL STENOSIS   Hypercholesterolemia    Hypertension    MVP (mitral valve prolapse)    MILD   Numbness    LEFT FOOT AND ANKLE   Osteopenia    Presence of permanent cardiac pacemaker    Pseudo-gout    Past Surgical History:  Procedure Laterality Date   APPENDECTOMY     BREAST MASS EXCISION     CARPAL TUNNEL RELEASE     CHOLECYSTECTOMY N/A 04/23/2022   Procedure: LAPAROSCOPIC CHOLECYSTECTOMY;  Surgeon: Vernetta,  Vicenta, MD;  Location: [facility] OR[facility];  Service: General;  Laterality: N/A;   gallbladder     INTRAMEDULLARY (IM) NAIL INTERTROCHANTERIC Left 05/18/2024   Procedure: INTERTROCHANTERIC FRACTURE FIXATION WITH INTRAMEDULLARY ROD;  Surgeon: Genelle Standing, MD;  Location: [facility];  Service: Orthopedics;  Laterality: Left;   PACEMAKER IMPLANT N/A 11/07/2021   Procedure: PACEMAKER IMPLANT;  Surgeon: Waddell Danelle ORN, MD;  Location: [facility];  Service: Cardiovascular;  Laterality: N/A;   REMOVAL OF PARATHYROID  GLAND     STRESS NUCLEAR STUDY     ABNORMAL    TONSILLECTOMY      reports that she quit smoking about 55 years ago. Her smoking use included cigarettes. She started smoking about 69 years ago. She has a 14 pack-year smoking history. She has never used smokeless tobacco. She reports that she does not drink alcohol  and does not use drugs. Social History   Socioeconomic History   Marital status: Widowed    Spouse name: Not on file   Number of children: 3   Years of education: Not on file   Highest education level: Not on file  Occupational History   Not on file  Tobacco Use   Smoking status: Former    Current packs/day: 0.00    Average packs/day: 1 pack/day for 14.0 years (14.0 ttl pk-yrs)    Types: Cigarettes    Start date: 11/08/1954    Quit date: 11/07/1968    Years since quitting: 55.5   Smokeless tobacco: Never  Vaping Use   Vaping status: Never Used  Substance and Sexual Activity   Alcohol  use: No   Drug use: No   Sexual activity: Not on file  Other Topics Concern   Not on file  Social History Narrative   [place] [employer]    Right handed    Social Drivers of Health   Tobacco Use: Medium Risk (05/25/2024)   Patient History    Smoking Tobacco Use: Former    Smokeless Tobacco Use: Never    Passive Exposure: Not on Actuary Strain: Not on file  Food Insecurity: No Food Insecurity (05/17/2024)   Epic    Worried About Programme Researcher, Broadcasting/film/video in the Last Year: Never true    Ran Out of Food in the Last Year: Never true  Transportation Needs: No Transportation Needs (05/17/2024)   Epic    Lack of Transportation (Medical): No    Lack of Transportation (Non-Medical): No  Physical Activity: Not on file  Stress: Not on file  Social Connections: Moderately Integrated (05/17/2024)   Social Connection and Isolation Panel    Frequency of Communication with Friends and Family: Twice a week    Frequency of Social Gatherings with Friends and Family: Twice a week    Attends Religious Services: More than 4  times per year    Active Member of [employer] or Organizations: No    Attends Banker Meetings: 1 to 4 times per year    Marital Status: Widowed  Intimate Partner Violence: Not At Risk (05/17/2024)   Epic    Fear of Current or Ex-Partner: No    Emotionally Abused: No    Physically Abused: No    Sexually Abused: No  Depression (PHQ2-9): Low Risk (08/13/2023)   Depression (PHQ2-9)    PHQ-2 Score: 0  Alcohol  Screen: Low Risk (08/05/2023)   Alcohol  Screen    Last Alcohol  Screening Score (AUDIT): 0  Housing: Low Risk (05/17/2024)   Epic  Unable to Pay for Housing in the Last Year: No    Number of Times Moved in the Last Year: 0    Homeless in the Last Year: No  Utilities: Not At Risk (05/17/2024)   Epic    Threatened with loss of utilities: No  Health Literacy: Not on file    Functional Status Survey:    Family History  Problem Relation Age of Onset   Stroke Mother    Lung cancer Father    Pneumonia Brother    Breast cancer Neg Hx     Health Maintenance  Topic Date Due   Bone Density Scan  Never done   Influenza Vaccine  01/10/2024   COVID-19 Vaccine (7 - 2025-26 season) 02/10/2024   Medicare Annual Wellness (AWV)  08/04/2024   Pneumococcal Vaccine: 50+ Years  Completed   Zoster Vaccines- Shingrix  Completed   Meningococcal B Vaccine  Aged Out   DTaP/Tdap/Td  Discontinued    Allergies[1]  Outpatient Encounter Medications as of 05/25/2024  Medication Sig   acetaminophen  (TYLENOL ) 500 MG tablet Take 1 tablet (500 mg total) by mouth every 6 (six) hours as needed for mild pain (pain score 1-3) or headache.   amLODipine  (NORVASC ) 5 MG tablet Take 1 tablet (5 mg total) by mouth daily.   aspirin  325 MG tablet Take 1 tablet (325 mg total) by mouth daily.   b complex vitamins capsule Take 1 capsule by mouth daily.   bisacodyl  (DULCOLAX) 5 MG EC tablet Take 1 tablet (5 mg total) by mouth daily as needed for moderate constipation.   docusate sodium  (COLACE) 100 MG  capsule Take 1 capsule (100 mg total) by mouth 2 (two) times daily.   fluticasone  (FLONASE ) 50 MCG/ACT nasal spray Place 1-2 sprays into both nostrils daily.   guaiFENesin -dextromethorphan (ROBITUSSIN DM) 100-10 MG/5ML syrup Take 10 mLs by mouth every 6 (six) hours as needed for cough.   hydrALAZINE  (APRESOLINE ) 10 MG tablet Take 1 tablet (10 mg total) by mouth 2 (two) times daily as needed. Sbp >170   ipratropium-albuterol  (DUONEB) 0.5-2.5 (3) MG/3ML SOLN Take 3 mLs by nebulization every 6 (six) hours as needed (SOB/Wheezing).   Iron , Ferrous Sulfate , 325 (65 Fe) MG TABS Take 325 mg by mouth 3 (three) times a week. (Patient taking differently: Take 325 mg by mouth 3 (three) times a week. On Mon, Wed, Fri)   levETIRAcetam  (KEPPRA ) 250 MG tablet Take 1 tablet (250 mg total) by mouth daily in the afternoon for 3 days.   loperamide  (IMODIUM  A-D) 2 MG tablet Take 2 mg by mouth every 8 (eight) hours as needed for diarrhea or loose stools.   magnesium  oxide (MAG-OX) 400 (240 Mg) MG tablet Take 400 mg by mouth 2 (two) times daily.   Nutritional Supplements (BOOST ORIGINAL PO) Take by mouth as directed. Give 1 can orally one time a day   ondansetron  (ZOFRAN -ODT) 4 MG disintegrating tablet Take 2 mg by mouth every 4 (four) hours as needed for nausea or vomiting.   oxyCODONE  (ROXICODONE ) 5 MG immediate release tablet Take 1 tablet (5 mg total) by mouth every 4 (four) hours as needed for severe pain (pain score 7-10) or breakthrough pain.   PHENobarbital  (LUMINAL) 64.8 MG tablet Take 1 tablet (64.8 mg total) by mouth at bedtime for 7 days.   phenytoin  (DILANTIN ) 100 MG ER capsule Take 2 capsules (200 mg total) by mouth daily for 7 days.   Polyethyl Glycol-Propyl Glycol (SYSTANE) 0.4-0.3 % SOLN Place 1 drop  into both eyes in the morning and at bedtime.   tamsulosin  (FLOMAX ) 0.4 MG CAPS capsule Take 1 capsule (0.4 mg total) by mouth daily after supper.   torsemide  (DEMADEX ) 10 MG tablet Take 1 tablet (10 mg  total) by mouth 3 (three) times a week.   triamcinolone  cream (KENALOG ) 0.1 % Apply 1 Application topically 2 (two) times daily as needed.   valsartan  (DIOVAN ) 160 MG tablet Take 1 tablet (160 mg total) by mouth 2 (two) times daily.   No facility-administered encounter medications on file as of 05/25/2024.    Review of Systems  Constitutional:  Positive for activity change. Negative for appetite change.  HENT: Negative.    Respiratory:  Negative for cough and shortness of breath.   Cardiovascular:  Negative for leg swelling.  Gastrointestinal:  Negative for constipation.  Genitourinary:  Positive for difficulty urinating.  Musculoskeletal:  Positive for arthralgias, gait problem and myalgias.  Skin: Negative.   Neurological:  Negative for dizziness and weakness.  Psychiatric/Behavioral:  Positive for confusion. Negative for dysphoric mood and sleep disturbance.     Vitals:   05/25/24 1301  BP: (!) 147/60  Pulse: 60  Resp: 20  Temp: 98.1 F (36.7 C)  Weight: 139 lb 8 oz (63.3 kg)   Body mass index is 23.21 kg/m. Physical Exam Vitals reviewed.  Constitutional:      Appearance: Normal appearance.  HENT:     Head: Normocephalic.     Nose: Nose normal.     Mouth/Throat:     Mouth: Mucous membranes are moist.     Pharynx: Oropharynx is clear.  Eyes:     Pupils: Pupils are equal, round, and reactive to light.  Cardiovascular:     Rate and Rhythm: Normal rate. Rhythm irregular.     Pulses: Normal pulses.     Heart sounds: Normal heart sounds. No murmur heard. Pulmonary:     Effort: Pulmonary effort is normal.     Breath sounds: Normal breath sounds.  Abdominal:     General: Abdomen is flat. Bowel sounds are normal.     Palpations: Abdomen is soft.  Musculoskeletal:        General: No swelling.     Cervical back: Neck supple.  Skin:    General: Skin is warm.  Neurological:     General: No focal deficit present.     Mental Status: She is alert and oriented to person,  place, and time.  Psychiatric:        Mood and Affect: Mood normal.        Thought Content: Thought content normal.     Labs reviewed: Basic Metabolic Panel: Recent Labs    05/18/24 0325 05/19/24 0339 05/20/24 0358  NA 139 138 132*  K 4.2 4.4 4.6  CL 106 105 100  CO2 23 24 23   GLUCOSE 127* 112* 105*  BUN 44* 45* 51*  CREATININE 1.22* 1.43* 1.47*  CALCIUM  9.6 9.1 8.4*   Liver Function Tests: Recent Labs    06/13/23 0000 10/18/23 0000 11/14/23 0000  AST 30  --  24  ALT 21  --  14  ALKPHOS 74  --  86  ALBUMIN 3.5 3.2* 3.6   No results for input(s): LIPASE, AMYLASE in the last 8760 hours. No results for input(s): AMMONIA in the last 8760 hours. CBC: Recent Labs    09/26/23 0448 09/30/23 0000 10/18/23 0000 11/14/23 0000 05/17/24 1314 05/18/24 0325 05/19/24 0339 05/20/24 0358  WBC 9.4   < >  5.8   < > 9.8 9.5 9.6 7.7  NEUTROABS 7.2  --  4.10  --  8.0*  --   --   --   HGB 10.7*   < > 8.0*   < > 8.9* 8.3* 7.8* 8.6*  HCT 30.8*   < > 23*   < > 27.1* 25.8* 24.1* 25.2*  MCV 96.9  --   --   --  103.0* 104.9* 104.8* 96.2  PLT 264   < > 274   < > 187 143* 130* 127*   < > = values in this interval not displayed.   Cardiac Enzymes: No results for input(s): CKTOTAL, CKMB, CKMBINDEX, TROPONINI in the last 8760 hours. BNP: Invalid input(s): POCBNP Lab Results  Component Value Date   HGBA1C 204 06/13/2023   Lab Results  Component Value Date   TSH 4.05 11/14/2023   Lab Results  Component Value Date   VITAMINB12 700 06/20/2023   Lab Results  Component Value Date   FOLATE 38.0 04/22/2022   Lab Results  Component Value Date   IRON  66 08/08/2023   TIBC 192 08/08/2023   FERRITIN 232.7 05/10/2021    Imaging and Procedures obtained prior to SNF admission: DG FEMUR MIN 2 VIEWS LEFT Result Date: 05/18/2024 CLINICAL DATA:  Left femur fixation EXAM: LEFT FEMUR 2 VIEWS COMPARISON:  Left hip radiograph dated 05/17/2024 FINDINGS: Seven fluoroscopic  images obtained during left femur fixation. 2 minutes 10 seconds fluoro time utilized. Radiation dose 6.83 mGy Kerma. Please see performing physicians operative report for full details. IMPRESSION: Fluoroscopic images were obtained for intraoperative guidance of left femur fixation. Electronically Signed   By: Limin  Xu M.D.   On: 05/18/2024 20:31   DG C-Arm 1-60 Min-No Report Result Date: 05/18/2024 Fluoroscopy was utilized by the requesting physician.  No radiographic interpretation.   ECHOCARDIOGRAM COMPLETE Result Date: 05/18/2024    ECHOCARDIOGRAM REPORT   Patient Name:   [name] Date of Exam: 05/18/2024 Medical Rec #:  [id number]        Height:       65.0 in Accession #:    [id number]       Weight:       121.3 lb Date of Birth:  11/23/1935        BSA:          1.599 m Patient Age:    88 years         BP:           175/42 mmHg Patient Gender: F                HR:           60 bpm. Exam Location:  Inpatient Procedure: 2D Echo (Both Spectral and Color Flow Doppler were utilized during            procedure). Indications:    PHTN  History:        Patient has prior history of Echocardiogram examinations.                 Pericardial Disease, Pacemaker; Pulmonary HTN.  Sonographer:    Norleen Amour Referring Phys: [id number] JENNIFER YATES IMPRESSIONS  1. Left ventricular ejection fraction, by estimation, is 55 to 60%. Left ventricular ejection fraction by 2D MOD biplane is 59.2 %. The left ventricle has normal function. The left ventricle has no regional wall motion abnormalities. Left ventricular diastolic parameters are consistent with Grade III diastolic dysfunction (restrictive). Elevated left ventricular  end-diastolic pressure.  2. Right ventricular systolic function is mildly reduced. The right ventricular size is normal. There is moderately elevated pulmonary artery systolic pressure. The estimated right ventricular systolic pressure is 51.8 mmHg.  3. Left atrial size was severely dilated.  4. A small  pericardial effusion is present. The pericardial effusion is posterior to the left ventricle.  5. The mitral valve is grossly normal. Trivial mitral valve regurgitation.  6. The aortic valve is tricuspid. Aortic valve regurgitation is not visualized.  7. The inferior vena cava is normal in size with <50% respiratory variability, suggesting right atrial pressure of 8 mmHg. Comparison(s): Changes from prior study are noted. 08/23/2022: LVEF 60-65%, moderate posterior pericardial effusion. FINDINGS  Left Ventricle: Left ventricular ejection fraction, by estimation, is 55 to 60%. Left ventricular ejection fraction by 2D MOD biplane is 59.2 %. The left ventricle has normal function. The left ventricle has no regional wall motion abnormalities. The left ventricular internal cavity size was normal in size. There is no left ventricular hypertrophy. Abnormal (paradoxical) septal motion, consistent with RV pacemaker. Left ventricular diastolic parameters are consistent with Grade III diastolic dysfunction (restrictive). Elevated left ventricular end-diastolic pressure. Right Ventricle: The right ventricular size is normal. No increase in right ventricular wall thickness. Right ventricular systolic function is mildly reduced. There is moderately elevated pulmonary artery systolic pressure. The tricuspid regurgitant velocity is 3.31 m/s, and with an assumed right atrial pressure of 8 mmHg, the estimated right ventricular systolic pressure is 51.8 mmHg. Left Atrium: Left atrial size was severely dilated. Right Atrium: Right atrial size was normal in size. Pericardium: A small pericardial effusion is present. The pericardial effusion is posterior to the left ventricle. Mitral Valve: The mitral valve is grossly normal. Trivial mitral valve regurgitation. MV peak gradient, 14.6 mmHg. The mean mitral valve gradient is 3.0 mmHg. Tricuspid Valve: The tricuspid valve is grossly normal. Tricuspid valve regurgitation is mild. Aortic  Valve: The aortic valve is tricuspid. Aortic valve regurgitation is not visualized. Aortic valve mean gradient measures 6.0 mmHg. Aortic valve peak gradient measures 11.4 mmHg. Aortic valve area, by VTI measures 1.75 cm. Pulmonic Valve: The pulmonic valve was grossly normal. Pulmonic valve regurgitation is trivial. Aorta: The aortic root and ascending aorta are structurally normal, with no evidence of dilitation. Venous: The inferior vena cava is normal in size with less than 50% respiratory variability, suggesting right atrial pressure of 8 mmHg. IAS/Shunts: No atrial level shunt detected by color flow Doppler. Additional Comments: A device lead is visualized.  LEFT VENTRICLE PLAX 2D                        Biplane EF (MOD) LVIDd:         5.50 cm         LV Biplane EF:   Left LVIDs:         4.20 cm                          ventricular LV PW:         0.80 cm                          ejection LV IVS:        0.90 cm                          fraction by  LVOT diam:     1.70 cm                          2D MOD LV SV:         60                               biplane is LV SV Index:   37                               59.2 %. LVOT Area:     2.27 cm                                Diastology                                LV e' medial:    4.99 cm/s LV Volumes (MOD)               LV E/e' medial:  37.1 LV vol d, MOD    71.7 ml       LV e' lateral:   7.89 cm/s A2C:                           LV E/e' lateral: 23.4 LV vol d, MOD    72.5 ml A4C: LV vol s, MOD    29.6 ml A2C: LV vol s, MOD    29.6 ml A4C: LV SV MOD A2C:   42.1 ml LV SV MOD A4C:   72.5 ml LV SV MOD BP:    42.9 ml RIGHT VENTRICLE            IVC RV Basal diam:  2.70 cm    IVC diam: 1.60 cm RV S prime:     9.29 cm/s TAPSE (M-mode): 1.7 cm     PULMONARY VEINS                            Diastolic Velocity: 58.00 cm/s                            S/D Velocity:       0.60                            Systolic Velocity:  33.70 cm/s LEFT ATRIUM              Index        RIGHT  ATRIUM           Index LA diam:        3.80 cm  2.38 cm/m   RA Area:     14.80 cm LA Vol (A2C):   107.0 ml 66.91 ml/m  RA Volume:   30.50 ml  19.07 ml/m LA Vol (A4C):   91.6 ml  57.28 ml/m LA Biplane Vol: 101.0 ml 63.16 ml/m  AORTIC VALVE                     PULMONIC VALVE AV Area (Vmax):    1.81 cm  PV Vmax:       1.45 m/s AV Area (Vmean):   1.72 cm      PV Peak grad:  8.4 mmHg AV Area (VTI):     1.75 cm AV Vmax:           169.00 cm/s AV Vmean:          114.000 cm/s AV VTI:            0.343 m AV Peak Grad:      11.4 mmHg AV Mean Grad:      6.0 mmHg LVOT Vmax:         135.00 cm/s LVOT Vmean:        86.500 cm/s LVOT VTI:          0.264 m LVOT/AV VTI ratio: 0.77  AORTA Ao Root diam: 2.80 cm Ao Asc diam:  2.20 cm MITRAL VALVE                TRICUSPID VALVE MV Area (PHT): 4.12 cm     TR Peak grad:   43.8 mmHg MV Area VTI:   1.26 cm     TR Vmax:        331.00 cm/s MV Peak grad:  14.6 mmHg MV Mean grad:  3.0 mmHg     SHUNTS MV Vmax:       1.91 m/s     Systemic VTI:  0.26 m MV Vmean:      73.6 cm/s    Systemic Diam: 1.70 cm MV Decel Time: 184 msec MV E velocity: 185.00 cm/s MV A velocity: 58.90 cm/s MV E/A ratio:  3.14 Vinie Maxcy MD Electronically signed by Vinie Maxcy MD Signature Date/Time: 05/18/2024/2:03:14 PM    Final    CT Cervical Spine Wo Contrast Result Date: 05/17/2024 EXAM: CT CERVICAL SPINE WITHOUT CONTRAST 05/17/2024 12:53:59 PM TECHNIQUE: CT of the cervical spine was performed without the administration of intravenous contrast. Multiplanar reformatted images are provided for review. Automated exposure control, iterative reconstruction, and/or weight based adjustment of the mA/kV was utilized to reduce the radiation dose to as low as reasonably achievable. COMPARISON: CT of the cervical spine dated 10/15/2021. CLINICAL HISTORY: Neck trauma (Age >= 65y). FINDINGS: CERVICAL SPINE: BONES AND ALIGNMENT: Mild reversal of the normal cervical lordosis. No acute fracture or traumatic  malalignment. DEGENERATIVE CHANGES: Extensive diffuse degenerative changes throughout the cervical spine. Marked hypertrophic changes involving the atlantoaxial joint, with bone overgrowth and cyst formation within the dens. Moderate central spinal canal stenosis at the craniocervical junction. Posterior endplate ridging and ligamentum flavum thickening and calcification resulting in multilevel moderate-to-severe central spinal canal stenosis, which is greatest at C4-C5. Multilevel neuroforaminal stenosis. SOFT TISSUES: No prevertebral soft tissue swelling. Moderate calcification within the carotid bulbs. Mild biapical pleural scarring. IMPRESSION: 1. No acute abnormality of the cervical spine related to the reported neck trauma. 2. Extensive diffuse degenerative changes throughout the cervical spine, including marked hypertrophic changes involving the atlantoaxial joint with bone overgrowth and cyst formation within the dens, and multilevel moderate-to-severe central spinal canal stenosis, greatest at C4-5. Electronically signed by: Evalene Coho MD 05/17/2024 01:04 PM EST RP Workstation: [id number]   CT Head Wo Contrast Result Date: 05/17/2024 EXAM: CT HEAD WITHOUT CONTRAST 05/17/2024 12:53:59 PM TECHNIQUE: CT of the head was performed without the administration of intravenous contrast. Automated exposure control, iterative reconstruction, and/or weight based adjustment of the mA/kV was utilized to reduce the radiation dose to as low as reasonably achievable. COMPARISON: CT of the head  dated 04/25/2022. CLINICAL HISTORY: Head trauma, moderate-severe. FINDINGS: BRAIN AND VENTRICLES: No acute hemorrhage. No evidence of acute infarct. Partially calcified mass in anterior left frontal lobe with surrounding vasogenic edema remains compatible with arteriovenous malformation. Mass effect on anterior horn of left lateral ventricle. No hydrocephalus. No extra-axial collection. Atherosclerosis of skullbase vasculature  without hyperdense vessel or abnormal calcification. ORBITS: No acute abnormality. SINUSES: Mucosal thickening in left maxillary sinus. SOFT TISSUES AND SKULL: No acute soft tissue abnormality. No skull fracture. IMPRESSION: 1. Partially calcified mass in anterior left frontal lobe with surrounding vasogenic edema, compatible with arteriovenous malformation, causing mass effect on anterior horn of left lateral ventricle. 2. Mucosal thickening in left maxillary sinus. Electronically signed by: Evalene Coho MD 05/17/2024 01:00 PM EST RP Workstation: [id number]   DG Hip Unilat With Pelvis 2-3 Views Left Result Date: 05/17/2024 EXAM: 2 or 3 VIEW(S) XRAY OF THE LEFT HIP 05/17/2024 12:36:00 PM COMPARISON: None available. CLINICAL HISTORY: fall FINDINGS: BONES AND JOINTS: There is an intertrochanteric fracture of the left femur with mild varus angulation. The bony pelvis appears to be intact. No malalignment. SOFT TISSUES: The soft tissues are unremarkable. LUMBAR SPINE: There is diffuse chronic degenerative disc disease throughout the lower lumbar spine and there is levoscoliotic curvature of the lumbar spine. IMPRESSION: 1. Intertrochanteric fracture of the left femur with mild varus angulation. 2. Intact bony pelvis. 3. Diffuse chronic degenerative disc disease throughout the lower lumbar spine and levoscoliotic curvature of the lumbar spine. Electronically signed by: Evalene Coho MD 05/17/2024 12:55 PM EST RP Workstation: [id number]   DG Chest Port 1 View Result Date: 05/17/2024 EXAM: 1 VIEW(S) XRAY OF THE CHEST 05/17/2024 12:37:26 PM COMPARISON: 09/27/2023 CLINICAL HISTORY: fall FINDINGS: LINES, TUBES AND DEVICES: Left chest cardiac pacing device noted. LUNGS AND PLEURA: Stable left retrocardiac opacity. Stable left pleural effusion. No pneumothorax. HEART AND MEDIASTINUM: Cardiomegaly, unchanged. Atherosclerotic calcifications noted. BONES AND SOFT TISSUES: Severe degenerative changes of left shoulder.  IMPRESSION: 1. Stable left retrocardiac opacity and left pleural effusion. 2. Cardiomegaly, unchanged, with left chest cardiac pacing device in place. Electronically signed by: Evalene Coho MD 05/17/2024 12:53 PM EST RP Workstation: [id number]   DG ELBOW COMPLETE RIGHT (3+VIEW) Result Date: 05/17/2024 EXAM: 3 VIEW(S) XRAY OF THE RIGHT ELBOW COMPARISON: None available. CLINICAL HISTORY: [id number] Fall 190176 FINDINGS: BONES AND JOINTS: The bones appear osteopenic. There is mild chondrocalcinosis present within the radioulnar joint. There is enthesophyte formation along the olecranon. No acute fracture. No malalignment. A true lateral view of the elbow was not obtained. SOFT TISSUES: The soft tissues are unremarkable. IMPRESSION: 1. No acute fracture. 2. Mild chondrocalcinosis within the radioulnar joint. 3. Enthesophyte formation along the olecranon. 4. Osteopenic bones. Electronically signed by: Evalene Coho MD 05/17/2024 12:52 PM EST RP Workstation: [id number]    Assessment/Plan 1. S/P ORIF I/M of Left Femur (Primary) WBAT Aspirin  Full dose for DVT prophylaxis Pain seems controlled 2. Acute urinary retention Patient has a Foley catheter She is on Flomax  Plan to do a voiding trial in the facility possible follow-up with urology  3. Paroxysmal atrial fibrillation (HCC) Not on DOAC due to history of AVMs  4. Pacemaker   5. Iron  deficiency anemia, unspecified iron  deficiency anemia type Due to CKD on iron   6. Primary hypertension Patient is on amlodipine  and valsartan   7. Chronic kidney disease, stage 3b (HCC) Creatinine is stable  8. Chronic diastolic CHF (congestive heart failure) (HCC) On Demadex  echo showed EF of 55%  9. Partial symptomatic epilepsy with complex  partial seizures, not intractable, without status epilepticus (HCC) Keppra  is tapered off Continues to be on her baseline dose of Dilantin  and phenobarbital    Family/ staff Communication:   Labs/tests  ordered:CBC pending      [1]  Allergies Allergen Reactions   Aleve [Naproxen] Other (See Comments)   Azithromycin Other (See Comments)    Reaction not recalled   Penicillins Hives and Other (See Comments)    Bad reaction as a child after a shot of Penicillin Tolerated Ancef  for orthopedic procedure 05/18/24   Pravastatin Other (See Comments)    Reaction not recalled   Sulfa Antibiotics Other (See Comments)   Sulfa Drugs Cross Reactors Other (See Comments)    Reaction not recalled- stated she can take it in small doses   Tape Itching and Other (See Comments)    Cannot wear for an extended period of time   Naproxen Sodium Rash

## 2024-05-26 ENCOUNTER — Encounter: Payer: Self-pay | Admitting: Internal Medicine

## 2024-05-28 ENCOUNTER — Ambulatory Visit (HOSPITAL_BASED_OUTPATIENT_CLINIC_OR_DEPARTMENT_OTHER)

## 2024-05-28 ENCOUNTER — Ambulatory Visit (HOSPITAL_BASED_OUTPATIENT_CLINIC_OR_DEPARTMENT_OTHER): Admitting: Student

## 2024-05-28 DIAGNOSIS — S72142A Displaced intertrochanteric fracture of left femur, initial encounter for closed fracture: Secondary | ICD-10-CM

## 2024-05-28 LAB — BASIC METABOLIC PANEL WITH GFR
BUN: 59 — AB (ref 4–21)
CO2: 25 — AB (ref 13–22)
Chloride: 102 (ref 99–108)
Creatinine: 1.2 — AB (ref 0.5–1.1)
Glucose: 106
Potassium: 5.1 meq/L (ref 3.5–5.1)
Sodium: 136 — AB (ref 137–147)

## 2024-05-28 LAB — COMPREHENSIVE METABOLIC PANEL WITH GFR
Calcium: 8.9 (ref 8.7–10.7)
eGFR: 42

## 2024-05-28 NOTE — Progress Notes (Signed)
 Post Operative Evaluation    Procedure/Date of Surgery: Left hip cephalomedullary nailing 05/18/2024  Interval History:   Patient presents today 10 days status post the above procedure for an intertrochanteric hip fracture.  Overall she reports doing well today and that pain is generally well-controlled.  She has been performing limited weightbearing mainly during transfers although is working with physical therapy at her SNF.  Does note continued pain within the right elbow as a result of her fall.  Denies any numbness or tingling.   PMH/PSH/Family History/Social History/Meds/Allergies:    Past Medical History:  Diagnosis Date   Abnormal cardiovascular stress test    Carotid arterial disease    MODERATE RIGHT   Cerebral AV malformation    DDD (degenerative disc disease)    SEVERE WITH SPINAL STENOSIS   Hypercholesterolemia    Hypertension    MVP (mitral valve prolapse)    MILD   Numbness    LEFT FOOT AND ANKLE   Osteopenia    Presence of permanent cardiac pacemaker    Pseudo-gout    Past Surgical History:  Procedure Laterality Date   APPENDECTOMY     BREAST MASS EXCISION     CARPAL TUNNEL RELEASE     CHOLECYSTECTOMY N/A 04/23/2022   Procedure: LAPAROSCOPIC CHOLECYSTECTOMY;  Surgeon: Vernetta Berg, MD;  Location: [facility];  Service: General;  Laterality: N/A;   gallbladder     INTRAMEDULLARY (IM) NAIL INTERTROCHANTERIC Left 05/18/2024   Procedure: INTERTROCHANTERIC FRACTURE FIXATION WITH INTRAMEDULLARY ROD;  Surgeon: Genelle Standing, MD;  Location: [facility];  Service: Orthopedics;  Laterality: Left;   PACEMAKER IMPLANT N/A 11/07/2021   Procedure: PACEMAKER IMPLANT;  Surgeon: Waddell Danelle ORN, MD;  Location: [facility];  Service: Cardiovascular;  Laterality: N/A;   REMOVAL OF PARATHYROID  GLAND     STRESS NUCLEAR STUDY     ABNORMAL   TONSILLECTOMY     Social History   Socioeconomic History   Marital status: Widowed    Spouse  name: Not on file   Number of children: 3   Years of education: Not on file   Highest education level: Not on file  Occupational History   Not on file  Tobacco Use   Smoking status: Former    Current packs/day: 0.00    Average packs/day: 1 pack/day for 14.0 years (14.0 ttl pk-yrs)    Types: Cigarettes    Start date: 11/08/1954    Quit date: 11/07/1968    Years since quitting: 55.5   Smokeless tobacco: Never  Vaping Use   Vaping status: Never Used  Substance and Sexual Activity   Alcohol  use: No   Drug use: No   Sexual activity: Not on file  Other Topics Concern   Not on file  Social History Narrative   [place] [employer]    Right handed    Social Drivers of Health   Tobacco Use: Medium Risk (05/25/2024)   Patient History    Smoking Tobacco Use: Former    Smokeless Tobacco Use: Never    Passive Exposure: Not on Actuary Strain: Not on file  Food Insecurity: No Food Insecurity (05/17/2024)   Epic    Worried About Radiation Protection Practitioner of Food in the Last Year: Never true    Ran Out of Food in the Last  Year: Never true  Transportation Needs: No Transportation Needs (05/17/2024)   Epic    Lack of Transportation (Medical): No    Lack of Transportation (Non-Medical): No  Physical Activity: Not on file  Stress: Not on file  Social Connections: Moderately Integrated (05/17/2024)   Social Connection and Isolation Panel    Frequency of Communication with Friends and Family: Twice a week    Frequency of Social Gatherings with Friends and Family: Twice a week    Attends Religious Services: More than 4 times per year    Active Member of [employer] or Organizations: No    Attends Banker Meetings: 1 to 4 times per year    Marital Status: Widowed  Depression (PHQ2-9): Low Risk (08/13/2023)   Depression (PHQ2-9)    PHQ-2 Score: 0  Alcohol  Screen: Low Risk (08/05/2023)   Alcohol  Screen    Last Alcohol  Screening Score (AUDIT): 0  Housing: Low Risk (05/17/2024)    Epic    Unable to Pay for Housing in the Last Year: No    Number of Times Moved in the Last Year: 0    Homeless in the Last Year: No  Utilities: Not At Risk (05/17/2024)   Epic    Threatened with loss of utilities: No  Health Literacy: Not on file   Family History  Problem Relation Age of Onset   Stroke Mother    Lung cancer Father    Pneumonia Brother    Breast cancer Neg Hx    Allergies[1] Current Outpatient Medications  Medication Sig Dispense Refill   acetaminophen  (TYLENOL ) 500 MG tablet Take 1 tablet (500 mg total) by mouth every 6 (six) hours as needed for mild pain (pain score 1-3) or headache. 30 tablet 0   amLODipine  (NORVASC ) 5 MG tablet Take 1 tablet (5 mg total) by mouth daily.     aspirin  325 MG tablet Take 1 tablet (325 mg total) by mouth daily. 30 tablet 0   b complex vitamins capsule Take 1 capsule by mouth daily.     bisacodyl  (DULCOLAX) 5 MG EC tablet Take 1 tablet (5 mg total) by mouth daily as needed for moderate constipation.     docusate sodium  (COLACE) 100 MG capsule Take 1 capsule (100 mg total) by mouth 2 (two) times daily. 60 capsule 0   fluticasone  (FLONASE ) 50 MCG/ACT nasal spray Place 1-2 sprays into both nostrils daily.     guaiFENesin -dextromethorphan (ROBITUSSIN DM) 100-10 MG/5ML syrup Take 10 mLs by mouth every 6 (six) hours as needed for cough. 118 mL 0   hydrALAZINE  (APRESOLINE ) 10 MG tablet Take 1 tablet (10 mg total) by mouth 2 (two) times daily as needed. Sbp >170     ipratropium-albuterol  (DUONEB) 0.5-2.5 (3) MG/3ML SOLN Take 3 mLs by nebulization every 6 (six) hours as needed (SOB/Wheezing).     Iron , Ferrous Sulfate , 325 (65 Fe) MG TABS Take 325 mg by mouth 3 (three) times a week. (Patient taking differently: Take 325 mg by mouth 3 (three) times a week. On Mon, Wed, Fri)     levETIRAcetam  (KEPPRA ) 250 MG tablet Take 1 tablet (250 mg total) by mouth daily in the afternoon for 3 days.     loperamide  (IMODIUM  A-D) 2 MG tablet Take 2 mg by mouth  every 8 (eight) hours as needed for diarrhea or loose stools.     magnesium  oxide (MAG-OX) 400 (240 Mg) MG tablet Take 400 mg by mouth 2 (two) times daily.     Nutritional Supplements (  BOOST ORIGINAL PO) Take by mouth as directed. Give 1 can orally one time a day     ondansetron  (ZOFRAN -ODT) 4 MG disintegrating tablet Take 2 mg by mouth every 4 (four) hours as needed for nausea or vomiting.     oxyCODONE  (ROXICODONE ) 5 MG immediate release tablet Take 1 tablet (5 mg total) by mouth every 4 (four) hours as needed for severe pain (pain score 7-10) or breakthrough pain. 20 tablet 0   PHENobarbital  (LUMINAL) 64.8 MG tablet Take 1 tablet (64.8 mg total) by mouth at bedtime for 7 days. 30 tablet 5   phenytoin  (DILANTIN ) 100 MG ER capsule Take 2 capsules (200 mg total) by mouth daily for 7 days. 14 capsule 0   Polyethyl Glycol-Propyl Glycol (SYSTANE) 0.4-0.3 % SOLN Place 1 drop into both eyes in the morning and at bedtime.     tamsulosin  (FLOMAX ) 0.4 MG CAPS capsule Take 1 capsule (0.4 mg total) by mouth daily after supper. 30 capsule 0   torsemide  (DEMADEX ) 10 MG tablet Take 1 tablet (10 mg total) by mouth 3 (three) times a week.     triamcinolone  cream (KENALOG ) 0.1 % Apply 1 Application topically 2 (two) times daily as needed.     valsartan  (DIOVAN ) 160 MG tablet Take 1 tablet (160 mg total) by mouth 2 (two) times daily. 60 tablet 0   No current facility-administered medications for this visit.   No results found.  Review of Systems:   A ROS was performed including pertinent positives and negatives as documented in the HPI.   Musculoskeletal Exam:    There were no vitals taken for this visit.  Left hip incisions are well-appearing without evidence of erythema or active drainage.  Staples removal was deferred in the 2 proximal incisions as wound edges are well-approximated but not yet stabilized.  Distal neurosensory and motor exams are intact.  Right elbow demonstrates presence of ecchymosis  lateral to the olecranon.  Imaging:    Xray (left femur 2 views): Status post intramedullary nail which appears in good position without evidence of hardware complication.  Intertrochanteric fracture remains visualized.   I personally reviewed and interpreted the radiographs.   Assessment:   Patient is 10 days status post left hip IM nailing for a displaced intertrochanteric hip fracture sustained during a ground-level fall.  Overall she reports doing well and that she only experiences discomfort in the hip with weightbearing.  She is working with physical therapy at [facility].  Pain is overall well-controlled and patient has more concerns regarding her right elbow today.  This does appear bruised although x-rays reviewed today of the elbow show no evidence of fracture.  Will have patient continue with elbow motion as tolerated.  Plan to see her back in 4 weeks for reassessment.  Plan :    - Continue PT and WBAT with follow-up in 4 weeks      I personally saw and evaluated the patient, and participated in the management and treatment plan.  Leonce Reveal, PA-C Orthopedics    [1]  Allergies Allergen Reactions   Aleve [Naproxen] Other (See Comments)   Azithromycin Other (See Comments)    Reaction not recalled   Penicillins Hives and Other (See Comments)    Bad reaction as a child after a shot of Penicillin Tolerated Ancef  for orthopedic procedure 05/18/24   Pravastatin Other (See Comments)    Reaction not recalled   Sulfa Antibiotics Other (See Comments)   Sulfa Drugs Cross Reactors Other (See Comments)  Reaction not recalled- stated she can take it in small doses   Tape Itching and Other (See Comments)    Cannot wear for an extended period of time   Naproxen Sodium Rash

## 2024-05-29 ENCOUNTER — Non-Acute Institutional Stay (SKILLED_NURSING_FACILITY): Payer: Self-pay | Admitting: Adult Health

## 2024-05-29 ENCOUNTER — Encounter: Payer: Self-pay | Admitting: Adult Health

## 2024-05-29 DIAGNOSIS — I5032 Chronic diastolic (congestive) heart failure: Secondary | ICD-10-CM

## 2024-05-29 DIAGNOSIS — J90 Pleural effusion, not elsewhere classified: Secondary | ICD-10-CM | POA: Diagnosis not present

## 2024-05-29 DIAGNOSIS — R799 Abnormal finding of blood chemistry, unspecified: Secondary | ICD-10-CM

## 2024-05-29 DIAGNOSIS — R410 Disorientation, unspecified: Secondary | ICD-10-CM | POA: Diagnosis not present

## 2024-05-29 MED ORDER — PHENOBARBITAL 64.8 MG PO TABS: 64.8000 mg | ORAL_TABLET | Freq: Every day | ORAL | Status: DC

## 2024-05-29 MED ORDER — PHENYTOIN SODIUM EXTENDED 100 MG PO CAPS: 200.0000 mg | ORAL_CAPSULE | Freq: Every day | ORAL | Status: AC

## 2024-05-29 NOTE — Progress Notes (Signed)
 " Location:  Medical Illustrator of Service:  SNF (31) Provider:   Bari America, ANP [facility] [phone]   Kelsey Fredia CROME, MD  Patient Care Team: Kelsey Fredia CROME, MD as PCP - General (Internal Medicine) [name], [name], MD as PCP - Cardiology (Cardiology) [name], MD as Consulting Physician (Neurology)  Extended Emergency Contact Information Primary Emergency Contact: [name] Mobile Phone: [phone] Relation: Daughter Secondary Emergency Contact: [name] Mobile Phone: [phone] Relation: Son  Code Status:  DNR Goals of care: Advanced Directive information    05/18/2024   12:27 PM  Advanced Directives  Does Patient Have a Medical Advance Directive? No  Would patient like information on creating a medical advance directive? No - Patient declined     Chief Complaint  Patient presents with   Acute Visit    Confusion     HPI:  Pt is a 88 y.o. female seen today for an acute visit for confusion  PMH:  CKD, AFib, cerebral AVM, CHF pacemaker, MCI, anemia, diarrhea, chronic right humerus fx.   Left hip fracture  - Hospitalized December 7th to December 10th, 2025 for mechanical fall resulting in left intertrochanteric femur fracture - Underwent intramedullary nailing on December 8th - Currently weight bearing as tolerated - Pain controlled with Tylenol  and oxycodone   She has a hx of short term memory loss but is now experiencing anxiety and confusion. She is concerned about a wedding for her daughter which is apparently not accurate information. She is typically oriented. Last oxycodone  at 2am this morning. No fever or cough. Normal 02 sats.   She is on flomax . Had urinary retention. Failed voiding trial. Foley in placed. Denies bladder pain, chills, bodyaches, or back pain.  LBM 12/18  Had an abnormal CXR during her hospitalization with a left retrocardiac opacity. This is unchanged and has been seen on CT  before .  Follow up xray ordered by hospital physician showed no change.  05/26/24 CXR IMPRESSION 1 Cardiomegaly 2 Left pleural effusion  Likely associated atelectasis or pneumonia  Noted to have an elevated 05/26/24 BUN 64.1 Cr 1.22 K 5.7 Na 137, recommended to hold valsartan  and torsemide .  Repeat blood work 05/28/24 BUN 59.1 Cr 1.24  K 5.1 Na 136 05/26/24 CBC Hgb 10.1 wbc 6.3 plt 343  Hx of CHF No sob Has leg edema chronically Weight was 121 prior to admit, then went up to 139 now 131. Quite variable.   Seizure disorder and cerebral arteriovenous malformation - History of cerebral arteriovenous malformation and epilepsy with complex partial seizures - CT head on December 7th showed partially calcified mass in anterior left frontal lobe with surrounding vasogenic edema, compatible with arteriovenous malformation causing mass effect in anterior horn of left lateral ventricle   Past Medical History:  Diagnosis Date   Abnormal cardiovascular stress test    Carotid arterial disease    MODERATE RIGHT   Cerebral AV malformation    DDD (degenerative disc disease)    SEVERE WITH SPINAL STENOSIS   Hypercholesterolemia    Hypertension    MVP (mitral valve prolapse)    MILD   Numbness    LEFT FOOT AND ANKLE   Osteopenia    Presence of permanent cardiac pacemaker    Pseudo-gout    Past Surgical History:  Procedure Laterality Date   APPENDECTOMY     BREAST MASS EXCISION     CARPAL TUNNEL RELEASE     CHOLECYSTECTOMY N/A 04/23/2022  Procedure: LAPAROSCOPIC CHOLECYSTECTOMY;  Surgeon: Kelsey Berg, MD;  Location: [facility] ORS;  Service: General;  Laterality: N/A;   gallbladder     INTRAMEDULLARY (IM) NAIL INTERTROCHANTERIC Left 05/18/2024   Procedure: INTERTROCHANTERIC FRACTURE FIXATION WITH INTRAMEDULLARY ROD;  Surgeon: Kelsey Standing, MD;  Location: [facility];  Service: Orthopedics;  Laterality: Left;   PACEMAKER IMPLANT N/A 11/07/2021   Procedure: PACEMAKER IMPLANT;  Surgeon:  Kelsey Danelle ORN, MD;  Location: [facility];  Service: Cardiovascular;  Laterality: N/A;   REMOVAL OF PARATHYROID  GLAND     STRESS NUCLEAR STUDY     ABNORMAL   TONSILLECTOMY      Allergies[1]  Outpatient Encounter Medications as of 05/29/2024  Medication Sig   acetaminophen  (TYLENOL ) 500 MG tablet Take 1 tablet (500 mg total) by mouth every 6 (six) hours as needed for mild pain (pain score 1-3) or headache.   amLODipine  (NORVASC ) 5 MG tablet Take 1 tablet (5 mg total) by mouth daily.   aspirin  325 MG tablet Take 1 tablet (325 mg total) by mouth daily.   b complex vitamins capsule Take 1 capsule by mouth daily.   bisacodyl  (DULCOLAX) 5 MG EC tablet Take 1 tablet (5 mg total) by mouth daily as needed for moderate constipation.   docusate sodium  (COLACE) 100 MG capsule Take 1 capsule (100 mg total) by mouth 2 (two) times daily.   fluticasone  (FLONASE ) 50 MCG/ACT nasal spray Place 1-2 sprays into both nostrils daily.   guaiFENesin -dextromethorphan (ROBITUSSIN DM) 100-10 MG/5ML syrup Take 10 mLs by mouth every 6 (six) hours as needed for cough.   hydrALAZINE  (APRESOLINE ) 10 MG tablet Take 1 tablet (10 mg total) by mouth 2 (two) times daily as needed. Sbp >170   ipratropium-albuterol  (DUONEB) 0.5-2.5 (3) MG/3ML SOLN Take 3 mLs by nebulization every 6 (six) hours as needed (SOB/Wheezing).   Iron , Ferrous Sulfate , 325 (65 Fe) MG TABS Take 325 mg by mouth 3 (three) times a week. (Patient taking differently: Take 325 mg by mouth 3 (three) times a week. On Mon, Wed, Fri)   levETIRAcetam  (KEPPRA ) 250 MG tablet Take 1 tablet (250 mg total) by mouth daily in the afternoon for 3 days.   loperamide  (IMODIUM  A-D) 2 MG tablet Take 2 mg by mouth every 8 (eight) hours as needed for diarrhea or loose stools.   magnesium  oxide (MAG-OX) 400 (240 Mg) MG tablet Take 400 mg by mouth 2 (two) times daily.   Nutritional Supplements (BOOST ORIGINAL PO) Take by mouth as directed. Give 1 can orally one time a day    ondansetron  (ZOFRAN -ODT) 4 MG disintegrating tablet Take 2 mg by mouth every 4 (four) hours as needed for nausea or vomiting.   oxyCODONE  (ROXICODONE ) 5 MG immediate release tablet Take 1 tablet (5 mg total) by mouth every 4 (four) hours as needed for severe pain (pain score 7-10) or breakthrough pain.   PHENobarbital  (LUMINAL) 64.8 MG tablet Take 1 tablet (64.8 mg total) by mouth at bedtime for 7 days.   phenytoin  (DILANTIN ) 100 MG ER capsule Take 2 capsules (200 mg total) by mouth daily for 7 days.   Polyethyl Glycol-Propyl Glycol (SYSTANE) 0.4-0.3 % SOLN Place 1 drop into both eyes in the morning and at bedtime.   tamsulosin  (FLOMAX ) 0.4 MG CAPS capsule Take 1 capsule (0.4 mg total) by mouth daily after supper.   torsemide  (DEMADEX ) 10 MG tablet Take 1 tablet (10 mg total) by mouth 3 (three) times a week.   triamcinolone  cream (KENALOG ) 0.1 %  Apply 1 Application topically 2 (two) times daily as needed.   valsartan  (DIOVAN ) 160 MG tablet Take 1 tablet (160 mg total) by mouth 2 (two) times daily.   No facility-administered encounter medications on file as of 05/29/2024.    Review of Systems  Constitutional:  Positive for activity change, appetite change and fatigue. Negative for chills, diaphoresis and fever.  HENT:  Negative for congestion.   Respiratory:  Negative for cough, shortness of breath and wheezing.   Cardiovascular:  Positive for leg swelling. Negative for chest pain.  Gastrointestinal:  Negative for abdominal distention, abdominal pain, constipation, diarrhea, nausea and vomiting.  Genitourinary:  Positive for difficulty urinating. Negative for dysuria, flank pain, frequency, hematuria, pelvic pain and urgency.  Musculoskeletal:  Positive for arthralgias and gait problem. Negative for back pain, myalgias and neck pain.  Skin:  Negative for rash.  Neurological:  Negative for dizziness and weakness.  Psychiatric/Behavioral:  Positive for confusion. Negative for agitation,  behavioral problems, self-injury and sleep disturbance. The patient is nervous/anxious.     Immunization History  Administered Date(s) Administered   Fluad Quad(high Dose 65+) 04/16/2022   Fluad Trivalent(High Dose 65+) 04/02/2023   Fluzone Influenza virus vaccine,trivalent (IIV3), split virus 03/17/2010, 03/26/2011, 04/01/2012, 03/05/2013   Influenza Split 03/28/2009   Influenza, Quadrivalent, Recombinant, Inj, Pf 03/08/2018, 02/13/2019, 02/24/2020   Influenza,inj,Quad PF,6+ Mos 02/25/2014, 03/05/2015   Influenza-Unspecified 02/09/2014, 02/25/2016, 04/06/2017, 04/25/2021   Moderna Covid-19 Vaccine  Bivalent Booster 73yrs & up 04/02/2023   Moderna Sars-Covid-2 Vaccination 07/17/2019, 08/14/2019, 02/24/2020, 04/26/2020   Pneumococcal Conjugate-13 11/26/2014   Pneumococcal Polysaccharide-23 06/23/2003, 11/10/2012   Pneumococcal-Unspecified 06/23/2003, 11/10/2012   Td (Adult), 2 Lf Tetanus Toxid, Preservative Free 06/11/2000, 11/06/2011   Td (Adult),5 Lf Tetanus Toxid, Preservative Free 06/11/2000, 11/06/2011   Unspecified SARS-COV-2 Vaccination 04/28/2021   Zoster Recombinant(Shingrix) 01/09/2019, 05/08/2019   Zoster, Live 01/15/2006, 01/15/2019   Pertinent  Health Maintenance Due  Topic Date Due   Bone Density Scan  Never done   Influenza Vaccine  01/10/2024      04/08/2023    1:34 PM 06/18/2023    1:50 PM 07/23/2023    9:49 AM 08/05/2023    3:34 PM 08/13/2023   10:30 AM  Fall Risk  Falls in the past year? 0 0 0 0 0  Was there an injury with Fall? 0  0  0  0  0   Fall Risk Category Calculator 0 0 0 0 0  Patient at Risk for Falls Due to  No Fall Risks  No Fall Risks Impaired balance/gait;Impaired mobility  Fall risk Follow up Falls evaluation completed Falls evaluation completed Falls evaluation completed Falls evaluation completed Falls evaluation completed     Data saved with a previous flowsheet row definition   Functional Status Survey:    Vitals:   05/29/24 1249  BP: (!)  149/60  Pulse: 69  Resp: 14  Temp: 98.9 F (37.2 C)  SpO2: 96%  Weight: 131 lb 3.2 oz (59.5 kg)   Body mass index is 21.83 kg/m. Physical Exam Vitals and nursing note reviewed.  Constitutional:      General: She is not in acute distress.    Appearance: She is not diaphoretic.  HENT:     Head: Normocephalic and atraumatic.     Mouth/Throat:     Mouth: Mucous membranes are dry.     Pharynx: Oropharynx is clear.  Eyes:     Conjunctiva/sclera: Conjunctivae normal.     Pupils: Pupils are equal, round, and reactive  to light.  Neck:     Vascular: No JVD.  Cardiovascular:     Rate and Rhythm: Normal rate and regular rhythm.     Heart sounds: No murmur heard. Pulmonary:     Effort: Pulmonary effort is normal. No respiratory distress.     Breath sounds: Normal breath sounds. No wheezing.  Abdominal:     General: Bowel sounds are normal. There is no distension.     Palpations: Abdomen is soft.     Tenderness: There is no abdominal tenderness. There is no right CVA tenderness, left CVA tenderness or guarding.  Musculoskeletal:     Comments: BLE edema +1  Skin:    General: Skin is warm and dry.  Neurological:     Mental Status: She is alert.     Comments: Oriented to self only Able to f/c No focal deficit  Psychiatric:     Comments: Anxious      Labs reviewed: Recent Labs    05/18/24 0325 05/19/24 0339 05/20/24 0358  NA 139 138 132*  K 4.2 4.4 4.6  CL 106 105 100  CO2 23 24 23   GLUCOSE 127* 112* 105*  BUN 44* 45* 51*  CREATININE 1.22* 1.43* 1.47*  CALCIUM  9.6 9.1 8.4*   Recent Labs    06/13/23 0000 10/18/23 0000 11/14/23 0000  AST 30  --  24  ALT 21  --  14  ALKPHOS 74  --  86  ALBUMIN 3.5 3.2* 3.6   Recent Labs    09/26/23 0448 09/30/23 0000 10/18/23 0000 11/14/23 0000 05/17/24 1314 05/18/24 0325 05/19/24 0339 05/20/24 0358  WBC 9.4   < > 5.8   < > 9.8 9.5 9.6 7.7  NEUTROABS 7.2  --  4.10  --  8.0*  --   --   --   HGB 10.7*   < > 8.0*   < >  8.9* 8.3* 7.8* 8.6*  HCT 30.8*   < > 23*   < > 27.1* 25.8* 24.1* 25.2*  MCV 96.9  --   --   --  103.0* 104.9* 104.8* 96.2  PLT 264   < > 274   < > 187 143* 130* 127*   < > = values in this interval not displayed.   Lab Results  Component Value Date   TSH 4.05 11/14/2023   Lab Results  Component Value Date   HGBA1C 204 06/13/2023   Lab Results  Component Value Date   CHOL 208 (H) 01/28/2022   HDL 97 (A) 06/13/2023   LDLCALC 97 06/13/2023   TRIG 51 06/13/2023   CHOLHDL 2.6 01/28/2022    Significant Diagnostic Results in last 30 days:  DG FEMUR MIN 2 VIEWS LEFT Result Date: 05/18/2024 CLINICAL DATA:  Left femur fixation EXAM: LEFT FEMUR 2 VIEWS COMPARISON:  Left hip radiograph dated 05/17/2024 FINDINGS: Seven fluoroscopic images obtained during left femur fixation. 2 minutes 10 seconds fluoro time utilized. Radiation dose 6.83 mGy Kerma. Please see performing physicians operative report for full details. IMPRESSION: Fluoroscopic images were obtained for intraoperative guidance of left femur fixation. Electronically Signed   By: Limin  Xu M.D.   On: 05/18/2024 20:31   DG C-Arm 1-60 Min-No Report Result Date: 05/18/2024 Fluoroscopy was utilized by the requesting physician.  No radiographic interpretation.   ECHOCARDIOGRAM COMPLETE Result Date: 05/18/2024    ECHOCARDIOGRAM REPORT   Patient Name:   [name] Date of Exam: 05/18/2024 Medical Rec #:  [id number]  Height:       65.0 in Accession #:    [id number]       Weight:       121.3 lb Date of Birth:  01-06-1936        BSA:          1.599 m Patient Age:    88 years         BP:           175/42 mmHg Patient Gender: F                HR:           60 bpm. Exam Location:  Inpatient Procedure: 2D Echo (Both Spectral and Color Flow Doppler were utilized during            procedure). Indications:    PHTN  History:        Patient has prior history of Echocardiogram examinations.                 Pericardial Disease, Pacemaker; Pulmonary  HTN.  Sonographer:    Norleen Amour Referring Phys: [id number] JENNIFER YATES IMPRESSIONS  1. Left ventricular ejection fraction, by estimation, is 55 to 60%. Left ventricular ejection fraction by 2D MOD biplane is 59.2 %. The left ventricle has normal function. The left ventricle has no regional wall motion abnormalities. Left ventricular diastolic parameters are consistent with Grade III diastolic dysfunction (restrictive). Elevated left ventricular end-diastolic pressure.  2. Right ventricular systolic function is mildly reduced. The right ventricular size is normal. There is moderately elevated pulmonary artery systolic pressure. The estimated right ventricular systolic pressure is 51.8 mmHg.  3. Left atrial size was severely dilated.  4. A small pericardial effusion is present. The pericardial effusion is posterior to the left ventricle.  5. The mitral valve is grossly normal. Trivial mitral valve regurgitation.  6. The aortic valve is tricuspid. Aortic valve regurgitation is not visualized.  7. The inferior vena cava is normal in size with <50% respiratory variability, suggesting right atrial pressure of 8 mmHg. Comparison(s): Changes from prior study are noted. 08/23/2022: LVEF 60-65%, moderate posterior pericardial effusion. FINDINGS  Left Ventricle: Left ventricular ejection fraction, by estimation, is 55 to 60%. Left ventricular ejection fraction by 2D MOD biplane is 59.2 %. The left ventricle has normal function. The left ventricle has no regional wall motion abnormalities. The left ventricular internal cavity size was normal in size. There is no left ventricular hypertrophy. Abnormal (paradoxical) septal motion, consistent with RV pacemaker. Left ventricular diastolic parameters are consistent with Grade III diastolic dysfunction (restrictive). Elevated left ventricular end-diastolic pressure. Right Ventricle: The right ventricular size is normal. No increase in right ventricular wall thickness. Right  ventricular systolic function is mildly reduced. There is moderately elevated pulmonary artery systolic pressure. The tricuspid regurgitant velocity is 3.31 m/s, and with an assumed right atrial pressure of 8 mmHg, the estimated right ventricular systolic pressure is 51.8 mmHg. Left Atrium: Left atrial size was severely dilated. Right Atrium: Right atrial size was normal in size. Pericardium: A small pericardial effusion is present. The pericardial effusion is posterior to the left ventricle. Mitral Valve: The mitral valve is grossly normal. Trivial mitral valve regurgitation. MV peak gradient, 14.6 mmHg. The mean mitral valve gradient is 3.0 mmHg. Tricuspid Valve: The tricuspid valve is grossly normal. Tricuspid valve regurgitation is mild. Aortic Valve: The aortic valve is tricuspid. Aortic valve regurgitation is not visualized. Aortic valve mean gradient measures 6.0 mmHg. Aortic valve  peak gradient measures 11.4 mmHg. Aortic valve area, by VTI measures 1.75 cm. Pulmonic Valve: The pulmonic valve was grossly normal. Pulmonic valve regurgitation is trivial. Aorta: The aortic root and ascending aorta are structurally normal, with no evidence of dilitation. Venous: The inferior vena cava is normal in size with less than 50% respiratory variability, suggesting right atrial pressure of 8 mmHg. IAS/Shunts: No atrial level shunt detected by color flow Doppler. Additional Comments: A device lead is visualized.  LEFT VENTRICLE PLAX 2D                        Biplane EF (MOD) LVIDd:         5.50 cm         LV Biplane EF:   Left LVIDs:         4.20 cm                          ventricular LV PW:         0.80 cm                          ejection LV IVS:        0.90 cm                          fraction by LVOT diam:     1.70 cm                          2D MOD LV SV:         60                               biplane is LV SV Index:   37                               59.2 %. LVOT Area:     2.27 cm                                 Diastology                                LV e' medial:    4.99 cm/s LV Volumes (MOD)               LV E/e' medial:  37.1 LV vol d, MOD    71.7 ml       LV e' lateral:   7.89 cm/s A2C:                           LV E/e' lateral: 23.4 LV vol d, MOD    72.5 ml A4C: LV vol s, MOD    29.6 ml A2C: LV vol s, MOD    29.6 ml A4C: LV SV MOD A2C:   42.1 ml LV SV MOD A4C:   72.5 ml LV SV MOD BP:    42.9 ml RIGHT VENTRICLE            IVC RV Basal diam:  2.70 cm    IVC diam:  1.60 cm RV S prime:     9.29 cm/s TAPSE (M-mode): 1.7 cm     PULMONARY VEINS                            Diastolic Velocity: 58.00 cm/s                            S/D Velocity:       0.60                            Systolic Velocity:  33.70 cm/s LEFT ATRIUM              Index        RIGHT ATRIUM           Index LA diam:        3.80 cm  2.38 cm/m   RA Area:     14.80 cm LA Vol (A2C):   107.0 ml 66.91 ml/m  RA Volume:   30.50 ml  19.07 ml/m LA Vol (A4C):   91.6 ml  57.28 ml/m LA Biplane Vol: 101.0 ml 63.16 ml/m  AORTIC VALVE                     PULMONIC VALVE AV Area (Vmax):    1.81 cm      PV Vmax:       1.45 m/s AV Area (Vmean):   1.72 cm      PV Peak grad:  8.4 mmHg AV Area (VTI):     1.75 cm AV Vmax:           169.00 cm/s AV Vmean:          114.000 cm/s AV VTI:            0.343 m AV Peak Grad:      11.4 mmHg AV Mean Grad:      6.0 mmHg LVOT Vmax:         135.00 cm/s LVOT Vmean:        86.500 cm/s LVOT VTI:          0.264 m LVOT/AV VTI ratio: 0.77  AORTA Ao Root diam: 2.80 cm Ao Asc diam:  2.20 cm MITRAL VALVE                TRICUSPID VALVE MV Area (PHT): 4.12 cm     TR Peak grad:   43.8 mmHg MV Area VTI:   1.26 cm     TR Vmax:        331.00 cm/s MV Peak grad:  14.6 mmHg MV Mean grad:  3.0 mmHg     SHUNTS MV Vmax:       1.91 m/s     Systemic VTI:  0.26 m MV Vmean:      73.6 cm/s    Systemic Diam: 1.70 cm MV Decel Time: 184 msec MV E velocity: 185.00 cm/s MV A velocity: 58.90 cm/s MV E/A ratio:  3.14 Vinie Maxcy MD Electronically signed by Vinie Maxcy MD Signature Date/Time: 05/18/2024/2:03:14 PM    Final    CT Cervical Spine Wo Contrast Result Date: 05/17/2024 EXAM: CT CERVICAL SPINE WITHOUT CONTRAST 05/17/2024 12:53:59 PM TECHNIQUE: CT of the cervical spine was performed without the administration of intravenous contrast. Multiplanar reformatted images are provided for review. Automated exposure control, iterative reconstruction, and/or  weight based adjustment of the mA/kV was utilized to reduce the radiation dose to as low as reasonably achievable. COMPARISON: CT of the cervical spine dated 10/15/2021. CLINICAL HISTORY: Neck trauma (Age >= 65y). FINDINGS: CERVICAL SPINE: BONES AND ALIGNMENT: Mild reversal of the normal cervical lordosis. No acute fracture or traumatic malalignment. DEGENERATIVE CHANGES: Extensive diffuse degenerative changes throughout the cervical spine. Marked hypertrophic changes involving the atlantoaxial joint, with bone overgrowth and cyst formation within the dens. Moderate central spinal canal stenosis at the craniocervical junction. Posterior endplate ridging and ligamentum flavum thickening and calcification resulting in multilevel moderate-to-severe central spinal canal stenosis, which is greatest at C4-C5. Multilevel neuroforaminal stenosis. SOFT TISSUES: No prevertebral soft tissue swelling. Moderate calcification within the carotid bulbs. Mild biapical pleural scarring. IMPRESSION: 1. No acute abnormality of the cervical spine related to the reported neck trauma. 2. Extensive diffuse degenerative changes throughout the cervical spine, including marked hypertrophic changes involving the atlantoaxial joint with bone overgrowth and cyst formation within the dens, and multilevel moderate-to-severe central spinal canal stenosis, greatest at C4-5. Electronically signed by: Evalene Coho MD 05/17/2024 01:04 PM EST RP Workstation: [id number]   CT Head Wo Contrast Result Date: 05/17/2024 EXAM: CT HEAD WITHOUT CONTRAST  05/17/2024 12:53:59 PM TECHNIQUE: CT of the head was performed without the administration of intravenous contrast. Automated exposure control, iterative reconstruction, and/or weight based adjustment of the mA/kV was utilized to reduce the radiation dose to as low as reasonably achievable. COMPARISON: CT of the head dated 04/25/2022. CLINICAL HISTORY: Head trauma, moderate-severe. FINDINGS: BRAIN AND VENTRICLES: No acute hemorrhage. No evidence of acute infarct. Partially calcified mass in anterior left frontal lobe with surrounding vasogenic edema remains compatible with arteriovenous malformation. Mass effect on anterior horn of left lateral ventricle. No hydrocephalus. No extra-axial collection. Atherosclerosis of skullbase vasculature without hyperdense vessel or abnormal calcification. ORBITS: No acute abnormality. SINUSES: Mucosal thickening in left maxillary sinus. SOFT TISSUES AND SKULL: No acute soft tissue abnormality. No skull fracture. IMPRESSION: 1. Partially calcified mass in anterior left frontal lobe with surrounding vasogenic edema, compatible with arteriovenous malformation, causing mass effect on anterior horn of left lateral ventricle. 2. Mucosal thickening in left maxillary sinus. Electronically signed by: Evalene Coho MD 05/17/2024 01:00 PM EST RP Workstation: [id number]   DG Hip Unilat With Pelvis 2-3 Views Left Result Date: 05/17/2024 EXAM: 2 or 3 VIEW(S) XRAY OF THE LEFT HIP 05/17/2024 12:36:00 PM COMPARISON: None available. CLINICAL HISTORY: fall FINDINGS: BONES AND JOINTS: There is an intertrochanteric fracture of the left femur with mild varus angulation. The bony pelvis appears to be intact. No malalignment. SOFT TISSUES: The soft tissues are unremarkable. LUMBAR SPINE: There is diffuse chronic degenerative disc disease throughout the lower lumbar spine and there is levoscoliotic curvature of the lumbar spine. IMPRESSION: 1. Intertrochanteric fracture of the left femur with mild  varus angulation. 2. Intact bony pelvis. 3. Diffuse chronic degenerative disc disease throughout the lower lumbar spine and levoscoliotic curvature of the lumbar spine. Electronically signed by: Evalene Coho MD 05/17/2024 12:55 PM EST RP Workstation: [id number]   DG Chest Port 1 View Result Date: 05/17/2024 EXAM: 1 VIEW(S) XRAY OF THE CHEST 05/17/2024 12:37:26 PM COMPARISON: 09/27/2023 CLINICAL HISTORY: fall FINDINGS: LINES, TUBES AND DEVICES: Left chest cardiac pacing device noted. LUNGS AND PLEURA: Stable left retrocardiac opacity. Stable left pleural effusion. No pneumothorax. HEART AND MEDIASTINUM: Cardiomegaly, unchanged. Atherosclerotic calcifications noted. BONES AND SOFT TISSUES: Severe degenerative changes of left shoulder. IMPRESSION: 1. Stable left retrocardiac opacity and left pleural effusion. 2.  Cardiomegaly, unchanged, with left chest cardiac pacing device in place. Electronically signed by: Evalene Coho MD 05/17/2024 12:53 PM EST RP Workstation: [id number]   DG ELBOW COMPLETE RIGHT (3+VIEW) Result Date: 05/17/2024 EXAM: 3 VIEW(S) XRAY OF THE RIGHT ELBOW COMPARISON: None available. CLINICAL HISTORY: [id number] Fall [id number] FINDINGS: BONES AND JOINTS: The bones appear osteopenic. There is mild chondrocalcinosis present within the radioulnar joint. There is enthesophyte formation along the olecranon. No acute fracture. No malalignment. A true lateral view of the elbow was not obtained. SOFT TISSUES: The soft tissues are unremarkable. IMPRESSION: 1. No acute fracture. 2. Mild chondrocalcinosis within the radioulnar joint. 3. Enthesophyte formation along the olecranon. 4. Osteopenic bones. Electronically signed by: Evalene Coho MD 05/17/2024 12:52 PM EST RP Workstation: [id number]   CUP PACEART REMOTE DEVICE CHECK Result Date: 05/13/2024 PPM Scheduled remote reviewed. Normal device function.  Presenting rhythm: VP. Next remote transmission per protocol. - CS,  CVRS   Assessment/Plan  1. Acute delirium (Primary) Possibly due to UTI with urinary retention Normal bp and no fever Check UA C and S Push fluids.   2. Elevated BUN Hold torsemide   Recheck BMP 12/22  3. Chronic diastolic CHF (congestive heart failure) (HCC) Not not appear overloaded right now Reduced intake Weight trending down after a trend upward.   4. Small chronic left pleural effusion  Unchanged   Labs/tests ordered:  BMP Monday 12/22 UA C and S today   Addendum 12/2-/25 UA pos for leukocyte esterase, nitrite and blood Failed voiding trial Start Keflex. PCN hx. Notes indicate that she tolerated cephalosporins in the OR for her hips surgery.      [1]  Allergies Allergen Reactions   Aleve [Naproxen] Other (See Comments)   Azithromycin Other (See Comments)    Reaction not recalled   Penicillins Hives and Other (See Comments)    Bad reaction as a child after a shot of Penicillin Tolerated Ancef  for orthopedic procedure 05/18/24   Pravastatin Other (See Comments)    Reaction not recalled   Sulfa Antibiotics Other (See Comments)   Sulfa Drugs Cross Reactors Other (See Comments)    Reaction not recalled- stated she can take it in small doses   Tape Itching and Other (See Comments)    Cannot wear for an extended period of time   Naproxen Sodium Rash   "

## 2024-06-01 LAB — BASIC METABOLIC PANEL WITH GFR
BUN: 57 — AB (ref 4–21)
CO2: 25 — AB (ref 13–22)
Chloride: 100 (ref 99–108)
Creatinine: 1.2 — AB (ref 0.5–1.1)
Glucose: 94
Potassium: 4.9 meq/L (ref 3.5–5.1)
Sodium: 136 — AB (ref 137–147)

## 2024-06-01 LAB — COMPREHENSIVE METABOLIC PANEL WITH GFR
Calcium: 8.6 — AB (ref 8.7–10.7)
eGFR: 45

## 2024-06-05 ENCOUNTER — Other Ambulatory Visit: Payer: Self-pay | Admitting: Adult Health

## 2024-06-05 MED ORDER — OXYCODONE HCL 5 MG PO TABS: 5.0000 mg | ORAL_TABLET | ORAL | 0 refills | Status: AC | PRN

## 2024-06-15 ENCOUNTER — Non-Acute Institutional Stay (SKILLED_NURSING_FACILITY): Payer: Self-pay | Admitting: Adult Health

## 2024-06-15 ENCOUNTER — Encounter: Payer: Self-pay | Admitting: Adult Health

## 2024-06-15 DIAGNOSIS — R338 Other retention of urine: Secondary | ICD-10-CM

## 2024-06-15 DIAGNOSIS — Z950 Presence of cardiac pacemaker: Secondary | ICD-10-CM | POA: Diagnosis not present

## 2024-06-15 DIAGNOSIS — I5032 Chronic diastolic (congestive) heart failure: Secondary | ICD-10-CM | POA: Diagnosis not present

## 2024-06-15 DIAGNOSIS — R413 Other amnesia: Secondary | ICD-10-CM

## 2024-06-15 DIAGNOSIS — Z8781 Personal history of (healed) traumatic fracture: Secondary | ICD-10-CM

## 2024-06-15 DIAGNOSIS — I10 Essential (primary) hypertension: Secondary | ICD-10-CM | POA: Diagnosis not present

## 2024-06-15 DIAGNOSIS — Z9889 Other specified postprocedural states: Secondary | ICD-10-CM

## 2024-06-15 DIAGNOSIS — Q282 Arteriovenous malformation of cerebral vessels: Secondary | ICD-10-CM | POA: Diagnosis not present

## 2024-06-15 DIAGNOSIS — D509 Iron deficiency anemia, unspecified: Secondary | ICD-10-CM

## 2024-06-15 NOTE — Progress Notes (Signed)
 " Location:  [facility] Room Number: 151 P Place of Service:  SNF ([phone]) Provider: Tawni America, NP   Patient Care Team: [name], MD as PCP - General (Internal Medicine) [name], [name], MD as PCP - Cardiology (Cardiology) [name], MD as Consulting Physician (Neurology)  Extended Emergency Contact Information Primary Emergency Contact: [name],[name] Mobile Phone: [phone] Relation: Daughter Secondary Emergency Contact: DORIANA, MAZURKIEWICZ Mobile Phone: [phone] Relation: Son  Code Status:  DNR Goals of care: Advanced Directive information    05/18/2024   12:27 PM  Advanced Directives  Does Patient Have a Medical Advance Directive? No  Would patient like information on creating a medical advance directive? No - Patient declined     Chief Complaint  Patient presents with   Acute Visit    F/ hip fx    HPI:  Pt is a 89 y.o. female seen today for an acute visit for f/u hip fracture.   PMH:  CKD, AFib, cerebral AVM, CHF pacemaker, MCI, anemia, diarrhea, chronic right humerus fx.   Left hip fracture  - Hospitalized December 7th to December 10th, 2025 for mechanical fall resulting in left intertrochanteric femur fracture - Underwent intramedullary nailing on December 8th - Currently weight bearing as tolerated - denies any pain. Reports that she is frustrated that she has not made more progress. She has a new great great grandbaby. She is using a stand up lift for transfer. Has  a wound on her coccyx area being treated with Allevyn  Treated for Keflex for a UTI with foley in place on 05/12/24 grew >100K of Ecoli She was experiencing Delirium which improved.   Continues with short term memory loss  She saw urology 06/15/2024 and they recommended continuing the foley for now due to her lack of mobility and wound. Failed removal x 2.   Seizure disorder and cerebral arteriovenous malformation - History of cerebral  arteriovenous malformation and epilepsy with complex partial seizures -CT head on December 7th showed partially calcified mass in anterior left frontal lobe with surrounding vasogenic edema, compatible with arteriovenous malformation causing mass effect in anterior horn of left lateral ventricle     CHF diastolic EF55%: has gained 5 lbs in the past 10 days. No sob. Has chronic leg edema.  Pacemaker , hx of syncope.  Hx of anemia Diarrhea Afib not a candidate for eliquis  due to cerebral malformation  HTN with low diastolic : hydralazine  increased to BID 06/07/24 Peripheral neuropathy  Past Medical History:  Diagnosis Date   Abnormal cardiovascular stress test    Carotid arterial disease    MODERATE RIGHT   Cerebral AV malformation    DDD (degenerative disc disease)    SEVERE WITH SPINAL STENOSIS   Hypercholesterolemia    Hypertension    MVP (mitral valve prolapse)    MILD   Numbness    LEFT FOOT AND ANKLE   Osteopenia    Presence of permanent cardiac pacemaker    Pseudo-gout    Past Surgical History:  Procedure Laterality Date   APPENDECTOMY     BREAST MASS EXCISION     CARPAL TUNNEL RELEASE     CHOLECYSTECTOMY N/A 04/23/2022   Procedure: LAPAROSCOPIC CHOLECYSTECTOMY;  Surgeon: Vernetta Berg, MD;  Location: [facility];  Service: General;  Laterality: N/A;   gallbladder     INTRAMEDULLARY (IM) NAIL INTERTROCHANTERIC Left 05/18/2024   Procedure: INTERTROCHANTERIC FRACTURE FIXATION WITH INTRAMEDULLARY ROD;  Surgeon: Genelle Standing, MD;  Location: [facility];  Service: Orthopedics;  Laterality: Left;   PACEMAKER IMPLANT N/A 11/07/2021   Procedure: PACEMAKER IMPLANT;  Surgeon: Waddell Danelle ORN, MD;  Location: [facility];  Service: Cardiovascular;  Laterality: N/A;   REMOVAL OF PARATHYROID  GLAND     STRESS NUCLEAR STUDY     ABNORMAL   TONSILLECTOMY      Allergies[1]  Outpatient Encounter Medications as of 06/15/2024  Medication Sig   acetaminophen  (TYLENOL ) 500 MG  tablet Take 1 tablet (500 mg total) by mouth every 6 (six) hours as needed for mild pain (pain score 1-3) or headache.   amLODipine  (NORVASC ) 5 MG tablet Take 1 tablet (5 mg total) by mouth daily.   aspirin  325 MG tablet Take 1 tablet (325 mg total) by mouth daily.   b complex vitamins capsule Take 1 capsule by mouth daily.   bisacodyl  (DULCOLAX) 5 MG EC tablet Take 1 tablet (5 mg total) by mouth daily as needed for moderate constipation.   carbamide peroxide (DEBROX) 6.5 % OTIC solution Place 3 drops into both ears as directed. Instill 3 drop in both ears at bedtime for wax removal. until 06/17/2024 23:59 QHS in both ears, then perform ear lavage with warm water  and peroxide the following morning   docusate sodium  (COLACE) 100 MG capsule Take 1 capsule (100 mg total) by mouth 2 (two) times daily.   fluticasone  (FLONASE ) 50 MCG/ACT nasal spray Place 1-2 sprays into both nostrils daily.   furosemide  (LASIX ) 20 MG tablet Take 20 mg by mouth. Give 20 mg by mouth every 24 hours as needed for 5lb weight gain   guaiFENesin -dextromethorphan (ROBITUSSIN DM) 100-10 MG/5ML syrup Take 10 mLs by mouth every 6 (six) hours as needed for cough.   hydrALAZINE  (APRESOLINE ) 10 MG tablet Take 1 tablet (10 mg total) by mouth 2 (two) times daily as needed. Sbp >170   ipratropium-albuterol  (DUONEB) 0.5-2.5 (3) MG/3ML SOLN Take 3 mLs by nebulization every 6 (six) hours as needed (SOB/Wheezing).   Iron , Ferrous Sulfate , 325 (65 Fe) MG TABS Take 325 mg by mouth 3 (three) times a week.   loperamide  (IMODIUM  A-D) 2 MG tablet Take 2 mg by mouth every 8 (eight) hours as needed for diarrhea or loose stools.   magnesium  oxide (MAG-OX) 400 (240 Mg) MG tablet Take 400 mg by mouth 2 (two) times daily.   Nutritional Supplements (BOOST ORIGINAL PO) Take by mouth as directed. Give 1 can orally one two times a day   ondansetron  (ZOFRAN -ODT) 4 MG disintegrating tablet Take 2 mg by mouth every 4 (four) hours as needed for nausea or  vomiting.   oxyCODONE  (ROXICODONE ) 5 MG immediate release tablet Take 1 tablet (5 mg total) by mouth every 4 (four) hours as needed for severe pain (pain score 7-10) or breakthrough pain.   PHENobarbital  (LUMINAL) 64.8 MG tablet Take 1 tablet (64.8 mg total) by mouth at bedtime.   phenytoin  (DILANTIN ) 100 MG ER capsule Take 2 capsules (200 mg total) by mouth daily.   Polyethyl Glycol-Propyl Glycol (SYSTANE) 0.4-0.3 % SOLN Place 1 drop into both eyes in the morning and at bedtime.   saccharomyces boulardii (FLORASTOR) 250 MG capsule Take 250 mg by mouth 2 (two) times daily. Give 1 capsule by mouth two times a day for Probiotic for 14 Days   tamsulosin  (FLOMAX ) 0.4 MG CAPS capsule Take 1 capsule (0.4 mg total) by mouth daily after supper.   torsemide  (DEMADEX ) 10 MG tablet Take 1 tablet (10 mg total) by mouth 3 (three)  times a week.   triamcinolone  cream (KENALOG ) 0.1 % Apply 1 Application topically 2 (two) times daily as needed.   valsartan  (DIOVAN ) 160 MG tablet Take 1 tablet (160 mg total) by mouth 2 (two) times daily.   No facility-administered encounter medications on file as of 06/15/2024.    Review of Systems  Constitutional:  Positive for activity change. Negative for appetite change, chills, diaphoresis, fatigue and fever.  HENT:  Negative for congestion.   Respiratory:  Negative for cough, shortness of breath and wheezing.   Cardiovascular:  Positive for leg swelling. Negative for chest pain.  Gastrointestinal:  Negative for abdominal distention, abdominal pain, constipation, diarrhea, nausea and vomiting.  Genitourinary:  Negative for difficulty urinating, dysuria and urgency.       Has foley  Musculoskeletal:  Positive for gait problem. Negative for back pain, myalgias and neck pain.  Skin:  Negative for rash.  Neurological:  Negative for dizziness and weakness.  Psychiatric/Behavioral:  Positive for dysphoric mood. Negative for confusion.        Short term memory loss     Immunization History  Administered Date(s) Administered   Fluad Quad(high Dose 65+) 04/16/2022   Fluad Trivalent(High Dose 65+) 04/02/2023   Fluzone Influenza virus vaccine,trivalent (IIV3), split virus 03/17/2010, 03/26/2011, 04/01/2012, 03/05/2013   Influenza Split 03/28/2009   Influenza, Quadrivalent, Recombinant, Inj, Pf 03/08/2018, 02/13/2019, 02/24/2020   Influenza,inj,Quad PF,6+ Mos 02/25/2014, 03/05/2015   Influenza-Unspecified 02/09/2014, 02/25/2016, 04/06/2017, 04/25/2021   Moderna Covid-19 Vaccine  Bivalent Booster 63yrs & up 04/02/2023   Moderna Sars-Covid-2 Vaccination 07/17/2019, 08/14/2019, 02/24/2020, 04/26/2020   Pneumococcal Conjugate-13 11/26/2014   Pneumococcal Polysaccharide-23 06/23/2003, 11/10/2012   Pneumococcal-Unspecified 06/23/2003, 11/10/2012   Td (Adult), 2 Lf Tetanus Toxid, Preservative Free 06/11/2000, 11/06/2011   Td (Adult),5 Lf Tetanus Toxid, Preservative Free 06/11/2000, 11/06/2011   Unspecified SARS-COV-2 Vaccination 04/28/2021   Zoster Recombinant(Shingrix) 01/09/2019, 05/08/2019   Zoster, Live 01/15/2006, 01/15/2019   Pertinent  Health Maintenance Due  Topic Date Due   Bone Density Scan  Never done   Influenza Vaccine  01/10/2024      04/08/2023    1:34 PM 06/18/2023    1:50 PM 07/23/2023    9:49 AM 08/05/2023    3:34 PM 08/13/2023   10:30 AM  Fall Risk  Falls in the past year? 0 0 0 0 0  Was there an injury with Fall? 0  0  0  0  0   Fall Risk Category Calculator 0 0 0 0 0  Patient at Risk for Falls Due to  No Fall Risks  No Fall Risks Impaired balance/gait;Impaired mobility  Fall risk Follow up Falls evaluation completed Falls evaluation completed Falls evaluation completed Falls evaluation completed Falls evaluation completed     Data saved with a previous flowsheet row definition   Functional Status Survey:    Vitals:   06/15/24 0955 06/15/24 1307  BP: (!) 179/57 (!) 142/50  Pulse: 60   Resp: 16   Temp: 98.2 F (36.8 C)    SpO2: 95%   Weight: 134 lb (60.8 kg)   Height: 5' 5 (1.651 m)    Body mass index is 22.3 kg/m. Wt Readings from Last 3 Encounters:  06/15/24 134 lb (60.8 kg)  05/29/24 131 lb 3.2 oz (59.5 kg)  05/21/24 121 lb 4.8 oz (55 kg)    Physical Exam Vitals and nursing note reviewed.  Constitutional:      General: She is not in acute distress.    Appearance: She is not  diaphoretic.  HENT:     Head: Normocephalic and atraumatic.  Neck:     Vascular: No JVD.  Cardiovascular:     Rate and Rhythm: Normal rate and regular rhythm.     Heart sounds: No murmur heard. Pulmonary:     Effort: Pulmonary effort is normal. No respiratory distress.     Breath sounds: Normal breath sounds. No wheezing.  Abdominal:     General: Bowel sounds are normal. There is no distension.     Palpations: Abdomen is soft.     Tenderness: There is no abdominal tenderness.  Genitourinary:    Comments: Foley with clear yellow urine Musculoskeletal:     Right lower leg: Edema present.     Left lower leg: Edema present.  Skin:    General: Skin is warm and dry.  Neurological:     Mental Status: She is alert and oriented to person, place, and time.     Labs reviewed: Recent Labs    05/18/24 0325 05/19/24 0339 05/20/24 0358 05/28/24 0000 06/01/24 0000  NA 139 138 132* 136* 136*  K 4.2 4.4 4.6 5.1 4.9  CL 106 105 100 102 100  CO2 23 24 23  25* 25*  GLUCOSE 127* 112* 105*  --   --   BUN 44* 45* 51* 59* 57*  CREATININE 1.22* 1.43* 1.47* 1.2* 1.2*  CALCIUM  9.6 9.1 8.4* 8.9 8.6*   Recent Labs    10/18/23 0000 11/14/23 0000  AST  --  24  ALT  --  14  ALKPHOS  --  86  ALBUMIN 3.2* 3.6   Recent Labs    09/26/23 0448 09/30/23 0000 10/18/23 0000 11/14/23 0000 05/17/24 1314 05/18/24 0325 05/19/24 0339 05/20/24 0358  WBC 9.4   < > 5.8   < > 9.8 9.5 9.6 7.7  NEUTROABS 7.2  --  4.10  --  8.0*  --   --   --   HGB 10.7*   < > 8.0*   < > 8.9* 8.3* 7.8* 8.6*  HCT 30.8*   < > 23*   < > 27.1* 25.8*  24.1* 25.2*  MCV 96.9  --   --   --  103.0* 104.9* 104.8* 96.2  PLT 264   < > 274   < > 187 143* 130* 127*   < > = values in this interval not displayed.   Lab Results  Component Value Date   TSH 4.05 11/14/2023   Lab Results  Component Value Date   HGBA1C 204 06/13/2023   Lab Results  Component Value Date   CHOL 208 (H) 01/28/2022   HDL 97 (A) 06/13/2023   LDLCALC 97 06/13/2023   TRIG 51 06/13/2023   CHOLHDL 2.6 01/28/2022    Significant Diagnostic Results in last 30 days:  DG FEMUR MIN 2 VIEWS LEFT Result Date: 06/14/2024 EXAM: 2 VIEW(S) XRAY OF THE LEFT FEMUR 05/28/2024 10:52:00 AM COMPARISON: None available. CLINICAL HISTORY: fracture FINDINGS: BONES AND JOINTS: Left femoral intramedullary nail fixation in place. Hardware is intact. No periprosthetic lucency. Left femoral intratrochanteric fracture is in near-anatomic alignment. SOFT TISSUES: Extensive chondrocalcinosis  seen within the left knee joint. . IMPRESSION: 1. Uncomplicated left femur fixation hardware . Left intratrochanteric femur fracture in near anatomic alignment. Electronically signed by: Aliene Lloyd MD 06/14/2024 06:42 AM EST RP Workstation: [id number] [id number] FEMUR MIN 2 VIEWS LEFT Result Date: 05/18/2024 CLINICAL DATA:  Left femur fixation EXAM: LEFT FEMUR 2 VIEWS COMPARISON:  Left hip radiograph dated  05/17/2024 FINDINGS: Seven fluoroscopic images obtained during left femur fixation. 2 minutes 10 seconds fluoro time utilized. Radiation dose 6.83 mGy Kerma. Please see performing physicians operative report for full details. IMPRESSION: Fluoroscopic images were obtained for intraoperative guidance of left femur fixation. Electronically Signed   By: Limin  Xu M.D.   On: 05/18/2024 20:31   DG C-Arm 1-60 Min-No Report Result Date: 05/18/2024 Fluoroscopy was utilized by the requesting physician.  No radiographic interpretation.   ECHOCARDIOGRAM COMPLETE Result Date: 05/18/2024    ECHOCARDIOGRAM REPORT   Patient Name:    [name] Date of Exam: 05/18/2024 Medical Rec #:  [id number]        Height:       65.0 in Accession #:    [id number]       Weight:       121.3 lb Date of Birth:  1935-06-14        BSA:          1.599 m Patient Age:    88 years         BP:           175/42 mmHg Patient Gender: F                HR:           60 bpm. Exam Location:  Inpatient Procedure: 2D Echo (Both Spectral and Color Flow Doppler were utilized during            procedure). Indications:    PHTN  History:        Patient has prior history of Echocardiogram examinations.                 Pericardial Disease, Pacemaker; Pulmonary HTN.  Sonographer:    Norleen Amour Referring Phys: [id number] JENNIFER YATES IMPRESSIONS  1. Left ventricular ejection fraction, by estimation, is 55 to 60%. Left ventricular ejection fraction by 2D MOD biplane is 59.2 %. The left ventricle has normal function. The left ventricle has no regional wall motion abnormalities. Left ventricular diastolic parameters are consistent with Grade III diastolic dysfunction (restrictive). Elevated left ventricular end-diastolic pressure.  2. Right ventricular systolic function is mildly reduced. The right ventricular size is normal. There is moderately elevated pulmonary artery systolic pressure. The estimated right ventricular systolic pressure is 51.8 mmHg.  3. Left atrial size was severely dilated.  4. A small pericardial effusion is present. The pericardial effusion is posterior to the left ventricle.  5. The mitral valve is grossly normal. Trivial mitral valve regurgitation.  6. The aortic valve is tricuspid. Aortic valve regurgitation is not visualized.  7. The inferior vena cava is normal in size with <50% respiratory variability, suggesting right atrial pressure of 8 mmHg. Comparison(s): Changes from prior study are noted. 08/23/2022: LVEF 60-65%, moderate posterior pericardial effusion. FINDINGS  Left Ventricle: Left ventricular ejection fraction, by estimation, is 55 to 60%. Left  ventricular ejection fraction by 2D MOD biplane is 59.2 %. The left ventricle has normal function. The left ventricle has no regional wall motion abnormalities. The left ventricular internal cavity size was normal in size. There is no left ventricular hypertrophy. Abnormal (paradoxical) septal motion, consistent with RV pacemaker. Left ventricular diastolic parameters are consistent with Grade III diastolic dysfunction (restrictive). Elevated left ventricular end-diastolic pressure. Right Ventricle: The right ventricular size is normal. No increase in right ventricular wall thickness. Right ventricular systolic function is mildly reduced. There is moderately elevated pulmonary artery systolic pressure. The tricuspid regurgitant velocity  is 3.31 m/s, and with an assumed right atrial pressure of 8 mmHg, the estimated right ventricular systolic pressure is 51.8 mmHg. Left Atrium: Left atrial size was severely dilated. Right Atrium: Right atrial size was normal in size. Pericardium: A small pericardial effusion is present. The pericardial effusion is posterior to the left ventricle. Mitral Valve: The mitral valve is grossly normal. Trivial mitral valve regurgitation. MV peak gradient, 14.6 mmHg. The mean mitral valve gradient is 3.0 mmHg. Tricuspid Valve: The tricuspid valve is grossly normal. Tricuspid valve regurgitation is mild. Aortic Valve: The aortic valve is tricuspid. Aortic valve regurgitation is not visualized. Aortic valve mean gradient measures 6.0 mmHg. Aortic valve peak gradient measures 11.4 mmHg. Aortic valve area, by VTI measures 1.75 cm. Pulmonic Valve: The pulmonic valve was grossly normal. Pulmonic valve regurgitation is trivial. Aorta: The aortic root and ascending aorta are structurally normal, with no evidence of dilitation. Venous: The inferior vena cava is normal in size with less than 50% respiratory variability, suggesting right atrial pressure of 8 mmHg. IAS/Shunts: No atrial level shunt  detected by color flow Doppler. Additional Comments: A device lead is visualized.  LEFT VENTRICLE PLAX 2D                        Biplane EF (MOD) LVIDd:         5.50 cm         LV Biplane EF:   Left LVIDs:         4.20 cm                          ventricular LV PW:         0.80 cm                          ejection LV IVS:        0.90 cm                          fraction by LVOT diam:     1.70 cm                          2D MOD LV SV:         60                               biplane is LV SV Index:   37                               59.2 %. LVOT Area:     2.27 cm                                Diastology                                LV e' medial:    4.99 cm/s LV Volumes (MOD)               LV E/e' medial:  37.1 LV vol d, MOD    71.7 ml       LV e' lateral:  7.89 cm/s A2C:                           LV E/e' lateral: 23.4 LV vol d, MOD    72.5 ml A4C: LV vol s, MOD    29.6 ml A2C: LV vol s, MOD    29.6 ml A4C: LV SV MOD A2C:   42.1 ml LV SV MOD A4C:   72.5 ml LV SV MOD BP:    42.9 ml RIGHT VENTRICLE            IVC RV Basal diam:  2.70 cm    IVC diam: 1.60 cm RV S prime:     9.29 cm/s TAPSE (M-mode): 1.7 cm     PULMONARY VEINS                            Diastolic Velocity: 58.00 cm/s                            S/D Velocity:       0.60                            Systolic Velocity:  33.70 cm/s LEFT ATRIUM              Index        RIGHT ATRIUM           Index LA diam:        3.80 cm  2.38 cm/m   RA Area:     14.80 cm LA Vol (A2C):   107.0 ml 66.91 ml/m  RA Volume:   30.50 ml  19.07 ml/m LA Vol (A4C):   91.6 ml  57.28 ml/m LA Biplane Vol: 101.0 ml 63.16 ml/m  AORTIC VALVE                     PULMONIC VALVE AV Area (Vmax):    1.81 cm      PV Vmax:       1.45 m/s AV Area (Vmean):   1.72 cm      PV Peak grad:  8.4 mmHg AV Area (VTI):     1.75 cm AV Vmax:           169.00 cm/s AV Vmean:          114.000 cm/s AV VTI:            0.343 m AV Peak Grad:      11.4 mmHg AV Mean Grad:      6.0 mmHg LVOT Vmax:          135.00 cm/s LVOT Vmean:        86.500 cm/s LVOT VTI:          0.264 m LVOT/AV VTI ratio: 0.77  AORTA Ao Root diam: 2.80 cm Ao Asc diam:  2.20 cm MITRAL VALVE                TRICUSPID VALVE MV Area (PHT): 4.12 cm     TR Peak grad:   43.8 mmHg MV Area VTI:   1.26 cm     TR Vmax:        331.00 cm/s MV Peak grad:  14.6 mmHg MV Mean grad:  3.0 mmHg     SHUNTS MV Vmax:  1.91 m/s     Systemic VTI:  0.26 m MV Vmean:      73.6 cm/s    Systemic Diam: 1.70 cm MV Decel Time: 184 msec MV E velocity: 185.00 cm/s MV A velocity: 58.90 cm/s MV E/A ratio:  3.14 Vinie Maxcy MD Electronically signed by Vinie Maxcy MD Signature Date/Time: 05/18/2024/2:03:14 PM    Final    CT Cervical Spine Wo Contrast Result Date: 05/17/2024 EXAM: CT CERVICAL SPINE WITHOUT CONTRAST 05/17/2024 12:53:59 PM TECHNIQUE: CT of the cervical spine was performed without the administration of intravenous contrast. Multiplanar reformatted images are provided for review. Automated exposure control, iterative reconstruction, and/or weight based adjustment of the mA/kV was utilized to reduce the radiation dose to as low as reasonably achievable. COMPARISON: CT of the cervical spine dated 10/15/2021. CLINICAL HISTORY: Neck trauma (Age >= 65y). FINDINGS: CERVICAL SPINE: BONES AND ALIGNMENT: Mild reversal of the normal cervical lordosis. No acute fracture or traumatic malalignment. DEGENERATIVE CHANGES: Extensive diffuse degenerative changes throughout the cervical spine. Marked hypertrophic changes involving the atlantoaxial joint, with bone overgrowth and cyst formation within the dens. Moderate central spinal canal stenosis at the craniocervical junction. Posterior endplate ridging and ligamentum flavum thickening and calcification resulting in multilevel moderate-to-severe central spinal canal stenosis, which is greatest at C4-C5. Multilevel neuroforaminal stenosis. SOFT TISSUES: No prevertebral soft tissue swelling. Moderate calcification within the  carotid bulbs. Mild biapical pleural scarring. IMPRESSION: 1. No acute abnormality of the cervical spine related to the reported neck trauma. 2. Extensive diffuse degenerative changes throughout the cervical spine, including marked hypertrophic changes involving the atlantoaxial joint with bone overgrowth and cyst formation within the dens, and multilevel moderate-to-severe central spinal canal stenosis, greatest at C4-5. Electronically signed by: Evalene Coho MD 05/17/2024 01:04 PM EST RP Workstation: [id number]   CT Head Wo Contrast Result Date: 05/17/2024 EXAM: CT HEAD WITHOUT CONTRAST 05/17/2024 12:53:59 PM TECHNIQUE: CT of the head was performed without the administration of intravenous contrast. Automated exposure control, iterative reconstruction, and/or weight based adjustment of the mA/kV was utilized to reduce the radiation dose to as low as reasonably achievable. COMPARISON: CT of the head dated 04/25/2022. CLINICAL HISTORY: Head trauma, moderate-severe. FINDINGS: BRAIN AND VENTRICLES: No acute hemorrhage. No evidence of acute infarct. Partially calcified mass in anterior left frontal lobe with surrounding vasogenic edema remains compatible with arteriovenous malformation. Mass effect on anterior horn of left lateral ventricle. No hydrocephalus. No extra-axial collection. Atherosclerosis of skullbase vasculature without hyperdense vessel or abnormal calcification. ORBITS: No acute abnormality. SINUSES: Mucosal thickening in left maxillary sinus. SOFT TISSUES AND SKULL: No acute soft tissue abnormality. No skull fracture. IMPRESSION: 1. Partially calcified mass in anterior left frontal lobe with surrounding vasogenic edema, compatible with arteriovenous malformation, causing mass effect on anterior horn of left lateral ventricle. 2. Mucosal thickening in left maxillary sinus. Electronically signed by: Evalene Coho MD 05/17/2024 01:00 PM EST RP Workstation: [id number]   DG Hip Unilat With  Pelvis 2-3 Views Left Result Date: 05/17/2024 EXAM: 2 or 3 VIEW(S) XRAY OF THE LEFT HIP 05/17/2024 12:36:00 PM COMPARISON: None available. CLINICAL HISTORY: fall FINDINGS: BONES AND JOINTS: There is an intertrochanteric fracture of the left femur with mild varus angulation. The bony pelvis appears to be intact. No malalignment. SOFT TISSUES: The soft tissues are unremarkable. LUMBAR SPINE: There is diffuse chronic degenerative disc disease throughout the lower lumbar spine and there is levoscoliotic curvature of the lumbar spine. IMPRESSION: 1. Intertrochanteric fracture of the left femur with mild varus angulation. 2. Intact  bony pelvis. 3. Diffuse chronic degenerative disc disease throughout the lower lumbar spine and levoscoliotic curvature of the lumbar spine. Electronically signed by: Evalene Coho MD 05/17/2024 12:55 PM EST RP Workstation: [id number]   DG Chest Port 1 View Result Date: 05/17/2024 EXAM: 1 VIEW(S) XRAY OF THE CHEST 05/17/2024 12:37:26 PM COMPARISON: 09/27/2023 CLINICAL HISTORY: fall FINDINGS: LINES, TUBES AND DEVICES: Left chest cardiac pacing device noted. LUNGS AND PLEURA: Stable left retrocardiac opacity. Stable left pleural effusion. No pneumothorax. HEART AND MEDIASTINUM: Cardiomegaly, unchanged. Atherosclerotic calcifications noted. BONES AND SOFT TISSUES: Severe degenerative changes of left shoulder. IMPRESSION: 1. Stable left retrocardiac opacity and left pleural effusion. 2. Cardiomegaly, unchanged, with left chest cardiac pacing device in place. Electronically signed by: Evalene Coho MD 05/17/2024 12:53 PM EST RP Workstation: [id number]   DG ELBOW COMPLETE RIGHT (3+VIEW) Result Date: 05/17/2024 EXAM: 3 VIEW(S) XRAY OF THE RIGHT ELBOW COMPARISON: None available. CLINICAL HISTORY: [id number] Fall 190176 FINDINGS: BONES AND JOINTS: The bones appear osteopenic. There is mild chondrocalcinosis present within the radioulnar joint. There is enthesophyte formation along the  olecranon. No acute fracture. No malalignment. A true lateral view of the elbow was not obtained. SOFT TISSUES: The soft tissues are unremarkable. IMPRESSION: 1. No acute fracture. 2. Mild chondrocalcinosis within the radioulnar joint. 3. Enthesophyte formation along the olecranon. 4. Osteopenic bones. Electronically signed by: Evalene Coho MD 05/17/2024 12:52 PM EST RP Workstation: [id number]    Assessment/Plan  1. S/P ORIF (open reduction internal fixation) fracture (Primary) Slow to make progress  Will continue to work with therapy with goal towards returning to [facility] No current issues with pain management Remains on asa for Dvt prevention.   2. Acute urinary retention Saw urology and they recommended to continue the catheter for now On Flomax  Will trial again when mobility improves.   3. Primary hypertension Improved with hydralazine  scheduled bid  4. Cerebral AV malformation Long term On dilantin  and phenobarb for seizure prevention  5. Iron  deficiency anemia, unspecified iron  deficiency anemia type 05/26/24 Hgb 10.1 improved  6. Hypomagnesemia On supplementation.   7. Chronic diastolic CHF (congestive heart failure) (HCC) Weight trending up Give lasix  prn x 1   8. Pacemaker Noted.   9. Memory loss Oriented with very short term memory loss.   10. CKD BUN 56.6 Cr 1.19 05/26/24 Repeat 1 week due to elevated bun   Family/ staff Communication: resident and nurse.   Labs/tests ordered:  BMP 1 week       [1]  Allergies Allergen Reactions   Aleve [Naproxen] Other (See Comments)   Azithromycin Other (See Comments)    Reaction not recalled   Penicillins Hives and Other (See Comments)    Bad reaction as a child after a shot of Penicillin Tolerated Ancef  for orthopedic procedure 05/18/24   Pravastatin Other (See Comments)    Reaction not recalled   Sulfa Antibiotics Other (See Comments)   Sulfa Drugs Cross Reactors Other (See Comments)    Reaction not  recalled- stated she can take it in small doses   Tape Itching and Other (See Comments)    Cannot wear for an extended period of time   Naproxen Sodium Rash   "

## 2024-06-18 ENCOUNTER — Encounter: Payer: Self-pay | Admitting: Adult Health

## 2024-06-18 MED ORDER — HYDRALAZINE HCL 10 MG PO TABS: 10.0000 mg | ORAL_TABLET | Freq: Two times a day (BID) | ORAL | Status: AC

## 2024-06-25 ENCOUNTER — Ambulatory Visit (INDEPENDENT_AMBULATORY_CARE_PROVIDER_SITE_OTHER): Admitting: Student

## 2024-06-25 ENCOUNTER — Ambulatory Visit (HOSPITAL_BASED_OUTPATIENT_CLINIC_OR_DEPARTMENT_OTHER)

## 2024-06-25 DIAGNOSIS — S72142A Displaced intertrochanteric fracture of left femur, initial encounter for closed fracture: Secondary | ICD-10-CM

## 2024-06-25 NOTE — Progress Notes (Signed)
 "                                Post Operative Evaluation    Procedure/Date of Surgery: Left hip cephalomedullary nailing 05/18/2024  Interval History:   Patient presents today 5 weeks status post intertrochanteric left hip fracture with cephalomedullary nailing.  Overall she reports doing very well and states that she has no pain in her left hip.  She has been working with physical therapy at [facility] and is tolerating weightbearing.  She is quite happy with her outcome at this time.  She is having some pain on her right side particular within the hip and elbow also as a result of her fall.   PMH/PSH/Family History/Social History/Meds/Allergies:    Past Medical History:  Diagnosis Date   Abnormal cardiovascular stress test    Carotid arterial disease    MODERATE RIGHT   Cerebral AV malformation    DDD (degenerative disc disease)    SEVERE WITH SPINAL STENOSIS   Hypercholesterolemia    Hypertension    MVP (mitral valve prolapse)    MILD   Numbness    LEFT FOOT AND ANKLE   Osteopenia    Presence of permanent cardiac pacemaker    Pseudo-gout    Past Surgical History:  Procedure Laterality Date   APPENDECTOMY     BREAST MASS EXCISION     CARPAL TUNNEL RELEASE     CHOLECYSTECTOMY N/A 04/23/2022   Procedure: LAPAROSCOPIC CHOLECYSTECTOMY;  Surgeon: Vernetta Berg, MD;  Location: [facility];  Service: General;  Laterality: N/A;   gallbladder     INTRAMEDULLARY (IM) NAIL INTERTROCHANTERIC Left 05/18/2024   Procedure: INTERTROCHANTERIC FRACTURE FIXATION WITH INTRAMEDULLARY ROD;  Surgeon: Genelle Standing, MD;  Location: [facility];  Service: Orthopedics;  Laterality: Left;   PACEMAKER IMPLANT N/A 11/07/2021   Procedure: PACEMAKER IMPLANT;  Surgeon: Waddell Danelle ORN, MD;  Location: [facility];  Service: Cardiovascular;  Laterality: N/A;   REMOVAL OF PARATHYROID  GLAND     STRESS NUCLEAR STUDY     ABNORMAL   TONSILLECTOMY     Social History   Socioeconomic History    Marital status: Widowed    Spouse name: Not on file   Number of children: 3   Years of education: Not on file   Highest education level: Not on file  Occupational History   Not on file  Tobacco Use   Smoking status: Former    Current packs/day: 0.00    Average packs/day: 1 pack/day for 14.0 years (14.0 ttl pk-yrs)    Types: Cigarettes    Start date: 11/08/1954    Quit date: 11/07/1968    Years since quitting: 55.6   Smokeless tobacco: Never  Vaping Use   Vaping status: Never Used  Substance and Sexual Activity   Alcohol  use: No   Drug use: No   Sexual activity: Not on file  Other Topics Concern   Not on file  Social History Narrative   [place] [employer]    Right handed    Social Drivers of Health   Tobacco Use: Medium Risk (06/18/2024)   Patient History    Smoking Tobacco Use: Former    Smokeless Tobacco Use: Never    Passive Exposure: Not on Actuary Strain: Not on file  Food Insecurity: No Food Insecurity (05/17/2024)   Epic    Worried About Radiation Protection Practitioner of Food in the Last  Year: Never true    Ran Out of Food in the Last Year: Never true  Transportation Needs: No Transportation Needs (05/17/2024)   Epic    Lack of Transportation (Medical): No    Lack of Transportation (Non-Medical): No  Physical Activity: Not on file  Stress: Not on file  Social Connections: Moderately Integrated (05/17/2024)   Social Connection and Isolation Panel    Frequency of Communication with Friends and Family: Twice a week    Frequency of Social Gatherings with Friends and Family: Twice a week    Attends Religious Services: More than 4 times per year    Active Member of [employer] or Organizations: No    Attends Banker Meetings: 1 to 4 times per year    Marital Status: Widowed  Depression (PHQ2-9): Low Risk (08/13/2023)   Depression (PHQ2-9)    PHQ-2 Score: 0  Alcohol  Screen: Low Risk (08/05/2023)   Alcohol  Screen    Last Alcohol  Screening Score (AUDIT):  0  Housing: Low Risk (05/17/2024)   Epic    Unable to Pay for Housing in the Last Year: No    Number of Times Moved in the Last Year: 0    Homeless in the Last Year: No  Utilities: Not At Risk (05/17/2024)   Epic    Threatened with loss of utilities: No  Health Literacy: Not on file   Family History  Problem Relation Age of Onset   Stroke Mother    Lung cancer Father    Pneumonia Brother    Breast cancer Neg Hx    Allergies[1] Current Outpatient Medications  Medication Sig Dispense Refill   acetaminophen  (TYLENOL ) 500 MG tablet Take 1 tablet (500 mg total) by mouth every 6 (six) hours as needed for mild pain (pain score 1-3) or headache. 30 tablet 0   amLODipine  (NORVASC ) 5 MG tablet Take 1 tablet (5 mg total) by mouth daily.     aspirin  325 MG tablet Take 1 tablet (325 mg total) by mouth daily. 30 tablet 0   b complex vitamins capsule Take 1 capsule by mouth daily.     bisacodyl  (DULCOLAX) 5 MG EC tablet Take 1 tablet (5 mg total) by mouth daily as needed for moderate constipation.     carbamide peroxide (DEBROX) 6.5 % OTIC solution Place 3 drops into both ears as directed. Instill 3 drop in both ears at bedtime for wax removal. until 06/17/2024 23:59 QHS in both ears, then perform ear lavage with warm water  and peroxide the following morning     docusate sodium  (COLACE) 100 MG capsule Take 1 capsule (100 mg total) by mouth 2 (two) times daily. 60 capsule 0   fluticasone  (FLONASE ) 50 MCG/ACT nasal spray Place 1-2 sprays into both nostrils daily.     furosemide  (LASIX ) 20 MG tablet Take 20 mg by mouth. Give 20 mg by mouth every 24 hours as needed for 5lb weight gain     guaiFENesin -dextromethorphan (ROBITUSSIN DM) 100-10 MG/5ML syrup Take 10 mLs by mouth every 6 (six) hours as needed for cough. 118 mL 0   hydrALAZINE  (APRESOLINE ) 10 MG tablet Take 1 tablet (10 mg total) by mouth in the morning and at bedtime. Sbp >170     ipratropium-albuterol  (DUONEB) 0.5-2.5 (3) MG/3ML SOLN Take  3 mLs by nebulization every 6 (six) hours as needed (SOB/Wheezing).     Iron , Ferrous Sulfate , 325 (65 Fe) MG TABS Take 325 mg by mouth 3 (three) times a week.  loperamide  (IMODIUM  A-D) 2 MG tablet Take 2 mg by mouth every 8 (eight) hours as needed for diarrhea or loose stools.     magnesium  oxide (MAG-OX) 400 (240 Mg) MG tablet Take 400 mg by mouth 2 (two) times daily.     Nutritional Supplements (BOOST ORIGINAL PO) Take by mouth as directed. Give 1 can orally one two times a day     ondansetron  (ZOFRAN -ODT) 4 MG disintegrating tablet Take 2 mg by mouth every 4 (four) hours as needed for nausea or vomiting.     oxyCODONE  (ROXICODONE ) 5 MG immediate release tablet Take 1 tablet (5 mg total) by mouth every 4 (four) hours as needed for severe pain (pain score 7-10) or breakthrough pain. 30 tablet 0   PHENobarbital  (LUMINAL) 64.8 MG tablet Take 1 tablet (64.8 mg total) by mouth at bedtime.     phenytoin  (DILANTIN ) 100 MG ER capsule Take 2 capsules (200 mg total) by mouth daily.     Polyethyl Glycol-Propyl Glycol (SYSTANE) 0.4-0.3 % SOLN Place 1 drop into both eyes in the morning and at bedtime.     saccharomyces boulardii (FLORASTOR) 250 MG capsule Take 250 mg by mouth 2 (two) times daily. Give 1 capsule by mouth two times a day for Probiotic for 14 Days     tamsulosin  (FLOMAX ) 0.4 MG CAPS capsule Take 1 capsule (0.4 mg total) by mouth daily after supper. 30 capsule 0   torsemide  (DEMADEX ) 10 MG tablet Take 1 tablet (10 mg total) by mouth 3 (three) times a week.     triamcinolone  cream (KENALOG ) 0.1 % Apply 1 Application topically 2 (two) times daily as needed.     valsartan  (DIOVAN ) 160 MG tablet Take 1 tablet (160 mg total) by mouth 2 (two) times daily. 60 tablet 0   No current facility-administered medications for this visit.   No results found.  Review of Systems:   A ROS was performed including pertinent positives and negatives as documented in the HPI.   Musculoskeletal Exam:     There were no vitals taken for this visit.  Left hip incisions are well-appearing without evidence of erythema or drainage.  Passive left hip range of motion is to 110 degrees flexion and 20 degrees of internal and external rotation.  Distal neurosensory exam is intact.  Imaging:    Xray (left femur 2 views): Left femur IM nail in good positioning with evidence of increased peripheral callus formation   I personally reviewed and interpreted the radiographs.   Assessment:   Patient is almost 6 weeks status post left hip IM nailing for a displaced intertrochanteric hip fracture sustained during a ground-level fall.  She has been progressing extremely well and today reports no pain or issues within the hip.  She has been able to tolerate weightbearing on physical therapy.  She is not yet to walking long distances or standing long periods of time.  X-rays today show no hardware complications with continued bony healing.  Given how well she is doing, I believe she can plan to follow-up on an as-needed basis, and we can also investigate more of her right sided issues if these continue to persist and be limiting.  Plan :    - Return to clinic as needed      I personally saw and evaluated the patient, and participated in the management and treatment plan.  Leonce Reveal, PA-C Orthopedics     [1]  Allergies Allergen Reactions   Aleve [Naproxen] Other (See Comments)  Azithromycin Other (See Comments)    Reaction not recalled   Penicillins Hives and Other (See Comments)    Bad reaction as a child after a shot of Penicillin Tolerated Ancef  for orthopedic procedure 05/18/24   Pravastatin Other (See Comments)    Reaction not recalled   Sulfa Antibiotics Other (See Comments)   Sulfa Drugs Cross Reactors Other (See Comments)    Reaction not recalled- stated she can take it in small doses   Tape Itching and Other (See Comments)    Cannot wear for an extended period of time    Naproxen Sodium Rash   "

## 2024-07-17 ENCOUNTER — Other Ambulatory Visit: Payer: Self-pay | Admitting: Adult Health

## 2024-07-17 MED ORDER — PHENOBARBITAL 64.8 MG PO TABS: 64.8000 mg | ORAL_TABLET | Freq: Every day | ORAL | 5 refills | Status: AC

## 2024-08-05 ENCOUNTER — Ambulatory Visit: Payer: Medicare Other

## 2024-08-12 ENCOUNTER — Ambulatory Visit

## 2024-08-25 ENCOUNTER — Other Ambulatory Visit (HOSPITAL_BASED_OUTPATIENT_CLINIC_OR_DEPARTMENT_OTHER)

## 2024-11-11 ENCOUNTER — Ambulatory Visit

## 2025-02-10 ENCOUNTER — Ambulatory Visit

## 2025-05-12 ENCOUNTER — Ambulatory Visit

## 2025-08-11 ENCOUNTER — Ambulatory Visit
